# Patient Record
Sex: Male | Born: 1945 | Race: White | Hispanic: No | Marital: Married | Smoking: Current every day smoker
Health system: Southern US, Community
[De-identification: ages and names within clinical notes are randomized; demographics above are authoritative.]

## PROBLEM LIST (undated history)

## (undated) DIAGNOSIS — K219 Gastro-esophageal reflux disease without esophagitis: Secondary | ICD-10-CM

## (undated) DIAGNOSIS — J449 Chronic obstructive pulmonary disease, unspecified: Secondary | ICD-10-CM

## (undated) DIAGNOSIS — C61 Malignant neoplasm of prostate: Secondary | ICD-10-CM

## (undated) DIAGNOSIS — M1990 Unspecified osteoarthritis, unspecified site: Secondary | ICD-10-CM

## (undated) DIAGNOSIS — R892 Abnormal level of other drugs, medicaments and biological substances in specimens from other organs, systems and tissues: Secondary | ICD-10-CM

## (undated) DIAGNOSIS — F17200 Nicotine dependence, unspecified, uncomplicated: Secondary | ICD-10-CM

## (undated) DIAGNOSIS — E785 Hyperlipidemia, unspecified: Secondary | ICD-10-CM

## (undated) DIAGNOSIS — M792 Neuralgia and neuritis, unspecified: Secondary | ICD-10-CM

## (undated) DIAGNOSIS — M5090 Cervical disc disorder, unspecified, unspecified cervical region: Secondary | ICD-10-CM

## (undated) DIAGNOSIS — F329 Major depressive disorder, single episode, unspecified: Secondary | ICD-10-CM

## (undated) DIAGNOSIS — E119 Type 2 diabetes mellitus without complications: Secondary | ICD-10-CM

## (undated) DIAGNOSIS — N529 Male erectile dysfunction, unspecified: Secondary | ICD-10-CM

## (undated) DIAGNOSIS — G4733 Obstructive sleep apnea (adult) (pediatric): Secondary | ICD-10-CM

## (undated) DIAGNOSIS — E1143 Type 2 diabetes mellitus with diabetic autonomic (poly)neuropathy: Secondary | ICD-10-CM

## (undated) DIAGNOSIS — I480 Paroxysmal atrial fibrillation: Secondary | ICD-10-CM

## (undated) DIAGNOSIS — I714 Abdominal aortic aneurysm, without rupture: Secondary | ICD-10-CM

## (undated) DIAGNOSIS — S4991XA Unspecified injury of right shoulder and upper arm, initial encounter: Secondary | ICD-10-CM

## (undated) DIAGNOSIS — E669 Obesity, unspecified: Secondary | ICD-10-CM

## (undated) DIAGNOSIS — M62838 Other muscle spasm: Secondary | ICD-10-CM

## (undated) DIAGNOSIS — F32A Depression, unspecified: Secondary | ICD-10-CM

## (undated) DIAGNOSIS — M7022 Olecranon bursitis, left elbow: Secondary | ICD-10-CM

## (undated) DIAGNOSIS — K760 Fatty (change of) liver, not elsewhere classified: Secondary | ICD-10-CM

## (undated) DIAGNOSIS — R252 Cramp and spasm: Secondary | ICD-10-CM

## (undated) HISTORY — DX: Cramp and spasm: R25.2

## (undated) HISTORY — DX: Unspecified osteoarthritis, unspecified site: M19.90

## (undated) HISTORY — DX: Unspecified injury of right shoulder and upper arm, initial encounter: S49.91XA

## (undated) HISTORY — DX: Neuralgia and neuritis, unspecified: M79.2

## (undated) HISTORY — DX: Cervical disc disorder, unspecified, unspecified cervical region: M50.90

## (undated) HISTORY — DX: Hyperlipidemia, unspecified: E78.5

## (undated) HISTORY — PX: KNEE SURGERY: SHX244

## (undated) HISTORY — DX: Type 2 diabetes mellitus without complications: E11.9

## (undated) HISTORY — DX: Type 2 diabetes mellitus with diabetic autonomic (poly)neuropathy: E11.43

## (undated) HISTORY — DX: Male erectile dysfunction, unspecified: N52.9

## (undated) HISTORY — DX: Gastro-esophageal reflux disease without esophagitis: K21.9

## (undated) HISTORY — DX: Other muscle spasm: M62.838

## (undated) HISTORY — DX: Malignant neoplasm of prostate: C61

## (undated) HISTORY — DX: Obstructive sleep apnea (adult) (pediatric): G47.33

## (undated) HISTORY — DX: Olecranon bursitis, left elbow: M70.22

## (undated) HISTORY — DX: Obesity, unspecified: E66.9

## (undated) HISTORY — DX: Chronic obstructive pulmonary disease, unspecified: J44.9

## (undated) HISTORY — DX: Paroxysmal atrial fibrillation: I48.0

## (undated) HISTORY — DX: Fatty (change of) liver, not elsewhere classified: K76.0

## (undated) HISTORY — DX: Abnormal level of other drugs, medicaments and biological substances in specimens from other organs, systems and tissues: R89.2

## (undated) HISTORY — DX: Abdominal aortic aneurysm, without rupture: I71.4

## (undated) HISTORY — DX: Nicotine dependence, unspecified, uncomplicated: F17.200

## (undated) HISTORY — PX: TONSILLECTOMY: SUR1361

---

## 1999-12-09 HISTORY — PX: CHOLECYSTECTOMY: SHX55

## 2008-04-02 ENCOUNTER — Other Ambulatory Visit: Payer: Self-pay

## 2008-04-02 ENCOUNTER — Emergency Department: Payer: Self-pay | Admitting: Emergency Medicine

## 2010-03-21 ENCOUNTER — Encounter (INDEPENDENT_AMBULATORY_CARE_PROVIDER_SITE_OTHER): Payer: Self-pay | Admitting: Family Medicine

## 2010-03-21 ENCOUNTER — Encounter: Payer: Self-pay | Admitting: Family Medicine

## 2010-03-21 ENCOUNTER — Ambulatory Visit: Payer: Self-pay | Admitting: Family Medicine

## 2010-03-21 DIAGNOSIS — J449 Chronic obstructive pulmonary disease, unspecified: Secondary | ICD-10-CM | POA: Insufficient documentation

## 2010-03-22 ENCOUNTER — Encounter (INDEPENDENT_AMBULATORY_CARE_PROVIDER_SITE_OTHER): Payer: Self-pay | Admitting: Family Medicine

## 2010-03-28 ENCOUNTER — Encounter: Payer: Self-pay | Admitting: Family Medicine

## 2010-03-28 ENCOUNTER — Ambulatory Visit: Payer: Self-pay | Admitting: Family Medicine

## 2010-03-28 DIAGNOSIS — Z720 Tobacco use: Secondary | ICD-10-CM

## 2010-03-28 DIAGNOSIS — Z87448 Personal history of other diseases of urinary system: Secondary | ICD-10-CM | POA: Insufficient documentation

## 2010-03-28 LAB — CONVERTED CEMR LAB
Hemoglobin, Urine: NEGATIVE
Leukocytes, UA: NEGATIVE
Nitrite: NEGATIVE
Protein, ur: NEGATIVE mg/dL
pH: 5.5 (ref 5.0–8.0)

## 2010-03-30 ENCOUNTER — Telehealth: Payer: Self-pay | Admitting: Family Medicine

## 2010-04-21 ENCOUNTER — Ambulatory Visit: Payer: Self-pay | Admitting: Family Medicine

## 2010-04-21 DIAGNOSIS — R252 Cramp and spasm: Secondary | ICD-10-CM

## 2010-04-21 DIAGNOSIS — K760 Fatty (change of) liver, not elsewhere classified: Secondary | ICD-10-CM

## 2010-04-21 DIAGNOSIS — K219 Gastro-esophageal reflux disease without esophagitis: Secondary | ICD-10-CM | POA: Insufficient documentation

## 2010-04-22 ENCOUNTER — Telehealth (INDEPENDENT_AMBULATORY_CARE_PROVIDER_SITE_OTHER): Payer: Self-pay | Admitting: *Deleted

## 2010-05-03 ENCOUNTER — Ambulatory Visit: Payer: Self-pay | Admitting: Family Medicine

## 2010-05-16 LAB — CONVERTED CEMR LAB
ALT: 16 units/L (ref 0–53)
AST: 17 units/L (ref 0–37)
CO2: 24 meq/L (ref 19–32)
Calcium: 9.6 mg/dL (ref 8.4–10.5)
Chloride: 107 meq/L (ref 96–112)
Creatinine, Ser: 0.88 mg/dL (ref 0.40–1.50)
Eosinophils Absolute: 0.3 10*3/uL (ref 0.0–0.7)
Lymphocytes Relative: 19 % (ref 12–46)
Lymphs Abs: 2.7 10*3/uL (ref 0.7–4.0)
Monocytes Relative: 6 % (ref 3–12)
Neutro Abs: 10.2 10*3/uL — ABNORMAL HIGH (ref 1.7–7.7)
Neutrophils Relative %: 73 % (ref 43–77)
PSA: 2.23 ng/mL (ref 0.10–4.00)
Platelets: 248 10*3/uL (ref 150–400)
Potassium: 4.5 meq/L (ref 3.5–5.3)
RBC: 5.55 M/uL (ref 4.22–5.81)
Sodium: 140 meq/L (ref 135–145)
Total CHOL/HDL Ratio: 5.1
Total Protein: 6.2 g/dL (ref 6.0–8.3)
WBC: 13.9 10*3/uL — ABNORMAL HIGH (ref 4.0–10.5)

## 2010-07-14 ENCOUNTER — Ambulatory Visit: Payer: Self-pay | Admitting: Family Medicine

## 2010-07-14 DIAGNOSIS — T310 Burns involving less than 10% of body surface: Secondary | ICD-10-CM | POA: Insufficient documentation

## 2010-09-18 ENCOUNTER — Ambulatory Visit: Payer: Self-pay | Admitting: Family Medicine

## 2010-09-18 ENCOUNTER — Encounter: Payer: Self-pay | Admitting: Cardiovascular Disease

## 2010-09-18 DIAGNOSIS — I4891 Unspecified atrial fibrillation: Secondary | ICD-10-CM | POA: Insufficient documentation

## 2010-09-18 DIAGNOSIS — R209 Unspecified disturbances of skin sensation: Secondary | ICD-10-CM | POA: Insufficient documentation

## 2010-09-19 ENCOUNTER — Ambulatory Visit: Payer: Self-pay | Admitting: Cardiovascular Disease

## 2010-09-19 DIAGNOSIS — E1169 Type 2 diabetes mellitus with other specified complication: Secondary | ICD-10-CM | POA: Insufficient documentation

## 2010-09-19 DIAGNOSIS — E785 Hyperlipidemia, unspecified: Secondary | ICD-10-CM | POA: Insufficient documentation

## 2010-09-19 DIAGNOSIS — R9431 Abnormal electrocardiogram [ECG] [EKG]: Secondary | ICD-10-CM | POA: Insufficient documentation

## 2010-09-20 ENCOUNTER — Encounter: Payer: Self-pay | Admitting: Family Medicine

## 2010-09-24 ENCOUNTER — Telehealth (INDEPENDENT_AMBULATORY_CARE_PROVIDER_SITE_OTHER): Payer: Self-pay | Admitting: Internal Medicine

## 2010-09-25 ENCOUNTER — Telehealth: Payer: Self-pay | Admitting: Family Medicine

## 2010-10-12 ENCOUNTER — Telehealth: Payer: Self-pay | Admitting: Family Medicine

## 2010-10-14 ENCOUNTER — Telehealth: Payer: Self-pay | Admitting: Cardiovascular Disease

## 2010-10-15 ENCOUNTER — Encounter: Payer: Self-pay | Admitting: Cardiovascular Disease

## 2010-10-16 ENCOUNTER — Telehealth: Payer: Self-pay | Admitting: Cardiology

## 2010-10-17 ENCOUNTER — Ambulatory Visit: Payer: Self-pay | Admitting: Family Medicine

## 2010-10-17 DIAGNOSIS — J984 Other disorders of lung: Secondary | ICD-10-CM | POA: Insufficient documentation

## 2010-10-17 DIAGNOSIS — I10 Essential (primary) hypertension: Secondary | ICD-10-CM

## 2010-10-17 DIAGNOSIS — G4700 Insomnia, unspecified: Secondary | ICD-10-CM | POA: Insufficient documentation

## 2010-10-18 ENCOUNTER — Telehealth (INDEPENDENT_AMBULATORY_CARE_PROVIDER_SITE_OTHER): Payer: Self-pay | Admitting: *Deleted

## 2010-10-21 ENCOUNTER — Encounter: Payer: Self-pay | Admitting: Cardiovascular Disease

## 2010-10-22 ENCOUNTER — Telehealth: Payer: Self-pay | Admitting: Cardiovascular Disease

## 2010-10-29 ENCOUNTER — Ambulatory Visit: Payer: Self-pay | Admitting: Family Medicine

## 2010-11-05 ENCOUNTER — Encounter: Payer: Self-pay | Admitting: Family Medicine

## 2010-11-06 ENCOUNTER — Ambulatory Visit: Payer: Self-pay | Admitting: Cardiovascular Disease

## 2010-11-06 ENCOUNTER — Telehealth: Payer: Self-pay | Admitting: Cardiovascular Disease

## 2010-11-06 ENCOUNTER — Ambulatory Visit: Payer: Self-pay | Admitting: Family Medicine

## 2010-11-11 ENCOUNTER — Telehealth: Payer: Self-pay | Admitting: Cardiovascular Disease

## 2010-11-15 ENCOUNTER — Ambulatory Visit: Payer: Self-pay | Admitting: Family Medicine

## 2010-11-18 ENCOUNTER — Telehealth: Payer: Self-pay | Admitting: Cardiovascular Disease

## 2010-11-21 ENCOUNTER — Telehealth (INDEPENDENT_AMBULATORY_CARE_PROVIDER_SITE_OTHER): Payer: Self-pay | Admitting: *Deleted

## 2010-11-25 ENCOUNTER — Encounter: Payer: Self-pay | Admitting: Cardiovascular Disease

## 2010-11-28 ENCOUNTER — Ambulatory Visit: Payer: Self-pay | Admitting: Family Medicine

## 2010-11-28 ENCOUNTER — Encounter: Payer: Self-pay | Admitting: Cardiovascular Disease

## 2010-11-28 ENCOUNTER — Ambulatory Visit: Payer: Self-pay | Admitting: Cardiovascular Disease

## 2010-11-28 DIAGNOSIS — Z9119 Patient's noncompliance with other medical treatment and regimen: Secondary | ICD-10-CM

## 2010-11-29 ENCOUNTER — Encounter: Payer: Self-pay | Admitting: Family Medicine

## 2010-11-29 ENCOUNTER — Encounter: Payer: Self-pay | Admitting: Cardiovascular Disease

## 2010-11-29 ENCOUNTER — Telehealth (INDEPENDENT_AMBULATORY_CARE_PROVIDER_SITE_OTHER): Payer: Self-pay | Admitting: *Deleted

## 2010-11-29 ENCOUNTER — Ambulatory Visit: Payer: Self-pay | Admitting: Family Medicine

## 2010-11-30 ENCOUNTER — Encounter: Payer: Self-pay | Admitting: Family Medicine

## 2010-11-30 LAB — CONVERTED CEMR LAB
ALT: 19 units/L (ref 0–53)
AST: 18 units/L (ref 0–37)
Basophils Absolute: 0 10*3/uL (ref 0.0–0.1)
Basophils Relative: 0 % (ref 0–1)
Chloride: 103 meq/L (ref 96–112)
Creatinine, Ser: 1.01 mg/dL (ref 0.40–1.50)
Eosinophils Absolute: 0.4 10*3/uL (ref 0.0–0.7)
Eosinophils Relative: 3 % (ref 0–5)
HCT: 47.9 % (ref 39.0–52.0)
MCV: 91.9 fL (ref 78.0–100.0)
Platelets: 237 10*3/uL (ref 150–400)
RDW: 13.2 % (ref 11.5–15.5)
Sed Rate: 1 mm/hr (ref 0–16)
Sodium: 140 meq/L (ref 135–145)
Total Bilirubin: 0.5 mg/dL (ref 0.3–1.2)

## 2010-12-04 ENCOUNTER — Encounter: Payer: Self-pay | Admitting: Family Medicine

## 2010-12-04 ENCOUNTER — Ambulatory Visit
Admission: RE | Admit: 2010-12-04 | Discharge: 2010-12-04 | Payer: Self-pay | Source: Home / Self Care | Attending: Family Medicine | Admitting: Family Medicine

## 2010-12-04 DIAGNOSIS — D72829 Elevated white blood cell count, unspecified: Secondary | ICD-10-CM | POA: Insufficient documentation

## 2010-12-05 ENCOUNTER — Telehealth: Payer: Self-pay | Admitting: Family Medicine

## 2010-12-08 HISTORY — PX: OTHER SURGICAL HISTORY: SHX169

## 2010-12-09 ENCOUNTER — Telehealth: Payer: Self-pay | Admitting: Adult Health

## 2010-12-23 ENCOUNTER — Ambulatory Visit
Admission: RE | Admit: 2010-12-23 | Discharge: 2010-12-23 | Payer: Self-pay | Source: Home / Self Care | Attending: Family Medicine | Admitting: Family Medicine

## 2010-12-23 ENCOUNTER — Encounter: Payer: Self-pay | Admitting: Family Medicine

## 2010-12-23 ENCOUNTER — Encounter (INDEPENDENT_AMBULATORY_CARE_PROVIDER_SITE_OTHER): Payer: Self-pay | Admitting: *Deleted

## 2010-12-30 ENCOUNTER — Encounter: Payer: Self-pay | Admitting: Family Medicine

## 2010-12-30 ENCOUNTER — Ambulatory Visit
Admission: RE | Admit: 2010-12-30 | Discharge: 2010-12-30 | Payer: Self-pay | Source: Home / Self Care | Attending: Family Medicine | Admitting: Family Medicine

## 2010-12-30 ENCOUNTER — Ambulatory Visit
Admission: RE | Admit: 2010-12-30 | Discharge: 2010-12-30 | Payer: Self-pay | Source: Home / Self Care | Attending: Cardiology | Admitting: Cardiology

## 2010-12-30 ENCOUNTER — Encounter: Payer: Self-pay | Admitting: Cardiology

## 2010-12-30 DIAGNOSIS — R0609 Other forms of dyspnea: Secondary | ICD-10-CM | POA: Insufficient documentation

## 2010-12-31 ENCOUNTER — Ambulatory Visit: Admission: RE | Admit: 2010-12-31 | Discharge: 2010-12-31 | Payer: Self-pay | Source: Home / Self Care

## 2010-12-31 ENCOUNTER — Other Ambulatory Visit: Payer: Self-pay | Admitting: Cardiovascular Disease

## 2011-01-01 ENCOUNTER — Telehealth (INDEPENDENT_AMBULATORY_CARE_PROVIDER_SITE_OTHER): Payer: Self-pay

## 2011-01-06 ENCOUNTER — Telehealth (INDEPENDENT_AMBULATORY_CARE_PROVIDER_SITE_OTHER): Payer: Self-pay | Admitting: Radiology

## 2011-01-06 LAB — PULMONARY FUNCTION TEST

## 2011-01-07 ENCOUNTER — Encounter (HOSPITAL_COMMUNITY)
Admission: RE | Admit: 2011-01-07 | Discharge: 2011-01-07 | Payer: Self-pay | Source: Home / Self Care | Attending: Cardiology | Admitting: Cardiology

## 2011-01-07 ENCOUNTER — Telehealth: Payer: Self-pay | Admitting: Cardiovascular Disease

## 2011-01-07 ENCOUNTER — Ambulatory Visit
Admission: RE | Admit: 2011-01-07 | Discharge: 2011-01-07 | Payer: Self-pay | Source: Home / Self Care | Attending: Cardiology | Admitting: Cardiology

## 2011-01-07 ENCOUNTER — Encounter: Payer: Self-pay | Admitting: Cardiology

## 2011-01-07 NOTE — Progress Notes (Signed)
Summary: refill soma and doxycyline  Phone Note Call from Patient   Caller: Patient Call For: Dr. Laural Benes Reason for Call: Refill Medication Summary of Call: Called from [employer] Number; [phone] Wants refill of Carisoprodol 350 mg 60 at a time// and doxycyline 100 mg gets #20 and combivent inhaler Initial call taken by: Providence Crosby LPN,  October 12, 2010 11:36 AM  Follow-up for Phone Call        Please call patient to find out why he is calling for these medications and not being seen for an office visit.  I don't normally call in any prescriptions for antibiotics or controlled substances without the patient having been seen. We can call in the combivent for him but he would need to be seen for the others.  I had told the patient that I didn't feel comfortable prescribing him Tresa Garter as an ongoing prescription.  He will need to find another clinic if he is expecting that we will keep prescribing these controlled substances. [name], M.D., F.A.A.F.P.  October 12, 2010   Additional Follow-up for Phone Call Additional follow up Details #1::        I Called patient and let him know he had to come in to be seen for abx. and controlled medication refills  States I do not want to see that doctor I will call Monday and get it done with my doctor!!! Additional Follow-up by: Providence Crosby LPN,  October 12, 2010 12:22 PM    Pt should have at least 3 refills remaining on his combivent prescription.  On 09/18/10 He was prescribed 4 refills.  If he needs further antibiotics, I strongly recommend that he come in for an office visit evaluation.  In addition, for a prescription for Soma, He needs to be evaluated for the reason that he needs to be taking this medication regularly.  We have no records or diagnostic tests to support him using this medication chronically.  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

## 2011-01-07 NOTE — Progress Notes (Signed)
Summary: EVENT MONITOR  Phone Note Call from Patient Call back at Home Phone [phone] Call back at [phone]   Caller: WIFE ([name]) Call For: GOLLAN Summary of Call: CALLING ABOUT GETTING AN EVENT MONITOR FOR THE PT-STATES THAT SHE SPOKE WITH SOMEONE LAST WEEK REGARDING THIS AND HAS NOT HEARD ANYTHING BACK-I DO NOT SEE A PHONE NOTE ON THIS-PT HAS MEDICARE ONLY Initial call taken by: Harlon Flor,  October 14, 2010 10:26 AM  Follow-up for Phone Call        Called spoke with pt's wife, advised event monitor has been ordered on 10/07/10, still pending approval in [employer].  Follow-up by: Cloyde Reams RN,  October 14, 2010 11:47 AM

## 2011-01-07 NOTE — Letter (Signed)
Summary: Handout Printed  Printed Handout:  - Tobacco Quit Plan

## 2011-01-07 NOTE — Progress Notes (Signed)
Summary: Afib with RVR  Phone Note Outgoing Call Call back at [phone]   Call placed by: Cloyde Reams RN,  October 22, 2010 5:01 PM Call placed to: Patient Summary of Call: Attempted to call pt had afib with RVR on 10/19/10, [employer] faxed transmissions.  Pt was started on Diltiazem 120mg  once daily on 10/16/10 for afib with RVR.  Per EMR note on 10/17/10 pt stated he was not going to take the Diltiazem, but his primary MD encouraged him to follow Dr Alford Highland recommendations.  Unsure if pt is taking rx Diltiazem or not.  Attempted to call pt to inquire.  LMOM TCB.  Initial call taken by: Cloyde Reams RN,  October 22, 2010 5:06 PM  Follow-up for Phone Call        Called spoke with pt.  Pt states he is taking the Diltiazem 120mg  daily and he has not missed any dosages.  Pt states on Saturday he knew his HR was elevated, because he was diaphoretic for no reason.  Follow-up by: Cloyde Reams RN,  October 23, 2010 4:06 PM  Additional Follow-up for Phone Call Additional follow up Details #1::        Pt called back, [employer] monitor broke and pt is in [place] at this time.  He will return on Tuesday 10/29/10, [employer] is mailing out another monitor and pt will resume wearing once he gets back to town and can put the new monitor back on.  Additional Follow-up by: Cloyde Reams RN,  October 24, 2010 10:33 AM

## 2011-01-07 NOTE — Progress Notes (Signed)
  Phone Note Call from Patient   Reason for Call: Refill Medication Action Taken: Phone Call Completed Details of Complaint: persistent "cold" Summary of Call: Patient wants a refill of doxycycline but the present doxy day 7 of 10, doesn't seem to be working. I offered to see the patient today. patient declined. Says he's going out on a road trip later today and will consider calling Dr. Laural Benes tomorrow.

## 2011-01-07 NOTE — Assessment & Plan Note (Signed)
Vital Signs:  Patient Profile:   65 Years Old Male CC:      Wheezing and right side pain./ RWT Height:     72.5 inches Weight:      267 pounds BMI:     35.84 Temp:     98.0 degrees F oral Pulse rate:   74 / minute Pulse rhythm:   regular Resp:     22 per minute BP sitting:   110 / 78  (right arm)  Vitals Entered By: Levonne Spiller EMT-P (March 21, 2010 11:48 AM)             Is Patient Diabetic? No Comments Pt. Is a smoker.1 pack per day.      Current Allergies: No known allergies History of Present Illness Chief Complaint: Wheezing and right side pain./ RWT History of Present Illness: Patient presents today with complaints of pain on his right side since GB surgery 4 years ago. The pain is described as soreness or tightning. Notices it with deep breaths, drinking through a straw and while cigarette smoking. Sometimes felt when he turns or moves quickly. Sometimes felt with exhertion. He has a chronic cough sometimes of green sputum. No f/c or nightsweats. + snoring, no witnessed apnea.  He has no regular doctor and gets no regular care. He was working in the garden yesterday and became short of breath and had soreness. Used his inhaler that he had from years ago and felt better. He reports previous dx with emphysema. No history of known heart dz.   REVIEW OF SYSTEMS Constitutional Symptoms       Complains of fatigue.     Denies fever, chills, night sweats, weight loss, and weight gain.  Eyes       Complains of glasses.      Denies change in vision, eye pain, eye discharge, and contact lenses.      Comments: reading glasses Ear/Nose/Throat/Mouth       Complains of sinus problems.      Denies hearing loss/aids, change in hearing, ear pain, ear discharge, dizziness, frequent runny nose, and frequent nose bleeds.  Respiratory       Complains of productive cough, wheezing, shortness of breath, and emphysema/COPD.      Denies dry cough, asthma, and bronchitis.  Cardiovascular    Complains of chest pain and tires easily with exhertion.      Denies murmurs.    Gastrointestinal       Complains of stomach pain.      Denies nausea/vomiting, diarrhea, constipation, and blood in bowel movements. Genitourniary       Denies painful urination, blood or discharge from penis, kidney stones, and loss of urinary control. Neurological       Denies paralysis, seizures, and fainting/blackouts. Musculoskeletal       Complains of joint stiffness.      Denies joint pain, decreased range of motion, redness, swelling, muscle weakness, and gout.      Comments: + cramping  Psych       Complains of sleep problems. Other Comments: urinary urgency, nocturia, no hesitancy, dribbling or decreased stream.   Past History:  Past Surgical History: Knee surgery Cholecystectomy Tonsillectomy  Family History: Father died of Leukemia had heart and lung probs but never smoked or drank sister died of melanoma  2 brothers of bad health  Social History: Married Current Smoker Occupation: Long distance Naval architect Smoking Status:  current Physical Exam General appearance: obese, well developed, well nourished, no acute  distress Head: normocephalic, atraumatic Oral/Pharynx: tongue normal, posterior pharynx without erythema or exudate Neck: neck supple,  trachea midline, no masses Chest/Lungs: coarse bs throughout with wheezes on right - improved aeration after nebulizer Heart: regular rate and  rhythm, no murmur Abdomen: protuberant, NABS, slightly enlarged liver. +tender to palp along right lower rib cage along axillary line with some swelling in the area of pain, no ecchymosis Extremities: 1+ to trace pitting edema bilateral legs to knees.  Neurological: grossly intact and non-focal Back: no tenderness over musculature, straight leg raises negative bilaterally, deep tendon reflexes 2+ at achilles and patella Skin: no obvious rashes or lesions Assessment New Problems: FECAL URGENCY  (ICD-787.63) URINARY URGENCY (ICD-788.63) FLANK PAIN, RIGHT (ICD-789.09) COPD (ICD-496)   Plan New Medications/Changes: PREDNISONE (PAK) 10 MG TABS (PREDNISONE) as directed  #1 x 0, 03/21/2010, Tacey Ruiz MD COMBIVENT 18-103 MCG/ACT AERO (IPRATROPIUM-ALBUTEROL) 2 puffs by mouth three times a day as needed shortness of breath/wheezing  #1 x 3, 03/21/2010, Tacey Ruiz MD  New Orders: New Patient Level III [99203] T-CBC w/Diff [16109-60454] T-Comprehensive Metabolic Panel [80053-22900] T-PSA [09811-91478] T-Lipid Profile [29562-13086] T-Venipuncture [57846] T- Specimen Handling [99000] Abdomen x-ray complete, 2 view [74020] T-DG Ribs Unilateral w/ PA Chest [71101] T-DG ABD 2 Views [74020] Nebulizer- [facility] [96295]  The patient and/or caregiver has been counseled thoroughly with regard to medications prescribed including dosage, schedule, interactions, rationale for use, and possible side effects and they verbalize understanding.  Diagnoses and expected course of recovery discussed and will return if not improved as expected or if the condition worsens. Patient and/or caregiver verbalized understanding.  Prescriptions: PREDNISONE (PAK) 10 MG TABS (PREDNISONE) as directed  #1 x 0   Entered and Authorized by:   Tacey Ruiz MD   Signed by:   Tacey Ruiz MD on 03/21/2010   Method used:   Electronically to        [facility] [address] (retail)       [address], [address]       [place], [place]  [address]       Ph: [phone]       Fax: [phone]   RxID:   [id number] COMBIVENT 18-103 MCG/ACT AERO (IPRATROPIUM-ALBUTEROL) 2 puffs by mouth three times a day as needed shortness of breath/wheezing  #1 x 3   Entered and Authorized by:   Tacey Ruiz MD   Signed by:   Tacey Ruiz MD on 03/21/2010   Method used:   Electronically to        [facility] [address] (retail)       [address], [address]       [place], [place]   [address]       Ph: [phone]       Fax: [phone]   RxID:   [id number]   ____________________________________________________________________ I have reviewed the above medical office visit documention, including diagnoses, history, medications, clinical lists, orders and plan of care.   [name], MD, FAAFP  March 21, 2010      [name], M.D., F.A.A.F.P.  March 21, 2010   Appended Document: Medications   Patient returned with his medication list. He was previously on Advair. His labs were done and revealed an elevated WBC count. He reports feeling much better. His xrays are still pending.     Current Allergies: No known allergies  Prescriptions: ADVAIR DISKUS  500-50 MCG/DOSE AEPB (FLUTICASONE-SALMETEROL) 1 puff by mouth two times a day  #1 x 3   Entered and Authorized by:   Tacey Ruiz MD   Signed by:   Tacey Ruiz MD on 03/22/2010   Method used:   Print then Give to Patient   RxID:   [id number] DUONEB 0.5-2.5 (3) MG/3ML SOLN (IPRATROPIUM-ALBUTEROL) To use in nebulizer two times a day as needed  #30 x 2   Entered and Authorized by:   Tacey Ruiz MD   Signed by:   Tacey Ruiz MD on 03/22/2010   Method used:   Print then Give to Patient   RxID:   [id number] NEBULIZER  MISC (NEBULIZERS) to use as directed  #1 x 0   Entered and Authorized by:   Tacey Ruiz MD   Signed by:   Tacey Ruiz MD on 03/22/2010   Method used:   Print then Give to Patient   RxID:   [id number] NEBULIZER  MISC (NEBULIZERS) to use as directed  #1 x 0   Entered and Authorized by:   Tacey Ruiz MD   Signed by:   Tacey Ruiz MD on 03/22/2010   Method used:   Print then Give to Patient   RxID:   [id number]   Patient Instructions: 1)  Do not use combivent inhaler and nebulizer together. Spread them at least 6 hours apart. Take the Advair everyday, twice daily. We will await xray results and call with results. [phone]. 2)  Tobacco is very bad for your health and your loved  ones! You Should stop smoking!Marland Kitchen

## 2011-01-07 NOTE — Progress Notes (Signed)
  Phone Note Call from Patient   Summary of Call: Patient called and stated that Dr. Laural Benes had forgotten to call one of his prescriptions in to the drugstore.  He did not know the name of the medication but stated it was for his nerves and nerve endings in his fingers??  He would like Dr. Laural Benes to call this prescription into the drugstore. Initial call taken by: Rosine Beat,  October 18, 2010 11:20 AM  Follow-up for Phone Call        I called [name] and explained that there were 4 refills on the medications that he was asking about.  I explained all he needed to do was tell the pharmacy and they would refill it for him.  He voiced understanding. Follow-up by: Rosine Beat,  October 18, 2010 11:35 AM

## 2011-01-07 NOTE — Progress Notes (Signed)
Summary: BP  Phone Note Call from Patient Call back at [phone]   Caller: Self Call For: [name] Summary of Call: BP is all over the place since taking the medication.  BP right now is 164/117 Pulse 105. Initial call taken by: Harlon Flor,  November 11, 2010 1:36 PM  Follow-up for Phone Call        [employer] contacted office for A fib with RVR of 150, we contacted pt, he is asymptomatic. Pt is taking Amiodarone 200mg  two times a day, and Pradaxa as prescribed. Pt is not currently taking Diltiazem as needed becuase he is asymptomatic. Discussed with Dr. Mariah Milling, he recommends starting Metoprolol Succinate 25mg  once daily, pt notified and advised Metoprolol will help control BP as well as HR. Rx sent to [facility] pharmacy, pt will have rx transferred to current location. Pt will continue to monitor BP and HR and notifiy office if abnormal. Follow-up by: Lanny Hurst RN,  November 11, 2010 2:33 PM    New/Updated Medications: METOPROLOL SUCCINATE 25 MG XR24H-TAB (METOPROLOL SUCCINATE) Take one tablet by mouth daily Prescriptions: METOPROLOL SUCCINATE 25 MG XR24H-TAB (METOPROLOL SUCCINATE) Take one tablet by mouth daily  #30 x 6   Entered by:   Lanny Hurst RN   Authorized by:   Dossie Arbour MD   Signed by:   Lanny Hurst RN on 11/11/2010   Method used:   Telephoned to ...       [facility] [address] ([facility]ail)       [address], [address]       [place], [place]  [address]       Ph: [phone]       Fax: [phone]   RxID:   [id number]
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
## 2011-01-07 NOTE — Progress Notes (Signed)
Summary: Diarrhea  Phone Note Call from Patient   Caller: Patient Reason for Call: Talk to Doctor Details for Reason: The patient was seen Sunday at the clinic and had a follow up visit today but called and said he could not make it because of diarrhea.  Want to know if the doctor could call him in some more medication (the patient unclear what type, possibly an antibiotic).  Initial call taken by: Chelsea Aus,  Apr 22, 2010 11:24 AM  Follow-up for Phone Call        Patient reports that since starting the prescribed medications he developed diarrhea. He reports however, since the diarrhea started he has had no pain and the hardness has disappeared from his right upper quadrant.  I advised him to hold on the magnesium to see if the diarrhea resolves. If it does not then he may need to take otc Immodium for relief. He was cautioned regarding possible resulting constipation. He may then resume Magnesium after resolution of the diarrhea, taking only 1 daily and increasing to 1 twice daily after 2-3 days if tolerated.   He also asked for an Abx, but despite not knowing why he wanted this, I told him that it would probably make his diarrhea worse.   Patient voiced understanding.  Follow-up by: Tacey Ruiz MD,  Apr 22, 2010 11:56 AM    The risks, benefits and possible side effects were clearly explained and discussed with the patient.  The patient verbalized clear understanding.  The patient was given instructions to return if symptoms don't improve, worsen or new changes develop.  If it is not during clinic hours and the patient cannot get back to this clinic then the patient was told to seek medical care at an available urgent care or emergency department.  The patient verbalized understanding.     The patient was informed that there is no on-call provider or services available at this clinic during off-hours (when the clinic is closed).  If the patient developed a problem or concern that  required immediate attention, the patient was advised to go the the nearest available urgent care or emergency department for medical care.  The patient verbalized understanding.     I have reviewed the above  documention, including diagnoses, history, medications, clinical lists, orders and plan of care.   [name], MD, FAAFP  Apr 22, 2010

## 2011-01-07 NOTE — Progress Notes (Signed)
Summary: severe abdominal pain  Phone Note Call from Patient   Caller: Patient Reason for Call: Talk to Doctor Complaint: Abdominal Pain, Urinary/GYN Problems Action Taken: Phone Call Completed, Patient advised to go to ER Details for Reason: Pt wanted to speak to Dr.  Details of Complaint: Pt reported that last night he experienced severe abdominal pain. No further blood in urine seen.  Pt reports that he is in [place] (10 hours away).  Details of Action Taken: Pt advised to seek medical care in [place] and to not try to drive back here in pain.   Summary of Call: the patient called from [place] stating that he woke up yesterday morning with severe abdominal pain in the right upper quadrant. He reports that he's been having this pain for the last 4-5 years. He reports that the pain was severe this time. The patient reports that he had a cholecystectomy done several years ago. The patient reports that he has not experienced any further blood in the urine. The patient reports that the pain subsided after several hours. The patient reports that they abdominal pain persists but is not as severe as it was initially in the early morning hours. The patient reports that he is concerned that he may have a kidney stone. I told the patient that it was my medical advice that he go and see medical care in [place] and to not [place] try to drive [employer]. I told the patient and I was not sure what his condition was. He did have a kidney stone and he did also have many other conditions that cause abdominal pain. I told the patient that he needed to be evaluated for this because it was such a severe attack. The patient verbalized understanding. I told the patient that he can go to the nearest emergency department to be evaluated for this abdominal pain. The patient reports that he is taking the antibiotics and I also notified and the results of the preliminary urine culture that was taken our office at his last  visit. I also nature the patient understood that this clinic is not a primary care clinic and that he would be best served by obtaining and following a regular primary care provider for his medical care. I told him that having different physicians here at different times compromise as the continuity of the care that he would receive.  He would be best served by hitting his own primary care provider and following with him on a regular basis. The patient reports that he has been suffering with this chronic abdominal pain for approximately 4-5 years. He reports that it is now back at its baseline. [name], M.D., F.A.A.F.P. Initial call taken by: Chelsea Aus,  March 30, 2010 4:28 PM  Follow-up for Phone Call        I advised the patient to go to a local emergency department to be evaluated and treated for his abdominal pain. I told the patient that it was not wise for him to try and driveback to [place] with this abdominal pain given that he became very severe this morning. I told the patient that this clinic was not equipped to be primary care clinic, urgent care or emergency department. The patient verbalized understanding. Phone Call Completed, Patient advised to go to ER [name], M.D., F.A.A.F.P.  March 30, 2010  Follow-up by: the patient was given instructions to seek primary care and to immediately go to the local emergency department  for treatment.    _______________________________________________________________________ As mentioned above, the patient was given instructions to obtain and folic primary care provider. Immediately he was requested to go to emergency department as he medical care for the severe abdominal pain episode that he called and described to [place].   I explained to the patient that we did not have a diagnosis that was clear at this time and that he needed to be evaluated before he down the road and tried to drive back to [place] which is more than 10  hours away.   I told the patient to be seen by provider in [place] where he is now and to have the appropriate testing and treatment. I also told the patient that I would feel more comfortable if the patient was evaluated locally so that we could be sure that it was safe for him to drive a long distance. The patient verbalized understanding.  [name], M.D., F.A.A.F.P.  March 30, 2010

## 2011-01-07 NOTE — Assessment & Plan Note (Signed)
Summary: LAB/AMD  Nurse Visit   Vital Signs:  Patient profile:   65 year old male Height:      70.5 inches Pulse rate:   93 / minute BP sitting:   100 / 72  (left arm) Cuff size:   large  Vitals Entered By: Bishop Dublin, CMA (November 06, 2010 5:01 PM) CC: Having a lot of shortness of breath and fatigue. Comments Patient came to office and had an EKG.  The patient started on Pradaxa and amiodarone.  Patient will continue to wear event monitor and we will access heart rate.Bishop Dublin, CMA  November 08, 2010 3:50 PM    Visit Type:  Nurse visit  CC:  Having a lot of shortness of breath and fatigue..   Allergies: No Known Drug Allergies

## 2011-01-07 NOTE — Medication Information (Signed)
Summary: refill for medications  refill for medications   Imported By: Rosine Beat 11/05/2010 16:46:00  _____________________________________________________________________  External Attachment:    Type:   Image     Comment:   External Document

## 2011-01-07 NOTE — Miscellaneous (Signed)
  Clinical Lists Changes  Medications: Added new medication of NEURONTIN 300 MG CAPS (GABAPENTIN) take 1 by mouth two times a day as needed nerve pain - Signed Rx of NEURONTIN 300 MG CAPS (GABAPENTIN) take 1 by mouth two times a day as needed nerve pain;  #60 x 3;  Signed;  Entered by: Standley Dakins MD;  Authorized by: Standley Dakins MD;  Method used: Telephoned to [facility]  [address], [place], [place], [place]  [address], Ph: [phone], Fax: [phone]    Prescriptions: NEURONTIN 300 MG CAPS (GABAPENTIN) take 1 by mouth two times a day as needed nerve pain  #60 x 3   Entered and Authorized by:   Standley Dakins MD   Signed by:   Standley Dakins MD on 09/20/2010   Method used:   Telephoned to ...       [facility]  [address] (retail)       [address], [address]       [place], [place]  [address]       Ph: [phone]       Fax: [phone]   RxID:   [id number]

## 2011-01-07 NOTE — Progress Notes (Signed)
Summary: [facility]  Phone Note Other Incoming Call back at [phone]   Caller: [facility] Selena Batten) Summary of Call: WOULD LIKE A CALL BACK Initial call taken by: Harlon Flor,  October 16, 2010 4:06 PM  Follow-up for Phone Call        Spoke with [facility] per Dr. Shirlee Latch we will contact patient in regards to heart rate.  Told if he is symptomatic with rapid heart beats to send him to nearest ER. We suggested him come to the office tomorrow to see Dr. 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  Has not had any symptoms now or at all today. Follow-up by: Bishop Dublin, CMA,  October 16, 2010 5:09 PM  Additional Follow-up for Phone Call Additional follow up Details #1::        Per Dr. Shirlee Latch have the patient pick up a Rx. for Diltiazem CD 120 mg take one tablet daily. The patient was instructed to stop the Diltiazem if he feels lightheaded. Additional Follow-up by: Bishop Dublin, CMA,  October 16, 2010 5:11 PM    New/Updated Medications: DILT-CD 120 MG XR24H-CAP (DILTIAZEM HCL COATED BEADS) one tablet once daily

## 2011-01-07 NOTE — Letter (Signed)
Summary: Abnormal Spirometry Report  Abnormal Spirometry Report   Imported By: Dorna Leitz 10/17/2010 16:58:30  _____________________________________________________________________  External Attachment:    Type:   Image     Comment:   External Document

## 2011-01-07 NOTE — Assessment & Plan Note (Signed)
Summary: BREATHING ISSUES/EVM   Vital Signs:  Patient profile:   65 year old male Height:      71 inches Weight:      271 pounds BMI:     37.93 O2 Sat:      93 % on Room air Temp:     97.2 degrees F oral Pulse rate:   58 / minute Pulse rhythm:   regular Resp:     22 per minute BP sitting:   131 / 81  (right arm)  Vitals Entered By: Levonne Spiller EMT-P (May 03, 2010 12:07 PM)  O2 Flow:  Room air Assessment  Assessed COPD as deteriorated - Standley Dakins MD Assessed ABDOMINAL CRAMPS as unchanged - Standley Dakins MD  Patient Education: Patient and/or caregiver instructed in the following: quit smoking. Demonstrates unwillingness to comply.  Plan New Medications/Changes: DUONEB 0.5-2.5 (3) MG/3ML SOLN (IPRATROPIUM-ALBUTEROL) To use in nebulizer two times a day as needed  #2 boxes x 2, 05/03/2010, Tacey Ruiz MD DUONEB 0.5-2.5 (3) MG/3ML SOLN (IPRATROPIUM-ALBUTEROL) To use in nebulizer two times a day as needed  #2 boxes x 3, 05/03/2010, Tacey Ruiz MD PREDNISONE 20 MG TABS (PREDNISONE) 1 by mouth two times a day  #10 x 0, 05/03/2010, Tacey Ruiz MD AVELOX 400 MG TABS (MOXIFLOXACIN HCL) 1 by mouth daily for infection  #10 x 0, 05/03/2010, Tacey Ruiz MD  New Orders: Est. Patient Level III 564 005 [id number] CT Scan with & without contrast [CT w/& w/o contrast] T-CMP with estimated GFR [[id number]] CBC w/Diff [[id number]] Planning Comments:   patient again urged to obtain a primary care physician but he refused and wanted to continue to get care here.  I believe that he needs to have a CT scan of his chest performed, but as urgent/retail care we are unable to aurthorize this test. Pt to be notified of this. He is to return to clinic in 1 month for repeat cbc, and cmp.  Follow Up: Follow up with Primary Physician  The patient and/or caregiver has been counseled thoroughly with regard to medications prescribed including dosage, schedule, interactions, rationale for use, and possible side  effects and they verbalize understanding.  Diagnoses and expected course of recovery discussed and will return if not improved as expected or if the condition worsens. Patient and/or caregiver verbalized understanding.    History of Present Illness History from: patient History of Present Illness: Reports that he is feeling a lot better - able to take deeper breaths and no pain in the right flank or productive cough as long as he is taking his nebulizer regularly. He has not used it over the last 2 days as he has run out.   He continues to have cramps in his right and left flanks, legs and buttocks. He is taking the magnesium and has had slight improvement.  He   He refuses to see a primary care physician.  He hasn't seen one for 5 years. I informed him that there may be things that are missed in this setting, but he reports that he has a demanding job that involves a lot of travel. He says that he is unable to make appointments that are too far out as he doesn't know where he will be.        REVIEW OF SYSTEMS Constitutional Symptoms      Denies fever, chills, and night sweats.  Ear/Nose/Throat/Mouth       Denies dizziness, sinus problems, sore throat, and hoarseness.  Respiratory  Complains of dry cough, productive cough, wheezing, shortness of breath, and emphysema/COPD.      Denies asthma and bronchitis.  Cardiovascular       Complains of chest pain and tires easily with exhertion.      Denies murmurs.      Comments: cramping right sided   Gastrointestinal       Complains of stomach pain.      Denies nausea/vomiting, diarrhea, constipation, blood in bowel movements, and indigestion. Genitourniary       Denies painful urination, blood or discharge from penis, and kidney stones.   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 of Uti  (ICD-599.0) 6)   Leukocytosis Unspecified  (ICD-288.60) 7)  ? 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 of Uti  (ICD-599.0) 6)  Leukocytosis Unspecified  (ICD-288.60) 7)  ? of Nephrolithiasis  (ICD-592.0) 8)  Cigarette Smoker  (ICD-305.1) 9)  Hematuria, Hx of  (ICD-V13.09) 10)  Gross Hematuria  (ICD-599.71) 11)  Fecal Urgency  (ICD-787.63) 12)  Urinary Urgency  (ICD-788.63) 13)  Flank Pain, Right  (ICD-789.09) 14)  COPD  (ICD-496)  Medications Prior to Update: 1)  Aspir-Low 81 Mg Tbec (Aspirin) 2)  Nebulizer  Misc (Nebulizers) .... To Use As Directed 3)  Duoneb 0.5-2.5 (3) Mg/2ml Soln (Ipratropium-Albuterol) .... To Use in Nebulizer Two Times A Day As Needed 4)  Mag-Ox 400 400 Mg Tabs (Magnesium Oxide) .... Sig 2 Tablets By Mouth 1-2 Times Aday As Needed For Cramps  Reduce Dosage As Cramps Go Away 5)  Zegerid 40-1100 Mg Caps (Omeprazole-Sodium Bicarbonate) .Marland Kitchen.. 1 By Mouth Daily 6)  Ranitidine Hcl 300 Mg Tabs (Ranitidine Hcl) .... Sig 1 Tablet Po Once Daily and May Occasional Take A 2nd Dose  Allergies (verified): No Known Drug Allergies   Complete Medication List: 1)  Aspir-low 81 Mg Tbec (Aspirin) 2)  Nebulizer Misc (Nebulizers) .... To use as directed 3)  Duoneb 0.5-2.5 (3) Mg/71ml Soln (Ipratropium-albuterol) .... To use in nebulizer two times a day as needed 4)  Mag-ox 400 400 Mg Tabs (Magnesium oxide) .... Sig 2 tablets by mouth 1-2 times aday as needed for cramps  reduce dosage as cramps go away 5)  Zegerid 40-1100 Mg Caps (Omeprazole-sodium bicarbonate) .Marland Kitchen.. 1 by mouth daily 6)  Ranitidine Hcl  300 Mg Tabs (Ranitidine hcl) .... Sig 1 tablet po once daily and may occasional take a 2nd dose 7)  Avelox 400 Mg Tabs (Moxifloxacin hcl) .Marland Kitchen.. 1 by mouth daily for infection 8)  Prednisone 20 Mg Tabs (Prednisone) .Marland Kitchen.. 1 by mouth two times a day  Other Orders: CT Scan with & without contrast (CT w/& w/o contrast) T-CMP with estimated GFR ([id number]) CBC w/Diff ([id number]) Prescriptions: DUONEB 0.5-2.5 (3) MG/3ML SOLN (IPRATROPIUM-ALBUTEROL) To use in nebulizer two times a day as needed  #2 boxes x 2   Entered and Authorized by:   Tacey Ruiz MD   Signed by:   Tacey Ruiz MD on  05/03/2010   Method used:   Print then Give to Patient   RxID:   [id number] DUONEB 0.5-2.5 (3) MG/3ML SOLN (IPRATROPIUM-ALBUTEROL) To use in nebulizer two times a day as needed  #2 boxes x 3   Entered and Authorized by:   Tacey Ruiz MD   Signed by:   Tacey Ruiz MD on 05/03/2010   Method used:   Electronically to        [facility]  [address] (retail)       [address]       [place], [place]  [address]       Ph: [phone]       Fax: [phone]   RxID:   [id number] PREDNISONE 20 MG TABS (PREDNISONE) 1 by mouth two times a day  #10 x 0   Entered and Authorized by:   Tacey Ruiz MD   Signed by:   Tacey Ruiz MD on 05/03/2010   Method used:   Electronically to        [facility]  [address] (retail)       [address], [address]       [place], [place]  [address]       Ph: [phone]       Fax: [phone]   RxID:   [id number] AVELOX 400 MG TABS (MOXIFLOXACIN HCL) 1 by mouth daily for infection  #10 x 0   Entered and Authorized by:   Tacey Ruiz MD   Signed by:   Tacey Ruiz MD on 05/03/2010   Method used:   Electronically to        [facility]  [address] (retail)       [address], [address]       [place], [place]  [address]       Ph: [phone]       Fax: [phone]   RxID:    [id number]  Physical Exam General appearance: well developed, well nourished, appeared less short of breath Chest/Lungs: improved aeration - right posterior lobe with wheeze/rhonchi Heart: regular rate and  rhythm, no murmur Abdomen: protuberant, nontender Extremities: trace edema MSE: oriented to time, place, and person   The risks, benefits and possible side effects were clearly explained and discussed with the patient.  The patient verbalized clear understanding.  The patient was given instructions to return if symptoms don't improve, worsen or new changes develop.  If it is not during clinic hours and the patient cannot get back to this clinic then the patient was told to seek medical care at an available urgent care or emergency department.  The patient verbalized understanding.    The patient was informed that there is no on-call provider or services available at this clinic during off-hours (when the clinic is closed).  If the patient developed a problem or concern that required immediate attention, the patient was advised to go the the nearest available urgent care or emergency department for medical care.  The patient verbalized understanding.     The patient was strongly advised to obtain a primary care provider.  He was told that this clinic is not equipped to be a primary care clinic.  The patient was told that he would receive better care with the continuity of having a primary care provider.  The patient verbalized clear understanding.  I have reviewed the above medical office visit documention, including diagnoses, history, medications, clinical lists, orders and plan of care.   [name], MD, FAAFP  May 04, 2010

## 2011-01-07 NOTE — Assessment & Plan Note (Signed)
Summary: BREATHING PROBLEMS/EVM   Vital Signs:  Patient profile:   65 year old male Height:      72.5 inches Weight:      268 pounds BMI:     35.98 O2 Sat:      95 % on Room air Temp:     98.6 degrees F oral Pulse rate:   76 / minute Pulse rhythm:   regular Resp:     24 per minute BP sitting:   100 / 64  (left arm) BP standing:   120 / 80  (right arm) Cuff size:   large  Vitals Entered By: Providence Crosby LPN (Apr 21, 2010 12:44 PM)  O2 Flow:  Room air  Visit Type:  complains of breathing problems and multiple problems   History of Present Illness: States its the same old problem trouble breathing they have checked by blood here before so they should know everything.No fever.Complains of pain all over especially at night. severe cramps in legs and in right side now. complains of gerd .and  flatus. Wants a Dot PE done tomorrow(I recommend that that be done at a differrent time)He states he wants antibiotic (penicillin and another rounfd of prwednisone) Initial he dismisses my concern about his smoking and then request Chantix which I discoureged due to the number of medical problems that he is complaining of.   Preventive Screening-Counseling & Management  Alcohol-Tobacco     Smoking Cessation Counseling: yes  Problems Prior to Update: 1)  ? of Uti  (ICD-599.0) 2)  Leukocytosis Unspecified  (ICD-288.60) 3)  ? of Nephrolithiasis  (ICD-592.0) 4)  Cigarette Smoker  (ICD-305.1) 5)  Hematuria, Hx of  (ICD-V13.09) 6)  Gross Hematuria  (ICD-599.71) 7)  Fecal Urgency  (AOZ-308.65) 8)  Urinary Urgency  (ICD-788.63) 9)  Flank Pain, Right  (ICD-789.09) 10)  COPD  (ICD-496)  Medications Prior to Update: 1)  Aspir-Low 81 Mg Tbec (Aspirin) 2)  Nebulizer  Misc (Nebulizers) .... To Use As Directed 3)  Duoneb 0.5-2.5 (3) Mg/9ml Soln (Ipratropium-Albuterol) .... To Use in Nebulizer Two Times A Day As Needed 4)  Cialis 10 Mg Tabs (Tadalafil) .Marland Kitchen.. 1 By Mouth Qd As Needed At Least 1 Hour  Prior To Sexual Activity. Effect May Last Up To 36 Hours.  Current Medications (verified): 1)  Aspir-Low 81 Mg Tbec (Aspirin) 2)  Nebulizer  Misc (Nebulizers) .... To Use As Directed 3)  Duoneb 0.5-2.5 (3) Mg/65ml Soln (Ipratropium-Albuterol) .... To Use in Nebulizer Two Times A Day As Needed 4)  Cialis 10 Mg Tabs (Tadalafil) .Marland Kitchen.. 1 By Mouth Qd As Needed At Least 1 Hour Prior To Sexual Activity. Effect May Last Up To 36 Hours.  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 Father died of Leukemia had heart and lung probs but never smoked or drank sister died of melanoma  2 brothers of bad health  Social History: Reviewed history from 03/29/10 and no changes required. 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   Head:  normocephalic.   Eyes:  No corneal or conjunctival inflammation noted. EOMI. Perrla. Funduscopic exam benign, without hemorrhages, exudates or papilledema. Vision grossly normal. Neck:  supple.   Lungs:  normal respiratory effort, no intercostal retractions, no accessory muscle use, and normal breath sounds.   Heart:  normal rate, regular rhythm, and no murmur.   Abdomen:  liver edge is tenderer. BY AUSCULTATION LIVER IS 3-4 FIGER BRWEATHS BELOW COSTAL MARGIN AND APPEARS TO BE TENDER  TO PALPATION. i HAVE RECOMMEND FUTHER DX TEST WHICH ARE NOT AVAILABLE THRU [place] ON SUNDAY no rebound tenderness and no splenomegaly.   Msk:  cramping Neurologic:  alert & oriented X3, cranial nerves II-XII intact, sensation intact to light touch, and sensation intact to pinprick.      Impression & Recommendations:  Problem # 1:  idiopathic  crampimg  Assessment Unchanged Will try magoxide 2 rtablets onsce or twice a day for cramping   Problem # 2:  COPD (ICD-496) Assessment: Unchanged  His updated medication list for this problem includes:    Duoneb 0.5-2.5 (3) Mg/19ml Soln (Ipratropium-albuterol) .Marland Kitchen... To use in nebulizer two times a day as needed he is complaing of trouble w/ his breathing went thru options of inhalers which he mixes up the type of inhalers that he uses and states which inhaler he can not use  At this time I recommend he tries too see one provider for his lung pronblems  Problem # 3:  reflux  Assessment: Deteriorated Hx of severe reflux  at this time will  recommend he follow up w/ his PCp for that problem until then will place him on zantac 300mg  by mouth q day to twice a day and add zegrid40mg  by mouth q day  Problem # 4:  R side abdominal pain  Assessment: New his liver is enlarged explained several times it is difficult to evaluate since radiographic studies are not available on sunday  Medications Added to Medication List This Visit: 1)  Mag-ox 400 400 Mg Tabs (Magnesium oxide) .... Sig 2 tablets by mouth 1-2 times aday as needed for cramps  reduce dosage as cramps go away 2)  Zegerid 40-1100 Mg Caps (Omeprazole-sodium bicarbonate) .Marland Kitchen.. 1 by mouth daily 3)  Ranitidine Hcl 300 Mg Tabs (Ranitidine hcl) .... Sig 1 tablet po once daily and may occasional take a 2nd dose  Complete Medication List: 1)  Aspir-low 81 Mg Tbec (Aspirin) 2)  Nebulizer Misc (Nebulizers) .... To use as directed 3)  Duoneb 0.5-2.5 (3) Mg/18ml Soln (Ipratropium-albuterol) .... To use in  nebulizer two times a day as needed 4)  Cialis 10 Mg Tabs (Tadalafil) .Marland Kitchen.. 1 by mouth qd as needed at least 1 hour prior to sexual activity. effect may last up to 36 hours. 5)  Mag-ox 400 400 Mg Tabs (Magnesium oxide) .... Sig 2 tablets by mouth 1-2 times aday as needed for cramps  reduce dosage as cramps go away 6)  Zegerid 40-1100 Mg Caps (Omeprazole-sodium bicarbonate) .Marland Kitchen.. 1 by mouth daily 7)  Ranitidine Hcl 300 Mg Tabs (Ranitidine hcl) .... Sig 1 tablet po once daily and may occasional take a 2nd dose  Patient Instructions: 1)  Please schedule a follow-up appointment as needed. 2)  Please schedule an appointment with your primary doctor in : 3)  Tobacco is very bad for your health and your loved ones! You Should stop smoking!. 4)  Stop Smoking Tips: Choose a Quit date. Cut  down before the Quit date. decide what you will do as a substitute when you feel the urge to smoke(gum,toothpick,exercise). 5)  Mag oxide for cramps 2 tablets  once or  twice a day. nce cramping is better he may reduce that dosage.  6)  For reflux that he says is not better we will try: 7)  zantac 300 mg by mouth q day 8)  zegrid 40 mg by mouth q day 9)  Return tomorrow for futher evaluatuion  or CT scan of abdomen to evaluate liver size and concern over hepatomegaly after discussion w/radiology department. Do not eat after midnight so that [place] might be done if it is felt needed. 10)  Need futher evalaution for SOB and COPD 11)  stress the ned to tolerate steroid inhaler and not oral steroids 12)  will leave it up to Dr Severiano Gilbert about Penicillin usage 13)  on leaving request DOT PE feel that currently not realistic for Dr Severiano Gilbert in AM considering other multitude of problems 14)  This patient instruction has been modified from form initialy given to patient as futher reflections on the visit Prescriptions: RANITIDINE HCL 300 MG TABS (RANITIDINE HCL) sig 1 tablet po once daily and may occasional take a 2nd dose  #60 x 1    Entered and Authorized by:   Hassan Rowan MD   Signed by:   Hassan Rowan MD on 04/21/2010   Method used:   Print then Give to Patient   RxID:   [id number] ZEGERID 40-1100 MG CAPS (OMEPRAZOLE-SODIUM BICARBONATE) 1 by mouth daily  #30 x 1   Entered and Authorized by:   Hassan Rowan MD   Signed by:   Hassan Rowan MD on 04/21/2010   Method used:   Print then Give to Patient   RxID:   [id number] MAG-OX 400 400 MG TABS (MAGNESIUM OXIDE) sig 2 tablets by mouth 1-2 times aday as needed for cramps  reduce dosage as cramps go away  #120 x 1   Entered and Authorized by:   Hassan Rowan MD   Signed by:   Hassan Rowan MD on 04/21/2010   Method used:   Print then Give to Patient   RxID:   [id number]
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
## 2011-01-07 NOTE — Assessment & Plan Note (Signed)
Summary: foreign body around nose?/jbb   Vital Signs:  Patient profile:   65 year old male Height:      70.5 inches Weight:      271 pounds BMI:     38.47 O2 Sat:      94 % on Room air Temp:     97.6 degrees F oral Pulse rate:   60 / minute Pulse rhythm:   regular Resp:     20 per minute BP sitting:   131 / 90  Vitals Entered By: Levonne Spiller EMT-P (October 29, 2010 4:13 PM)  O2 Flow:  Room air CC: Possible foreign body in face. Is Patient Diabetic? No Pain Assessment Patient in pain? no       Does patient need assistance? Functional Status Self care Ambulation Normal Comments Pt is a smoker.   Chief Complaint:  Possible foreign body in face.Marland Kitchen  History of Present Illness: Pt wanted to check to see if there was some additional foreign body in the small sore on the face.  Pt says that he hasn't been putting antibiotic ointment on the lesion and it has not healed.  The patient says that he think that there may be a small amount of iron filing in the area.  When he was here several weeks ago he said that he was working in his shop and that there were small iron filings that were in the face.  We removed them and told the patient to care for the wound but the patient hasn't been following instructions.  He says that he is using the diltiazem medication prescribed by the cardiologists.  He says that he needs to wear the monitor 3 more weeks because he lost it while traveling.  Also, he says that his pulse is around 60 and he has been feeling more fatigued.  He did have an episode of AFib with RVR on Nov 15 and that was the time that they started him on the new meds.   Preventive Screening-Counseling & Management  Alcohol-Tobacco     Smoking Cessation Counseling: yes  Allergies (verified): No Known Drug Allergies  Past History:  Past Medical History: Last updated: 09/18/2010 COPD - Emphysema Chronic Active Nicotine Addiction GERD - severe Chronic Muscle Spasms Chronic  Neuralgia Pain in Hands L>R from accident Osteoarthritis  Past Surgical History: Last updated: 04/02/2010 Knee surgery Cholecystectomy Tonsillectomy  Family History: Last updated: 04/02/10 Father died of Leukemia had heart and lung probs but never smoked or drank sister died of melanoma  2 brothers of bad health  Social History: Last updated: 10/17/2010 Married Current Smoker - not willing to quit at this time 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  Risk Factors: Smoking Status: current (10/17/2010) Packs/Day: 1.0 (10/17/2010)  Family History: Reviewed history from April 02, 2010 and no changes required. Father died of Leukemia had heart and lung probs but never smoked or drank sister died of melanoma  2 brothers of bad health  Social History: Reviewed history from 10/17/2010 and no changes required. 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  Review of Systems  The patient denies anorexia, fever, weight loss, weight gain, vision loss, decreased hearing, hoarseness, chest pain, syncope, dyspnea on exertion, peripheral edema, prolonged cough, headaches, hemoptysis, abdominal pain, melena, hematochezia, severe indigestion/heartburn, hematuria, incontinence, genital sores, muscle  weakness, suspicious skin lesions, transient blindness, difficulty walking, depression, unusual weight change, abnormal bleeding, enlarged lymph nodes, angioedema, breast masses, and testicular masses.         He reports of the possible foreign body in the face as above General:  Complains of fatigue; denies chills, fever, loss of appetite, malaise, sleep disorder, sweats, weakness, and weight loss. Eyes:  Denies blurring, discharge, double vision, eye irritation, eye pain, halos, itching, light sensitivity, red eye, vision loss-1 eye, and  vision loss-both eyes. ENT:  Denies decreased hearing, difficulty swallowing, ear discharge, earache, hoarseness, nasal congestion, nosebleeds, postnasal drainage, ringing in ears, sinus pressure, and sore throat. CV:  Complains of fatigue and shortness of breath with exertion; denies bluish discoloration of lips or nails, chest pain or discomfort, difficulty breathing at night, difficulty breathing while lying down, fainting, leg cramps with exertion, lightheadness, near fainting, palpitations, swelling of feet, swelling of hands, and weight gain. Resp:  Complains of cough and shortness of breath; denies chest discomfort, chest pain with inspiration, coughing up blood, excessive snoring, hypersomnolence, morning headaches, pleuritic, sputum productive, and wheezing. GI:  Denies abdominal pain, bloody stools, change in bowel habits, constipation, dark tarry stools, diarrhea, excessive appetite, gas, hemorrhoids, indigestion, loss of appetite, nausea, vomiting, vomiting blood, and yellowish skin color. GU:  Denies decreased libido, discharge, dysuria, erectile dysfunction, genital sores, hematuria, incontinence, nocturia, urinary frequency, and urinary hesitancy. MS:  Denies joint pain, joint redness, joint swelling, loss of strength, low back pain, mid back pain, muscle aches, muscle , cramps, muscle weakness, stiffness, and thoracic pain. Derm:  Complains of lesion(s); denies changes in color of skin, changes in nail beds, dryness, excessive perspiration, flushing, hair loss, insect bite(s), itching, poor wound healing, and rash. Neuro:  Denies brief paralysis, difficulty with concentration, disturbances in coordination, falling down, headaches, inability to speak, memory loss, numbness, poor balance, seizures, sensation of room spinning, tingling, tremors, visual disturbances, and weakness. Psych:  Denies alternate hallucination ( auditory/visual), anxiety, depression, easily angered, easily tearful,  irritability, mental problems, panic attacks, sense of great danger, suicidal thoughts/plans, thoughts of violence, unusual visions or sounds, and thoughts /plans of harming others. Endo:  Denies cold intolerance, excessive hunger, excessive thirst, excessive urination, heat intolerance, polyuria, and weight change. Heme:  Denies abnormal bruising, bleeding, enlarge lymph nodes, fevers, pallor, and skin discoloration. Allergy:  Denies hives or rash, itching eyes, persistent infections, seasonal allergies, and sneezing.  Physical Exam  General:  Well-developed,well-nourished,in no acute distress; alert,appropriate and cooperative throughout examination Head:  Normocephalic and atraumatic without obvious abnormalities. No apparent alopecia or balding. Eyes:  No corneal or conjunctival inflammation noted. EOMI. Perrla. Funduscopic exam benign, without hemorrhages, exudates or papilledema. Vision grossly normal. Ears:  External ear exam shows no significant lesions or deformities.  Otoscopic examination reveals clear canals, tympanic membranes are intact bilaterally without bulging, retraction, inflammation or discharge. Hearing is grossly normal bilaterally. Nose:  small open sore near the right nasolabial fold but no foreign body seen or palpated in the skin and it was washed and cleansed with peroxide 3 times and re-examined and no foreign body was found.  Mouth:  Oral mucosa and oropharynx without lesions or exudates.  Teeth in good repair. Neck:  supple.  full ROM and no masses.   Chest Wall:  no deformities, no tenderness, no masses, and no gynecomastia.   Lungs:  normal respiratory effort, no intercostal retractions, no accessory muscle use, normal breath sounds, no dullness, bilateral posterior wheezing heard Heart:  slow rate  approx 60, no JVD, and regular rhythm today.   Abdomen:  soft, non-tender, normal bowel sounds, no guarding, no rebound tenderness, no abdominal hernia, no hepatomegaly,  and no splenomegaly.   Msk:  no muscle atrophy, joint tenderness, and joint swelling.   Extremities:  No clubbing, cyanosis, edema, or deformity noted with normal full range of motion of all joints.   Neurologic:  No cranial nerve deficits noted. Station and gait are normal. Plantar reflexes are down-going bilaterally. DTRs are symmetrical throughout. Sensory, motor and coordinative functions appear intact. Skin:  please see nose exam otherwise no other lesions seen   Cervical Nodes:  No lymphadenopathy noted Axillary Nodes:  No palpable lymphadenopathy Psych:  Cognition and judgment appear intact. Alert and cooperative with normal attention span and concentration. No apparent delusions, illusions, hallucinations   Impression & Recommendations:  Problem # 1:  ATRIAL FIBRILLATION (ICD-427.31)  His updated medication list for this problem includes:    Aspir-low 81 Mg Tbec (Aspirin) .Marland Kitchen... Take 2  tablet by mouth once a day    Dilt-cd 120 Mg Xr24h-cap (Diltiazem hcl coated beads) ..... One tablet once daily  Continue wearing the monitor and follow up with the cardiologists as recommended.   Problem # 2:  SKIN LESION (ICD-709.9) I recommended that the patient apply local wound care to the wound with topical antibiotic ointment to the wound 2 times per day and avoid touching the wound with his fingers as he has been doing.  Also, I told the patient that I recommended that he see a dermatologist to have the lesion biopsied but the patient declined.  Also, I asked him to please return if the lesion has not completely healed in 1 week.  The patient verbalized clear understanding.    Problem # 3:  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) The patient was counseled and advised to stop using all tobacco products.  Medical assistance was offered and the patient was encouraged to call 1-800-QUIT-NOW to get a smoking cessation coach.    Pt refused flu vaccine.   Problem # 4:  HYPERLIPIDEMIA-MIXED  (ICD-272.4) Assessment: Unchanged  His updated medication list for this problem includes:    Simvastatin 20 Mg Tabs (Simvastatin) .Marland Kitchen... Take one tablet by mouth daily at bedtime  Complete Medication List: 1)  Combivent 18-103 Mcg/act Aero (Ipratropium-albuterol) .... 2 puffs inhaled two times a day 2)  Aspir-low 81 Mg Tbec (Aspirin) .... Take 2  tablet by mouth once a day 3)  Nebulizer Misc (Nebulizers) .... To use as directed 4)  Duoneb 0.5-2.5 (3) Mg/34ml Soln (Ipratropium-albuterol) .... To use in nebulizer two times a day as needed 5)  Mag-ox 400 400 Mg Tabs (Magnesium oxide) .... Sig 2 tablets by mouth 1-2 times aday as needed for cramps  reduce dosage as cramps go away 6)  Zegerid 40-1100 Mg Caps (Omeprazole-sodium bicarbonate) .Marland Kitchen.. 1 by mouth daily 7)  Ranitidine Hcl 300 Mg Tabs (Ranitidine hcl) .... Sig 1 tablet po once daily and may occasional take a 2nd dose 8)  Lortab 10-500 Mg Tabs (Hydrocodone-acetaminophen) .... Sig 1 by mouth q6-8hrs as needed for pain. do not take w/in 6-8hrs of driving may cause sedation. 9)  Silver Sulfadiazine 1 % Crea (Silver sulfadiazine) .... Sig apply 2x a day for burn 10)  Advair Diskus 500-50 Mcg/dose Aepb (Fluticasone-salmeterol) .Marland Kitchen.. 1 puff by mouth two times a day 11)  Nitrolingual 0.4 Mg/spray Soln (Nitroglycerin) .... Use as directed for chest pain 12)  Simvastatin 20 Mg Tabs (Simvastatin) .... Take  one tablet by mouth daily at bedtime 13)  Neurontin 300 Mg Caps (Gabapentin) .... Take 1 by mouth two times a day as needed nerve pain 14)  Dilt-cd 120 Mg Xr24h-cap (Diltiazem hcl coated beads) .... One tablet once daily 15)  Amitriptyline Hcl 25 Mg Tabs (Amitriptyline hcl) .... Take 1 tablet by mouth at hs to help sleep and for chronic pain.  after 2 weeks may increase dose to 2 tabs at hs.  will cause drowsiness  Patient Instructions: 1)  Tobacco is very bad for your health and your loved ones! You Should stop smoking!. 2)  Stop Smoking Tips: Choose  a Quit date. Cut down before the Quit date. decide what you will do as a substitute when you feel the urge to smoke(gum,toothpick,exercise). 3)  Take care for the lesion on your face and avoid touching it with your hands and place the topical antiobiotic ointment on the lesion.  4)  The patient was informed that there is no on-call Emmalyne Giacomo or services available at this clinic during off-hours (when the clinic is closed).  If the patient developed a problem or concern that required immediate attention, the patient was advised to go the the nearest available urgent care or emergency department for medical care.  The patient verbalized understanding.    5)  The risks, benefits and possible side effects were clearly explained and discussed with the patient.  The patient verbalized clear understanding.  The patient was given instructions to return if symptoms don't improve, worsen or new changes develop.  If it is not during clinic hours and the patient cannot get back to this clinic then the patient was told to seek medical care at an available urgent care or emergency department.  The patient verbalized understanding.

## 2011-01-07 NOTE — Letter (Signed)
Summary: REFERRAL  REFERRAL   Imported By: Rosine Beat 09/18/2010 17:00:30  _____________________________________________________________________  External Attachment:    Type:   Image     Comment:   External Document

## 2011-01-07 NOTE — Progress Notes (Signed)
Summary: Heart Monitor  Phone Note Call from Patient Call back at Home Phone [phone]   Caller: Self Call For: [name] Summary of Call: Pt wants to know how much longer he needs to wear the heart monitor-States that the tape is making him sore Initial call taken by: Harlon Flor,  November 06, 2010 9:03 AM  Follow-up for Phone Call        Would continue to wear monitor as this will help [place] monitor rhythm on new medications.  Would start amio 400 mg by mouth two times a day for 5 days, then 200 mg by mouth two times a day, use diltaizem as needed for atrial fib, also start pradaxa BID Follow-up by: Dr Julien Nordmann  Additional Follow-up for Phone Call Additional follow up Details #1::        Called spoke with pt advised of Dr Windell Hummingbird recommendations above.  Pt agrees to cont to wear monitor and start Amiodarone and Pradaxa and is aware to only use Diltiazem as needed for heart racing. Pt also advised to monitor for s+s of bleeding once starts Pradaxa and is aware he will be at an incr risk of bleeding while taking. Rx sent to [facility] [address], pt aware. Additional Follow-up by: Cloyde Reams RN,  November 06, 2010 9:58 AM    New/Updated Medications: DILT-CD 120 MG XR24H-CAP (DILTIAZEM HCL COATED BEADS) one tablet once daily as needed for rapid heartrate AMIODARONE HCL 200 MG TABS (AMIODARONE HCL) Take 2 tablets two times a day x 5 days, then decrease dosage to 1 tablet two times a day PRADAXA 150 MG CAPS (DABIGATRAN ETEXILATE MESYLATE) Take 1 tablet by mouth two times a day Prescriptions: PRADAXA 150 MG CAPS (DABIGATRAN ETEXILATE MESYLATE) Take 1 tablet by mouth two times a day  #60 x 3   Entered by:   Cloyde Reams RN   Authorized by:   Dossie Arbour MD   Signed by:   Cloyde Reams RN on 11/06/2010   Method used:   Electronically to        [facility] [address] (retail)       [address], [address]       [place], [place]  [address]       Ph:  [phone]       Fax: [phone]   RxID:   [id number] AMIODARONE HCL 200 MG TABS (AMIODARONE HCL) Take 2 tablets two times a day x 5 days, then decrease dosage to 1 tablet two times a day  #80 x 3   Entered by:   Cloyde Reams RN   Authorized by:   Dossie Arbour MD   Signed by:   Cloyde Reams RN on 11/06/2010   Method used:   Electronically to        [facility] [address] (retail)       [address], [address]       [place], [place]  [address]       Ph: [phone]       Fax: [phone]   RxID:   [id number]

## 2011-01-07 NOTE — Medication Information (Signed)
Summary: Tax adviser   Imported By: Harlon Flor 11/07/2010 11:09:01  _____________________________________________________________________  External Attachment:    Type:   Image     Comment:   External Document

## 2011-01-07 NOTE — Assessment & Plan Note (Signed)
Summary: talk to dr Laural Benes about his medications/jbb   History of Present Illness: The patient came in today to speak with the physician about some of the medication changes that the cardiologists have made with his medications.  He said that he had a couple of episodes where his heart rate went up into the 130s.  He said that he was called by the cardiology team because he is currently wearing a cardiac monitor which he should be wearing for at least 30 days.  He says that he was also started on a blood thinner medication.  He says that he is currently feeling fine but he says that when he exerts himself physically is the time when he has an episode of the elevated  heart rate.  He says that the cardiologists are fully aware of the situation and they are monitoring him closely and he is scheduled to see them again in the next few days.  He brought me his 2 new prescriptions and I opened the bags to learn that he was started on pacerone and the other prescription in the bag was pantoprazole.  I told the patient that there was no blood thinner in the bag and that he needed to go back to the pharmacy to check with them for the additional prescription.  I told the patient to check back with the cardiologists if there was no blood thinner medication called in.  I reviewed the records and it looks like Pradaxa was the medication that they called in for the patient.  The patient verbalized clear understanding.   I evaluated the patient and he was stable and in no distress.  His pulse was 72 and he was having no SOB or CP.  No palpatations noted by the patient.   He is scheduled to follow up here again in a couple of weeks for recheck or sooner if needed.  [name], M.D., F.A.A.F.P.  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  Risk Factors: Smoking Status: current (10/17/2010) Packs/Day: 1.0 (10/17/2010)  Past Medical History: Reviewed history from 09/18/2010 and no changes required. COPD - Emphysema Chronic Active Nicotine Addiction GERD - severe Chronic Muscle Spasms Chronic Neuralgia Pain in Hands L>R from accident Osteoarthritis  Past Surgical History: Reviewed history from 03-25-10 and no changes required. Knee surgery Cholecystectomy Tonsillectomy  Family History: Reviewed history from Mar 25, 2010 and no changes required. Father died of Leukemia had heart and lung probs but never smoked or drank sister died of melanoma  2 brothers of bad health  Social History: Reviewed history from 10/17/2010 and no changes required. 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  Review of Systems General:  Denies chills, fatigue, fever, loss of appetite, malaise, sleep disorder, sweats, weakness, and weight loss. Eyes:  Denies blurring, discharge, double vision, eye irritation, eye pain, halos, itching, light sensitivity, red eye, vision loss-1 eye, and vision loss-both eyes. ENT:  Denies decreased hearing, difficulty swallowing, ear  discharge, earache, hoarseness, nasal congestion, nosebleeds, postnasal drainage, ringing in ears, sinus pressure, and sore throat. CV:  Complains of palpitations; palpatations occur with the jumps in heart rate to 130s. Resp:  Denies chest discomfort, chest pain with inspiration, cough, coughing up blood, excessive snoring, hypersomnolence, morning headaches, pleuritic, shortness of breath, sputum productive, and wheezing. GI:  Denies abdominal pain,  bloody stools, change in bowel habits, constipation, dark tarry stools, diarrhea, excessive appetite, gas, hemorrhoids, indigestion, loss of appetite, nausea, vomiting, vomiting blood, and yellowish skin color. MS:  Denies joint pain, joint redness, joint swelling, loss of strength, low back pain, mid back pain, muscle aches, muscle , cramps, muscle weakness, stiffness, and thoracic pain.  Physical Exam  General:  Well-developed,well-nourished,in no acute distress; alert,appropriate and cooperative throughout examination Head:  Normocephalic and atraumatic without obvious abnormalities. No apparent alopecia or balding. Eyes:  No corneal or conjunctival inflammation noted. EOMI. Perrla. Funduscopic exam benign, without hemorrhages, exudates or papilledema. Vision grossly normal. Extremities:  No clubbing, cyanosis, edema, or deformity noted with normal full range of motion of all joints.   Psych:  Cognition and judgment appear intact. Alert and cooperative with normal attention span and concentration. No apparent delusions, illusions, hallucinations    Complete Medication List: 1)  Combivent 18-103 Mcg/act Aero (Ipratropium-albuterol) .... 2 puffs inhaled two times a day 2)  Aspir-low 81 Mg Tbec (Aspirin) .... Take 2  tablet by mouth once a day 3)  Nebulizer Misc (Nebulizers) .... To use as directed 4)  Duoneb 0.5-2.5 (3) Mg/27ml Soln (Ipratropium-albuterol) .... To use in nebulizer two times a day as needed 5)  Mag-ox 400 400 Mg Tabs (Magnesium oxide) .... Sig 2 tablets by mouth 1-2 times aday as needed for cramps  reduce dosage as cramps go away 6)  Zegerid 40-1100 Mg Caps (Omeprazole-sodium bicarbonate) .Marland Kitchen.. 1 by mouth daily 7)  Ranitidine Hcl 300 Mg Tabs (Ranitidine hcl) .... Sig 1 tablet po once daily and may occasional take a 2nd dose 8)  Lortab 10-500 Mg Tabs (Hydrocodone-acetaminophen) .... Sig 1 by mouth q6-8hrs as needed for pain. do not take w/in 6-8hrs of driving may cause  sedation. 9)  Silver Sulfadiazine 1 % Crea (Silver sulfadiazine) .... Sig apply 2x a day for burn 10)  Advair Diskus 500-50 Mcg/dose Aepb (Fluticasone-salmeterol) .Marland Kitchen.. 1 puff by mouth two times a day 11)  Nitrolingual 0.4 Mg/spray Soln (Nitroglycerin) .... Use as directed for chest pain 12)  Simvastatin 20 Mg Tabs (Simvastatin) .... Take one tablet by mouth daily at bedtime 13)  Neurontin 300 Mg Caps (Gabapentin) .... Take 1 by mouth two times a day as needed nerve pain 14)  Dilt-cd 120 Mg Xr24h-cap (Diltiazem hcl coated beads) .... One tablet once daily as needed for rapid heartrate 15)  Amitriptyline Hcl 25 Mg Tabs (Amitriptyline hcl) .... Take 1 tablet by mouth at hs to help sleep and for chronic pain.  after 2 weeks may increase dose to 2 tabs at hs.  will cause drowsiness 16)  Amiodarone Hcl 200 Mg Tabs (Amiodarone hcl) .... Take 2 tablets two times a day x 5 days, then decrease dosage to 1 tablet two times a day 17)  Pradaxa 150 Mg Caps (Dabigatran etexilate mesylate) .... Take 1 tablet by mouth two times a day

## 2011-01-07 NOTE — Assessment & Plan Note (Signed)
Vital Signs:  Patient Profile:   65 Years Old Male CC:      blood in urine/RWT Height:     72.5 inches Weight:      267 pounds BMI:     35.84 O2 Sat:      96 % Temp:     97.6 degrees F oral Pulse rate:   82 / minute Pulse rhythm:   regular Resp:     22 per minute BP sitting:   145 / 94  (left arm)  Vitals Entered By: Levonne Spiller EMT-P (March 28, 2010 1:56 PM)             Is Patient Diabetic? No Comments pt. is a smoker. 1-1/2 pks per day.      Current Allergies: No known allergies History of Present Illness History from: patient Reason for visit: follow up of multiple problems, see chief complaint Chief Complaint: blood in urine/RWT History of Present Illness: Pt started with bright red blood in urine started yesterday and then 1 hr later he urinated and it was clear.  He said that this morning he had more blood in urine but it cleared up 1 hr later. Pt says that he has no sxs of burning or dysuria or pain.  Pt denies flank pain and abdominal pain.  The patient believes that he may have passed a kidney stone but he is unsure.   Pt says that he feels much better using the nebulizers for the COPD.  He says that he felt shaky with using the Advair.  He said that the Advair caused palpatations.  He says that he stopped using all the inhalers and only uses the nebs now.  He feels that he is much better using the nebs only.  Pt denies fever and chills.  Pt denies weight loss, headache and blurry vision.   Current Meds ASPIR-LOW 81 MG TBEC (ASPIRIN)  COMBIVENT 18-103 MCG/ACT AERO (IPRATROPIUM-ALBUTEROL) 2 puffs by mouth three times a day as needed shortness of breath/wheezing PREDNISONE (PAK) 10 MG TABS (PREDNISONE) as directed NEBULIZER  MISC (NEBULIZERS) to use as directed DUONEB 0.5-2.5 (3) MG/3ML SOLN (IPRATROPIUM-ALBUTEROL) To use in nebulizer two times a day as needed CIALIS 10 MG TABS (TADALAFIL) 1 by mouth qd as needed at least 1 hour prior to sexual activity. Effect may  last up to 36 hours. BACTRIM DS 800-160 MG TABS (SULFAMETHOXAZOLE-TRIMETHOPRIM) take 1 by mouth two times a day  REVIEW OF SYSTEMS Constitutional Symptoms      Denies fever, chills, night sweats, weight loss, weight gain, and fatigue.      Comments: blood in urine? Eyes       Denies change in vision, eye pain, eye discharge, glasses, contact lenses, and eye surgery. Ear/Nose/Throat/Mouth       Denies hearing loss/aids, change in hearing, ear pain, ear discharge, dizziness, frequent runny nose, frequent nose bleeds, sinus problems, sore throat, hoarseness, and tooth pain or bleeding.  Respiratory       Complains of wheezing and shortness of breath.      Denies dry cough, productive cough, asthma, bronchitis, and emphysema/COPD.  Cardiovascular       Denies murmurs, chest pain, and tires easily with exhertion.    Gastrointestinal       Denies stomach pain, nausea/vomiting, diarrhea, constipation, blood in bowel movements, and indigestion. Genitourniary       Complains of blood or discharge from penis and kidney stones.      Denies painful urination and loss  of urinary control. Neurological       Denies paralysis, seizures, and fainting/blackouts. Musculoskeletal       Denies muscle pain, joint pain, joint stiffness, decreased range of motion, redness, swelling, muscle weakness, and gout.  Skin       Denies bruising, unusual mles/lumps or sores, and hair/skin or nail changes.  Psych       Denies mood changes, temper/anger issues, anxiety/stress, speech problems, depression, and sleep problems. Other Comments: SOB much improved since using the nebs   Past History:  Past Medical History: COPD - Emphysema Chronic Active Nicotine Addiction  Family History: Reviewed history from 03/21/2010 and no changes required. Father died of Leukemia had heart and lung probs but never smoked or drank sister died of melanoma  2 brothers of bad health  Social History: Reviewed history from  03/21/2010 and no changes required. Married Current Smoker Occupation: Long distance truck Engineer, production Exam General appearance: well developed, well nourished, no acute distress, stong smell of tobacco on clothing Head: normocephalic, atraumatic Eyes: conjunctivae and lids normal Pupils: equal, round, reactive to light Ears: normal, no lesions or deformities Nasal: mucosa pink, nonedematous, no septal deviation, turbinates normal Oral/Pharynx: tongue normal, posterior pharynx without erythema or exudate Neck: neck supple,  trachea midline, no masses Thyroid: no nodules, masses, tenderness, or enlargement Chest/Lungs: no rales, rare expiratory wheezes, or rhonchi bilateral, breath sounds equal without effort Heart: regular rate and  rhythm, no murmur Abdomen: soft, non-tender without obvious organomegaly, no flank pain GU: deferred Extremities: normal extremities Neurological: grossly intact and non-focal Back: no tenderness over musculature, straight leg raises negative bilaterally, deep tendon reflexes 2+ at achilles and patella Skin: no obvious rashes or lesions MSE: oriented to time, place, and person Assessment  Assessed COPD as improved - Clanford Johnson MD Assessed FLANK PAIN, RIGHT as improved - Clanford Johnson MD Assessed URINARY URGENCY as improved - Clanford Johnson MD Assessed GROSS HEMATURIA as improved - Clanford Johnson MD Assessed CIGARETTE SMOKER as unchanged - Clanford Johnson MD New Problems: ? of UTI (ICD-599.0) LEUKOCYTOSIS UNSPECIFIED (ICD-288.60) ? of NEPHROLITHIASIS (ICD-592.0) CIGARETTE SMOKER (ICD-305.1) HEMATURIA, HX OF (ICD-V13.09) GROSS HEMATURIA (ICD-599.71)   Patient Education: Patient and/or caregiver instructed in the following: fluids, quit smoking.  Plan New Medications/Changes: BACTRIM DS 800-160 MG TABS (SULFAMETHOXAZOLE-TRIMETHOPRIM) take 1 by mouth two times a day  #14 x 0, 03/28/2010, Clanford Johnson MD  New  Orders: Urinalysis-dipstick only (Medicare patient) [81003QW] T-Culture, Urine [04540-98119] T-Urinalysis [81003-65000] Tobacco use cessation intermediate 3-10 minutes [99406] Prescription Created Electronically [phone] Planning Comments:   1.  Stop using the Advair 2.  Continue using the Combivent and Nebulizer Treatments 3.  Return if any further blood is seen  4.  Get your own primary care physician and establish as soon as possible.  5.  I recommend you get your CBC blood count rechecked and urine rechecked in 1 month  Follow Up: Establish and see a primary care physician in 1-2 weeks  The patient and/or caregiver has been counseled thoroughly with regard to medications prescribed including dosage, schedule, interactions, rationale for use, and possible side effects and they verbalize understanding.  Diagnoses and expected course of recovery discussed and will return if not improved as expected or if the condition worsens. Patient and/or caregiver verbalized understanding.  Prescriptions: BACTRIM DS 800-160 MG TABS (SULFAMETHOXAZOLE-TRIMETHOPRIM) take 1 by mouth two times a day  #14 x 0   Entered and Authorized by:   Standley Dakins MD   Signed by:  Standley Dakins MD on 03/28/2010   Method used:   Electronically to        [facility] [address] (retail)       [address], [address]       [place], [place]  [address]       Ph: [phone]       Fax: [phone]   RxID:   [id number]   Patient Instructions: 1)  Please schedule an appointment with your primary doctor in : 2)  Tobacco is very bad for your health and your loved ones! You Should stop smoking!. 3)  Stop Smoking Tips: Choose a Quit date. Cut down before the Quit date. decide what you will do as a substitute when you feel the urge to smoke(gum,toothpick,exercise).   _______________________________________________________________________  I have reviewed the above medical office  visit documention, including diagnoses, history, medications, clinical lists, orders and plan of care.   [name], MD, FAAFP  March 28, 2010      [name], M.D., F.A.A.F.P.  March 28, 2010

## 2011-01-07 NOTE — Assessment & Plan Note (Signed)
Summary: REFILL ON MEDICATION/EVM   Vital Signs:  Patient profile:   65 year old male Height:      71 inches Weight:      267 pounds BMI:     37.37 O2 Sat:      96 % on Room air Temp:     97.1 degrees F oral Pulse rate:   61 / minute Pulse rhythm:   regular Resp:     20 per minute BP sitting:   137 / 86  (right arm)  Vitals Entered By: Levonne Spiller EMT-P (October 17, 2010 11:29 AM)  O2 Flow:  Room air CC: Follow-Up Is Patient Diabetic? No Pain Assessment Patient in pain? no        Visit Type:  Return Primary Care Provider:  NBA  Chief Complaint:  Follow-Up.  History of Present Illness: The pt is presenting for follow up appt today.  The patient reports that he was started on a 30 day loop recorder by the cardiologist and has been wearing it for the past 3 days.  He reports that he was told to start taking diltiazem CD 120 mg  -  1 tablet daily.  He reports that he is doing well.  He says  that he needs Soma medication refill and also reports that he needs antibiotics for coughing.  He says that he has been able to cut down on his cigarette smoking to 1 to 1.5 packs per day down from 3 ppd.  The patient reports that he occasionally does have chest pains, sob and wheezing.  The patient is also reporting that he is taking soma so that he can sleep.    He reports that he is coughing up mucus.  He also reports that he is having a small lesion on his face with a foreign body from a brush bristle that he wanted to get evaluated.  The patient has been a very poor historian.  He has been talking constantly and not staying on subject and very stubborn at times in terms of certain health recommendations.  He absolutely refuses pneumovax and flu vaccines.  He said that he would not ever consider taking one of the vaccines.   Preventive Screening-Counseling & Management  Alcohol-Tobacco     Smoking Status: current     Smoking Cessation Counseling: yes     Packs/Day: 1.0     Tobacco  Counseling: to quit use of tobacco products  Current Medications (verified): 1)  Combivent 18-103 Mcg/act Aero (Ipratropium-Albuterol) .... 2 Puffs Inhaled Two Times A Day 2)  Aspir-Low 81 Mg Tbec (Aspirin) .... Take 2  Tablet By Mouth Once A Day 3)  Nebulizer  Misc (Nebulizers) .... To Use As Directed 4)  Duoneb 0.5-2.5 (3) Mg/60ml Soln (Ipratropium-Albuterol) .... To Use in Nebulizer Two Times A Day As Needed 5)  Mag-Ox 400 400 Mg Tabs (Magnesium Oxide) .... Sig 2 Tablets By Mouth 1-2 Times Aday As Needed For Cramps  Reduce Dosage As Cramps Go Away 6)  Zegerid 40-1100 Mg Caps (Omeprazole-Sodium Bicarbonate) .Marland Kitchen.. 1 By Mouth Daily 7)  Ranitidine Hcl 300 Mg Tabs (Ranitidine Hcl) .... Sig 1 Tablet Po Once Daily and May Occasional Take A 2nd Dose 8)  Soma 350 Mg Tabs (Carisoprodol) .... Sig 1 -2 By Mouth Q 8hrs As Needed For Pain Careful May Cause Sedation Do Not Take 6-8hrs Before Driving 9)  Lortab 16-109 Mg Tabs (Hydrocodone-Acetaminophen) .... Sig 1 By Mouth Q6-8hrs As Needed For Pain. Do Not Take W/in  6-8hrs of Driving May Cause Sedation. 10)  Silver Sulfadiazine 1 % Crea (Silver Sulfadiazine) .... Sig Apply 2x A Day For Burn 11)  Advair Diskus 500-50 Mcg/dose Aepb (Fluticasone-Salmeterol) .Marland Kitchen.. 1 Puff By Mouth Two Times A Day 12)  Nitrolingual 0.4 Mg/spray Soln (Nitroglycerin) .... Use As Directed For Chest Pain 13)  Simvastatin 20 Mg Tabs (Simvastatin) .... Take One Tablet By Mouth Daily At Bedtime 14)  Neurontin 300 Mg Caps (Gabapentin) .... 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  Risk Factors: Smoking Status: current (10/17/2010) Packs/Day: 1.0 (10/17/2010)  Past Medical History: Reviewed history from 09/18/2010 and no changes required. COPD - Emphysema Chronic Active Nicotine Addiction GERD - severe Chronic Muscle Spasms Chronic Neuralgia Pain in Hands L>R from accident Osteoarthritis  Past Surgical History: Reviewed history from 04/02/10 and no changes required. Knee surgery Cholecystectomy Tonsillectomy    Family History: Reviewed history from 04/02/10 and no changes required. 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  Review of Systems       The patient complains of chest pain, syncope, dyspnea on exertion, prolonged cough, and muscle weakness.  The patient denies anorexia, fever, weight loss, weight gain, vision loss, decreased hearing, hoarseness, peripheral edema, headaches, abdominal pain, melena, hematochezia, severe indigestion/heartburn, hematuria, incontinence, genital sores, transient blindness, difficulty walking, depression, unusual weight change, abnormal bleeding, enlarged lymph nodes, angioedema, and testicular masses.         all other systems reviewed and reported as negative.   Physical Exam  General:  well developed, well nourished, appeared less short of breath Head:  normocephalic.  atraumatic and no abnormalities observed.  small foreign object in face noted. small plastic black bristle piece which was subsequently removed after cleansing the area with peroxide and water and betadine.  Eyes:  No corneal or conjunctival inflammation noted. EOMI. Perrla. Funduscopic exam benign, without hemorrhages, exudates or papilledema. Vision grossly normal. Ears:   External ear exam shows no significant lesions or deformities.  Otoscopic examination reveals clear canals, tympanic membranes are intact bilaterally without bulging, retraction, inflammation or discharge. Hearing is grossly normal bilaterally. Nose:  External nasal examination shows no deformity or inflammation. mucosal erythema.   Mouth:  pharyngeal erythema, poor dentition, and teeth missing.   Neck:  supple.  full ROM and no masses.   Chest Wall:  no deformities, no tenderness, no masses, and no gynecomastia.   Lungs:  normal respiratory effort, no intercostal retractions, no accessory muscle use, normal breath sounds, no dullness, bilateral posterior wheezing heard Heart:  normal rate, no JVD, and regular rhythm today.   Abdomen:  soft, non-tender, normal bowel sounds, no guarding, no rebound tenderness, no abdominal hernia, no hepatomegaly, and no splenomegaly.   Msk:  no muscle atrophy, joint tenderness, and  joint swelling.   Pulses:  R and L carotid,radial,femoral,dorsalis pedis and posterior tibial pulses are full and equal bilaterally Extremities:  No clubbing, cyanosis, edema, or deformity noted with normal full range of motion of all joints.   Neurologic:  No cranial nerve deficits noted. Station and gait are normal. Plantar reflexes are down-going bilaterally. DTRs are symmetrical throughout. Sensory, motor and coordinative functions appear intact. Skin:  turgor normal and color normal.   Cervical Nodes:  No lymphadenopathy noted Axillary Nodes:  No palpable lymphadenopathy Psych:  Cognition and judgment appear intact. Alert and cooperative with normal attention span and concentration. No apparent delusions, illusions, hallucinations   Impression & Recommendations:  Problem # 1:  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)  Orders: Spirometry w/Graph (30865) Prescription Created Electronically [phone]) Pt had 3 attempts with the spirometry device and results consistent with mild to  moderate restrictive lung disease.  Smoking cessation counseling >10 minutes performed today with patient.  The patient was counseled and advised to stop using all tobacco products.  Medical assistance was offered and the patient was encouraged to call 1-800-QUIT-NOW to get a smoking cessation coach.    Doxycycline 100 mg by mouth two times a day prescribed   Problem # 2:  CHEST PAIN-UNSPECIFIED (ICD-786.50)  Pt is wearing a 30 day cardiac rhythm event recorder and following with cardiology.  He reports that he will have a stress test after he has completed the 30 day monitoring.  Also, Pt was started on a new medication : Diltiazem CD 120 and given instructions by his cardiologist to take 1 tablet daily and to discontinue if he develops dizziness.  The pt said that he wasn't going to start the medication.  I asked him to follow the direction of his heart doctors and to report to them if he is having any problems.  The patient verbalized clear understanding.  I told him to follow up and or return if he is having dizziness or worsening SOB or other symptoms.  Encouraged pt to stop smoking.  He was started on simvastatin by his cardiologist and seems to be tolerating it well.   Orders: Prescription Created Electronically [phone])  Problem # 3:  ATRIAL FIBRILLATION WITH SLOW VENTRICULAR RESPONSE (ICD-427.31)  His updated medication list for this problem includes:    Aspir-low 81 Mg Tbec (Aspirin) .Marland Kitchen... Take 2  tablet by mouth once a day Started on diltiazem CD 120 as mentioned above  Orders: Prescription Created Electronically [phone])  Problem # 4:  Hypertension  Blood pressure is improved today, Pt asked to continue medications and close monitoring.   Problem # 5:  HYPERLIPIDEMIA-MIXED (ICD-272.4)  His updated medication list for this problem includes:    Simvastatin 20 Mg Tabs (Simvastatin) .Marland Kitchen... Take one tablet by mouth daily at bedtime  Orders: Prescription Created Electronically  [phone])  Problem # 6:  PARESTHESIA, HANDS (ICD-782.0) The patient reported that the gabapentin is helping to improve is symptoms of paresthesias in the arms and hands.  He is requesting a refill of the medication so that he can continue taking it.   Problem # 7:  MUSCLE SPASM (ICD-728.85) Improved; Pt says that he wants more SOMA because he has been using it for sleep and for body aches and pains.  I told him that I was uncomfortable with him using it for this and that I wanted him to try something else for his insomnia and chronic body aches and pains.  I have recommended that he try using amytriptyline  25 mg 1 tablet before bed and with instructions to slowly titrate up to 50 mg after several weeks if needed. This can help him sleep and help with his chronic pains.    Complete Medication List: 1)  Combivent 18-103 Mcg/act Aero (Ipratropium-albuterol) .... 2 puffs inhaled two times a day 2)  Aspir-low 81 Mg Tbec (Aspirin) .... Take 2  tablet by mouth once a day 3)  Nebulizer Misc (Nebulizers) .... To use as directed 4)  Duoneb 0.5-2.5 (3) Mg/79ml Soln (Ipratropium-albuterol) .... To use in nebulizer two times a day as needed 5)  Mag-ox 400 400 Mg Tabs (Magnesium oxide) .... Sig 2 tablets by mouth 1-2 times aday as needed for cramps  reduce dosage as cramps go away 6)  Zegerid 40-1100 Mg Caps (Omeprazole-sodium bicarbonate) .Marland Kitchen.. 1 by mouth daily 7)  Ranitidine Hcl 300 Mg Tabs (Ranitidine hcl) .... Sig 1 tablet po once daily and may occasional take a 2nd dose 8)  Lortab 10-500 Mg Tabs (Hydrocodone-acetaminophen) .... Sig 1 by mouth q6-8hrs as needed for pain. do not take w/in 6-8hrs of driving may cause sedation. 9)  Silver Sulfadiazine 1 % Crea (Silver sulfadiazine) .... Sig apply 2x a day for burn 10)  Advair Diskus 500-50 Mcg/dose Aepb (Fluticasone-salmeterol) .Marland Kitchen.. 1 puff by mouth two times a day 11)  Nitrolingual 0.4 Mg/spray Soln (Nitroglycerin) .... Use as directed for chest pain 12)   Simvastatin 20 Mg Tabs (Simvastatin) .... Take one tablet by mouth daily at bedtime 13)  Neurontin 300 Mg Caps (Gabapentin) .... Take 1 by mouth two times a day as needed nerve pain 14)  Doxycycline Hyclate 100 Mg Tabs (Doxycycline hyclate) .... Take  by mouth two times a day with food until gone 15)  Amitriptyline Hcl 25 Mg Tabs (Amitriptyline hcl) .... Take 1 tablet by mouth at hs to help sleep and for chronic pain.  after 2 weeks may increase dose to 2 tabs at hs.  will cause drowsiness  Patient Instructions: 1)  Tobacco is very bad for your health and your loved ones! You Should stop smoking!. 2)  Stop Smoking Tips: Choose a Quit date. Cut down before the Quit date. decide what you will do as a substitute when you feel the urge to smoke(gum,toothpick,exercise). 3)  It is important that you exercise regularly at least 20 minutes 5 times a week. If you develop chest pain, have severe difficulty breathing, or feel very tired , stop exercising immediately and seek medical attention. 4)  You need to lose weight. Consider a lower calorie diet and regular exercise.  5)  Please reconsider a flu vaccine and pneumonia vaccine.  Please come in for a complete physical in a couple of months.   6)  The patient was informed that there is no on-call provider or services available at this clinic during off-hours (when the clinic is closed).  If the patient developed a problem or concern that required immediate attention, the patient was advised to go the the nearest available urgent care or emergency department for medical care.  The patient verbalized understanding.    Prescriptions: AMITRIPTYLINE HCL 25 MG TABS (AMITRIPTYLINE HCL) take 1 tablet by mouth at HS to help sleep and for chronic pain.  After 2 weeks may increase dose to 2 tabs at HS.  Will Cause Drowsiness  #60 x 1   Entered and Authorized by:   Standley Dakins MD   Signed by:   Standley Dakins MD on 10/17/2010   Method used:  Electronically to         [facility] (retail)       [address]       [place], [place]  [address]       Ph: [phone]       Fax: [phone]   RxID:   [id number] DOXYCYCLINE HYCLATE 100 MG TABS (DOXYCYCLINE HYCLATE) take  by mouth two times a day with food until gone  #14 x 0   Entered and Authorized by:   Standley Dakins MD   Signed by:   Standley Dakins MD on 10/17/2010   Method used:   Electronically to        [facility] [address] (retail)       [address], [address]       [place], [place]  [address]       Ph: [phone]       Fax: [phone]   RxID:   [id number]    Orders Added: 1)  Spirometry w/Graph [[id number]] 2)  Prescription Created Electronically [phone]   Pt absolutely refused the flu vaccine and pneumonia vaccine after asking numerous times and explaining the health benefits.  I spent more than 70 mins with the patient today because he is a very difficult historian and has been really stubborn about complying with our recommedations and I spent a lot of time with him reviewing the cardiology records and explaining to the patient the importance of following through with the cardiologist's recommendations and therapies, etc.

## 2011-01-07 NOTE — Procedures (Signed)
Summary: [facility] Order  [facility] Order   Imported By: Harlon Flor 10/23/2010 12:05:03  _____________________________________________________________________  External Attachment:    Type:   Image     Comment:   External Document

## 2011-01-07 NOTE — Letter (Signed)
Summary: EKG  EKG   Imported By: Rosine Beat 09/18/2010 16:59:32  _____________________________________________________________________  External Attachment:    Type:   Image     Comment:   External Document

## 2011-01-07 NOTE — Letter (Signed)
Summary: tobacco quit plan  tobacco quit plan   Imported By: Chelsea Aus 03/28/2010 15:54:45  _____________________________________________________________________  External Attachment:    Type:   Image     Comment:   External Document

## 2011-01-07 NOTE — Miscellaneous (Signed)
  Clinical Lists Changes  Orders: Added new Service order of Prescription Created Electronically ([id number]) - Signed 
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
## 2011-01-07 NOTE — Progress Notes (Signed)
  Phone Note Call from Patient   Caller: [name] Call For: Dr. Maryln Manuel Reason for Call: Refill Medication Summary of Call: Patient called for refill on antibiotics because he is not feeling well.  Pt told to check with the pharmacy later today.   Initial call taken by: Dorna Leitz,  September 25, 2010 1:42 PM  Follow-up for Phone Call        Let the patient know that his URI is most likely viral and may not have responded to antibiotics. Will call in one more RX for the doxycycline but if no improvement, will need for patient to come in for an OV to be re-assessed.  [name], M.D., F.A.A.F.P.  September 25, 2010    Follow-up by: Standley Dakins MD,  September 25, 2010 2:28 PM    Prescriptions: DOXYCYCLINE HYCLATE 100 MG CAPS (DOXYCYCLINE HYCLATE) take 1 by mouth two times a day with food  #20 x 0   Entered and Authorized by:   Standley Dakins MD   Signed by:   Standley Dakins MD on 09/25/2010   Method used:   Electronically to        [facility] [address] (retail)       [address], [address]       [place], [place]  [address]       Ph: [phone]       Fax: [phone]   RxID:   [id number]  The risks, benefits and possible side effects were clearly explained and discussed with the patient.  The patient verbalized clear understanding.  The patient was given instructions to return if symptoms don't improve, worsen or new changes develop.  If it is not during clinic hours and the patient cannot get back to this clinic then the patient was told to seek medical care at an available urgent care or emergency department.  The patient verbalized understanding.    The patient was informed that there is no on-call Tegan Burnside or services available at this clinic during off-hours (when the clinic is closed).  If the patient developed a problem or concern that required immediate attention, the patient was advised to go the the nearest available urgent  care or emergency department for medical care.  The patient verbalized understanding.     It was clearly explained to the patient that this [facility] is not intended to be a primary care clinic.  The patient is always better served by the continuity of care and the Theona Muhs/patient relationships developed with their dedicated primary care Shoichi Mielke.  The patient was told to be sure to follow up as soon as possible with their primary care Lazaria Schaben to discuss treatments received and to receive further examination and testing.  The patient verbalized understanding. The will f/u with PCP ASAP.

## 2011-01-09 NOTE — Assessment & Plan Note (Signed)
Summary: congestion, possible bloodwork/jbb   Vital Signs:  Patient profile:   65 year old male Height:      71.5 inches Weight:      280 pounds BMI:     38.65 O2 Sat:      94 % on Room air Temp:     97.5 degrees F oral Pulse rate:   58 / minute Pulse rhythm:   regular Resp:     22 per minute BP sitting:   128 / 85  (right arm)  Vitals Entered By: Levonne Spiller EMT-P (12-21-2010 3:34 PM)  O2 Flow:  Room air CC: S.O.B. Is Patient Diabetic? No Pain Assessment Patient in pain? no       Does patient need assistance? Functional Status Self care Ambulation Normal   Chief Complaint:  S.O.B..  History of Present Illness: The patient is presenting today complaining of worsening symptoms of chest congestion and cough.  He continues to smoke cigarettes.  He says that the albuterol inhaler is not strong enough.  He says that he used to have a stronger inhaler and upon review of his medical record it was combivent.  I explained to him that albuterol is a rescue inhaler.  The patient is requesting copies of his medical records to send to the [facility].  He is requesting a note stating that he cannot work because of COPD.  He says that he was told to get some blood work done by his cardiologist including a lipid panel.  He says that he is having no current CP.  He says that he takes the antibiotics and 1 week later his symptoms of "chest congestion" return.  He says that he is constantly sweating and feeling feverish but has no temperature when it is tested.  The patient is saying that he needs refills on his medications.   I explained to him that I did not feel comfortable having him on longterm narcotic pain medications.  I have worked hard to get him off of the SOMA and sleeping pills that he had been on.   I'm also going to wean down these pain medications and try to get him off of them.  If he was not in agreement with that he may need to find another medical provider.  Also,  because he is coming to the clinic every other week asking for antibiotic prescriptions I am going to refer him to a qualified pulmonologist for an evaluation of his COPD.   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 Long history of medical self-neglect and then sought medical treatment when obtained Medicare insurance  Risk Factors: Smoking Status: never (12/21/10) Packs/Day: 1.0 (21-Dec-2010)  Past Medical History: COPD - Emphysema Chronic Active Nicotine Addiction GERD - severe Chronic Muscle Spasms Chronic Neuralgia Pain in Hands L>R from  accident Osteoarthritis Paroxysmal Atrial Fibrillation  Past Surgical History: Reviewed history from 03/21/2010 and no changes required. 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 Long history of medical self-neglect and then sought medical treatment when obtained Medicare insurance  Smoking Status:  never Packs/Day:  1.0  Review of Systems General:  Complains of fatigue and  weakness; denies chills, fever, loss of appetite, malaise, sleep disorder, sweats, and weight loss. Eyes:  Denies blurring, discharge, double vision, eye irritation, eye pain, halos, itching, light sensitivity, red eye, vision loss-1 eye, and vision loss-both eyes. ENT:  Denies decreased hearing, difficulty swallowing, ear discharge, earache, hoarseness, nasal congestion, nosebleeds, postnasal drainage, ringing in ears, sinus pressure, and sore throat. CV:  Denies bluish discoloration of lips or nails, chest pain or discomfort, difficulty breathing at night, difficulty breathing while lying down, fainting, fatigue, leg cramps with exertion, lightheadness, near fainting, palpitations, shortness of breath with exertion, swelling of feet, swelling of hands, and weight gain. Resp:  Complains of shortness of breath and sputum productive; denies chest discomfort, chest pain with inspiration, cough, coughing up blood, excessive snoring, hypersomnolence, morning headaches, pleuritic, and wheezing; Colored Sputum. GI:  Denies abdominal pain, bloody stools, change in bowel habits, constipation, dark tarry stools, diarrhea, excessive appetite, gas, hemorrhoids, indigestion, loss of appetite, nausea, vomiting, vomiting blood, and yellowish skin color. GU:  Denies decreased libido, discharge, dysuria, erectile dysfunction, genital sores, hematuria, incontinence, nocturia, urinary frequency, and urinary hesitancy. MS:  Denies joint pain, joint redness, joint swelling, loss of strength, low back pain, mid back pain, muscle aches, muscle , cramps, muscle weakness, stiffness, and thoracic pain. Derm:  Denies changes in color of skin, changes in nail beds, dryness, excessive perspiration, flushing, hair loss, insect bite(s), itching, lesion(s), poor wound healing, and rash. Neuro:  Denies brief paralysis, difficulty with concentration, disturbances in coordination, falling down, headaches, inability to speak, memory loss,  numbness, poor balance, seizures, sensation of room spinning, tingling, tremors, visual disturbances, and weakness. Psych:  Denies alternate hallucination ( auditory/visual), anxiety, depression, easily angered, easily tearful, irritability, mental problems, panic attacks, sense of great danger, suicidal thoughts/plans, thoughts of violence, unusual visions or sounds, and thoughts /plans of harming others. Endo:  Denies cold intolerance, excessive hunger, excessive thirst, excessive urination, heat intolerance, polyuria, and weight change. Heme:  Denies abnormal bruising, bleeding, enlarge lymph nodes, fevers, pallor, and skin discoloration. Allergy:  Denies hives or rash, itching eyes, persistent infections, seasonal allergies, and sneezing.  Physical Exam  General:  Well-developed,well-nourished,in no acute distress; alert,appropriate and cooperative throughout examination Head:  Normocephalic and atraumatic without obvious abnormalities. No apparent alopecia or balding. Eyes:  No corneal or conjunctival inflammation noted. EOMI. Perrla. Funduscopic exam benign, without hemorrhages, exudates or papilledema. Vision grossly normal. Ears:  External ear exam shows no significant lesions or deformities.  Otoscopic examination reveals clear canals, tympanic membranes are intact bilaterally without bulging, retraction, inflammation or discharge. Hearing is grossly normal bilaterally. Nose:  swollen nasal turbinates bilateral Mouth:  dry mucus membranes, poor dentition Lungs:  tight lungs bilateral, exp wheezes heard, no use of accessory muscles at this time, rate 14-16 Heart:  Normal rate and regular rhythm. S1 and S2 normal  without gallop, murmur, click, rub or other extra sounds. Abdomen:  Bowel sounds positive, abdomen obese, soft and non-tender without masses, organomegaly or hernias noted. Neurologic:  No cranial nerve deficits noted. Station and gait are normal. Plantar reflexes are down-going  bilaterally. DTRs are symmetrical throughout. Sensory, motor and coordinative functions appear intact. Skin:  Intact without suspicious lesions or rashes Psych:  Cognition and judgment appear intact. Alert and cooperative with normal attention span and concentration. No apparent delusions, illusions, hallucinations   Problems:  Medical Problems Added: 1)  Dx of Pers Hx Noncompliance W/med Tx [facility]s [facility]  (ICD-V15.81) 2)  Dx of Obstructive Chronic Bronchitis With Exacerbation  (ICD-491.21)  Impression & Recommendations:  Problem # 1:  OBSTRUCTIVE CHRONIC BRONCHITIS WITH EXACERBATION (ICD-491.21) I strongly advised the patient to please stop smoking and using all tobacco products.  I told him that I was going to send him to a pulmonologist for an evaluation as to why he keeps coming in with requests for antibiotics every other week.  I told him that he needed to use his nebs at home as prescribed.  He had not been doing that.   I told him to use his inhalers as prescribed.  I am going to prescribe some more combivent.  Also, I explained to him that the albuterol inhaler was to be used as the rescue inhaler.  He refuses to use the medicine as prescribed.  I told him that combivent was not to be used p.r.n. but regularly as prescribed to prevent the exacerbations.  The patient verbalized clear understanding.    Also, about the note that the patient is requesting.  I told him that I could write a note saying that he has been treated in this clinic multiple times for COPD and an acute exacerbation of COPD but he needed to see a pulmonologist for further evaluation.  The patient declined to receive the recommended nebulizer treatment in the office today.  He said he has that at home.  I recommended that he allow [place] to treat him here but he declined.  Pt refused flu vaccine multiple times.   Problem # 2:  UNSPECIFIED ESSENTIAL HYPERTENSION (ICD-401.9)  His updated medication list for this problem  includes:    Dilt-cd 120 Mg Xr24h-cap (Diltiazem hcl coated beads) ..... One tablet once daily as needed for rapid heartrate    Metoprolol Succinate 25 Mg Xr24h-tab (Metoprolol succinate) .Marland Kitchen... Take one tablet by mouth daily   Continue to follow up with cardiology and take meds as prescribed...these were recommendations I gave him today.   Problem # 3:  Paroxysmal ATRIAL FIBRILLATION (ICD-427.31) Rate controlled..followed closely by cardiology.   His updated medication list for this problem includes:    Aspir-low 81 Mg Tbec (Aspirin) .Marland Kitchen... Take 2  tablet by mouth once a day    Dilt-cd 120 Mg Xr24h-cap (Diltiazem hcl coated beads) ..... One tablet once daily as needed for rapid heartrate    Amiodarone Hcl 200 Mg Tabs (Amiodarone hcl) .Marland Kitchen... Take one tab once daily.    Metoprolol Succinate 25 Mg Xr24h-tab (Metoprolol succinate) .Marland Kitchen... 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 2 puffs inhaled two times a day 2)  Aspir-low 81 Mg Tbec (Aspirin) .... Take 2  tablet by mouth once a day 3)  Nebulizer Misc (Nebulizers) .... To use as directed 4)  Duoneb 0.5-2.5 (  3) Mg/48ml Soln (Ipratropium-albuterol) .... To use in nebulizer two times a day as needed 5)  Mag-ox 400 400 Mg Tabs (Magnesium oxide) .... Sig 2 tablets by mouth 1-2 times aday as needed for cramps  reduce dosage as cramps go away 6)  Zegerid 40-1100 Mg Caps (Omeprazole-sodium bicarbonate) .Marland Kitchen.. 1 by mouth daily 7)  Ranitidine Hcl 300 Mg Tabs (Ranitidine hcl) .... Sig 1 tablet po once daily and may occasional take a 2nd dose 8)  Lortab 10-500 Mg Tabs (Hydrocodone-acetaminophen) .... Sig 1 by mouth q6-8hrs as needed for pain. do not take w/in 6-8hrs of driving may cause sedation. 9)  Silver Sulfadiazine 1 % Crea (Silver sulfadiazine) .... Sig apply 2x a day for burn 10)  Advair Diskus  500-50 Mcg/dose Aepb (Fluticasone-salmeterol) .Marland Kitchen.. 1 puff by mouth two times a day 11)  Nitrolingual 0.4 Mg/spray Soln (Nitroglycerin) .... Use as directed for chest pain 12)  Simvastatin 20 Mg Tabs (Simvastatin) .... Take one tablet by mouth daily at bedtime 13)  Neurontin 300 Mg Caps (Gabapentin) .... Take 1 by mouth two times a day as needed nerve pain 14)  Dilt-cd 120 Mg Xr24h-cap (Diltiazem hcl coated beads) .... One tablet once daily as needed for rapid heartrate 15)  Amiodarone Hcl 200 Mg Tabs (Amiodarone hcl) .... Take one tab once daily. 16)  Pradaxa 150 Mg Caps (Dabigatran etexilate mesylate) .... Take 1 tablet by mouth two times a day 17)  Metoprolol Succinate 25 Mg Xr24h-tab (Metoprolol succinate) .... Take one tablet by mouth daily 18)  Ventolin Hfa 108 (90 Base) Mcg/act Aers (Albuterol sulfate) .... 2 puffs every 3 hours as needed for cough and wheezing and sob 19)  Augmentin 875-125 Mg Tabs (Amoxicillin-pot clavulanate) .... Take 1 by mouth two times a day 20)  Pantoprazole Sodium 40 Mg Tbec (Pantoprazole sodium) .... Take 1 by mouth daily 21)  Lortab 5-500 Mg Tabs (Hydrocodone-acetaminophen) .... Take 1 tab by mouth every 6 hours as needed severe pain - will cause drowsiness.  do not drive on this medication  Patient Instructions: 1)  Tobacco is very bad for your health and your loved ones! You Should stop smoking!. 2)  Stop Smoking Tips: Choose a Quit date. Cut down before the Quit date. decide what you will do as a substitute when you feel the urge to smoke(gum,toothpick,exercise). 3)  It is important that you exercise regularly at least 20 minutes 5 times a week. If you develop chest pain, have severe difficulty breathing, or feel very tired , stop exercising immediately and seek medical attention. 4)  The patient was informed that there is no on-call provider or services available at this clinic during off-hours (when the clinic is closed).  If the patient developed a problem or  concern that required immediate attention, the patient was advised to go the the nearest available urgent care or emergency department for medical care.  The patient verbalized understanding.    5)  Return tomorrow at 11 am and have blood work drawn.  Prescriptions: LORTAB 5-500 MG TABS (HYDROCODONE-ACETAMINOPHEN) take 1 tab by mouth every 6 hours as needed severe pain - WILL CAUSE DROWSINESS.  DO NOT DRIVE ON THIS MEDICATION  #20 x 0   Entered and Authorized by:   Standley Dakins MD   Signed by:   Standley Dakins MD on 11/28/2010   Method used:   Print then Give to Patient   RxID:   [id number] PANTOPRAZOLE SODIUM 40 MG TBEC (PANTOPRAZOLE SODIUM) take 1 by mouth daily  #  30 x 3   Entered and Authorized by:   Standley Dakins MD   Signed by:   Standley Dakins MD on 11/28/2010   Method used:   Electronically to        [facility] [address] (retail)       [address], [address]       [place], [place]  [address]       Ph: [phone]       Fax: [phone]   RxID:   [id number] AUGMENTIN 875-125 MG TABS (AMOXICILLIN-POT CLAVULANATE) take 1 by mouth two times a day  #20 x 0   Entered and Authorized by:   Standley Dakins MD   Signed by:   Standley Dakins MD on 11/28/2010   Method used:   Electronically to        [facility] [address] (retail)       [address], [address]       [place], [place]  [address]       Ph: [phone]       Fax: [phone]   RxID:   [id number]
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
## 2011-01-09 NOTE — Progress Notes (Signed)
  Phone Note Outgoing Call   Call placed by: jane breitmeier Call placed to: Specialist Summary of Call: I  made Mr. Pantaleo an appointment for January 16th at 12:15 with [facility], Dr. Welton Flakes.   I called the patient and made him aware of the appointment.   Initial call taken by: Rosine Beat,  November 29, 2010 3:35 PM

## 2011-01-09 NOTE — Miscellaneous (Signed)
Summary: appointment for pulmonlogist   Clinical Lists Changes  The patient had an appointment this morning to see Dr. Welton Flakes for his COPD and Mr. Bottomley stated that the front desk personnel at Dr. Milta Deiters office was rude to him and he wanted another appointment made with a different doctor for his COPD.  I called [facility] and got him an appointment with Dr. Meredeth Ide on 01/06/11 at 9:15.   Verlon Au from Dr. Milta Deiters office called and stated the patient arrived late in the office for his appointment and did not have his insurance card and they informed him that they needed to reschedule and when they stated they needed to reschedule his appointment he became rude and stormed out without being seen.  He subsequently showed up over here at the Clinic at [facility] demanding to be seen and to have a new appointment made with a different pulmonologist.  The patient is refusing to go back to Dr. Milta Deiters office./jbb

## 2011-01-09 NOTE — Letter (Signed)
Summary: Work Writer at [employer]. [address]   [place], [place] [address]   Phone: [phone]  Fax: [phone]    Today's Date: November 28, 2010  Name of Patient: [name]  The above named patient had a medical visit today at: 11:00 am   Please take this into consideration when reviewing the time away from work. Please excuse patient from driving more than one day per week. Patient needs to refrain from strenous activity and heavy lifting. If there is any problems or concerns, please feel free to contact our office at [facility] at [phone].    Sincerely yours,  Dr. Julien Nordmann [facility]

## 2011-01-09 NOTE — Assessment & Plan Note (Signed)
Summary: Nurse Visit ( Blood Draw )   Preventive Screening-Counseling & Management  Pt. arrived at tthe clinic for a Blood Draw and Urine test. Pt. will be notified with the results.

## 2011-01-09 NOTE — Miscellaneous (Signed)
  Clinical Lists Changes  Medications: Removed medication of RANITIDINE HCL 300 MG TABS (RANITIDINE HCL) sig 1 tablet po once daily and may occasional take a 2nd dose Removed medication of MAG-OX 400 400 MG TABS (MAGNESIUM OXIDE) sig 2 tablets by mouth 1-2 times aday as needed for cramps  reduce dosage as cramps go away Removed medication of LORTAB 10-500 MG TABS (HYDROCODONE-ACETAMINOPHEN) sig 1 by mouth q6-8hrs as needed for pain. Do not take w/in 6-8hrs of driving may cause sedation.

## 2011-01-09 NOTE — Progress Notes (Signed)
  Phone Note Call from Patient   Caller: Spouse Details for Reason: Prescription authorization Summary of Call: Patient's wife called stating her husband needs prescription authorization by Dr. Laural Benes to receive the following medications.  The prescription can be filled at the [facility] here is [place].  The prescriptions are Omeprsodium Bicarb (Zegerid,generic), Lortab, Soma (Amitriptylin,(generic), Proair Hfa Aer (Albuterol Sulfate inhale aero, generic).  If you have any questions you can contact the patient at [phone]. Initial call taken by: Dorna Leitz,  January 01, 2011 1:39 PM    New/Updated Medications: AMITRIPTYLINE HCL 25 MG TABS (AMITRIPTYLINE HCL) 1 tab at HS to help with sleep and for chronic pain Prescriptions: AMITRIPTYLINE HCL 25 MG TABS (AMITRIPTYLINE HCL) 1 tab at HS to help with sleep and for chronic pain  #30 x 3   Entered by:   Levonne Spiller EMT-P   Authorized by:   Standley Dakins MD   Signed by:   Levonne Spiller EMT-P on 01/01/2011   Method used:   Electronically to        [facility] [address] (retail)       [address], [address]       [place], [place]  [address]       Ph: [phone]       Fax: [phone]   RxID:   [id number] VENTOLIN HFA 108 (90 BASE) MCG/ACT AERS (ALBUTEROL SULFATE) 2 puffs every 3 hours as needed for cough and wheezing and SOB  #1 x 4   Entered by:   Levonne Spiller EMT-P   Authorized by:   Standley Dakins MD   Signed by:   Levonne Spiller EMT-P on 01/01/2011   Method used:   Electronically to        [facility] [address] (retail)       [address], [address]       [place], [place]  [address]       Ph: [phone]       Fax: [phone]   RxID:   [id number] ZEGERID 40-1100 MG CAPS (OMEPRAZOLE-SODIUM BICARBONATE) 1 by mouth daily  #30 x 4   Entered by:   Levonne Spiller EMT-P   Authorized by:   Standley Dakins MD   Signed by:   Levonne Spiller  EMT-P on 01/01/2011   Method used:   Electronically to        [facility] [address] (retail)       [address], [address]       [place], [place]  [address]       Ph: [phone]       Fax: [phone]   RxID:   [id number]

## 2011-01-09 NOTE — Letter (Signed)
Summary: Generic Letter  [facility]  [address]   [place], [place] [address]   Phone: [phone]  Fax: [phone]    11/29/2010  [facility] [address] [place], [place]  [address]     TO WHOM IT MAY CONCERN:  This document has been prepared at the above patient's request and with his permission. This patient has been treated at this clinic for COPD and multiple exacerbations of chronic bronchitis and obstructive pulmonary disease.  The patient is being referred to a qualified pulmonary specialist for a comprehensive evaluation and management of this progressive condition.   This patient has also been referred to a qualified cardiologist for an evaluation and treatment for paroxysmal atrial fibrillation and atypical chest pain. He is currently under the care of [facility].    The patient is also being treated for mixed hyperlipidemia and hypertension.  He is being treated with medications and counseled on diet and lifestyle changes.             Sincerely,   [name] MD

## 2011-01-09 NOTE — Progress Notes (Signed)
Summary: Called Pt  Phone Note Outgoing Call Call back at [facility] Phone [phone]   Call placed by: Harlon Flor,  November 21, 2010 4:46 PM Call placed to: Patient Summary of Call: Called to schedule appt.  Wife stated that she would call back tomorrow to schedule. Initial call taken by: Harlon Flor,  November 21, 2010 4:47 PM

## 2011-01-09 NOTE — Miscellaneous (Signed)
  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 (ICD-250.02) Medications: Added new medication of METFORMIN HCL 500 MG XR24H-TAB (METFORMIN HCL) take 1 by mouth every morning with breakfast x 1 week, then take 1 by mouth BIDAC x 1 week, then if tolerated take 2 tabs by mouth BIDAC - Signed Rx of METFORMIN HCL 500 MG XR24H-TAB (METFORMIN HCL) take 1 by mouth every morning with breakfast x 1 week, then take 1 by mouth BIDAC x 1 week, then if tolerated take 2 tabs by mouth BIDAC;  #120 x 3;  Signed;  Entered by: Standley Dakins MD;  Authorized by: Standley Dakins MD;  Method used: Electronically to [facility] [address], [place], [place], [place]  [address], Ph: [phone], Fax: [phone]    Prescriptions: METFORMIN HCL 500 MG XR24H-TAB (METFORMIN HCL) take 1 by mouth every morning with breakfast x 1 week, then take 1 by mouth BIDAC x 1 week, then if tolerated take 2 tabs by mouth BIDAC  #120 x 3   Entered and Authorized by:   Standley Dakins MD   Signed by:   Standley Dakins MD on 11/30/2010   Method used:   Electronically to        [facility] [address] (retail)       [address], [address]       [place], [place]  [address]       Ph: [phone]       Fax: [phone]   RxID:   [id number]

## 2011-01-09 NOTE — Progress Notes (Signed)
  Phone Note Call from Patient   Reason for Call: Acute Illness Summary of Call: [name] called from [place] stating that yesterday he had sudden onset of rapid HR 120, hypertension 180's systolic with associated nausea and arm pain. He layed down and rested.  After about 30 minutes he checked his blood pressure again and both HR and BP were lower, 150's HR 100.  He had just eaten a usual breakfast before this occured. Today, he is feeling fine and has had no recurrence of this. Last evening he was fine also and slept well.    I have advised him to seek medical attention at the nearest ER in [place] if symptoms reoccur.  If he does not, he is to see Dr. Mariah Milling when he returns in one week.  I will pass this on to Dr. Windell Hummingbird nurse to post for follow-up appointment. Initial call taken by: Joni Reining NP     Appended Document:  We have talked to him aabout having an echo and stress test. He is a longtime smoker and needs workup. very high risk of CAD

## 2011-01-09 NOTE — Procedures (Signed)
Summary: Summary Report  Summary Report   Imported By: Erle Crocker 12/25/2010 16:24:32  _____________________________________________________________________  External Attachment:    Type:   Image     Comment:   External Document

## 2011-01-09 NOTE — Medication Information (Signed)
Summary: Tax adviser   Imported By: Dorna Leitz 11/17/2010 15:25:17  _____________________________________________________________________  External Attachment:    Type:   Image     Comment:   External Document

## 2011-01-09 NOTE — Progress Notes (Signed)
Summary: Medication Update   The patient walked up to me in the [facility] store and handed me a bag of prescription with atorvastatin 40 mg.  He said that he just picked this up and he said that he is not going to take the lipitor because of the long list of possible side effects.  He said that he had taken a simvastatin tablet for the first time last night and that he had sore muscles all night long.  I told him that maybe they were not aware that he was already taking simvastatin when he was prescribed the atorvastatin.  I told him NOT to take both statins.  He said that he was not going to take any of them.  I tried to explain the benefits of taking statins for a diabetic patient and I explained to him the risks of not taking them.  The patient verbalized clear understanding.  I asked him to call his cardiologist and let them know about his concerns.  [name], M.D., F.A.A.F.P.  December 05, 2010

## 2011-01-09 NOTE — Letter (Signed)
Summary: CURRENT MEDICATIONS  CURRENT MEDICATIONS   Imported By: Rosine Beat 11/28/2010 17:10:04  _____________________________________________________________________  External Attachment:    Type:   Image     Comment:   External Document
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
## 2011-01-09 NOTE — Progress Notes (Signed)
Summary: Heart monitor  Phone Note Call from Patient Call back at [phone]   Caller: Self Call For: [place] Summary of Call: Pt would like a call back regarding the heart monitor.  Was told to send it back and does not understand how is new medication is going to be monitored when we are not monitoring his heart. Initial call taken by: Harlon Flor,  November 18, 2010 10:41 AM  Follow-up for Phone Call        Spoke to pt, notified him we probably can't renew his monitor after 30 days, however, we change meds on pt's routinely and monitor them without the use of holter. Told pt will find out from Dr. Mariah Milling when he would want pt to f/u and call pt back. Follow-up by: Lanny Hurst RN,  November 18, 2010 4:28 PM  Additional Follow-up for Phone Call Additional follow up Details #1::        Would schedule for follow up in the next few weeks. Also, he should come in for samples of pradaxa. We have some that will expire in early 2012     Appended Document: Heart monitor pt notified of above, samples of pradaxa left at front for pick up. office will call pt to schedule f/u.

## 2011-01-09 NOTE — Assessment & Plan Note (Signed)
Summary: GIVING OUT OF BREATH-WANTS A STRONGER BREATHING MEDICATION/JBB   Vital Signs:  Patient profile:   65 year old male Height:      71.5 inches Weight:      286 pounds BMI:     39.48 O2 Sat:      93 % on Room air Temp:     97.4 degrees F oral Pulse rate:   93 / minute Pulse rhythm:   regular Resp:     22 per minute BP sitting:   129 / 83  (right arm)  Vitals Entered By: Levonne Spiller EMT-P (December 23, 2010 1:20 PM)  O2 Flow:  Room air CC: S.O.B Is Patient Diabetic? Yes Did you bring your meter with you today? No Pain Assessment Patient in pain? no      CBG Result 101  Does patient need assistance? Functional Status Self care Ambulation Normal Comments Pt. is a smoker. 1 pack per day.   Chief Complaint:  S.O.B.  History of Present Illness: The patient presented today after he walked out of the office of his scheduled pulmonary appointment.  He arrived late and didn't bring his insurance information and apparently they tried to reschedule patient and he became upset.  The patient presents here now complaining of SOB and coughing up yellow sputum.  Pt still smoking cigarettes daily.  PT says that he needs a "stronger inhaler".  Pt says that he is using the orange one 8 times per day at up to 4 puffs at a time.  He is unsure if he is using the ventolin rescue inhaler.  Pt says he is not using the combivent inhaler as prescribed.  He says that he refuses to go back to the office of that pulmonologist and tells [place] to make him another appointment with a different lung doctor. PT says that his heart rate has been stable lately.  Pt says that he is not using his home nebulizer machine.  PT says that he is going to see his heart doctor next week.  PT says that he is checking his blood sugar and his bs this morning was 101.  Pt says that he tolerates the Glucophage but has not been losing weight.  PT still riding around [place] in an electric shopping cart.  Pt denies CP.  Pt says  that he wheezes more at night.  Pt says that he is still not interested in a flu vaccine.  Pt denies fever, chills, headache, weight loss and blood in sputum.     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 Long history of medical self-neglect and then sought medical treatment when obtained Medicare insurance  Risk Factors: Smoking Status: current (12/04/2010) Packs/Day: 1.5 (12/23/2010)  Past Medical History: COPD - Emphysema Chronic Active Nicotine Addiction GERD - severe Chronic Muscle Spasms Chronic Neuralgia Pain in Hands L>R from accident Osteoarthritis  Paroxysmal Atrial Fibrillation NON COMPLIANCE  And History of Confrontations with Staff  Past Surgical History: Reviewed history from 03/21/2010 and no changes required. Knee surgery Cholecystectomy Tonsillectomy  Family History: Reviewed history from 11/28/2010 and no changes required. Father died of Leukemia had heart disease and lung probs but never smoked or drank sister died of melanoma  2 brothers of bad health  Social History: Reviewed history from 11/28/2010 and no changes required. 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 Long history of medical self-neglect and then sought medical  treatment when obtained Medicare insurance  Packs/Day:  1.5  Review of Systems General:  Complains of fatigue; denies chills, fever, loss of appetite, malaise, sleep disorder, sweats, weakness, and weight loss. Eyes:  Denies blurring, discharge, double vision, eye irritation, eye pain, halos, itching, light sensitivity, red eye, vision loss-1 eye, and vision loss-both eyes. ENT:  Denies decreased hearing, difficulty swallowing, ear discharge, earache, hoarseness, nasal congestion, nosebleeds, postnasal drainage, ringing in ears, sinus pressure, and sore throat. CV:  Complains of difficulty breathing at night, difficulty breathing while lying down, and shortness of breath with exertion; denies bluish discoloration of lips or nails, chest pain or discomfort, fainting, fatigue, leg cramps with exertion, lightheadness, near fainting, palpitations, swelling of feet, swelling of hands, and weight gain. Resp:  Complains of shortness of breath and wheezing; denies chest discomfort, chest pain with inspiration, cough, coughing up blood, excessive snoring, hypersomnolence, morning headaches, pleuritic, and sputum productive. GI:  Denies abdominal pain, bloody stools, change in bowel habits, constipation, dark tarry stools, diarrhea, excessive appetite, gas, hemorrhoids, indigestion, loss of appetite, nausea, vomiting, vomiting blood, and yellowish skin color. GU:  Denies decreased libido, discharge, dysuria, erectile dysfunction, genital sores, hematuria, incontinence, nocturia, urinary frequency, and urinary hesitancy. MS:  Denies joint pain, joint redness, joint swelling, loss of strength, low back pain, mid back pain, muscle aches, muscle , cramps, muscle weakness, stiffness, and thoracic pain. Derm:  Denies changes in color of skin, changes in nail beds, dryness, excessive perspiration, flushing, hair loss, insect bite(s), itching, lesion(s), poor wound healing, and rash. Neuro:  Denies brief paralysis,  difficulty with concentration, disturbances in coordination, falling down, headaches, inability to speak, memory loss, numbness, poor balance, seizures, sensation of room spinning, tingling, tremors, visual disturbances, and weakness. Psych:  Denies alternate hallucination ( auditory/visual), anxiety, depression, easily angered, easily tearful, irritability, mental problems, panic attacks, sense of great danger, suicidal thoughts/plans, thoughts of violence, unusual visions or sounds, and thoughts /plans of harming others. Endo:  Denies cold intolerance, excessive hunger, excessive thirst, excessive urination, heat intolerance, polyuria, and weight change. Heme:  Denies abnormal bruising, bleeding, enlarge lymph nodes, fevers, pallor, and skin discoloration. Allergy:  Denies hives or rash, itching eyes, persistent infections, seasonal allergies, and sneezing.  Physical Exam  General:  Well-developed,well-nourished,in no acute distress; alert,appropriate and cooperative throughout examination Head:  Normocephalic and atraumatic without obvious abnormalities. No apparent alopecia or balding. Eyes:  No corneal or conjunctival inflammation noted. EOMI. Perrla. Funduscopic exam benign, without hemorrhages, exudates or papilledema. Vision grossly normal. Ears:  External ear exam shows no significant lesions or deformities.  Otoscopic examination reveals clear canals, tympanic membranes are intact bilaterally without bulging, retraction, inflammation or discharge. Hearing is grossly normal bilaterally. Nose:  mucosal erythema and mucosal edema.   Mouth:  Oral mucosa and oropharynx without lesions or exudates.  poor dentition Neck:  No deformities, masses, or tenderness noted. Lungs:  anterior expiratory wheezes heard bilaterally otherwise full BS bilat and no crackles heard. pulse ox improved and wheezing improved after neb treatment in office Heart:  normal rate, regular rhythm, no murmur, no gallop, no  rub, and no JVD.   Abdomen:  Bowel sounds positive,abdomen soft and non-tender without masses, organomegaly or hernias noted. Neurologic:  No cranial nerve deficits noted. Station and gait are normal. Plantar reflexes are down-going bilaterally. DTRs are symmetrical throughout. Sensory, motor and coordinative functions appear intact. Cervical Nodes:  No lymphadenopathy noted Psych:  Cognition and judgment appear intact. Alert and cooperative with normal attention span and concentration. No apparent delusions, illusions, hallucinations   Impression & Recommendations:  Problem # 1:  COPD (ICD-496)  His updated medication list for this problem includes:    Combivent 18-103 Mcg/act Aero (Ipratropium-albuterol) .Marland Kitchen... 2 puffs inhaled two times a day    Duoneb 0.5-2.5 (3) Mg/30ml Soln (Ipratropium-albuterol) .Marland Kitchen... To use in nebulizer two times a day as needed    Advair Diskus 250-50 Mcg/dose Aepb (Fluticasone-salmeterol) .Marland Kitchen... 1 inh two times a day    Ventolin Hfa 108 (90 Base) Mcg/act Aers (Albuterol sulfate) .Marland Kitchen... 2 puffs every 3 hours as needed for cough and wheezing and sob  Orders: (Y7829) Tobacco (smoke) use Cessation Intervention-Counseling (F6213)  Problem # 2:  DIAB W/O MENTION COMP TYPE II/UNS TYPE UNCNTRL (ICD-250.02) Assessment: Improved  His updated medication list for this problem includes:    Aspir-low 81 Mg Tbec (Aspirin) .Marland Kitchen... Take 2  tablet by mouth once a day    Metformin Hcl 500 Mg Xr24h-tab (Metformin hcl) .Marland Kitchen... Take 1 by mouth every morning with breakfast x 1 week, then take 1 by mouth bidac x 1 week, then if tolerated take 2 tabs by mouth bidac  Problem # 3:  ATRIAL FIBRILLATION (ICD-427.31)  His updated medication list for this problem includes:    Aspir-low 81 Mg Tbec (Aspirin) .Marland Kitchen... Take 2  tablet by mouth once a day    Dilt-cd 120 Mg Xr24h-cap (Diltiazem hcl coated beads) ..... One tablet once daily as needed for rapid heartrate    Amiodarone Hcl 200 Mg Tabs  (Amiodarone hcl) .Marland Kitchen... Take one tab once daily.    Metoprolol Succinate 25 Mg Xr24h-tab (Metoprolol succinate) .Marland Kitchen... Take one tablet by mouth daily F/U with cardiology for stress test /echocardiogram  Problem # 4:  CIGARETTE SMOKER (ICD-305.1)  Orders: (Y8657) Tobacco (smoke) use Cessation Intervention-Counseling (Q4696)  The patient was counseled and advised to stop using all tobacco products.  Medical assistance was offered and the patient was encouraged to call 1-800-QUIT-NOW to get a smoking cessation coach.     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 The patient verbalized clear understanding.  I also spoke with his wife and explained to them that it was very important that he be seen by the pulmonologist and that because they missed that appointment today and because it is going to take [place] more time to get him another appointment that it was not a good thing for his overall medical care.  I told them that if his symptoms worsened that he needed to go to the ER right away.  The patient verbalized clear understanding.  Wife did also.   Complete Medication List: 1)  Combivent 18-103 Mcg/act Aero (Ipratropium-albuterol) .... 2 puffs inhaled two times a day 2)  Aspir-low 81 Mg Tbec (  Aspirin) .... Take 2  tablet by mouth once a day 3)  Nebulizer Misc (Nebulizers) .... To use as directed 4)  Duoneb 0.5-2.5 (3) Mg/57ml Soln (Ipratropium-albuterol) .... To use in nebulizer two times a day as needed 5)  Zegerid 40-1100 Mg Caps (Omeprazole-sodium bicarbonate) .Marland Kitchen.. 1 by mouth daily 6)  Silver Sulfadiazine 1 % Crea (Silver sulfadiazine) .... Sig apply 2x a day for burn 7)  Advair Diskus 250-50 Mcg/dose Aepb (Fluticasone-salmeterol) .Marland Kitchen.. 1 inh two times a day 8)  Lipitor 40 Mg Tabs (Atorvastatin calcium) .... Take one tablet by mouth daily. 9)  Neurontin 300 Mg Caps (Gabapentin) .... Take 1 by mouth two  times a day as needed nerve pain 10)  Dilt-cd 120 Mg Xr24h-cap (Diltiazem hcl coated beads) .... One tablet once daily as needed for rapid heartrate 11)  Amiodarone Hcl 200 Mg Tabs (Amiodarone hcl) .... Take one tab once daily. 12)  Pradaxa 150 Mg Caps (Dabigatran etexilate mesylate) .... Take 1 tablet by mouth two times a day 13)  Metoprolol Succinate 25 Mg Xr24h-tab (Metoprolol succinate) .... Take one tablet by mouth daily 14)  Ventolin Hfa 108 (90 Base) Mcg/act Aers (Albuterol sulfate) .... 2 puffs every 3 hours as needed for cough and wheezing and sob 15)  Pantoprazole Sodium 40 Mg Tbec (Pantoprazole sodium) .... Take 1 by mouth daily 16)  Lortab 5-500 Mg Tabs (Hydrocodone-acetaminophen) .... Take 1 tab by mouth every 6 hours as needed severe pain - will cause drowsiness.  do not drive on this medication 17)  Metformin Hcl 500 Mg Xr24h-tab (Metformin hcl) .... Take 1 by mouth every morning with breakfast x 1 week, then take 1 by mouth bidac x 1 week, then if tolerated take 2 tabs by mouth bidac 18)  Augmentin 875-125 Mg Tabs (Amoxicillin-pot clavulanate) .... Take 1 by mouth two times a day 19)  Guiatuss Ac 100-10 Mg/44ml Syrp (Guaifenesin-codeine) .... Take 1 teaspoon by mouth every 6 hours as needed severe cough and congestion: caution will cause drowsiness  Patient Instructions: 1)  Please make your new appointment with your pulmonologist as scheduled.  Go to the ER if your symptoms worsen or don't improve in the next 8 hours.  Go to the ER if you get palpatations. 2)  Tobacco is very bad for your health and your loved ones! You Should stop smoking!. 3)  Stop Smoking Tips: Choose a Quit date. Cut down before the Quit date. decide what you will do as a substitute when you feel the urge to smoke(gum,toothpick,exercise). 4)  Check your blood sugars regularly. If your readings are usually above :200 or below 70 you should contact our office. 5)  See your eye doctor yearly to check for diabetic  eye damage. 6)  Check your feet each night for sore areas, calluses or signs of infection. 7)  Please use your inhalers only as prescribed.  Use the albuterol as your rescue inhaler. Prescriptions: GUIATUSS AC 100-10 MG/5ML SYRP (GUAIFENESIN-CODEINE) take 1 teaspoon by mouth every 6 hours as needed severe cough and congestion: Caution Will Cause Drowsiness  #60 ML x 0   Entered and Authorized by:   Standley Dakins MD   Signed by:   Standley Dakins MD on 12/23/2010   Method used:   Printed then faxed to ...       [facility] [address] (retail)       [address], [address]       [place]  [place], [place]  [address]       Ph: [phone]       Fax: [phone]   RxID:   [id number] ADVAIR DISKUS 250-50 MCG/DOSE AEPB (FLUTICASONE-SALMETEROL) 1 inh two times a day  #1 x 0   Entered and Authorized by:   Standley Dakins MD   Signed by:   Standley Dakins MD on 12/23/2010   Method used:   Electronically to        [facility] [address] (retail)       [address], [address]       [place], [place]  [address]       Ph: [phone]       Fax: [phone]   RxID:   [id number] AUGMENTIN 875-125 MG TABS (AMOXICILLIN-POT CLAVULANATE) take 1 by mouth two times a day  #14 x 0   Entered and Authorized by:   Standley Dakins MD   Signed by:   Standley Dakins MD on 12/23/2010   Method used:   Electronically to        [facility] [address] (retail)       [address], [address]       [place], [place]  [address]       Ph: [phone]       Fax: [phone]   RxID:   [id number]    Medication Administration  Medication # 1:    Medication: Albuterol Sulfate Sol 3mg  unit dose    Diagnosis: 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

## 2011-01-09 NOTE — Letter (Signed)
Summary: Medical Record Release  Medical Record Release   Imported By: Harlon Flor 11/28/2010 11:53:06  _____________________________________________________________________  External Attachment:    Type:   Image     Comment:   External Document

## 2011-01-09 NOTE — Miscellaneous (Signed)
  Clinical Lists Changes  Problems: Removed problem of FECAL URGENCY (ZOX-096.04) Removed problem of FLANK PAIN, RIGHT (ICD-789.09) Removed problem of URINARY URGENCY (VWU-981.19) Removed problem of GROSS HEMATURIA (ICD-599.71) Removed problem of LEUKOCYTOSIS UNSPECIFIED (ICD-288.60) Removed problem of MUSCLE SPASM (ICD-728.85) Removed problem of ABDOMINAL CRAMPS (ICD-789.00)

## 2011-01-09 NOTE — Assessment & Plan Note (Signed)
Summary: SICK/EVM   Vital Signs:  Patient profile:   65 year old male Height:      71 inches Weight:      283 pounds BMI:     39.61 O2 Sat:      94 % on Room air Temp:     97.5 degrees F oral Pulse rate:   52 / minute Pulse rhythm:   regular Resp:     20 per minute BP sitting:   127 / 75  (right arm)  Vitals Entered By: Levonne Spiller EMT-P (December 30, 2010 12:20 PM)  O2 Flow:  Room air CC: S.O.B. with excertion Pain Assessment Patient in pain? no       Does patient need assistance? Functional Status Self care Ambulation Normal Comments Pt. is a smoker. 1 pack per day   Chief Complaint:  S.O.B. with excertion.  History of Present Illness: The patient presented today as a walk-in to the clinic complaining of progressive shortness of breath and also complaining of weight gain. The patient reports that his shortness of breath occurs with exertion. He reports that he has been using his asthma inhalers and using the nebulizer treatments at night before bed. He reports that this does seem to help with the coughing and shortness of breath at night. He reports that with any type of exertional activities he has shortness of breath. He reports that he has not felt presyncopal and reports no full syncopal episodes. The patient reports no chest pain. The patient reports that he has been fatigued. He reports that his blood sugars have been between 130 and 200.  the patient reports that he has experienced no blood sugars less than 100. The patient reports that his heart rate has been between 50 and 60.   I advised the patient to go to the emergency department to have an evaluation and be hospitalized for a complete workup of this progressive shortness of breath but the patient absolutely refuses and reports that he is not going to the hospital at all unless he is dying. I explained to the patient that he could be having some serious cardiac issues in addition to the respiratory issues and he  verbalized understanding.  The patient agreed to see his cardiologist if we could get him an appointment today. I also counseled the patient regarding the proper use of his nebulizer machine in that he can use it every 4 hours throughout the day. He verbalized understanding. I called his cardiologist's office and they agree to see him today at 2 PM. I also arranged for the patient to go to radiology to get a PA and lateral chest x-ray. We did an EKG in the office today and the results are scanned in the office. The results were consistent with bradycardia. His pulse was 50. I would like for the patient to be seen by cardiology to determine if he is having significant bradycardia and also for him to have an echo & CST.   Preventive Screening-Counseling & Management  Alcohol-Tobacco     Smoking Status: current     Smoking Cessation Counseling: yes     Smoke Cessation Stage: precontemplative     Packs/Day: 1.0     Tobacco Counseling: to quit use of tobacco products     Passive Smoke Counseling: to avoid passive smoke exposure  Allergies (verified): No Known Drug Allergies  Past History:  Family History: Last updated: 2010-12-09 Father died of Leukemia had heart disease and lung probs but never  smoked or drank sister died of melanoma  2 brothers of bad health  Social History: Last updated: 11/28/2010 Married Current Smoker - not willing to quit at this time 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 Long history of medical self-neglect and then sought medical treatment when obtained Medicare insurance  Risk Factors: Smoking Status: current (12/30/2010) Packs/Day: 1.0 (12/30/2010)  Past Medical History: Reviewed history from 12/23/2010 and no changes required. 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 Knee surgery Cholecystectomy Tonsillectomy  Family History: Reviewed history from 11/28/2010 and no changes required. Father died of Leukemia had heart disease and lung probs but never smoked or drank sister died of melanoma  2 brothers of bad health  Social History: Reviewed history from 11/28/2010 and no changes required. 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 Long history of medical self-neglect and then sought medical treatment when obtained Medicare insurance  Packs/Day:  1.0  Review of Systems General:  Complains of fatigue; denies chills, fever, loss of appetite, malaise, sleep disorder, sweats, weakness, and weight loss. Eyes:  Denies blurring, discharge, double vision, eye irritation, eye pain, halos, itching, light sensitivity, red eye, vision loss-1 eye, and vision loss-both eyes. ENT:  Complains of hoarseness; Voice comes and goes.. CV:  Complains of shortness of breath with exertion; denies bluish discoloration of lips or nails, chest pain or discomfort, difficulty breathing at night, difficulty breathing while lying down, fainting, fatigue, leg cramps with exertion, lightheadness, near fainting, palpitations, swelling of feet, swelling of hands, and weight gain. Resp:  Complains of cough, shortness of breath, and wheezing; denies chest discomfort, chest pain with inspiration, coughing up blood, excessive snoring, hypersomnolence, morning headaches, pleuritic, and sputum productive. GI:  Denies abdominal pain, bloody stools, change in bowel habits, constipation, dark tarry stools, diarrhea, excessive appetite, gas, hemorrhoids, indigestion, loss of appetite, nausea, vomiting, vomiting blood, and yellowish skin  color. GU:  Denies decreased libido, discharge, dysuria, erectile dysfunction, genital sores, hematuria, incontinence, nocturia, urinary frequency, and urinary hesitancy. MS:  Denies joint pain, joint redness, joint swelling, loss of strength, low back pain, mid back pain, muscle aches, muscle , cramps, muscle weakness, stiffness, and thoracic pain. Derm:  Denies changes in color of skin, changes in nail beds, dryness, excessive perspiration, flushing, hair loss, insect bite(s), itching, lesion(s), poor wound healing, and rash. Neuro:  Denies brief paralysis, difficulty with concentration, disturbances in coordination, falling down, headaches, inability to speak, memory loss, numbness, poor balance, seizures, sensation of room spinning, tingling, tremors, visual disturbances, and weakness. Psych:  Denies alternate hallucination ( auditory/visual), anxiety, depression, easily angered, easily tearful, irritability, mental problems, panic attacks, sense of great danger, suicidal thoughts/plans, thoughts of violence, unusual visions or sounds, and thoughts /plans of harming others. Endo:  Denies cold intolerance, excessive hunger, excessive thirst, excessive urination, heat intolerance, polyuria, and weight change. Heme:  Denies abnormal bruising, bleeding, enlarge lymph nodes, fevers, pallor, and skin discoloration. Allergy:  Denies hives or rash, itching eyes, persistent infections, seasonal allergies, and sneezing.  Physical Exam  General:  Well-developed,well-nourished,in no acute distress; alert,appropriate and cooperative throughout examination  Head:  Normocephalic and atraumatic without obvious abnormalities. No apparent alopecia or balding. Eyes:  No corneal or conjunctival inflammation noted. EOMI. Perrla. Funduscopic exam benign, without hemorrhages, exudates or papilledema. Vision grossly normal. Nose:  mucosal erythema and mucosal edema.   Mouth:  Oral mucosa and oropharynx without lesions or  exudates.  poor dentition Neck:  No deformities, masses, or tenderness noted. Lungs:  anterior expiratory wheezes heard bilaterally otherwise tight lungs bilat, no crackles heard Heart:  bradycardic rate, regular rhythm, no murmur, no gallop, no rub, and no JVD.   Abdomen:  Obese, Bowel sounds positive,abdomen soft and non-tender without masses, organomegaly or hernias noted. Extremities:  1+ pitt. pretibial edema bilateral, 1+ bilat. ankle edema  Neurologic:  No cranial nerve deficits noted. Station and gait are normal. Plantar reflexes are down-going bilaterally. DTRs are symmetrical throughout. Sensory, motor and coordinative functions appear intact. Skin:  Intact without suspicious lesions or rashes Cervical Nodes:  No lymphadenopathy noted Psych:  Cognition and judgment appear intact. Alert and cooperative with normal attention span and concentration. No apparent delusions, illusions, hallucinations   Problems:  Medical Problems Added: 1)  Dx of Dyspnea On Exertion  (ICD-786.09) 2)  Dx of Bradycardia  (ICD-427.89)  Impression & Recommendations:  Problem # 1:  DYSPNEA ON EXERTION (ICD-786.09) Assessment Deteriorated  His updated medication list for this problem includes:       Duoneb 0.5-2.5 (3) Mg/28ml Soln (Ipratropium-albuterol) .Marland Kitchen... To use in nebulizer two times a day as needed    Advair Diskus 250-50 Mcg/dose Aepb (Fluticasone-salmeterol) .Marland Kitchen... 1 inh two times a day    Metoprolol Succinate 25 Mg Xr24h-tab (Metoprolol succinate) .Marland Kitchen... Take one tablet by mouth daily    Ventolin Hfa 108 (90 Base) Mcg/act Aers (Albuterol sulfate) .Marland Kitchen... 2 puffs every 3 hours as needed for cough and wheezing and sob  Orders: Cardiology Referral (Cardiology) [phone]) Tobacco (smoke) use Cessation Intervention-Counseling (Q6578)  The patient's symptoms have been progressive over the past 2 weeks.  The patient declined to have a nebulizer treatment done in the office today.  I called the patient's  cardiologist and they did agree to see him today at 2 PM. I sent the patient to radiology to get a PA and lateral chest x-ray performed today as well. the patient is currently taking antibiotics for COPD exacerbation.  In addition, I counseled the patient extensively regarding the seriousness of refusing to go to the emergency department to have an evaluation done. The patient verbalized understanding. I told the patient that complications could include death and the patient verbalized understanding. His wife was also present who verbalized understanding. In addition, I told the patient that he could use his dual neb treatments every 4 hours for relief of shortness of breath. I also explained to him the importance of having a complete cardiology evaluation done with echocardiogram and stress testing. I told him that this could be related to cardiac disease and coronary artery disease. The patient verbalized understanding.  Problem # 2:  BRADYCARDIA (ICD-427.89)  His updated medication list for this problem includes:    Aspir-low 81 Mg Tbec (Aspirin) .Marland Kitchen... Take 2  tablet by mouth once a day    Amiodarone Hcl 200 Mg Tabs (Amiodarone hcl) .Marland Kitchen... Take one tab once daily.    Metoprolol Succinate 25 Mg Xr24h-tab (Metoprolol succinate) .Marland Kitchen... Take one tablet by mouth daily  Orders: Cardiology Referral (Cardiology)  Problem # 3:  UNSPECIFIED ESSENTIAL HYPERTENSION (ICD-401.9)  His updated medication list for this problem includes:  Dilt-cd 120 Mg Xr24h-cap (Diltiazem hcl coated beads) ..... One tablet once daily as needed for rapid heartrate    Metoprolol Succinate 25 Mg Xr24h-tab (Metoprolol succinate) .Marland Kitchen... Take one tablet by mouth daily  Problem # 4:  DIAB W/O MENTION COMP TYPE II/UNS TYPE UNCNTRL (ICD-250.02)  His updated medication list for this problem includes:    Aspir-low 81 Mg Tbec (Aspirin) .Marland Kitchen... Take 2  tablet by mouth once a day  Problem # 5:  COPD (ICD-496)  His updated medication list  for this problem includes:        Duoneb 0.5-2.5 (3) Mg/43ml Soln (Ipratropium-albuterol) .Marland Kitchen... To use in nebulizer two times a day as needed    Advair Diskus 250-50 Mcg/dose Aepb (Fluticasone-salmeterol) .Marland Kitchen... 1 inh two times a day    Ventolin Hfa 108 (90 Base) Mcg/act Aers (Albuterol sulfate) .Marland Kitchen... 2 puffs every 3 hours as needed for cough and wheezing and sob    The patient has an appointment with the pulmonologist on January 30. He walked out of the appointment that was previously made for him        because of arriving late and having an altercation with the staff  Orders: (U0454) Tobacco (smoke) use Cessation Intervention-Counseling (U9811)  Problem # 6:  ATRIAL FIBRILLATION (ICD-427.31) Assessment: Improved  His updated medication list for this problem includes:    Aspir-low 81 Mg Tbec (Aspirin) .Marland Kitchen... Take 2  tablet by mouth once a day    Dilt-cd 120 Mg Xr24h-cap (Diltiazem hcl coated beads) ..... One tablet once daily as needed for rapid heartrate    Amiodarone Hcl 200 Mg Tabs (Amiodarone hcl) .Marland Kitchen... Take one tab once daily.    Metoprolol Succinate 25 Mg Xr24h-tab (Metoprolol succinate) .Marland Kitchen... Take one tablet by mouth daily    Pradaxa on board for anticoagulation.   Complete Medication List: 1)  Aspir-low 81 Mg Tbec (Aspirin) .... Take 2  tablet by mouth once a day 2)  Nebulizer Misc (Nebulizers) .... To use as directed 3)  Duoneb 0.5-2.5 (3) Mg/3ml Soln (Ipratropium-albuterol) .... To use in nebulizer two times a day as needed 4)  Zegerid 40-1100 Mg Caps (Omeprazole-sodium bicarbonate) .Marland Kitchen.. 1 by mouth daily 5)  Advair Diskus 250-50 Mcg/dose Aepb (Fluticasone-salmeterol) .Marland Kitchen.. 1 inh two times a day 6)  Lipitor 40 Mg Tabs (Atorvastatin calcium) .... Take one tablet by mouth daily. 7)  Neurontin 300 Mg Caps (Gabapentin) .... Take 1 by mouth two times a day as needed nerve pain 8)  Dilt-cd 120 Mg Xr24h-cap (Diltiazem hcl coated beads) .... One tablet once daily as needed for rapid  heartrate 9)  Amiodarone Hcl 200 Mg Tabs (Amiodarone hcl) .... Take one tab once daily. 10)  Pradaxa 150 Mg Caps (Dabigatran etexilate mesylate) .... Take 1 tablet by mouth two times a day 11)  Metoprolol Succinate 25 Mg Xr24h-tab (Metoprolol succinate) .... Take one tablet by mouth daily 12)  Ventolin Hfa 108 (90 Base) Mcg/act Aers (Albuterol sulfate) .... 2 puffs every 3 hours as needed for cough and wheezing and sob 13)  Lortab 5-500 Mg Tabs (Hydrocodone-acetaminophen) .... Take 1 tab by mouth every 6 hours as needed severe pain - will cause drowsiness.  do not drive on this medication 14)  Guiatuss Ac 100-10 Mg/20ml Syrp (Guaifenesin-codeine) .... Take 1 teaspoon by mouth every 6 hours as needed severe cough and congestion: caution will cause drowsiness  Patient Instructions: 1)  Go to see your cardiologist today at 2 PM as scheduled. 2)  I strongly recommend  that you go to the emergency department if your symptoms worsen and if they do not improve in the next 24 hours. 3)  It is okay to use your dual neb home nebulizer treatments every 4 hours as needed for shortness of breath, coughing and wheezing. 4)  Tobacco is very bad for your health and your loved ones! You Should stop smoking!. 5)  Stop Smoking Tips: Choose a Quit date. Cut down before the Quit date. decide what you will do as a substitute when you feel the urge to smoke(gum,toothpick,exercise). 6)  The patient was counseled and advised to stop using all tobacco products.  Medical assistance was offered and the patient was encouraged to call 1-800-QUIT-NOW to get a smoking cessation coach.    7)  The patient was informed that there is no on-call provider or services available at this clinic during off-hours (when the clinic is closed).  If the patient developed a problem or concern that required immediate attention, the patient was advised to go the the nearest available urgent care or emergency department for medical care.  The patient  verbalized understanding.    8)  Please return in 2 days for recheck.    Orders Added: 1)  Cardiology Referral [Cardiology] 2)  (Z6109) Tobacco (smoke) use Cessation Intervention-Counseling [G8402]    I spent more than 65 minutes with the patient today and in consultation with the patient and his wife regarding the seriousness of his decline and the importance that he follow recommendations. He is absolutely refusing to go to the emergency department or be admitted into the hospital. Also, I called his cardiologist's office and arrange for him to have an appointment to be seen today. I told the patient that he would likely have further cardiac testing and procedures done and the patient said that he would cooperate.

## 2011-01-09 NOTE — Letter (Signed)
Summary: *Referral Letter  [facility]  [address]   [place], [place] [address]   Phone: [phone]  Fax: [phone]    11/29/2010  Thank you in advance for agreeing to see my patient:  [name] [address] [place], [place]  [address]  Phone: [phone]  Reason for Referral: COPD with frequent recurrent exacerbations  Current Medical Problems:   Current Medications: 1)  COMBIVENT 18-103 MCG/ACT AERO (IPRATROPIUM-ALBUTEROL) 2 puffs inhaled two times a day 2)  ASPIR-LOW 81 MG TBEC (ASPIRIN) Take 2  tablet by mouth once a day 3)  NEBULIZER  MISC (NEBULIZERS) to use as directed 4)  DUONEB 0.5-2.5 (3) MG/3ML SOLN (IPRATROPIUM-ALBUTEROL) To use in nebulizer two times a day as needed 5)  MAG-OX 400 400 MG TABS (MAGNESIUM OXIDE) sig 2 tablets by mouth 1-2 times aday as needed for cramps  reduce dosage as cramps go away 6)  ZEGERID 40-1100 MG CAPS (OMEPRAZOLE-SODIUM BICARBONATE) 1 by mouth daily 10)  ADVAIR DISKUS 500-50 MCG/DOSE AEPB (FLUTICASONE-SALMETEROL) 1 puff by mouth two times a day 11)  NITROLINGUAL 0.4 MG/SPRAY SOLN (NITROGLYCERIN) use as directed for chest pain 12)  SIMVASTATIN 20 MG TABS (SIMVASTATIN) Take one tablet by mouth daily at bedtime 13)  NEURONTIN 300 MG CAPS (GABAPENTIN) take 1 by mouth two times a day as needed nerve pain 14)  DILT-CD 120 MG XR24H-CAP (DILTIAZEM HCL COATED BEADS) one tablet once daily as needed for rapid heartrate 15)  AMIODARONE HCL 200 MG TABS (AMIODARONE HCL) Take one tab once daily. 16)  PRADAXA 150 MG CAPS (DABIGATRAN ETEXILATE MESYLATE) Take 1 tablet by mouth two times a day 17)  METOPROLOL SUCCINATE 25 MG XR24H-TAB (METOPROLOL SUCCINATE) Take one tablet by mouth daily 18)  VENTOLIN HFA 108 (90 BASE) MCG/ACT AERS (ALBUTEROL SULFATE) 2 puffs every 3 hours as needed for cough and wheezing and SOB 19)  AUGMENTIN 875-125 MG TABS (AMOXICILLIN-POT CLAVULANATE) take 1 by mouth two times a day 20)  PANTOPRAZOLE SODIUM 40 MG  TBEC (PANTOPRAZOLE SODIUM) take 1 by mouth daily   Past Medical History: 1)  COPD - Emphysema 2)  Chronic Active Nicotine Addiction 3)  GERD - severe 4)  Chronic Muscle Spasms 5)  Chronic Neuralgia Pain in Hands L>R from accident 6)  Osteoarthritis 7)  Paroxysmal Atrial Fibrillation   Prior History of Blood Transfusions:   Pertinent Labs:    Thank you again for agreeing to see our patient; please contact us if you have any further questions or need additional information.  Sincerely,  [name] MD

## 2011-01-09 NOTE — Medication Information (Signed)
Summary: FAXED SCRIPT  FAXED SCRIPT   Imported By: Rosine Beat 12/23/2010 16:48:33  _____________________________________________________________________  External Attachment:    Type:   Image     Comment:   External Document

## 2011-01-09 NOTE — Medication Information (Signed)
Summary: Refill authorization report  Refill authorization report   Imported By: Dorna Leitz 11/17/2010 15:24:13  _____________________________________________________________________  External Attachment:    Type:   Image     Comment:   External Document

## 2011-01-09 NOTE — Assessment & Plan Note (Signed)
Summary: rov   Visit Type:  Follow-up Primary Provider:  Dr. Laural Benes  CC:  c/o "SOB when walking and tying shoes." unable to speak sometimes. Denies chest pains..  History of Present Illness: 65 yo with history of COPD, obesity, exertional dyspnea, and paroxysmal atrial fibrillation presents for cardiology followup.  He is usually seen in this office by Dr. Mariah Milling.  He is in sinus rhythm today and denies tachypalpitations while on amiodarone.  He does note that his HR is in the 40s frequently (49 on ECG today).  He has some generalized fatigue.  He has had chronic exertional dyspnea that has worsened in the last few months.  He does continue to smoke 1 ppd.  He is short of breath walking around [employer] (has to stop after about 100 feet).  He is short of breath bending over.  Due to his symptoms, he has decided to retire and sold his truck.  No exertional chest pain.  Occasional very atypical chest pain.  He denies orthopnea or PND.  His wife says that he snores and occasionally gasps in his sleep.  He has daytime sleepiness.  He is off statins currently because of myalgias with Lipitor and Zocor.   ECG: sinus brady at 49 bpm, otherwise normal  Labs (12/11): LDL 148, HDL 38, LFTs normal, K 4.3, creatinine 1.01  Current Medications (verified): 1)  Aspir-Low 81 Mg Tbec (Aspirin) .... Take 2  Tablet By Mouth Once A Day 2)  Nebulizer  Misc (Nebulizers) .... To Use As Directed 3)  Duoneb 0.5-2.5 (3) Mg/64ml Soln (Ipratropium-Albuterol) .... To Use in Nebulizer Two Times A Day As Needed 4)  Zegerid 40-1100 Mg Caps (Omeprazole-Sodium Bicarbonate) .Marland Kitchen.. 1 By Mouth Daily 5)  Advair Diskus 250-50 Mcg/dose Aepb (Fluticasone-Salmeterol) .Marland Kitchen.. 1 Inh Two Times A Day 6)  Lipitor 40 Mg Tabs (Atorvastatin Calcium) .... Take One Tablet By Mouth Daily. 7)  Neurontin 300 Mg Caps (Gabapentin) .... Take 1 By Mouth Two Times A Day As Needed Nerve Pain 8)  Dilt-Cd 120 Mg Xr24h-Cap (Diltiazem Hcl Coated Beads) .... One  Tablet Once Daily As Needed For Rapid Heartrate 9)  Amiodarone Hcl 200 Mg Tabs (Amiodarone Hcl) .... Take One Tab Once Daily. 10)  Pradaxa 150 Mg Caps (Dabigatran Etexilate Mesylate) .... Take 1 Tablet By Mouth Two Times A Day 11)  Metoprolol Succinate 25 Mg Xr24h-Tab (Metoprolol Succinate) .... Take One Tablet By Mouth Daily 12)  Ventolin Hfa 108 (90 Base) Mcg/act Aers (Albuterol Sulfate) .... 2 Puffs Every 3 Hours As Needed For Cough and Wheezing and Sob 13)  Lortab 5-500 Mg Tabs (Hydrocodone-Acetaminophen) .... Take 1 Tab By Mouth Every 6 Hours As Needed Severe Pain - Will Cause Drowsiness.  Do Not Drive On This Medication 14)  Guiatuss Ac 100-10 Mg/54ml Syrp (Guaifenesin-Codeine) .... 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 Long history of medical self-neglect and then sought medical treatment when obtained Medicare insurance  Risk Factors: Smoking Status: current (Jan 24, 2011)  Packs/Day: 1.0 (12/30/2010)  Past Medical History: 1. COPD - Emphysema.  He still smokes 1 ppd.  2. Obesity 3. GERD - severe 4. 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 Osteoarthritis 7. Paroxysmal atrial fibrillation: on Pradaxa and amiodarone.  8. Hyperlipidemia: myalgias with simvastatin and atorvastatin 9. Suspect OSA  NON COMPLIANCE  And History of Confrontations with Staff  Family  History: Reviewed history from 11/28/2010 and no changes required. 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 Long history of medical self-neglect and then sought medical treatment when obtained Medicare insurance  Review of Systems       All systems reviewed and negative except as per HPI.   Vital Signs:  Patient profile:   65 year old male Height:      71 inches Weight:      280.50 pounds BMI:     39.26 Pulse rate:   56 / minute BP sitting:   102 / 68  (left arm) Cuff size:   regular  Vitals Entered By: Lysbeth Galas CMA (December 30, 2010 2:07 PM)  Physical Exam  General:  Well developed, well nourished, in no acute distress.  Obese.  Neck:  Neck supple, no JVD. No masses, thyromegaly or abnormal cervical nodes. Lungs:  Distant breath sounds with faint rhonchi bilaterally Heart:  Non-displaced PMI, chest non-tender; regular rate and rhythm, S1, S2 without murmurs, rubs or gallops. Carotid upstroke normal, no bruit.  1+ edema 1/3 up lower legs bilaterally.  Unable to feel PT pulses but feet warm.  Abdomen:  Bowel sounds positive; abdomen soft and non-tender without masses, organomegaly, or hernias noted. No hepatosplenomegaly. Extremities:  No clubbing or cyanosis. Neurologic:  Alert and oriented x 3. Psych:  Normal affect.   Impression & Recommendations:  Problem # 1:  DYSPNEA ON EXERTION (ICD-786.09) Patient has exertional dyspnea that has been worsening over the last few months.  He is now quite limited and sold his truck due to symptoms.  This may be due to COPD, and he is still smoking.  He is in sinus rhythm today.  He has numerous CAD risk factors including age/gender, hyperlipidemia, and smoking.  Symptoms could potentially be due to  ischemic diastolic dysfunction.  I will arrange for him to get an ETT-myoview (may switch to Lexiscan if he cannot exercise adequately).  I will also get an echo to assess LV systolic and diastolic function and to look for pulmonary HTN given lung disease.  He should continue ASA 81 mg daily. He is going to see a pulmonologist at [facility] on 1/30 for COPD evaluation, will get PFTs.   Problem # 2:  BRADYCARDIA (ICD-427.89) Sinus bradycardia, decrease Toprol XL to 12.5 mg daily.  This could be contributing to his fatigue.  Will also check TSH.   Problem # 3:  ATRIAL FIBRILLATION (ICD-427.31) Paroxysmal.  No tachypalpitations.  Seems to be holding NSR on amiodarone.  LFTs normal in 12/11.  Will check TSH.  Will be seeing a pulmonologist at [facility] on 1/30 and will get PFTs there.  Needs to see an eye doctor once a year with amiodarone.  Continue amiodarone and Pradaxa, decreasing Toprol XL as above.   Problem # 4:  HYPERLIPIDEMIA-MIXED (ICD-272.4) Patient has not been able to tolerate simvastatin or atorvastatin due to myalgias. I will have him try pravastatin at low dose (20 mg daily) with coenzyme q10 100 mg daily.  Lipids/LFTs in 2 months.   Problem # 5:  SMOKING I strongly encouraged the patient to quit.  He wants to try wellbutrin, which I will prescribe for him today.   Other Orders: Nuclear Stress Test (Nuc Stress Test) Echocardiogram (Echo)  Patient Instructions: 1)  Your physician recommends that you schedule a follow-up appointment in: 2 weeks with Dr. Mariah Milling. 2)  Your physician has recommended you make the following change in your medication: DECREASE Metoprolol Succinate 25mg  to 1/2 tablet once daily. START Pravastatin 20mg . CONTINUE CoQ10 100mg  once daily. START Wellbutrin 150mg  once daily x 3 days THEN take 1 tablet two times a day. 3)  Your physician has requested that you have an echocardiogram.  Echocardiography is a painless test that uses sound waves to create  images of your heart. It provides your doctor with information about the size and shape of your heart and how well your heart's chambers and valves are working.  This procedure takes approximately one hour. There are no restrictions for this procedure. 4)  Your physician has requested that you have an exercise stress myoview. Your appt is at the [facility] office 01/07/11 @ 11:45. You have been given a printout of your instructions for this procedure. For further information please visit [email or web address]  Please follow instruction sheet, as given. Prescriptions: WELLBUTRIN SR 150 MG XR12H-TAB (BUPROPION HCL) Take 1 tablet once daily x 3 days then 1 tablet two times a day.  #180 x 3   Entered by:   Lanny Hurst RN   Authorized by:   Marca Ancona, MD   Signed by:   Lanny Hurst RN on 12/30/2010   Method used:   Electronically to        [facility] [address] (retail)       [address], [address]       [place], [place]  [address]       Ph: [phone]       Fax: [phone]   RxID:   [id number] METOPROLOL SUCCINATE 25 MG XR24H-TAB (METOPROLOL SUCCINATE) Take 1/2 tablet once daily.  #90 x 3   Entered by:   Lanny Hurst RN   Authorized by:   Marca Ancona, MD   Signed by:   Lanny Hurst RN on 12/30/2010   Method used:   Electronically to        [facility] [address] (retail)       [address], [address]       [place], [place]  [address]       Ph: [phone]       Fax: [phone]   RxID:   [id number] PRAVASTATIN SODIUM 20 MG TABS (PRAVASTATIN SODIUM) Take one tablet by mouth daily at bedtime  #90 x 3   Entered by:   Lanny Hurst RN   Authorized by:   Marca Ancona, MD   Signed by:   Lanny Hurst RN on 12/30/2010   Method used:   Electronically to        [facility] [address] (retail)       [address]       [place], [place]  [address]       Ph: [phone]       Fax:  [phone]   RxID:   [id number]

## 2011-01-11 ENCOUNTER — Encounter: Payer: Self-pay | Admitting: Family Medicine

## 2011-01-13 ENCOUNTER — Ambulatory Visit (INDEPENDENT_AMBULATORY_CARE_PROVIDER_SITE_OTHER): Payer: Medicare Other | Admitting: Cardiovascular Disease

## 2011-01-13 ENCOUNTER — Encounter: Payer: Self-pay | Admitting: Cardiovascular Disease

## 2011-01-13 ENCOUNTER — Encounter: Payer: Self-pay | Admitting: Family Medicine

## 2011-01-13 DIAGNOSIS — R0602 Shortness of breath: Secondary | ICD-10-CM

## 2011-01-13 DIAGNOSIS — J449 Chronic obstructive pulmonary disease, unspecified: Secondary | ICD-10-CM

## 2011-01-13 DIAGNOSIS — E785 Hyperlipidemia, unspecified: Secondary | ICD-10-CM

## 2011-01-13 DIAGNOSIS — I4891 Unspecified atrial fibrillation: Secondary | ICD-10-CM

## 2011-01-15 NOTE — Progress Notes (Signed)
Summary: stress test questions  Phone Note Call from Patient Call back at Home Phone [phone]   Caller: Patient Call For: Nurse/Gollan Summary of Call: Pt has stress test at 11:30 and pt talked to a family that is in the medical field down at [facility]. Family member told patient that since his heart rate is in the 40s and 50s that he shouldnt do the stress test until his heart rate is up a little. Patient is a little nervous about this. Please advise. Initial call taken by: Lysbeth Galas CMA,  January 07, 2011 9:18 AM  Follow-up for Phone Call        Spoke to pt this am, he has no new symptoms, just concerned to have stress test with low HR. His HR is currently 47 (pts HR normally runs low). Notified pt that we sent the order for treadmill myoview and possible lexiscan, if unable to walk treadmill. Pt ok with this. He will go for his test today at [facility]. Follow-up by: Lanny Hurst RN,  January 07, 2011 10:22 AM

## 2011-01-15 NOTE — Assessment & Plan Note (Signed)
Summary: Cardiology Nuclear Testing  Nuclear Med Background Indications for Stress Test: Evaluation for Ischemia   History: COPD, Echo, Emphysema  History Comments: 12/31/10- Echo- EF= 55-65%. Mod. LVH H/O PAF  Symptoms: Chest Pain, DOE, Fatigue, SOB    Nuclear Pre-Procedure Cardiac Risk Factors: Family History - CAD, Lipids, Smoker Caffeine/Decaff Intake: none NPO After: 8:00 AM Lungs: minimal exp. wheezes after 2 puffs Albuterol Inhaler IV 0.9% NS with Angio Cath: 22g     IV Site: R Wrist IV Started by: Cathlyn Parsons, RN Chest Size (in) 54     Height (in): 71 Weight (lb): 285 BMI: 39.89 Tech Comments: Metoprolol held x 17hrs.  Nuclear Med Study 1 or 2 day study:  1 day     Stress Test Type:  Lexiscan Reading MD:  Marca Ancona, MD     Referring MD:  T.Gollan Resting Radionuclide:  Technetium 70m Tetrofosmin     Resting Radionuclide Dose:  11 mCi  Stress Radionuclide:  Technetium 56m Tetrofosmin     Stress Radionuclide Dose:  33 mCi   Stress Protocol  Max Systolic BP: 86 mm Hg Lexiscan: 0.4 mg   Stress Test Technologist:  Milana Na, EMT-P     Nuclear Technologist:  Doyne Keel, CNMT  Rest Procedure  Myocardial perfusion imaging was performed at rest 45 minutes following the intravenous administration of Technetium 97m Tetrofosmin.  Stress Procedure  The patient received IV Lexiscan 0.4 mg over 15-seconds.  Technetium 3m Tetrofosmin injected at 30-seconds.  There were no significant changes and sob with infusion.  Quantitative spect images were obtained after a 45 minute delay.  QPS Raw Data Images:  Normal; no motion artifact; normal heart/lung ratio. Stress Images:  Small apical perfusion defect.  Rest Images:  Small apical perfusion defect.  Subtraction (SDS):  Small fixed apical perfusion defect.  Transient Ischemic Dilatation:  1.0  (Normal <1.22)  Lung/Heart Ratio:  .40  (Normal <0.45)  Quantitative Gated Spect Images QGS EDV:  139 ml QGS ESV:   58 ml QGS EF:  58 %   Overall Impression  Exercise Capacity: Lexiscan with no exercise. BP Response: Hypotensive blood pressure response. Clinical Symptoms: Short of breath.  ECG Impression: No significant ST segment change suggestive of ischemia. Overall Impression: Small fixed apical perfusion defect with normal wall motion.  Suspect apical thinning.  No ischemia or infarction.   Appended Document: Cardiology Nuclear Testing essentially normal stress test  Appended Document: Cardiology Nuclear Testing notified patient of stress test results.

## 2011-01-15 NOTE — Progress Notes (Signed)
Summary: [facility]   Imported By: Dorna Leitz 01/11/2011 17:16:06  _____________________________________________________________________  External Attachment:    Type:   Image     Comment:   External Document

## 2011-01-15 NOTE — Progress Notes (Signed)
Summary: Nuc Pre-Procedure  Phone Note Outgoing Call Call back at [facility] Phone [phone]   Call placed by: Leonia Corona, RT-N,  January 06, 2011 3:50 PM Call placed to: Patient Reason for Call: Confirm/change Appt Summary of Call: Reviewed information on Myoview Information Sheet (see scanned document for further details).  Spoke with pt's wife.     Nuclear Med Background Indications for Stress Test: Evaluation for Ischemia   History: COPD, Echo, Emphysema  History Comments: 12/31/10- Echo- EF= 55-65%. Mod. LVH H/O PAF  Symptoms: Chest Pain, DOE, Fatigue    Nuclear Pre-Procedure Cardiac Risk Factors: Family History - CAD, Lipids, Smoker Height (in): 71

## 2011-01-16 ENCOUNTER — Telehealth (INDEPENDENT_AMBULATORY_CARE_PROVIDER_SITE_OTHER): Payer: Self-pay | Admitting: *Deleted

## 2011-01-18 ENCOUNTER — Ambulatory Visit: Payer: Medicare Other | Admitting: Family Medicine

## 2011-01-18 ENCOUNTER — Encounter: Payer: Self-pay | Admitting: Family Medicine

## 2011-01-18 DIAGNOSIS — I4891 Unspecified atrial fibrillation: Secondary | ICD-10-CM

## 2011-01-18 DIAGNOSIS — I498 Other specified cardiac arrhythmias: Secondary | ICD-10-CM

## 2011-01-18 DIAGNOSIS — J449 Chronic obstructive pulmonary disease, unspecified: Secondary | ICD-10-CM

## 2011-01-18 DIAGNOSIS — E1165 Type 2 diabetes mellitus with hyperglycemia: Secondary | ICD-10-CM

## 2011-01-18 DIAGNOSIS — I7389 Other specified peripheral vascular diseases: Secondary | ICD-10-CM

## 2011-01-18 DIAGNOSIS — R0602 Shortness of breath: Secondary | ICD-10-CM

## 2011-01-20 ENCOUNTER — Telehealth (INDEPENDENT_AMBULATORY_CARE_PROVIDER_SITE_OTHER): Payer: Self-pay | Admitting: *Deleted

## 2011-01-23 NOTE — Assessment & Plan Note (Signed)
Summary: F2W/AMD   Visit Type:  Follow-up Primary Provider:  Dr. Laural Benes  CC:  c/o SOB and legs ache all the time and feet are always cold.Marland Kitchen  History of Present Illness: 65 yo with history of COPD, obesity, exertional dyspnea, and paroxysmal atrial fibrillation presents for cardiology followup.   [name] has stopped his metoprolol and amiodarone secondary to bradycardia. He was feeling fatigued and weak. He has felt better since stopping both of these medications. He continues on the anticoagulation. He has been on Chantix and his weight has increased, though he has been smoking less, only one half pack per day.  Overall he has no complaints apart from his weakness in the legs. When he walks, he has profound weakness below the knees and has to stop and rest. He describes it as a muscle fatigue. He also has tremendous cold sensation in his feet.  Notes indicate previous myalgias on statin including Lipitor and Zocor. He does not remember the muscle ache.  Old ECG: sinus brady at 49 bpm, otherwise normal  Labs (12/11): LDL 148, HDL 38, LFTs normal, K 4.3, creatinine 1.01  Current Medications (verified): 1)  Aspir-Low 81 Mg Tbec (Aspirin) .... Take 2  Tablet By Mouth Once A Day 2)  Nebulizer  Misc (Nebulizers) .... To Use As Directed 3)  Duoneb 0.5-2.5 (3) Mg/21ml Soln (Ipratropium-Albuterol) .... To Use in Nebulizer Two Times A Day As Needed 4)  Zegerid 40-1100 Mg Caps (Omeprazole-Sodium Bicarbonate) .Marland Kitchen.. 1 By Mouth Daily 5)  Advair Diskus 250-50 Mcg/dose Aepb (Fluticasone-Salmeterol) .Marland Kitchen.. 1 Inh Two Times A Day 6)  Neurontin 300 Mg Caps (Gabapentin) .... Take 1 By Mouth Two Times A Day As Needed Nerve Pain 7)  Pradaxa 150 Mg Caps (Dabigatran Etexilate Mesylate) .... Take 1 Tablet By Mouth Two Times A Day 8)  Ventolin Hfa 108 (90 Base) Mcg/act Aers (Albuterol Sulfate) .... 2 Puffs Every 3 Hours As Needed For Cough and Wheezing and Sob 9)  Lortab 5-500 Mg Tabs (Hydrocodone-Acetaminophen)  .... Take 1 Tab By Mouth Every 6 Hours As Needed Severe Pain - Will Cause Drowsiness.  Do Not Drive On This Medication 10)  Guiatuss Ac 100-10 Mg/60ml Syrp (Guaifenesin-Codeine) .... Take 1 Teaspoon By Mouth Every 6 Hours As Needed Severe Cough and Congestion: Caution Will Cause Drowsiness 11)  Coenzyme Q10 100 Mg Caps (Coenzyme Q10) .... Take Once Daily 12)  Wellbutrin Sr 150 Mg Xr12h-Tab (Bupropion Hcl) .... Take 1 Tablet Once Daily X 3 Days Then 1 Tablet Two Times A Day. 13)  Amitriptyline Hcl 25 Mg Tabs (Amitriptyline Hcl) .... 2 Tab At [facility] To Help With Sleep and For Chronic Pain  Allergies (verified): No Known Drug Allergies  Past History:  Past Medical History: Last updated: 01/14/2011 1. COPD - Emphysema.  He still smokes 1 ppd.  2. Obesity 3. GERD - severe 4. 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 Osteoarthritis 7. Paroxysmal atrial fibrillation: on Pradaxa and amiodarone.  8. Hyperlipidemia: myalgias with simvastatin and atorvastatin 9. 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 Long history of medical self-neglect and then sought medical treatment when obtained Medicare insurance  Risk Factors: Smoking Status: current (12/30/2010) Packs/Day: 1.0 (12/30/2010)  Review of Systems       The patient complains of weight gain.  The patient denies fever, weight loss, vision loss, decreased hearing, hoarseness, chest pain, syncope, dyspnea on exertion, peripheral edema,  prolonged cough, abdominal pain, incontinence, muscle weakness, depression, and enlarged lymph nodes.         leg weakness with walking  Vital Signs:  Patient profile:   65 year old male Height:      71 inches Weight:      286.75 pounds BMI:     40.14 Pulse rate:   71 / minute BP sitting:   111 / 74  (left arm) Cuff size:   regular  Vitals Entered By: Lysbeth Galas CMA (January 13, 2011 4:08 PM)  Physical Exam  General:  Well developed, well nourished, in no acute distress.  Obese.  Head:  Normocephalic and atraumatic without obvious abnormalities. No apparent alopecia or balding. Neck:  Neck supple, no JVD. No masses, thyromegaly or abnormal cervical nodes. Lungs:  minimal exp. wheezes otherwise clear Heart:  Non-displaced PMI, chest non-tender; regular rate and rhythm, S1, S2 without murmurs, rubs or gallops. Carotid upstroke normal, no bruit.  1+ edema 1/3 up lower legs bilaterally.  Unable to feel PT pulses  Abdomen:  Bowel sounds positive; abdomen soft and non-tender without masses, obese Msk:  No deformity or scoliosis noted of thoracic or lumbar spine.   Extremities:  No clubbing or cyanosis. Neurologic:  Alert and oriented x 3. Skin:  Intact without lesions or rashes. Psych:  Normal affect.   Impression & Recommendations:  Problem # 1:  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) for his leg weakness and cold feet with poor extremity pulses, we have set him up for a lower extremity ultrasound and ABIs  Problem # 2:  SHORTNESS OF BREATH (ICD-786.05) continued mild shortness of breath is secondary to COPD, deconditioning and obesity. Negative stress test, normal echo.  The following medications were removed from the medication list:    Dilt-cd 120 Mg Xr24h-cap (Diltiazem hcl coated beads) ..... One tablet once daily as needed for rapid heartrate    Metoprolol Succinate 25 Mg Xr24h-tab (Metoprolol succinate) .Marland Kitchen... Take 1/2 tablet once daily. His updated medication list for this  problem includes:    Aspir-low 81 Mg Tbec (Aspirin) .Marland Kitchen... Take 2  tablet by mouth once a day  Problem # 3:  ATRIAL FIBRILLATION (ICD-427.31) He has had paroxysmal atrial fibrillation. He stopped all of his medications. I am concerned about continued arrhythmia given his underlying COPD. We will restart amiodarone 200 mg daily, take the metoprolol p.r.n. for tachycardia.  The following medications were removed from the medication list:    Amiodarone Hcl 200 Mg Tabs (Amiodarone hcl) .Marland Kitchen... Take one tab once daily.    Metoprolol Succinate 25 Mg Xr24h-tab (Metoprolol succinate) .Marland Kitchen... Take 1/2 tablet once daily. His updated medication list for this problem includes:    Aspir-low 81 Mg Tbec (Aspirin) .Marland Kitchen... Take 2  tablet by mouth once a day    Amiodarone Hcl 200 Mg Tabs (Amiodarone hcl) .Marland Kitchen... Take one tablet by mouth daily  Problem # 4:  HYPERLIPIDEMIA-MIXED (ICD-272.4) we will retry simvastatin 20 mg daily. If he has muscle ache, we will try Crestor 5 mg. Recheck cholesterol/LFTs in 3 months time.  The following medications were removed from the medication  list:    Pravastatin Sodium 20 Mg Tabs (Pravastatin sodium) .Marland Kitchen... Take one tablet by mouth daily at bedtime His updated medication list for this problem includes:    Simvastatin 20 Mg Tabs (Simvastatin) .Marland Kitchen... Take one tablet by mouth daily at bedtime  Problem # 5:  BRADYCARDIA (ICD-427.89) Heart rate is slow on his rhythm controlling medications. We will have to use antiarrhythmics. He stopped most of the medications himself.  The following medications were removed from the medication list:    Dilt-cd 120 Mg Xr24h-cap (Diltiazem hcl coated beads) ..... One tablet once daily as needed for rapid heartrate    Amiodarone Hcl 200 Mg Tabs (Amiodarone hcl) .Marland Kitchen... Take one tab once daily.    Metoprolol Succinate 25 Mg Xr24h-tab (Metoprolol succinate) .Marland Kitchen... Take 1/2 tablet once daily. His updated medication list for this problem includes:     Aspir-low 81 Mg Tbec (Aspirin) .Marland Kitchen... Take 2  tablet by mouth once a day    Amiodarone Hcl 200 Mg Tabs (Amiodarone hcl) .Marland Kitchen... Take one tablet by mouth daily  Problem # 6:  CIGARETTE SMOKER (ICD-305.1) Currently smoking one half pack per day, takes Chantix. We have encouraged him to continue to try to quit.  Other Orders: Arterial Duplex Lower Extremity (Arterial Duplex Low)  Patient Instructions: 1)  Your physician recommends that you schedule a follow-up appointment in: 6 months 2)  Your physician has recommended you make the following change in your medication: RESTART Amiodarone 200mg  once daily. 3)  Your physician has requested that you have a lower extremity arterial duplex.  This test is an ultrasound of the arteries in the legs or arms.  It looks at arterial blood flow in the legs and arms.  Allow one hour for Lower and Upper Arterial scans. There are no restrictions or special instructions. 4)  TYour physician recommends that you return for a FASTING lipid profile: (Lipid/LFT) Prescriptions: SIMVASTATIN 20 MG TABS (SIMVASTATIN) Take one tablet by mouth daily at bedtime  #90 x 4   Entered by:   Lanny Hurst RN   Authorized by:   Dossie Arbour MD   Signed by:   Lanny Hurst RN on 01/13/2011   Method used:   Electronically to        [facility] [address] (retail)       [address]       [place], [place]  [address]       Ph: [phone]       Fax: [phone]   RxID:   [id number]

## 2011-01-23 NOTE — Letter (Signed)
Summary: *CRML  [facility]  [address]   [place], [place] [address]   Phone: [phone]  Fax: [phone]       Patient Name: [name] DOB: 10/31/1946    January 18, 2011   To Whom It May Concern:    The above patient has been seen for primary care at this clinic for approximately 1 year (s).  This patient has been treated frequently for a multitude of medical problems contributing to his inability to perform well with any work position that required any type of strenuous activitities.  He has poor exercise tolerance and is under the care of several specialists including a cardiologist who is treating him and anticoagulating him for an arrythmia disorder.  He is also under the care of a pulmonologist who is following him for his chronic obstructive lung disease.  The patient cannot walk more than 200 feet without stopping to rest and experiencing symptoms of claudication.  He is currently being worked up for possible peripheral vascular insufficiency and claudication with an ABI evaluation performed by his cardiology care team.   He was diagnosed with COPD and is currently under treatments for that.  In addition, the patient has other co-morbid medical conditions being treated including type 2 diabetes mellitus, dyslipidemia, metabolic syndrome and persistent leukocytosis which he has been referred to a hematologist/oncologist for evaluation and management recommendations.  This letter is being written at the request of the patient.  Please contact our office with any further questions.     Sincerely,    Standley Dakins MD

## 2011-01-23 NOTE — Progress Notes (Signed)
Summary: Office Note from [employer] Note from [employer]   Imported By: Dorna Leitz 01/14/2011 13:46:29  _____________________________________________________________________  External Attachment:    Type:   Image     Comment:   External Document

## 2011-01-23 NOTE — Letter (Signed)
Summary: Office Notes from [employer] Notes from [employer]   Imported By: Dorna Leitz 01/14/2011 13:49:02  _____________________________________________________________________  External Attachment:    Type:   Image     Comment:   External Document

## 2011-01-23 NOTE — Progress Notes (Signed)
Summary: Called pt  Phone Note Outgoing Call Call back at [facility] Phone [phone] P [facility]     Call placed by: Harlon Flor,  January 16, 2011 4:22 PM Call placed to: Patient Summary of Call: LMOM TCB to schedule Arterial Lower/AMD Initial call taken by: Harlon Flor,  January 16, 2011 4:23 PM

## 2011-01-23 NOTE — Assessment & Plan Note (Signed)
Summary: SICK/EVM   Vital Signs:  Patient profile:   65 year old male Height:      71 inches Weight:      285 pounds BMI:     39.89 O2 Sat:      93 % on Room air Temp:     97.3 degrees F oral Pulse rate:   61 / minute BP sitting:   138 / 85  (left arm)  Vitals Entered By: Haze Boyden, CMA (January 18, 2011 2:44 PM)  O2 Flow:  Room air  History of Present Illness: The patient presented today requesting a letter be written saying that "he cannot work."  He says that he is having difficulty with exercise intolerance.  He is having symptoms of pain in the calves of the legs and weakness in the legs with walking 50 to 200 feet.  He says that he stopped taking all the beta blockers and is breathing better.  He is taking chantix but still not stopped smoking.  He says that he is having severe dry mouth and is not able to take the amytriptyline any longer because of that.  He says that he would like to resume taking soma.  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  He says that his pulmonologist and cardiologist have agreed that he should not be working any longer and that he is too incapacitated. He says that he is continuing to gain weight.  He had a normal stress test and echocardiogram.  He is going to be scheduled to have ABIs and be tested for claudication.  He is going to have that done through his cardiologist.    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 Long history of medical self-neglect and then sought medical  treatment when obtained Medicare insurance  Risk Factors: Smoking Status: current (2011-01-22) Packs/Day: 1.0 (Jan 22, 2011)  Past Medical History: Reviewed history from 01-22-2011 and no changes required. 1. COPD - Emphysema.  He still smokes 1 ppd.  2. Obesity 3. GERD - severe 4. 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 Osteoarthritis 7. Paroxysmal atrial fibrillation: on Pradaxa and amiodarone.  8. Hyperlipidemia: myalgias with simvastatin and atorvastatin 9. Suspect OSA  NON COMPLIANCE  And History of Confrontations with Staff  Past Surgical History: Reviewed history from 03/21/2010 and no changes required. Knee surgery Cholecystectomy Tonsillectomy  Family History: Reviewed history from 22-Jan-2011 and no changes required. Father died of leukemia had heart disease and lung probs but never smoked or drank sister died of melanoma  2 brothers of bad health  Social History: Reviewed history from 01/22/2011 and no changes required. 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 Long history of medical self-neglect and then sought medical treatment when obtained Medicare insurance  Review of Systems General:  Complains of fatigue, sleep disorder, and weakness; denies chills, fever, loss of appetite, malaise, sweats, and weight loss. Eyes:  Denies blurring, discharge,  double vision, eye irritation, eye pain, halos, itching, light sensitivity, red eye, vision loss-1 eye, and vision loss-both eyes. ENT:  Denies decreased hearing, difficulty swallowing, ear discharge, earache, hoarseness, nasal congestion, nosebleeds, postnasal drainage, ringing in ears, sinus pressure, and sore throat. CV:  Complains of difficulty breathing while lying down, fatigue, leg cramps with exertion, and shortness of breath with exertion; denies bluish discoloration of lips or nails, chest  pain or discomfort, difficulty breathing at night, fainting, lightheadness, near fainting, palpitations, swelling of feet, swelling of hands, and weight gain. Resp:  Complains of cough and wheezing; denies chest discomfort, chest pain with inspiration, coughing up blood, excessive snoring, hypersomnolence, morning headaches, pleuritic, shortness of breath, and sputum productive. GI:  Denies abdominal pain, bloody stools, change in bowel habits, constipation, dark tarry stools, diarrhea, excessive appetite, gas, hemorrhoids, indigestion, loss of appetite, nausea, vomiting, vomiting blood, and yellowish skin color. GU:  Denies decreased libido, discharge, dysuria, erectile dysfunction, genital sores, hematuria, incontinence, nocturia, urinary frequency, and urinary hesitancy. MS:  Complains of loss of strength, cramps, and muscle weakness; denies joint pain, joint redness, joint swelling, low back pain, mid back pain, muscle aches, muscle , stiffness, and thoracic pain. Derm:  Complains of changes in nail beds; denies changes in color of skin, dryness, excessive perspiration, flushing, hair loss, insect bite(s), itching, lesion(s), poor wound healing, and rash. Neuro:  Complains of weakness; denies brief paralysis, difficulty with concentration, disturbances in coordination, falling down, headaches, inability to speak, memory loss, numbness, poor balance, seizures, sensation of room spinning, tingling, tremors, and visual disturbances. Psych:  Denies alternate hallucination ( auditory/visual), anxiety, depression, easily angered, easily tearful, irritability, mental problems, panic attacks, sense of great danger, suicidal thoughts/plans, thoughts of violence, unusual visions or sounds, and thoughts /plans of harming others. Endo:  Denies cold intolerance, excessive hunger, excessive thirst, excessive urination, heat intolerance, polyuria, and weight change. Heme:  Complains of abnormal bruising and skin  discoloration; denies bleeding, enlarge lymph nodes, fevers, and pallor. Allergy:  Denies hives or rash, itching eyes, persistent infections, seasonal allergies, and sneezing.  Physical Exam  General:  Well-developed,well-nourished,in no acute distress; alert,appropriate and cooperative throughout examination Head:  Normocephalic and atraumatic without obvious abnormalities. No apparent alopecia or balding. Eyes:  No corneal or conjunctival inflammation noted. EOMI. Perrla. Funduscopic exam benign, without hemorrhages, exudates or papilledema. Vision grossly normal. Ears:  External ear exam shows no significant lesions or deformities.  Otoscopic examination reveals clear canals, tympanic membranes are intact bilaterally without bulging, retraction, inflammation or discharge. Hearing is grossly normal bilaterally. Nose:  mucosal erythema and mucosal edema.   Mouth:  Oral mucosa and oropharynx without lesions or exudates.  poor dentition Neck:  No deformities, masses, or tenderness noted. Lungs:  rare anterior expiratory wheezes heard bilaterally otherwise tight lungs bilat, no crackles heard Heart:  bradycardic rate, regular rhythm, no murmur, no gallop, no rub, and no JVD.   Abdomen:  Obese, Bowel sounds positive,abdomen soft and non-tender without masses, organomegaly or hernias noted. Pulses:  R and L carotid,radial,femoral,dorsalis pedis and posterior tibial pulses are full and equal bilaterally Extremities:  1+ pitt. pretibial edema bilateral, 1+ bilat. ankle edema, feet cyanotic at the toes; DP pulses 2+ bilat. Neurologic:  No cranial nerve deficits noted. Station and gait are normal. Plantar reflexes are down-going bilaterally. DTRs are symmetrical throughout. Sensory, motor and coordinative functions appear intact. Skin:  Intact without suspicious lesions or rashes Cervical Nodes:  No lymphadenopathy noted Psych:  Cognition and judgment appear intact. Alert  and cooperative with normal  attention span and concentration. 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    Problem # 2:  SHORTNESS OF BREATH (ICD-786.05) The patient is avoiding beta blockers.  Counseled again on stopping smoking.  The chantix seems to be helping.  The patient verbalized clear understanding.  Continue to follow up with pulmonologist.    Problem # 3:  COPD (ICD-496)  His updated medication list for this problem includes:    Duoneb 0.5-2.5 (3) Mg/49ml Soln (Ipratropium-albuterol) .Marland Kitchen... To use in nebulizer two times a day as needed    Advair Diskus 250-50 Mcg/dose Aepb (Fluticasone-salmeterol) .Marland Kitchen... 1 inh two times a day    Ventolin Hfa 108 (90 Base) Mcg/act Aers (Albuterol sulfate) .Marland Kitchen... 2 puffs every 3 hours as needed for cough and wheezing and sob  Problem # 4:  BRADYCARDIA (ICD-427.89)  His updated medication list for this problem includes:    Aspir-low 81 Mg Tbec (Aspirin) .Marland Kitchen... Take 2  tablet by mouth once a day    Amiodarone Hcl 200 Mg Tabs (Amiodarone hcl) .Marland Kitchen... Take one tablet by mouth daily  Problem # 5:  DIAB W/O MENTION COMP TYPE II/UNS TYPE UNCNTRL (ICD-250.02)  His updated medication list for this problem includes:    Aspir-low 81 Mg Tbec (Aspirin) .Marland Kitchen... Take 2  tablet by mouth once a day    Advised the patient to continue using the metformin.  He was not sure if he was still taking it at this time.  I asked the patient to continue to monitor his blood glucose as well and I will send glucose testing strips.     Problem # 6:  ATRIAL FIBRILLATION  (ICD-427.31)  His updated medication list for this problem includes:    Aspir-low 81 Mg Tbec (Aspirin) .Marland Kitchen... Take 2  tablet by mouth once a day    Amiodarone Hcl 200 Mg Tabs (Amiodarone hcl) .Marland Kitchen... Take one tablet by mouth daily  Problem # 7:  Dry mouth  Pt says that he wants to stop using the amytriptyline because of the dry mouth.  He will stop this and will use Soma as needed as needed for muscle spasms and pain.   Complete Medication List: 1)  Aspir-low 81 Mg Tbec (Aspirin) .... Take 2  tablet by mouth once a day 2)  Nebulizer Misc (Nebulizers) .... To use as directed 3)  Duoneb 0.5-2.5 (3) Mg/16ml Soln (Ipratropium-albuterol) .... To use in nebulizer two times a day as needed 4)  Zegerid 40-1100 Mg Caps (Omeprazole-sodium bicarbonate) .Marland Kitchen.. 1 by mouth daily 5)  Advair Diskus 250-50 Mcg/dose Aepb (Fluticasone-salmeterol) .Marland Kitchen.. 1 inh two times a day 6)  Neurontin 300 Mg Caps (Gabapentin) .... Take 1 by mouth two times a day as needed nerve pain 7)  Pradaxa 150 Mg Caps (Dabigatran etexilate mesylate) .... Take 1 tablet by mouth two times  a day 8)  Ventolin Hfa 108 (90 Base) Mcg/act Aers (Albuterol sulfate) .... 2 puffs every 3 hours as needed for cough and wheezing and sob 9)  Lortab 5-500 Mg Tabs (Hydrocodone-acetaminophen) .... Take 1 tab by mouth every 6 hours as needed severe pain - will cause drowsiness.  do not drive on this medication 10)  Guiatuss Ac 100-10 Mg/29ml Syrp (Guaifenesin-codeine) .... Take 1 teaspoon by mouth every 6 hours as needed severe cough and congestion: caution will cause drowsiness 11)  Coenzyme Q10 100 Mg Caps (Coenzyme q10) .... Take once daily 12)  Wellbutrin Sr 150 Mg Xr12h-tab (Bupropion hcl) .... Take 1 tablet once daily x 3 days then 1 tablet two times a day. 13)  Fish Oil 1000 Mg Caps (Omega-3 fatty acids) .... 2 tablets daily 14)  Amiodarone Hcl 200 Mg Tabs (Amiodarone hcl) .... Take one tablet by mouth daily 15)  Simvastatin 20 Mg Tabs (Simvastatin) ....  Take one tablet by mouth daily at bedtime 16)  Onetouch Ultra Blue Strp (Glucose blood) .... Test blood glucose 1 time per day and as needed as directed 17)  Carisoprodol 250 Mg Tabs (Carisoprodol) .... Take 1 by mouth at hs as needed musculoskeletal pain and discomfort.  caution will cause drowsiness  Patient Instructions: 1)  Stop taking amytriptyline! 2)  Tobacco is very bad for your health and your loved ones! You Should stop smoking!. 3)  Stop Smoking Tips: Choose a Quit date. Cut down before the Quit date. decide what you will do as a substitute when you feel the urge to smoke(gum,toothpick,exercise). 4)  It is important that you exercise regularly at least 20 minutes 5 times a week. If you develop chest pain, have severe difficulty breathing, or feel very tired , stop exercising immediately and seek medical attention. 5)  You need to lose weight. Consider a lower calorie diet and regular exercise.  6)  Check your blood sugars regularly. If your readings are usually above : 250 or below 70 you should contact our office. 7)  It is important that your Diabetic A1c level is checked every 3 months. 8)  See your eye doctor yearly to check for diabetic eye damage. 9)  Check your feet each night for sore areas, calluses or signs of infection. 10)  Check your Blood Pressure regularly. If it is above: 140/90 you should make an appointment. 11)  The patient was informed that there is no on-call provider or services available at this clinic during off-hours (when the clinic is closed).  If the patient developed a problem or concern that required immediate attention, the patient was advised to go the the nearest available urgent care or emergency department for medical care.  The patient verbalized understanding.    Prescriptions: CARISOPRODOL 250 MG TABS (CARISOPRODOL) take 1 by mouth at HS as needed musculoskeletal pain and discomfort.  Caution will cause drowsiness  #30 x 1   Entered and Authorized  by:   Standley Dakins MD   Signed by:   Standley Dakins MD on 01/18/2011   Method used:   Electronically to        [facility] [address] (retail)       [address]       [place], [place]  [address]       Ph: [phone]       Fax: [phone]   RxID:   [id number] ONETOUCH ULTRA BLUE  STRP (GLUCOSE BLOOD)  test blood glucose 1 time per day and as needed as directed  #50 x 12   Entered and Authorized by:   Standley Dakins MD   Signed by:   Standley Dakins MD on 01/18/2011   Method used:   Electronically to        [facility] [address] (retail)       [address]       [place], [place]  [address]       Ph: [phone]       Fax: [phone]   RxID:   [id number]

## 2011-01-28 ENCOUNTER — Telehealth: Payer: Self-pay | Admitting: Family Medicine

## 2011-01-29 NOTE — Progress Notes (Signed)
  Phone Note Call from Patient   Summary of Call: Mr Tyer's wife called, Syliva Overman called and asked Dr. Laural Benes to refill his Nitroglycerin spray.  Dr. Laural Benes recommended that Ms. Leath needs to call his cardiologist to see if his Nitroglycerin spray is needed. Initial call taken by: Rosine Beat,  January 20, 2011 1:30 PM

## 2011-02-04 ENCOUNTER — Telehealth: Payer: Self-pay | Admitting: Family Medicine

## 2011-02-06 ENCOUNTER — Telehealth: Payer: Self-pay | Admitting: Family Medicine

## 2011-02-08 ENCOUNTER — Ambulatory Visit: Payer: Medicare Other | Admitting: Family Medicine

## 2011-02-08 ENCOUNTER — Encounter: Payer: Self-pay | Admitting: Family Medicine

## 2011-02-08 DIAGNOSIS — F17200 Nicotine dependence, unspecified, uncomplicated: Secondary | ICD-10-CM

## 2011-02-08 DIAGNOSIS — J019 Acute sinusitis, unspecified: Secondary | ICD-10-CM

## 2011-02-08 DIAGNOSIS — J209 Acute bronchitis, unspecified: Secondary | ICD-10-CM

## 2011-02-13 ENCOUNTER — Other Ambulatory Visit: Payer: Self-pay

## 2011-02-13 NOTE — Assessment & Plan Note (Signed)
Summary: cough   Vital Signs:  Patient Profile:   65 Years Old Male CC:      followup visit// Patient states he has quit taking a lot of his medications// stopped heart rhythmn medications Height:     71 inches Weight:      292 pounds O2 Sat:      91 % O2 treatment:    Room Air Temp:     97.5 degrees F oral Pulse rate:   60 / minute Pulse rhythm:   regular Resp:     24 per minute BP sitting:   118 / 70  (right arm) Cuff size:   large  Pt. in pain?   no  Vitals Entered By: Providence Crosby LPN (February 07, 4781 12:09 PM)                   Current Allergies (reviewed today): ! * CHANTIXHistory of Present Illness Chief Complaint: followup visit// Patient states he has quit taking a lot of his medications// stopped heart rhythmn medications History of Present Illness: short of breath today multiple complaints; complains of fatique;legs swelling;tires easily ; states it happens all the time; sleep problems, His main concern for coming here today is that he is coughing up green yellow colored sputum.  He stopped taking Chantix and continues to smoke.  He says that he stopped a lot of his medications.  He stopped the metformin.  He says that his BS is 100-115.  He reports chronic SOB but says that he is using his nebs at home and tolerating them 3 to 4 times per day. He did not have the sleep study done or the vascular study done.  He admits that he has been noncompliant.  He says that he is going to see his pulmonologist for blood work next week.   He did not want to get blood work done here.  He did say that he got his albuterol inhaler.     REVIEW OF SYSTEMS Constitutional Symptoms      Denies fever, chills, night sweats, weight loss, weight gain, and fatigue.  Eyes       Denies change in vision, eye pain, eye discharge, glasses, contact lenses, and eye surgery. Ear/Nose/Throat/Mouth       Denies hearing loss/aids, change in hearing, ear pain, ear discharge, dizziness, frequent runny  nose, frequent nose bleeds, sinus problems, sore throat, hoarseness, and tooth pain or bleeding.  Respiratory       Complains of dry cough, wheezing, shortness of breath, asthma, bronchitis, and emphysema/COPD.      Denies productive cough.  Cardiovascular       Complains of tires easily with exhertion.      Denies murmurs and chest pain.    Gastrointestinal       Denies stomach pain, nausea/vomiting, diarrhea, constipation, blood in bowel movements, and indigestion. Genitourniary       Denies painful urination, kidney stones, and loss of urinary control. Neurological       Complains of loss of or changes in sensation and tingling.      Denies paralysis, seizures, and fainting/blackouts. Musculoskeletal       Complains of muscle pain, joint pain, joint stiffness, decreased range of motion, redness, swelling, and muscle weakness.      Denies gout.  Skin       Complains of bruising.      Denies unusual mles/lumps or sores and hair/skin or nail changes.  Psych  Complains of mood changes, temper/anger issues, anxiety/stress, speech problems, depression, and sleep problems.  Past History:  Past Medical History: Last updated: 01-06-11 1. COPD - Emphysema.  He still smokes 1 ppd.  2. Obesity 3. GERD - severe 4. 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 Osteoarthritis 7. Paroxysmal atrial fibrillation: on Pradaxa and amiodarone.  8. Hyperlipidemia: myalgias with simvastatin and atorvastatin 9. 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 Long history of medical self-neglect and then sought medical treatment when obtained Medicare insurance  Risk Factors: Smoking Status: current (02/08/2011) Packs/Day: 1.0 (02/08/2011)  Family History: Reviewed history from Jan 06, 2011 and no changes required. Father died of leukemia had heart disease and lung probs but never smoked or drank sister died of melanoma  2 brothers of bad health  Social History: Reviewed history from 06-Jan-2011 and no changes required. 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 Long history of medical self-neglect and then sought medical treatment when obtained Medicare insurance Physical Exam General appearance: well developed, well nourished, no acute distress Head: normocephalic, atraumatic Eyes: conjunctivae and lids normal Pupils: equal, round, reactive to light Ears: normal, no lesions or deformities Nasal: mucosa pink, nonedematous, no septal deviation, turbinates normal Oral/Pharynx: tongue normal, posterior pharynx without erythema or exudate Neck: neck supple,  trachea midline, no masses Chest/Lungs: tight lungs bilateral, expiratory wheezes  Heart: regular rate and  rhythm, no murmur Abdomen: soft, non-tender without obvious organomegaly Extremities: dorsalis pedis pulses 1+ bilateral; no calf tenderness; 1+ pretibial edema bilateral lower extremities;  Neurological: grossly intact and non-focal Skin: no obvious rashes or lesions MSE: oriented to time, place, and person Assessment  Assessed SHORTNESS OF BREATH as unchanged - Jim Hollenkamp MD Assessed DYSPNEA ON EXERTION as unchanged - Scotland Korver MD Assessed BRADYCARDIA as unchanged - Aivan Fillingim MD Assessed DIAB W/O MENTION COMP TYPE II/UNS TYPE UNCNTRL as unchanged - Quentina Fronek MD Assessed COPD as deteriorated - Monic Engelmann MD Assessed CIGARETTE SMOKER as unchanged - Hadrian Yarbrough MD Assessed PERS HX NONCOMPLIANCE W/MED TX PRS HAZARDS HLTH as unchanged - Renelda Kilian MD New Problems: ACUTE SINUSITIS, UNSPECIFIED (ICD-461.9) ACUTE BRONCHITIS (ICD-466.0)   Patient Education: The risks, benefits and possible side effects were clearly explained and discussed with the patient.  The patient verbalized clear understanding.  The patient was given instructions to return if symptoms don't improve, worsen or new changes develop.  If it is not during clinic hours and the patient cannot get back to this clinic then the patient was told to seek medical care at an available urgent care or emergency department.  The patient verbalized understanding.   Demonstrates willingness to comply.  Plan New Medications/Changes: DOXYCYCLINE HYCLATE 100 MG TABS (DOXYCYCLINE HYCLATE) take 1 by mouth two times a day with food until completed  #28 x 0, 02/08/2011, Standley Dakins MD  Follow Up: Follow up in 2-3 days if no improvement Follow Up: 2 weeks for blood work  The patient and/or caregiver has been counseled thoroughly with regard to medications prescribed including dosage, schedule, interactions, rationale for use, and possible side effects and they verbalize understanding.  Diagnoses and expected course of recovery discussed and will return if not improved as expected or if the condition worsens. Patient and/or caregiver verbalized understanding.  Prescriptions: DOXYCYCLINE HYCLATE 100 MG TABS (DOXYCYCLINE HYCLATE) take 1 by mouth two times a day with food until completed  #28 x 0   Entered and Authorized by:   Standley Dakins MD   Signed by:   Standley Dakins MD on 02/08/2011   Method used:   Handwritten   RxID:   [id number]   Patient Instructions: 1)  Discussed with the patient verbally.  The patient verbalized clear understanding.   2)  Go to the pharmacy and pick up your prescription (s).  It may take  up to 30 mins for electronic prescriptions to be delivered to the pharmacy.  Please call if your pharmacy has not received your prescriptions after 30 minutes.   3)  The patient was counseled and advised to stop using all tobacco products.  Medical assistance was offered and the patient was encouraged to call 1-800-QUIT-NOW to get a smoking cessation coach.    4)  Tobacco is very bad for your health and your loved ones! You Should stop smoking!. 5)  Stop Smoking Tips: Choose a Quit date. Cut down before the Quit date. decide what you will do as a substitute when you feel the urge to smoke(gum,toothpick,exercise). 6)  It is important that you exercise regularly at least 20 minutes 5 times a week. If you develop chest pain, have severe difficulty breathing, or feel very tired , stop exercising immediately and seek medical attention. 7)  You need to lose weight. Consider a lower calorie diet and regular exercise.  8)  Check your blood sugars regularly. If your readings are usually above :200 or below 70 you should contact our office. 9)  It is important that your Diabetic A1c level is checked every 3 months. 10)  See your eye doctor yearly to check for diabetic eye damage. 11)  Check your feet each night for sore areas, calluses or signs of infection. 12)  Check your Blood Pressure regularly. If it is above:140/90 you should make an appointment.    Preventive Screening-Counseling & Management  Alcohol-Tobacco     Smoking Status: current     Smoking Cessation Counseling: yes     Smoke Cessation Stage: precontemplative     Packs/Day: 1.0     Tobacco Counseling: to quit use of tobacco products

## 2011-02-13 NOTE — Progress Notes (Signed)
  Phone Note Call from Patient   Caller: Patient Reason for Call: Refill Medication Summary of Call: Patient came by the clinic while he was shopping in the store.  He would like Dr. Laural Benes to refill antibiotics for him.  He would also like a prescripton for a handheld inhaler.  You can send the prescription to the pharmacy on [address] in [place]. Initial call taken by: Dorna Leitz,  January 28, 2011 4:00 PM  Follow-up for Phone Call        Ask the patient what inhaler he is referring to?  Is it for Advair or is it for Ventolin (which he should already have plenty of refills for).  Follow-up by: Standley Dakins MD,  January 28, 2011 4:27 PM  Additional Follow-up for Phone Call Additional follow up Details #1::        Pt needs albuterol inhaler refilled.     Additional Follow-up for Phone Call Additional follow up Details #2::    Rx has been sent to pharmacy - [facility] [address] [place] Follow-up by: Standley Dakins MD

## 2011-02-13 NOTE — Progress Notes (Signed)
  Phone Note Other Incoming Call back at Pt wife    Caller: Pt's wife called Summary of Call: She says that insurance company would not pay for ventolin HFA but would probably pay for an alternative.   We told her that we never received a fax from [employer] like she had said we would receive but we would be happy to send RX for ProAir to the pharmacy if they would pay for that.      New/Updated Medications: PROAIR HFA 108 (90 BASE) MCG/ACT AERS (ALBUTEROL SULFATE) 2 puffs inhaled every 4 hours as needed wheezing, SOB Prescriptions: PROAIR HFA 108 (90 BASE) MCG/ACT AERS (ALBUTEROL SULFATE) 2 puffs inhaled every 4 hours as needed wheezing, SOB  #1 x 3   Entered and Authorized by:   Standley Dakins MD   Signed by:   Standley Dakins MD on 02/06/2011   Method used:   Electronically to        [facility] [address] (retail)       [address]       [place], [place]  [address]       Ph: [phone]       Fax: [phone]   RxID:   [id number]

## 2011-02-13 NOTE — Progress Notes (Signed)
Summary: medication form   Phone Note Call from Patient   Summary of Call: patient wife called stated that patient need approval for medication ventolin through [employer] patient wife states that [employer] supposed to faxed a form for Dr. Laural Benes to fill out.. patient states that need the form filled out because this is the only medication that patient can take without him having side effects.. patient wife states that the form should be filled out stating that this is the only medication that [name] is able to take. Advise patient wife that we were not able to call [employer] but she could call and give them our fax number to fax the form over  Initial call taken by: Eugenio Hoes,  February 04, 2011 1:55 PM  Follow-up for Phone Call        I will not be untruthful with insurance companies about medications.  He has never said anything to me about not being able to tolerate ventolin for any of the many office visits that he has had out here.  He will need to have them to fax [place] any authorization forms that need completion but I can only put on them what I know to be true and from his office visits and other interactions that we have had with him.  He may want to speak with his lung specialist if this is really a problem for him.   Follow-up by: Standley Dakins MD,  February 04, 2011 2:24 PM

## 2011-02-14 ENCOUNTER — Telehealth: Payer: Self-pay | Admitting: Family Medicine

## 2011-02-15 ENCOUNTER — Encounter: Payer: Self-pay | Admitting: Cardiovascular Disease

## 2011-02-17 ENCOUNTER — Telehealth: Payer: Self-pay | Admitting: Family Medicine

## 2011-02-17 ENCOUNTER — Encounter: Payer: Self-pay | Admitting: Cardiovascular Disease

## 2011-02-17 ENCOUNTER — Other Ambulatory Visit (INDEPENDENT_AMBULATORY_CARE_PROVIDER_SITE_OTHER): Payer: Medicare Other

## 2011-02-17 DIAGNOSIS — E785 Hyperlipidemia, unspecified: Secondary | ICD-10-CM

## 2011-02-18 NOTE — Progress Notes (Signed)
  Phone Note Call from Patient   Summary of Call: PATIENT CAME BYE STATED THAT HE NEED A REFILL ON LORTAB 10-500 MG/ SOMA 350 MG... ALSO PATIENT NEED ADVAIR 500-50 MCG PLEASE CLL INTO [facility] IN [place] PATIENT CAN BE CONTACTED @  [phone] Initial call taken by: Eugenio Hoes,  February 14, 2011 1:03 PM    New/Updated Medications: ADVAIR DISKUS 500-50 MCG/DOSE AEPB (FLUTICASONE-SALMETEROL) 1 inh two times a day Prescriptions: ADVAIR DISKUS 500-50 MCG/DOSE AEPB (FLUTICASONE-SALMETEROL) 1 inh two times a day  #1 x 1   Entered and Authorized by:   Standley Dakins MD   Signed by:   Standley Dakins MD on 02/14/2011   Method used:   Electronically to        [facility] [address] (retail)       [address]       [place], [place]  [address]       Ph: [phone]       Fax: [phone]   RxID:   [id number] LORTAB 5-500 MG TABS (HYDROCODONE-ACETAMINOPHEN) take 1 tab by mouth every 6 hours as needed severe pain - WILL CAUSE DROWSINESS.  DO NOT DRIVE ON THIS MEDICATION  #20 x 0   Entered and Authorized by:   Standley Dakins MD   Signed by:   Standley Dakins MD on 02/14/2011   Method used:   Printed then faxed to ...       [facility] [address] (retail)       [address]       [place], [place]  [address]       Ph: [phone]       Fax: [phone]   RxID:   [id number] CARISOPRODOL 250 MG TABS (CARISOPRODOL) take 1 by mouth at [facility] as needed musculoskeletal pain and discomfort.  Caution will cause drowsiness  #30 x 1   Entered and Authorized by:   Standley Dakins MD   Signed by:   Standley Dakins MD on 02/14/2011   Method used:   Printed then faxed to ...       [facility] [address] (retail)       [address]       [place], [place]  [address]       Ph: [phone]       Fax: [phone]   RxID:   [id number] ADVAIR DISKUS 500-50 MCG/DOSE  AEPB (FLUTICASONE-SALMETEROL) 1 inh two times a day  #1 x 1   Entered and Authorized by:   Standley Dakins MD   Signed by:   Standley Dakins MD on 02/14/2011   Method used:   Printed then faxed to ...       [facility] [address] (retail)       [address]       [place], [place]  [address]       Ph: [phone]       Fax: [phone]   RxID:   [id number]

## 2011-02-19 LAB — CONVERTED CEMR LAB
ALT: 22 units/L (ref 0–53)
Albumin: 3.9 g/dL (ref 3.5–5.2)
Cholesterol: 188 mg/dL (ref 0–200)
HDL: 42 mg/dL (ref >39)
LDL Cholesterol: 125 mg/dL — ABNORMAL HIGH (ref 0–99)
Total Protein: 6 g/dL (ref 6.0–8.3)
Triglycerides: 106 mg/dL (ref <150)
VLDL: 21 mg/dL (ref 0–40)

## 2011-02-25 ENCOUNTER — Encounter (INDEPENDENT_AMBULATORY_CARE_PROVIDER_SITE_OTHER): Payer: Medicare Other | Admitting: *Deleted

## 2011-02-25 ENCOUNTER — Other Ambulatory Visit: Payer: Self-pay | Admitting: Cardiovascular Disease

## 2011-02-25 DIAGNOSIS — I739 Peripheral vascular disease, unspecified: Secondary | ICD-10-CM

## 2011-02-25 DIAGNOSIS — M79609 Pain in unspecified limb: Secondary | ICD-10-CM

## 2011-02-25 NOTE — Progress Notes (Signed)
  Phone Note Call from Patient   Summary of Call: Patient wife called stated that patient need refill on Gabapentine 300 mg [facility] [address] Initial call taken by: Eugenio Hoes,  February 17, 2011 11:27 AM    Prescriptions: NEURONTIN 300 MG CAPS (GABAPENTIN) take 1 by mouth two times a day as needed nerve pain  #60 x 2   Entered and Authorized by:   Standley Dakins MD   Signed by:   Standley Dakins MD on 02/17/2011   Method used:   Re-Faxed to ...       [facility] [address] (retail)       [address]       [place], [place]  [address]       Ph: [phone]       Fax: [phone]   RxID:   [id number] NEURONTIN 300 MG CAPS (GABAPENTIN) take 1 by mouth two times a day as needed nerve pain  #60 x 2   Entered and Authorized by:   Standley Dakins MD   Signed by:   Standley Dakins MD on 02/17/2011   Method used:   Electronically to        [facility] [address] (retail)       [address]       [place], [place]  [address]       Ph: [phone]       Fax: [phone]   RxID:   [id number]

## 2011-02-27 ENCOUNTER — Ambulatory Visit: Payer: Medicare Other | Admitting: Family Medicine

## 2011-02-27 ENCOUNTER — Encounter: Payer: Self-pay | Admitting: Family Medicine

## 2011-02-27 DIAGNOSIS — Z9119 Patient's noncompliance with other medical treatment and regimen: Secondary | ICD-10-CM

## 2011-02-27 DIAGNOSIS — R0609 Other forms of dyspnea: Secondary | ICD-10-CM

## 2011-02-27 DIAGNOSIS — M79609 Pain in unspecified limb: Secondary | ICD-10-CM

## 2011-02-27 DIAGNOSIS — F17200 Nicotine dependence, unspecified, uncomplicated: Secondary | ICD-10-CM

## 2011-02-27 DIAGNOSIS — E1149 Type 2 diabetes mellitus with other diabetic neurological complication: Secondary | ICD-10-CM

## 2011-02-27 DIAGNOSIS — R0989 Other specified symptoms and signs involving the circulatory and respiratory systems: Secondary | ICD-10-CM

## 2011-02-27 DIAGNOSIS — J449 Chronic obstructive pulmonary disease, unspecified: Secondary | ICD-10-CM

## 2011-02-27 DIAGNOSIS — E1165 Type 2 diabetes mellitus with hyperglycemia: Secondary | ICD-10-CM

## 2011-02-27 DIAGNOSIS — E1142 Type 2 diabetes mellitus with diabetic polyneuropathy: Secondary | ICD-10-CM | POA: Insufficient documentation

## 2011-02-27 DIAGNOSIS — E1140 Type 2 diabetes mellitus with diabetic neuropathy, unspecified: Secondary | ICD-10-CM | POA: Insufficient documentation

## 2011-02-28 ENCOUNTER — Institutional Professional Consult (permissible substitution): Payer: Medicare Other | Admitting: Family Medicine

## 2011-02-28 ENCOUNTER — Encounter: Payer: Self-pay | Admitting: Family Medicine

## 2011-02-28 DIAGNOSIS — M79609 Pain in unspecified limb: Secondary | ICD-10-CM

## 2011-02-28 DIAGNOSIS — M60009 Infective myositis, unspecified site: Secondary | ICD-10-CM

## 2011-02-28 DIAGNOSIS — E1142 Type 2 diabetes mellitus with diabetic polyneuropathy: Secondary | ICD-10-CM

## 2011-02-28 DIAGNOSIS — R252 Cramp and spasm: Secondary | ICD-10-CM

## 2011-03-03 ENCOUNTER — Encounter: Payer: Self-pay | Admitting: Family Medicine

## 2011-03-04 ENCOUNTER — Encounter: Payer: Self-pay | Admitting: Internal Medicine

## 2011-03-04 ENCOUNTER — Ambulatory Visit: Payer: Medicare Other | Admitting: Internal Medicine

## 2011-03-04 ENCOUNTER — Encounter: Payer: Self-pay | Admitting: Family Medicine

## 2011-03-04 DIAGNOSIS — IMO0002 Reserved for concepts with insufficient information to code with codable children: Secondary | ICD-10-CM
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
## 2011-03-11 NOTE — Letter (Signed)
Summary: History Form  History Form   Imported By: Eugenio Hoes 03/05/2011 09:55:17  _____________________________________________________________________  External Attachment:    Type:   Image     Comment:   External Document

## 2011-03-11 NOTE — Letter (Signed)
Summary: [employer]  [employer]   Imported By: Eugenio Hoes 03/04/2011 10:25:13  _____________________________________________________________________  External Attachment:    Type:   Image     Comment:   External Document

## 2011-03-11 NOTE — Letter (Signed)
Summary: Power Mobility Device written order  Power Mobility Device written order   Imported By: Dorna Leitz 03/02/2011 14:00:59  _____________________________________________________________________  External Attachment:    Type:   Image     Comment:   External Document

## 2011-03-11 NOTE — Letter (Signed)
Summary: History Form  History Form   Imported By: Eugenio Hoes 03/03/2011 10:31:35  _____________________________________________________________________  External Attachment:    Type:   Image     Comment:   External Document

## 2011-03-13 ENCOUNTER — Ambulatory Visit: Payer: Medicare Other | Admitting: Cardiovascular Disease

## 2011-03-14 ENCOUNTER — Encounter: Payer: Self-pay | Admitting: Cardiovascular Disease

## 2011-03-24 ENCOUNTER — Encounter: Payer: Self-pay | Admitting: Cardiovascular Disease

## 2011-03-25 ENCOUNTER — Encounter: Payer: Self-pay | Admitting: Cardiovascular Disease

## 2011-03-25 ENCOUNTER — Ambulatory Visit (INDEPENDENT_AMBULATORY_CARE_PROVIDER_SITE_OTHER): Payer: Medicare Other | Admitting: Cardiovascular Disease

## 2011-03-25 DIAGNOSIS — J984 Other disorders of lung: Secondary | ICD-10-CM

## 2011-03-25 DIAGNOSIS — I10 Essential (primary) hypertension: Secondary | ICD-10-CM

## 2011-03-25 DIAGNOSIS — I498 Other specified cardiac arrhythmias: Secondary | ICD-10-CM

## 2011-03-25 DIAGNOSIS — J449 Chronic obstructive pulmonary disease, unspecified: Secondary | ICD-10-CM

## 2011-03-25 DIAGNOSIS — E785 Hyperlipidemia, unspecified: Secondary | ICD-10-CM

## 2011-03-25 DIAGNOSIS — I4891 Unspecified atrial fibrillation: Secondary | ICD-10-CM

## 2011-03-25 DIAGNOSIS — F17200 Nicotine dependence, unspecified, uncomplicated: Secondary | ICD-10-CM

## 2011-03-25 DIAGNOSIS — R0602 Shortness of breath: Secondary | ICD-10-CM

## 2011-03-25 MED ORDER — FLUTICASONE-SALMETEROL 500-50 MCG/DOSE IN AEPB: 1.0000 | INHALATION_SPRAY | Freq: Two times a day (BID) | RESPIRATORY_TRACT | Status: DC

## 2011-03-25 MED ORDER — DABIGATRAN ETEXILATE MESYLATE 150 MG PO CAPS: 150.0000 mg | ORAL_CAPSULE | Freq: Two times a day (BID) | ORAL | Status: DC

## 2011-03-25 MED ORDER — IPRATROPIUM-ALBUTEROL 0.5-2.5 (3) MG/3ML IN SOLN: 3.0000 mL | Freq: Two times a day (BID) | RESPIRATORY_TRACT | Status: DC | PRN

## 2011-03-25 MED ORDER — COENZYME Q10 100 MG PO CAPS: 100.0000 mg | ORAL_CAPSULE | Freq: Every day | ORAL | Status: DC

## 2011-03-25 MED ORDER — SIMVASTATIN 20 MG PO TABS: 20.0000 mg | ORAL_TABLET | Freq: Every day | ORAL | Status: DC

## 2011-03-25 MED ORDER — ALBUTEROL SULFATE HFA 108 (90 BASE) MCG/ACT IN AERS: 2.0000 | INHALATION_SPRAY | RESPIRATORY_TRACT | Status: DC | PRN

## 2011-03-25 MED ORDER — GABAPENTIN 300 MG PO CAPS: 300.0000 mg | ORAL_CAPSULE | Freq: Two times a day (BID) | ORAL | Status: DC | PRN

## 2011-03-25 MED ORDER — BUPROPION HCL ER (SR) 150 MG PO TB12: 150.0000 mg | ORAL_TABLET | Freq: Two times a day (BID) | ORAL | Status: DC

## 2011-03-25 MED ORDER — DOXYCYCLINE HYCLATE 100 MG PO CAPS: 100.0000 mg | ORAL_CAPSULE | Freq: Two times a day (BID) | ORAL | Status: DC

## 2011-03-25 MED ORDER — AMIODARONE HCL 200 MG PO TABS: 200.0000 mg | ORAL_TABLET | Freq: Every day | ORAL | Status: DC

## 2011-03-25 MED ORDER — OMEPRAZOLE-SODIUM BICARBONATE 40-1100 MG PO CAPS: 1.0000 | ORAL_CAPSULE | Freq: Every day | ORAL | Status: DC

## 2011-03-25 MED ORDER — CARISOPRODOL 250 MG PO TABS: 250.0000 mg | ORAL_TABLET | Freq: Every evening | ORAL | Status: DC | PRN

## 2011-03-25 NOTE — Assessment & Plan Note (Signed)
We have suggested he stay on his current medication regimen, simvastatin.

## 2011-03-25 NOTE — Assessment & Plan Note (Signed)
I believe that much of his shortness of breath is from COPD, obesity and being deconditioned. I encouraged him to increase his walking, decrease his weight

## 2011-03-25 NOTE — Assessment & Plan Note (Signed)
He is maintaining normal sinus rhythm. He has been unable to tolerate any rate or rhythm control medications. We will just monitor him for now. He is on anticoagulation though did have a recent fall and significantly bruised his front. I asked him to contact our office if he has additional falls. It would seem that his legs are getting progressively weaker.

## 2011-03-25 NOTE — Assessment & Plan Note (Signed)
He is very concerned about his bradycardia as being the cause of his shortness of breath and fatigue. Heart rate in the office today he is in the 60s. He is not on any medications that would slow his rate. I did mention that if we had a Holter confirming significant bradycardia, the options would include a pacemaker. He was not interested at this time.

## 2011-03-25 NOTE — Assessment & Plan Note (Signed)
Blood pressure is well controlled on today's visit. No changes made to the medications. 

## 2011-03-25 NOTE — Patient Instructions (Addendum)
Increase your walking and exercise. No medication changes were made. Please call us if you have new issues that need to be addressed before your next appt.  We will call you for a follow up Appt. 6 months

## 2011-03-25 NOTE — Assessment & Plan Note (Signed)
He is currently in transition from one primary care physician to [facility]. We will refill his medications for a short period to allow him time to get reestablished.

## 2011-03-25 NOTE — Assessment & Plan Note (Signed)
He was unable to tolerate Chantix or Wellbutrin. We will continue to work with him on his smoking cessation

## 2011-03-25 NOTE — Progress Notes (Signed)
   Patient ID: [name], male    DOB: [birth year], 65 y.o.   MRN: [id number]  HPI Comments: 65 yo with history of COPD, obesity, exertional dyspnea, and paroxysmal atrial fibrillation presents for cardiology followup.     He reports feeling fatigued, short of breath with exertion. He blames his slow heart rate. He reports having heart rates in the 50s on a regular basis and to take significant exertion to get his heart rate in the 60s. Is not taking any medications that would slow his heart rate and he has stopped his amiodarone, beta blockers in the past. 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 His sleep is disordered secondary to work. He is trying to work on his garden at home with the bigger and he is very short of breath with exertion. His weight has been challenging and slowly trending upward.   Notes indicate previous myalgias on statin including Lipitor and Zocor.    Sinus rhythm with rate 64 beats per minute with no significant ST or T wave changes.   Labs (12/11): LDL 148, HDL 38, LFTs normal, K 4.3, creatinine 1.01      Review of Systems  Constitutional: Positive for fatigue and unexpected weight change.  HENT: Negative.   Eyes: Negative.   Respiratory: Positive for shortness of breath.   Cardiovascular: Negative.   Gastrointestinal: Negative.   Musculoskeletal: Negative.   Skin: Negative.   Neurological: Negative.   Hematological: Negative.   Psychiatric/Behavioral: Negative.   All other systems reviewed and are negative.    BP 110/70  Pulse 64  Ht 6\' 1"  (1.854 m)  Wt 283 lb (128.368 kg)  BMI 37.34 kg/m2   Physical Exam  Nursing note and vitals reviewed. Constitutional: He is oriented to person, place, and time. He appears well-developed and well-nourished.       Obese.   HENT:  Head: Normocephalic.  Nose: Nose normal.  Mouth/Throat: Oropharynx is clear and moist.  Eyes: Conjunctivae are normal. Pupils are equal,  round, and reactive to light.  Neck: Normal range of motion. Neck supple. No JVD present.  Cardiovascular: Normal rate, regular rhythm, S1 normal, S2 normal, normal heart sounds and intact distal pulses.  Exam reveals no gallop and no friction rub.   No murmur heard. Pulmonary/Chest: Effort normal. No respiratory distress. He has decreased breath sounds. He has wheezes. He has no rales. He exhibits no tenderness.  Abdominal: Soft. Bowel sounds are normal. He exhibits no distension. There is no tenderness.  Musculoskeletal: Normal range of motion. He exhibits no edema and no tenderness.  Lymphadenopathy:    He has no cervical adenopathy.  Neurological: He is alert and oriented to person, place, and time. Coordination normal.  Skin: Skin is warm and dry. No rash noted. No erythema.  Psychiatric: He has a normal mood and affect. His behavior is normal. Judgment and thought content normal.           Assessment and Plan

## 2011-03-26 ENCOUNTER — Other Ambulatory Visit: Payer: Self-pay | Admitting: Emergency Medicine

## 2011-03-27 ENCOUNTER — Telehealth: Payer: Self-pay | Admitting: Emergency Medicine

## 2011-03-27 NOTE — Telephone Encounter (Signed)
Called patient regarding this rx and pt taking OTC omeprazole already and said it is working well for him. Pt stated to cancel rx for Zegerid. Pharmacy notified.  Pt also wanted samples for Pradaxa and notified pt I left them up front for him. Also patient hasn't been established with a PCP yet but "will get set up with a doctor in the next 2 weeks". Pt is looking into [place] at [facility].

## 2011-03-27 NOTE — Telephone Encounter (Signed)
Pt's Zegerid is not covered by insurance. What other medication can pt try?

## 2011-03-27 NOTE — Telephone Encounter (Signed)
Buy over the counter omeprazole. Stomach medication.  Does he have a PMD set up yet?

## 2011-04-15 ENCOUNTER — Telehealth: Payer: Self-pay | Admitting: *Deleted

## 2011-04-15 ENCOUNTER — Encounter: Payer: Medicare Other | Admitting: Family Medicine

## 2011-04-15 MED ORDER — WARFARIN SODIUM 5 MG PO TABS: 5.0000 mg | ORAL_TABLET | Freq: Every day | ORAL | Status: DC

## 2011-04-15 NOTE — Telephone Encounter (Signed)
Pt called stating that he can no longer take Pradaxa. He c/o of "serious GI cramps/pain, bloating, and nausea." Pt decreased his dose to once daily (on his own) and then took himself off the med on 04/12/11. At last ov pt was in NSR, but I explained to pt with his h/o PAF, the importance of being anticoagulated to prevent clot formation. Pt verbalizes understanding of this. 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 Pt will start Coumadin 5mg  once daily and I have scheduled him to come in in 1 week for his initial coumadin visit with Cicero Duck on 04/23/11. Advised pt to take pill at night.

## 2011-04-17 ENCOUNTER — Encounter: Payer: Self-pay | Admitting: Family Medicine

## 2011-04-17 ENCOUNTER — Telehealth: Payer: Self-pay | Admitting: *Deleted

## 2011-04-17 ENCOUNTER — Ambulatory Visit (INDEPENDENT_AMBULATORY_CARE_PROVIDER_SITE_OTHER): Payer: Medicare Other | Admitting: Family Medicine

## 2011-04-17 DIAGNOSIS — J449 Chronic obstructive pulmonary disease, unspecified: Secondary | ICD-10-CM

## 2011-04-17 DIAGNOSIS — E1149 Type 2 diabetes mellitus with other diabetic neurological complication: Secondary | ICD-10-CM

## 2011-04-17 DIAGNOSIS — E1142 Type 2 diabetes mellitus with diabetic polyneuropathy: Secondary | ICD-10-CM

## 2011-04-17 DIAGNOSIS — F17200 Nicotine dependence, unspecified, uncomplicated: Secondary | ICD-10-CM

## 2011-04-17 DIAGNOSIS — E785 Hyperlipidemia, unspecified: Secondary | ICD-10-CM

## 2011-04-17 DIAGNOSIS — M2550 Pain in unspecified joint: Secondary | ICD-10-CM

## 2011-04-17 DIAGNOSIS — J4489 Other specified chronic obstructive pulmonary disease: Secondary | ICD-10-CM

## 2011-04-17 DIAGNOSIS — I4891 Unspecified atrial fibrillation: Secondary | ICD-10-CM

## 2011-04-17 MED ORDER — GABAPENTIN 600 MG PO TABS: 600.0000 mg | ORAL_TABLET | Freq: Three times a day (TID) | ORAL | Status: DC

## 2011-04-17 MED ORDER — ALBUTEROL SULFATE HFA 108 (90 BASE) MCG/ACT IN AERS: 2.0000 | INHALATION_SPRAY | Freq: Four times a day (QID) | RESPIRATORY_TRACT | Status: DC | PRN

## 2011-04-17 MED ORDER — GABAPENTIN 300 MG PO CAPS: 600.0000 mg | ORAL_CAPSULE | Freq: Three times a day (TID) | ORAL | Status: DC

## 2011-04-17 MED ORDER — CYCLOBENZAPRINE HCL 10 MG PO TABS: 10.0000 mg | ORAL_TABLET | Freq: Three times a day (TID) | ORAL | Status: AC | PRN

## 2011-04-17 MED ORDER — HYDROCODONE-ACETAMINOPHEN 5-500 MG PO TABS: 1.0000 | ORAL_TABLET | Freq: Three times a day (TID) | ORAL | Status: DC | PRN

## 2011-04-17 NOTE — Telephone Encounter (Signed)
Ok to refill. Sent in in OV today.

## 2011-04-17 NOTE — Patient Instructions (Addendum)
Schedule follow up appointment with Dr. Meredeth Ide - pass by Marion's office to schedule this. Take advair 1 puff at night daily.  Take albuterol and/or duoneb as needed for breathing - consider 2 puffs prior to bedtime.  We may add on spiriva.  I will request records from Dr. Meredeth Ide. Return to see me in 1 month for follow up.  Blood work likely then. Consider quitting smoking. Good to meet you today, call us with questions. Refilled vicodin (lortab) as well as flexeril as muscle relaxant and gabapentin.  Take flexeril 1/2 tablet daily to start.

## 2011-04-17 NOTE — Telephone Encounter (Signed)
Patients requests refill on Ventolin. I didn't see it on his med list and wasn't sure if this had been discussed with him at his visit. I was hesitant to refill without approval.   Thanks

## 2011-04-17 NOTE — Progress Notes (Signed)
Subjective:    Patient ID: [name], male    DOB: [birth year], 65 y.o.   MRN: [id number]  HPI CC: new medicare patient to establish  Presents with wife, Olegario Messier.  H/o GERD, COPD (emphysema), continued smoking, HLD (off statin), T2DM (dx 2011) diet controlled.  H/o HTN, but recently no problems.  Recently started warfarin for parox afib (replaces pradaxa, side effect of abd pain).  toprol and amiodarone caused too much bradycardia.  Followed by Dr. Mariah Milling.  H/o HTN - states usually well controlled however.   States tizanidine doesn't help as much as soma.  Has chronic muscle spasm in neck as well as leg spasms when sleeping (h/o paroxysmal leg spasms in chart).  COPD - supposed to be taking advair as well as duoneb and albuterol inhaler.  States taking advair 4-5 puffs nightly as needed because otherwise feels congested and trouble sleeping.  States albuterol doesn't help, doesn't take duoneb as much as should.  States if takes advair bid as scheduled, runs out and insurance won't cover any more.  States has had eval including PFTs and spirometry by pulm, due for f/u with them.  Requests we set up with f/u with [facility].  Previously saw Dr. Maryln Manuel at [facility] clinic, now transferring here as that clinic closing.   Preventative: Last tetanus unsure.  Declines today.  Will need at f/u appt. Last CPE - unsure.  Last year, later summer.  Last blood work 3 wks ago.  Medications and allergies reviewed and updated as above. PMHx, SurgHx, SHx and FMHx reviewed for relevance and updated in chart.  Review of Systems  Constitutional: Positive for fatigue. Negative for fever, chills, activity change, appetite change and unexpected weight change.  HENT: Negative for hearing loss and neck pain.   Eyes: Negative for visual disturbance.  Respiratory: Positive for cough (with phlegm at baseline) and shortness of breath. Negative for choking, chest tightness and wheezing.   Cardiovascular:  Positive for leg swelling. Negative for chest pain and palpitations.  Gastrointestinal: Positive for abdominal pain. Negative for nausea, vomiting, diarrhea, constipation and blood in stool.  Genitourinary: Negative for hematuria and difficulty urinating.  Musculoskeletal: Positive for myalgias and arthralgias.  Skin: Negative for rash.  Neurological: Negative for dizziness, seizures, syncope and headaches.  Hematological: Bruises/bleeds easily.  Psychiatric/Behavioral: Negative for dysphoric mood. The patient is not nervous/anxious.        Objective:   Physical Exam  Nursing note and vitals reviewed. Constitutional: He is oriented to person, place, and time. He appears well-developed and well-nourished. No distress.  HENT:  Head: Normocephalic and atraumatic.  Right Ear: External ear normal.  Left Ear: External ear normal.  Nose: Nose normal.  Mouth/Throat: Oropharynx is clear and moist.  Eyes: Conjunctivae and EOM are normal. Pupils are equal, round, and reactive to light.  Neck: Normal range of motion. Neck supple. Carotid bruit is not present. No thyromegaly present.  Cardiovascular: Normal rate, regular rhythm, normal heart sounds and intact distal pulses.   No murmur heard. Pulses:      Radial pulses are 2+ on the right side, and 2+ on the left side.  Pulmonary/Chest: Effort normal and breath sounds normal. No respiratory distress. He has no wheezes. He has no rales.  Abdominal: Soft. Bowel sounds are normal. He exhibits no distension and no mass. There is no tenderness. There is no rebound and no guarding.  Musculoskeletal: Normal range of motion.  Lymphadenopathy:    He has no cervical adenopathy.  Neurological: He is alert and oriented to person, place, and time.       CN grossly intact, station and gait intact  Skin: Skin is warm and dry. No rash noted.  Psychiatric: He has a normal mood and affect. His behavior is normal. Judgment and thought content normal.           Assessment & Plan:

## 2011-04-18 ENCOUNTER — Encounter: Payer: Self-pay | Admitting: Family Medicine

## 2011-04-18 NOTE — Assessment & Plan Note (Signed)
Due for A1c.  Will check when returns for blood work.

## 2011-04-18 NOTE — Assessment & Plan Note (Signed)
Increase gabapentin to 600mg  tid. Update [place] on how he does with this dose.

## 2011-04-18 NOTE — Assessment & Plan Note (Signed)
Off amiodarone and toprol.  bp stable, HR today stable.  Pt states does increase but then comes down.

## 2011-04-18 NOTE — Assessment & Plan Note (Addendum)
noncomplaince with meds as prescribed.  Again encourage to take as directed (advair 500 bid and albuterol /duoneb as needed). Return to pulm for f/u. Could try albuterol/duoneb.  Recommend do advair nightly instead of PRN.  If doing ok with just 1 puff at night, recommend add 1 puff in am.

## 2011-04-18 NOTE — Assessment & Plan Note (Signed)
Refilled lortab as well as trial of flexeril.  Hold soma for now.  H/o idiopathic nocturnal leg cramps.

## 2011-04-18 NOTE — Assessment & Plan Note (Signed)
Precontemplative.  States has no interest in quitting.   Advised I will keep bringing this up as I feel thins is important to his health.  Pt agrees.

## 2011-04-18 NOTE — Assessment & Plan Note (Signed)
States unable to tolerate lipitor/rsimvastatin.

## 2011-04-22 ENCOUNTER — Ambulatory Visit (INDEPENDENT_AMBULATORY_CARE_PROVIDER_SITE_OTHER): Payer: Medicare Other | Admitting: *Deleted

## 2011-04-22 DIAGNOSIS — I4891 Unspecified atrial fibrillation: Secondary | ICD-10-CM

## 2011-04-22 LAB — POCT INR: INR: 2.4

## 2011-04-23 ENCOUNTER — Encounter: Payer: Medicare Other | Admitting: Emergency Medicine

## 2011-04-30 ENCOUNTER — Other Ambulatory Visit: Payer: Self-pay | Admitting: *Deleted

## 2011-04-30 ENCOUNTER — Encounter: Payer: Medicare Other | Admitting: Emergency Medicine

## 2011-04-30 ENCOUNTER — Telehealth: Payer: Self-pay | Admitting: *Deleted

## 2011-04-30 MED ORDER — OMEPRAZOLE-SODIUM BICARBONATE 40-1100 MG PO CAPS: 1.0000 | ORAL_CAPSULE | Freq: Every day | ORAL | Status: DC

## 2011-04-30 NOTE — Telephone Encounter (Signed)
The omeprazole is not covered by patient's insurance. [facility] is asking if they can change it to something else.

## 2011-04-30 NOTE — Telephone Encounter (Signed)
Spoke with pharmacist. The Zegerid wasn't covered but plain omeprazole was. He was given 40mg . Updated in chart.

## 2011-04-30 NOTE — Telephone Encounter (Signed)
What is covered?  Ok to switch.

## 2011-05-01 ENCOUNTER — Telehealth: Payer: Self-pay | Admitting: *Deleted

## 2011-05-01 ENCOUNTER — Encounter: Payer: Self-pay | Admitting: Cardiovascular Disease

## 2011-05-01 ENCOUNTER — Other Ambulatory Visit: Payer: Self-pay | Admitting: Cardiovascular Disease

## 2011-05-01 NOTE — Telephone Encounter (Signed)
Pt called c/o severe "gas" and stomach pressure, pt states he has been "burping" and feels miserable. Pt is worried could be related to heart. Pt saw Dr. Renee Ramus 2 weeks prior to establish care and was started on Zegerid to replace Prilosec, pt has been unable to afford the switch so he has not been taking either for about a week. Pt is going to get refill of omeprazole at pharmacy today. Advised pt to start this med, also take otc antacid (maalox) and keep bland diet until symptoms improve. Notified pt to f/u with pcp if problem persists. Told pt with what he is telling me, I do not think this is cardiac related, however, if symptoms worsen or change in nature to call me here at office, otherwise, f/u with pcp.

## 2011-05-07 ENCOUNTER — Ambulatory Visit (INDEPENDENT_AMBULATORY_CARE_PROVIDER_SITE_OTHER): Payer: Medicare Other | Admitting: Emergency Medicine

## 2011-05-07 DIAGNOSIS — Z7901 Long term (current) use of anticoagulants: Secondary | ICD-10-CM

## 2011-05-07 DIAGNOSIS — I4891 Unspecified atrial fibrillation: Secondary | ICD-10-CM

## 2011-05-07 LAB — POCT INR: INR: 3

## 2011-05-09 ENCOUNTER — Other Ambulatory Visit: Payer: Self-pay | Admitting: *Deleted

## 2011-05-09 MED ORDER — FLUTICASONE-SALMETEROL 500-50 MCG/DOSE IN AEPB: 1.0000 | INHALATION_SPRAY | Freq: Two times a day (BID) | RESPIRATORY_TRACT | Status: DC

## 2011-05-11 ENCOUNTER — Encounter: Payer: Self-pay | Admitting: Family Medicine

## 2011-05-12 ENCOUNTER — Other Ambulatory Visit: Payer: Self-pay | Admitting: Cardiovascular Disease

## 2011-05-12 ENCOUNTER — Other Ambulatory Visit: Payer: Self-pay | Admitting: *Deleted

## 2011-05-12 MED ORDER — CYCLOBENZAPRINE HCL 10 MG PO TABS: 10.0000 mg | ORAL_TABLET | Freq: Every evening | ORAL | Status: DC | PRN

## 2011-05-12 MED ORDER — HYDROCODONE-ACETAMINOPHEN 5-500 MG PO TABS: 1.0000 | ORAL_TABLET | Freq: Three times a day (TID) | ORAL | Status: DC | PRN

## 2011-05-12 NOTE — Telephone Encounter (Signed)
Rx's called in as directed. Patient aware.

## 2011-05-12 NOTE — Telephone Encounter (Signed)
Ok to refill.  Notify pt.

## 2011-05-12 NOTE — Telephone Encounter (Signed)
Please see notes below.  Pt is also requesting a greater quantity on both scripts.  Says 30 pills doesn't last very long.

## 2011-05-12 NOTE — Telephone Encounter (Signed)
Okay to refill both meds? I didn't see flexeril on his current list.

## 2011-05-12 NOTE — Telephone Encounter (Signed)
Pt states flexeril was originally prescribed by Dr. Laural Benes at [facility] clinic.  He says he needs these meds, he is out.

## 2011-05-14 ENCOUNTER — Telehealth: Payer: Self-pay | Admitting: *Deleted

## 2011-05-14 NOTE — Telephone Encounter (Signed)
Prior Berkley Harvey is needed for ventolin, form is in your box.

## 2011-05-15 NOTE — Telephone Encounter (Signed)
Spoke with Revonda Standard at the pharmacy. She said Proair would be covered. Advised same sig.

## 2011-05-15 NOTE — Telephone Encounter (Signed)
Ok to refill whichever albuterol inhaler covered by insurance.  Please check with pharmacy and send in.  Same sig: 2 puffs Q6 hours prn SOB/wheeze, #1, RF 11

## 2011-05-20 ENCOUNTER — Telehealth: Payer: Self-pay | Admitting: *Deleted

## 2011-05-20 NOTE — Telephone Encounter (Signed)
Prior auth given for prescription solutions, form is in your box.

## 2011-05-20 NOTE — Telephone Encounter (Signed)
Noted thanks °

## 2011-05-21 ENCOUNTER — Ambulatory Visit (INDEPENDENT_AMBULATORY_CARE_PROVIDER_SITE_OTHER): Payer: Medicare Other | Admitting: Emergency Medicine

## 2011-05-21 DIAGNOSIS — I4891 Unspecified atrial fibrillation: Secondary | ICD-10-CM

## 2011-05-21 DIAGNOSIS — Z7901 Long term (current) use of anticoagulants: Secondary | ICD-10-CM

## 2011-05-22 ENCOUNTER — Encounter: Payer: Self-pay | Admitting: Family Medicine

## 2011-05-22 ENCOUNTER — Ambulatory Visit (INDEPENDENT_AMBULATORY_CARE_PROVIDER_SITE_OTHER): Payer: Medicare Other | Admitting: Family Medicine

## 2011-05-22 DIAGNOSIS — D72829 Elevated white blood cell count, unspecified: Secondary | ICD-10-CM

## 2011-05-22 DIAGNOSIS — M542 Cervicalgia: Secondary | ICD-10-CM

## 2011-05-22 DIAGNOSIS — M2550 Pain in unspecified joint: Secondary | ICD-10-CM

## 2011-05-22 DIAGNOSIS — G8929 Other chronic pain: Secondary | ICD-10-CM

## 2011-05-22 DIAGNOSIS — K219 Gastro-esophageal reflux disease without esophagitis: Secondary | ICD-10-CM

## 2011-05-22 DIAGNOSIS — R209 Unspecified disturbances of skin sensation: Secondary | ICD-10-CM

## 2011-05-22 DIAGNOSIS — J449 Chronic obstructive pulmonary disease, unspecified: Secondary | ICD-10-CM

## 2011-05-22 MED ORDER — OMEPRAZOLE 40 MG PO CPDR: 40.0000 mg | DELAYED_RELEASE_CAPSULE | Freq: Every day | ORAL | Status: DC
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
## 2011-05-22 NOTE — Patient Instructions (Signed)
I've refilled omeprazole.  I'm glad it's helping. Try to do advair twice daily, use albuterol as rescue medicine as you're currently doing. Good to see you today, call us with questions. Handout for stretching exercises provided for neck. Return in 1 month for follow up after lung appointment.

## 2011-05-23 ENCOUNTER — Encounter: Payer: Self-pay | Admitting: Family Medicine

## 2011-05-23 NOTE — Assessment & Plan Note (Signed)
Improved compliance with how he's supposed to take med.  Currently taking advair 1-2 puffs consistently at night, whic is an imporvemnet from previous.  Again discussed controller vs rescue medicine and how controllers may not have immediate noticeable effect. rec try advair bid to see if improvement in SOB he describes. To establish wnew ith pulm in coming weeks, may benefit from rpt PFTs.

## 2011-05-23 NOTE — Assessment & Plan Note (Signed)
Anticipate arthritis component. No red flags.  Supportive care with stretching, rest, tylenol.  Update [place] if no better for possible xray/MRI vs PT eval.

## 2011-05-23 NOTE — Assessment & Plan Note (Signed)
Significantly better on omeprazole.  Continue.

## 2011-05-23 NOTE — Assessment & Plan Note (Addendum)
Continue pain regimen - vicodin, flexeril, gabapentin.

## 2011-05-26 ENCOUNTER — Other Ambulatory Visit: Payer: Self-pay | Admitting: *Deleted

## 2011-05-26 MED ORDER — FLUTICASONE-SALMETEROL 500-50 MCG/DOSE IN AEPB: 1.0000 | INHALATION_SPRAY | Freq: Two times a day (BID) | RESPIRATORY_TRACT | Status: DC

## 2011-05-27 ENCOUNTER — Telehealth: Payer: Self-pay | Admitting: *Deleted
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
## 2011-05-28 NOTE — Telephone Encounter (Signed)
Reviewed. EWJ

## 2011-05-29 ENCOUNTER — Ambulatory Visit (INDEPENDENT_AMBULATORY_CARE_PROVIDER_SITE_OTHER)
Admission: RE | Admit: 2011-05-29 | Discharge: 2011-05-29 | Disposition: A | Discharge status: Home / Self Care | Payer: Medicare Other | Source: Ambulatory Visit | Attending: Family Medicine | Admitting: Family Medicine

## 2011-05-29 ENCOUNTER — Encounter: Payer: Self-pay | Admitting: Cardiovascular Disease

## 2011-05-29 ENCOUNTER — Ambulatory Visit (INDEPENDENT_AMBULATORY_CARE_PROVIDER_SITE_OTHER): Payer: Medicare Other | Admitting: Cardiovascular Disease

## 2011-05-29 ENCOUNTER — Other Ambulatory Visit (INDEPENDENT_AMBULATORY_CARE_PROVIDER_SITE_OTHER): Payer: Medicare Other | Admitting: *Deleted

## 2011-05-29 ENCOUNTER — Telehealth: Payer: Self-pay | Admitting: *Deleted

## 2011-05-29 ENCOUNTER — Encounter: Payer: Self-pay | Admitting: Family Medicine

## 2011-05-29 ENCOUNTER — Ambulatory Visit (INDEPENDENT_AMBULATORY_CARE_PROVIDER_SITE_OTHER): Payer: Medicare Other | Admitting: Family Medicine

## 2011-05-29 DIAGNOSIS — R079 Chest pain, unspecified: Secondary | ICD-10-CM

## 2011-05-29 DIAGNOSIS — M542 Cervicalgia: Secondary | ICD-10-CM

## 2011-05-29 DIAGNOSIS — M25569 Pain in unspecified knee: Secondary | ICD-10-CM

## 2011-05-29 DIAGNOSIS — I4891 Unspecified atrial fibrillation: Secondary | ICD-10-CM

## 2011-05-29 DIAGNOSIS — Z7901 Long term (current) use of anticoagulants: Secondary | ICD-10-CM

## 2011-05-29 DIAGNOSIS — S0990XA Unspecified injury of head, initial encounter: Secondary | ICD-10-CM

## 2011-05-29 DIAGNOSIS — T1490XA Injury, unspecified, initial encounter: Secondary | ICD-10-CM

## 2011-05-29 DIAGNOSIS — R42 Dizziness and giddiness: Secondary | ICD-10-CM

## 2011-05-29 DIAGNOSIS — IMO0002 Reserved for concepts with insufficient information to code with codable children: Secondary | ICD-10-CM

## 2011-05-29 DIAGNOSIS — M545 Low back pain: Secondary | ICD-10-CM

## 2011-05-29 DIAGNOSIS — S6990XA Unspecified injury of unspecified wrist, hand and finger(s), initial encounter: Secondary | ICD-10-CM

## 2011-05-29 LAB — POCT INR: INR: 2.7

## 2011-05-29 MED ORDER — DICLOFENAC SODIUM 1 % TD GEL: 1.0000 "application " | Freq: Three times a day (TID) | TRANSDERMAL | Status: DC | PRN
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
## 2011-05-29 NOTE — Assessment & Plan Note (Signed)
He remains in NSR by ECG today.  His INR is theraputic.

## 2011-05-29 NOTE — Telephone Encounter (Signed)
Patient is going for CT of head and neck at 1:00 and has appt to see Dr. Sharen Hones here today at 2:00.
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
## 2011-05-29 NOTE — Assessment & Plan Note (Signed)
xrays of R knee, L thumb, and acute abd series obtained.  All without acute process. Anticipate multiple abrasions/contusions, diffuse muscle/ligament sprains.   Thumb - sprain vs tenosynovitis. R knee - effusion present with swelling.  Patellar contusion vs other.  Ligaments seem intact.  xrays without fracture.  Supportive care in form of ice, elevation of knee, and voltaren.  If not better, referral to ortho for further eval. Unable to take NSAIDs 2/2 coumadin.  Sent home with voltaren for thumb as well as for knee. Strongly recommended eval at ER, pt refuses.  Reviewed red flags to go to ER.  Pt agrees if any HA or AMS or worsneing pain uncontrolled by pain relieves, will seek ER care. O/w call tomorrwo with update on knee pain

## 2011-05-29 NOTE — Telephone Encounter (Signed)
Noted  

## 2011-05-29 NOTE — Patient Instructions (Signed)
We have scheduled a CT of head and neck downstairs at [facility] @ 1pm Your physician recommends that you schedule a follow-up appointment w/ Dr. Sharen Hones @ 2pm.

## 2011-05-30 ENCOUNTER — Telehealth: Payer: Self-pay | Admitting: *Deleted
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
## 2011-05-30 NOTE — Telephone Encounter (Signed)
Routed order to [facility].

## 2011-05-30 NOTE — Telephone Encounter (Signed)
Rx placed up front for pick up. Patient aware.  

## 2011-05-30 NOTE — Telephone Encounter (Signed)
Notified patient and he will pick up order for toilet seat. I told him HH will call him to get something set up for PT and that we would reevaluate if he had any fever, SOB or increase in cough. We were not sending in abx at this time. He verbalized understanding.  I will need order for seat.
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
## 2011-06-03 ENCOUNTER — Telehealth: Payer: Self-pay | Admitting: *Deleted

## 2011-06-03 DIAGNOSIS — M79609 Pain in unspecified limb: Secondary | ICD-10-CM

## 2011-06-03 DIAGNOSIS — R252 Cramp and spasm: Secondary | ICD-10-CM

## 2011-06-03 DIAGNOSIS — E1149 Type 2 diabetes mellitus with other diabetic neurological complication: Secondary | ICD-10-CM

## 2011-06-03 DIAGNOSIS — M545 Low back pain: Secondary | ICD-10-CM

## 2011-06-03 DIAGNOSIS — E1142 Type 2 diabetes mellitus with diabetic polyneuropathy: Secondary | ICD-10-CM

## 2011-06-03 DIAGNOSIS — G8929 Other chronic pain: Secondary | ICD-10-CM

## 2011-06-03 DIAGNOSIS — M2550 Pain in unspecified joint: Secondary | ICD-10-CM

## 2011-06-03 NOTE — Telephone Encounter (Signed)
Has PT come out to home for evaluation? Have written script for hospital bed and placed in [name]'s box.

## 2011-06-03 NOTE — Telephone Encounter (Signed)
Molli Knock notified as instructed by telephone. [name] said no one has called or come out in re; to PT. [name] will pick up rx for hospital bed at front desk 06/04/11.

## 2011-06-03 NOTE — Telephone Encounter (Signed)
Placed HH referral in chart.  Will route to [place].
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
## 2011-06-04 ENCOUNTER — Telehealth: Payer: Self-pay | Admitting: *Deleted

## 2011-06-04 ENCOUNTER — Encounter: Payer: Medicare Other | Admitting: Emergency Medicine

## 2011-06-04 DIAGNOSIS — M25561 Pain in right knee: Secondary | ICD-10-CM

## 2011-06-04 NOTE — Telephone Encounter (Signed)
Wife came in to pick up rx for hospital bed. She is now asking for a rx for the recliner because it will also be covered with his insurance. She will come in to pick up rx. She is aware that you are out of the office today.

## 2011-06-05 ENCOUNTER — Other Ambulatory Visit: Payer: Self-pay | Admitting: *Deleted

## 2011-06-05 DIAGNOSIS — M25561 Pain in right knee: Secondary | ICD-10-CM | POA: Insufficient documentation

## 2011-06-05 MED ORDER — HYDROCODONE-ACETAMINOPHEN 5-500 MG PO TABS: 1.0000 | ORAL_TABLET | Freq: Three times a day (TID) | ORAL | Status: DC | PRN

## 2011-06-05 NOTE — Telephone Encounter (Signed)
Patient's wife notified as instructed by telephone. Medication phoned to [facility] pharmacy as instructed.

## 2011-06-05 NOTE — Telephone Encounter (Signed)
Patient notified as instructed by telephone. Pt said he has already paid for recliner with lift and car insurance co said they would pay if had rx. Pt does not want to wait for PT. Home health will not be out to see pt until Monday.Please advise. Also patients knee pain is not a lot better. Pt is walking with crutches or walker and has sharp pain behind knee when he tries to walk. Pain level now is 8. The leg is also still very swollen. Pt is agreeable for ortho consult and would like to see ortho in [place]. If Dr Sharen Hones will order ortho consult pt will wait to hear from pt care coordinator about appt.
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
## 2011-06-05 NOTE — Telephone Encounter (Signed)
Will wait on evaluation by PT, as likely will be needed prior to approval by insurance.  this is pending.  Hopefully HH will come out today. Can we call with update in knee pain?  If not improving I'd like to refer to ortho.

## 2011-06-05 NOTE — Telephone Encounter (Signed)
Pt called again. He is still asking for an order for a recliner- says he can sleep in this and elevate his legs.  States he doesn't have room in his house for a hospital bed.  Also, he needs a note for his [employer] stating that he cant work for a couple of weeks. Also he is asking for a refill on vicodin, wants 60, uses [facility] [address]

## 2011-06-05 NOTE — Telephone Encounter (Signed)
Please phone in vicodin refill.

## 2011-06-05 NOTE — Telephone Encounter (Signed)
Patient's wife notified as instructed by telephone. Rx for recliner chair and note for work is ready for pick up at front desk. Rx for recliner chair sent to be scanned. Pt will wait to hear from patient care coordinator about ortho appt and can be reached at [phone].

## 2011-06-06 NOTE — Telephone Encounter (Signed)
Orthepedic appointment scheduled at [facility] for July 10th.the patient is aware..cdavis 06-06-2011

## 2011-06-09 ENCOUNTER — Telehealth: Payer: Self-pay | Admitting: *Deleted

## 2011-06-09 NOTE — Telephone Encounter (Signed)
Home Health scheduled.Marland KitchenMarland KitchenMarland Kitchen

## 2011-06-09 NOTE — Telephone Encounter (Signed)
Patient notified and letter placed up front for pick up. 

## 2011-06-09 NOTE — Telephone Encounter (Signed)
Printed letter and routed to [facility] [employer].

## 2011-06-09 NOTE — Telephone Encounter (Signed)
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 He needs this for insurance purposes. His wife will pick letter up.

## 2011-06-10 ENCOUNTER — Other Ambulatory Visit (INDEPENDENT_AMBULATORY_CARE_PROVIDER_SITE_OTHER): Payer: Medicare Other | Admitting: *Deleted

## 2011-06-10 DIAGNOSIS — Z7901 Long term (current) use of anticoagulants: Secondary | ICD-10-CM

## 2011-06-10 DIAGNOSIS — I4891 Unspecified atrial fibrillation: Secondary | ICD-10-CM

## 2011-06-16 ENCOUNTER — Telehealth: Payer: Self-pay | Admitting: *Deleted
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
## 2011-06-16 NOTE — Telephone Encounter (Signed)
Please see note below, please call pt when ready and his wife will pick up.

## 2011-06-17 ENCOUNTER — Encounter: Payer: Self-pay | Admitting: *Deleted

## 2011-06-17 NOTE — Telephone Encounter (Signed)
Letter composed and placed up front for pick up. Patient aware.

## 2011-06-19 NOTE — Telephone Encounter (Signed)
Can we call and see how home health is doing, and tosee when ortho appointment is scheudled?

## 2011-06-20 NOTE — Telephone Encounter (Signed)
Spoke with patient's wife. She said home health is going good. They are coming out and checking his vitals and making sure everything is okay. They haven't had any problems or issues arise. He went to [facility] on 06-17-11. The doctor said he had a herniated disk and mentioned an MRI, but hasn't scheduled one as of now. He said his knee/leg would remain swollen for some time, but it should improve slowly. I told her to call if he had any problems or concerns and she advised they would. She said he is doing okay for now.

## 2011-06-22 NOTE — Telephone Encounter (Signed)
Noted. Thanks.

## 2011-07-02 ENCOUNTER — Other Ambulatory Visit: Payer: Self-pay | Admitting: *Deleted

## 2011-07-02 NOTE — Telephone Encounter (Signed)
Wife says that per pharmacy the instructions need to be written differently in order for medicare to cover. For the hydrocodone it needs to say take 3 tablets by mouth daily in stead of take one every 8 hrs.

## 2011-07-03 ENCOUNTER — Telehealth: Payer: Self-pay | Admitting: *Deleted

## 2011-07-03 MED ORDER — CYCLOBENZAPRINE HCL 10 MG PO TABS: 10.0000 mg | ORAL_TABLET | Freq: Every evening | ORAL | Status: DC | PRN

## 2011-07-03 MED ORDER — HYDROCODONE-ACETAMINOPHEN 5-500 MG PO TABS: ORAL_TABLET | ORAL | Status: DC

## 2011-07-03 MED ORDER — HYDROCODONE-ACETAMINOPHEN 5-500 MG PO TABS: 1.0000 | ORAL_TABLET | Freq: Three times a day (TID) | ORAL | Status: DC | PRN

## 2011-07-03 NOTE — Telephone Encounter (Signed)
Sent in flexeril refill. Please phone in hydrocodone and notify wife.

## 2011-07-03 NOTE — Telephone Encounter (Signed)
Noted thanks °

## 2011-07-03 NOTE — Telephone Encounter (Signed)
Phone call from [facility]ist at [facility], calling to clarify directions on vicodin that were left on their voice mail.  I told him that pt's wife had called and stated that they said directions need to be for 3 a day, instead of one every 8 hours, so that medicare would pay for it.  Pharamacist said that isnt true, no one from their pharmacy told her that, he said that we need to send in a new script with the old directions of one every 8 hours.  He said either way it's 3 a day, but directions need to be more specific.

## 2011-07-03 NOTE — Telephone Encounter (Signed)
Rx called in as directed and patient's wife notified.  

## 2011-07-03 NOTE — Telephone Encounter (Signed)
plz phone in and notify wife.

## 2011-07-03 NOTE — Telephone Encounter (Signed)
Called and clarified with pharmacy. Patient's wife notified and said she must've misunderstood what they told her or she thought maybe it was the quantity of pills. I told her this was a PRN medication and not one that he MUST take every 8 hours, so 90 pills were not needed. He was to only take it if he was in SEVERE pain. She verbalized understanding.

## 2011-07-09 ENCOUNTER — Ambulatory Visit (INDEPENDENT_AMBULATORY_CARE_PROVIDER_SITE_OTHER): Payer: Medicare Other | Admitting: Emergency Medicine

## 2011-07-09 DIAGNOSIS — M5090 Cervical disc disorder, unspecified, unspecified cervical region: Secondary | ICD-10-CM

## 2011-07-09 DIAGNOSIS — I4891 Unspecified atrial fibrillation: Secondary | ICD-10-CM

## 2011-07-09 HISTORY — DX: Cervical disc disorder, unspecified, unspecified cervical region: M50.90

## 2011-07-16 ENCOUNTER — Ambulatory Visit: Payer: Medicare Other | Admitting: Cardiovascular Disease

## 2011-07-22 ENCOUNTER — Encounter: Payer: Self-pay | Admitting: Cardiovascular Disease

## 2011-07-22 ENCOUNTER — Encounter: Payer: Self-pay | Admitting: *Deleted

## 2011-07-22 ENCOUNTER — Ambulatory Visit (INDEPENDENT_AMBULATORY_CARE_PROVIDER_SITE_OTHER): Payer: Medicare Other | Admitting: Cardiovascular Disease

## 2011-07-22 DIAGNOSIS — R609 Edema, unspecified: Secondary | ICD-10-CM

## 2011-07-22 DIAGNOSIS — E1149 Type 2 diabetes mellitus with other diabetic neurological complication: Secondary | ICD-10-CM

## 2011-07-22 DIAGNOSIS — F17200 Nicotine dependence, unspecified, uncomplicated: Secondary | ICD-10-CM

## 2011-07-22 DIAGNOSIS — J449 Chronic obstructive pulmonary disease, unspecified: Secondary | ICD-10-CM

## 2011-07-22 DIAGNOSIS — R0609 Other forms of dyspnea: Secondary | ICD-10-CM

## 2011-07-22 DIAGNOSIS — R0602 Shortness of breath: Secondary | ICD-10-CM

## 2011-07-22 DIAGNOSIS — I4891 Unspecified atrial fibrillation: Secondary | ICD-10-CM

## 2011-07-22 DIAGNOSIS — E785 Hyperlipidemia, unspecified: Secondary | ICD-10-CM

## 2011-07-22 MED ORDER — FUROSEMIDE 20 MG PO TABS: 20.0000 mg | ORAL_TABLET | Freq: Two times a day (BID) | ORAL | Status: DC | PRN

## 2011-07-22 MED ORDER — POTASSIUM CHLORIDE ER 10 MEQ PO TBCR: 20.0000 meq | EXTENDED_RELEASE_TABLET | Freq: Every day | ORAL | Status: DC

## 2011-07-22 NOTE — Assessment & Plan Note (Signed)
We have counseled him on smoking. He continues to try to wean himself. He is not interested in other medications such as chantix.

## 2011-07-22 NOTE — Assessment & Plan Note (Signed)
He has followup with Dr. Park Breed for his shortness of breath and COPD. He is currently on inhalers.
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
## 2011-07-22 NOTE — Assessment & Plan Note (Signed)
We have encouraged continued exercise, careful diet management in an effort to lose weight. 

## 2011-07-22 NOTE — Assessment & Plan Note (Signed)
He has been unable to tolerate statins. We have recommended weight loss at this time. We'll discuss further medication options on his next visit.

## 2011-07-22 NOTE — Assessment & Plan Note (Signed)
His shortness of breath his secondary to underlying COPD. He continues to smoke one pack per day. Unable to exclude underlying pulmonary hypertension. We'll start low-dose diuretic cautiously with close monitoring of his weight and blood work as needed. I've asked him to contact me if he has more than 5 pound weight loss.

## 2011-07-22 NOTE — Assessment & Plan Note (Signed)
Continues to maintain normal sinus rhythm. No changes to his cardiac medications.
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
## 2011-08-01 ENCOUNTER — Encounter: Payer: Self-pay | Admitting: Cardiovascular Disease

## 2011-08-01 ENCOUNTER — Ambulatory Visit (INDEPENDENT_AMBULATORY_CARE_PROVIDER_SITE_OTHER): Payer: Medicare Other | Admitting: Cardiovascular Disease

## 2011-08-01 VITALS — BP 142/87 | HR 60 | Ht 73.0 in | Wt 297.0 lb

## 2011-08-01 DIAGNOSIS — R42 Dizziness and giddiness: Secondary | ICD-10-CM

## 2011-08-01 DIAGNOSIS — E785 Hyperlipidemia, unspecified: Secondary | ICD-10-CM

## 2011-08-01 DIAGNOSIS — J449 Chronic obstructive pulmonary disease, unspecified: Secondary | ICD-10-CM

## 2011-08-01 DIAGNOSIS — R0609 Other forms of dyspnea: Secondary | ICD-10-CM

## 2011-08-01 DIAGNOSIS — E1149 Type 2 diabetes mellitus with other diabetic neurological complication: Secondary | ICD-10-CM

## 2011-08-01 DIAGNOSIS — R0602 Shortness of breath: Secondary | ICD-10-CM

## 2011-08-01 DIAGNOSIS — I10 Essential (primary) hypertension: Secondary | ICD-10-CM

## 2011-08-01 DIAGNOSIS — I4891 Unspecified atrial fibrillation: Secondary | ICD-10-CM

## 2011-08-01 DIAGNOSIS — F17200 Nicotine dependence, unspecified, uncomplicated: Secondary | ICD-10-CM

## 2011-08-01 DIAGNOSIS — J4489 Other specified chronic obstructive pulmonary disease: Secondary | ICD-10-CM

## 2011-08-01 NOTE — Patient Instructions (Addendum)
You are doing well. No medication changes were made. Continue lasix as needed for edema and shortness of breath.  Please call us if you have new issues that need to be addressed before your next appt.  We will call you for a follow up Appt. In 6 months

## 2011-08-01 NOTE — Assessment & Plan Note (Signed)
We have encouraged continued exercise, careful diet management in an effort to lose weight. 

## 2011-08-01 NOTE — Assessment & Plan Note (Signed)
Blood pressure is adequate. We'll continue him on his current medications. Continue Lasix p.r.n. For edema and shortness of breath.

## 2011-08-01 NOTE — Assessment & Plan Note (Signed)
Shortness of breath likely from deconditioning, obesity, COPD with continued smoking, diastolic dysfunction as seen on echo earlier this year with moderate LVH.

## 2011-08-01 NOTE — Assessment & Plan Note (Signed)
His COPD is probably his major medical issue, continued smoking over many decades, wheezing on exam appears short of breath with exertion likely from deconditioning, obesity and COPD.

## 2011-08-01 NOTE — Assessment & Plan Note (Signed)
We have recommended again that he quit smoking. He continues to have chronic cough and shortness of breath with exertion. He does not take inhalers on a regular basis

## 2011-08-01 NOTE — Assessment & Plan Note (Signed)
He does not want a cholesterol pill and believes his cholesterol is adequate at this time

## 2011-08-01 NOTE — Assessment & Plan Note (Signed)
Good rhythm the EKG on today's visit. No further episodes of paroxysmal atrial fibrillation.
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
## 2011-08-05 ENCOUNTER — Other Ambulatory Visit: Payer: Self-pay | Admitting: *Deleted

## 2011-08-05 MED ORDER — HYDROCODONE-ACETAMINOPHEN 5-500 MG PO TABS: 1.0000 | ORAL_TABLET | Freq: Three times a day (TID) | ORAL | Status: DC | PRN

## 2011-08-05 NOTE — Telephone Encounter (Signed)
Please phone in

## 2011-08-05 NOTE — Telephone Encounter (Signed)
Please call [name] when Rx has been called in.

## 2011-08-06 ENCOUNTER — Encounter: Payer: Medicare Other | Admitting: Emergency Medicine

## 2011-08-06 NOTE — Telephone Encounter (Signed)
Rx called in as directed and [facility] notified.

## 2011-08-13 ENCOUNTER — Telehealth: Payer: Self-pay | Admitting: Cardiovascular Disease

## 2011-08-13 ENCOUNTER — Ambulatory Visit (INDEPENDENT_AMBULATORY_CARE_PROVIDER_SITE_OTHER): Payer: Medicare Other | Admitting: Emergency Medicine

## 2011-08-13 DIAGNOSIS — E785 Hyperlipidemia, unspecified: Secondary | ICD-10-CM

## 2011-08-13 DIAGNOSIS — H538 Other visual disturbances: Secondary | ICD-10-CM

## 2011-08-13 DIAGNOSIS — I4891 Unspecified atrial fibrillation: Secondary | ICD-10-CM

## 2011-08-13 DIAGNOSIS — R42 Dizziness and giddiness: Secondary | ICD-10-CM

## 2011-08-13 NOTE — Telephone Encounter (Signed)
Dr. Mariah Milling had said previously at last ov, if he continues to have symptoms we can order carotid. [name], would you please call pt to schedule, I have now placed order, thanks!

## 2011-08-13 NOTE — Telephone Encounter (Signed)
Pt came in for Coumadin and wanted to schedule a carotid US. Pt stated that Dr Mariah Milling suggested he have this done. I didn't see anything in the last office note for this. Can you please check on this and let pt know.

## 2011-08-26 ENCOUNTER — Encounter: Payer: Medicare Other | Admitting: *Deleted

## 2011-09-02 ENCOUNTER — Encounter: Payer: Medicare Other | Admitting: *Deleted

## 2011-09-02 ENCOUNTER — Other Ambulatory Visit: Payer: Self-pay | Admitting: *Deleted

## 2011-09-02 MED ORDER — HYDROCODONE-ACETAMINOPHEN 5-500 MG PO TABS: 1.0000 | ORAL_TABLET | Freq: Three times a day (TID) | ORAL | Status: DC | PRN

## 2011-09-02 NOTE — Telephone Encounter (Signed)
Phoned request from pt's wife, please send to [facility] [address] 

## 2011-09-02 NOTE — Telephone Encounter (Signed)
Ok to phone in.  Thanks.

## 2011-09-02 NOTE — Telephone Encounter (Signed)
Rx called in as directed.   

## 2011-09-05 ENCOUNTER — Ambulatory Visit: Payer: Medicare Other | Admitting: Family Medicine

## 2011-09-08 ENCOUNTER — Encounter: Payer: Self-pay | Admitting: Family Medicine

## 2011-09-08 ENCOUNTER — Ambulatory Visit (INDEPENDENT_AMBULATORY_CARE_PROVIDER_SITE_OTHER): Payer: Medicare Other | Admitting: Family Medicine

## 2011-09-08 DIAGNOSIS — I4891 Unspecified atrial fibrillation: Secondary | ICD-10-CM

## 2011-09-08 DIAGNOSIS — I10 Essential (primary) hypertension: Secondary | ICD-10-CM

## 2011-09-08 DIAGNOSIS — R3589 Other polyuria: Secondary | ICD-10-CM | POA: Insufficient documentation

## 2011-09-08 DIAGNOSIS — R358 Other polyuria: Secondary | ICD-10-CM

## 2011-09-08 DIAGNOSIS — J4489 Other specified chronic obstructive pulmonary disease: Secondary | ICD-10-CM

## 2011-09-08 DIAGNOSIS — E1149 Type 2 diabetes mellitus with other diabetic neurological complication: Secondary | ICD-10-CM

## 2011-09-08 DIAGNOSIS — M25561 Pain in right knee: Secondary | ICD-10-CM

## 2011-09-08 DIAGNOSIS — M25569 Pain in unspecified knee: Secondary | ICD-10-CM

## 2011-09-08 DIAGNOSIS — F17200 Nicotine dependence, unspecified, uncomplicated: Secondary | ICD-10-CM

## 2011-09-08 DIAGNOSIS — E785 Hyperlipidemia, unspecified: Secondary | ICD-10-CM

## 2011-09-08 DIAGNOSIS — R160 Hepatomegaly, not elsewhere classified: Secondary | ICD-10-CM

## 2011-09-08 DIAGNOSIS — J449 Chronic obstructive pulmonary disease, unspecified: Secondary | ICD-10-CM

## 2011-09-08 DIAGNOSIS — R252 Cramp and spasm: Secondary | ICD-10-CM

## 2011-09-08 HISTORY — PX: OTHER SURGICAL HISTORY: SHX169

## 2011-09-08 NOTE — Assessment & Plan Note (Signed)
Will need to check UA next visit. Check glu today.

## 2011-09-08 NOTE — Assessment & Plan Note (Signed)
Chronic, followed by Dr. Park Breed.   Continues to smoke, contemplative. Exp wheezing on exam. Started on daliresp recently.

## 2011-09-08 NOTE — Assessment & Plan Note (Signed)
Chronic. Stable. Continue meds. Uses lasix prn, hasn't used in last 1 1/2 wks.

## 2011-09-08 NOTE — Assessment & Plan Note (Signed)
Discussed importance of cessation. Has tried wellbutrin, chantix without success. Has not been on nicotine therapy.  Thinks will need to quit cold [place]. Discussed sandwich bag method. Pt to cut back in next 2 wks, return to discuss at f/u, consider nicotrol. Spent 5 min discussing.
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
## 2011-09-08 NOTE — Assessment & Plan Note (Signed)
Resolved

## 2011-09-08 NOTE — Assessment & Plan Note (Signed)
Longstanding for last 6-7 mo. Check blood work to evaluate reversible causes.

## 2011-09-08 NOTE — Assessment & Plan Note (Signed)
Lab Results  Component Value Date   LDLCALC 125* 02/17/2011  intolerant to statins in past. Consider pravastatin vs red yeast rice. Goal <100.

## 2011-09-08 NOTE — Assessment & Plan Note (Signed)
On coumadin.  Stable.  Today regular rhythm.

## 2011-09-08 NOTE — Assessment & Plan Note (Signed)
Diet controlled. Lab Results  Component Value Date   HGBA1C 7.3* 11/30/2010
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
## 2011-09-08 NOTE — Patient Instructions (Addendum)
You need to quit smoking! I would like to set you up with ultrasound of abdomen.  Pass by Marion's office to make appointment. Blood work today to check on muscle function and potassium. Please return in 2 weeks for follow up.

## 2011-09-09 LAB — CBC WITH DIFFERENTIAL/PLATELET
Basophils Absolute: 0 10*3/uL (ref 0.0–0.1)
Eosinophils Absolute: 0.3 10*3/uL (ref 0.0–0.7)
HCT: 49.7 % (ref 39.0–52.0)
Hemoglobin: 16.5 g/dL (ref 13.0–17.0)
Lymphs Abs: 2.3 10*3/uL (ref 0.7–4.0)
MCHC: 33.2 g/dL (ref 30.0–36.0)
Monocytes Absolute: 0.4 10*3/uL (ref 0.1–1.0)
Neutro Abs: 7.4 10*3/uL (ref 1.4–7.7)
Platelets: 232 10*3/uL (ref 150.0–400.0)
RDW: 13.5 % (ref 11.5–14.6)

## 2011-09-09 LAB — COMPREHENSIVE METABOLIC PANEL
ALT: 26 U/L (ref 0–53)
AST: 25 U/L (ref 0–37)
CO2: 28 mEq/L (ref 19–32)
Chloride: 102 mEq/L (ref 96–112)
Creatinine, Ser: 1.1 mg/dL (ref 0.4–1.5)
GFR: 70.15 mL/min (ref >60.00)
Sodium: 139 mEq/L (ref 135–145)
Total Bilirubin: 0.6 mg/dL (ref 0.3–1.2)
Total Protein: 7.1 g/dL (ref 6.0–8.3)

## 2011-09-09 LAB — CK: Total CK: 126 U/L (ref 7–232)

## 2011-09-10 ENCOUNTER — Telehealth: Payer: Self-pay | Admitting: Cardiovascular Disease

## 2011-09-10 ENCOUNTER — Encounter: Payer: Self-pay | Admitting: *Deleted

## 2011-09-10 ENCOUNTER — Ambulatory Visit (INDEPENDENT_AMBULATORY_CARE_PROVIDER_SITE_OTHER): Payer: Medicare Other | Admitting: Emergency Medicine

## 2011-09-10 ENCOUNTER — Telehealth: Payer: Self-pay | Admitting: *Deleted

## 2011-09-10 ENCOUNTER — Ambulatory Visit: Payer: Self-pay | Admitting: Family Medicine

## 2011-09-10 ENCOUNTER — Encounter: Payer: Self-pay | Admitting: Family Medicine

## 2011-09-10 DIAGNOSIS — I714 Abdominal aortic aneurysm, without rupture: Secondary | ICD-10-CM

## 2011-09-10 DIAGNOSIS — Z7901 Long term (current) use of anticoagulants: Secondary | ICD-10-CM

## 2011-09-10 DIAGNOSIS — I4891 Unspecified atrial fibrillation: Secondary | ICD-10-CM

## 2011-09-10 DIAGNOSIS — J449 Chronic obstructive pulmonary disease, unspecified: Secondary | ICD-10-CM

## 2011-09-10 DIAGNOSIS — F17200 Nicotine dependence, unspecified, uncomplicated: Secondary | ICD-10-CM

## 2011-09-10 NOTE — Telephone Encounter (Signed)
Discussed with pt and wife, [name]. Abdominal aneurysm.  Would like to set up with vascular surgery for evaluation. Have placed order in chart.

## 2011-09-10 NOTE — Telephone Encounter (Signed)
Pt calling would like to speak to nurse or Dr Mariah Milling concerning some test result that he got this morning. Pt was at [facility] for an ABN ultrasound. Preliminary results show AAA.

## 2011-09-10 NOTE — Telephone Encounter (Signed)
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  Per Dr Reece Agar, advised for pt to go home, Dr. Reece Agar will call him.

## 2011-09-10 NOTE — Telephone Encounter (Signed)
Reviewed final report - distal AAA along with bilateral iliac dilatation. fatty liver. Otherwise normal.  No fluid/ascites. Please notify ultrasound showing fatty liver (extra fat tissue in liver which I will monitor), abdominal aortic aneurysm (as we talked about), otherwise normal.  We will work on setting him up for vascular surgery consult.  Smoking does put him at risk for the aneurysm so keep working on quitting.

## 2011-09-10 NOTE — Telephone Encounter (Signed)
Pt on coumadin. Can we try and get final report?  Thanks. Called pt - unavailable.  Will call later today.

## 2011-09-11 ENCOUNTER — Telehealth: Payer: Self-pay | Admitting: *Deleted

## 2011-09-11 ENCOUNTER — Other Ambulatory Visit: Payer: Self-pay | Admitting: *Deleted

## 2011-09-11 DIAGNOSIS — F17200 Nicotine dependence, unspecified, uncomplicated: Secondary | ICD-10-CM

## 2011-09-11 DIAGNOSIS — I714 Abdominal aortic aneurysm, without rupture: Secondary | ICD-10-CM

## 2011-09-11 MED ORDER — CYCLOBENZAPRINE HCL 10 MG PO TABS: 10.0000 mg | ORAL_TABLET | Freq: Three times a day (TID) | ORAL | Status: DC | PRN

## 2011-09-11 NOTE — Telephone Encounter (Signed)
CT Angio set up at [facility] on 09/15/2011 at 8:45am. Appt with Vascular Dr Cari Caraway on 09/17/2011 at 10:30.

## 2011-09-11 NOTE — Telephone Encounter (Signed)
I spoke to pt yesterday after phone call, Dr. Mariah Milling aware of u/s and recc abdominal/pelvic CT scan with contrast and referral to vascular. I notified pt and asked where he wanted surgery (if needed) and that's where we will order the CT. Pt stated ok with [place].   I called Shirlee Limerick at [facility] this AM to see if they wanted [place] to make referral; she stated Dr. Sharen Hones already sent referral and Shirlee Limerick spoke to pt yesterday, he is to call her this AM. We will not order CT at this time since referral already being made to vascular. Shirlee Limerick will notify pt.

## 2011-09-11 NOTE — Telephone Encounter (Signed)
Patient aware.

## 2011-09-11 NOTE — Telephone Encounter (Signed)
Patient's wife called with questions about his AAA. They want to know if it is considered to be small, medium or large and the chances of rupture. I advised I couldn't tell them that, but felt confident in the fact if it was felt to be emergent, that you would've sent him straight to the hospital. I advised that when he was here the other day, he wasn't having major clinical signs of AAA which were all good things. I advised that I would ask you and one of [place] would give them a call back.

## 2011-09-11 NOTE — Telephone Encounter (Signed)
Phoned request from patient's wife.

## 2011-09-11 NOTE — Telephone Encounter (Signed)
Message left notifying patient of the size and why he was being referred. Advised the best thing to do is to quit smoking to help prevent more. The CTA has been scheduled per [facility]. Instructed to call with anymore questions or concerns.

## 2011-09-11 NOTE — Telephone Encounter (Signed)
Sent in.plz notify pt.  

## 2011-09-11 NOTE — Telephone Encounter (Signed)
Please notify it is a large size and that is why I'm referring to vascular surgery because may need surgery to repair and prevent from enlarging more.  Best thing he can do is quit smoking as that makes him more prone to getting aneurysm.  He is already on other medicines we would normally use to treat these. I would like to schedule CTA for better visualization of aneurysm, marion will be calling to schedule.

## 2011-09-12 NOTE — Telephone Encounter (Signed)
Patients wife notified

## 2011-09-14 ENCOUNTER — Encounter: Payer: Self-pay | Admitting: Family Medicine

## 2011-09-15 ENCOUNTER — Ambulatory Visit (INDEPENDENT_AMBULATORY_CARE_PROVIDER_SITE_OTHER)
Admission: RE | Admit: 2011-09-15 | Discharge: 2011-09-15 | Disposition: A | Discharge status: Home / Self Care | Payer: Medicare Other | Source: Ambulatory Visit | Attending: Family Medicine | Admitting: Family Medicine

## 2011-09-15 DIAGNOSIS — F17200 Nicotine dependence, unspecified, uncomplicated: Secondary | ICD-10-CM

## 2011-09-15 DIAGNOSIS — I714 Abdominal aortic aneurysm, without rupture: Secondary | ICD-10-CM

## 2011-09-15 MED ORDER — IOHEXOL 300 MG/ML  SOLN
100.0000 mL | Freq: Once | INTRAMUSCULAR | Status: AC | PRN
  Administered 2011-09-15: 100 mL via INTRAVENOUS

## 2011-09-16 ENCOUNTER — Encounter: Payer: Self-pay | Admitting: Vascular Surgery

## 2011-09-17 ENCOUNTER — Telehealth: Payer: Self-pay

## 2011-09-17 ENCOUNTER — Ambulatory Visit (INDEPENDENT_AMBULATORY_CARE_PROVIDER_SITE_OTHER): Payer: Medicare Other | Admitting: Vascular Surgery

## 2011-09-17 ENCOUNTER — Encounter: Payer: Self-pay | Admitting: Vascular Surgery

## 2011-09-17 VITALS — BP 101/69 | HR 74 | Resp 20 | Ht 73.0 in | Wt 297.0 lb

## 2011-09-17 DIAGNOSIS — I714 Abdominal aortic aneurysm, without rupture: Secondary | ICD-10-CM

## 2011-09-17 NOTE — Progress Notes (Signed)
[facility] Specialist of [place]  Patient name: [name] MRN: [id number] DOB: 18-Dec-1945 Sex: male  CC: type IV thoracoabdominal aneurysm  HPI: [name] is a 65 y.o. male who was referred by [facility] cardiology with a thoracoabdominal aneurysm. This patient underwent an ultrasound at [facility] to workup hepatomegaly. This showed a 6.1 cm distal abdominal aortic aneurysm. He was also was noted to have dilatation of both common iliac arteries. He subsequently had a CT scan which was performed on 09/15/2011. I have independently interpreted  this  CT scan. The aorta measures 3.1 cm at the level of the celiac axis. It dilates up to 4 cm just below the superior mesenteric artery. At the right renal artery 3.8 cm. F. Renal artery is lower and below that the does narrowed down to a reasonable sized neck although this a reversed taper to the neck he did have ectasia of both common iliac arteries.  Patient has not had any recent abdominal pain or back pain. He has significant COPD and has had no recent exacerbations of this. He does continue to smoke. He also has atrial for relation is on Coumadin for this is hypertension has been well controlled.  Past Medical History  Diagnosis Date  . COPD (chronic obstructive pulmonary disease)     mod-severe COPD/emphysema.  PFTs 12/2010.  He still smokes 1 ppd.  . Obesity   . GERD (gastroesophageal reflux disease)   . Muscle spasm     chronic  . Neuralgia     pain in hands. L>R from accident  . Osteoarthritis   . Paroxysmal atrial fibrillation     on coumadin only.  . Hyperlipidemia     myalgias with simvastatin and atorvastatin  . OSA (obstructive sleep apnea)     suspect  . T2DM (type 2 diabetes mellitus)   . Peripheral autonomic neuropathy due to diabetes mellitus   . Leg cramps     idiopathic severe  . Smoker     1ppd  . AAA (abdominal aortic aneurysm)     with thrombosis - 6 x 4.9 cm  . Fatty liver   .  Cervical neck pain with evidence of disc disease 07/2011    MRI - disk bulging and foraminal stenosis, advanced at C4/5, 5/6; rec pain management for ESI by Dr. Martha Clan   . CHF (congestive heart failure)     Family History  Problem Relation Age of Onset  . Heart disease Father   . Leukemia Father   . Coronary artery disease Father   . Melanoma Sister   . Diabetes Neg Hx   . Stroke Neg Hx   . Arthritis Mother     SOCIAL HISTORY: History  Substance Use Topics  . Smoking status: Current Everyday Smoker -- 1.0 packs/day for 52 years    Types: Cigarettes  . Smokeless tobacco: Never Used  . Alcohol Use: Yes     occasional, 1 glass margarita/wk    Allergies  Allergen Reactions  . Varenicline Tartrate     REACTION: muscle pain  . Wellbutrin (Bupropion Hcl) Other (See Comments)    Hallucinations  . Zocor (Simvastatin) Other (See Comments)    Muscle pain    Current Outpatient Prescriptions  Medication Sig Dispense Refill  . albuterol (PROAIR HFA) 108 (90 BASE) MCG/ACT inhaler Inhale 2 puffs into the lungs every 6 (six) hours as needed. For cough and wheezing and SOB.  1 Inhaler  11  . Coenzyme Q10 100  MG capsule Take 1 capsule (100 mg total) by mouth daily.  30 capsule  0  . cyclobenzaprine (FLEXERIL) 10 MG tablet Take 1 tablet (10 mg total) by mouth 3 (three) times daily as needed. Sedation precautions  60 tablet  0  . diclofenac sodium (VOLTAREN) 1 % GEL Apply 1 application topically 3 (three) times daily as needed.  100 g  0  . fish oil-omega-3 fatty acids 1000 MG capsule Take 2 g by mouth daily.        . Fluticasone-Salmeterol (ADVAIR DISKUS) 500-50 MCG/DOSE AEPB Inhale 1 puff into the lungs 2 (two) times daily.  60 each  0  . furosemide (LASIX) 20 MG tablet Take 1 tablet (20 mg total) by mouth 2 (two) times daily as needed.  60 tablet  3  . gabapentin (NEURONTIN) 600 MG tablet Take 600 mg by mouth 2 (two) times daily.        . Glucose Blood (ONETOUCH ULTRA BLUE VI) by In  Vitro route. Test blood glucose 1 time per day and as directed.       Marland Kitchen HYDROcodone-acetaminophen (VICODIN) 5-500 MG per tablet Take 1 tablet by mouth every 8 (eight) hours as needed for pain. WILL CAUSE DROWSINESS. DO NOT DRIVE WITH THIS MEDICATION.  60 tablet  0  . ipratropium-albuterol (DUONEB) 0.5-2.5 (3) MG/3ML SOLN Take 3 mLs by nebulization 2 (two) times daily as needed.  360 mL  0  . Nebulizer MISC by Does not apply route as directed.       . nitroGLYCERIN (NITROLINGUAL) 0.4 MG/SPRAY spray Place 1 spray under the tongue every 5 (five) minutes as needed.        Marland Kitchen omeprazole (PRILOSEC) 40 MG capsule Take 1 capsule (40 mg total) by mouth daily.  30 capsule  11  . potassium chloride (K-DUR) 10 MEQ tablet Take 2 tablets (20 mEq total) by mouth daily.  60 tablet  3  . roflumilast (DALIRESP) 500 MCG TABS tablet Take 500 mcg by mouth daily.        Marland Kitchen warfarin (COUMADIN) 5 MG tablet Take 1 tablet (5 mg total) by mouth daily.  30 tablet  6    REVIEW OF SYSTEMS: Arly.Keller ] denotes positive finding; [  ] denotes negative finding CARDIOVASCULAR:  [ ]  chest pain   [ ]  chest pressure   [ ]  palpitations   Arly.Keller ] orthopnea   Arly.Keller ] dyspnea on exertion   [ ]  claudication   [ ]  rest pain   [ ]  DVT   [ ]  phlebitis PULMONARY:   [ ]  productive cough   [ ]  asthma   Arly.Keller ] wheezing NEUROLOGIC:   [ ]  weakness  [ ]  paresthesias  [ ]  aphasia  [ ]  amaurosis  [ ]  dizziness HEMATOLOGIC:   [ ]  bleeding problems   [ ]  clotting disorders MUSCULOSKELETAL:  Arly.Keller ] joint pain   [ ]  joint swelling GASTROINTESTINAL: [ ]   blood in stool  [ ]   hematemesis GENITOURINARY:  [ ]   dysuria  [ ]   Hematuria [X]  frequency PSYCHIATRIC:  [ ]  history of major depression INTEGUMENTARY:  [ ]  rashes  [ ]  ulcers CONSTITUTIONAL:  [ ]  fever   [ ]  chills  PHYSICAL EXAM: Filed Vitals:   09/17/11 1116  BP: 101/69  Pulse: 74  Resp: 20  Height: 6\' 1"  (1.854 m)  Weight: 297 lb (134.718 kg)   Body mass index is 39.18 kg/(m^2). GENERAL: The patient is a  morbidly obese  male, in no acute distress. The vital signs are documented above. CARDIOVASCULAR: There is a regular rate and rhythm without significant murmur appreciated. I do not detect any carotid bruits. PULMONARY: There is good air exchange bilaterally without wheezing or rales. ABDOMEN: Soft and non-tender with normal pitched bowel sounds. I cannot palpate his abdominal aortic aneurysm. His abdomen is too large to assess his aneurysm.  Cannot palpate femoral pulses because of his size. I can't palpate the popliteal pulses and left dorsalis pedis pulse. Both feet appear adequate perfused. Is no significant lower extreme swelling. MUSCULOSKELETAL: There are no major deformities or cyanosis. NEUROLOGIC: No focal weakness or paresthesias are detected. SKIN: There are no ulcers or rashes noted. PSYCHIATRIC: The patient has a normal affect.  DATA:  I have reviewed his ultrasound shows a 6.1 cm abdominal aortic aneurysm. Based on the CT scan as I noted above this appears to be tight for thoracoabdominal aneurysm as there is ectasia at the level of the mesenteric vessels.  MEDICAL ISSUES: Based on the size of his aneurysm I believe that the risk of rupture is approximately 5-10% per year. Have discussed the importance of tobacco cessation and he understands it continued tobacco use increases his risk of aneurysm expansion. Given the size of the aneurysm and risk of rupture I think he should be considered for elective repair. Ideally would be nice to do an endovascular repair given his morbid obesity and multiple comorbidities including significant pulmonary disease. However this appears to be some reversed taper at the neck. He may not be inadequate landing site below the renal arteries. I recommended we proceed with arteriography to further evaluate the aneurysm for possible endovascular repair. It's possible that he could be considered for open repair and so below the renal arteries and not address the  ectasia at the mesenteric level however again because of his size and pulmonary status I think he would clearly be at increased risk for surgery. I have discussed indications for arteriography and the potential complications and he is agreeable to proceed.  Based on these results we'll try to determine the best way to approach his aneurysm. It may be worth considering getting a referral to Dr. Bennie Pierini in [place] to see if he might potentially have an endovascular option.  DICKSON,CHRISTOPHER S [facility] of [place] Office: [phone]

## 2011-09-17 NOTE — Telephone Encounter (Signed)
The patient needs a cardiac clearance for AAA surgery that is scheduled for Monday, Oct. 15, 2012 with [facility].  Please call or fax clearance letter to [facility] [phone] or fax number [phone].

## 2011-09-18 ENCOUNTER — Telehealth: Payer: Self-pay | Admitting: Cardiovascular Disease

## 2011-09-18 NOTE — Telephone Encounter (Signed)
Pt would like to speak to the nurse or Dr concerning a procedure that he is going to have. Pt to have an angiogram on Monday. Wants to know what the risk are on his heart.

## 2011-09-19 NOTE — Telephone Encounter (Signed)
Notified Mr. Nalepa per Dr. Mariah Milling he is acceptable risk for AAA surgery on Monday, September 22, 2011.

## 2011-09-19 NOTE — Telephone Encounter (Signed)
Notified Bonnie with [facility] patient has been cleared for surgery per Dr. Mariah Milling.

## 2011-09-20 ENCOUNTER — Telehealth: Payer: Self-pay | Admitting: Cardiovascular Disease

## 2011-09-20 NOTE — Telephone Encounter (Signed)
The patient needs a cardiac clearance for AAA surgery that is scheduled for Monday, Oct. 15, 2012 with [facility]. Please call or fax clearance letter to [facility] [phone] or fax number [phone].     He would be acceptable risk. Can we forward a message asap thx Dossie [place]

## 2011-09-22 ENCOUNTER — Ambulatory Visit (HOSPITAL_COMMUNITY)
Admission: RE | Admit: 2011-09-22 | Discharge: 2011-09-22 | Disposition: A | Discharge status: Home / Self Care | Payer: Medicare Other | Source: Ambulatory Visit | Attending: Vascular Surgery | Admitting: Vascular Surgery

## 2011-09-22 DIAGNOSIS — K551 Chronic vascular disorders of intestine: Secondary | ICD-10-CM

## 2011-09-22 DIAGNOSIS — I716 Thoracoabdominal aortic aneurysm, without rupture, unspecified: Secondary | ICD-10-CM | POA: Insufficient documentation

## 2011-09-22 LAB — POCT I-STAT, CHEM 8
BUN: 13 mg/dL (ref 6–23)
Calcium, Ion: 1.13 mmol/L (ref 1.12–1.32)
Chloride: 101 mEq/L (ref 96–112)
Creatinine, Ser: 1 mg/dL (ref 0.50–1.35)
Glucose, Bld: 249 mg/dL — ABNORMAL HIGH (ref 70–99)
Potassium: 4 mEq/L (ref 3.5–5.1)

## 2011-09-22 LAB — PROTIME-INR: INR: 1.08 (ref 0.00–1.49)

## 2011-09-22 NOTE — Telephone Encounter (Signed)
Letter has already been sent via fax by Jasmine December. Pt has been notified as well.

## 2011-09-23 ENCOUNTER — Telehealth: Payer: Self-pay | Admitting: *Deleted

## 2011-09-23 NOTE — Op Note (Signed)
NAMELESLY, Jim NO.:  [id number]  MEDICAL RECORD NO.:  [id number]  LOCATION:  [facility]                         FACILITY:  [facility]  PHYSICIAN:  Di Kindle. Edilia Bo, M.D.DATE OF BIRTH:  11-24-1946  DATE OF PROCEDURE:  09/22/2011 DATE OF DISCHARGE:                              OPERATIVE REPORT   PREOPERATIVE DIAGNOSIS:  Type 4 thoracoabdominal aneurysm.  POSTOPERATIVE DIAGNOSIS:  Type 4 thoracoabdominal aneurysm.  PROCEDURES: 1. Ultrasound-guided access to the left common femoral artery. 2. Aortogram with bilateral iliac arteriogram and bilateral lower     extremity runoff. 3. Perclose of left common femoral artery.  HISTORY:  This is a 65 year old gentleman who was found by ultrasound to have a 6.1-cm distal abdominal aortic aneurysm.  CT scan was obtained which showed a type 4 thoracoabdominal aneurysm.  The aneurysm measured approximately 4 cm to the level of the superior mesenteric artery.  We then tapered down below the left renal which was the lowest renal artery.  There was also ectasia of the bilateral common iliac arteries. He is brought in for diagnostic arteriography in order to determine candidate for stent graft.  TECHNIQUE:  The patient was taken to the operating room and received a mg of Versed and 50 mcg of fentanyl.  The left groin was prepped and draped in the usual sterile fashion.  Under ultrasound guidance and after the skin was anesthetized, the left common femoral artery was cannulated.  A guidewire introduced into the infrarenal aorta under fluoroscopic control.  Of note, this patient has morbid obesity, and I had to hub the needle in order to reach the artery.  The artery was there quite deep.  A 5-French sheath was introduced over the wire and this was sutured in place.  Next, pigtail catheter was positioned at the L1 vertebral body and flush aortogram obtained.  I then obtained an additional projection with 30 degrees of  cranial and 10 degrees of RAO to best demonstrate the neck of the aneurysm based on the CT findings. Next, the catheter was advanced and a lateral projection obtained. Next, the catheter was pulled down above the bifurcation and oblique iliac projections were obtained and bilateral lower extremity runoff films were obtained.  FINDINGS:  The ecstatic segment above the renal arteries measures approximately 4 cm in maximum diameter.  The neck tapers down below the lowest renal artery which is the left renal artery and there is an approximately 17-mm neck below the renals.  The aneurysm ends at the bifurcation, but then the iliacs were ectatic, the left iliac measuring approximately 25 mm in maximum diameter and the right iliac measuring approximately 18 mm in maximum diameter.  Bilateral common femoral, superficial femoral, and deep femoral arteries are patent and the tibial vessels are patent bilaterally with anterior tibial peroneal and posterior tibial runoff bilaterally.  CONCLUSION:  No significant occlusive disease noted.  The patient does appear to have an adequate neck for possible endovascular aneurysm repair.     Di Kindle. Edilia Bo, M.D.     CSD/MEDQ  D:  09/22/2011  T:  09/23/2011  Job:  [id number]  cc:   Antonieta Iba, MD  Electronically Signed  by Waverly Ferrari M.D. on 09/23/2011 02:49:00 PM

## 2011-09-23 NOTE — Telephone Encounter (Signed)
Clearance letter re-faxed to Drinda Butts at [facility] per her request for pt's Aortogram clearance.

## 2011-09-25 ENCOUNTER — Encounter: Payer: Medicare Other | Admitting: Vascular Surgery

## 2011-10-01 ENCOUNTER — Encounter: Payer: Medicare Other | Admitting: Emergency Medicine

## 2011-10-02 ENCOUNTER — Other Ambulatory Visit: Payer: Self-pay | Admitting: *Deleted

## 2011-10-02 NOTE — Telephone Encounter (Signed)
Phoned request from pt's wife.  Uses [facility] in [place].

## 2011-10-03 MED ORDER — HYDROCODONE-ACETAMINOPHEN 5-500 MG PO TABS: 1.0000 | ORAL_TABLET | Freq: Three times a day (TID) | ORAL | Status: DC | PRN

## 2011-10-03 NOTE — Telephone Encounter (Signed)
Rx called in as directed.   

## 2011-10-03 NOTE — Telephone Encounter (Signed)
plz phone in. 

## 2011-10-08 ENCOUNTER — Encounter: Payer: Medicare Other | Admitting: Emergency Medicine

## 2011-10-08 ENCOUNTER — Ambulatory Visit (INDEPENDENT_AMBULATORY_CARE_PROVIDER_SITE_OTHER): Payer: Medicare Other | Admitting: Emergency Medicine

## 2011-10-08 DIAGNOSIS — I4891 Unspecified atrial fibrillation: Secondary | ICD-10-CM

## 2011-10-08 DIAGNOSIS — Z7901 Long term (current) use of anticoagulants: Secondary | ICD-10-CM

## 2011-10-09 ENCOUNTER — Ambulatory Visit: Payer: Medicare Other | Admitting: Family Medicine

## 2011-10-13 ENCOUNTER — Other Ambulatory Visit: Payer: Self-pay | Admitting: *Deleted

## 2011-10-13 MED ORDER — CYCLOBENZAPRINE HCL 10 MG PO TABS: 10.0000 mg | ORAL_TABLET | Freq: Three times a day (TID) | ORAL | Status: DC | PRN

## 2011-10-13 NOTE — Telephone Encounter (Signed)
Done

## 2011-10-13 NOTE — Telephone Encounter (Signed)
Phoned request from pt's wife, please send to [facility] [address]

## 2011-10-14 ENCOUNTER — Encounter (HOSPITAL_COMMUNITY): Admission: RE | Payer: Self-pay | Source: Ambulatory Visit

## 2011-10-14 ENCOUNTER — Inpatient Hospital Stay (HOSPITAL_COMMUNITY): Admission: RE | Admit: 2011-10-14 | Payer: Medicare Other | Source: Ambulatory Visit | Admitting: Vascular Surgery

## 2011-10-14 SURGERY — ANEURYSM ABDOMINAL AORTIC REPAIR
Anesthesia: General

## 2011-10-17 ENCOUNTER — Ambulatory Visit: Payer: Medicare Other | Admitting: Family Medicine

## 2011-10-20 ENCOUNTER — Telehealth: Payer: Self-pay | Admitting: Family Medicine

## 2011-10-20 DIAGNOSIS — N529 Male erectile dysfunction, unspecified: Secondary | ICD-10-CM

## 2011-10-20 NOTE — Telephone Encounter (Signed)
Patient notified. He said he would like a urology referral to someone in [place]. I advised to expect a call from [place].

## 2011-10-20 NOTE — Telephone Encounter (Signed)
Placed order and routed to [facility].

## 2011-10-20 NOTE — Telephone Encounter (Signed)
Please notify I received request for vacuum erection device, but don't prescribe these.  If he'd like I can refer him to urology for further eval of ED.

## 2011-10-21 ENCOUNTER — Encounter: Payer: Self-pay | Admitting: Vascular Surgery

## 2011-10-22 ENCOUNTER — Ambulatory Visit (INDEPENDENT_AMBULATORY_CARE_PROVIDER_SITE_OTHER): Payer: Medicare Other | Admitting: Vascular Surgery

## 2011-10-22 ENCOUNTER — Encounter: Payer: Self-pay | Admitting: Vascular Surgery

## 2011-10-22 ENCOUNTER — Other Ambulatory Visit: Payer: Self-pay | Admitting: *Deleted

## 2011-10-22 VITALS — BP 137/83 | HR 72 | Resp 18 | Ht 73.0 in | Wt 297.0 lb

## 2011-10-22 DIAGNOSIS — I714 Abdominal aortic aneurysm, without rupture: Secondary | ICD-10-CM

## 2011-10-22 NOTE — Progress Notes (Signed)
Vascular and Vein Specialist of  History and Physical Exam  Patient name: [name] MRN: [id number] DOB: 02/28/1946 Sex: male  CC: 6.1 cm abdominal aortic aneurysm  HPI: [name] is a 65 y.o. male who I originally saw in consultation on 09/17/2011 with a type IV thoracoabdominal aneurysm. He was referred by [place] cardiology. The patient had undergone an ultrasound at [facility] for a workup of hepatomegaly. He was found have a 6.1 cm distal abdominal aortic aneurysm.Subsequently he had a CT scan performed on 09/15/2011. This showed that the aorta measured 3.1 cm level of the celiac axis. It dilated up to 4 cm just below the superior mesenteric artery. At the right renal artery was 3.8 cm. Below the left renal artery which is lower the neck of the aneurysm narrowed down to a reasonable size. There was ectasia of both common iliac arteries.  The patient has undergone preoperative cardiac evaluation by Dr.Gollan and has been cleared for surgery. He has had no recent abdominal or back pain. He does continue to smoke one pack per day of cigarettes.  Past Medical History  Diagnosis Date  . COPD (chronic obstructive pulmonary disease)     mod-severe COPD/emphysema.  PFTs 12/2010.  He still smokes 1 ppd.  . Obesity   . GERD (gastroesophageal reflux disease)   . Muscle spasm     chronic  . Neuralgia     pain in hands. L>R from accident  . Osteoarthritis   . Paroxysmal atrial fibrillation     on coumadin only.  . Hyperlipidemia     myalgias with simvastatin and atorvastatin  . OSA (obstructive sleep apnea)     suspect  . T2DM (type 2 diabetes mellitus)   . Peripheral autonomic neuropathy due to diabetes mellitus   . Leg cramps     idiopathic severe  . Smoker     1ppd  . AAA (abdominal aortic aneurysm)     with thrombosis - 6 x 4.9 cm  . Fatty liver   . Cervical neck pain with evidence of disc disease 07/2011    MRI - disk bulging and foraminal stenosis,  advanced at C4/5, 5/6; rec pain management for ESI by Dr. Krasinski   . CHF (congestive heart failure)     Family History  Problem Relation Age of Onset  . Heart disease Father   . Leukemia Father   . Coronary artery disease Father   . Melanoma Sister   . Diabetes Neg Hx   . Stroke Neg Hx   . Arthritis Mother     SOCIAL HISTORY: History  Substance Use Topics  . Smoking status: Current Everyday Smoker -- 1.0 packs/day for 52 years    Types: Cigarettes  . Smokeless tobacco: Never Used  . Alcohol Use: Yes     occasional, 1 glass margarita/wk    Allergies  Allergen Reactions  . Varenicline Tartrate     REACTION: muscle pain  . Wellbutrin (Bupropion Hcl) Other (See Comments)    Hallucinations  . Zocor (Simvastatin) Other (See Comments)    Muscle pain    Current Outpatient Prescriptions  Medication Sig Dispense Refill  . albuterol (PROAIR HFA) 108 (90 BASE) MCG/ACT inhaler Inhale 2 puffs into the lungs every 6 (six) hours as needed. For cough and wheezing and SOB.  1 Inhaler  11  . Coenzyme Q10 100 MG capsule Take 1 capsule (100 mg total) by mouth daily.  30 capsule  0  . cyclobenzaprine (FLEXERIL) 10   MG tablet Take 1 tablet (10 mg total) by mouth 3 (three) times daily as needed. Sedation precautions  60 tablet  0  . diclofenac sodium (VOLTAREN) 1 % GEL Apply 1 application topically 3 (three) times daily as needed.  100 g  0  . fish oil-omega-3 fatty acids 1000 MG capsule Take 2 g by mouth daily.        . Fluticasone-Salmeterol (ADVAIR DISKUS) 500-50 MCG/DOSE AEPB Inhale 1 puff into the lungs 2 (two) times daily.  60 each  0  . furosemide (LASIX) 20 MG tablet Take 1 tablet (20 mg total) by mouth 2 (two) times daily as needed.  60 tablet  3  . gabapentin (NEURONTIN) 600 MG tablet Take 600 mg by mouth 2 (two) times daily.        . Glucose Blood (ONETOUCH ULTRA BLUE VI) by In Vitro route. Test blood glucose 1 time per day and as directed.       . HYDROcodone-acetaminophen  (VICODIN) 5-500 MG per tablet Take 1 tablet by mouth every 8 (eight) hours as needed for pain. WILL CAUSE DROWSINESS. DO NOT DRIVE WITH THIS MEDICATION.  60 tablet  0  . ipratropium-albuterol (DUONEB) 0.5-2.5 (3) MG/3ML SOLN Take 3 mLs by nebulization 2 (two) times daily as needed.  360 mL  0  . Nebulizer MISC by Does not apply route as directed.       . nitroGLYCERIN (NITROLINGUAL) 0.4 MG/SPRAY spray Place 1 spray under the tongue every 5 (five) minutes as needed.        . omeprazole (PRILOSEC) 40 MG capsule Take 1 capsule (40 mg total) by mouth daily.  30 capsule  11  . potassium chloride (K-DUR) 10 MEQ tablet Take 2 tablets (20 mEq total) by mouth daily.  60 tablet  3  . roflumilast (DALIRESP) 500 MCG TABS tablet Take 500 mcg by mouth daily.        . warfarin (COUMADIN) 5 MG tablet Take 1 tablet (5 mg total) by mouth daily.  30 tablet  6    REVIEW OF SYSTEMS: [X ] denotes positive finding; [  ] denotes negative finding CARDIOVASCULAR:  [ ] chest pain   [ ] chest pressure   [ ] palpitations   [X ] orthopnea   [X ] dyspnea on exertion   [X ] claudication   [ ] rest pain   [ ] DVT   [ ] phlebitis PULMONARY:   [ ] productive cough   [ ] asthma   [X ] wheezing NEUROLOGIC:   [ ] weakness  [ ] paresthesias  [ ] aphasia  [ ] amaurosis  [ ] dizziness HEMATOLOGIC:   [ ] bleeding problems   [ ] clotting disorders MUSCULOSKELETAL:  [ ] joint pain   [ ] joint swelling [ ] leg swelling GASTROINTESTINAL: [ ]  blood in stool  [ ]  hematemesis GENITOURINARY:  [ ]  dysuria  [ ]  hematuria PSYCHIATRIC:  [ ] history of major depression INTEGUMENTARY:  [ ] rashes  [ ] ulcers CONSTITUTIONAL:  [ ] fever   [ ] chills  PHYSICAL EXAM: Filed Vitals:   10/22/11 1346  BP: 137/83  Pulse: 72  Resp: 18  Height: 6' 1" (1.854 m)  Weight: 297 lb (134.718 kg)  SpO2: 98%   Body mass index is 39.18 kg/(m^2). GENERAL: The patient is morbidly obese. male, in no acute distress. The vital signs are documented  above. CARDIOVASCULAR: There is a regular rate   and rhythm without significant murmur appreciated. I do not detect any carotid bruits. Cannot palpate femoral pulses because of his size. PULMONARY: There is good air exchange bilaterally without wheezing or rales. ABDOMEN: Soft and non-tender with normal pitched bowel sounds. His aneurysm cannot be palpated because of his size there MUSCULOSKELETAL: There are no major deformities or cyanosis. NEUROLOGIC: No focal weakness or paresthesias are detected. SKIN: There are no ulcers or rashes noted. PSYCHIATRIC: The patient has a normal affect.  DATA:  1. Arteriogram performed on 09/22/2011 showed no significant infrainguinal arterial occlusive disease. 2. CT scan shows a type IV thoracoabdominal aneurysm as described above.  MEDICAL ISSUES: This patient has a 6.1 cm infrarenal abdominal aortic aneurysm with a 10-15% per year risk of rupture. I have therefore recommended elective repair. He is certainly increased risk because of his obesity, cardiac disease, and pulmonary disease. Are given the risk of rupture I do think elective repair is warranted. I've reviewed his films and he does appear to be a reasonable candidate for endovascular repair of the aneurysm fortunately. Neck is slightly short. I have discussed the indications for aneurysm repair. I have explained that without repair, the risk of the aneurysm rupturing is 10-15% per year. We have discussed the advantages and disadvantages of open versus endovascular repair. The patient wishes to proceed with endovascular aneurysm repair (EVAR). I have discussed the potential complications of EVAR, including, but not limited to: bleeding, infection, arterial injury, graft migration, endoleak, renal failure, MI or other unpredictable medical problems. We have discussed the possibility of having to convert to open repair. We also discussed the need for continued lifelong follow-up after EVAR. All of the  patients questions were answered and they are agreeable to proceed with surgery.  Also discussed with him the importance of tobacco cessation but he does not feel that this is possible prior to his surgery.  DICKSON,CHRISTOPHER S [facility] [place] Office: [phone]   

## 2011-10-28 ENCOUNTER — Other Ambulatory Visit: Payer: Self-pay | Admitting: Family Medicine

## 2011-10-28 NOTE — Telephone Encounter (Signed)
Ok to refill.  (5d early but coming into holiday)

## 2011-10-28 NOTE — Telephone Encounter (Signed)
OK to refill

## 2011-10-29 ENCOUNTER — Ambulatory Visit (INDEPENDENT_AMBULATORY_CARE_PROVIDER_SITE_OTHER): Payer: Medicare Other | Admitting: Emergency Medicine

## 2011-10-29 DIAGNOSIS — Z7901 Long term (current) use of anticoagulants: Secondary | ICD-10-CM

## 2011-10-29 DIAGNOSIS — I4891 Unspecified atrial fibrillation: Secondary | ICD-10-CM

## 2011-10-29 NOTE — Telephone Encounter (Signed)
rx called into pharmacy

## 2011-10-31 ENCOUNTER — Encounter (HOSPITAL_COMMUNITY): Payer: Self-pay

## 2011-11-06 ENCOUNTER — Encounter (HOSPITAL_COMMUNITY): Payer: Self-pay | Admitting: *Deleted

## 2011-11-06 ENCOUNTER — Encounter (HOSPITAL_COMMUNITY)
Admission: RE | Admit: 2011-11-06 | Discharge: 2011-11-06 | Disposition: A | Discharge status: Home / Self Care | Payer: Medicare Other | Source: Ambulatory Visit | Attending: Vascular Surgery | Admitting: Vascular Surgery

## 2011-11-06 ENCOUNTER — Encounter (HOSPITAL_COMMUNITY)
Admission: RE | Admit: 2011-11-06 | Discharge: 2011-11-06 | Disposition: A | Discharge status: Home / Self Care | Payer: Medicare Other | Source: Ambulatory Visit | Attending: Anesthesiology | Admitting: Anesthesiology

## 2011-11-06 LAB — URINALYSIS, MICROSCOPIC ONLY
Glucose, UA: >1000 mg/dL — AB
Leukocytes, UA: NEGATIVE
Nitrite: NEGATIVE
Protein, ur: NEGATIVE mg/dL

## 2011-11-06 LAB — CBC
HCT: 48.3 % (ref 39.0–52.0)
MCH: 31.7 pg (ref 26.0–34.0)
MCHC: 35.2 g/dL (ref 30.0–36.0)
MCV: 90.1 fL (ref 78.0–100.0)
Platelets: 229 10*3/uL (ref 150–400)
RDW: 12.9 % (ref 11.5–15.5)
WBC: 9.6 10*3/uL (ref 4.0–10.5)

## 2011-11-06 LAB — BASIC METABOLIC PANEL
Calcium: 9.1 mg/dL (ref 8.4–10.5)
Creatinine, Ser: 0.97 mg/dL (ref 0.50–1.35)
GFR calc Af Amer: >90 mL/min (ref >90)
GFR calc non Af Amer: 84 mL/min — ABNORMAL LOW (ref >90)
Sodium: 137 mEq/L (ref 135–145)

## 2011-11-06 LAB — DIFFERENTIAL
Basophils Absolute: 0 10*3/uL (ref 0.0–0.1)
Basophils Relative: 0 % (ref 0–1)
Eosinophils Absolute: 0.3 10*3/uL (ref 0.0–0.7)
Eosinophils Relative: 3 % (ref 0–5)
Lymphocytes Relative: 24 % (ref 12–46)
Monocytes Absolute: 0.7 10*3/uL (ref 0.1–1.0)

## 2011-11-06 LAB — PROTIME-INR
INR: 1.81 — ABNORMAL HIGH (ref 0.00–1.49)
Prothrombin Time: 21.3 seconds — ABNORMAL HIGH (ref 11.6–15.2)

## 2011-11-06 NOTE — Progress Notes (Signed)
ECHO,EKG IN EPIC

## 2011-11-06 NOTE — Pre-Procedure Instructions (Signed)
20 [name]  11/06/2011   Your procedure is scheduled on:  11/11/11  Report to [facility] at 530 AM.  Call this number if you have problems the morning of surgery: [phone]   Remember:   Do not eat food:After Midnight.  May have clear liquids: up to 4 Hours before arrival.  Clear liquids include soda, tea, black coffee, apple or grape juice, broth.  Take these medicines the morning of surgery with A SIP OF WATER: ALL INHALERS,NEUROTIN,VICODAN,PRIOLSEC,   Do not wear jewelry, make-up or nail polish.  Do not wear lotions, powders, or perfumes. You may wear deodorant.  Do not shave 48 hours prior to surgery.  Do not bring valuables to the hospital.  Contacts, dentures or bridgework may not be worn into surgery.  Leave suitcase in the car. After surgery it may be brought to your room.  For patients admitted to the hospital, checkout time is 11:00 AM the day of discharge.   Patients discharged the day of surgery will not be allowed to drive home.  Name and phone number of your driver: FAMILY  Special Instructions: CHG Shower Use Special Wash: 1/2 bottle night before surgery and 1/2 bottle morning of surgery.   Please read over the following fact sheets that you were given: Pain Booklet, Coughing and Deep Breathing, MRSA Information and Surgical Site Infection Prevention

## 2011-11-06 NOTE — Progress Notes (Signed)
HX a.f.

## 2011-11-07 ENCOUNTER — Other Ambulatory Visit: Payer: Self-pay | Admitting: *Deleted

## 2011-11-07 LAB — HEPATIC FUNCTION PANEL
ALT: 27 U/L (ref 0–53)
Alkaline Phosphatase: 154 U/L — ABNORMAL HIGH (ref 39–117)
Indirect Bilirubin: 0.2 mg/dL — ABNORMAL LOW (ref 0.3–0.9)
Total Bilirubin: 0.3 mg/dL (ref 0.3–1.2)
Total Protein: 6.7 g/dL (ref 6.0–8.3)

## 2011-11-07 NOTE — Consult Note (Addendum)
Anesthesia:  Patient is a 65 year old male for EVAR on 11/11/11.  History is significant for COPD with ongoing tobacco abuse, paroxysmal afib on Coumadin, DM2, GERD, hyperlipidemia, fatty liver, obesity, and suspected OSA (per Dr. Windell Hummingbird note).  He had a c-spine CT on 05/29/11 showing no evidence of fracture or listhesis, but multi-level DJD.  His Pulmonologist is Dr. Darletta Moll at [facility] [phone]).   His last office note and PFTs are in his chart.  Spirometry findings were consistent with "moderate to severe obstruction with questionable bronchodilator response." His CXR from 11/06/11 showed COPD,  RLL ATX/scarring.  His sats were 94%.  His Cardiologist is Dr. Mariah Milling who did clear him for this procedure.  He had a stress test done on 12/20/10 (under Encounter or Notes tab) which showed small fixed apical perfusion defect with normal wall motion, apical thinning suspected.  There was no ischemia or infarction.  An echocardiogram done on 01/01/11 showed EF 55-65%, grade 1 diastolic dysfunction, LV systolic function was normal, LA mildly dilated.  An 08/01/11 EKG showed SB.  I was asked to review his preoperative labs.  His INR is still elevated, but he just stopped his Coumadin on 11/05/11.  I will get a repeat PT/PTT DOS.  He will also needs a T&S as this is a Vascular procedure.  No liver function studies were done at PAT, so I've ordered this as an add-on since he has a hx of fatty liver disease.  I have noted his LFT results.

## 2011-11-07 NOTE — Progress Notes (Signed)
Revonda Standard Zelenick PA please review labs and does pt. Need type/screen dos?

## 2011-11-10 MED ORDER — DEXTROSE 5 % IV SOLN
1.5000 g | Freq: Once | INTRAVENOUS | Status: AC
  Administered 2011-11-11: 1.5 g via INTRAVENOUS
  Filled 2011-11-10: qty 1.5

## 2011-11-11 ENCOUNTER — Inpatient Hospital Stay (HOSPITAL_COMMUNITY): Payer: Medicare Other

## 2011-11-11 ENCOUNTER — Encounter (HOSPITAL_COMMUNITY)
Admission: RE | Disposition: A | Discharge status: Home / Self Care | Payer: Self-pay | Source: Ambulatory Visit | Attending: Vascular Surgery

## 2011-11-11 ENCOUNTER — Inpatient Hospital Stay (HOSPITAL_COMMUNITY)
Admission: RE | Admit: 2011-11-11 | Discharge: 2011-11-12 | DRG: 238 | Disposition: A | Discharge status: Home / Self Care | Payer: Medicare Other | Source: Ambulatory Visit | Attending: Vascular Surgery | Admitting: Vascular Surgery

## 2011-11-11 ENCOUNTER — Encounter (HOSPITAL_COMMUNITY): Payer: Self-pay | Admitting: Vascular Surgery

## 2011-11-11 ENCOUNTER — Inpatient Hospital Stay (HOSPITAL_COMMUNITY): Payer: Medicare Other | Admitting: Vascular Surgery

## 2011-11-11 ENCOUNTER — Encounter (HOSPITAL_COMMUNITY): Payer: Self-pay | Admitting: *Deleted

## 2011-11-11 DIAGNOSIS — I714 Abdominal aortic aneurysm, without rupture, unspecified: Principal | ICD-10-CM | POA: Diagnosis present

## 2011-11-11 DIAGNOSIS — F17200 Nicotine dependence, unspecified, uncomplicated: Secondary | ICD-10-CM | POA: Diagnosis present

## 2011-11-11 DIAGNOSIS — I509 Heart failure, unspecified: Secondary | ICD-10-CM | POA: Diagnosis present

## 2011-11-11 DIAGNOSIS — M1990 Unspecified osteoarthritis, unspecified site: Secondary | ICD-10-CM | POA: Diagnosis present

## 2011-11-11 DIAGNOSIS — Z01812 Encounter for preprocedural laboratory examination: Secondary | ICD-10-CM

## 2011-11-11 DIAGNOSIS — E119 Type 2 diabetes mellitus without complications: Secondary | ICD-10-CM | POA: Diagnosis present

## 2011-11-11 DIAGNOSIS — J4489 Other specified chronic obstructive pulmonary disease: Secondary | ICD-10-CM | POA: Diagnosis present

## 2011-11-11 DIAGNOSIS — I4891 Unspecified atrial fibrillation: Secondary | ICD-10-CM | POA: Diagnosis present

## 2011-11-11 DIAGNOSIS — K219 Gastro-esophageal reflux disease without esophagitis: Secondary | ICD-10-CM | POA: Diagnosis present

## 2011-11-11 DIAGNOSIS — E785 Hyperlipidemia, unspecified: Secondary | ICD-10-CM | POA: Diagnosis present

## 2011-11-11 DIAGNOSIS — J449 Chronic obstructive pulmonary disease, unspecified: Secondary | ICD-10-CM | POA: Diagnosis present

## 2011-11-11 HISTORY — PX: ENDOVASCULAR STENT INSERTION: SHX5161

## 2011-11-11 HISTORY — DX: Depression, unspecified: F32.A

## 2011-11-11 HISTORY — DX: Major depressive disorder, single episode, unspecified: F32.9

## 2011-11-11 LAB — CBC
HCT: 44.9 % (ref 39.0–52.0)
MCH: 30.4 pg (ref 26.0–34.0)
MCHC: 33.9 g/dL (ref 30.0–36.0)
MCV: 89.8 fL (ref 78.0–100.0)
RDW: 12.9 % (ref 11.5–15.5)
WBC: 11.3 10*3/uL — ABNORMAL HIGH (ref 4.0–10.5)

## 2011-11-11 LAB — BASIC METABOLIC PANEL
BUN: 15 mg/dL (ref 6–23)
Calcium: 8.3 mg/dL — ABNORMAL LOW (ref 8.4–10.5)
Chloride: 101 mEq/L (ref 96–112)
Creatinine, Ser: 0.79 mg/dL (ref 0.50–1.35)
GFR calc Af Amer: >90 mL/min (ref >90)

## 2011-11-11 LAB — GLUCOSE, CAPILLARY: Glucose-Capillary: 299 mg/dL — ABNORMAL HIGH (ref 70–99)

## 2011-11-11 LAB — ABO/RH: ABO/RH(D): O POS

## 2011-11-11 LAB — MAGNESIUM: Magnesium: 1.7 mg/dL (ref 1.5–2.5)

## 2011-11-11 LAB — TYPE AND SCREEN: ABO/RH(D): O POS

## 2011-11-11 LAB — APTT: aPTT: 30 seconds (ref 24–37)

## 2011-11-11 LAB — PROTIME-INR: Prothrombin Time: 14.8 seconds (ref 11.6–15.2)

## 2011-11-11 SURGERY — ENDOVASCULAR STENT GRAFT INSERTION
Anesthesia: General | Site: Groin | Wound class: Clean

## 2011-11-11 MED ORDER — SODIUM CHLORIDE 0.9 % IR SOLN
Status: DC | PRN
  Administered 2011-11-11: 08:00:00

## 2011-11-11 MED ORDER — INSULIN ASPART 100 UNIT/ML ~~LOC~~ SOLN
SUBCUTANEOUS | Status: DC | PRN
  Administered 2011-11-11: 8 [IU] via SUBCUTANEOUS

## 2011-11-11 MED ORDER — FENTANYL CITRATE 0.05 MG/ML IJ SOLN
INTRAMUSCULAR | Status: DC | PRN
  Administered 2011-11-11 (×3): 50 ug via INTRAVENOUS
  Administered 2011-11-11: 150 ug via INTRAVENOUS
  Administered 2011-11-11: 50 ug via INTRAVENOUS

## 2011-11-11 MED ORDER — VECURONIUM BROMIDE 10 MG IV SOLR
INTRAVENOUS | Status: DC | PRN
  Administered 2011-11-11 (×3): 2 mg via INTRAVENOUS

## 2011-11-11 MED ORDER — POTASSIUM CHLORIDE IN NACL 20-0.9 MEQ/L-% IV SOLN
INTRAVENOUS | Status: DC
  Administered 2011-11-11: 125 mL via INTRAVENOUS
  Administered 2011-11-12: 04:00:00 via INTRAVENOUS
  Filled 2011-11-11 (×4): qty 1000

## 2011-11-11 MED ORDER — ONDANSETRON HCL 4 MG/2ML IJ SOLN
INTRAMUSCULAR | Status: DC | PRN
  Administered 2011-11-11: 4 mg via INTRAVENOUS

## 2011-11-11 MED ORDER — SUCCINYLCHOLINE CHLORIDE 20 MG/ML IJ SOLN
INTRAMUSCULAR | Status: DC | PRN
  Administered 2011-11-11: 160 mg via INTRAVENOUS

## 2011-11-11 MED ORDER — ONDANSETRON HCL 4 MG/2ML IJ SOLN: 4.0000 mg | Freq: Four times a day (QID) | INTRAMUSCULAR | Status: DC | PRN

## 2011-11-11 MED ORDER — ALBUTEROL SULFATE HFA 108 (90 BASE) MCG/ACT IN AERS
INHALATION_SPRAY | RESPIRATORY_TRACT | Status: DC | PRN
  Administered 2011-11-11: 4 via RESPIRATORY_TRACT

## 2011-11-11 MED ORDER — SODIUM CHLORIDE 0.9 % IV SOLN: 500.0000 mL | Freq: Once | INTRAVENOUS | Status: AC | PRN

## 2011-11-11 MED ORDER — INSULIN ASPART 100 UNIT/ML ~~LOC~~ SOLN
0.0000 [IU] | Freq: Three times a day (TID) | SUBCUTANEOUS | Status: DC
  Administered 2011-11-11: 8 [IU] via SUBCUTANEOUS
  Administered 2011-11-11 – 2011-11-12 (×2): 3 [IU] via SUBCUTANEOUS
  Filled 2011-11-11: qty 3

## 2011-11-11 MED ORDER — POTASSIUM CHLORIDE 10 MEQ PO TBCR
20.0000 meq | EXTENDED_RELEASE_TABLET | Freq: Two times a day (BID) | ORAL | Status: DC
  Administered 2011-11-11 – 2011-11-12 (×2): 20 meq via ORAL
  Filled 2011-11-11 (×3): qty 2

## 2011-11-11 MED ORDER — MORPHINE SULFATE 2 MG/ML IJ SOLN: 2.0000 mg | INTRAMUSCULAR | Status: DC | PRN

## 2011-11-11 MED ORDER — DEXTROSE 5 % IV SOLN
1.5000 g | Freq: Two times a day (BID) | INTRAVENOUS | Status: AC
  Administered 2011-11-11 – 2011-11-12 (×2): 1.5 g via INTRAVENOUS
  Filled 2011-11-11 (×2): qty 1.5

## 2011-11-11 MED ORDER — IPRATROPIUM-ALBUTEROL 0.5-2.5 (3) MG/3ML IN SOLN: 3.0000 mL | Freq: Two times a day (BID) | RESPIRATORY_TRACT | Status: DC | PRN

## 2011-11-11 MED ORDER — FAMOTIDINE IN NACL 20-0.9 MG/50ML-% IV SOLN
20.0000 mg | Freq: Two times a day (BID) | INTRAVENOUS | Status: DC
  Administered 2011-11-11: 20 mg via INTRAVENOUS
  Filled 2011-11-11 (×3): qty 50

## 2011-11-11 MED ORDER — ALBUTEROL SULFATE HFA 108 (90 BASE) MCG/ACT IN AERS
2.0000 | INHALATION_SPRAY | Freq: Four times a day (QID) | RESPIRATORY_TRACT | Status: DC | PRN
  Filled 2011-11-11: qty 6.7

## 2011-11-11 MED ORDER — HYDROCODONE-ACETAMINOPHEN 5-325 MG PO TABS
1.0000 | ORAL_TABLET | ORAL | Status: DC | PRN
  Administered 2011-11-11 – 2011-11-12 (×4): 2 via ORAL
  Filled 2011-11-11 (×4): qty 2

## 2011-11-11 MED ORDER — IODIXANOL 320 MG/ML IV SOLN
INTRAVENOUS | Status: DC | PRN
  Administered 2011-11-11: 93 mL via INTRAVENOUS

## 2011-11-11 MED ORDER — PROMETHAZINE HCL 25 MG/ML IJ SOLN: 6.2500 mg | INTRAMUSCULAR | Status: DC | PRN

## 2011-11-11 MED ORDER — LACTATED RINGERS IV SOLN
INTRAVENOUS | Status: DC | PRN
  Administered 2011-11-11 (×2): via INTRAVENOUS

## 2011-11-11 MED ORDER — ROCURONIUM BROMIDE 100 MG/10ML IV SOLN
INTRAVENOUS | Status: DC | PRN
  Administered 2011-11-11: 50 mg via INTRAVENOUS

## 2011-11-11 MED ORDER — CYCLOBENZAPRINE HCL 10 MG PO TABS: 10.0000 mg | ORAL_TABLET | Freq: Three times a day (TID) | ORAL | Status: DC | PRN

## 2011-11-11 MED ORDER — PROTAMINE SULFATE 10 MG/ML IV SOLN
INTRAVENOUS | Status: DC | PRN
  Administered 2011-11-11 (×2): 10 mg via INTRAVENOUS
  Administered 2011-11-11: 20 mg via INTRAVENOUS

## 2011-11-11 MED ORDER — ACETAMINOPHEN 650 MG RE SUPP: 325.0000 mg | RECTAL | Status: DC | PRN

## 2011-11-11 MED ORDER — GUAIFENESIN-DM 100-10 MG/5ML PO SYRP: 15.0000 mL | ORAL_SOLUTION | ORAL | Status: DC | PRN

## 2011-11-11 MED ORDER — WARFARIN SODIUM 5 MG PO TABS
5.0000 mg | ORAL_TABLET | Freq: Every day | ORAL | Status: DC
  Filled 2011-11-11: qty 1

## 2011-11-11 MED ORDER — NEOSTIGMINE METHYLSULFATE 1 MG/ML IJ SOLN
INTRAMUSCULAR | Status: DC | PRN
  Administered 2011-11-11: 5 mg via INTRAVENOUS

## 2011-11-11 MED ORDER — PHENOL 1.4 % MT LIQD: 1.0000 | OROMUCOSAL | Status: DC | PRN

## 2011-11-11 MED ORDER — SODIUM CHLORIDE 0.9 % IV SOLN
INTRAVENOUS | Status: DC
  Administered 2011-11-11: 10:00:00 via INTRAVENOUS

## 2011-11-11 MED ORDER — ACETAMINOPHEN 325 MG PO TABS: 325.0000 mg | ORAL_TABLET | ORAL | Status: DC | PRN

## 2011-11-11 MED ORDER — MAGNESIUM SULFATE 40 MG/ML IJ SOLN
2.0000 g | Freq: Once | INTRAMUSCULAR | Status: AC | PRN
  Filled 2011-11-11: qty 50

## 2011-11-11 MED ORDER — IPRATROPIUM BROMIDE 0.02 % IN SOLN: 0.5000 mg | Freq: Two times a day (BID) | RESPIRATORY_TRACT | Status: DC | PRN

## 2011-11-11 MED ORDER — PROPOFOL 10 MG/ML IV EMUL
INTRAVENOUS | Status: DC | PRN
  Administered 2011-11-11: 60 mg via INTRAVENOUS
  Administered 2011-11-11: 190 mg via INTRAVENOUS

## 2011-11-11 MED ORDER — PANTOPRAZOLE SODIUM 40 MG PO TBEC
40.0000 mg | DELAYED_RELEASE_TABLET | Freq: Every day | ORAL | Status: DC
  Administered 2011-11-11: 40 mg via ORAL
  Filled 2011-11-11: qty 1

## 2011-11-11 MED ORDER — NITROGLYCERIN 0.4 MG/SPRAY TL SOLN: 1.0000 | Status: DC | PRN

## 2011-11-11 MED ORDER — POTASSIUM CHLORIDE CRYS ER 20 MEQ PO TBCR: 20.0000 meq | EXTENDED_RELEASE_TABLET | Freq: Once | ORAL | Status: AC | PRN

## 2011-11-11 MED ORDER — PHENYLEPHRINE HCL 10 MG/ML IJ SOLN
10.0000 mg | INTRAVENOUS | Status: DC | PRN
  Administered 2011-11-11: 20 ug/min via INTRAVENOUS

## 2011-11-11 MED ORDER — METOPROLOL TARTRATE 1 MG/ML IV SOLN: 2.0000 mg | INTRAVENOUS | Status: DC | PRN

## 2011-11-11 MED ORDER — SODIUM CHLORIDE 0.9 % IR SOLN
Status: DC | PRN
  Administered 2011-11-11: 1000 mL

## 2011-11-11 MED ORDER — HEPARIN SODIUM (PORCINE) 1000 UNIT/ML IJ SOLN
INTRAMUSCULAR | Status: DC | PRN
  Administered 2011-11-11: 10000 [IU] via INTRAVENOUS

## 2011-11-11 MED ORDER — ALBUTEROL SULFATE (5 MG/ML) 0.5% IN NEBU: 2.5000 mg | INHALATION_SOLUTION | Freq: Two times a day (BID) | RESPIRATORY_TRACT | Status: DC | PRN

## 2011-11-11 MED ORDER — MIDAZOLAM HCL 5 MG/5ML IJ SOLN
INTRAMUSCULAR | Status: DC | PRN
  Administered 2011-11-11: 2 mg via INTRAVENOUS

## 2011-11-11 MED ORDER — HYDRALAZINE HCL 20 MG/ML IJ SOLN
10.0000 mg | INTRAMUSCULAR | Status: DC | PRN
  Filled 2011-11-11: qty 0.5

## 2011-11-11 MED ORDER — GABAPENTIN 600 MG PO TABS
600.0000 mg | ORAL_TABLET | Freq: Two times a day (BID) | ORAL | Status: DC
  Administered 2011-11-11 – 2011-11-12 (×2): 600 mg via ORAL
  Filled 2011-11-11 (×3): qty 1

## 2011-11-11 MED ORDER — FLUTICASONE-SALMETEROL 500-50 MCG/DOSE IN AEPB
1.0000 | INHALATION_SPRAY | Freq: Two times a day (BID) | RESPIRATORY_TRACT | Status: DC
  Administered 2011-11-12: 1 via RESPIRATORY_TRACT
  Filled 2011-11-11: qty 14

## 2011-11-11 MED ORDER — ALUM & MAG HYDROXIDE-SIMETH 400-400-40 MG/5ML PO SUSP
15.0000 mL | ORAL | Status: DC | PRN
  Filled 2011-11-11: qty 30

## 2011-11-11 MED ORDER — HYDROMORPHONE HCL PF 1 MG/ML IJ SOLN: 0.2500 mg | INTRAMUSCULAR | Status: DC | PRN

## 2011-11-11 MED ORDER — GLYCOPYRROLATE 0.2 MG/ML IJ SOLN
INTRAMUSCULAR | Status: DC | PRN
  Administered 2011-11-11: .9 mg via INTRAVENOUS

## 2011-11-11 MED ORDER — DOCUSATE SODIUM 100 MG PO CAPS
100.0000 mg | ORAL_CAPSULE | Freq: Every day | ORAL | Status: DC
  Administered 2011-11-12: 100 mg via ORAL
  Filled 2011-11-11: qty 1

## 2011-11-11 MED ORDER — LABETALOL HCL 5 MG/ML IV SOLN: 10.0000 mg | INTRAVENOUS | Status: DC | PRN

## 2011-11-11 MED ORDER — MEPERIDINE HCL 25 MG/ML IJ SOLN: 6.2500 mg | INTRAMUSCULAR | Status: DC | PRN

## 2011-11-11 MED ORDER — DOPAMINE-DEXTROSE 3.2-5 MG/ML-% IV SOLN: 3.0000 ug/kg/min | INTRAVENOUS | Status: DC

## 2011-11-11 SURGICAL SUPPLY — 65 items
BAG DECANTER FOR FLEXI CONT (MISCELLANEOUS) ×2 IMPLANT
BALLN CODA OCL 2-9.0-35-120-3 (BALLOONS)
BALLOON COD OCL 2-9.0-35-120-3 (BALLOONS) IMPLANT
CANISTER SUCTION 2500CC (MISCELLANEOUS) ×2 IMPLANT
CATH BEACON 5.038 65CM KMP-01 (CATHETERS) ×2 IMPLANT
CATH OMNI FLUSH .035X70CM (CATHETERS) ×2 IMPLANT
CATH VANSCH 5FR 6CM (CATHETERS) ×2 IMPLANT
CLIP TI MEDIUM 24 (CLIP) ×2 IMPLANT
CLIP TI WIDE RED SMALL 24 (CLIP) ×2 IMPLANT
CLOTH BEACON ORANGE TIMEOUT ST (SAFETY) ×2 IMPLANT
COVER MAYO STAND STRL (DRAPES) ×2 IMPLANT
COVER SURGICAL LIGHT HANDLE (MISCELLANEOUS) ×4 IMPLANT
DERMABOND ADVANCED (GAUZE/BANDAGES/DRESSINGS) ×1
DERMABOND ADVANCED .7 DNX12 (GAUZE/BANDAGES/DRESSINGS) ×1 IMPLANT
DEVICE TORQUE 50000 (MISCELLANEOUS) ×2 IMPLANT
DRAIN CHANNEL 10F 3/8 F FF (DRAIN) IMPLANT
DRAIN CHANNEL 10M FLAT 3/4 FLT (DRAIN) IMPLANT
DRAPE C-ARM 42X72 X-RAY (DRAPES) ×2 IMPLANT
DRAPE TABLE COVER HEAVY DUTY (DRAPES) ×2 IMPLANT
ELECT CAUTERY BLADE 6.4 (BLADE) ×2 IMPLANT
ELECT REM PT RETURN 9FT ADLT (ELECTROSURGICAL) ×4
ELECTRODE REM PT RTRN 9FT ADLT (ELECTROSURGICAL) ×2 IMPLANT
EVACUATOR 3/16  PVC DRAIN (DRAIN)
EVACUATOR 3/16 PVC DRAIN (DRAIN) IMPLANT
EVACUATOR SILICONE 100CC (DRAIN) IMPLANT
EXCLUDER 23MMX12CM (Endovascular Graft) ×2 IMPLANT
EXCLUDER 27MMX10CM (Endovascular Graft) ×2 IMPLANT
EXCLUDER TRUNK (Endovascular Graft) ×2 IMPLANT
GLOVE BIO SURGEON STRL SZ7.5 (GLOVE) ×4 IMPLANT
GLOVE BIOGEL PI IND STRL 7.5 (GLOVE) ×2 IMPLANT
GLOVE BIOGEL PI INDICATOR 7.5 (GLOVE) ×2
GOWN STRL NON-REIN LRG LVL3 (GOWN DISPOSABLE) ×8 IMPLANT
GRAFT BALLN CATH 65CM (STENTS) ×1 IMPLANT
GUIDEWIRE ANGLED .035X150CM (WIRE) ×2 IMPLANT
KIT BASIN OR (CUSTOM PROCEDURE TRAY) ×2 IMPLANT
KIT ROOM TURNOVER OR (KITS) ×2 IMPLANT
MERIT ADVANCE ANGIO NEEDLE ×2 IMPLANT
NEEDLE PERC 18GX7CM (NEEDLE) ×2 IMPLANT
NS IRRIG 1000ML POUR BTL (IV SOLUTION) ×4 IMPLANT
PACK AORTA (CUSTOM PROCEDURE TRAY) ×2 IMPLANT
PAD ARMBOARD 7.5X6 YLW CONV (MISCELLANEOUS) ×4 IMPLANT
PENCIL BUTTON HOLSTER BLD 10FT (ELECTRODE) ×2 IMPLANT
SHEATH AVANTI 11CM 8FR (MISCELLANEOUS) ×2 IMPLANT
SHEATH BRITE TIP 8FR 23CM (MISCELLANEOUS) ×2 IMPLANT
SHEATH DRYSEAL GORE 20X28 (SHEATH) ×2 IMPLANT
SHEATH INTRODUCER 18X30 (VASCULAR PRODUCTS) ×1
SHEATH PERFORMER 18FRX30 (VASCULAR PRODUCTS) ×1 IMPLANT
STAPLER VISISTAT 35W (STAPLE) IMPLANT
STENT GRAFT BALLN CATH 65CM (STENTS) ×1
STOPCOCK MORSE 400PSI 3WAY (MISCELLANEOUS) ×2 IMPLANT
SUT PROLENE 5 0 C 1 24 (SUTURE) ×6 IMPLANT
SUT VIC AB 2-0 CTB1 (SUTURE) ×4 IMPLANT
SUT VIC AB 3-0 SH 27 (SUTURE) ×2
SUT VIC AB 3-0 SH 27X BRD (SUTURE) ×2 IMPLANT
SUT VICRYL 4-0 PS2 18IN ABS (SUTURE) ×8 IMPLANT
SYR 20CC LL (SYRINGE) ×4 IMPLANT
SYR 30ML LL (SYRINGE) IMPLANT
SYR MEDRAD MARK V 150ML (SYRINGE) ×2 IMPLANT
SYRINGE 10CC LL (SYRINGE) ×6 IMPLANT
TOWEL OR 17X24 6PK STRL BLUE (TOWEL DISPOSABLE) ×4 IMPLANT
TOWEL OR 17X26 10 PK STRL BLUE (TOWEL DISPOSABLE) ×2 IMPLANT
TRAY FOLEY CATH 14FRSI W/METER (CATHETERS) ×2 IMPLANT
TUBING HIGH PRESSURE 120CM (CONNECTOR) ×4 IMPLANT
WATER STERILE IRR 1000ML POUR (IV SOLUTION) ×2 IMPLANT
WIRE BENTSON .035X145CM (WIRE) ×4 IMPLANT

## 2011-11-11 NOTE — Progress Notes (Signed)
Dr. Katrinka Blazing covering for Dr. Sampson Goon informed of CBG = 258 mg/dl. Informed of  Interventions prior to. Patient is asymptomatic. No new order. Continue to monitor.
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
## 2011-11-11 NOTE — Anesthesia Postprocedure Evaluation (Signed)
  Anesthesia Post-op Note  Patient: [name]  Procedure(s) Performed:  ENDOVASCULAR STENT GRAFT INSERTION - aorta bi iliac  Patient Location: PACU  Anesthesia Type: General  Level of Consciousness: sedated  Airway and Oxygen Therapy: Patient Spontanous Breathing and Patient connected to face mask oxygen  Post-op Pain: none  Post-op Assessment: Post-op Vital signs reviewed, Patient's Cardiovascular Status Stable, Respiratory Function Stable and Patent Airway  Post-op Vital Signs: Reviewed and stable  Complications: No apparent anesthesia complications

## 2011-11-11 NOTE — Transfer of Care (Signed)
Immediate Anesthesia Transfer of Care Note  Patient: [name]  Procedure(s) Performed:  ENDOVASCULAR STENT GRAFT INSERTION - aorta bi iliac  Patient Location: PACU  Anesthesia Type: General  Level of Consciousness: sedated, patient cooperative and responds to stimulation  Airway & Oxygen Therapy: Patient Spontanous Breathing, Patient connected to face mask oxygen and nasal airway  Post-op Assessment: Report given to PACU RN, Post -op Vital signs reviewed and stable  Blood sugar elevated Dr. Sampson Goon aware, RN will recheck in 30 minutes  Post vital signs: Reviewed and stable  Complications: No apparent anesthesia complications

## 2011-11-11 NOTE — OR Nursing (Signed)
Total C-arm time: 17:15
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
## 2011-11-11 NOTE — Preoperative (Signed)
Beta Blockers   Reason not to administer Beta Blockers:Not Applicable 

## 2011-11-11 NOTE — Progress Notes (Signed)
Notified Dr. Noreene Larsson of pt's po intake this am. Stated to get pt. Ready and send him to holding. Also notified Dr. Edilia Bo of pt. Stating he may change his mind about surgery. Dr. Edilia Bo stated he will talk to pt. In holding area.

## 2011-11-11 NOTE — Op Note (Signed)
11/11/2011  PREOP DIAGNOSIS: 6.1 cm abdominal aortic aneurysm  POSTOP DIAGNOSIS: same  PROCEDURE:  1. Femoral artery exposure the left groin. 2. Selective catheter placement in infrarenal aorta. 3. Placement of modular bifurcated prosthesis with 3 components total (trunk-ipsi = 31 mm x14 mm x13 cm graft, contralateral leg = 23 mm x12 cm, iliac extension on the left = 27 mm x10 cm)  SURGEON: Di Kindle. Edilia Bo, MD, FACS  COSURGEON: Leonides Sake M.D.  ANESTHESIA: Gen.   EBL: minimal  FINDINGS: no evidence of endoleak on completion arteriogram after endovascular aneurysm repair  INDICATIONS: this is a 65 year old gentleman who I seen consultation on 09/17/2011 with a type IV thoracoabdominal aneurysm. Infrarenal component measured 6.1 cm. The patient has morbid obesity and is a poor surgical candidate for repair of the thoracoabdominal component of his aneurysm. The neck of the aneurysm tapered down below the left renal artery I felt that he was a reasonable candidate for endovascular repair of the infrarenal component of his aneurysm. His Coumadin was held preoperatively. He underwent preoperative cardiac clearance. He presents for elective repair.  TECHNIQUE: The patient was brought to the operating room after monitoring lines were placed by anesthesia. The abdomen and groins were prepped and draped in the usual sterile fashion. Given the patient's morbid obesity I elected not to do this percutaneously. Using a 2 team approach through oblique incisions above the inguinal crease the common femoral arteries were dissected free bilaterally over a length of approximately 3 cm. Vessel loops were placed proximally and distally attention was first turned to the left groin. Because of the depth of the artery and the problems we would potentially have with angulation of the 20 French dilator I placed the needle below the incision to allow a less steep angles of approach to the common femoral artery. A  guidewire was introduced without difficulty and a short 8 [place] sheath was introduced into the left common femoral artery. A similar approach was used on the right side and a long 8 [place] sheath was introduced on the right over a wire. She was then heparinized with 10,000 units of IV heparin. I then exchanged the [facility] wire for a Amplatz wire over a pigtail catheter. Pigtail catheter was then placed up the right groin for arteriogram. I exchanged the short 8 French sheath for the 20 French sheath on the left side and this was advanced up into the infrarenal aorta. The trunk ipsilateral component which was a 31 x 14 x 13 cm component was advanced to the 20 French sheath and positioned at approximately the L2 vertebral body. Aortogram was obtained to demonstrate the position of the left renal artery which was the lowest of the 2 renal arteries. We elected to cross the limbs of the contralateral gate was on the patient's left. Next the proximal aspect of the device was deployed. This was a C1 device. With deployment of the device the grafts looked down considerably of approximately 2 cm. Therefore we reconstructed and the proximal graft and this was then advanced and repositioned and then gradually deployed again. It was slightly high and he began the proximal graft was constrained and then moved slightly distally and then re\re reopened. This point we had good apposition and good flow into the left renal artery which is the lowest of the 2 renal arteries.  Next the contralateral gate was cannulated as dictated by Dr. Imogene Burn. The guidewire was then exchanged for an Amplatz wire and then the 18 [place] sheath advanced  over the wire and positioned in the contralateral gate with an overlap of 3 cm. We did obtain and retrograde iliac injection on the right to demonstrate the position of the hypogastric artery to preserve flow. Once the contralateral limb was deployed this was then removed and the sheath remained in  place. On the left side we then placed the iliac extender which was a 27 mm x 10 cm length device. Of note the contralateral leg was a 23 mm x 12 cm in length device. Iliac extension on the left was positioned after retrograde iliac arteriogram was obtained to demonstrate the position of the hypogastric artery. This was then deployed and landed just above the hypogastric. We then ballooned the proximal anastomosis in the areas of overlap using the aortic balloon. Completion arteriogram was obtained showed no evidence of endoleak.  Again using a 2 team approach the groins were closed. The sheath and wires were removed and the proximal artery clamped distal vessels controlled with loops. The arteriotomy was closed with running 5-0 Prolene suture on both sides. Condition was excellent Doppler signals bilaterally. Counterincisions were closed with 4-0 Vicryl. The groin incisions were each closed with 2 deep layers of 2-0 Vicryl dissecting this layer 3-0 Vicryl the skin closed with 4-0 subcuticular stitch. Sterile dressing was applied. The patient tolerated the procedure well transferred to recovery in stable condition all needle and sponge counts were correct.     Waverly Ferrari, MD, FACS [facility] [place]  DATE OF OPERATION: 11/11/2011 DATE OF DICTATION: 11/11/2011

## 2011-11-11 NOTE — Anesthesia Procedure Notes (Signed)
Procedure Name: Intubation Date/Time: 11/11/2011 8:05 AM Performed by: Delbert Harness Pre-anesthesia Checklist: Patient identified, Timeout performed, Emergency Drugs available, Suction available and Patient being monitored Patient Re-evaluated:Patient Re-evaluated prior to inductionOxygen Delivery Method: Circle System Utilized Preoxygenation: Pre-oxygenation with 100% oxygen Intubation Type: IV induction, Rapid sequence and Circoid Pressure applied Laryngoscope Size: Miller and 3 Grade View: Grade IV Tube type: Oral Number of attempts: 2 Airway Equipment and Method: stylet Placement Confirmation: ETT inserted through vocal cords under direct vision,  positive ETCO2 and breath sounds checked- equal and bilateral Secured at: 23 cm Tube secured with: Tape Dental Injury: Teeth and Oropharynx as per pre-operative assessment  Difficulty Due To: Difficulty was anticipated Future Recommendations: Recommend- induction with short-acting agent, and alternative techniques readily available

## 2011-11-11 NOTE — Op Note (Signed)
OPERATIVE NOTE   PROCEDURE: 1.  Right femoral artery exposure 2.  Right common femoral artery cannulation and repair 3.  Endovascular aortic repair (see Dr. Adele Dan note) 4.  Right iliac limb extension placement (see Dr. Adele Dan note) 5.  Left iliac limb extension placement (see Dr. Adele Dan note)  PRE-OPERATIVE DIAGNOSIS: abdominal aortic aneurysm  POST-OPERATIVE DIAGNOSIS: same as above   COSURGEONS: Jim Caraway, MD; Leonides Sake, MD  ANESTHESIA: general  ESTIMATED BLOOD LOSS: see Dr. Adele Dan note  FINDING(S): 1.  No endoleak at end of case 2.  Successful exclusion of abdominal aortic aneurysm  3.  Patent renal arteries  SPECIMEN(S):  none  INDICATIONS:   [name] is a 65 y.o. male who  presents with abdominal aortic aneurysm.  The patient is scheduled for elective endovascular aortic aneurysm repair.  The patient is aware the risks of endovascular aortic surgery include but are not limited to: bleeding, need for transfusion, infection, death, stroke, paralysis, wound complications, impotence, bowel ischemia, and extended ventilation.  The patient elected to proceed with the procedure.  DESCRIPTION: The bulk of this procedure will be dictated by Dr. Edilia Bo.  After full informed written consent was obtained, the patient was taken back to the operating and placed supine upon the operating table.  Prior to coming to the operating room, the patient had invasive monitoring lines placed.   Prior to induction, the patient was given IV antibiotics.   After obtaining adequate anesthesia, the patient also had a foley catheter placed for urinary monitoring.  The patient was then prepped and draped in the standard fashion for an aortic procedure.  I turned my attention to the patient's right groin.  I made a transverse incision over the common femoral artery and dissected through the subcutaneous tissue with electrocautery and blunt dissection.  This patient was extremely obese so  the incision had to be widened.  Eventually I was able to dissected down to the femoral sheath, which I opened sharply.  I then dissected out the common femoral artery.  This patient had a high bifurcation so the profunda and superficial femoral arteries were also dissected out in this process.  Due to this patient's obesity, I had to make a counterincision near the groin crease to facilitate sheath placement.  I then cannulated the common femoral artery through this counterincision.  I placed a Benson wire through the needle up into the aorta.  The needle was exchanged for a long 8-Fr sheath.    Meanwhile, Dr. Edilia Bo also dissected out his common femoral artery, cannulated this artery, and placed a short 8-Fr sheath.  The wire was exchanged out for an Amplatz wire and the sheath was exchanged out for a 20-Fr Dryseal sheath on the left side.  On my side, the pigtail catheter was placed in the suprarenal position and connected to the power injector.  The main body of the endograft was placed over the left wire and advanced into a suprarenal position.  The power injector aortogram demonstrated the position of the lowest renal artery, the left one.  The Gore C3 was deployed but completion injection demonstrates suboptimal positioning, so it was reconstrained and repositioned.  After we felt we had an adequate positioning with the left renal widely open, I pulled down the pigtail catheter and exchanged the catheter for a Kumpfe catheter.  The wire was exchanged for a Glidewire.  Using this combination, I was not able to cannulated the contralateral gate, so I exchanged the catheter for a  Vancheis catheter.  Using this combination, I was able to cannulated the contralateral gate and advance the catheter into the main body endograft.  The wire was exchanged for an Amplatz wire.   The catheter was then removed and the sheath was then exchanged for a 18-Fr sheath.  A Q50 balloon was advanced over the right wire into the  aorta.  I inflated it to profile in the contralateral gate to verify successful cannulation of the main body graft.  The balloon was used to fully appose superior aspect of the main body endograft against the aorta.  The balloon was exchanged for the pigtail catheter.  The power injection demonstrated good position of the endograft with continued left renal artery blood flow.  The catheter was then pulled down so a marker sat at the flow divided.  I pulled the sheath back inferior to the hypogastric takeoff.  A hand injection demonstrated the position of the right internal iliac artery.  Based on the measurements, a right iliac extension limb was selected.  I removed the catheter and then loaded the right iliac limb extension.  It was deployed at the flow divider with adequate overlap without any difficulties.  The stent graft delivery device was removed.  Dr. Edilia Bo then fully deployed the main body endograft.  The stent graft delivery device was removed.  A retrograde injection on the left side was completed to identify the left internal iliac artery.  A left iliac limb extension was deployed by Dr. Edilia Bo without any difficulties.  The stent graft delivery device was removed.  At this point, the endograft was reballooned gentle with the Q50 at the top, at junction points, and at the distal iliac extents.  The pigtail catheter was replaced in the suprarenal position and completion aortogram was completed, which demonstrated no endoleaks and good position of the endograft in the infrarenal position in the tapered portion of the neck.  At this point, I pulled out the right wire and clamped the proximal common femoral artery while removing the sheath.  I repaired this common femoral artery with a running stitch of 5-0 Prolene.  Prior to completing this repair, I allowed the superficial femoral artery, profunda femoral artery, and proximal common femoral artery to bleeding.  There was no clot visualized and there  was good bleeding from all vessels.  The artery was repaired in the usual fashion.  I then repaired the right groin with a double layer of 2-0 Vicryl, a double layer of 3-0 Vicryl and a running subcuticular layer of 4-0 Vicryl.  The counterincision was also repaired with a U-stitch of 4-0 Vicryl.  The skin was cleaned and dried and skin incisions were reinforced with Dermabond.  The left groin was repaired by Dr. Edilia Bo in a similar fashion.  At the end of this case, the patient had strongly palpable femoral pulses and both feet had normal perfusion.    COMPLICATIONS: none  CONDITION: stable  Leonides Sake, MD [facility] Office: [phone] Pager: [phone]  11/11/2011, 10:43 AM

## 2011-11-11 NOTE — Progress Notes (Signed)
11/11/11 2030 Called Dr. Edilia Bo night coverage. Pt coughing up blood tinged mucus. Dr. Myra Gianotti was not concerned about blood tinged mucus. Will call with changes. No new orders.  Elisha Headland RN

## 2011-11-11 NOTE — Interval H&P Note (Signed)
History and Physical Interval Note:  11/11/2011 7:20 AM  [name]  has presented today for surgery, with the diagnosis of AAA;PVD  The various methods of treatment have been discussed with the patient and family. After consideration of risks, benefits and other options for treatment, the patient has consented to: ENDOVASCULAR STENT GRAFT INSERTION as a surgical intervention .  The patients' history has been reviewed, patient examined, no change in status, stable for surgery.  I have reviewed the patients' chart and labs.  Questions were answered to the patient's satisfaction.     Evin Loiseau S

## 2011-11-12 ENCOUNTER — Other Ambulatory Visit: Payer: Self-pay | Admitting: Vascular Surgery

## 2011-11-12 DIAGNOSIS — I714 Abdominal aortic aneurysm, without rupture: Secondary | ICD-10-CM

## 2011-11-12 LAB — PROTIME-INR: Prothrombin Time: 14.4 seconds (ref 11.6–15.2)

## 2011-11-12 LAB — BASIC METABOLIC PANEL
CO2: 30 mEq/L (ref 19–32)
Calcium: 8.3 mg/dL — ABNORMAL LOW (ref 8.4–10.5)
Creatinine, Ser: 0.83 mg/dL (ref 0.50–1.35)
GFR calc non Af Amer: 89 mL/min — ABNORMAL LOW (ref >90)

## 2011-11-12 LAB — CBC
MCH: 30.7 pg (ref 26.0–34.0)
MCHC: 33.9 g/dL (ref 30.0–36.0)
MCV: 90.6 fL (ref 78.0–100.0)
Platelets: 184 10*3/uL (ref 150–400)
RBC: 4.88 MIL/uL (ref 4.22–5.81)

## 2011-11-12 LAB — GLUCOSE, CAPILLARY

## 2011-11-12 MED ORDER — SODIUM CHLORIDE 0.9 % IJ SOLN
INTRAMUSCULAR | Status: AC
Start: 2011-11-12 — End: 2011-11-12
  Administered 2011-11-12: 10 mL
  Filled 2011-11-12: qty 20

## 2011-11-12 MED ORDER — HYDROCODONE-ACETAMINOPHEN 5-325 MG PO TABS: 1.0000 | ORAL_TABLET | ORAL | Status: DC | PRN

## 2011-11-12 NOTE — Plan of Care (Signed)
Problem: Phase II Discharge Progression Outcomes Goal: O2 Saturation > or equal to 90% on room air Outcome: Adequate for Discharge Pt. On  Home o2

## 2011-11-12 NOTE — Discharge Summary (Signed)
Patient ID: [name] MRN: [id number] DOB/AGE: 01-27-1946 65 y.o.  Admit date: 11/11/2011 Discharge date: 11/12/2011  Admission Diagnosis: AAA;PVD  Discharge Diagnoses:  AAA;PVD  Secondary Diagnoses: Past Medical History  Diagnosis Date  . COPD (chronic obstructive pulmonary disease)     mod-severe COPD/emphysema.  PFTs 12/2010.  He still smokes 1 ppd.  . Obesity   . GERD (gastroesophageal reflux disease)   . Muscle spasm     chronic  . Neuralgia     pain in hands. L>R from accident  . Osteoarthritis   . Paroxysmal atrial fibrillation     on coumadin only.  . Hyperlipidemia     myalgias with simvastatin and atorvastatin  . T2DM (type 2 diabetes mellitus)   . Peripheral autonomic neuropathy due to diabetes mellitus   . Leg cramps     idiopathic severe  . Smoker     1ppd  . AAA (abdominal aortic aneurysm)     with thrombosis - 6 x 4.9 cm  . Fatty liver   . Cervical neck pain with evidence of disc disease 07/2011    MRI - disk bulging and foraminal stenosis, advanced at C4/5, 5/6; rec pain management for ESI by Dr. Martha Clan   . CHF (congestive heart failure)   . Depression     Procedures: Procedure(s): ENDOVASCULAR STENT GRAFT INSERTION  Discharged Condition: good  HPI: [name] is a 66 y.o. male who I originally saw in consultation on 09/17/2011 with a type IV thoracoabdominal aneurysm. He was referred by [facility] cardiology. The patient had undergone an ultrasound at [facility] for a workup of hepatomegaly. He was found have a 6.1 cm distal abdominal aortic aneurysm.Subsequently he had a CT scan performed on 09/15/2011. This showed that the aorta measured 3.1 cm level of the celiac axis. It dilated up to 4 cm just below the superior mesenteric artery. At the right renal artery was 3.8 cm. Below the left renal artery which is lower the neck of the aneurysm narrowed down to a reasonable size. There was ectasia of both common iliac arteries.   The  patient was felt to be a reasonable candidate for EVAR. He is brought in for elective repair.    Hospital Course:  The patient was admitted on 11/10/2010. He underwent EVAR which was uneventful. Completion x-ray showed no evidence of endoleak. He was monitored in the post anesthesia care unit where he remained hemodynamically stable. He was then transferred to 3300. Here he remained hemodynamically stable. He had palpable dorsalis pedis pulses. He was able to void without difficulty. He was ambulating and tolerating his diet. He was ready for discharge to home on postoperative day #1.  Consults:     Significant Diagnostic Studies:  @IMAGES @  CBC    Component Value Date/Time   WBC 12.0* 11/12/2011 0400   RBC 4.88 11/12/2011 0400   HGB 15.0 11/12/2011 0400   HCT 44.2 11/12/2011 0400   PLT 184 11/12/2011 0400   MCV 90.6 11/12/2011 0400   MCH 30.7 11/12/2011 0400   MCHC 33.9 11/12/2011 0400   RDW 13.0 11/12/2011 0400   LYMPHSABS 2.3 11/06/2011 1534   MONOABS 0.7 11/06/2011 1534   EOSABS 0.3 11/06/2011 1534   BASOSABS 0.0 11/06/2011 1534    BMET    Component Value Date/Time   NA 135 11/12/2011 0400   K 4.7 11/12/2011 0400   CL 100 11/12/2011 0400   CO2 30 11/12/2011 0400   GLUCOSE 244* 11/12/2011 0400  BUN 11 11/12/2011 0400   CREATININE 0.83 11/12/2011 0400   CALCIUM 8.3* 11/12/2011 0400   GFRNONAA 89* 11/12/2011 0400   GFRAA >90 11/12/2011 0400    COAG Lab Results  Component Value Date   INR 1.10 11/12/2011   INR 1.15 11/11/2011   INR 1.14 11/11/2011   No results found for this basename: PTT    Disposition: Home or Self Care  Discharge Orders    Future Appointments: Provider: Department: Dept Phone: Center:   11/26/2011 9:30 AM Lysbeth Galas, CMA [facility] [phone] [facility]     Future Orders Please Complete By Expires   Resume previous diet      Driving Restrictions      Comments:   No driving for 2 weeks   Lifting restrictions      Comments:   No lifting  for 4 weeks   Call MD for:  temperature >100.5      Call MD for:  redness, tenderness, or signs of infection (pain, swelling, bleeding, redness, odor or green/yellow discharge around incision site)      Call MD for:  severe or increased pain, loss or decreased feeling  in affected limb(s)      Increase activity slowly      Comments:   Walk with assistance use walker or cane as needed   May shower       May walk up steps        Current Discharge Medication List    CONTINUE these medications which have CHANGED   Details  HYDROcodone-acetaminophen (NORCO) 5-325 MG per tablet Take 1-2 tablets by mouth every 4 (four) hours as needed. Qty: 30 tablet, Refills: 0      CONTINUE these medications which have NOT CHANGED   Details  albuterol (PROAIR HFA) 108 (90 BASE) MCG/ACT inhaler Inhale 2 puffs into the lungs every 6 (six) hours as needed. For cough and wheezing and SOB. Qty: 1 Inhaler, Refills: 11    Coenzyme Q10 100 MG capsule Take 1 capsule (100 mg total) by mouth daily. Qty: 30 capsule, Refills: 0    cyclobenzaprine (FLEXERIL) 10 MG tablet Take 10 mg by mouth 3 (three) times daily as needed. For muscle spasm. Sedation precautions     fish oil-omega-3 fatty acids 1000 MG capsule Take 2 g by mouth daily.     Fluticasone-Salmeterol (ADVAIR DISKUS) 500-50 MCG/DOSE AEPB Inhale 1 puff into the lungs 2 (two) times daily. Qty: 60 each, Refills: 0    furosemide (LASIX) 20 MG tablet Take 20 mg by mouth as needed. For fluid.     gabapentin (NEURONTIN) 600 MG tablet Take 600 mg by mouth 2 (two) times daily.      Glucose Blood (ONETOUCH ULTRA BLUE VI) by In Vitro route. Test blood glucose 1 time per day and as directed.     HYDROcodone-acetaminophen (VICODIN) 5-500 MG per tablet TAKE 1 TABLET BY MOUTH EVERY 8 HOURS AS NEEDED FOR PAIN Qty: 60 tablet, Refills: 0    ipratropium-albuterol (DUONEB) 0.5-2.5 (3) MG/3ML SOLN Take 3 mLs by nebulization 2 (two) times daily as needed. For shortness of  breath.     Nebulizer MISC by Does not apply route as directed.     omeprazole (PRILOSEC) 40 MG capsule Take 1 capsule (40 mg total) by mouth daily. Qty: 30 capsule, Refills: 11    potassium chloride (K-DUR) 10 MEQ tablet Take 20 mEq by mouth 2 (two) times daily.      warfarin (COUMADIN) 5 MG tablet Take 5  mg by mouth daily.      diclofenac sodium (VOLTAREN) 1 % GEL Apply 1 application topically 3 (three) times daily as needed. For pain.     nitroGLYCERIN (NITROLINGUAL) 0.4 MG/SPRAY spray Place 1 spray under the tongue every 5 (five) minutes as needed. For chest pain.       Follow-up Information    Follow up with Deondrea Markos S, MD in 4 weeks. (office will call)    Contact information:   [address] [place] [address] [phone]          Signed: Chuck Hint 11/12/2011, 7:43 AM

## 2011-11-12 NOTE — Progress Notes (Signed)
Patient d/c'd home per MD order. Patient and family given d/c instructions and all questions answered, and they verbalized understanding. Patient d/c'd via wheelchair with volunteer.

## 2011-11-12 NOTE — Progress Notes (Signed)
VASCULAR PROGRESS NOTE  SUBJECTIVE: No complaints. He has been ambulating in the halls. Voiding without difficulty.  PHYSICAL EXAM: Filed Vitals:   11/11/11 2130 11/11/11 2351 11/12/11 0330 11/12/11 0347  BP:  124/70  126/85  Pulse: 73 75 61 64  Temp:  98.3 F (36.8 C)  98.2 F (36.8 C)  TempSrc:  Oral  Oral  Resp: 17 16 15 14   Height:      Weight:      SpO2: 94% 95% 95% 95%   Lungs: clear to auscultation Incisions look fine. Palpable dorsalis pedis pulses.  LABS: Lab Results  Component Value Date   WBC 12.0* 11/12/2011   HGB 15.0 11/12/2011   HCT 44.2 11/12/2011   MCV 90.6 11/12/2011   PLT 184 11/12/2011   Lab Results  Component Value Date   CREATININE 0.83 11/12/2011   Lab Results  Component Value Date   INR 1.10 11/12/2011   CBG (last 3)   Basename 11/11/11 2213 11/11/11 1727 11/11/11 1245  GLUCAP 250* 168* 258*     ASSESSMENT/PLAN: 1. 1 Day Post-Op s/p: ENDOVASCULAR STENT GRAFT INSERTION 2. Doing well status post EVAR. Plan discharge today. 3. Follow up in one month with CT scan of abdomen and pelvis.  Waverly Ferrari, MD, FACS Beeper: [phone] 11/12/2011

## 2011-11-12 NOTE — Plan of Care (Signed)
Problem: Phase II Discharge Progression Outcomes Goal: Ambulates without assistance Outcome: Adequate for Discharge Patient uses a cane/wheelchair at home

## 2011-11-13 ENCOUNTER — Encounter (HOSPITAL_COMMUNITY): Payer: Self-pay | Admitting: Vascular Surgery

## 2011-11-14 ENCOUNTER — Other Ambulatory Visit: Payer: Self-pay | Admitting: Family Medicine

## 2011-11-14 ENCOUNTER — Telehealth: Payer: Self-pay

## 2011-11-14 MED ORDER — POTASSIUM CHLORIDE ER 10 MEQ PO TBCR: 20.0000 meq | EXTENDED_RELEASE_TABLET | Freq: Two times a day (BID) | ORAL | Status: DC

## 2011-11-14 NOTE — Telephone Encounter (Signed)
Refill sent potassium

## 2011-11-14 NOTE — Telephone Encounter (Signed)
OK to refill

## 2011-11-14 NOTE — Telephone Encounter (Signed)
Done

## 2011-11-17 ENCOUNTER — Other Ambulatory Visit: Payer: Self-pay | Admitting: Family Medicine

## 2011-11-21 ENCOUNTER — Other Ambulatory Visit: Payer: Self-pay | Admitting: Cardiovascular Disease

## 2011-11-21 NOTE — Telephone Encounter (Signed)
Refill sent for warfarin  

## 2011-11-26 ENCOUNTER — Ambulatory Visit (INDEPENDENT_AMBULATORY_CARE_PROVIDER_SITE_OTHER): Payer: Medicare Other | Admitting: Emergency Medicine

## 2011-11-26 DIAGNOSIS — Z7901 Long term (current) use of anticoagulants: Secondary | ICD-10-CM

## 2011-11-26 DIAGNOSIS — I4891 Unspecified atrial fibrillation: Secondary | ICD-10-CM

## 2011-11-26 LAB — POCT INR: INR: 2.4

## 2011-11-27 ENCOUNTER — Encounter: Payer: Self-pay | Admitting: Family Medicine

## 2011-11-27 ENCOUNTER — Ambulatory Visit (INDEPENDENT_AMBULATORY_CARE_PROVIDER_SITE_OTHER): Payer: Medicare Other | Admitting: Family Medicine

## 2011-11-27 DIAGNOSIS — I10 Essential (primary) hypertension: Secondary | ICD-10-CM

## 2011-11-27 DIAGNOSIS — I714 Abdominal aortic aneurysm, without rupture, unspecified: Secondary | ICD-10-CM

## 2011-11-27 DIAGNOSIS — F17200 Nicotine dependence, unspecified, uncomplicated: Secondary | ICD-10-CM

## 2011-11-27 DIAGNOSIS — K760 Fatty (change of) liver, not elsewhere classified: Secondary | ICD-10-CM

## 2011-11-27 DIAGNOSIS — J4489 Other specified chronic obstructive pulmonary disease: Secondary | ICD-10-CM

## 2011-11-27 DIAGNOSIS — I4891 Unspecified atrial fibrillation: Secondary | ICD-10-CM

## 2011-11-27 DIAGNOSIS — K7689 Other specified diseases of liver: Secondary | ICD-10-CM

## 2011-11-27 DIAGNOSIS — E1149 Type 2 diabetes mellitus with other diabetic neurological complication: Secondary | ICD-10-CM

## 2011-11-27 DIAGNOSIS — N529 Male erectile dysfunction, unspecified: Secondary | ICD-10-CM

## 2011-11-27 DIAGNOSIS — J449 Chronic obstructive pulmonary disease, unspecified: Secondary | ICD-10-CM

## 2011-11-27 DIAGNOSIS — R252 Cramp and spasm: Secondary | ICD-10-CM

## 2011-11-27 MED ORDER — HYDROCODONE-ACETAMINOPHEN 5-500 MG PO TABS: 1.0000 | ORAL_TABLET | Freq: Two times a day (BID) | ORAL | Status: DC | PRN

## 2011-11-27 MED ORDER — IPRATROPIUM-ALBUTEROL 0.5-2.5 (3) MG/3ML IN SOLN: 3.0000 mL | Freq: Two times a day (BID) | RESPIRATORY_TRACT | Status: DC | PRN

## 2011-11-27 MED ORDER — METFORMIN HCL 500 MG PO TABS: 500.0000 mg | ORAL_TABLET | Freq: Every day | ORAL | Status: DC

## 2011-11-27 NOTE — Assessment & Plan Note (Signed)
Continue f/u with Dr. Park Breed.

## 2011-11-27 NOTE — Assessment & Plan Note (Signed)
Has f/u with urology scheduled. Interested in pump. Discussed relation of obesity and recent weight gain on enlarged fat pad and impression of shrinking genitals.  Encouraged weight loss however difficult situation currently as unable to exercise 2/2 dyspnea.

## 2011-11-27 NOTE — Patient Instructions (Signed)
I've refilled vicodin and duonebs. Start metformin at 500mg  nightly. Return to see me in 2-3 months for follow up. Good to see you today.

## 2011-11-27 NOTE — Assessment & Plan Note (Signed)
abd [place] with fatty liver 09/2011 Lab Results  Component Value Date   ALT 27 11/06/2011   AST 23 11/06/2011   ALKPHOS 154* 11/06/2011   BILITOT 0.3 11/06/2011

## 2011-11-27 NOTE — Assessment & Plan Note (Signed)
Significant improvement after starting potassium.

## 2011-11-27 NOTE — Assessment & Plan Note (Signed)
Has never been significant issue. Stable blood pressure. Lasix intermittently.

## 2011-11-27 NOTE — Assessment & Plan Note (Signed)
continue to encourage cessation.  precontemplative 

## 2011-11-27 NOTE — Assessment & Plan Note (Signed)
Lab Results  Component Value Date   HGBA1C 9.0* 09/08/2011  rec set up vision screen. Foot exam today. Start metformin at 500mg  qhs.  Discussed early GI upset effect. Continue to monitor sugars.

## 2011-11-27 NOTE — Assessment & Plan Note (Signed)
Stable. Advised f/u with [facility].

## 2011-11-27 NOTE — Progress Notes (Signed)
  Subjective:    Patient ID: [name], male    DOB: [birth year], 65 y.o.   MRN: [id number]  HPI CC: 2 mo f/u  65yo with h/o COPD, HTN, smoking, pafib on coumadin, osteoarthritis, T2DM, periph neuropathy and chronic muscle spasms/leg cramps presents for 9mo f/u.  Since last visit, had endovascular stenting of AAA.  Due for appt with Dr. Edilia Bo.  Still sore.  Worried because didn't receive abx after surgery.  No fevers/chills.  No redness or pus from incision sites.  Started on O2 at night time for mod-severe COPD by Dr. Welton Flakes.  Compliant with advair.  sleeping better at night with supplemental oxygen.  Refuses sleep study.  DM  - sugars have been running elevated - 200s.  Previously on metfromin by prior PCP but not for last several months.  Lost metformin bottle.  Had in past, tolerated well.  Would like to restart metformin.  Due for vision check.  Due for foot exam. Lab Results  Component Value Date   HGBA1C 9.0* 09/08/2011    Leg cramps improved after starting potassium.  Requests refills of meds (vicodin, duonebs and metformin).  Smoking - 1+ ppd.  Working on quitting.  Wt Readings from Last 3 Encounters:  11/27/11 300 lb 12 oz (136.419 kg)  11/11/11 309 lb 4.9 oz (140.3 kg)  11/11/11 309 lb 4.9 oz (140.3 kg)  unable to walk or significant exhertion 2/2 pain and SOB.  ED - has been on viagra in past, too expensive.  endorses trouble with erection.  Also endorsing trouble voiding 2/2 involution of penis.  Interested in pump.  Review of Systems Per HPI    Objective:   Physical Exam  Nursing note and vitals reviewed. Constitutional: He appears well-developed and well-nourished. No distress.  HENT:  Head: Normocephalic and atraumatic.  Mouth/Throat: Oropharynx is clear and moist. No oropharyngeal exudate.  Eyes: Conjunctivae and EOM are normal. Pupils are equal, round, and reactive to light.  Neck: Normal range of motion. Neck supple.  Cardiovascular: Normal rate, regular  rhythm, normal heart sounds and intact distal pulses.   No murmur heard. Pulmonary/Chest: Effort normal. No respiratory distress. He has wheezes. He has no rales.       coarse  Genitourinary: Penis normal.       Incisions bilateral groin without erythema, induration.  C/d/i. Large anterior fat pad  Musculoskeletal: He exhibits no edema.       Diabetic foot exam: Normal inspection No skin breakdown Some calluses at 1st MTP and heels Normal DP/PT pulses Normal sensation to light touch but decreased monofilament L>R Nails thickened and long  Skin: Skin is warm and dry. No rash noted.  Psychiatric: He has a normal mood and affect.      Assessment & Plan:

## 2011-11-27 NOTE — Assessment & Plan Note (Signed)
Paroxysmal. Coumadin.  followed by cards and coumadin clinic.

## 2011-12-03 ENCOUNTER — Telehealth: Payer: Self-pay | Admitting: *Deleted

## 2011-12-03 NOTE — Telephone Encounter (Signed)
Received fax from [facility] requesting signature for refill on Ipratropialb 0.53mg .  Fax in your IN box.

## 2011-12-07 NOTE — Telephone Encounter (Signed)
Signed and placed in my outbox.

## 2011-12-09 DIAGNOSIS — I714 Abdominal aortic aneurysm, without rupture, unspecified: Secondary | ICD-10-CM

## 2011-12-09 HISTORY — DX: Abdominal aortic aneurysm, without rupture, unspecified: I71.40

## 2011-12-09 HISTORY — DX: Abdominal aortic aneurysm, without rupture: I71.4

## 2011-12-11 ENCOUNTER — Other Ambulatory Visit: Payer: Self-pay | Admitting: Family Medicine

## 2011-12-12 ENCOUNTER — Telehealth: Payer: Self-pay | Admitting: *Deleted

## 2011-12-12 NOTE — Telephone Encounter (Signed)
Received paperwork authorizing inhalation drugs. It is in your IN box.

## 2011-12-12 NOTE — Telephone Encounter (Signed)
Noted. Signed and placed in my out box.

## 2011-12-22 ENCOUNTER — Other Ambulatory Visit: Payer: Self-pay | Admitting: Cardiovascular Disease

## 2011-12-23 ENCOUNTER — Telehealth: Payer: Self-pay

## 2011-12-23 MED ORDER — WARFARIN SODIUM 5 MG PO TABS: 5.0000 mg | ORAL_TABLET | Freq: Every day | ORAL | Status: DC

## 2011-12-23 NOTE — Telephone Encounter (Signed)
Refill for warfarin sent in. 

## 2011-12-24 ENCOUNTER — Ambulatory Visit (INDEPENDENT_AMBULATORY_CARE_PROVIDER_SITE_OTHER): Payer: Medicare Other | Admitting: Emergency Medicine

## 2011-12-24 DIAGNOSIS — Z7901 Long term (current) use of anticoagulants: Secondary | ICD-10-CM

## 2011-12-24 DIAGNOSIS — I4891 Unspecified atrial fibrillation: Secondary | ICD-10-CM | POA: Diagnosis not present

## 2011-12-26 DIAGNOSIS — N529 Male erectile dysfunction, unspecified: Secondary | ICD-10-CM | POA: Diagnosis not present

## 2012-01-06 ENCOUNTER — Other Ambulatory Visit: Payer: Self-pay | Admitting: Family Medicine

## 2012-01-06 NOTE — Telephone Encounter (Signed)
plz phone in. 

## 2012-01-06 NOTE — Telephone Encounter (Signed)
OK to refill

## 2012-01-07 NOTE — Telephone Encounter (Signed)
Rx called in as directed.   

## 2012-01-12 ENCOUNTER — Other Ambulatory Visit: Payer: Self-pay | Admitting: Family Medicine

## 2012-01-12 NOTE — Telephone Encounter (Signed)
Rx called in as directed.   

## 2012-01-12 NOTE — Telephone Encounter (Signed)
Ok to refill 

## 2012-01-20 ENCOUNTER — Encounter: Payer: Self-pay | Admitting: Cardiovascular Disease

## 2012-01-20 ENCOUNTER — Ambulatory Visit (INDEPENDENT_AMBULATORY_CARE_PROVIDER_SITE_OTHER): Payer: Medicare Other | Admitting: Cardiovascular Disease

## 2012-01-20 VITALS — BP 119/82 | HR 67 | Resp 16 | Ht 73.0 in | Wt 296.0 lb

## 2012-01-20 DIAGNOSIS — E785 Hyperlipidemia, unspecified: Secondary | ICD-10-CM

## 2012-01-20 DIAGNOSIS — J4489 Other specified chronic obstructive pulmonary disease: Secondary | ICD-10-CM

## 2012-01-20 DIAGNOSIS — R0609 Other forms of dyspnea: Secondary | ICD-10-CM

## 2012-01-20 DIAGNOSIS — J449 Chronic obstructive pulmonary disease, unspecified: Secondary | ICD-10-CM

## 2012-01-20 DIAGNOSIS — I4891 Unspecified atrial fibrillation: Secondary | ICD-10-CM

## 2012-01-20 DIAGNOSIS — I10 Essential (primary) hypertension: Secondary | ICD-10-CM

## 2012-01-20 DIAGNOSIS — F17200 Nicotine dependence, unspecified, uncomplicated: Secondary | ICD-10-CM | POA: Diagnosis not present

## 2012-01-20 DIAGNOSIS — E1142 Type 2 diabetes mellitus with diabetic polyneuropathy: Secondary | ICD-10-CM

## 2012-01-20 DIAGNOSIS — G4710 Hypersomnia, unspecified: Secondary | ICD-10-CM | POA: Diagnosis not present

## 2012-01-20 DIAGNOSIS — R0602 Shortness of breath: Secondary | ICD-10-CM | POA: Diagnosis not present

## 2012-01-20 DIAGNOSIS — R0989 Other specified symptoms and signs involving the circulatory and respiratory systems: Secondary | ICD-10-CM

## 2012-01-20 DIAGNOSIS — E1149 Type 2 diabetes mellitus with other diabetic neurological complication: Secondary | ICD-10-CM

## 2012-01-20 NOTE — Patient Instructions (Signed)
You are doing well. No medication changes were made.  Please call us if you have new issues that need to be addressed before your next appt.  Your physician wants you to follow-up in: 6 months.  You will receive a reminder letter in the mail two months in advance. If you don't receive a letter, please call our office to schedule the follow-up appointment.   

## 2012-01-20 NOTE — Assessment & Plan Note (Signed)
Maintaining normal sinus rhythm. No medication changes made 

## 2012-01-20 NOTE — Assessment & Plan Note (Signed)
He continues to smoke. He complains about shortness of breath. Symptoms likely secondary to deconditioning, COPD and continued smoking. Unable to exclude underlying coronary artery disease and we have offered cardiac catheterization. He did not seem interested at this time.

## 2012-01-20 NOTE — Assessment & Plan Note (Signed)
Likely multifactorial as mentioned. He has declined cardiac catheterization at this time.
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
## 2012-01-20 NOTE — Assessment & Plan Note (Signed)
He continues to have a significant problem with diet and weight. His weight is likely contributing to much of his symptoms of shortness of breath and fatigue

## 2012-01-20 NOTE — Assessment & Plan Note (Signed)
Blood pressure is well controlled on today's visit. No changes made to the medications. 

## 2012-01-20 NOTE — Assessment & Plan Note (Signed)
Ideally given that he continues to smoke, we should put his goal cholesterol less than 150, LDL less than 70. He has had problems with myalgias on previous statins.

## 2012-01-23 ENCOUNTER — Ambulatory Visit: Payer: Medicare Other | Admitting: Cardiovascular Disease

## 2012-01-28 ENCOUNTER — Ambulatory Visit (INDEPENDENT_AMBULATORY_CARE_PROVIDER_SITE_OTHER): Payer: Medicare Other

## 2012-01-28 DIAGNOSIS — Z7901 Long term (current) use of anticoagulants: Secondary | ICD-10-CM | POA: Diagnosis not present

## 2012-01-28 DIAGNOSIS — I4891 Unspecified atrial fibrillation: Secondary | ICD-10-CM | POA: Diagnosis not present

## 2012-01-28 LAB — POCT INR: INR: 2.4

## 2012-01-30 ENCOUNTER — Other Ambulatory Visit: Payer: Self-pay

## 2012-01-30 MED ORDER — HYDROCODONE-ACETAMINOPHEN 5-500 MG PO TABS: 1.0000 | ORAL_TABLET | Freq: Two times a day (BID) | ORAL | Status: DC | PRN

## 2012-01-30 NOTE — Telephone Encounter (Signed)
Received fax refill request from patients pharmacy.  Okay to refill? 

## 2012-02-02 ENCOUNTER — Other Ambulatory Visit: Payer: Self-pay | Admitting: *Deleted

## 2012-02-06 ENCOUNTER — Other Ambulatory Visit: Payer: Self-pay | Admitting: *Deleted

## 2012-02-06 DIAGNOSIS — N529 Male erectile dysfunction, unspecified: Secondary | ICD-10-CM

## 2012-02-06 HISTORY — DX: Male erectile dysfunction, unspecified: N52.9

## 2012-02-06 MED ORDER — CYCLOBENZAPRINE HCL 10 MG PO TABS: 10.0000 mg | ORAL_TABLET | Freq: Two times a day (BID) | ORAL | Status: DC | PRN

## 2012-02-06 NOTE — Telephone Encounter (Signed)
Patients wife notified

## 2012-02-06 NOTE — Telephone Encounter (Signed)
plz notify sent in. 

## 2012-02-06 NOTE — Telephone Encounter (Signed)
Received a call from Syliva Overman regarding Rx for Hydrocodone.  Rx was approved on 01/30/2012 but was never called in, Rx called to [facility] [address].  Olegario Messier notified via telephone.

## 2012-02-20 ENCOUNTER — Ambulatory Visit: Payer: Medicare Other | Admitting: Family Medicine

## 2012-02-27 ENCOUNTER — Ambulatory Visit (INDEPENDENT_AMBULATORY_CARE_PROVIDER_SITE_OTHER): Payer: Medicare Other | Admitting: Family Medicine

## 2012-02-27 ENCOUNTER — Encounter: Payer: Self-pay | Admitting: Family Medicine

## 2012-02-27 VITALS — BP 134/82 | HR 68 | Temp 97.6°F | Wt 290.5 lb

## 2012-02-27 DIAGNOSIS — I10 Essential (primary) hypertension: Secondary | ICD-10-CM

## 2012-02-27 DIAGNOSIS — M542 Cervicalgia: Secondary | ICD-10-CM

## 2012-02-27 DIAGNOSIS — F17200 Nicotine dependence, unspecified, uncomplicated: Secondary | ICD-10-CM | POA: Diagnosis not present

## 2012-02-27 DIAGNOSIS — E1142 Type 2 diabetes mellitus with diabetic polyneuropathy: Secondary | ICD-10-CM | POA: Diagnosis not present

## 2012-02-27 DIAGNOSIS — E1149 Type 2 diabetes mellitus with other diabetic neurological complication: Secondary | ICD-10-CM | POA: Diagnosis not present

## 2012-02-27 DIAGNOSIS — G8929 Other chronic pain: Secondary | ICD-10-CM

## 2012-02-27 NOTE — Assessment & Plan Note (Signed)
Stable, adequate today. Continue regimen. BP Readings from Last 3 Encounters:  02/27/12 134/82  01/20/12 119/82  11/27/11 132/80

## 2012-02-27 NOTE — Progress Notes (Signed)
  Subjective:    Patient ID: [name], male    DOB: October 11, 1946, 66 y.o.   MRN: [id number]  HPI CC: check sugar  Bad fall 2 wks ago, hit head right back of skull on leg of stool.  Happened at home.  Sat in chair which broke.  Intermittent sharp pain right posterior neck into right side of head worse with certain movements.  Lasts then residual dull pain.    Sharp pain right side of abd - worse when reaching back with arm.  Lasts , improved with pressing on side.  Continued leg cramps at night but better than prior with increased dose of gabapentin.  However worried causing dry mouth so trial off this.  Takes vicodin bid as well as flexeril bid prn.  Also uses muscle rub which helps.  Drinks 7-8 bottles of water daily 2/2 dry mouth.  DM - on metformin 500mg  qhs.  Recently sugars have been running high.  Random prior to appt 300s (after bacon, pancakes and syrup for breakfast this morning).  Fasting sugars 170-220s.  Denies burning in fingers/toes, +numbness. Lab Results  Component Value Date   HGBA1C 9.0* 09/08/2011   Lab Results  Component Value Date   CREATININE 0.83 11/12/2011   Smoking - 1ppd. "I have to quit".  Has tried wellbutrin in past as well as chantix which caused hallucinations.   Wt Readings from Last 3 Encounters:  02/27/12 290 lb 8 oz (131.77 kg)  01/20/12 296 lb (134.265 kg)  11/27/11 300 lb 12 oz (136.419 kg)    Review of Systems Per HPI    Objective:   Physical Exam  Nursing note and vitals reviewed. Constitutional: He appears well-developed and well-nourished. No distress.  HENT:  Head: Normocephalic and atraumatic.  Mouth/Throat: Oropharynx is clear and moist. No oropharyngeal exudate.  Eyes: Conjunctivae and EOM are normal. Pupils are equal, round, and reactive to light. No scleral icterus.  Neck: Normal range of motion. Neck supple.       Tender to palpation midline around C6-7 (baseline). No deformity noted today.  Cardiovascular:  Normal rate, regular rhythm, normal heart sounds and intact distal pulses.   No murmur heard. Pulses:      Dorsalis pedis pulses are 1+ on the right side, and 1+ on the left side.       Posterior tibial pulses are 1+ on the right side, and 1+ on the left side.  Pulmonary/Chest: Effort normal. No respiratory distress. He has wheezes (mild exp wheezing). He has no rales.       Coarse  Musculoskeletal: He exhibits no edema.       Cold extremities bilaterally  Lymphadenopathy:    He has no cervical adenopathy.  Skin: Skin is warm and dry. No rash noted.  Psychiatric: He has a normal mood and affect.       Assessment & Plan:

## 2012-02-27 NOTE — Assessment & Plan Note (Addendum)
Deteriorated, chronic. Slowly titrate metformin up. Discussed GI side effects. Lab Results  Component Value Date   HGBA1C 9.0* 09/08/2011  rtc 1 mo for f/u. Check nonfasting blood work today.

## 2012-02-27 NOTE — Patient Instructions (Addendum)
Blood work today.   Increase metformin to 500mg  twice daily for 2 weeks then may do 500mg  in am and 1000mg  in pm. Trial voltaren gel for neck ache Return to see me in 1 month.

## 2012-02-27 NOTE — Assessment & Plan Note (Signed)
Contemplative. Encouraged cessation.

## 2012-02-27 NOTE — Assessment & Plan Note (Signed)
Recently worse after fall. Midline neck pain at same location as pt's bulging disks. No evidence of subdural today. rec f/u with pain management for ESI, unsure if done. trial voltaren gel for pain Avoid oral NSAIDs 2/2 coumadin.  Already on muscle relaxant and vicodin.

## 2012-02-28 LAB — BASIC METABOLIC PANEL
BUN: 15 mg/dL (ref 6–23)
Creat: 1.16 mg/dL (ref 0.50–1.35)
Glucose, Bld: 292 mg/dL — ABNORMAL HIGH (ref 70–99)
Potassium: 4.7 mEq/L (ref 3.5–5.3)

## 2012-02-28 LAB — MICROALBUMIN / CREATININE URINE RATIO: Microalb, Ur: 0.56 mg/dL (ref 0.00–1.89)

## 2012-03-03 DIAGNOSIS — N529 Male erectile dysfunction, unspecified: Secondary | ICD-10-CM | POA: Diagnosis not present

## 2012-03-04 ENCOUNTER — Other Ambulatory Visit: Payer: Self-pay

## 2012-03-04 MED ORDER — HYDROCODONE-ACETAMINOPHEN 5-500 MG PO TABS: 1.0000 | ORAL_TABLET | Freq: Two times a day (BID) | ORAL | Status: DC | PRN

## 2012-03-04 NOTE — Telephone Encounter (Signed)
Rx called in as directed.   

## 2012-03-04 NOTE — Telephone Encounter (Signed)
[facility] faxed request Hydrocodone APAP 5-500 mg. Pt last seen 02/27/12.Please advise.

## 2012-03-04 NOTE — Telephone Encounter (Signed)
plz phone in. 

## 2012-03-05 ENCOUNTER — Ambulatory Visit: Payer: Self-pay | Admitting: Internal Medicine

## 2012-03-05 DIAGNOSIS — G4710 Hypersomnia, unspecified: Secondary | ICD-10-CM | POA: Diagnosis not present

## 2012-03-05 DIAGNOSIS — G4730 Sleep apnea, unspecified: Secondary | ICD-10-CM | POA: Diagnosis not present

## 2012-03-05 DIAGNOSIS — G4720 Circadian rhythm sleep disorder, unspecified type: Secondary | ICD-10-CM | POA: Diagnosis not present

## 2012-03-08 ENCOUNTER — Encounter: Payer: Self-pay | Admitting: Family Medicine

## 2012-03-08 DIAGNOSIS — G4733 Obstructive sleep apnea (adult) (pediatric): Secondary | ICD-10-CM

## 2012-03-08 HISTORY — DX: Obstructive sleep apnea (adult) (pediatric): G47.33

## 2012-03-10 ENCOUNTER — Ambulatory Visit (INDEPENDENT_AMBULATORY_CARE_PROVIDER_SITE_OTHER): Payer: Medicare Other

## 2012-03-10 DIAGNOSIS — I4891 Unspecified atrial fibrillation: Secondary | ICD-10-CM | POA: Diagnosis not present

## 2012-03-10 DIAGNOSIS — Z7901 Long term (current) use of anticoagulants: Secondary | ICD-10-CM | POA: Diagnosis not present

## 2012-03-21 ENCOUNTER — Encounter: Payer: Self-pay | Admitting: Family Medicine

## 2012-03-24 ENCOUNTER — Encounter: Payer: Self-pay | Admitting: Family Medicine

## 2012-03-25 ENCOUNTER — Telehealth: Payer: Self-pay

## 2012-03-25 NOTE — Telephone Encounter (Signed)
Charlotte Crumb left v/m for pt to verify Metformin 500 mg instructions and then need to update [employer]. Pt last seen 02/27/12 and pt instructions said Metformin 500 mg in AM and 1000 mg in PM. Pt has future appt withDr Sharen Hones 04/05/12. Left v/m for [name] to call back.

## 2012-03-26 ENCOUNTER — Ambulatory Visit: Payer: Medicare Other | Admitting: Family Medicine

## 2012-03-29 MED ORDER — METFORMIN HCL 500 MG PO TABS: ORAL_TABLET | ORAL | Status: DC

## 2012-03-29 NOTE — Telephone Encounter (Signed)
Spoke with [name] metformin 500 mg #90 x 11 to [employer]; Perry notified med sent while on phone.

## 2012-04-01 ENCOUNTER — Ambulatory Visit (INDEPENDENT_AMBULATORY_CARE_PROVIDER_SITE_OTHER): Payer: Medicare Other | Admitting: Family Medicine

## 2012-04-01 ENCOUNTER — Ambulatory Visit: Payer: Medicare Other | Admitting: Family Medicine

## 2012-04-01 ENCOUNTER — Encounter: Payer: Self-pay | Admitting: Family Medicine

## 2012-04-01 VITALS — BP 110/64 | HR 80 | Temp 97.0°F | Wt 287.8 lb

## 2012-04-01 DIAGNOSIS — L989 Disorder of the skin and subcutaneous tissue, unspecified: Secondary | ICD-10-CM

## 2012-04-01 DIAGNOSIS — R109 Unspecified abdominal pain: Secondary | ICD-10-CM

## 2012-04-01 DIAGNOSIS — F17200 Nicotine dependence, unspecified, uncomplicated: Secondary | ICD-10-CM | POA: Diagnosis not present

## 2012-04-01 DIAGNOSIS — M79602 Pain in left arm: Secondary | ICD-10-CM

## 2012-04-01 DIAGNOSIS — M79609 Pain in unspecified limb: Secondary | ICD-10-CM

## 2012-04-01 MED ORDER — CYCLOBENZAPRINE HCL 10 MG PO TABS: 10.0000 mg | ORAL_TABLET | Freq: Two times a day (BID) | ORAL | Status: DC | PRN

## 2012-04-01 MED ORDER — HYDROCODONE-ACETAMINOPHEN 5-500 MG PO TABS: 1.0000 | ORAL_TABLET | Freq: Two times a day (BID) | ORAL | Status: DC | PRN

## 2012-04-01 NOTE — Patient Instructions (Signed)
I think the arm pain is coming from neck.  Stretching exercises provided today.  Option is steroid injection into neck. Monitor abdominal symptoms for now. Let us know if not improving. I've refilled flexeril and hydrocodone.
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
## 2012-04-03 DIAGNOSIS — M5030 Other cervical disc degeneration, unspecified cervical region: Secondary | ICD-10-CM | POA: Insufficient documentation

## 2012-04-03 DIAGNOSIS — R109 Unspecified abdominal pain: Secondary | ICD-10-CM | POA: Insufficient documentation

## 2012-04-03 DIAGNOSIS — L989 Disorder of the skin and subcutaneous tissue, unspecified: Secondary | ICD-10-CM | POA: Insufficient documentation

## 2012-04-03 NOTE — Assessment & Plan Note (Signed)
With nausea and constipation/diarrhea (predominantly diarrhea). No fevers. Blood noted initially, pt attributed to excessive straining during loose bowel episode, on coumadin.  Now resolved. Anticipated food poisoning or viral gastroenteritis.  As improved, continue to monitor. If not improving, would further w/u. No recent colonoscopy or CPE.  Pt advised due for this and asked to schedule.

## 2012-04-03 NOTE — Assessment & Plan Note (Signed)
Continue to encourage cessation. 

## 2012-04-03 NOTE — Assessment & Plan Note (Signed)
Currently unable to eval given recent changes from irritation after recently picking at it. Advised to return when healed to eval, if not improved, will refer to derm.

## 2012-04-03 NOTE — Assessment & Plan Note (Addendum)
I do think this is cervicogenic, no red flags today. Known cervical disk disease: MRI 2012 - disk bulging and foraminal stenosis, advanced at C4/5, 5/6; rec pain management for ESI by Dr. Martha Clan  In past has not wanted ESI.  Discussed referral for this and further evaluation if interested. For now wants to try some stretching exercises, could consider PT.  Will continue vicodin and flexeril prn.

## 2012-04-05 ENCOUNTER — Ambulatory Visit: Payer: Medicare Other | Admitting: Family Medicine

## 2012-04-12 ENCOUNTER — Other Ambulatory Visit: Payer: Self-pay | Admitting: *Deleted

## 2012-04-12 MED ORDER — DICLOFENAC SODIUM 1 % TD GEL: 1.0000 "application " | Freq: Four times a day (QID) | TRANSDERMAL | Status: DC

## 2012-04-12 NOTE — Telephone Encounter (Signed)
Ok to refill 

## 2012-04-12 NOTE — Telephone Encounter (Signed)
Refilled and added to med list.

## 2012-04-12 NOTE — Telephone Encounter (Signed)
Received request for refill of voltaren gel. Not on med list. OK to refill?

## 2012-04-13 DIAGNOSIS — N529 Male erectile dysfunction, unspecified: Secondary | ICD-10-CM | POA: Diagnosis not present

## 2012-04-15 ENCOUNTER — Telehealth: Payer: Self-pay

## 2012-04-15 DIAGNOSIS — R109 Unspecified abdominal pain: Secondary | ICD-10-CM

## 2012-04-15 DIAGNOSIS — K921 Melena: Secondary | ICD-10-CM

## 2012-04-15 DIAGNOSIS — R194 Change in bowel habit: Secondary | ICD-10-CM

## 2012-04-15 NOTE — Telephone Encounter (Signed)
Caller: Catherine/Spouse; PCP: Eustaquio Boyden; CB#: [phone]; ; ; Call regarding Diarrhea X. 2. Weeks;  onset of new abdominal pain with continued diarrhea.  BS < 300.  Taking fluids well.  Per protocol, See in 4 Hours; no appts available in Epic.  TC to office; advised per staff to send to UC.

## 2012-04-15 NOTE — Telephone Encounter (Signed)
[name] with [facility] said pt seen 04/01/12 with diarrhea. Pt still having diarrhea with onset of abdominal pain today (? Upper or lower abdominal pain), pt is diabetic and on coumadin therapy; Pt had diarrhea x3-4 in last 24 hr period.No blood seen, pt nausea and spitting up but not vomiting; no fever. No recent colonoscopy. [name] said [facility] disposition is seen within 4 hours. Pt having abdominal pain with ongoing diarrhea to go to UC or ER for evaluation if needs diagnostic testing; labs, or xrays,US etc. [name] said would advise pts wife.

## 2012-04-18 NOTE — Telephone Encounter (Signed)
Noted. Can we call for an update on how he's doing?

## 2012-04-19 NOTE — Telephone Encounter (Signed)
Spoke with patient. He did not go to UC or ER. He was out of town when I spoke to him. He said things are episodic, not constant. He will have a cramping sensation in his stomach and then go through a cycle of constipation, loose stool and then diarrhea. It will be gone the next day and he will feel fine until the next episode. No fever, no blood in stool. No correlation of specific foods. I told him I would call him back if you had any suggestions/ideas. He mentioned possibly going to GI.

## 2012-04-20 NOTE — Telephone Encounter (Signed)
i'd like [place] to start by having him come in for blood work and abdominal xray.  And I want him to fill out iFOB.  If unrevealing may refer to GI.

## 2012-04-20 NOTE — Telephone Encounter (Signed)
Patient's wife notified and lab/xray scheduled.

## 2012-04-21 ENCOUNTER — Ambulatory Visit (INDEPENDENT_AMBULATORY_CARE_PROVIDER_SITE_OTHER)
Admission: RE | Admit: 2012-04-21 | Discharge: 2012-04-21 | Disposition: A | Discharge status: Home / Self Care | Payer: Medicare Other | Source: Ambulatory Visit | Attending: Family Medicine | Admitting: Family Medicine

## 2012-04-21 ENCOUNTER — Ambulatory Visit (INDEPENDENT_AMBULATORY_CARE_PROVIDER_SITE_OTHER): Payer: Medicare Other

## 2012-04-21 ENCOUNTER — Other Ambulatory Visit (INDEPENDENT_AMBULATORY_CARE_PROVIDER_SITE_OTHER): Payer: Medicare Other

## 2012-04-21 ENCOUNTER — Other Ambulatory Visit: Payer: Self-pay | Admitting: Cardiovascular Disease

## 2012-04-21 DIAGNOSIS — R109 Unspecified abdominal pain: Secondary | ICD-10-CM

## 2012-04-21 DIAGNOSIS — Z7901 Long term (current) use of anticoagulants: Secondary | ICD-10-CM | POA: Diagnosis not present

## 2012-04-21 DIAGNOSIS — R198 Other specified symptoms and signs involving the digestive system and abdomen: Secondary | ICD-10-CM | POA: Diagnosis not present

## 2012-04-21 DIAGNOSIS — I4891 Unspecified atrial fibrillation: Secondary | ICD-10-CM

## 2012-04-21 DIAGNOSIS — R194 Change in bowel habit: Secondary | ICD-10-CM

## 2012-04-21 LAB — COMPREHENSIVE METABOLIC PANEL
ALT: 28 U/L (ref 0–53)
AST: 23 U/L (ref 0–37)
CO2: 29 mEq/L (ref 19–32)
Calcium: 8.8 mg/dL (ref 8.4–10.5)
Chloride: 100 mEq/L (ref 96–112)
GFR: 67.22 mL/min (ref >60.00)
Sodium: 137 mEq/L (ref 135–145)
Total Bilirubin: 0.5 mg/dL (ref 0.3–1.2)
Total Protein: 6.4 g/dL (ref 6.0–8.3)

## 2012-04-21 LAB — CBC WITH DIFFERENTIAL/PLATELET
Basophils Absolute: 0 10*3/uL (ref 0.0–0.1)
Eosinophils Absolute: 0.3 10*3/uL (ref 0.0–0.7)
HCT: 48.4 % (ref 39.0–52.0)
Hemoglobin: 16.1 g/dL (ref 13.0–17.0)
Lymphs Abs: 2.1 10*3/uL (ref 0.7–4.0)
MCHC: 33.2 g/dL (ref 30.0–36.0)
Monocytes Relative: 6.9 % (ref 3.0–12.0)
Neutro Abs: 5.6 10*3/uL (ref 1.4–7.7)
Platelets: 247 10*3/uL (ref 150.0–400.0)
RDW: 13.5 % (ref 11.5–14.6)

## 2012-04-21 LAB — LIPASE: Lipase: 25 U/L (ref 11.0–59.0)

## 2012-04-22 ENCOUNTER — Other Ambulatory Visit: Payer: Self-pay | Admitting: Family Medicine

## 2012-04-22 MED ORDER — POLYETHYLENE GLYCOL 3350 17 GM/SCOOP PO POWD: 17.0000 g | Freq: Every day | ORAL | Status: AC | PRN

## 2012-04-22 NOTE — Telephone Encounter (Signed)
Please Refill

## 2012-04-23 ENCOUNTER — Other Ambulatory Visit: Payer: Self-pay

## 2012-04-23 MED ORDER — GLUCOSE BLOOD VI STRP: ORAL_STRIP | Status: DC

## 2012-04-23 NOTE — Telephone Encounter (Signed)
[name] request refill Onetouch ultra blueVI test strips. # 100 x 3 to [facility]. Powell notified refill sent in.

## 2012-04-27 DIAGNOSIS — N529 Male erectile dysfunction, unspecified: Secondary | ICD-10-CM | POA: Diagnosis not present

## 2012-04-29 ENCOUNTER — Encounter: Payer: Self-pay | Admitting: Family Medicine

## 2012-04-30 ENCOUNTER — Telehealth: Payer: Self-pay | Admitting: *Deleted

## 2012-04-30 NOTE — Telephone Encounter (Signed)
Form for DM in your IN box-needs signature.

## 2012-05-03 DIAGNOSIS — S40029A Contusion of unspecified upper arm, initial encounter: Secondary | ICD-10-CM | POA: Diagnosis not present

## 2012-05-03 NOTE — Telephone Encounter (Signed)
Filled and placed in Kim's box. 

## 2012-05-04 ENCOUNTER — Other Ambulatory Visit: Payer: Self-pay | Admitting: Family Medicine

## 2012-05-04 DIAGNOSIS — G4720 Circadian rhythm sleep disorder, unspecified type: Secondary | ICD-10-CM | POA: Diagnosis not present

## 2012-05-04 DIAGNOSIS — G4710 Hypersomnia, unspecified: Secondary | ICD-10-CM | POA: Diagnosis not present

## 2012-05-04 DIAGNOSIS — R0602 Shortness of breath: Secondary | ICD-10-CM | POA: Diagnosis not present

## 2012-05-04 DIAGNOSIS — G4730 Sleep apnea, unspecified: Secondary | ICD-10-CM | POA: Diagnosis not present

## 2012-05-04 NOTE — Telephone Encounter (Signed)
Form faxed

## 2012-05-04 NOTE — Telephone Encounter (Signed)
plz phone in hydrocodone 

## 2012-05-04 NOTE — Telephone Encounter (Signed)
Rx called in as directed.   

## 2012-05-08 DIAGNOSIS — S4991XA Unspecified injury of right shoulder and upper arm, initial encounter: Secondary | ICD-10-CM

## 2012-05-08 HISTORY — DX: Unspecified injury of right shoulder and upper arm, initial encounter: S49.91XA

## 2012-05-22 ENCOUNTER — Other Ambulatory Visit: Payer: Self-pay | Admitting: Family Medicine

## 2012-05-31 ENCOUNTER — Other Ambulatory Visit: Payer: Self-pay | Admitting: Family Medicine

## 2012-05-31 DIAGNOSIS — M75120 Complete rotator cuff tear or rupture of unspecified shoulder, not specified as traumatic: Secondary | ICD-10-CM | POA: Diagnosis not present

## 2012-05-31 DIAGNOSIS — M25519 Pain in unspecified shoulder: Secondary | ICD-10-CM | POA: Diagnosis not present

## 2012-05-31 NOTE — Telephone Encounter (Signed)
OK to refill

## 2012-05-31 NOTE — Telephone Encounter (Signed)
Rx called in as directed. Written script shredded.

## 2012-05-31 NOTE — Telephone Encounter (Signed)
plz phone in vicodin although it was printed.  Placed in Kim's box.

## 2012-06-03 ENCOUNTER — Encounter: Payer: Self-pay | Admitting: Family Medicine

## 2012-06-18 ENCOUNTER — Other Ambulatory Visit: Payer: Self-pay | Admitting: *Deleted

## 2012-06-18 MED ORDER — ALBUTEROL SULFATE HFA 108 (90 BASE) MCG/ACT IN AERS: 2.0000 | INHALATION_SPRAY | Freq: Four times a day (QID) | RESPIRATORY_TRACT | Status: DC | PRN

## 2012-06-19 ENCOUNTER — Other Ambulatory Visit: Payer: Self-pay | Admitting: Family Medicine

## 2012-06-22 ENCOUNTER — Other Ambulatory Visit: Payer: Self-pay | Admitting: Cardiovascular Disease

## 2012-06-22 MED ORDER — POTASSIUM CHLORIDE ER 10 MEQ PO TBCR: 20.0000 meq | EXTENDED_RELEASE_TABLET | Freq: Two times a day (BID) | ORAL | Status: DC

## 2012-06-22 NOTE — Telephone Encounter (Signed)
Refilled Potassium

## 2012-06-23 ENCOUNTER — Ambulatory Visit (INDEPENDENT_AMBULATORY_CARE_PROVIDER_SITE_OTHER): Payer: Medicare Other

## 2012-06-23 DIAGNOSIS — I4891 Unspecified atrial fibrillation: Secondary | ICD-10-CM | POA: Diagnosis not present

## 2012-06-23 DIAGNOSIS — Z7901 Long term (current) use of anticoagulants: Secondary | ICD-10-CM | POA: Diagnosis not present

## 2012-06-24 DIAGNOSIS — M75120 Complete rotator cuff tear or rupture of unspecified shoulder, not specified as traumatic: Secondary | ICD-10-CM | POA: Diagnosis not present

## 2012-06-26 DIAGNOSIS — R7309 Other abnormal glucose: Secondary | ICD-10-CM | POA: Diagnosis not present

## 2012-06-26 DIAGNOSIS — J209 Acute bronchitis, unspecified: Secondary | ICD-10-CM | POA: Diagnosis not present

## 2012-06-28 ENCOUNTER — Other Ambulatory Visit: Payer: Self-pay | Admitting: Family Medicine

## 2012-06-28 NOTE — Telephone Encounter (Signed)
Ok to refill 

## 2012-06-28 NOTE — Telephone Encounter (Signed)
[name], pts wife called to verify hydrocodone would be refilled today. I called med to [facility] as instructed. [name] notified while on phone med called in.

## 2012-06-28 NOTE — Telephone Encounter (Signed)
plz phone in. 

## 2012-07-05 ENCOUNTER — Other Ambulatory Visit: Payer: Self-pay

## 2012-07-05 MED ORDER — GABAPENTIN 600 MG PO TABS: 600.0000 mg | ORAL_TABLET | Freq: Two times a day (BID) | ORAL | Status: DC

## 2012-07-05 NOTE — Telephone Encounter (Signed)
Ok to refill 

## 2012-07-06 ENCOUNTER — Other Ambulatory Visit: Payer: Self-pay

## 2012-07-06 MED ORDER — GABAPENTIN 600 MG PO TABS: 600.0000 mg | ORAL_TABLET | Freq: Three times a day (TID) | ORAL | Status: DC

## 2012-07-06 NOTE — Telephone Encounter (Signed)
[name] with [facility] left v/m received electronic rx from Dr Reece Agar Gabapentin 600 mg # 60 taking one tab bid. [name] at [place] said pt had been taking Gabapentin 600 mg # 90 one tab tid. I spoke with pt just to verify what he was presently taking; pt taking Gabapentin 600 mg # 90 one tab 3 times a day.Please advise.[facility].

## 2012-07-06 NOTE — Telephone Encounter (Signed)
Sent in corrected script

## 2012-07-20 ENCOUNTER — Other Ambulatory Visit: Payer: Self-pay | Admitting: Family Medicine

## 2012-07-20 NOTE — Telephone Encounter (Signed)
Ok to refill 

## 2012-07-21 ENCOUNTER — Ambulatory Visit (INDEPENDENT_AMBULATORY_CARE_PROVIDER_SITE_OTHER): Payer: Medicare Other

## 2012-07-21 DIAGNOSIS — I4891 Unspecified atrial fibrillation: Secondary | ICD-10-CM

## 2012-07-21 DIAGNOSIS — Z7901 Long term (current) use of anticoagulants: Secondary | ICD-10-CM | POA: Diagnosis not present

## 2012-07-21 LAB — POCT INR: INR: 2.3

## 2012-07-26 ENCOUNTER — Other Ambulatory Visit: Payer: Self-pay | Admitting: Family Medicine

## 2012-07-26 DIAGNOSIS — M25519 Pain in unspecified shoulder: Secondary | ICD-10-CM | POA: Diagnosis not present

## 2012-07-26 DIAGNOSIS — M75120 Complete rotator cuff tear or rupture of unspecified shoulder, not specified as traumatic: Secondary | ICD-10-CM | POA: Diagnosis not present

## 2012-07-26 NOTE — Telephone Encounter (Signed)
plz phone in. 

## 2012-07-26 NOTE — Telephone Encounter (Signed)
Medication phoned to [facility] as instructed.

## 2012-07-26 NOTE — Telephone Encounter (Signed)
[facility] request refill Hydrocodone APAP.Please advise.

## 2012-07-30 ENCOUNTER — Encounter: Payer: Self-pay | Admitting: Cardiovascular Disease

## 2012-07-30 ENCOUNTER — Ambulatory Visit (INDEPENDENT_AMBULATORY_CARE_PROVIDER_SITE_OTHER): Payer: Medicare Other | Admitting: Cardiovascular Disease

## 2012-07-30 VITALS — BP 148/82 | HR 72 | Ht 73.0 in | Wt 286.2 lb

## 2012-07-30 DIAGNOSIS — I4891 Unspecified atrial fibrillation: Secondary | ICD-10-CM

## 2012-07-30 DIAGNOSIS — E1149 Type 2 diabetes mellitus with other diabetic neurological complication: Secondary | ICD-10-CM | POA: Diagnosis not present

## 2012-07-30 DIAGNOSIS — I10 Essential (primary) hypertension: Secondary | ICD-10-CM | POA: Diagnosis not present

## 2012-07-30 DIAGNOSIS — J449 Chronic obstructive pulmonary disease, unspecified: Secondary | ICD-10-CM

## 2012-07-30 DIAGNOSIS — E1142 Type 2 diabetes mellitus with diabetic polyneuropathy: Secondary | ICD-10-CM

## 2012-07-30 DIAGNOSIS — I714 Abdominal aortic aneurysm, without rupture: Secondary | ICD-10-CM | POA: Diagnosis not present

## 2012-07-30 MED ORDER — ATORVASTATIN CALCIUM 10 MG PO TABS: 10.0000 mg | ORAL_TABLET | Freq: Every day | ORAL | Status: DC

## 2012-07-30 MED ORDER — NITROGLYCERIN 0.4 MG/SPRAY TL SOLN: 1.0000 | Status: DC | PRN

## 2012-07-30 MED ORDER — WARFARIN SODIUM 5 MG PO TABS: ORAL_TABLET | ORAL | Status: DC

## 2012-07-30 NOTE — Patient Instructions (Addendum)
You are doing well.  Please start atorvastatin one a day for cholesterol  Please call us if you have new issues that need to be addressed before your next appt.  Your physician wants you to follow-up in: 6 months.  You will receive a reminder letter in the mail two months in advance. If you don't receive a letter, please call our office to schedule the follow-up appointment.   

## 2012-07-30 NOTE — Assessment & Plan Note (Signed)
We'll start him on low-dose statin again he is willing to retry. Goal LDL less than 70.

## 2012-07-30 NOTE — Assessment & Plan Note (Signed)
Blood pressure is well controlled on today's visit. No changes made to the medications. 
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
## 2012-07-30 NOTE — Assessment & Plan Note (Signed)
Maintaining normal sinus rhythm. Continue warfarin.

## 2012-07-30 NOTE — Assessment & Plan Note (Signed)
Stable on inhalers. Continues to smoke. Uses oxygen at nighttime with some relief. Unable to tolerate CPAP.

## 2012-07-30 NOTE — Assessment & Plan Note (Addendum)
Diabetes is poorly controlled. We have encouraged  careful diet management in an effort to lose weight.  His wife seems to do his medications. He may benefit from additional aggressive medical management.

## 2012-08-18 ENCOUNTER — Other Ambulatory Visit: Payer: Self-pay | Admitting: Cardiovascular Disease

## 2012-08-18 ENCOUNTER — Other Ambulatory Visit: Payer: Self-pay | Admitting: Family Medicine

## 2012-08-18 NOTE — Telephone Encounter (Signed)
Rx called in as directed.   

## 2012-08-18 NOTE — Telephone Encounter (Signed)
Please review and refill thank you.

## 2012-08-18 NOTE — Telephone Encounter (Signed)
plz phone in. 

## 2012-08-18 NOTE — Telephone Encounter (Signed)
OK to refill

## 2012-08-23 DIAGNOSIS — G4720 Circadian rhythm sleep disorder, unspecified type: Secondary | ICD-10-CM | POA: Diagnosis not present

## 2012-08-23 DIAGNOSIS — F17200 Nicotine dependence, unspecified, uncomplicated: Secondary | ICD-10-CM | POA: Diagnosis not present

## 2012-08-23 DIAGNOSIS — R0602 Shortness of breath: Secondary | ICD-10-CM | POA: Diagnosis not present

## 2012-08-23 DIAGNOSIS — G4710 Hypersomnia, unspecified: Secondary | ICD-10-CM | POA: Diagnosis not present

## 2012-08-23 DIAGNOSIS — G4730 Sleep apnea, unspecified: Secondary | ICD-10-CM | POA: Diagnosis not present

## 2012-09-10 ENCOUNTER — Other Ambulatory Visit: Payer: Self-pay | Admitting: Family Medicine

## 2012-09-10 NOTE — Telephone Encounter (Signed)
Ok to refill 

## 2012-09-14 ENCOUNTER — Other Ambulatory Visit: Payer: Self-pay | Admitting: Family Medicine

## 2012-09-14 NOTE — Telephone Encounter (Signed)
plz phone in. 

## 2012-09-15 NOTE — Telephone Encounter (Signed)
Rx called in as directed.   

## 2012-09-23 NOTE — Telephone Encounter (Signed)
Pt called said pain was was denied. I spoke with Delice Bison at [facility]; med was to be held until 09/18/12, will get ready for pt to pick up today.Patient notified as instructed by telephone.

## 2012-09-27 DIAGNOSIS — J441 Chronic obstructive pulmonary disease with (acute) exacerbation: Secondary | ICD-10-CM | POA: Diagnosis not present

## 2012-09-29 ENCOUNTER — Other Ambulatory Visit: Payer: Self-pay | Admitting: *Deleted

## 2012-09-29 NOTE — Telephone Encounter (Signed)
Error

## 2012-09-30 ENCOUNTER — Other Ambulatory Visit: Payer: Self-pay | Admitting: *Deleted

## 2012-09-30 MED ORDER — POTASSIUM CHLORIDE ER 10 MEQ PO TBCR: 20.0000 meq | EXTENDED_RELEASE_TABLET | Freq: Two times a day (BID) | ORAL | Status: DC

## 2012-09-30 NOTE — Telephone Encounter (Signed)
Refilled Potassium

## 2012-10-06 ENCOUNTER — Ambulatory Visit (INDEPENDENT_AMBULATORY_CARE_PROVIDER_SITE_OTHER): Payer: Medicare Other

## 2012-10-06 DIAGNOSIS — I4891 Unspecified atrial fibrillation: Secondary | ICD-10-CM

## 2012-10-06 DIAGNOSIS — Z7901 Long term (current) use of anticoagulants: Secondary | ICD-10-CM

## 2012-10-06 LAB — POCT INR: INR: 1.4

## 2012-10-08 ENCOUNTER — Other Ambulatory Visit: Payer: Self-pay | Admitting: Family Medicine

## 2012-10-08 NOTE — Telephone Encounter (Signed)
OK to refill

## 2012-10-22 ENCOUNTER — Other Ambulatory Visit: Payer: Self-pay | Admitting: Family Medicine

## 2012-10-22 NOTE — Telephone Encounter (Signed)
plz phone in. 

## 2012-10-22 NOTE — Telephone Encounter (Signed)
OK to refill

## 2012-10-22 NOTE — Telephone Encounter (Signed)
Rx called in as directed.   

## 2012-11-03 ENCOUNTER — Ambulatory Visit (INDEPENDENT_AMBULATORY_CARE_PROVIDER_SITE_OTHER): Payer: Medicare Other

## 2012-11-03 DIAGNOSIS — I4891 Unspecified atrial fibrillation: Secondary | ICD-10-CM

## 2012-11-03 DIAGNOSIS — Z7901 Long term (current) use of anticoagulants: Secondary | ICD-10-CM

## 2012-11-13 ENCOUNTER — Other Ambulatory Visit: Payer: Self-pay | Admitting: Family Medicine

## 2012-11-16 DIAGNOSIS — M25519 Pain in unspecified shoulder: Secondary | ICD-10-CM | POA: Diagnosis not present

## 2012-12-02 ENCOUNTER — Other Ambulatory Visit: Payer: Self-pay | Admitting: Family Medicine

## 2012-12-03 NOTE — Telephone Encounter (Signed)
Px written for call in   

## 2012-12-03 NOTE — Telephone Encounter (Signed)
Ok to refill in Dr. Timoteo Expose absence? Please send back to me. Thanks

## 2012-12-03 NOTE — Telephone Encounter (Signed)
Rx called in as directed.   

## 2012-12-10 ENCOUNTER — Other Ambulatory Visit: Payer: Self-pay | Admitting: Family Medicine

## 2012-12-10 NOTE — Telephone Encounter (Signed)
Ok to refill 

## 2012-12-13 ENCOUNTER — Telehealth: Payer: Self-pay

## 2012-12-13 MED ORDER — NITROGLYCERIN 0.4 MG SL SUBL: 0.4000 mg | SUBLINGUAL_TABLET | SUBLINGUAL | Status: DC | PRN

## 2012-12-13 NOTE — Telephone Encounter (Signed)
Refill sent for SL NTG 0.4 mg take one table as needed

## 2012-12-22 ENCOUNTER — Telehealth: Payer: Self-pay

## 2012-12-22 DIAGNOSIS — J441 Chronic obstructive pulmonary disease with (acute) exacerbation: Secondary | ICD-10-CM | POA: Diagnosis not present

## 2012-12-22 NOTE — Telephone Encounter (Signed)
[name] request status of refill glucose test strips at [facility] on [address]. Kathlene November at [facility] said pt has refills available. [name] will pick up refill.

## 2012-12-22 NOTE — Telephone Encounter (Signed)
Kathlene November with [facility] called back; a certificate of medical necessity stating frequency of testing and dx. needs to be filled out and faxed back to [facility] medicare dept. Kathlene November said medicare dept will fax form to office. Kathlene November will let [name] know about delay of refill until form completed.

## 2012-12-22 NOTE — Telephone Encounter (Signed)
[facility] faxed form for Dr Sharen Hones signature; in Dr Sharen Hones in box.

## 2012-12-23 NOTE — Telephone Encounter (Signed)
Filled and placed in my out box. 

## 2012-12-24 ENCOUNTER — Telehealth: Payer: Self-pay | Admitting: Family Medicine

## 2012-12-24 NOTE — Telephone Encounter (Signed)
Molli Knock, pt's wife, called about the status of form that was sent over by [facility] for pt's test strips.

## 2012-12-24 NOTE — Telephone Encounter (Signed)
Opened in error

## 2012-12-27 NOTE — Telephone Encounter (Signed)
Form faxed

## 2012-12-31 ENCOUNTER — Ambulatory Visit (INDEPENDENT_AMBULATORY_CARE_PROVIDER_SITE_OTHER): Payer: Medicare Other | Admitting: Family Medicine

## 2012-12-31 ENCOUNTER — Encounter: Payer: Self-pay | Admitting: Family Medicine

## 2012-12-31 VITALS — BP 132/78 | HR 80 | Temp 98.2°F | Wt 281.2 lb

## 2012-12-31 DIAGNOSIS — F17200 Nicotine dependence, unspecified, uncomplicated: Secondary | ICD-10-CM

## 2012-12-31 DIAGNOSIS — I4891 Unspecified atrial fibrillation: Secondary | ICD-10-CM

## 2012-12-31 DIAGNOSIS — J441 Chronic obstructive pulmonary disease with (acute) exacerbation: Secondary | ICD-10-CM

## 2012-12-31 LAB — POCT INR: INR: 2.1

## 2012-12-31 MED ORDER — DOXYCYCLINE HYCLATE 100 MG PO CAPS: 100.0000 mg | ORAL_CAPSULE | Freq: Two times a day (BID) | ORAL | Status: DC

## 2012-12-31 MED ORDER — HYDROCODONE-ACETAMINOPHEN 5-500 MG PO TABS: 1.0000 | ORAL_TABLET | Freq: Two times a day (BID) | ORAL | Status: DC | PRN

## 2012-12-31 MED ORDER — VARENICLINE TARTRATE 1 MG PO TABS: 1.0000 mg | ORAL_TABLET | Freq: Two times a day (BID) | ORAL | Status: DC

## 2012-12-31 MED ORDER — PREDNISONE 20 MG PO TABS: ORAL_TABLET | ORAL | Status: DC

## 2012-12-31 NOTE — Patient Instructions (Signed)
Coumadin checked today.  This is very important you check monthly. I think you have COPD flare - treat with steroid course as well as doxycycline twice daily. Doxycycline may make your blood thinner so return next Wednesday for a recheck with coumadin clinic at Dr. Windell Hummingbird office. Please let us know if not improving as expected.
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
## 2013-01-01 ENCOUNTER — Encounter: Payer: Self-pay | Admitting: Family Medicine

## 2013-01-01 DIAGNOSIS — J441 Chronic obstructive pulmonary disease with (acute) exacerbation: Secondary | ICD-10-CM | POA: Insufficient documentation

## 2013-01-01 NOTE — Assessment & Plan Note (Signed)
Has not had coumadin checked since November. check today, recommend return to coumadin clinic for further management next week as starting doxycycline lvl 2.1 today - no changes to coumadin dosing today.

## 2013-01-01 NOTE — Assessment & Plan Note (Addendum)
Anticipate COPD exacerbation - will treat with steroid course and doxycycline. rec continued use of albuterol at home.

## 2013-01-01 NOTE — Assessment & Plan Note (Signed)
chantix refilled per pt request. Prior reaction to chanitx, but states currently tolerating well without side effects and desires to continue States helping cut back his smoking.

## 2013-01-03 ENCOUNTER — Telehealth: Payer: Self-pay

## 2013-01-03 NOTE — Telephone Encounter (Signed)
Pt refused to make an appointment for Wednesday 01/05/13, he states if he is in town he will come to the office and have his INR checked.  Pt states he never knows when he will be in town so he can't make an appt.  Advised pt Doxycycline can adversely effect Coumadin and increase INR which puts pt at an increased risk to bleed.  Emphasized importance of monitoring Coumadin while on abx treatment.  Pt verbalized understanding and will try to come in 01/05/13 for INR check.  Pt aware of increased risk of bleeding.

## 2013-01-03 NOTE — Telephone Encounter (Signed)
Message copied by Migdalia Dk on Mon Jan 03, 2013  1:39 PM ------      Message from: Eustaquio Boyden      Created: Fri Dec 31, 2012  5:24 PM       Discussed results with pt.  No changes today.  Continue coumadin 5mg  daily.  [facility].  As starting doxy, asked pt to return on Wednesday for recheck at [facility].

## 2013-01-06 ENCOUNTER — Other Ambulatory Visit: Payer: Self-pay

## 2013-01-06 MED ORDER — POTASSIUM CHLORIDE ER 10 MEQ PO TBCR: 20.0000 meq | EXTENDED_RELEASE_TABLET | Freq: Two times a day (BID) | ORAL | Status: DC

## 2013-01-06 NOTE — Telephone Encounter (Signed)
Refill sent for potassium cl 10 meq

## 2013-01-07 ENCOUNTER — Ambulatory Visit (INDEPENDENT_AMBULATORY_CARE_PROVIDER_SITE_OTHER): Payer: Medicare Other | Admitting: Family Medicine

## 2013-01-07 ENCOUNTER — Other Ambulatory Visit (INDEPENDENT_AMBULATORY_CARE_PROVIDER_SITE_OTHER): Payer: Medicare Other

## 2013-01-07 ENCOUNTER — Other Ambulatory Visit: Payer: Medicare Other

## 2013-01-07 ENCOUNTER — Telehealth: Payer: Self-pay | Admitting: Family Medicine

## 2013-01-07 DIAGNOSIS — I4891 Unspecified atrial fibrillation: Secondary | ICD-10-CM

## 2013-01-07 NOTE — Telephone Encounter (Signed)
[name] has already contacted pt about PT/INR today.

## 2013-01-07 NOTE — Progress Notes (Signed)
Agree.   Lab Results  Component Value Date   INR 1.9 01/07/2013   INR 2.1 12/31/2012   INR 2.2 11/03/2012  to finish doxy.

## 2013-01-07 NOTE — Telephone Encounter (Signed)
Patient Information:  Caller Name: [name]  Phone: [phone]  Patient: Jim, Moore  Gender: Male  DOB: 21-Jun-1946  Age: 67 Years  PCP: Eustaquio Boyden [facility])  Office Follow Up:  Does the office need to follow up with this patient?: Yes  Instructions For The Office: OFFICE PLEASE REVIEW REGARDING REPEAT PT/INR  RN Note:  Instructed wife that message will be sent for MD to review and she will receive a call back; if no return call by later this afternoon; instructed to call office back; will comply  Symptoms  Reason For Call & Symptoms: Spouse is calling and states that pt was seen in the office on 12/31/12; pt had not had Coumadin lab work done in [place];  they did draw a PT/INR at that time; was instructedthat he needed to be seen in the Coumadin Clinic;   he did attempt to get PT/INR again on 01/05/13 and no one was at the cardiologist office/Coumadin Clinic; pt is wanting to come in today for a repeat PT/INR; pt is taking Coumadin 5mg  daily;  he is concerned about not having his PT/INR rechecked as MD advised; pt is on antibiotic now that may increase his INR levels and increase his risk of bleeding per MD note in EPIC; denies any bleeding  Reviewed Health History In EMR: Yes  Reviewed Medications In EMR: Yes  Reviewed Allergies In EMR: Yes  Reviewed Surgeries / Procedures: Yes  Date of Onset of Symptoms: Unknown  Guideline(s) Used:  No Protocol Available - Information Only  Disposition Per Guideline:   Discuss with PCP and Callback by Nurse within 1 Hour  Reason For Disposition Reached:   Nursing judgment  Advice Given:  N/A

## 2013-01-07 NOTE — Patient Instructions (Signed)
Continue and finish antibiotics, schedule an appt with the coumadin clinic for 2 weeks

## 2013-01-07 NOTE — Telephone Encounter (Signed)
Please have him come in for POCT PT/INR today and I will review.

## 2013-01-19 ENCOUNTER — Ambulatory Visit: Payer: Medicare Other | Admitting: Cardiovascular Disease

## 2013-01-24 ENCOUNTER — Ambulatory Visit (INDEPENDENT_AMBULATORY_CARE_PROVIDER_SITE_OTHER): Payer: Medicare Other | Admitting: Cardiovascular Disease

## 2013-01-24 ENCOUNTER — Encounter: Payer: Self-pay | Admitting: Cardiovascular Disease

## 2013-01-24 VITALS — BP 124/86 | HR 76 | Ht 73.0 in | Wt 276.2 lb

## 2013-01-24 DIAGNOSIS — R0602 Shortness of breath: Secondary | ICD-10-CM

## 2013-01-24 DIAGNOSIS — E785 Hyperlipidemia, unspecified: Secondary | ICD-10-CM

## 2013-01-24 DIAGNOSIS — I714 Abdominal aortic aneurysm, without rupture, unspecified: Secondary | ICD-10-CM

## 2013-01-24 DIAGNOSIS — E1149 Type 2 diabetes mellitus with other diabetic neurological complication: Secondary | ICD-10-CM

## 2013-01-24 DIAGNOSIS — I4891 Unspecified atrial fibrillation: Secondary | ICD-10-CM | POA: Diagnosis not present

## 2013-01-24 DIAGNOSIS — F17200 Nicotine dependence, unspecified, uncomplicated: Secondary | ICD-10-CM

## 2013-01-24 DIAGNOSIS — J449 Chronic obstructive pulmonary disease, unspecified: Secondary | ICD-10-CM

## 2013-01-24 DIAGNOSIS — I10 Essential (primary) hypertension: Secondary | ICD-10-CM

## 2013-01-24 DIAGNOSIS — J4489 Other specified chronic obstructive pulmonary disease: Secondary | ICD-10-CM

## 2013-01-24 MED ORDER — COLESTIPOL HCL 1 G PO TABS: 2.0000 g | ORAL_TABLET | Freq: Two times a day (BID) | ORAL | Status: DC

## 2013-01-24 NOTE — Assessment & Plan Note (Signed)
We'll order a repeat ultrasound will as it has been over one year since his AAA /stent was evaluated

## 2013-01-24 NOTE — Assessment & Plan Note (Signed)
We have encouraged him to continue to work on weaning his cigarettes and smoking cessation. He will continue to work on this and does not want any assistance with chantix.  

## 2013-01-24 NOTE — Assessment & Plan Note (Signed)
Blood pressure is well controlled on today's visit. No changes made to the medications. 

## 2013-01-24 NOTE — Assessment & Plan Note (Signed)
We have encouraged continued exercise, careful diet management in an effort to lose weight. 

## 2013-01-24 NOTE — Patient Instructions (Addendum)
You are doing well.  Please start colestipol two pills twice a day  Please call us if you have new issues that need to be addressed before your next appt.  Your physician wants you to follow-up in: 6 months.  You will receive a reminder letter in the mail two months in advance. If you don't receive a letter, please call our office to schedule the follow-up appointment.
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
## 2013-01-24 NOTE — Assessment & Plan Note (Signed)
We will prescribe colestipol 2 g twice a day. He is intolerant of statins.

## 2013-01-24 NOTE — Assessment & Plan Note (Signed)
Chronic moderate shortness of breath, likely secondary to COPD, deconditioning and obesity.

## 2013-02-08 ENCOUNTER — Other Ambulatory Visit: Payer: Self-pay | Admitting: Family Medicine

## 2013-02-08 NOTE — Telephone Encounter (Signed)
Received request from pharmacy that vicodin 5-500 is no longer available and they want to change it to 5-325 if that is okay. Please advise.

## 2013-02-08 NOTE — Telephone Encounter (Signed)
Ok to refill 

## 2013-02-09 MED ORDER — HYDROCODONE-ACETAMINOPHEN 5-325 MG PO TABS: 1.0000 | ORAL_TABLET | Freq: Two times a day (BID) | ORAL | Status: DC | PRN

## 2013-02-09 NOTE — Telephone Encounter (Signed)
plz phone in new dose vicodin.

## 2013-02-09 NOTE — Telephone Encounter (Signed)
Rx called in as directed.   

## 2013-02-17 DIAGNOSIS — E11329 Type 2 diabetes mellitus with mild nonproliferative diabetic retinopathy without macular edema: Secondary | ICD-10-CM | POA: Diagnosis not present

## 2013-02-24 ENCOUNTER — Other Ambulatory Visit: Payer: Self-pay | Admitting: *Deleted

## 2013-02-24 MED ORDER — METFORMIN HCL 500 MG PO TABS: ORAL_TABLET | ORAL | Status: DC

## 2013-03-09 ENCOUNTER — Other Ambulatory Visit: Payer: Self-pay | Admitting: Family Medicine

## 2013-03-09 NOTE — Telephone Encounter (Signed)
OK to refill

## 2013-03-09 NOTE — Telephone Encounter (Signed)
plz phone in hydrocodone 

## 2013-03-10 NOTE — Telephone Encounter (Signed)
Rx called as directed. 

## 2013-03-16 ENCOUNTER — Encounter: Payer: Self-pay | Admitting: Cardiovascular Disease

## 2013-03-16 ENCOUNTER — Ambulatory Visit (INDEPENDENT_AMBULATORY_CARE_PROVIDER_SITE_OTHER): Payer: Medicare Other

## 2013-03-16 DIAGNOSIS — I4891 Unspecified atrial fibrillation: Secondary | ICD-10-CM | POA: Diagnosis not present

## 2013-03-16 MED ORDER — WARFARIN SODIUM 5 MG PO TABS: ORAL_TABLET | ORAL | Status: DC

## 2013-03-17 ENCOUNTER — Other Ambulatory Visit: Payer: Self-pay | Admitting: Family Medicine

## 2013-03-23 ENCOUNTER — Other Ambulatory Visit: Payer: Self-pay | Admitting: *Deleted

## 2013-03-23 MED ORDER — OMEPRAZOLE 40 MG PO CPDR: 40.0000 mg | DELAYED_RELEASE_CAPSULE | Freq: Every day | ORAL | Status: DC

## 2013-04-04 ENCOUNTER — Other Ambulatory Visit: Payer: Self-pay | Admitting: Family Medicine

## 2013-04-05 NOTE — Telephone Encounter (Signed)
plz phone in norco.

## 2013-04-05 NOTE — Telephone Encounter (Signed)
Rx called in as directed.   

## 2013-04-13 ENCOUNTER — Other Ambulatory Visit: Payer: Self-pay | Admitting: Family Medicine

## 2013-04-21 ENCOUNTER — Telehealth: Payer: Self-pay

## 2013-04-21 NOTE — Telephone Encounter (Signed)
Wife says pt went to urgent care over w/e for vomiting and diarrhea. No new meds were added but they suggested he have CBC and f/u with PCP So pt has appt with Dr. Danise Edge tomm am Pt taking warfarin daily C/o black tarry stools over the last few days Denies further vomiting/diarrhea He stopped pletal Sunday on his own I advised to have him keep appt with PCP tomm as scheduled, hold warfarin tonight and keep appt with [place] as scheduled for 5/19 Understanding verb

## 2013-04-21 NOTE — Telephone Encounter (Signed)
Pt wife called and states that pt was in urgent care this past weekend, with vomiting, diarrhea, states now his stools are dark black , states it may be from the Warfarin. She scheduled pt appt with Alinda Money on 5/19(Monday). Please call.

## 2013-04-22 ENCOUNTER — Encounter: Payer: Self-pay | Admitting: Gastroenterology

## 2013-04-22 ENCOUNTER — Ambulatory Visit (INDEPENDENT_AMBULATORY_CARE_PROVIDER_SITE_OTHER): Payer: Medicare Other | Admitting: Family Medicine

## 2013-04-22 ENCOUNTER — Encounter: Payer: Self-pay | Admitting: Family Medicine

## 2013-04-22 VITALS — BP 120/70 | HR 60 | Temp 97.4°F | Wt 264.0 lb

## 2013-04-22 DIAGNOSIS — K760 Fatty (change of) liver, not elsewhere classified: Secondary | ICD-10-CM

## 2013-04-22 DIAGNOSIS — K7689 Other specified diseases of liver: Secondary | ICD-10-CM

## 2013-04-22 DIAGNOSIS — E1149 Type 2 diabetes mellitus with other diabetic neurological complication: Secondary | ICD-10-CM | POA: Diagnosis not present

## 2013-04-22 DIAGNOSIS — K921 Melena: Secondary | ICD-10-CM | POA: Diagnosis not present

## 2013-04-22 DIAGNOSIS — D72829 Elevated white blood cell count, unspecified: Secondary | ICD-10-CM

## 2013-04-22 DIAGNOSIS — R197 Diarrhea, unspecified: Secondary | ICD-10-CM | POA: Diagnosis not present

## 2013-04-22 DIAGNOSIS — I4891 Unspecified atrial fibrillation: Secondary | ICD-10-CM | POA: Diagnosis not present

## 2013-04-22 DIAGNOSIS — I10 Essential (primary) hypertension: Secondary | ICD-10-CM

## 2013-04-22 DIAGNOSIS — F17200 Nicotine dependence, unspecified, uncomplicated: Secondary | ICD-10-CM

## 2013-04-22 LAB — COMPREHENSIVE METABOLIC PANEL
ALT: 15 U/L (ref 0–53)
AST: 16 U/L (ref 0–37)
Albumin: 3.1 g/dL — ABNORMAL LOW (ref 3.5–5.2)
BUN: 15 mg/dL (ref 6–23)
Calcium: 9.1 mg/dL (ref 8.4–10.5)
Chloride: 101 mEq/L (ref 96–112)
Potassium: 4.8 mEq/L (ref 3.5–5.1)

## 2013-04-22 LAB — CBC WITH DIFFERENTIAL/PLATELET
Eosinophils Absolute: 0.3 10*3/uL (ref 0.0–0.7)
MCHC: 33.1 g/dL (ref 30.0–36.0)
MCV: 89.7 fl (ref 78.0–100.0)
Monocytes Absolute: 0.6 10*3/uL (ref 0.1–1.0)
Neutrophils Relative %: 63.4 % (ref 43.0–77.0)
Platelets: 279 10*3/uL (ref 150.0–400.0)

## 2013-04-22 LAB — LIPASE: Lipase: 21 U/L (ref 11.0–59.0)

## 2013-04-22 LAB — POCT INR: INR: 2.6

## 2013-04-22 LAB — HEMOGLOBIN A1C: Hgb A1c MFr Bld: 9.8 % — ABNORMAL HIGH (ref 4.6–6.5)

## 2013-04-22 LAB — MICROALBUMIN / CREATININE URINE RATIO: Microalb, Ur: 0.9 mg/dL (ref 0.0–1.9)

## 2013-04-22 NOTE — Progress Notes (Signed)
Subjective:    Patient ID: [name], male    DOB: [birth year], 67 y.o.   MRN: [id number]  HPI CC: diarrhea  Wants to stop all meds - because not feeling well.  Over last year having trouble with diarrhea.  Having dark green gritty stools.  Notices trouble with intermittent episodes of diarrhea and vomiting that lasts several hours. Denies black tarry stools.   No abd pain with this.  Some cramping when needs to stool, but resolves with evacuation. H/o hemorrhoids, notes occasional blood clots and blood when wiping. Seen at [facility] yesterday - told positive stool card, recommended f/u with PCP for blood work. Truck driver , travels frequently.  Colonoscopy - has never had performed.  H/o uncontrolled diabetes.  States average sugars 150-160.    Lab Results  Component Value Date   HGBA1C 10.8* 02/27/2012   Wt Readings from Last 3 Encounters:  04/22/13 264 lb (119.75 kg)  01/24/13 276 lb 4 oz (125.306 kg)  12/31/12 281 lb 4 oz (127.574 kg)  Continued weight loss noted. Trying to lose weight.  No soda.  Lots of water.  Stopped drinking at night.  Cut back on tea (unsweet).    Past Medical History  Diagnosis Date  . COPD (chronic obstructive pulmonary disease)     mod-severe COPD/emphysema.  PFTs 12/2010.  He still smokes 1 ppd.  . Obesity   . GERD (gastroesophageal reflux disease)   . Muscle spasm     chronic  . Neuralgia     pain in hands. L>R from accident  . Osteoarthritis   . Paroxysmal atrial fibrillation     on coumadin only.  . Hyperlipidemia     myalgias with simvastatin and atorvastatin  . T2DM (type 2 diabetes mellitus)   . Peripheral autonomic neuropathy due to diabetes mellitus   . Leg cramps     idiopathic severe  . Smoker     1ppd  . AAA (abdominal aortic aneurysm)     with thrombosis - 6 x 4.9 cm  . Fatty liver   . Cervical neck pain with evidence of disc disease 07/2011    MRI - disk bulging and foraminal stenosis, advanced at C4/5, 5/6; rec pain  management for ESI by Dr. Martha Clan   . CHF (congestive heart failure)   . Depression   . ED (erectile dysfunction) 02/2012    penile injections - failed viagra, poor arterial flow (Tannenbaum)  . OSA (obstructive sleep apnea) 03/2012    AHI 18.6, desat to 74%, severe snoring, consider ENT eval  . Right shoulder injury 05/2012    after fall out of chair, s/p injection, rec conservative management with PT Thurston Hole)    Review of Systems Per HPI    Objective:   Physical Exam  Nursing note and vitals reviewed. Constitutional: He appears well-developed and well-nourished. No distress.  HENT:  Mouth/Throat: Oropharynx is clear and moist. No oropharyngeal exudate.  Eyes: Conjunctivae and EOM are normal. Pupils are equal, round, and reactive to light.  Cardiovascular: Normal rate, normal heart sounds and intact distal pulses.  An irregular rhythm present.  No murmur heard. Pulmonary/Chest: Effort normal and breath sounds normal. No respiratory distress. He has no wheezes. He has no rales.  Abdominal: Soft. Bowel sounds are normal. He exhibits no distension, no fluid wave, no abdominal bruit and no mass. There is no hepatosplenomegaly. There is no tenderness. There is no rigidity, no rebound, no guarding, no CVA tenderness and negative Murphy's sign. No hernia.  obese  Musculoskeletal: He exhibits no edema.  Skin: Skin is warm and dry. No rash noted.       Assessment & Plan:

## 2013-04-22 NOTE — Patient Instructions (Addendum)
Blood work today. Let's decrease metformin to one pill twice daily. Pass by Marion's office to schedule GI evaluation of blood in stool. Good to see you today.

## 2013-04-24 ENCOUNTER — Encounter: Payer: Self-pay | Admitting: Family Medicine

## 2013-04-24 DIAGNOSIS — R197 Diarrhea, unspecified: Secondary | ICD-10-CM | POA: Insufficient documentation

## 2013-04-24 LAB — HEMOCCULT GUIAC POC 1CARD (OFFICE): Fecal Occult Blood, POC: POSITIVE

## 2013-04-24 NOTE — Assessment & Plan Note (Addendum)
Chronic, uncontrolled. Encouraged tighter control of glucose through healthier diet choices. Check A1c and microalb today.

## 2013-04-24 NOTE — Assessment & Plan Note (Signed)
Continue to encourage cessation.  Bought Ecig, which is helping.

## 2013-04-24 NOTE — Assessment & Plan Note (Addendum)
With positive hemoccult in office today. On coumadin - INR today 2.6.  Coumadin on hold per cards, has f/u next week. Check Hgb today, as well as CMP, TSH, lipase. ?metformin related - will decrease for now to 500mg  bid. No recent abx.  However, consider checking stool cultures and c diff for prolonged diarrhea. Given longstanding BM change, as well as positive hemoccult, pt will need further eval by GI (likely colonoscopy for current diarrhea and for colon cancer screening)

## 2013-04-24 NOTE — Assessment & Plan Note (Signed)
Chronic, stable. Continue meds. 

## 2013-04-25 ENCOUNTER — Ambulatory Visit (INDEPENDENT_AMBULATORY_CARE_PROVIDER_SITE_OTHER): Payer: Medicare Other | Admitting: Physician Assistant

## 2013-04-25 ENCOUNTER — Encounter: Payer: Self-pay | Admitting: *Deleted

## 2013-04-25 ENCOUNTER — Encounter: Payer: Self-pay | Admitting: Physician Assistant

## 2013-04-25 VITALS — BP 128/85 | HR 74 | Ht 73.0 in | Wt 264.0 lb

## 2013-04-25 DIAGNOSIS — I4891 Unspecified atrial fibrillation: Secondary | ICD-10-CM | POA: Diagnosis not present

## 2013-04-25 DIAGNOSIS — I714 Abdominal aortic aneurysm, without rupture, unspecified: Secondary | ICD-10-CM

## 2013-04-25 DIAGNOSIS — Z720 Tobacco use: Secondary | ICD-10-CM

## 2013-04-25 DIAGNOSIS — E785 Hyperlipidemia, unspecified: Secondary | ICD-10-CM | POA: Diagnosis not present

## 2013-04-25 DIAGNOSIS — K922 Gastrointestinal hemorrhage, unspecified: Secondary | ICD-10-CM | POA: Diagnosis not present

## 2013-04-25 DIAGNOSIS — I480 Paroxysmal atrial fibrillation: Secondary | ICD-10-CM

## 2013-04-25 DIAGNOSIS — F17200 Nicotine dependence, unspecified, uncomplicated: Secondary | ICD-10-CM

## 2013-04-25 NOTE — Patient Instructions (Addendum)
We will schedule you for a repeat abdominal ultrasound.   Continue Coumadin for now. We will be in contact with your GI doctor regarding when to hold this for your endoscopies.   Hold Pletal for now. We will re-address this on follow-up.   We will see you back in 2 weeks to 1 month. Please call when you finalize when the endoscopies will be performed.

## 2013-04-25 NOTE — Assessment & Plan Note (Addendum)
S/p endovascular stenting 11/2011. Will plan repeat abdominal ultrasound in 1 month.

## 2013-04-25 NOTE — Assessment & Plan Note (Addendum)
NSR on EKG today. CHADSVASc at least 3. INR 2.6 last week. Hgb/Hct WNL. Continue Coumadin for thromboembolism protection for now. Will hold leading up to any planned GI interventions.

## 2013-04-25 NOTE — Assessment & Plan Note (Signed)
Continue statin. 

## 2013-04-25 NOTE — Assessment & Plan Note (Addendum)
Endorsement of dark tarry stools and positive hemoccult concerning for upper GI bleed. No evidence of anemia on CBC last week. After discussing with Dr. Kirke Corin, given risk of thromboembolism with underlying a-fib and high CHADSVASc score, will plan to continue Coumadin for now, and stop 5 days leading up to upper/lower endoscopies planned by GI. Will communicate with Dr. Russella Dar regarding timing of this.

## 2013-04-25 NOTE — Progress Notes (Addendum)
Patient ID: [name], male   DOB: 09-24-1946, 67 y.o.   MRN: [id number]            Date:  04/25/2013   ID:  [name], [name] [birth year], MRN [id number]  PCP:  Eustaquio Boyden, MD  Primary Cardiologist:  Concha Se, MD   History of Present Illness: [name] is a 67 y.o. male with PMHx s/f PAF (Coumadin recently held for GIB), COPD, ongoing tobacco abuse, OSA (on CPAP), AAA (s/p endovascular stent 11/2011), hyperlipidemia, type 2 DM, obesity, history of falls and recent diagnosis of GIB who presents for cardiology followup.   He was last seen by Dr. Mariah Milling in 01/2013 for follow-up. He was started on pletal given intolerance to statins. A repeat ultrasound was planned for routine monitoring of his AAA, scheduled date/time pending.    Over the past weekend, he called the office c/o nausea/vomiting and diarrhea for several days. He self-terminated colestipol. There were concerns regarding concomitant Coumadin use. He was seen by his PCP Dr. Sharen Hones 04/22/13. Stool cards returned positive. Follow-up GI for upper/lower endoscopies planned/recommended. He is to see GI on 04/27/13 for an establishing visit. Metformin was decreased. E-cigs for tobacco cessation. Ongoing DM2 management. Labwork drawn revealed - Hgb/Hct/MCV WNL. CMET unremarkable. INR 2.6. TSH 1.20. Hgb A1C 9.8%.   He continues to have dark gritty stools and diarrhea. This was preceded by nausea and vomiting. He attributed it originally to food poisoning. Denies significant NSAID use. He denies any new symptoms today- no chest pain, worsening SOB/DOE, palpitations, LE edema, PND, orthopnea, lightheadedness or syncope. He confirms colestipol has been stopped. Coumadin continued.   EKG: NSR, 74 bpm, low voltage QRS III, aVF, flattened TWs diffusely  Wt Readings from Last 3 Encounters:  04/25/13 264 lb (119.75 kg)  04/22/13 264 lb (119.75 kg)  01/24/13 276 lb 4 oz (125.306 kg)     Past Medical History  Diagnosis Date  . COPD  (chronic obstructive pulmonary disease)     mod-severe COPD/emphysema.  PFTs 12/2010.  He still smokes 1 ppd.  . Obesity   . GERD (gastroesophageal reflux disease)   . Muscle spasm     chronic  . Neuralgia     pain in hands. L>R from accident  . Osteoarthritis   . Paroxysmal atrial fibrillation     on coumadin only.  . Hyperlipidemia     myalgias with simvastatin and atorvastatin  . T2DM (type 2 diabetes mellitus)   . Peripheral autonomic neuropathy due to diabetes mellitus   . Leg cramps     idiopathic severe  . Smoker     1ppd  . AAA (abdominal aortic aneurysm)     with thrombosis - 6 x 4.9 cm  . Fatty liver   . Cervical neck pain with evidence of disc disease 07/2011    MRI - disk bulging and foraminal stenosis, advanced at C4/5, 5/6; rec pain management for ESI by Dr. Martha Clan   . CHF (congestive heart failure)   . Depression   . ED (erectile dysfunction) 02/2012    penile injections - failed viagra, poor arterial flow (Tannenbaum)  . OSA (obstructive sleep apnea) 03/2012    AHI 18.6, desat to 74%, severe snoring, consider ENT eval  . Right shoulder injury 05/2012    after fall out of chair, s/p injection, rec conservative management with PT Thurston Hole)    Current Outpatient Prescriptions  Medication Sig Dispense Refill  . ADVAIR DISKUS 500-50 MCG/DOSE AEPB INHALE 1  PUFF BY MOUTH TWICE DAILY  1 each  11  . albuterol (PROAIR HFA) 108 (90 BASE) MCG/ACT inhaler Inhale 2 puffs into the lungs every 6 (six) hours as needed. For cough and wheezing and SOB.  1 Inhaler  11  . Coenzyme Q10 100 MG capsule Take 1 capsule (100 mg total) by mouth daily.  30 capsule  0  . cyclobenzaprine (FLEXERIL) 10 MG tablet TAKE 1 TABLET BY MOUTH TWICE DAILY AS NEEDED FOR MUSCLE SPASMS  60 tablet  0  . diclofenac sodium (VOLTAREN) 1 % GEL Apply 1 application topically 4 (four) times daily as needed.      . doxycycline (VIBRAMYCIN) 100 MG capsule Take 100 mg by mouth 2 (two) times daily as needed.      .  fish oil-omega-3 fatty acids 1000 MG capsule Take 2 g by mouth daily.       Marland Kitchen gabapentin (NEURONTIN) 600 MG tablet Take 1 tablet (600 mg total) by mouth 3 (three) times daily.  90 tablet  11  . glucose blood (ONE TOUCH ULTRA TEST) test strip Check blood sugar once daily and as needed. 250.60  100 each  3  . Glucose Blood (ONETOUCH ULTRA BLUE VI) by In Vitro route. Test blood glucose 1 time per day and as directed.       Marland Kitchen HYDROcodone-acetaminophen (NORCO/VICODIN) 5-325 MG per tablet TAKE 1 TABLET BY MOUTH TWICE DAILY AS NEEDED FOR PAIN  62 tablet  0  . ipratropium-albuterol (DUONEB) 0.5-2.5 (3) MG/3ML SOLN Take 3 mLs by nebulization 2 (two) times daily as needed. For shortness of breath.  360 mL  6  . metFORMIN (GLUCOPHAGE) 500 MG tablet       . nitroGLYCERIN (NITROLINGUAL) 0.4 MG/SPRAY spray Place 1 spray under the tongue every 5 (five) minutes as needed.      Marland Kitchen omeprazole (PRILOSEC) 40 MG capsule TAKE ONE CAPSULE BY MOUTH DAILY  30 capsule  6  . potassium chloride (K-DUR) 10 MEQ tablet Take 2 tablets (20 mEq total) by mouth 2 (two) times daily.  120 tablet  3  . warfarin (COUMADIN) 5 MG tablet Take as directed by anticoagulation clinic  30 tablet  2  . furosemide (LASIX) 20 MG tablet Take 20 mg by mouth as needed. For fluid.        No current facility-administered medications for this visit.    Allergies:    Allergies  Allergen Reactions  . Varenicline Tartrate Other (See Comments)    REACTION: hallucinations, but on retrial did well  . Wellbutrin (Bupropion Hcl) Other (See Comments)    Hallucinations  . Zocor (Simvastatin) Other (See Comments)    Muscle pain    Social History:  The patient  reports that he has been smoking Cigarettes.  He has a 52 pack-year smoking history. He has never used smokeless tobacco. He reports that  drinks alcohol. He reports that he does not use illicit drugs.   ROS:  Please see the history of present illness.  All other systems reviewed and negative.    PHYSICAL EXAM: VS:  BP 128/85  Pulse 74  Ht 6\' 1"  (1.854 m)  Wt 264 lb (119.75 kg)  BMI 34.84 kg/m2 Well nourished, well developed, in no acute distress HEENT: normal Neck: no JVD Cardiac:  normal S1, S2; RRR; no murmur Lungs:  Distant breath sounds, no wheezing, rhonchi or rales Abd: soft, nontender, no hepatomegaly Ext: no edema Skin: warm and dry Neuro:  CNs 2-12 intact, no focal abnormalities  noted

## 2013-04-25 NOTE — Assessment & Plan Note (Signed)
Continues to smoke <1/2 PPD. Using e-cigarettes with some improvement. Declines nicotine replacement therapy. Has been on Chantix in the past and quit for a short amount of time. Will revisit this option on follow-up after GI work-up.

## 2013-04-26 DIAGNOSIS — Z79899 Other long term (current) drug therapy: Secondary | ICD-10-CM | POA: Diagnosis not present

## 2013-04-27 ENCOUNTER — Encounter: Payer: Self-pay | Admitting: Gastroenterology

## 2013-04-27 ENCOUNTER — Ambulatory Visit (INDEPENDENT_AMBULATORY_CARE_PROVIDER_SITE_OTHER): Payer: Medicare Other | Admitting: Gastroenterology

## 2013-04-27 VITALS — BP 104/78 | HR 80 | Ht 73.0 in | Wt 261.2 lb

## 2013-04-27 DIAGNOSIS — Z7901 Long term (current) use of anticoagulants: Secondary | ICD-10-CM | POA: Diagnosis not present

## 2013-04-27 DIAGNOSIS — R195 Other fecal abnormalities: Secondary | ICD-10-CM

## 2013-04-27 DIAGNOSIS — K921 Melena: Secondary | ICD-10-CM

## 2013-04-27 NOTE — Patient Instructions (Addendum)
You have been scheduled for a air contrasted Barium Enema at [facility] on 05/03/13 at 9:30am. Go to Radiology department at [facility] arriving at 9:15am.Please go by your pharmacy and pick up your preparation for your Barium Enema. We have gave you instructions on how to prepare for your Barium Enema.   Thank you for choosing me and [facility].  Venita Lick. Pleas Koch., MD., Clementeen Graham

## 2013-04-27 NOTE — Progress Notes (Signed)
History of Present Illness: This is a 67 year old male with multiple comorbidities who relates small amounts of bright red blood per rectum after he has had several loose stools. In between these episodes his bowel habits are normal. A recent rectal examination showed Hemoccult-positive stool. He is maintained on Coumadin. Denies weight loss, abdominal pain, constipation, change in stool caliber, melena, nausea, vomiting, dysphagia, reflux symptoms, chest pain.  Review of Systems: Pertinent positive and negative review of systems were noted in the above HPI section. All other review of systems were otherwise negative.  Current Medications, Allergies, Past Medical History, Past Surgical History, Family History and Social History were reviewed in [employer] record.  Physical Exam: General: Well developed , well nourished, no acute distress Head: Normocephalic and atraumatic Eyes:  sclerae anicteric, EOMI Ears: Normal auditory acuity Mouth: No deformity or lesions Neck: Supple, no masses or thyromegaly Lungs: Clear throughout to auscultation Heart: Regular rate and rhythm; no murmurs, rubs or bruits Abdomen: Soft, non tender and non distended. No masses, hepatosplenomegaly or hernias noted. Normal Bowel sounds Rectal: Recent exam showed no lesions and Hemoccult positive stool Musculoskeletal: Symmetrical with no gross deformities  Skin: No lesions on visible extremities Pulses:  Normal pulses noted Extremities: No clubbing, cyanosis, edema or deformities noted Neurological: Alert oriented x 4, grossly nonfocal Cervical Nodes:  No significant cervical adenopathy Inguinal Nodes: No significant inguinal adenopathy Psychological:  Alert and cooperative. Normal mood and affect  Assessment and Recommendations:  1. Hemoccult positive stool, intermittent small-volume hematochezia, intermittent diarrhea. Chronic Coumadin anticoagulation. Rule out a benign anorectal source of  bleeding such as hemorrhoids. Rule out colorectal neoplasms, IBD and other disorders. I recommended proceeding with colonoscopy but the patient declines as he does not want to be sedated. Well therefore schedule air-contrast barium enema.

## 2013-05-03 ENCOUNTER — Ambulatory Visit (HOSPITAL_COMMUNITY)
Admission: RE | Admit: 2013-05-03 | Discharge: 2013-05-03 | Disposition: A | Discharge status: Home / Self Care | Payer: Medicare Other | Source: Ambulatory Visit | Attending: Gastroenterology | Admitting: Gastroenterology

## 2013-05-03 DIAGNOSIS — K921 Melena: Secondary | ICD-10-CM | POA: Insufficient documentation

## 2013-05-03 DIAGNOSIS — Z7901 Long term (current) use of anticoagulants: Secondary | ICD-10-CM | POA: Diagnosis not present

## 2013-05-03 DIAGNOSIS — K5730 Diverticulosis of large intestine without perforation or abscess without bleeding: Secondary | ICD-10-CM | POA: Insufficient documentation

## 2013-05-03 DIAGNOSIS — R933 Abnormal findings on diagnostic imaging of other parts of digestive tract: Secondary | ICD-10-CM | POA: Insufficient documentation

## 2013-05-03 DIAGNOSIS — R195 Other fecal abnormalities: Secondary | ICD-10-CM

## 2013-05-04 ENCOUNTER — Other Ambulatory Visit: Payer: Self-pay | Admitting: *Deleted

## 2013-05-04 ENCOUNTER — Other Ambulatory Visit: Payer: Self-pay | Admitting: Family Medicine

## 2013-05-04 MED ORDER — POTASSIUM CHLORIDE ER 10 MEQ PO TBCR: 20.0000 meq | EXTENDED_RELEASE_TABLET | Freq: Two times a day (BID) | ORAL | Status: DC

## 2013-05-04 NOTE — Telephone Encounter (Signed)
Refilled Potassium 10 meq sent to [facility].

## 2013-05-04 NOTE — Telephone Encounter (Signed)
Last office visit 04/22/13. Is it okay to refill medication?

## 2013-05-05 NOTE — Telephone Encounter (Signed)
Rx called in as directed.   

## 2013-05-05 NOTE — Telephone Encounter (Signed)
plz phone in hydrocodone 

## 2013-05-06 ENCOUNTER — Encounter: Payer: Self-pay | Admitting: Family Medicine

## 2013-05-10 ENCOUNTER — Telehealth: Payer: Self-pay

## 2013-05-10 ENCOUNTER — Other Ambulatory Visit: Payer: Self-pay | Admitting: Physician Assistant

## 2013-05-10 NOTE — Telephone Encounter (Signed)
Ultrasound aorta-- if there's a place to add comments I'd just say AAA monitoring. Thanks -----   Message ----- From: Marcelle Overlie, RN Sent: 05/10/2013 9:41 AM To: Gery Pray, PA-C Subject: abd. u/s You mentioned an abdominal u/s to eval AAA in last office note. Is this a plain abdominal u/s or should it be a AAA duplex? Thanks!!

## 2013-05-10 NOTE — Telephone Encounter (Signed)
I spoke with pt's wife who says pt has had GI consultation and underwent testing which came back negative.  Ready to schedule abdominal u/s to eval AAA. Wife wants to discuss with pt ZO:XWRU time/day and will call me back.

## 2013-05-18 ENCOUNTER — Other Ambulatory Visit: Payer: Self-pay

## 2013-05-18 DIAGNOSIS — I714 Abdominal aortic aneurysm, without rupture: Secondary | ICD-10-CM

## 2013-05-18 NOTE — Telephone Encounter (Signed)
Scheduled for 6/20 at 1000 (cannot have PM appt d/t NPO) Wife informed Will have to r/s appt with Dr. Mariah Milling scheduled for tomm since U/S not done yet Offered appt week after 6/20 but wife states will be out of town Scheduled for 06/16/13 at 3pm Will notify pt of u/s results prior to appt

## 2013-05-18 NOTE — Telephone Encounter (Signed)
FYI See below 

## 2013-05-18 NOTE — Telephone Encounter (Signed)
Per wife pt would like this done at [facility] Would like appt as late in week and afternoon as possible I will call to schedule and call her back

## 2013-05-19 ENCOUNTER — Ambulatory Visit: Payer: Medicare Other | Admitting: Cardiovascular Disease

## 2013-05-27 ENCOUNTER — Ambulatory Visit (INDEPENDENT_AMBULATORY_CARE_PROVIDER_SITE_OTHER): Payer: Medicare Other

## 2013-05-27 ENCOUNTER — Encounter (INDEPENDENT_AMBULATORY_CARE_PROVIDER_SITE_OTHER): Payer: Medicare Other

## 2013-05-27 DIAGNOSIS — I714 Abdominal aortic aneurysm, without rupture, unspecified: Secondary | ICD-10-CM

## 2013-05-27 DIAGNOSIS — I700 Atherosclerosis of aorta: Secondary | ICD-10-CM

## 2013-05-27 DIAGNOSIS — I4891 Unspecified atrial fibrillation: Secondary | ICD-10-CM

## 2013-05-27 LAB — POCT INR: INR: 2.4

## 2013-05-27 MED ORDER — WARFARIN SODIUM 5 MG PO TABS: ORAL_TABLET | ORAL | Status: DC

## 2013-06-01 ENCOUNTER — Other Ambulatory Visit: Payer: Self-pay | Admitting: Family Medicine

## 2013-06-01 NOTE — Telephone Encounter (Signed)
plz phone in. 

## 2013-06-01 NOTE — Telephone Encounter (Signed)
Ok to refill 

## 2013-06-01 NOTE — Telephone Encounter (Signed)
Called into pharmacy

## 2013-06-13 ENCOUNTER — Other Ambulatory Visit: Payer: Self-pay | Admitting: Cardiovascular Disease

## 2013-06-13 MED ORDER — WARFARIN SODIUM 5 MG PO TABS: ORAL_TABLET | ORAL | Status: DC

## 2013-06-13 NOTE — Telephone Encounter (Signed)
Called pharmacy and did refill as directed

## 2013-06-13 NOTE — Telephone Encounter (Signed)
Please review for refill.  

## 2013-06-16 ENCOUNTER — Encounter: Payer: Self-pay | Admitting: Cardiovascular Disease

## 2013-06-16 ENCOUNTER — Other Ambulatory Visit: Payer: Self-pay

## 2013-06-16 ENCOUNTER — Ambulatory Visit (INDEPENDENT_AMBULATORY_CARE_PROVIDER_SITE_OTHER): Payer: Medicare Other | Admitting: Cardiovascular Disease

## 2013-06-16 VITALS — BP 112/82 | HR 66 | Ht 73.0 in | Wt 266.5 lb

## 2013-06-16 DIAGNOSIS — E785 Hyperlipidemia, unspecified: Secondary | ICD-10-CM | POA: Diagnosis not present

## 2013-06-16 DIAGNOSIS — I714 Abdominal aortic aneurysm, without rupture: Secondary | ICD-10-CM | POA: Diagnosis not present

## 2013-06-16 DIAGNOSIS — I739 Peripheral vascular disease, unspecified: Secondary | ICD-10-CM | POA: Diagnosis not present

## 2013-06-16 DIAGNOSIS — I480 Paroxysmal atrial fibrillation: Secondary | ICD-10-CM

## 2013-06-16 DIAGNOSIS — Z720 Tobacco use: Secondary | ICD-10-CM

## 2013-06-16 DIAGNOSIS — I10 Essential (primary) hypertension: Secondary | ICD-10-CM

## 2013-06-16 DIAGNOSIS — I4891 Unspecified atrial fibrillation: Secondary | ICD-10-CM

## 2013-06-16 DIAGNOSIS — F17200 Nicotine dependence, unspecified, uncomplicated: Secondary | ICD-10-CM

## 2013-06-16 MED ORDER — AMIODARONE HCL 200 MG PO TABS: 200.0000 mg | ORAL_TABLET | Freq: Two times a day (BID) | ORAL | Status: DC

## 2013-06-16 MED ORDER — DIGOXIN 125 MCG PO TABS: 0.1250 mg | ORAL_TABLET | Freq: Every day | ORAL | Status: DC

## 2013-06-16 NOTE — Assessment & Plan Note (Signed)
We have encouraged him to continue to work on weaning his cigarettes and smoking cessation. He will continue to work on this and does not want any assistance with chantix.  
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
## 2013-06-16 NOTE — Assessment & Plan Note (Addendum)
Was initially in normal sinus rhythm on arrival, converted to atrial fibrillation after going to the bathroom during his visit. We started him on low-dose digoxin 0.125 mg daily with amiodarone 200 mg twice a day. Amiodarone can be decreased down to 200 mg daily if he converts back to normal sinus rhythm. He is on warfarin. He is asymptomatic and does not appreciate his atrial fibrillation. Blood pressure is low. Unable to add metoprolol or diltiazem.

## 2013-06-16 NOTE — Assessment & Plan Note (Signed)
We'll start low-dose pravastatin after discussion with him, 10 mg titrating up to 20 mg daily.

## 2013-06-16 NOTE — Patient Instructions (Addendum)
We will check your lipids in the next week or so.  Please start digoxin one a day Start amiodarone twice a day for atrial fibrillation  Please call us if you have new issues that need to be addressed before your next appt.  Your physician wants you to follow-up in: 3 months.  You will receive a reminder letter in the mail two months in advance. If you don't receive a letter, please call our office to schedule the follow-up appointment.

## 2013-06-16 NOTE — Assessment & Plan Note (Signed)
Blood pressure is well controlled on today's visit. No changes made to the medications. 

## 2013-06-16 NOTE — Assessment & Plan Note (Signed)
We will send a message to Dr. Edilia Bo concerning his AAA repair. He may need an abdominal CT and followup.

## 2013-06-18 ENCOUNTER — Other Ambulatory Visit: Payer: Self-pay | Admitting: Family Medicine

## 2013-06-29 ENCOUNTER — Telehealth: Payer: Self-pay | Admitting: *Deleted

## 2013-06-29 ENCOUNTER — Other Ambulatory Visit: Payer: Self-pay | Admitting: Family Medicine

## 2013-06-29 DIAGNOSIS — I714 Abdominal aortic aneurysm, without rupture: Secondary | ICD-10-CM

## 2013-06-29 NOTE — Telephone Encounter (Signed)
Ok to refill 

## 2013-06-29 NOTE — Telephone Encounter (Signed)
I spoke with pts wife she states he is feeling fine & is asleep at this time.  She states pt told her he thought his heart rate was up around 3 or 4 am this morning as noted below ( 125/61 HR 117). This was before medication of amiodarone & digoxin. Pt usually works at night.  States that he did not have any shortness of breath or chest discomfort at that time. But is feeling better now.  I will forward to Dr. Mariah Milling.  Mylo Red RN

## 2013-06-29 NOTE — Telephone Encounter (Signed)
Patient called re: blood pressure 125/61 hr 117. Please call patient

## 2013-06-30 NOTE — Telephone Encounter (Signed)
Would continue on current meds for now, Will likely continue to have rare epsiodes of atrial fib. continue warfarin  We need to schedule an abd. 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 Rubye Oaks, vascular surg for follow up of CT scan

## 2013-07-01 ENCOUNTER — Other Ambulatory Visit: Payer: Self-pay | Admitting: Family Medicine

## 2013-07-01 NOTE — Telephone Encounter (Signed)
Ok to refill 

## 2013-07-01 NOTE — Telephone Encounter (Signed)
plz phone in. 

## 2013-07-01 NOTE — Addendum Note (Signed)
Addended by: Lisabeth Devoid F on: 07/01/2013 11:47 AM   Modules accepted: Orders

## 2013-07-01 NOTE — Telephone Encounter (Signed)
Rx called in as directed.   

## 2013-07-02 ENCOUNTER — Other Ambulatory Visit: Payer: Self-pay | Admitting: Family Medicine

## 2013-07-05 ENCOUNTER — Other Ambulatory Visit: Payer: Self-pay | Admitting: *Deleted

## 2013-07-05 DIAGNOSIS — I10 Essential (primary) hypertension: Secondary | ICD-10-CM

## 2013-07-06 ENCOUNTER — Ambulatory Visit (INDEPENDENT_AMBULATORY_CARE_PROVIDER_SITE_OTHER): Payer: Medicare Other | Admitting: *Deleted

## 2013-07-06 DIAGNOSIS — I4891 Unspecified atrial fibrillation: Secondary | ICD-10-CM

## 2013-07-06 LAB — POCT INR: INR: 2

## 2013-07-08 ENCOUNTER — Other Ambulatory Visit: Payer: Medicare Other

## 2013-07-15 ENCOUNTER — Inpatient Hospital Stay: Admission: RE | Admit: 2013-07-15 | Payer: Medicare Other | Source: Ambulatory Visit

## 2013-07-17 ENCOUNTER — Other Ambulatory Visit: Payer: Self-pay | Admitting: Family Medicine

## 2013-07-20 ENCOUNTER — Ambulatory Visit (INDEPENDENT_AMBULATORY_CARE_PROVIDER_SITE_OTHER): Payer: Medicare Other

## 2013-07-20 DIAGNOSIS — I4891 Unspecified atrial fibrillation: Secondary | ICD-10-CM

## 2013-07-20 LAB — POCT INR: INR: 2.4

## 2013-08-01 ENCOUNTER — Ambulatory Visit: Payer: Medicare Other | Admitting: Family Medicine

## 2013-08-04 ENCOUNTER — Other Ambulatory Visit: Payer: Self-pay | Admitting: Family Medicine

## 2013-08-04 NOTE — Telephone Encounter (Signed)
Ok to refill 

## 2013-08-05 NOTE — Telephone Encounter (Signed)
Refill on norco called to [facility].

## 2013-08-05 NOTE — Telephone Encounter (Signed)
plz phone in hydrocodone 

## 2013-08-10 ENCOUNTER — Ambulatory Visit (INDEPENDENT_AMBULATORY_CARE_PROVIDER_SITE_OTHER): Payer: Medicare Other

## 2013-08-10 DIAGNOSIS — I4891 Unspecified atrial fibrillation: Secondary | ICD-10-CM

## 2013-08-10 LAB — POCT INR: INR: 2.8

## 2013-08-27 ENCOUNTER — Other Ambulatory Visit: Payer: Self-pay | Admitting: Family Medicine

## 2013-08-29 ENCOUNTER — Other Ambulatory Visit: Payer: Self-pay | Admitting: Family Medicine

## 2013-08-29 NOTE — Telephone Encounter (Signed)
Please call in hydrocodone.  Thanks.

## 2013-08-29 NOTE — Telephone Encounter (Signed)
Ok to refill in Dr. Timoteo Expose absence? Last filled 08/04/13.

## 2013-08-29 NOTE — Telephone Encounter (Signed)
Rx called in as directed.   

## 2013-09-07 ENCOUNTER — Ambulatory Visit (INDEPENDENT_AMBULATORY_CARE_PROVIDER_SITE_OTHER): Payer: Medicare Other | Admitting: General Practice

## 2013-09-07 DIAGNOSIS — I4891 Unspecified atrial fibrillation: Secondary | ICD-10-CM | POA: Diagnosis not present

## 2013-09-10 ENCOUNTER — Other Ambulatory Visit: Payer: Self-pay | Admitting: Cardiovascular Disease

## 2013-09-10 ENCOUNTER — Other Ambulatory Visit: Payer: Self-pay | Admitting: Physician Assistant

## 2013-09-12 NOTE — Telephone Encounter (Signed)
Please review and refill, Thank You. 

## 2013-09-14 ENCOUNTER — Ambulatory Visit (INDEPENDENT_AMBULATORY_CARE_PROVIDER_SITE_OTHER): Payer: Medicare Other | Admitting: General Practice

## 2013-09-14 DIAGNOSIS — I4891 Unspecified atrial fibrillation: Secondary | ICD-10-CM | POA: Diagnosis not present

## 2013-09-15 DIAGNOSIS — J029 Acute pharyngitis, unspecified: Secondary | ICD-10-CM | POA: Diagnosis not present

## 2013-10-05 ENCOUNTER — Ambulatory Visit (INDEPENDENT_AMBULATORY_CARE_PROVIDER_SITE_OTHER): Payer: Medicare Other | Admitting: General Practice

## 2013-10-05 DIAGNOSIS — I4891 Unspecified atrial fibrillation: Secondary | ICD-10-CM

## 2013-10-07 ENCOUNTER — Other Ambulatory Visit: Payer: Self-pay | Admitting: Family Medicine

## 2013-10-07 MED ORDER — HYDROCODONE-ACETAMINOPHEN 5-325 MG PO TABS: ORAL_TABLET | ORAL | Status: DC

## 2013-10-07 NOTE — Telephone Encounter (Signed)
Printed and placed in Kim's box. 

## 2013-10-07 NOTE — Telephone Encounter (Signed)
Patient's wife notified and Rx placed up front for pick up. 

## 2013-10-07 NOTE — Telephone Encounter (Signed)
[name] left v/m requesting rx hydrocodone apap. Pt is out of med. Call when ready for pickup.

## 2013-10-07 NOTE — Telephone Encounter (Signed)
Ok to refill 

## 2013-10-11 ENCOUNTER — Other Ambulatory Visit: Payer: Self-pay | Admitting: Family Medicine

## 2013-10-13 ENCOUNTER — Other Ambulatory Visit: Payer: Self-pay

## 2013-10-16 ENCOUNTER — Other Ambulatory Visit: Payer: Self-pay | Admitting: Family Medicine

## 2013-10-25 ENCOUNTER — Ambulatory Visit (INDEPENDENT_AMBULATORY_CARE_PROVIDER_SITE_OTHER): Payer: Medicare Other | Admitting: General Practice

## 2013-10-25 ENCOUNTER — Ambulatory Visit (INDEPENDENT_AMBULATORY_CARE_PROVIDER_SITE_OTHER): Payer: Medicare Other | Admitting: Cardiovascular Disease

## 2013-10-25 ENCOUNTER — Encounter: Payer: Self-pay | Admitting: Cardiovascular Disease

## 2013-10-25 VITALS — BP 122/68 | HR 50 | Ht 73.0 in | Wt 246.8 lb

## 2013-10-25 DIAGNOSIS — I739 Peripheral vascular disease, unspecified: Secondary | ICD-10-CM | POA: Diagnosis not present

## 2013-10-25 DIAGNOSIS — E785 Hyperlipidemia, unspecified: Secondary | ICD-10-CM | POA: Diagnosis not present

## 2013-10-25 DIAGNOSIS — I714 Abdominal aortic aneurysm, without rupture, unspecified: Secondary | ICD-10-CM

## 2013-10-25 DIAGNOSIS — R0602 Shortness of breath: Secondary | ICD-10-CM | POA: Diagnosis not present

## 2013-10-25 DIAGNOSIS — I498 Other specified cardiac arrhythmias: Secondary | ICD-10-CM | POA: Diagnosis not present

## 2013-10-25 DIAGNOSIS — Z720 Tobacco use: Secondary | ICD-10-CM

## 2013-10-25 DIAGNOSIS — F17200 Nicotine dependence, unspecified, uncomplicated: Secondary | ICD-10-CM

## 2013-10-25 DIAGNOSIS — R001 Bradycardia, unspecified: Secondary | ICD-10-CM

## 2013-10-25 DIAGNOSIS — I4891 Unspecified atrial fibrillation: Secondary | ICD-10-CM

## 2013-10-25 DIAGNOSIS — E1149 Type 2 diabetes mellitus with other diabetic neurological complication: Secondary | ICD-10-CM

## 2013-10-25 LAB — POCT INR: INR: 3.1

## 2013-10-25 NOTE — Assessment & Plan Note (Signed)
He reports having nightmares on Chantix. Discussed smoking cessation options with him. He will try vapor cigarettes and patches

## 2013-10-25 NOTE — Assessment & Plan Note (Signed)
Maintaining normal sinus rhythm. Sinus bradycardia noted today and he is symptomatic. We'll decrease amiodarone down to 200 mg daily, continue low-dose digoxin. If he continues to have symptomatic bradycardia, we could hold the digoxin

## 2013-10-25 NOTE — Assessment & Plan Note (Signed)
We have encouraged continued exercise, careful diet management in an effort to lose weight. Encouraged him to continue his weight loss . He has had 20 pound weight loss since July 2014

## 2013-10-25 NOTE — Patient Instructions (Addendum)
You are doing well. Please hold the evening amiodarone for slow heart rate Continue to monitor your heart rate If you continue to run slow, call the office  We will schedule you for an ultrasound of your aneurysm  We can do labs the same morning   Please call us if you have new issues that need to be addressed before your next appt.  Your physician wants you to follow-up in: 6 months.  You will receive a reminder letter in the mail two months in advance. If you don't receive a letter, please call our office to schedule the follow-up appointment.
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
## 2013-10-25 NOTE — Assessment & Plan Note (Signed)
We have suggested he have repeat lipid panel at his convenience before discussing any medications

## 2013-10-25 NOTE — Assessment & Plan Note (Signed)
Repeat ultrasound ordered for routine surveillance

## 2013-10-31 ENCOUNTER — Telehealth: Payer: Self-pay | Admitting: Cardiovascular Disease

## 2013-10-31 NOTE — Telephone Encounter (Signed)
Would hold off on repeat ABD ultrasound scheduled for tomorrow On re-review of u/s from  05/2013,  Would plan on CT ABD as there appears to be a region of aneurysm that is dilated (not stented), 5 cm. Will need a CT ABD as this can be hard to see.   Or does he want to follow up with one of the vascular doctors in [place] (or [place])

## 2013-11-01 ENCOUNTER — Other Ambulatory Visit: Payer: Medicare Other

## 2013-11-01 NOTE — Telephone Encounter (Signed)
Spoke w/ pt's wife.  She will notify pt that per Dr. Mariah Milling, u/s for today has been cancelled and to have him call the office to schedule CT.

## 2013-11-07 ENCOUNTER — Telehealth: Payer: Self-pay

## 2013-11-07 ENCOUNTER — Other Ambulatory Visit: Payer: Self-pay | Admitting: Family Medicine

## 2013-11-07 DIAGNOSIS — I714 Abdominal aortic aneurysm, without rupture: Secondary | ICD-10-CM

## 2013-11-07 MED ORDER — HYDROCODONE-ACETAMINOPHEN 5-325 MG PO TABS: ORAL_TABLET | ORAL | Status: DC

## 2013-11-07 NOTE — Telephone Encounter (Signed)
error 

## 2013-11-07 NOTE — Telephone Encounter (Signed)
Ok to refill 

## 2013-11-07 NOTE — Telephone Encounter (Signed)
[name] Leath left v/m requesting rx hydrocodone apap. Call when ready for pick up.  

## 2013-11-07 NOTE — Telephone Encounter (Signed)
Left detailed message on pt's machine for pt to call back to schedule CT.

## 2013-11-07 NOTE — Telephone Encounter (Signed)
3rd attempt to contact pt regarding scheduling CT or setting up appt w/ vascular.

## 2013-11-07 NOTE — Telephone Encounter (Signed)
Printed and placed in Kim's box. 

## 2013-11-07 NOTE — Telephone Encounter (Signed)
Spoke w/ pt's wife. Informed her that CTA abdomen is sched at [facility] [address] [facility] on 11/18/13. She verbalizes understanding that pt is not to take metformin morning of test that that he is to call to come in to have blood drawn for bmp before test, as they need updated creatinine level.

## 2013-11-08 NOTE — Telephone Encounter (Signed)
Message left notifying patient. Rx placed up front for pick up. Advised to bring ID. 

## 2013-11-16 ENCOUNTER — Telehealth: Payer: Self-pay

## 2013-11-16 NOTE — Telephone Encounter (Signed)
Pt wife called and needs to r/s pt CTA, pt is out of town.

## 2013-11-16 NOTE — Telephone Encounter (Signed)
Left message for [name] to call back.   
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
## 2013-11-29 ENCOUNTER — Other Ambulatory Visit: Payer: Self-pay

## 2013-11-29 ENCOUNTER — Ambulatory Visit (INDEPENDENT_AMBULATORY_CARE_PROVIDER_SITE_OTHER): Payer: Medicare Other | Admitting: *Deleted

## 2013-11-29 DIAGNOSIS — I4891 Unspecified atrial fibrillation: Secondary | ICD-10-CM | POA: Diagnosis not present

## 2013-11-29 DIAGNOSIS — I714 Abdominal aortic aneurysm, without rupture: Secondary | ICD-10-CM
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
## 2013-12-07 ENCOUNTER — Other Ambulatory Visit: Payer: Self-pay

## 2013-12-07 NOTE — Telephone Encounter (Signed)
Cathleen pts wife left v/m requesting rx hydrocodone apap. Call when ready for pick up.

## 2013-12-09 MED ORDER — HYDROCODONE-ACETAMINOPHEN 5-325 MG PO TABS: ORAL_TABLET | ORAL | Status: DC

## 2013-12-09 NOTE — Telephone Encounter (Signed)
Wife notified Rx ready for pick up 

## 2013-12-09 NOTE — Telephone Encounter (Signed)
Px printed for pick up in IN box  

## 2013-12-20 ENCOUNTER — Ambulatory Visit: Payer: Self-pay | Admitting: Vascular Surgery

## 2013-12-20 DIAGNOSIS — I714 Abdominal aortic aneurysm, without rupture, unspecified: Secondary | ICD-10-CM | POA: Diagnosis not present

## 2013-12-20 DIAGNOSIS — I712 Thoracic aortic aneurysm, without rupture, unspecified: Secondary | ICD-10-CM | POA: Diagnosis not present

## 2013-12-20 DIAGNOSIS — K5730 Diverticulosis of large intestine without perforation or abscess without bleeding: Secondary | ICD-10-CM | POA: Diagnosis not present

## 2013-12-20 DIAGNOSIS — N400 Enlarged prostate without lower urinary tract symptoms: Secondary | ICD-10-CM | POA: Diagnosis not present

## 2013-12-20 DIAGNOSIS — J438 Other emphysema: Secondary | ICD-10-CM | POA: Diagnosis not present

## 2013-12-20 DIAGNOSIS — I723 Aneurysm of iliac artery: Secondary | ICD-10-CM | POA: Diagnosis not present

## 2013-12-27 DIAGNOSIS — F17200 Nicotine dependence, unspecified, uncomplicated: Secondary | ICD-10-CM | POA: Diagnosis not present

## 2013-12-27 DIAGNOSIS — J449 Chronic obstructive pulmonary disease, unspecified: Secondary | ICD-10-CM | POA: Diagnosis not present

## 2013-12-27 DIAGNOSIS — E119 Type 2 diabetes mellitus without complications: Secondary | ICD-10-CM | POA: Diagnosis not present

## 2013-12-27 DIAGNOSIS — I714 Abdominal aortic aneurysm, without rupture, unspecified: Secondary | ICD-10-CM | POA: Diagnosis not present

## 2013-12-30 ENCOUNTER — Encounter: Payer: Self-pay | Admitting: Family Medicine

## 2014-01-05 ENCOUNTER — Other Ambulatory Visit: Payer: Self-pay | Admitting: Family Medicine

## 2014-01-05 MED ORDER — HYDROCODONE-ACETAMINOPHEN 5-325 MG PO TABS: ORAL_TABLET | ORAL | Status: DC

## 2014-01-05 NOTE — Telephone Encounter (Signed)
Ok to refill 

## 2014-01-05 NOTE — Telephone Encounter (Signed)
Patient notified and Rx placed up front for pick up. 

## 2014-01-05 NOTE — Telephone Encounter (Signed)
printed and placed in Kims' box. 

## 2014-01-05 NOTE — Telephone Encounter (Signed)
Pt is needing refill on Hydrocodone. Please advise

## 2014-01-06 DIAGNOSIS — I714 Abdominal aortic aneurysm, without rupture, unspecified: Secondary | ICD-10-CM | POA: Diagnosis not present

## 2014-01-06 DIAGNOSIS — R918 Other nonspecific abnormal finding of lung field: Secondary | ICD-10-CM | POA: Diagnosis not present

## 2014-01-06 DIAGNOSIS — K5730 Diverticulosis of large intestine without perforation or abscess without bleeding: Secondary | ICD-10-CM | POA: Diagnosis not present

## 2014-01-09 DIAGNOSIS — I716 Thoracoabdominal aortic aneurysm, without rupture, unspecified: Secondary | ICD-10-CM | POA: Diagnosis not present

## 2014-01-14 ENCOUNTER — Other Ambulatory Visit: Payer: Self-pay | Admitting: Cardiovascular Disease

## 2014-01-16 ENCOUNTER — Other Ambulatory Visit: Payer: Self-pay | Admitting: *Deleted

## 2014-01-16 NOTE — Telephone Encounter (Signed)
Error

## 2014-01-16 NOTE — Telephone Encounter (Signed)
Please review for refill.  

## 2014-01-16 NOTE — Telephone Encounter (Signed)
Please review and refil, Thank You.

## 2014-01-18 ENCOUNTER — Ambulatory Visit (INDEPENDENT_AMBULATORY_CARE_PROVIDER_SITE_OTHER): Payer: Medicare Other | Admitting: Pharmacist

## 2014-01-18 DIAGNOSIS — I4891 Unspecified atrial fibrillation: Secondary | ICD-10-CM

## 2014-01-18 LAB — POCT INR: INR: 3.1

## 2014-02-10 ENCOUNTER — Other Ambulatory Visit: Payer: Self-pay

## 2014-02-10 MED ORDER — HYDROCODONE-ACETAMINOPHEN 5-325 MG PO TABS: ORAL_TABLET | ORAL | Status: DC

## 2014-02-10 NOTE — Telephone Encounter (Signed)
[name] left v/m requesting rx hydrocodone apap. Call when ready for pick up. 

## 2014-02-10 NOTE — Telephone Encounter (Signed)
Pt notified RX ready to be picked up, placed at front desk.

## 2014-02-10 NOTE — Telephone Encounter (Signed)
Printed and placed in Kim's box. 

## 2014-02-15 ENCOUNTER — Ambulatory Visit (INDEPENDENT_AMBULATORY_CARE_PROVIDER_SITE_OTHER): Payer: Medicare Other

## 2014-02-15 DIAGNOSIS — I4891 Unspecified atrial fibrillation: Secondary | ICD-10-CM | POA: Diagnosis not present

## 2014-02-15 LAB — POCT INR: INR: 4.1

## 2014-02-16 ENCOUNTER — Other Ambulatory Visit: Payer: Self-pay | Admitting: Cardiovascular Disease

## 2014-02-16 NOTE — Telephone Encounter (Signed)
lmtcb regarding refill on amiodarone.

## 2014-02-24 ENCOUNTER — Other Ambulatory Visit: Payer: Self-pay | Admitting: Cardiovascular Disease

## 2014-03-02 ENCOUNTER — Other Ambulatory Visit: Payer: Self-pay | Admitting: Cardiovascular Disease

## 2014-03-03 NOTE — Telephone Encounter (Signed)
Can you review for refill. Thanks!  

## 2014-03-15 ENCOUNTER — Ambulatory Visit (INDEPENDENT_AMBULATORY_CARE_PROVIDER_SITE_OTHER): Payer: Medicare Other

## 2014-03-15 DIAGNOSIS — I4891 Unspecified atrial fibrillation: Secondary | ICD-10-CM

## 2014-03-15 LAB — POCT INR: INR: 1.6

## 2014-03-16 ENCOUNTER — Other Ambulatory Visit: Payer: Self-pay

## 2014-03-17 ENCOUNTER — Other Ambulatory Visit: Payer: Self-pay

## 2014-03-17 NOTE — Telephone Encounter (Signed)
[name] left v/m requesting rx hydrocodone apap. Call when ready for pick up. 

## 2014-03-20 MED ORDER — HYDROCODONE-ACETAMINOPHEN 5-325 MG PO TABS: ORAL_TABLET | ORAL | Status: DC

## 2014-03-20 NOTE — Telephone Encounter (Signed)
Message left notifying patient. Rx placed up front for pick up. 

## 2014-03-20 NOTE — Telephone Encounter (Signed)
Printed and placed in Kim's box. 

## 2014-03-24 ENCOUNTER — Other Ambulatory Visit: Payer: Self-pay | Admitting: Family Medicine

## 2014-03-24 NOTE — Telephone Encounter (Signed)
Please refill for 1 mo in PCP absence ,thanks

## 2014-03-27 ENCOUNTER — Other Ambulatory Visit: Payer: Self-pay | Admitting: Cardiovascular Disease

## 2014-03-29 ENCOUNTER — Ambulatory Visit (INDEPENDENT_AMBULATORY_CARE_PROVIDER_SITE_OTHER): Payer: Medicare Other

## 2014-03-29 DIAGNOSIS — I4891 Unspecified atrial fibrillation: Secondary | ICD-10-CM | POA: Diagnosis not present

## 2014-03-29 LAB — POCT INR: INR: 2.8

## 2014-04-26 ENCOUNTER — Ambulatory Visit (INDEPENDENT_AMBULATORY_CARE_PROVIDER_SITE_OTHER): Payer: Medicare Other

## 2014-04-26 DIAGNOSIS — I4891 Unspecified atrial fibrillation: Secondary | ICD-10-CM

## 2014-04-26 LAB — POCT INR: INR: 1.8

## 2014-05-05 ENCOUNTER — Other Ambulatory Visit: Payer: Self-pay | Admitting: Family Medicine

## 2014-05-05 ENCOUNTER — Other Ambulatory Visit: Payer: Self-pay | Admitting: Cardiovascular Disease

## 2014-05-05 MED ORDER — HYDROCODONE-ACETAMINOPHEN 5-325 MG PO TABS: ORAL_TABLET | ORAL | Status: DC

## 2014-05-05 NOTE — Telephone Encounter (Signed)
Please review for refill, Thank you. 

## 2014-05-05 NOTE — Telephone Encounter (Signed)
Printed and placed in Kim's box. Will need f/u OV.

## 2014-05-05 NOTE — Telephone Encounter (Signed)
[name] left v/m requesting rx hydrocodone apap. Call when ready for pick up. Last OV 04/22/13; no future appt scheduled except coumadin clinic.

## 2014-05-08 NOTE — Telephone Encounter (Signed)
Patient's wife notified and Rx placed up front for pick up. 

## 2014-05-11 ENCOUNTER — Other Ambulatory Visit: Payer: Self-pay | Admitting: Cardiovascular Disease

## 2014-05-11 ENCOUNTER — Ambulatory Visit (INDEPENDENT_AMBULATORY_CARE_PROVIDER_SITE_OTHER): Payer: Medicare Other | Admitting: Family Medicine

## 2014-05-11 ENCOUNTER — Encounter: Payer: Self-pay | Admitting: Family Medicine

## 2014-05-11 VITALS — BP 134/70 | HR 56 | Temp 98.2°F | Wt 256.8 lb

## 2014-05-11 DIAGNOSIS — R0981 Nasal congestion: Secondary | ICD-10-CM

## 2014-05-11 DIAGNOSIS — Z720 Tobacco use: Secondary | ICD-10-CM

## 2014-05-11 DIAGNOSIS — J3489 Other specified disorders of nose and nasal sinuses: Secondary | ICD-10-CM | POA: Diagnosis not present

## 2014-05-11 DIAGNOSIS — F17200 Nicotine dependence, unspecified, uncomplicated: Secondary | ICD-10-CM | POA: Diagnosis not present

## 2014-05-11 MED ORDER — VARENICLINE TARTRATE 1 MG PO TABS: 1.0000 mg | ORAL_TABLET | Freq: Two times a day (BID) | ORAL | Status: DC

## 2014-05-11 MED ORDER — FLUTICASONE PROPIONATE 50 MCG/ACT NA SUSP: 2.0000 | Freq: Every day | NASAL | Status: DC

## 2014-05-11 MED ORDER — MUPIROCIN CALCIUM 2 % EX CREA: 1.0000 "application " | TOPICAL_CREAM | Freq: Three times a day (TID) | CUTANEOUS | Status: DC

## 2014-05-11 MED ORDER — VARENICLINE TARTRATE 0.5 MG X 11 & 1 MG X 42 PO MISC: ORAL | Status: DC

## 2014-05-11 MED ORDER — OMEPRAZOLE 40 MG PO CPDR: DELAYED_RELEASE_CAPSULE | ORAL | Status: DC

## 2014-05-11 NOTE — Assessment & Plan Note (Signed)
rec against abx course (which pt requested). Will do trial of INS - flonase sent to pharmacy.

## 2014-05-11 NOTE — Patient Instructions (Addendum)
Treat nose with mupirocin cream Omeprazole refilled. Try chantix again. Let us know if not tolerated. Return at your convenience for fasting blood work and afterwards for physical.

## 2014-05-11 NOTE — Assessment & Plan Note (Signed)
Retrial of chantix.

## 2014-05-11 NOTE — Assessment & Plan Note (Signed)
Small scab L inner nare - treat with mupirocin to cover for MRSA infx.

## 2014-05-11 NOTE — Progress Notes (Signed)
Pre visit review using our clinic review tool, if applicable. No additional management support is needed unless otherwise documented below in the visit note. 

## 2014-05-11 NOTE — Telephone Encounter (Signed)
Pt needs to make a follow up appt.

## 2014-05-11 NOTE — Progress Notes (Signed)
BP 134/70  Pulse 56  Temp(Src) 98.2 F (36.8 C) (Oral)  Wt 256 lb 12 oz (116.461 kg)   CC: nose sore  Subjective:    Patient ID: [name], male    DOB: [birth year], 68 y.o.   MRN: [id number]  HPI: SHERLEY LESER is a 68 y.o. male presenting on 05/11/2014 for Bumps in nose   Something on inside of R nare for last 1 week.  Very sore.  Partner thinks it might have been a polyp. No bleeding, no discharge, no erythema or fluctuance or induration. More congestion these last few nights.  Requests refill of omeprazole and refill of chantix.  Relevant past medical, surgical, family and social history reviewed and updated as indicated.  Allergies and medications reviewed and updated. Current Outpatient Prescriptions on File Prior to Visit  Medication Sig  . ADVAIR DISKUS 500-50 MCG/DOSE AEPB INHALE 1 PUFF BY MOUTH TWICE DAILY  . amiodarone (PACERONE) 200 MG tablet TAKE 1 TABLET BY MOUTH TWICE DAILY  . Coenzyme Q10 100 MG capsule Take 1 capsule (100 mg total) by mouth daily.  . cyclobenzaprine (FLEXERIL) 10 MG tablet TAKE 1 TABLET BY MOUTH TWICE DAILY AS NEEDED FOR MUSCLE SPASMS  . diclofenac sodium (VOLTAREN) 1 % GEL Apply 1 application topically 4 (four) times daily as needed.  . digoxin (LANOXIN) 0.125 MG tablet Take 1 tablet (0.125 mg total) by mouth daily.  Marland Kitchen doxycycline (VIBRAMYCIN) 100 MG capsule Take 100 mg by mouth 2 (two) times daily as needed.  . fish oil-omega-3 fatty acids 1000 MG capsule Take 2 g by mouth daily.   . furosemide (LASIX) 20 MG tablet Take 20 mg by mouth as needed. For fluid.   Marland Kitchen gabapentin (NEURONTIN) 600 MG tablet TAKE 1 TABLET BY MOUTH THREE TIMES DAILY  . glucose blood (ONE TOUCH ULTRA TEST) test strip Check blood sugar once daily and as needed. 250.60  . Glucose Blood (ONETOUCH ULTRA BLUE VI) by In Vitro route. Test blood glucose 1 time per day and as directed.   Marland Kitchen HYDROcodone-acetaminophen (NORCO/VICODIN) 5-325 MG per tablet TAKE 1 TABLET BY MOUTH TWICE  DAILY AS NEEDED FOR PAIN  . ipratropium-albuterol (DUONEB) 0.5-2.5 (3) MG/3ML SOLN Take 3 mLs by nebulization 2 (two) times daily as needed. For shortness of breath.  . metFORMIN (GLUCOPHAGE) 500 MG tablet TAKE 1 TABLET BY MOUTH EVERY MORNING AND 2 TABLETS BY MOUTH EVERY EVENING  . nitroGLYCERIN (NITROLINGUAL) 0.4 MG/SPRAY spray Place 1 spray under the tongue every 5 (five) minutes as needed.  . VENTOLIN HFA 108 (90 BASE) MCG/ACT inhaler INHALE 2 PUFFS BY MOUTH EVERY 6 HOURS AS NEEDED FOR COUGH, WHEEZING AND SHORTNESS OF BREATH  . warfarin (COUMADIN) 5 MG tablet TAKE AS DIRECTED BY ANTICOAGULATION CLINIC   No current facility-administered medications on file prior to visit.    Review of Systems Per HPI unless specifically indicated above    Objective:    BP 134/70  Pulse 56  Temp(Src) 98.2 F (36.8 C) (Oral)  Wt 256 lb 12 oz (116.461 kg)  Physical Exam  Nursing note and vitals reviewed. Constitutional: He appears well-developed and well-nourished. No distress.  HENT:  Nose: Mucosal edema present. No rhinorrhea.  Mouth/Throat: Oropharynx is clear and moist. No oropharyngeal exudate.  Dried pustule L inner nare No lesions seen on right nare Congested nasal mucosa  Lymphadenopathy:    He has no cervical adenopathy.   Results for orders placed in visit on 04/26/14  POCT INR  Result Value Ref Range   INR 1.8        Assessment & Plan:   Problem List Items Addressed This Visit   Tobacco abuse     Retrial of chantix.    Relevant Medications      varenicline (CHANTIX) tablet      varenicline (CHANTIX STARTING MONTH PAK) 0.5 MG X 11 & 1 MG X 42 tablet   Nasal sore - Primary     Small scab L inner nare - treat with mupirocin to cover for MRSA infx.    Nasal congestion     rec against abx course (which pt requested). Will do trial of INS - flonase sent to pharmacy.        Follow up plan: No Follow-up on file.

## 2014-05-31 ENCOUNTER — Ambulatory Visit: Payer: Medicare Other | Admitting: Cardiovascular Disease

## 2014-06-09 ENCOUNTER — Other Ambulatory Visit: Payer: Self-pay | Admitting: Family Medicine

## 2014-06-09 ENCOUNTER — Other Ambulatory Visit: Payer: Self-pay | Admitting: Cardiovascular Disease

## 2014-06-13 ENCOUNTER — Other Ambulatory Visit: Payer: Self-pay

## 2014-06-13 MED ORDER — HYDROCODONE-ACETAMINOPHEN 5-325 MG PO TABS: ORAL_TABLET | ORAL | Status: DC

## 2014-06-13 NOTE — Telephone Encounter (Signed)
Printed and placed in Kim's box. 

## 2014-06-13 NOTE — Telephone Encounter (Signed)
Pt's wife left v/m requesting rx hydrocodone apap. Call when ready for pick up.  

## 2014-06-14 NOTE — Telephone Encounter (Signed)
Message left notifying patient's wife and Rx placed up front for pick up. 

## 2014-06-15 ENCOUNTER — Ambulatory Visit: Payer: Medicare Other | Admitting: Cardiovascular Disease

## 2014-06-16 ENCOUNTER — Telehealth: Payer: Self-pay | Admitting: *Deleted

## 2014-06-16 NOTE — Telephone Encounter (Signed)
**  DIABETIC BUNDLE**  Left voicemail letting pt know he is due for a f/u with a lab appt prior to check lipid profile and a1c

## 2014-06-21 ENCOUNTER — Ambulatory Visit (INDEPENDENT_AMBULATORY_CARE_PROVIDER_SITE_OTHER): Payer: Medicare Other

## 2014-06-21 DIAGNOSIS — I4891 Unspecified atrial fibrillation: Secondary | ICD-10-CM

## 2014-06-21 LAB — POCT INR: INR: 3.1

## 2014-06-23 ENCOUNTER — Encounter: Payer: Self-pay | Admitting: Cardiovascular Disease

## 2014-06-23 ENCOUNTER — Ambulatory Visit (INDEPENDENT_AMBULATORY_CARE_PROVIDER_SITE_OTHER): Payer: Medicare Other | Admitting: Cardiovascular Disease

## 2014-06-23 VITALS — BP 120/80 | HR 46 | Ht 73.0 in | Wt 258.5 lb

## 2014-06-23 DIAGNOSIS — R001 Bradycardia, unspecified: Secondary | ICD-10-CM | POA: Insufficient documentation

## 2014-06-23 DIAGNOSIS — E785 Hyperlipidemia, unspecified: Secondary | ICD-10-CM | POA: Diagnosis not present

## 2014-06-23 DIAGNOSIS — I498 Other specified cardiac arrhythmias: Secondary | ICD-10-CM

## 2014-06-23 DIAGNOSIS — Z720 Tobacco use: Secondary | ICD-10-CM

## 2014-06-23 DIAGNOSIS — E1149 Type 2 diabetes mellitus with other diabetic neurological complication: Secondary | ICD-10-CM

## 2014-06-23 DIAGNOSIS — J449 Chronic obstructive pulmonary disease, unspecified: Secondary | ICD-10-CM

## 2014-06-23 DIAGNOSIS — F17200 Nicotine dependence, unspecified, uncomplicated: Secondary | ICD-10-CM

## 2014-06-23 DIAGNOSIS — R2689 Other abnormalities of gait and mobility: Secondary | ICD-10-CM

## 2014-06-23 DIAGNOSIS — I4891 Unspecified atrial fibrillation: Secondary | ICD-10-CM

## 2014-06-23 DIAGNOSIS — E119 Type 2 diabetes mellitus without complications: Secondary | ICD-10-CM | POA: Diagnosis not present

## 2014-06-23 DIAGNOSIS — I480 Paroxysmal atrial fibrillation: Secondary | ICD-10-CM

## 2014-06-23 DIAGNOSIS — R29818 Other symptoms and signs involving the nervous system: Secondary | ICD-10-CM

## 2014-06-23 MED ORDER — AMIODARONE HCL 200 MG PO TABS: 200.0000 mg | ORAL_TABLET | Freq: Every day | ORAL | Status: DC

## 2014-06-23 NOTE — Assessment & Plan Note (Signed)
Stable moderate chronic shortness of breath.

## 2014-06-23 NOTE — Assessment & Plan Note (Signed)
Heart rates in the 40s today. We have recommended that he decrease the amiodarone down to one a day continue to monitor his heart rate

## 2014-06-23 NOTE — Assessment & Plan Note (Signed)
We have encouraged continued exercise, careful diet management in an effort to lose weight. 

## 2014-06-23 NOTE — Patient Instructions (Addendum)
You are doing well. Please decrease the amiodarone down to one a day Monitor your heart rate  Try to quit smoking  Labs today  Please call us if you have new issues that need to be addressed before your next appt.  Your physician wants you to follow-up in: 6 months.  You will receive a reminder letter in the mail two months in advance. If you don't receive a letter, please call our office to schedule the follow-up appointment.
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
## 2014-06-23 NOTE — Assessment & Plan Note (Signed)
We have encouraged him to continue to work on weaning his cigarettes and smoking cessation. He will continue to work on this and does not want any assistance with chantix.  

## 2014-06-23 NOTE — Assessment & Plan Note (Signed)
Maintaining normal sinus rhythm. We have recommended that he decrease the amiodarone back to 200 mg daily. His wife increase this to twice a day for unclear reasons. If he continues to have bradycardia with symptoms, recommended he call the office. We would decrease the dose of the metoprolol

## 2014-06-23 NOTE — Assessment & Plan Note (Signed)
We'll check his lab work today. Goal LDL less than 70

## 2014-06-24 LAB — BASIC METABOLIC PANEL
BUN/Creatinine Ratio: 13 (ref 10–22)
BUN: 12 mg/dL (ref 8–27)
CO2: 26 mmol/L (ref 18–29)
Calcium: 8.6 mg/dL (ref 8.6–10.2)
Chloride: 100 mmol/L (ref 97–108)
Creatinine, Ser: 0.95 mg/dL (ref 0.76–1.27)
GFR, EST AFRICAN AMERICAN: 93 mL/min/{1.73_m2} (ref >59)
GFR, EST NON AFRICAN AMERICAN: 81 mL/min/{1.73_m2} (ref >59)
Glucose: 232 mg/dL — ABNORMAL HIGH (ref 65–99)
Potassium: 4.9 mmol/L (ref 3.5–5.2)
Sodium: 139 mmol/L (ref 134–144)

## 2014-06-24 LAB — LIPID PANEL
Chol/HDL Ratio: 5.5 ratio units — ABNORMAL HIGH (ref 0.0–5.0)
Cholesterol, Total: 198 mg/dL (ref 100–199)
HDL: 36 mg/dL — ABNORMAL LOW (ref >39)
LDL Calculated: 114 mg/dL — ABNORMAL HIGH (ref 0–99)
Triglycerides: 242 mg/dL — ABNORMAL HIGH (ref 0–149)
VLDL Cholesterol Cal: 48 mg/dL — ABNORMAL HIGH (ref 5–40)

## 2014-06-24 LAB — HEMOGLOBIN A1C
ESTIMATED AVERAGE GLUCOSE: 229 mg/dL
Hgb A1c MFr Bld: 9.6 % — ABNORMAL HIGH (ref 4.8–5.6)

## 2014-07-06 ENCOUNTER — Other Ambulatory Visit: Payer: Self-pay | Admitting: Family Medicine

## 2014-07-13 ENCOUNTER — Other Ambulatory Visit: Payer: Self-pay | Admitting: Family Medicine

## 2014-07-20 ENCOUNTER — Other Ambulatory Visit: Payer: Self-pay

## 2014-07-20 MED ORDER — HYDROCODONE-ACETAMINOPHEN 5-325 MG PO TABS: ORAL_TABLET | ORAL | Status: DC

## 2014-07-20 NOTE — Telephone Encounter (Signed)
Printed and placed in Kim's box. 

## 2014-07-20 NOTE — Telephone Encounter (Signed)
Pt left v/m requesting rx hydrocodone apap. Call when ready for pick up.  

## 2014-07-21 NOTE — Telephone Encounter (Signed)
Message left notifying patient's wife and Rx placed up front for pick up. 

## 2014-08-01 ENCOUNTER — Other Ambulatory Visit: Payer: Self-pay | Admitting: Family Medicine

## 2014-08-01 NOTE — Telephone Encounter (Signed)
Ok to refill 

## 2014-08-09 ENCOUNTER — Other Ambulatory Visit: Payer: Self-pay | Admitting: Cardiovascular Disease

## 2014-08-22 ENCOUNTER — Other Ambulatory Visit: Payer: Self-pay

## 2014-08-22 MED ORDER — HYDROCODONE-ACETAMINOPHEN 5-325 MG PO TABS: ORAL_TABLET | ORAL | Status: DC

## 2014-08-22 NOTE — Telephone Encounter (Signed)
Printed and placed in Kim's box. 

## 2014-08-22 NOTE — Telephone Encounter (Signed)
[name] left v/m requesting rx hydrocodone apap. Call when ready for pick up.

## 2014-08-23 NOTE — Telephone Encounter (Signed)
Left detailed msg on VM per HIPAA letting pt know Rx is ready for pick up

## 2014-09-01 ENCOUNTER — Encounter: Payer: Self-pay | Admitting: Family Medicine

## 2014-09-01 ENCOUNTER — Other Ambulatory Visit: Payer: Self-pay | Admitting: Family Medicine

## 2014-09-01 ENCOUNTER — Ambulatory Visit (INDEPENDENT_AMBULATORY_CARE_PROVIDER_SITE_OTHER): Payer: Medicare Other | Admitting: Family Medicine

## 2014-09-01 VITALS — BP 120/70 | HR 56 | Temp 98.0°F | Wt 253.8 lb

## 2014-09-01 DIAGNOSIS — M7020 Olecranon bursitis, unspecified elbow: Secondary | ICD-10-CM

## 2014-09-01 DIAGNOSIS — M7022 Olecranon bursitis, left elbow: Secondary | ICD-10-CM | POA: Insufficient documentation

## 2014-09-01 HISTORY — DX: Olecranon bursitis, left elbow: M70.22

## 2014-09-01 NOTE — Progress Notes (Signed)
BP 120/70  Pulse 56  Temp(Src) 98 F (36.7 C) (Oral)  Wt 253 lb 12.8 oz (115.123 kg)   CC: L arm swelling  Subjective:    Patient ID: [name], male    DOB: Apr 02, 1946, 68 y.o.   MRN: [id number]  HPI: [name] is a 68 y.o. male presenting on 09/01/2014 for Edema   Swelling of L olecranon bursa for last 2 weeks. Very tender. Entire arm hurts. Feels hot as well.   No h/o gout. 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  No fevers/chills Has tried muscle relaxant and brace.  Past Medical History  Diagnosis Date  . COPD (chronic obstructive pulmonary disease)     mod-severe COPD/emphysema.  PFTs 12/2010.  He still smokes 1 ppd.  . Obesity   . GERD (gastroesophageal reflux disease)   . Muscle spasm     chronic  . Neuralgia     pain in hands. L>R from accident  . Osteoarthritis   . Paroxysmal atrial fibrillation     on coumadin only.  . Hyperlipidemia     myalgias with simvastatin and atorvastatin  . T2DM (type 2 diabetes mellitus)   . Peripheral autonomic neuropathy due to diabetes mellitus   . Leg cramps     idiopathic severe  . Smoker     1ppd  . AAA (abdominal aortic aneurysm) 2013    s/p stent graft repair now with supra/pararenal aneurysm 3.5cm, referred to Dr. Sammuel Hines at [facility] for endovascular repair (12/2013)  . Fatty liver   . Cervical neck pain with evidence of disc disease 07/2011    MRI - disk bulging and foraminal stenosis, advanced at C4/5, 5/6; rec pain management for ESI by Dr. Mack Guise   . CHF (congestive heart failure)   . Depression   . ED (erectile dysfunction) 02/2012    penile injections - failed viagra, poor arterial flow (Tannenbaum)  . OSA (obstructive sleep apnea) 03/2012    AHI 18.6, desat to 74%, severe snoring, consider ENT eval  . Right shoulder injury 05/2012    after fall out of chair, s/p injection, rec conservative management with  PT Noemi Chapel)     Relevant past medical, surgical, family and social history reviewed and updated as indicated.  Allergies and medications reviewed and updated. Current Outpatient Prescriptions on File Prior to Visit  Medication Sig  . ADVAIR DISKUS 500-50 MCG/DOSE AEPB INHALE 1 PUFF BY MOUTH TWICE DAILY  . amiodarone (PACERONE) 200 MG tablet Take 1 tablet (200 mg total) by mouth daily.  . Coenzyme Q10 100 MG capsule Take 1 capsule (100 mg total) by mouth daily.  . cyclobenzaprine (FLEXERIL) 10 MG tablet TAKE 1 TABLET BY MOUTH TWICE DAILY AS NEEDED FOR MUSCLE SPASMS  . diclofenac sodium (VOLTAREN) 1 % GEL Apply 1 application topically 4 (four) times daily as needed.  Marland Kitchen DIGOX 125 MCG tablet TAKE 1 TABLET BY MOUTH EVERY DAY  . doxycycline (VIBRAMYCIN) 100 MG capsule Take 100 mg by mouth 2 (two) times daily as needed.  . fish oil-omega-3 fatty acids 1000 MG capsule Take 2 g by mouth daily.   . fluticasone (FLONASE) 50 MCG/ACT nasal spray Place 2 sprays into both nostrils daily.  . furosemide (LASIX) 20 MG tablet Take 20 mg by mouth as needed. For fluid.   Marland Kitchen gabapentin (NEURONTIN) 600 MG tablet TAKE 1 TABLET BY MOUTH THREE TIMES DAILY  .  glucose blood (ONE TOUCH ULTRA TEST) test strip Check blood sugar once daily and as needed. 250.60  . ipratropium-albuterol (DUONEB) 0.5-2.5 (3) MG/3ML SOLN Take 3 mLs by nebulization 2 (two) times daily as needed. For shortness of breath.  . metFORMIN (GLUCOPHAGE) 500 MG tablet TAKE 1 TABLET BY MOUTH EVERY MORNING AND 2 TABLETS BY MOUTH EVERY EVENING  . mupirocin cream (BACTROBAN) 2 % Apply 1 application topically 3 (three) times daily. In nose for 5 days  . nitroGLYCERIN (NITROLINGUAL) 0.4 MG/SPRAY spray Place 1 spray under the tongue every 5 (five) minutes as needed.  Marland Kitchen omeprazole (PRILOSEC) 40 MG capsule TAKE ONE CAPSULE BY MOUTH DAILY  . varenicline (CHANTIX CONTINUING MONTH PAK) 1 MG tablet Take 1 tablet (1 mg total) by mouth 2 (two) times daily.  .  varenicline (CHANTIX STARTING MONTH PAK) 0.5 MG X 11 & 1 MG X 42 tablet Take one 0.5 mg tablet by mouth once daily for 3 days, then increase to one 0.5 mg tablet twice daily for 4 days, then increase to one 1 mg tablet twice daily.  . VENTOLIN HFA 108 (90 BASE) MCG/ACT inhaler INHALE 2 PUFFS BY MOUTH EVERY 6 HOURS AS NEEDED FOR COUGH, WHEEZING AND SHORTNESS OF BREATH  . warfarin (COUMADIN) 5 MG tablet TAKE AS DIRECTED BY ANTICOAGULATION CLINIC  . Glucose Blood (ONETOUCH ULTRA BLUE VI) by In Vitro route. Test blood glucose 1 time per day and as directed.   Marland Kitchen HYDROcodone-acetaminophen (NORCO/VICODIN) 5-325 MG per tablet TAKE 1 TABLET BY MOUTH TWICE DAILY AS NEEDED FOR PAIN  . potassium chloride (K-DUR,KLOR-CON) 10 MEQ tablet TAKE 2 TABLETS BY MOUTH TWICE DAILY   No current facility-administered medications on file prior to visit.    Review of Systems Per HPI unless specifically indicated above    Objective:    BP 120/70  Pulse 56  Temp(Src) 98 F (36.7 C) (Oral)  Wt 253 lb 12.8 oz (115.123 kg)  Physical Exam  Nursing note and vitals reviewed. Constitutional: He appears well-developed and well-nourished. No distress.  Musculoskeletal: He exhibits no edema.  R elbow WNL L elbow - FROM flexion/extension. Swelling of olecranon bursa and mild erythema, no significant warmth. Mildly tender to palpation at bursa.   Aspiration of L olecranon bursa: IC obtained and in chart. Area cleaned with alcohol then betadine. Anesthesia achieved with <1cc lidocaine then using 21g needle bursa aspirated. 5.5cc clear serous blood tinged fluid removed, not sent for analysis. Compression dressing applied. Aftercare discussed    Assessment & Plan:   Problem List Items Addressed This Visit   Olecranon bursitis of left elbow - Primary     Anticipate traumatic, prolonged duration points against septic bursitis. Aspiration performed - clear blood tinged fluid consistent with traumatic bursitis. Aftercare  instructions provided. Pt agrees with plan.        Follow up plan: Return if symptoms worsen or fail to improve.

## 2014-09-01 NOTE — Patient Instructions (Addendum)
We aspirated bursa today. Keep arm elevated, keep compression bandage on for 1-2 days in hopes of preventing reaccumulation. May use ice and pain meds as normally. Please seek care if streaking redness worsening pain or fever.  Bursitis Bursitis is a swelling and soreness (inflammation) of a fluid-filled sac (bursa) that overlies and protects a joint. It can be caused by injury, overuse of the joint, arthritis or infection. The joints most likely to be affected are the elbows, shoulders, hips and knees. HOME CARE INSTRUCTIONS   Apply ice to the affected area for 15-20 minutes each hour while awake for 2 days. Put the ice in a plastic bag and place a towel between the bag of ice and your skin.  Rest the injured joint as much as possible, but continue to put the joint through a full range of motion, 4 times per day. (The shoulder joint especially becomes rapidly "frozen" if not used.) When the pain lessens, begin normal slow movements and usual activities.  Only take over-the-counter or prescription medicines for pain, discomfort or fever as directed by your caregiver.  Your caregiver may recommend draining the bursa and injecting medicine into the bursa. This may help the healing process.  Follow all instructions for follow-up with your caregiver. This includes any orthopedic referrals, physical therapy and rehabilitation. Any delay in obtaining necessary care could result in a delay or failure of the bursitis to heal and chronic pain. SEEK IMMEDIATE MEDICAL CARE IF:   Your pain increases even during treatment.  You develop an oral temperature above 102 F (38.9 C) and have heat and inflammation over the involved bursa. MAKE SURE YOU:   Understand these instructions.  Will watch your condition.  Will get help right away if you are not doing well or get worse. Document Released: 11/21/2000 Document Revised: 02/16/2012 Document Reviewed: 02/13/2014 [facility] Patient Information 2015  [place], [place]. This information is not intended to replace advice given to you by your health care provider. Make sure you discuss any questions you have with your health care provider.

## 2014-09-01 NOTE — Progress Notes (Signed)
Pre-visit discussion using our clinic review tool. No additional management support is needed unless otherwise documented below in the visit note.  

## 2014-09-01 NOTE — Assessment & Plan Note (Addendum)
Anticipate traumatic, prolonged duration points against septic bursitis. Aspiration performed - clear blood tinged fluid consistent with traumatic bursitis. Aftercare instructions provided. Pt agrees with plan.

## 2014-09-04 ENCOUNTER — Ambulatory Visit (INDEPENDENT_AMBULATORY_CARE_PROVIDER_SITE_OTHER): Payer: Medicare Other | Admitting: Family Medicine

## 2014-09-04 ENCOUNTER — Telehealth: Payer: Self-pay | Admitting: Family Medicine

## 2014-09-04 ENCOUNTER — Ambulatory Visit: Payer: Self-pay | Admitting: Family Medicine

## 2014-09-04 ENCOUNTER — Encounter: Payer: Self-pay | Admitting: Family Medicine

## 2014-09-04 VITALS — BP 116/72 | HR 79 | Temp 97.6°F | Resp 14 | Wt 257.0 lb

## 2014-09-04 DIAGNOSIS — M7020 Olecranon bursitis, unspecified elbow: Secondary | ICD-10-CM

## 2014-09-04 DIAGNOSIS — M7022 Olecranon bursitis, left elbow: Secondary | ICD-10-CM

## 2014-09-04 MED ORDER — ALBUTEROL SULFATE HFA 108 (90 BASE) MCG/ACT IN AERS: 2.0000 | INHALATION_SPRAY | Freq: Four times a day (QID) | RESPIRATORY_TRACT | Status: DC | PRN

## 2014-09-04 MED ORDER — FLUTICASONE-SALMETEROL 500-50 MCG/DOSE IN AEPB: INHALATION_SPRAY | RESPIRATORY_TRACT | Status: DC

## 2014-09-04 MED ORDER — CEPHALEXIN 500 MG PO CAPS: 500.0000 mg | ORAL_CAPSULE | Freq: Three times a day (TID) | ORAL | Status: DC

## 2014-09-04 NOTE — Patient Instructions (Signed)
Re aspiration performed today. Keep bandage on for 72 hours. May start short antibiotic course to prevent infection (sent to pharmacy). If re-accumulates, call us for referral to orthopedist.

## 2014-09-04 NOTE — Progress Notes (Signed)
Pre visit review using our clinic review tool, if applicable. No additional management support is needed unless otherwise documented below in the visit note. 

## 2014-09-04 NOTE — Telephone Encounter (Signed)
emmi mailed  °
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
## 2014-09-05 NOTE — Assessment & Plan Note (Signed)
Rapid re-accumulation of L olecranon bursa.Marland Kitchen Re-drained today with clear serosanguinous fluid. 2nd aspiration in short period of time - will treat with short ppx keflex course. Dressed in compression bandage with orthotic foam. Discussed if reaccumulates - would recommend ortho referral rather than re-aspiration in office again. Pt agrees with plan.

## 2014-09-06 ENCOUNTER — Ambulatory Visit (INDEPENDENT_AMBULATORY_CARE_PROVIDER_SITE_OTHER): Payer: Medicare Other

## 2014-09-06 DIAGNOSIS — I4891 Unspecified atrial fibrillation: Secondary | ICD-10-CM

## 2014-09-06 LAB — POCT INR: INR: 2.6

## 2014-09-18 ENCOUNTER — Telehealth: Payer: Self-pay

## 2014-09-18 ENCOUNTER — Telehealth: Payer: Self-pay | Admitting: Family Medicine

## 2014-09-18 DIAGNOSIS — M7022 Olecranon bursitis, left elbow: Secondary | ICD-10-CM

## 2014-09-18 NOTE — Telephone Encounter (Signed)
Noted. Pt refused ortho eval today so we cancelled scheduled appointment. Pt to go to [facility] now.

## 2014-09-18 NOTE — Telephone Encounter (Signed)
Pt finished antibiotics on 09/17/14; but 2 -3 days ago lt elbow began to drain pus again, warm and tender to touch; no fever. 09/04/14 visit pt was advised if reaccumulates again would do ortho referral.Please advise.

## 2014-09-18 NOTE — Telephone Encounter (Signed)
Ortho referral placed. Will try and expedite.

## 2014-09-18 NOTE — Telephone Encounter (Signed)
Called pt to give appointment date and time [facility] Dr. Rhona Raider today 09/18/14 3:00 arrival time, pt and wife state they are not sure they want appointment now.  Concerned about insurance paying and would like to just go to urgent care. Call given to triage Nurse R.Lia Hopping / lt

## 2014-09-18 NOTE — Telephone Encounter (Signed)
[name] had appt for pt today to be seen at [name] but pts wife is concerned about ins not paying for ortho and request to be seen by Dr Darnell Level today before pt goes out of town on 09/19/14. Dr Darnell Level advised pt needs to see ortho; pt has appt today at [place] [facility] [place] with Dr Rhona Raider arrival time 3 PM and appt time 3:30 pm. [name] said pt said to cancel appt with ortho and pt will go to [facility]. I asked if I could speak with pt and try to explain the importance of pt being seen by ortho and [name] said pt did not want to speak with anyone at this time and to cancel the ortho appt. Call ended.

## 2014-09-18 NOTE — Telephone Encounter (Signed)
Camille with [facility] notified Dr Darnell Level would do ortho referral and pt care coordinator will contact pt; if pt condition changes or worsens prior to cb pt to call office; Rosaria Ferries is not in office to day and advised Marlynn Perking pt care coordinator. Rosendo Gros called back and pt is going out of town 09/19/14 and pts wife wants pt seen today. Rosendo Gros will advise pts wife that pt care coordinator will call her back ASAP about appt today at [facility]; pt contact # [phone]. Camille voiced understanding.

## 2014-09-18 NOTE — Telephone Encounter (Signed)
Patient Information:  Caller Name: Barnetta Chapel  Phone: [phone]  Patient: [name], [name]  Gender: Male  DOB: 07-03-1946  Age: 68 Years  PCP: Ria Bush [name])  Office Follow Up:  Does the office need to follow up with this patient?: Yes  Instructions For The Office: Pt is requesting appt for today at any time; pt is leaving to go out of town tomorrow. Pt is concerned due to the drainage from the site. Explained that MD was aware and referral to ortho is recommended and ortho will call her today. Pt and wife are still insisting on being seen today. Office called and instructed that they believe they can get him in today at [facility] today.  Family made aware and will wait for return call.  RN Note:  Instructed that he needs to be seen today and family will comply  Symptoms  Reason For Call & Symptoms: Wife is calling and states that left elbow has been drained x2 by Dr Darnell Level; last seen in the office on 09/04/14; started on Cephalexin and completed it on 09/17/2014;  sx are getting worse; feels that it may be infected; left elbow has a pus like pocket and warm to the touch;  Reviewed Health History In EMR: Yes  Reviewed Medications In EMR: Yes  Reviewed Allergies In EMR: Yes  Reviewed Surgeries / Procedures: Yes  Date of Onset of Symptoms: 09/16/2014  Guideline(s) Used:  No Protocol Available - Sick Adult  Disposition Per Guideline:   See Today in Office  Reason For Disposition Reached:   Nursing judgment  Advice Given:  Call Back If:  New symptoms develop  You become worse.  Patient Will Follow Care Advice:  YES

## 2014-09-28 ENCOUNTER — Other Ambulatory Visit: Payer: Self-pay | Admitting: Cardiovascular Disease

## 2014-09-28 NOTE — Telephone Encounter (Signed)
Please review warfarin for refill.  

## 2014-10-03 ENCOUNTER — Other Ambulatory Visit: Payer: Self-pay | Admitting: Family Medicine

## 2014-10-03 ENCOUNTER — Other Ambulatory Visit: Payer: Self-pay

## 2014-10-03 NOTE — Telephone Encounter (Signed)
[name] left v/m request rx hydrocodone apap. Call when ready for pick up. [name] request larger quantity if possible.

## 2014-10-04 DIAGNOSIS — M7022 Olecranon bursitis, left elbow: Secondary | ICD-10-CM | POA: Diagnosis not present

## 2014-10-04 MED ORDER — HYDROCODONE-ACETAMINOPHEN 5-325 MG PO TABS: ORAL_TABLET | ORAL | Status: DC

## 2014-10-04 NOTE — Telephone Encounter (Signed)
Message left notifying patient's wife. Rx placed up front for pick up.

## 2014-10-04 NOTE — Telephone Encounter (Signed)
Printed and placed in Kim's box. Did he ever see ortho for recurrent elbow bursitis?

## 2014-10-06 ENCOUNTER — Encounter: Payer: Self-pay | Admitting: Family Medicine

## 2014-10-07 DIAGNOSIS — S61412A Laceration without foreign body of left hand, initial encounter: Secondary | ICD-10-CM | POA: Diagnosis not present

## 2014-10-18 ENCOUNTER — Ambulatory Visit (INDEPENDENT_AMBULATORY_CARE_PROVIDER_SITE_OTHER): Payer: Medicare Other | Admitting: Pharmacist

## 2014-10-18 DIAGNOSIS — I4891 Unspecified atrial fibrillation: Secondary | ICD-10-CM | POA: Diagnosis not present

## 2014-10-18 LAB — POCT INR: INR: 3.5

## 2014-10-30 ENCOUNTER — Other Ambulatory Visit: Payer: Self-pay | Admitting: Family Medicine

## 2014-10-30 ENCOUNTER — Other Ambulatory Visit: Payer: Self-pay

## 2014-10-30 MED ORDER — HYDROCODONE-ACETAMINOPHEN 5-325 MG PO TABS: ORAL_TABLET | ORAL | Status: DC

## 2014-10-30 NOTE — Telephone Encounter (Signed)
Printed and placed in Kim's box. 

## 2014-10-30 NOTE — Telephone Encounter (Signed)
[name] Leath left v/m requesting rx hydrocodone apap. Call when ready for pick up.

## 2014-10-31 NOTE — Telephone Encounter (Signed)
Patient's wife notified and Rx placed up front for pick up. 

## 2014-11-21 ENCOUNTER — Other Ambulatory Visit: Payer: Self-pay | Admitting: Family Medicine

## 2014-11-28 ENCOUNTER — Other Ambulatory Visit: Payer: Self-pay

## 2014-11-28 NOTE — Telephone Encounter (Signed)
Pt left v/m requesting rx hydrocodone apap. Call when ready for pick up. Pt last seen sick visit 09/04/14.

## 2014-11-29 MED ORDER — HYDROCODONE-ACETAMINOPHEN 5-325 MG PO TABS: ORAL_TABLET | ORAL | Status: DC

## 2014-11-29 NOTE — Telephone Encounter (Signed)
Printed.  Thanks.  

## 2014-11-29 NOTE — Telephone Encounter (Signed)
Patient's wife notified and Rx placed up front for pick up. 

## 2014-12-13 ENCOUNTER — Other Ambulatory Visit: Payer: Self-pay | Admitting: Family Medicine

## 2014-12-20 ENCOUNTER — Telehealth: Payer: Self-pay

## 2014-12-20 NOTE — Telephone Encounter (Signed)
Attempted to call pt, overdue for Coumadin f/u.  Pt's last INR check was 10/18/14.  LMOM for pt TCB for appt on 12/13/14 and again today 12/20/14.  Mailing delinquent Coumadin letter to pt's home address.

## 2014-12-26 ENCOUNTER — Other Ambulatory Visit: Payer: Self-pay | Admitting: Family Medicine

## 2014-12-27 ENCOUNTER — Other Ambulatory Visit: Payer: Self-pay

## 2014-12-27 ENCOUNTER — Ambulatory Visit (INDEPENDENT_AMBULATORY_CARE_PROVIDER_SITE_OTHER): Payer: Medicare Other

## 2014-12-27 DIAGNOSIS — I4891 Unspecified atrial fibrillation: Secondary | ICD-10-CM | POA: Diagnosis not present

## 2014-12-27 LAB — POCT INR: INR: 3

## 2014-12-27 NOTE — Telephone Encounter (Signed)
[name] left v/m requesting rx hydrocodone apap. Call when ready for pick up. Last sick visit 09/04/14 and no future appt scheduled.

## 2014-12-28 MED ORDER — HYDROCODONE-ACETAMINOPHEN 5-325 MG PO TABS: ORAL_TABLET | ORAL | Status: DC

## 2014-12-28 NOTE — Telephone Encounter (Signed)
Patient's wife notified and Rx placed up front for pick up. 

## 2014-12-28 NOTE — Telephone Encounter (Signed)
printed and in [facility]' box.

## 2015-01-10 ENCOUNTER — Other Ambulatory Visit: Payer: Self-pay | Admitting: Family Medicine

## 2015-01-14 ENCOUNTER — Other Ambulatory Visit: Payer: Self-pay | Admitting: Cardiovascular Disease

## 2015-01-15 ENCOUNTER — Other Ambulatory Visit: Payer: Self-pay | Admitting: Family Medicine

## 2015-01-15 NOTE — Telephone Encounter (Signed)
Please review for refill. Thanks!  

## 2015-01-16 NOTE — Telephone Encounter (Signed)
Can patient have a refill on this medication ? 

## 2015-01-17 ENCOUNTER — Other Ambulatory Visit: Payer: Self-pay | Admitting: Cardiovascular Disease

## 2015-01-17 ENCOUNTER — Other Ambulatory Visit: Payer: Self-pay | Admitting: Family Medicine

## 2015-01-17 NOTE — Telephone Encounter (Signed)
Ok to refill 

## 2015-01-18 ENCOUNTER — Telehealth: Payer: Self-pay | Admitting: *Deleted

## 2015-01-18 NOTE — Telephone Encounter (Signed)
PA for flexeril in your IN box for completion.

## 2015-01-19 NOTE — Telephone Encounter (Signed)
filled and in Kim's box. 

## 2015-01-19 NOTE — Telephone Encounter (Signed)
Flexeril denied. In your IN box for review.

## 2015-01-19 NOTE — Telephone Encounter (Signed)
Form faxed. Will await determination. Wife notified.

## 2015-01-19 NOTE — Telephone Encounter (Signed)
Ms [name] left v/m requesting status of cyclobenzaprine PA.

## 2015-01-20 MED ORDER — TIZANIDINE HCL 2 MG PO TABS: 2.0000 mg | ORAL_TABLET | Freq: Two times a day (BID) | ORAL | Status: DC | PRN

## 2015-01-20 NOTE — Telephone Encounter (Signed)
Noted. plz scan and notify patient may pay out of pocket or could try change to zanaflex -- and I've sent to pharmacy.

## 2015-01-20 NOTE — Addendum Note (Signed)
Addended by: Ria Bush on: 01/20/2015 09:08 AM   Modules accepted: Orders

## 2015-01-22 NOTE — Telephone Encounter (Signed)
Patient's wife notified and he will try the zanaflex.

## 2015-01-24 ENCOUNTER — Ambulatory Visit (INDEPENDENT_AMBULATORY_CARE_PROVIDER_SITE_OTHER): Payer: Medicare Other

## 2015-01-24 DIAGNOSIS — I4891 Unspecified atrial fibrillation: Secondary | ICD-10-CM

## 2015-01-24 LAB — POCT INR: INR: 2.5

## 2015-01-26 ENCOUNTER — Other Ambulatory Visit: Payer: Self-pay

## 2015-01-26 MED ORDER — HYDROCODONE-ACETAMINOPHEN 5-325 MG PO TABS: ORAL_TABLET | ORAL | Status: DC

## 2015-01-26 NOTE — Telephone Encounter (Signed)
Message left advising patient's wife and Rx placed up front for pick up. 

## 2015-01-26 NOTE — Telephone Encounter (Signed)
Printed and in Kim's box 

## 2015-01-26 NOTE — Telephone Encounter (Signed)
[name] Leath left v/m requesting rx hydrocodone apap. Call when ready for pick up. Pt last seen 09/04/14 and rx last printed 12/28/14.

## 2015-01-29 ENCOUNTER — Ambulatory Visit: Payer: Medicare Other | Admitting: Family Medicine

## 2015-01-31 ENCOUNTER — Ambulatory Visit: Payer: Medicare Other | Admitting: Cardiovascular Disease

## 2015-02-01 ENCOUNTER — Ambulatory Visit: Payer: Medicare Other | Admitting: Cardiovascular Disease

## 2015-02-02 ENCOUNTER — Ambulatory Visit (INDEPENDENT_AMBULATORY_CARE_PROVIDER_SITE_OTHER): Payer: Medicare Other | Admitting: Family Medicine

## 2015-02-02 ENCOUNTER — Encounter: Payer: Self-pay | Admitting: Family Medicine

## 2015-02-02 VITALS — BP 138/72 | HR 77 | Temp 97.5°F | Wt 242.0 lb

## 2015-02-02 DIAGNOSIS — M7022 Olecranon bursitis, left elbow: Secondary | ICD-10-CM | POA: Diagnosis not present

## 2015-02-02 MED ORDER — FUROSEMIDE 20 MG PO TABS: 20.0000 mg | ORAL_TABLET | ORAL | Status: DC | PRN

## 2015-02-02 NOTE — Patient Instructions (Signed)
[place] re-aspirated today. If re-accumulates, return to see Dr Rhona Raider to discuss other options. Compression dressing today.

## 2015-02-02 NOTE — Assessment & Plan Note (Signed)
Aspiration performed today. Advised if reaccumulates rec return with ortho. Pt agrees with plan. Red flags to return discussed including worsening pain, fever or spreading erythema.

## 2015-02-02 NOTE — Progress Notes (Signed)
Pre visit review using our clinic review tool, if applicable. No additional management support is needed unless otherwise documented below in the visit note. 

## 2015-02-02 NOTE — Progress Notes (Signed)
BP 138/72 mmHg  Pulse 77  Temp(Src) 97.5 F (36.4 C) (Oral)  Wt 242 lb (109.77 kg)  SpO2 91%   CC: check L arm  Subjective:    Patient ID: [name], male    DOB: January 12, 1946, 69 y.o.   MRN: [id number]  HPI: [name] is a 69 y.o. 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 Last aspiration we prescribed keflex course as well. We scheduled ortho referral after initial refusal, then pt called and cancelled appointment. Finally pt agreed to ortho appt - saw Dr Rhona Raider and had several aspirations, last with cortisone shot which provided best relief. Has chronically reaccummulated. Interested in removal.  Uses elastic sleeve for elbow compression regularly.   Lab Results  Component Value Date   CREATININE 0.95 06/23/2014   No redness, no fevers.  Relevant past medical, surgical, family and social history reviewed and updated as indicated. Interim medical history since our last visit reviewed. Allergies and medications reviewed and updated. Current Outpatient Prescriptions on File Prior to Visit  Medication Sig  . albuterol (PROVENTIL HFA;VENTOLIN HFA) 108 (90 BASE) MCG/ACT inhaler Inhale 2 puffs into the lungs every 6 (six) hours as needed for wheezing or shortness of breath.  Marland Kitchen amiodarone (PACERONE) 200 MG tablet Take 1 tablet (200 mg total) by mouth daily.  . Coenzyme Q10 100 MG capsule Take 1 capsule (100 mg total) by mouth daily.  . diclofenac sodium (VOLTAREN) 1 % GEL Apply 1 application topically 4 (four) times daily as needed.  Marland Kitchen DIGOX 125 MCG tablet TAKE 1 TABLET BY MOUTH EVERY DAY  . fish oil-omega-3 fatty acids 1000 MG capsule Take 2 g by mouth daily.   . fluticasone (FLONASE) 50 MCG/ACT nasal spray Place 2 sprays into both nostrils daily.  . Fluticasone-Salmeterol (ADVAIR DISKUS) 500-50 MCG/DOSE AEPB INHALE 1 PUFF BY MOUTH TWICE DAILY  . gabapentin (NEURONTIN) 600 MG tablet TAKE 1 TABLET BY MOUTH THREE  TIMES DAILY  . glucose blood (ONE TOUCH ULTRA TEST) test strip Check blood sugar once daily and as needed. 250.60  . Glucose Blood (ONETOUCH ULTRA BLUE VI) by In Vitro route. Test blood glucose 1 time per day and as directed.   Marland Kitchen HYDROcodone-acetaminophen (NORCO/VICODIN) 5-325 MG per tablet TAKE 1 TABLET BY MOUTH TWICE DAILY AS NEEDED FOR PAIN  . ipratropium-albuterol (DUONEB) 0.5-2.5 (3) MG/3ML SOLN Take 3 mLs by nebulization 2 (two) times daily as needed. For shortness of breath.  . metFORMIN (GLUCOPHAGE) 500 MG tablet TAKE 1 TABLET BY MOUTH EVERY MORNING AND TAKE 2 TABLETS EVERY EVENING  . mupirocin cream (BACTROBAN) 2 % Apply 1 application topically 3 (three) times daily. In nose for 5 days  . nitroGLYCERIN (NITROLINGUAL) 0.4 MG/SPRAY spray Place 1 spray under the tongue every 5 (five) minutes as needed.  Marland Kitchen omeprazole (PRILOSEC) 40 MG capsule TAKE 1 CAPSULE BY MOUTH EVERY DAY  . potassium chloride (K-DUR,KLOR-CON) 10 MEQ tablet TAKE 2 TABLETS BY MOUTH TWICE DAILY  . tiZANidine (ZANAFLEX) 2 MG tablet Take 1 tablet (2 mg total) by mouth 2 (two) times daily as needed for muscle spasms.  Marland Kitchen warfarin (COUMADIN) 5 MG tablet TAKE AS DIRECTED BY ANTICOAGULATION CLINIC   No current facility-administered medications on file prior to visit.    Review of Systems Per HPI unless specifically indicated above     Objective:    BP 138/72 mmHg  Pulse 77  Temp(Src) 97.5 F (36.4 C) (Oral)  Wt 242 lb (109.77 kg)  SpO2 91%  Wt Readings from Last 3 Encounters:  02/02/15 242 lb (109.77 kg)  09/04/14 257 lb (116.574 kg)  09/01/14 253 lb 12.8 oz (115.123 kg)    Physical Exam  Constitutional: He appears well-developed and well-nourished. No distress.  Musculoskeletal: He exhibits edema (1+ pedal edema).  reaccumulation of L olecranon bursa without surrounding erythema, warmth or tenderness  Nursing note and vitals reviewed.  Results for orders placed or performed in visit on 01/24/15  POCT INR    Result Value Ref Range   INR 2.5    Olecranon bursitis aspiration: IC obtained and in chart. Area cleaned with betadine and numbing achieved with ethyl chloride spray. Using 21g needle bursa aspirated with <1cc serosanguinous fluid but ~3ccc fluid drained into gauze with pressure. Compression dressing placed. After care instructions provided.    Assessment & Plan:   Problem List Items Addressed This Visit    Olecranon bursitis of left elbow - Primary    Aspiration performed today. Advised if reaccumulates rec return with ortho. Pt agrees with plan. Red flags to return discussed including worsening pain, fever or spreading erythema.           Follow up plan: No Follow-up on file.

## 2015-02-13 ENCOUNTER — Other Ambulatory Visit: Payer: Self-pay | Admitting: Family Medicine

## 2015-02-14 NOTE — Telephone Encounter (Signed)
Ok to refill 

## 2015-02-28 ENCOUNTER — Ambulatory Visit (INDEPENDENT_AMBULATORY_CARE_PROVIDER_SITE_OTHER): Payer: Medicare Other

## 2015-02-28 ENCOUNTER — Telehealth: Payer: Self-pay | Admitting: Cardiovascular Disease

## 2015-02-28 DIAGNOSIS — I4891 Unspecified atrial fibrillation: Secondary | ICD-10-CM

## 2015-02-28 LAB — POCT INR: INR: 2.8

## 2015-02-28 NOTE — Telephone Encounter (Signed)
Spoke w/ Doroteo Bradford, RN, as she saw pt in coumadin clinic. She states that pt had gotten in late from being out of town last night and has not had much sleep. Will not call pt, as he was going home to catch up on his sleep.

## 2015-02-28 NOTE — Telephone Encounter (Signed)
Patient walked into clinic with wife to have coum. Check.  Patient complains that he is weak.  Did work in the yard yesterday.  Patient Overdue for 6 month follow up scheduled next available with [place] 03-19-15 at 1:45 pm.  Please call patient to see if there is a need to come in sooner.

## 2015-03-01 ENCOUNTER — Telehealth: Payer: Self-pay

## 2015-03-01 NOTE — Telephone Encounter (Signed)
No answer, no way to leave message about flu vaccine, mail box not set up

## 2015-03-07 ENCOUNTER — Other Ambulatory Visit: Payer: Self-pay

## 2015-03-07 NOTE — Telephone Encounter (Signed)
Pt left v/m requesting rx hydrocodone apap. Call when ready for pick up.  

## 2015-03-08 MED ORDER — HYDROCODONE-ACETAMINOPHEN 5-325 MG PO TABS: ORAL_TABLET | ORAL | Status: DC

## 2015-03-08 NOTE — Telephone Encounter (Signed)
Printed and in Kim's box 

## 2015-03-08 NOTE — Telephone Encounter (Signed)
Message left advising patient's wife and Rx placed up front for pick up. 

## 2015-03-09 ENCOUNTER — Other Ambulatory Visit: Payer: Self-pay | Admitting: Family Medicine

## 2015-03-09 ENCOUNTER — Other Ambulatory Visit: Payer: Self-pay | Admitting: Cardiovascular Disease

## 2015-03-09 NOTE — Telephone Encounter (Signed)
Ok to refill 

## 2015-03-19 ENCOUNTER — Ambulatory Visit: Payer: Medicare Other | Admitting: Cardiovascular Disease

## 2015-03-20 ENCOUNTER — Other Ambulatory Visit: Payer: Self-pay | Admitting: Cardiovascular Disease

## 2015-03-21 ENCOUNTER — Ambulatory Visit: Payer: Medicare Other | Admitting: Cardiovascular Disease

## 2015-03-21 ENCOUNTER — Telehealth: Payer: Self-pay | Admitting: Cardiovascular Disease

## 2015-03-21 NOTE — Telephone Encounter (Signed)
°  Pt c/o of Chest Pain: STAT if CP now or developed within 24 hours  1. Are you having CP right now?  Yes   2. Are you experiencing any other symptoms (ex. SOB, nausea, vomiting, sweating)? Left sided arm pain   3. How long have you been experiencing CP? Not sure   4. Is your CP continuous or coming and going? Not sure   5. Have you taken Nitroglycerin? No  ?

## 2015-03-21 NOTE — Telephone Encounter (Signed)
Unfortunately, I cannot determine over the phone if the patient is having an acute coronary event or not. My advice given the documentation available (currently having chest pain that has been worsening) is for him to go to the ER. Should he decide not to do this that is his choice.

## 2015-03-21 NOTE — Telephone Encounter (Signed)
Spoke w/ pt and his wife on speaker phone. He reports left sided pain that seems to start in his stomach and radiate to his chest, arms, back and over to his rt side.  Reports his fingers are tingling. Describes pain as achiness that comes and goes. Reports sx have been worsening over the past 3 weeks.  Pt denies SOB, n/v or sweating.  Reports that nothing relieves the pain, he will apply "some muscle cream all over" until he falls asleep, but wakes to more pain.  Pt has not been seen since 06/23/14 and missed his last appt.  Pt and wife are requesting an appt as soon as possible, "with whatever doctor will see me". Pt refuses to go to ED, states he wants to be seen in our office.  Advised them that I will make Christell Faith, PA aware of his situation and call him back w/ any suggestions he may have.  Advised him to call 911 or proceed to ED if sx become emergent, but he and wife state that they will not do this. Please advise.  Thank you.

## 2015-03-21 NOTE — Telephone Encounter (Signed)
Spoke w/ pt's wife.  Advised her of Ryan's recommendation.  She is hesitant and again asks if pt can be worked in to be seen "sometime this month". After some discussion, she is agreeable.  Asked her to call back if we can be of further assistance.

## 2015-04-03 ENCOUNTER — Other Ambulatory Visit: Payer: Self-pay | Admitting: *Deleted

## 2015-04-03 NOTE — Telephone Encounter (Signed)
Last acute OV 02/02/15.  Rx was last filled 03/08/15 #[id number].  Patient states he is in a lot a pain lately and is needing 1 tablet twice a day.  He is running out after 3 weeks.  He is requesting enough for 30 days.  Okay to refill?

## 2015-04-04 MED ORDER — HYDROCODONE-ACETAMINOPHEN 5-325 MG PO TABS
ORAL_TABLET | ORAL | Status: DC
Start: 2015-04-04 — End: 2015-05-03

## 2015-04-04 NOTE — Telephone Encounter (Signed)
Printed and in Kim's box 

## 2015-04-04 NOTE — Telephone Encounter (Signed)
Patient's wife notified and Rx placed up front for pick up. 

## 2015-04-11 ENCOUNTER — Ambulatory Visit (INDEPENDENT_AMBULATORY_CARE_PROVIDER_SITE_OTHER): Payer: Medicare Other

## 2015-04-11 ENCOUNTER — Telehealth: Payer: Self-pay

## 2015-04-11 DIAGNOSIS — I4891 Unspecified atrial fibrillation: Secondary | ICD-10-CM

## 2015-04-11 LAB — POCT INR: INR: 3.3

## 2015-04-11 NOTE — Telephone Encounter (Signed)
In your IN box for completion.  

## 2015-04-11 NOTE — Telephone Encounter (Signed)
Deakon Frix Spouse [phone] call when ready [phone]  [name] drop by to leave Renewal application to be filled out for [place]. Please call [name] when ready for pick up, plus [name] has an appointment on Monday she can pick up then if possible. I have placed on your desk.

## 2015-04-11 NOTE — Telephone Encounter (Signed)
Filled and in Kim's box. 

## 2015-04-12 NOTE — Telephone Encounter (Signed)
Will give to [facility] at her appt.

## 2015-04-13 ENCOUNTER — Other Ambulatory Visit: Payer: Self-pay | Admitting: Cardiovascular Disease

## 2015-04-13 ENCOUNTER — Ambulatory Visit: Payer: Medicare Other | Admitting: Cardiovascular Disease

## 2015-04-13 ENCOUNTER — Other Ambulatory Visit: Payer: Self-pay | Admitting: Family Medicine

## 2015-04-13 NOTE — Telephone Encounter (Signed)
Ok to refill 

## 2015-05-03 ENCOUNTER — Other Ambulatory Visit: Payer: Self-pay | Admitting: Family Medicine

## 2015-05-03 NOTE — Telephone Encounter (Signed)
[name] left voicemail requesting Rx refilled. Please call [name] when Rx ready for pick-up

## 2015-05-04 MED ORDER — HYDROCODONE-ACETAMINOPHEN 5-325 MG PO TABS: ORAL_TABLET | ORAL | Status: DC

## 2015-05-04 NOTE — Telephone Encounter (Signed)
Printed and in Kim's box 

## 2015-05-04 NOTE — Telephone Encounter (Signed)
Lm on pts vm and informed him Rx is available for pickup from the front desk. Pt advised third party unable to pickup  

## 2015-05-08 ENCOUNTER — Other Ambulatory Visit: Payer: Self-pay | Admitting: Family Medicine

## 2015-05-09 ENCOUNTER — Other Ambulatory Visit: Payer: Self-pay | Admitting: Family Medicine

## 2015-05-09 NOTE — Telephone Encounter (Signed)
Ok to refill 

## 2015-05-22 ENCOUNTER — Ambulatory Visit: Payer: Medicare Other | Admitting: Cardiovascular Disease

## 2015-05-31 ENCOUNTER — Telehealth: Payer: Self-pay

## 2015-05-31 NOTE — Telephone Encounter (Signed)
Diabetic Bundle. Left voicemail advising pt her A1C blood test is due. Pt advised to contact PCP's office to schedule.  

## 2015-06-08 ENCOUNTER — Ambulatory Visit (INDEPENDENT_AMBULATORY_CARE_PROVIDER_SITE_OTHER)
Admission: RE | Admit: 2015-06-08 | Discharge: 2015-06-08 | Disposition: A | Discharge status: Home / Self Care | Payer: Medicare Other | Source: Ambulatory Visit | Attending: Family Medicine | Admitting: Family Medicine

## 2015-06-08 ENCOUNTER — Encounter: Payer: Self-pay | Admitting: *Deleted

## 2015-06-08 ENCOUNTER — Other Ambulatory Visit (INDEPENDENT_AMBULATORY_CARE_PROVIDER_SITE_OTHER): Payer: Medicare Other

## 2015-06-08 ENCOUNTER — Other Ambulatory Visit: Payer: Self-pay | Admitting: Cardiovascular Disease

## 2015-06-08 ENCOUNTER — Encounter: Payer: Self-pay | Admitting: Radiology

## 2015-06-08 ENCOUNTER — Ambulatory Visit (INDEPENDENT_AMBULATORY_CARE_PROVIDER_SITE_OTHER): Payer: Medicare Other | Admitting: Family Medicine

## 2015-06-08 VITALS — BP 134/62 | HR 49 | Temp 97.8°F | Wt 238.4 lb

## 2015-06-08 DIAGNOSIS — J439 Emphysema, unspecified: Secondary | ICD-10-CM | POA: Diagnosis not present

## 2015-06-08 DIAGNOSIS — K760 Fatty (change of) liver, not elsewhere classified: Secondary | ICD-10-CM

## 2015-06-08 DIAGNOSIS — E1140 Type 2 diabetes mellitus with diabetic neuropathy, unspecified: Secondary | ICD-10-CM

## 2015-06-08 DIAGNOSIS — R634 Abnormal weight loss: Secondary | ICD-10-CM

## 2015-06-08 DIAGNOSIS — I480 Paroxysmal atrial fibrillation: Secondary | ICD-10-CM

## 2015-06-08 DIAGNOSIS — I4891 Unspecified atrial fibrillation: Secondary | ICD-10-CM | POA: Diagnosis not present

## 2015-06-08 DIAGNOSIS — R892 Abnormal level of other drugs, medicaments and biological substances in specimens from other organs, systems and tissues: Secondary | ICD-10-CM | POA: Insufficient documentation

## 2015-06-08 DIAGNOSIS — E1165 Type 2 diabetes mellitus with hyperglycemia: Secondary | ICD-10-CM | POA: Diagnosis not present

## 2015-06-08 DIAGNOSIS — R001 Bradycardia, unspecified: Secondary | ICD-10-CM

## 2015-06-08 DIAGNOSIS — I10 Essential (primary) hypertension: Secondary | ICD-10-CM

## 2015-06-08 DIAGNOSIS — Z79891 Long term (current) use of opiate analgesic: Secondary | ICD-10-CM | POA: Diagnosis not present

## 2015-06-08 DIAGNOSIS — M542 Cervicalgia: Secondary | ICD-10-CM

## 2015-06-08 DIAGNOSIS — J449 Chronic obstructive pulmonary disease, unspecified: Secondary | ICD-10-CM

## 2015-06-08 DIAGNOSIS — Z1211 Encounter for screening for malignant neoplasm of colon: Secondary | ICD-10-CM | POA: Diagnosis not present

## 2015-06-08 DIAGNOSIS — E782 Mixed hyperlipidemia: Secondary | ICD-10-CM

## 2015-06-08 DIAGNOSIS — Z720 Tobacco use: Secondary | ICD-10-CM

## 2015-06-08 DIAGNOSIS — G8929 Other chronic pain: Secondary | ICD-10-CM

## 2015-06-08 DIAGNOSIS — IMO0002 Reserved for concepts with insufficient information to code with codable children: Secondary | ICD-10-CM

## 2015-06-08 DIAGNOSIS — G4733 Obstructive sleep apnea (adult) (pediatric): Secondary | ICD-10-CM

## 2015-06-08 HISTORY — DX: Abnormal level of other drugs, medicaments and biological substances in specimens from other organs, systems and tissues: R89.2

## 2015-06-08 LAB — CBC WITH DIFFERENTIAL/PLATELET
BASOS PCT: 0.5 % (ref 0.0–3.0)
Basophils Absolute: 0 10*3/uL (ref 0.0–0.1)
EOS ABS: 0.2 10*3/uL (ref 0.0–0.7)
Eosinophils Relative: 2.5 % (ref 0.0–5.0)
HEMATOCRIT: 46.7 % (ref 39.0–52.0)
HEMOGLOBIN: 15 g/dL (ref 13.0–17.0)
LYMPHS PCT: 26.5 % (ref 12.0–46.0)
Lymphs Abs: 2.6 10*3/uL (ref 0.7–4.0)
MCHC: 32.2 g/dL (ref 30.0–36.0)
MCV: 85.7 fl (ref 78.0–100.0)
Monocytes Absolute: 0.5 10*3/uL (ref 0.1–1.0)
Monocytes Relative: 5.2 % (ref 3.0–12.0)
NEUTROS PCT: 65.3 % (ref 43.0–77.0)
Neutro Abs: 6.5 10*3/uL (ref 1.4–7.7)
Platelets: 267 10*3/uL (ref 150.0–400.0)
RBC: 5.44 Mil/uL (ref 4.22–5.81)
RDW: 15.2 % (ref 11.5–15.5)
WBC: 10 10*3/uL (ref 4.0–10.5)

## 2015-06-08 LAB — URINALYSIS, ROUTINE W REFLEX MICROSCOPIC
Bilirubin Urine: NEGATIVE
HGB URINE DIPSTICK: NEGATIVE
KETONES UR: NEGATIVE
Leukocytes, UA: NEGATIVE
Nitrite: NEGATIVE
RBC / HPF: NONE SEEN (ref 0–?)
Specific Gravity, Urine: 1.015 (ref 1.000–1.030)
Total Protein, Urine: NEGATIVE
Urine Glucose: 500 — AB
Urobilinogen, UA: 0.2 (ref 0.0–1.0)
pH: 5.5 (ref 5.0–8.0)

## 2015-06-08 LAB — COMPREHENSIVE METABOLIC PANEL
ALT: 15 U/L (ref 0–53)
AST: 15 U/L (ref 0–37)
Albumin: 3.7 g/dL (ref 3.5–5.2)
Alkaline Phosphatase: 85 U/L (ref 39–117)
BUN: 13 mg/dL (ref 6–23)
CALCIUM: 9.1 mg/dL (ref 8.4–10.5)
CHLORIDE: 101 meq/L (ref 96–112)
CO2: 27 mEq/L (ref 19–32)
Creatinine, Ser: 1.04 mg/dL (ref 0.40–1.50)
GFR: 74.8 mL/min (ref >60.00)
GLUCOSE: 300 mg/dL — AB (ref 70–99)
POTASSIUM: 4.9 meq/L (ref 3.5–5.1)
SODIUM: 136 meq/L (ref 135–145)
Total Bilirubin: 0.4 mg/dL (ref 0.2–1.2)
Total Protein: 6.7 g/dL (ref 6.0–8.3)

## 2015-06-08 LAB — LIPID PANEL
CHOL/HDL RATIO: 6
Cholesterol: 205 mg/dL — ABNORMAL HIGH (ref 0–200)
HDL: 33.3 mg/dL — ABNORMAL LOW (ref >39.00)
NonHDL: 171.7
TRIGLYCERIDES: 205 mg/dL — AB (ref 0.0–149.0)
VLDL: 41 mg/dL — AB (ref 0.0–40.0)

## 2015-06-08 LAB — TSH: TSH: 1.72 u[IU]/mL (ref 0.35–4.50)

## 2015-06-08 LAB — HEMOGLOBIN A1C: Hgb A1c MFr Bld: 11.2 % — ABNORMAL HIGH (ref 4.6–6.5)

## 2015-06-08 LAB — LDL CHOLESTEROL, DIRECT: Direct LDL: 152 mg/dL

## 2015-06-08 MED ORDER — HYDROCODONE-ACETAMINOPHEN 5-325 MG PO TABS: ORAL_TABLET | ORAL | Status: DC

## 2015-06-08 MED ORDER — TIZANIDINE HCL 2 MG PO TABS: 2.0000 mg | ORAL_TABLET | Freq: Two times a day (BID) | ORAL | Status: DC | PRN

## 2015-06-08 NOTE — Progress Notes (Signed)
BP 134/62 mmHg  Pulse 49  Temp(Src) 97.8 F (36.6 C) (Oral)  Wt 238 lb 6.4 oz (108.138 kg)  SpO2 95%   CC: f/u visit  Subjective:    Patient ID: [name], male    DOB: [birth year], 69 y.o.   MRN: [id number]  HPI: [name] is a 69 y.o. male presenting on 06/08/2015 for Follow-up   R ribcageabd pain ongoing for last 2 days. Started after prolonged truck ride, pressure on armrest onto right ribcage. No fevers/chills, diarrhea, constipation, nausea/vomiting.   Weight loss noted. Not trying. Decreased appetite. Denies cough, wheezing, dyspnea. No dysphagia. Some early satiety. No hematuria. No night sweats. He had normal colonoscopy per his and wife's report.  Continued smoker.  Ongoing L hand pain hyperalgesia. Only able to sleep after rubbing vicks ointment on L hand.  DM - regularly does not check sugars.  Compliant with antihyperglycemic regimen which includes: metformin 1500mg  daily.  Denies low hypoglycemic symptoms.  Denies paresthesias. Last diabetic eye exam DUE.  Pneumovax: DUE - declines.  Prevnar: DUE - declines. Lab Results  Component Value Date   HGBA1C 11.2* 06/08/2015    OSA - not using CPAP. Had sleep study done by Dr Humphrey Rolls which I will request today. 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   Relevant past medical, surgical, family and social history reviewed and updated as indicated. Interim medical history since our last visit reviewed. Allergies and medications reviewed and updated. Current Outpatient Prescriptions on File Prior to Visit  Medication Sig  . albuterol (PROVENTIL HFA;VENTOLIN HFA) 108 (90 BASE) MCG/ACT inhaler Inhale 2 puffs into the lungs every 6 (six) hours as needed for wheezing or shortness of breath.  Marland Kitchen amiodarone (PACERONE) 200 MG tablet Take 1 tablet (200 mg total) by mouth daily.  . Coenzyme Q10 100 MG capsule Take 1 capsule (100 mg total) by mouth daily.  . diclofenac sodium (VOLTAREN) 1 % GEL Apply  1 application topically 4 (four) times daily as needed.  Marland Kitchen DIGOX 125 MCG tablet TAKE 1 TABLET BY MOUTH EVERY DAY  . fish oil-omega-3 fatty acids 1000 MG capsule Take 2 g by mouth daily.   . fluticasone (FLONASE) 50 MCG/ACT nasal spray Place 2 sprays into both nostrils daily.  . Fluticasone-Salmeterol (ADVAIR DISKUS) 500-50 MCG/DOSE AEPB INHALE 1 PUFF BY MOUTH TWICE DAILY  . furosemide (LASIX) 20 MG tablet Take 1 tablet (20 mg total) by mouth as needed. For fluid.  Marland Kitchen gabapentin (NEURONTIN) 600 MG tablet TAKE 1 TABLET BY MOUTH THREE TIMES DAILY  . glucose blood (ONE TOUCH ULTRA TEST) test strip Check blood sugar once daily and as needed. 250.60  . Glucose Blood (ONETOUCH ULTRA BLUE VI) by In Vitro route. Test blood glucose 1 time per day and as directed.   Marland Kitchen ipratropium-albuterol (DUONEB) 0.5-2.5 (3) MG/3ML SOLN Take 3 mLs by nebulization 2 (two) times daily as needed. For shortness of breath.  . metFORMIN (GLUCOPHAGE) 500 MG tablet TAKE 1 TABLET BY MOUTH EVERY MORNING AND 2 TABLETS BY MOUTH EVERY EVENING  . mupirocin cream (BACTROBAN) 2 % Apply 1 application topically 3 (three) times daily. In nose for 5 days  . nitroGLYCERIN (NITROLINGUAL) 0.4 MG/SPRAY spray Place 1 spray under the tongue every 5 (five) minutes as needed.  Marland Kitchen omeprazole (PRILOSEC) 40 MG capsule TAKE 1 CAPSULE BY MOUTH EVERY DAY  . potassium chloride (K-DUR,KLOR-CON) 10 MEQ tablet TAKE 2 TABLETS BY MOUTH TWICE DAILY   No current  facility-administered medications on file prior to visit.    Review of Systems Per HPI unless specifically indicated above     Objective:    BP 134/62 mmHg  Pulse 49  Temp(Src) 97.8 F (36.6 C) (Oral)  Wt 238 lb 6.4 oz (108.138 kg)  SpO2 95%  Wt Readings from Last 3 Encounters:  06/08/15 238 lb 6.4 oz (108.138 kg)  02/02/15 242 lb (109.77 kg)  09/04/14 257 lb (116.574 kg)    Physical Exam  Constitutional: He appears well-developed and well-nourished. No distress.  HENT:  Mouth/Throat:  Oropharynx is clear and moist. No oropharyngeal exudate.  Neck: No thyromegaly present.  Cardiovascular: Normal rate, regular rhythm, normal heart sounds and intact distal pulses.   No murmur heard. Pulmonary/Chest: Effort normal and breath sounds normal. No respiratory distress. He has no wheezes. He has no rales. He exhibits tenderness (mild discomfort to palpation lateral right ribcage).  Abdominal: Soft. Bowel sounds are normal. He exhibits no distension and no mass. There is no hepatosplenomegaly. There is no tenderness. There is no rigidity, no rebound, no guarding and negative Murphy's sign.  Musculoskeletal: He exhibits no edema.  Lymphadenopathy:    He has no cervical adenopathy.  Skin: Skin is warm and dry. No rash noted.  Psychiatric: He has a normal mood and affect.  Nursing note and vitals reviewed.  Results for orders placed or performed in visit on 06/08/15  Lipid panel  Result Value Ref Range   Cholesterol 205 (H) 0 - 200 mg/dL   Triglycerides 205.0 (H) 0.0 - 149.0 mg/dL   HDL 33.30 (L) >39.00 mg/dL   VLDL 41.0 (H) 0.0 - 40.0 mg/dL   Total CHOL/HDL Ratio 6    NonHDL 171.70   Comprehensive metabolic panel  Result Value Ref Range   Sodium 136 135 - 145 mEq/L   Potassium 4.9 3.5 - 5.1 mEq/L   Chloride 101 96 - 112 mEq/L   CO2 27 19 - 32 mEq/L   Glucose, Bld 300 (H) 70 - 99 mg/dL   BUN 13 6 - 23 mg/dL   Creatinine, Ser 1.04 0.40 - 1.50 mg/dL   Total Bilirubin 0.4 0.2 - 1.2 mg/dL   Alkaline Phosphatase 85 39 - 117 U/L   AST 15 0 - 37 U/L   ALT 15 0 - 53 U/L   Total Protein 6.7 6.0 - 8.3 g/dL   Albumin 3.7 3.5 - 5.2 g/dL   Calcium 9.1 8.4 - 10.5 mg/dL   GFR 74.80 >60.00 mL/min  TSH  Result Value Ref Range   TSH 1.72 0.35 - 4.50 uIU/mL  Hemoglobin A1c  Result Value Ref Range   Hgb A1c MFr Bld 11.2 (H) 4.6 - 6.5 %  CBC with Differential/Platelet  Result Value Ref Range   WBC 10.0 4.0 - 10.5 K/uL   RBC 5.44 4.22 - 5.81 Mil/uL   Hemoglobin 15.0 13.0 - 17.0 g/dL     HCT 46.7 39.0 - 52.0 %   MCV 85.7 78.0 - 100.0 fl   MCHC 32.2 30.0 - 36.0 g/dL   RDW 15.2 11.5 - 15.5 %   Platelets 267.0 150.0 - 400.0 K/uL   Neutrophils Relative % 65.3 43.0 - 77.0 %   Lymphocytes Relative 26.5 12.0 - 46.0 %   Monocytes Relative 5.2 3.0 - 12.0 %   Eosinophils Relative 2.5 0.0 - 5.0 %   Basophils Relative 0.5 0.0 - 3.0 %   Neutro Abs 6.5 1.4 - 7.7 K/uL   Lymphs Abs 2.6 0.7 -  4.0 K/uL   Monocytes Absolute 0.5 0.1 - 1.0 K/uL   Eosinophils Absolute 0.2 0.0 - 0.7 K/uL   Basophils Absolute 0.0 0.0 - 0.1 K/uL  Digoxin level  Result Value Ref Range   Digoxin Level 1.1 0.8 - 2.0 ug/L  LDL cholesterol, direct  Result Value Ref Range   Direct LDL 152.0 mg/dL      Assessment & Plan:   Problem List Items Addressed This Visit    Atrial fibrillation    Check digoxin level. Continue this along with amiodarone. Sounds regular today.      Relevant Orders   Digoxin level (Completed)   Bradycardia    ?amiodarone induced although only on one daily. Continue to monitor. Check digoxin level today.      COPD (chronic obstructive pulmonary disease) (Chronic)    Continue advair and prn albuterol.      RESOLVED: Essential hypertension    Hypertension seems to have resolved with weight loss. Will remove from active problem list.      Fatty liver    Check LFTs today.      Neck pain, chronic    Known bulging discs. Refilled tizanidine and hydrocodone. Due for UDS - updated today.      Relevant Medications   tiZANidine (ZANAFLEX) 2 MG tablet   HYDROcodone-acetaminophen (NORCO/VICODIN) 5-325 MG per tablet   OSA (obstructive sleep apnea)    Reviewed sleep study in chart from 02/2012 by Dr Humphrey Rolls - AHI 18.6, mod OSA with desat to 74%. Marked snoring consider ENT eval.  This was when he weighed significantly more.  Pt had previously declined CPAP machine, but now interested in mini-CPAP to have available while he travels.  Will refer to [facility] pulm to establish.        Tobacco abuse    Continue to encourage cessation. precontemplative.      Type 2 diabetes, uncontrolled, with neuropathy    Declines pneumococcal vaccinations. Does not check sugars. Due for eye exam and labwork - check A1c today. Reports compliance with metformin 1500mg  daily.      Relevant Orders   Lipid panel (Completed)   Hemoglobin A1c (Completed)   Weight loss - Primary    Progressive weight loss noted over last several years from peak 11/2011 (309lbs) to current nadir of 238lbs today - without trying.  Will commence workup with CXR, UA, CBC, TSH, CMP.  Pt reports colonoscopy normal and UTD. No records of this. Will send iFOB.      Relevant Orders   Comprehensive metabolic panel (Completed)   TSH (Completed)   CBC with Differential/Platelet (Completed)   DG Chest 2 View (Completed)   Urinalysis, Routine w reflex microscopic (not at [facility]) (Completed)    Other Visit Diagnoses    Special screening for malignant neoplasms, colon        Relevant Orders    Fecal occult blood, imunochemical        Follow up plan: Return in about 3 months (around 09/08/2015), or as needed, for medicare wellness.

## 2015-06-08 NOTE — Telephone Encounter (Signed)
Please review for a refill. Thanks! 

## 2015-06-08 NOTE — Patient Instructions (Addendum)
labwork today Urinalysis for weight loss. Update controlled substance agreement today. I've refilled tizanadine and hydrocodone today. Return at your convenience for medicare wellness visit Xray today of chest.

## 2015-06-08 NOTE — Progress Notes (Signed)
Pre visit review using our clinic review tool, if applicable. No additional management support is needed unless otherwise documented below in the visit note. 

## 2015-06-09 ENCOUNTER — Telehealth: Payer: Self-pay | Admitting: Family Medicine

## 2015-06-09 ENCOUNTER — Encounter: Payer: Self-pay | Admitting: Family Medicine

## 2015-06-09 LAB — DIGOXIN LEVEL: DIGOXIN LVL: 1.1 ug/L (ref 0.8–2.0)

## 2015-06-09 NOTE — Assessment & Plan Note (Addendum)
Declines pneumococcal vaccinations. Does not check sugars. Due for eye exam and labwork - check A1c today. Reports compliance with metformin 1500mg  daily.

## 2015-06-09 NOTE — Assessment & Plan Note (Signed)
Check LFTs today. 

## 2015-06-09 NOTE — Telephone Encounter (Signed)
plz notify I reviewed sleep study from 2013 by Dr Humphrey Rolls. He did have mod sleep apnea.  I recommend referral to a [place] sleep doctor to prescribe CPAP - discuss different mask options including miniCPAP for travel. Referral placed.

## 2015-06-09 NOTE — Assessment & Plan Note (Signed)
Hypertension seems to have resolved with weight loss. Will remove from active problem list.

## 2015-06-09 NOTE — Assessment & Plan Note (Signed)
Reviewed sleep study in chart from 02/2012 by Dr Humphrey Rolls - AHI 18.6, mod OSA with desat to 74%. Marked snoring consider ENT eval.  This was when he weighed significantly more.  Pt had previously declined CPAP machine, but now interested in mini-CPAP to have available while he travels.  Will refer to [facility] pulm to establish.

## 2015-06-09 NOTE — Assessment & Plan Note (Addendum)
Progressive weight loss noted over last several years from peak 11/2011 (309lbs) to current nadir of 238lbs today - without trying.  Will commence workup with CXR, UA, CBC, TSH, CMP.  Pt reports colonoscopy normal and UTD. No records of this. Will send iFOB.

## 2015-06-09 NOTE — Assessment & Plan Note (Deleted)
Continue to encourage cessation. precontemplative. 

## 2015-06-09 NOTE — Assessment & Plan Note (Signed)
Continue to encourage cessation. precontemplative. 

## 2015-06-09 NOTE — Assessment & Plan Note (Signed)
?  amiodarone induced although only on one daily. Continue to monitor. Check digoxin level today.

## 2015-06-09 NOTE — Assessment & Plan Note (Signed)
Check digoxin level. Continue this along with amiodarone. Sounds regular today.

## 2015-06-09 NOTE — Assessment & Plan Note (Signed)
Continue advair and prn albuterol.

## 2015-06-09 NOTE — Assessment & Plan Note (Signed)
Known bulging discs. Refilled tizanidine and hydrocodone. Due for UDS - updated today.

## 2015-06-12 ENCOUNTER — Ambulatory Visit (INDEPENDENT_AMBULATORY_CARE_PROVIDER_SITE_OTHER): Payer: Medicare Other | Admitting: *Deleted

## 2015-06-12 ENCOUNTER — Other Ambulatory Visit: Payer: Self-pay | Admitting: Family Medicine

## 2015-06-12 DIAGNOSIS — I4891 Unspecified atrial fibrillation: Secondary | ICD-10-CM | POA: Diagnosis not present

## 2015-06-12 LAB — POCT INR: INR: 3.4

## 2015-06-12 MED ORDER — WARFARIN SODIUM 5 MG PO TABS: ORAL_TABLET | ORAL | Status: DC

## 2015-06-12 NOTE — Telephone Encounter (Signed)
Please review for refill, Thank you. 

## 2015-06-12 NOTE — Telephone Encounter (Signed)
Pt wife called back. [place] does want to proceed with the referral.  Scheduled 10/01/15 with Dr. Annamaria Boots at [facility]

## 2015-06-12 NOTE — Telephone Encounter (Signed)
Spoke with patients wife on the phone. [name] wants to think about the referral and will let us know if he wants to proceed with pulmonary consult.

## 2015-06-12 NOTE — Telephone Encounter (Signed)
06/12/15-Left generic message on pt cell phone to return my call. No vm set up on home phone.  This is regarding pulmonary referral.

## 2015-06-15 ENCOUNTER — Telehealth: Payer: Self-pay | Admitting: Family Medicine

## 2015-06-15 NOTE — Telephone Encounter (Signed)
Spoke with patient's wife.

## 2015-06-15 NOTE — Telephone Encounter (Signed)
Patient's wife returned Kim's call.

## 2015-06-20 ENCOUNTER — Telehealth: Payer: Self-pay | Admitting: Cardiovascular Disease

## 2015-06-20 ENCOUNTER — Ambulatory Visit: Payer: Medicare Other | Admitting: Cardiovascular Disease

## 2015-06-20 ENCOUNTER — Ambulatory Visit (INDEPENDENT_AMBULATORY_CARE_PROVIDER_SITE_OTHER): Payer: Medicare Other | Admitting: Cardiovascular Disease

## 2015-06-20 ENCOUNTER — Ambulatory Visit (INDEPENDENT_AMBULATORY_CARE_PROVIDER_SITE_OTHER): Payer: Medicare Other

## 2015-06-20 ENCOUNTER — Encounter: Payer: Self-pay | Admitting: Cardiovascular Disease

## 2015-06-20 VITALS — BP 112/80 | HR 51 | Ht 73.0 in | Wt 238.0 lb

## 2015-06-20 DIAGNOSIS — E1140 Type 2 diabetes mellitus with diabetic neuropathy, unspecified: Secondary | ICD-10-CM

## 2015-06-20 DIAGNOSIS — E1165 Type 2 diabetes mellitus with hyperglycemia: Secondary | ICD-10-CM

## 2015-06-20 DIAGNOSIS — E785 Hyperlipidemia, unspecified: Secondary | ICD-10-CM

## 2015-06-20 DIAGNOSIS — I480 Paroxysmal atrial fibrillation: Secondary | ICD-10-CM

## 2015-06-20 DIAGNOSIS — IMO0002 Reserved for concepts with insufficient information to code with codable children: Secondary | ICD-10-CM

## 2015-06-20 DIAGNOSIS — I4891 Unspecified atrial fibrillation: Secondary | ICD-10-CM

## 2015-06-20 DIAGNOSIS — I714 Abdominal aortic aneurysm, without rupture, unspecified: Secondary | ICD-10-CM

## 2015-06-20 DIAGNOSIS — G4733 Obstructive sleep apnea (adult) (pediatric): Secondary | ICD-10-CM | POA: Diagnosis not present

## 2015-06-20 DIAGNOSIS — J449 Chronic obstructive pulmonary disease, unspecified: Secondary | ICD-10-CM | POA: Diagnosis not present

## 2015-06-20 DIAGNOSIS — Z720 Tobacco use: Secondary | ICD-10-CM

## 2015-06-20 LAB — POCT INR: INR: 2.2

## 2015-06-20 NOTE — Patient Instructions (Addendum)
You are doing well. Heart rate is slow  Please stop the digoxin Monitor your heart rate If it continues to run slow, We may need to cut the amiodarone in 1/2 daily Call the office  Please call us if you have new issues that need to be addressed before your next appt.  Your physician wants you to follow-up in: 6 months.  You will receive a reminder letter in the mail two months in advance. If you don't receive a letter, please call our office to schedule the follow-up appointment.

## 2015-06-20 NOTE — Assessment & Plan Note (Signed)
He has not been seen in over one year for his AAA. We'll call the patient to schedule a follow-up in [place] with vascular surgery

## 2015-06-20 NOTE — Telephone Encounter (Signed)
Left message on pt's vm w/ Dr. Donivan Scull recommendation.  Asked him to call back w/ any questions or if we can be of any assistance.

## 2015-06-20 NOTE — Assessment & Plan Note (Signed)
Poorly controlled diabetes. Long discussion today concerning the dietary changes needed Currently not on Amaryl
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
## 2015-06-20 NOTE — Telephone Encounter (Signed)
Can we call the patient and remind him that he needs a follow-up appointment and ultrasound with vascular surgery in [place]. He has a large AAA that cannot go unmonitored.

## 2015-06-20 NOTE — Assessment & Plan Note (Signed)
Maintaining normal sinus rhythm. He is bothered by the bradycardia. Unclear if this fatigue is from poor sleep hygiene or bradycardia. We will hold the digoxin. If heart rate continues to run slow, not to his liking despite adequate blood pressure, we could cut the amiodarone down to 100 mg daily

## 2015-06-20 NOTE — Assessment & Plan Note (Signed)
Cholesterol is at goal on the current lipid regimen. No changes to the medications were made.  

## 2015-06-20 NOTE — Assessment & Plan Note (Signed)
He has follow-up with pulmonary. He would like a CPAP that he can use when he sleeps in his truck at work. Currently does not have a machine at home Likely responsible for some of his daytime somnolence

## 2015-06-20 NOTE — Assessment & Plan Note (Signed)
He continues to smoke one pack per day. We have encouraged him to continue to work on weaning his cigarettes and smoking cessation. He will continue to work on this and does not want any assistance with chantix.  Takes his friend's inhalers with improved wheezing

## 2015-06-20 NOTE — Assessment & Plan Note (Signed)
Does not want a statin. Recommended better diabetes control, dietary changes Currently poorly controlled

## 2015-07-03 ENCOUNTER — Other Ambulatory Visit: Payer: Self-pay

## 2015-07-03 MED ORDER — HYDROCODONE-ACETAMINOPHEN 5-325 MG PO TABS: ORAL_TABLET | ORAL | Status: DC

## 2015-07-03 NOTE — Telephone Encounter (Signed)
RX printed and signed and given to KD

## 2015-07-03 NOTE — Telephone Encounter (Signed)
[name] Leath(DPR signed) left v/m requesting rx hydrocodone apap. Call when ready for pick up. Pt last seen 06/08/2015. rx last printed # 60 on 06/08/15.Please advise.

## 2015-07-03 NOTE — Telephone Encounter (Signed)
Patient notified and Rx placed up front for pick up. 

## 2015-07-09 ENCOUNTER — Other Ambulatory Visit: Payer: Self-pay | Admitting: Family Medicine

## 2015-07-10 ENCOUNTER — Other Ambulatory Visit: Payer: Self-pay | Admitting: Cardiovascular Disease

## 2015-07-10 NOTE — Telephone Encounter (Signed)
Please review for refill, Thank you. 

## 2015-07-11 ENCOUNTER — Ambulatory Visit (INDEPENDENT_AMBULATORY_CARE_PROVIDER_SITE_OTHER): Payer: Medicare Other | Admitting: *Deleted

## 2015-07-11 DIAGNOSIS — I4891 Unspecified atrial fibrillation: Secondary | ICD-10-CM

## 2015-07-11 LAB — POCT INR: INR: 2.5

## 2015-07-12 ENCOUNTER — Other Ambulatory Visit: Payer: Self-pay | Admitting: Cardiovascular Disease

## 2015-07-14 IMAGING — CT CT ANGIO CHEST-ABD-PELV
2 of 7 series · 14 of 46 positions shown, 16 images · IV contrast (isovue)
Comparison: Ultrasound 09/10/2011

CLINICAL DATA: Aortic and common iliac artery aneurysms

EXAM:
CT ANGIOGRAPHY CHEST, ABDOMEN AND PELVIS
TECHNIQUE: Multidetector CT imaging through the chest, abdomen and pelvis was
performed using the standard protocol during bolus administration of
intravenous contrast. Multiplanar reconstructed images including
MIPs were obtained and reviewed to evaluate the vascular anatomy.
CONTRAST:  125 mL Isovue 370 IV

[Series 4: axial arterial · axial · arterial · 0.94mm/px · z∈[-998,-352]mm · 11 of 361 slices shown, 13 images]
[im 19/361  soft-tissue]
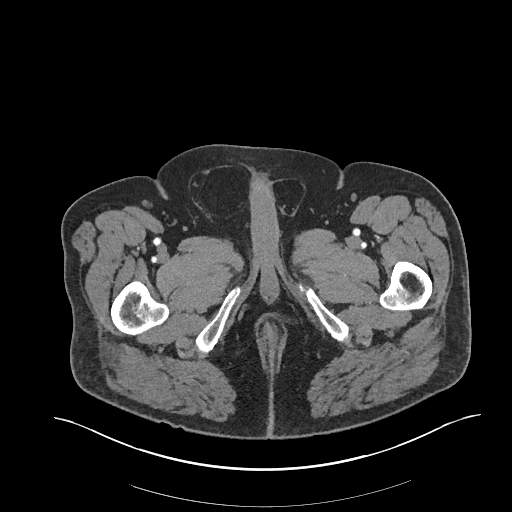
[im 19/361  bone]
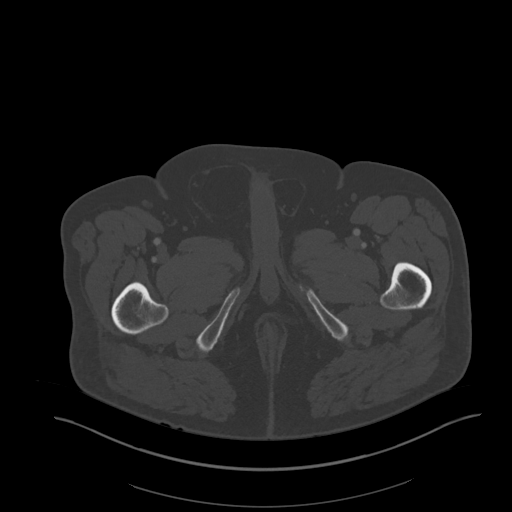
[im 57/361  soft-tissue]
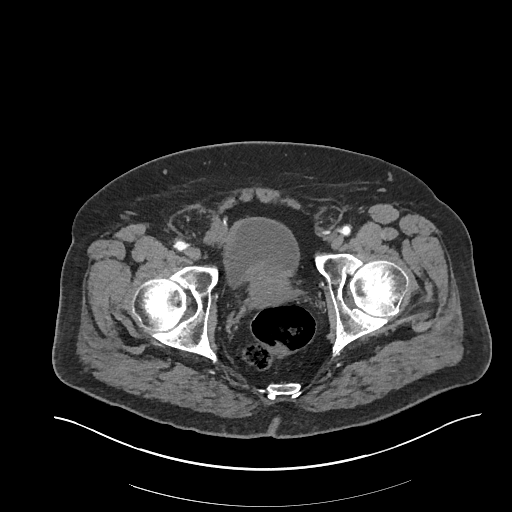
[im 95/361  soft-tissue]
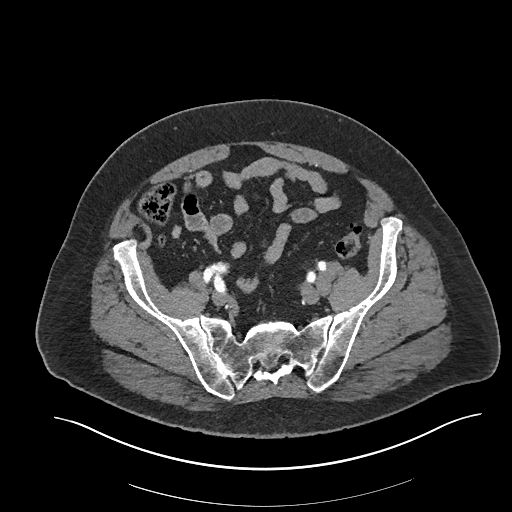
[im 114/361  soft-tissue]
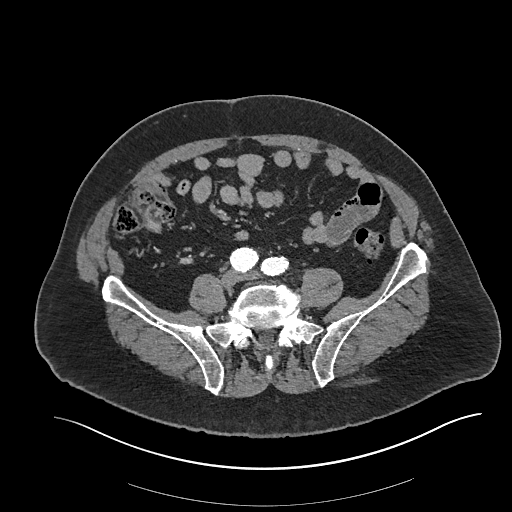
[im 152/361  soft-tissue]
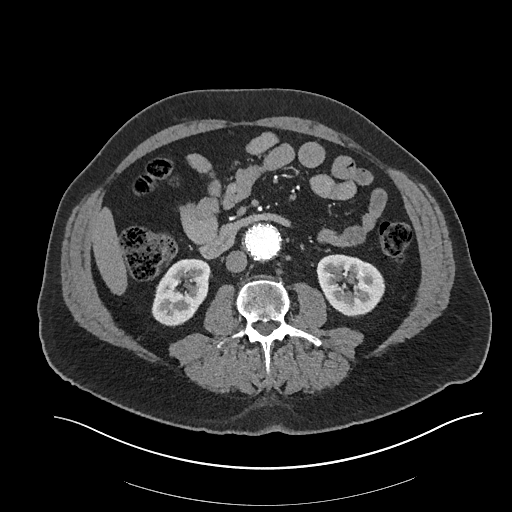
[im 190/361  soft-tissue]
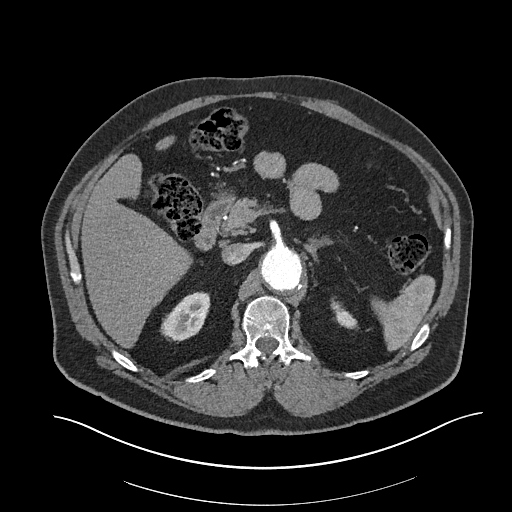
[im 209/361  soft-tissue]
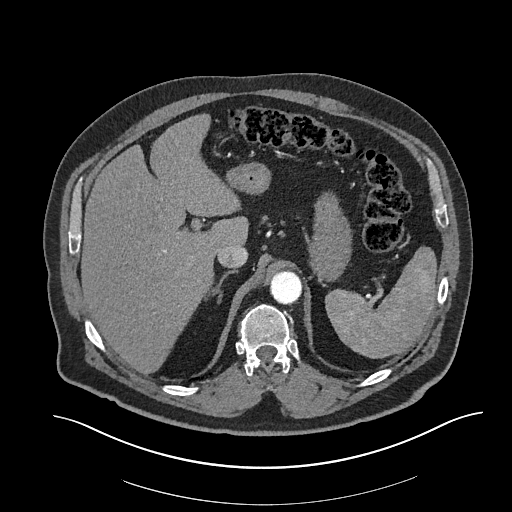
[im 247/361  soft-tissue]
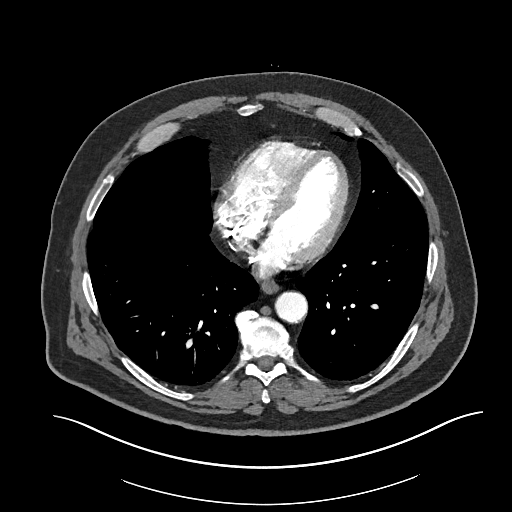
[im 266/361  soft-tissue]
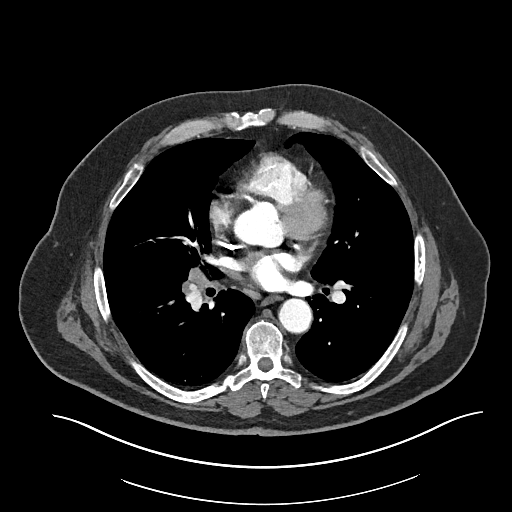
[im 266/361  bone]
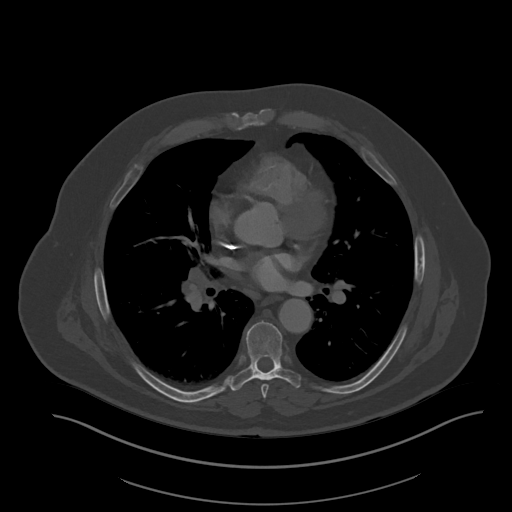
[im 304/361  soft-tissue]
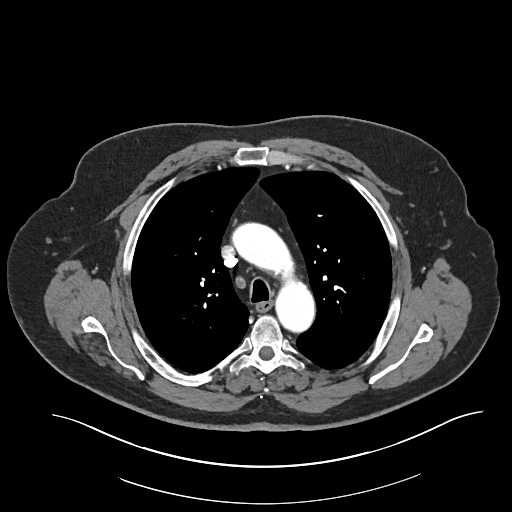
[im 342/361  soft-tissue]
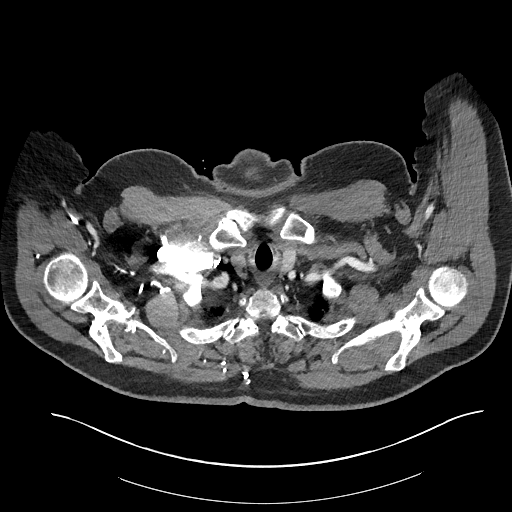

[Series 9: cor mpr · coronal · 0.90mm/px · 3 of 188 slices shown]
[im 47/188  soft-tissue]
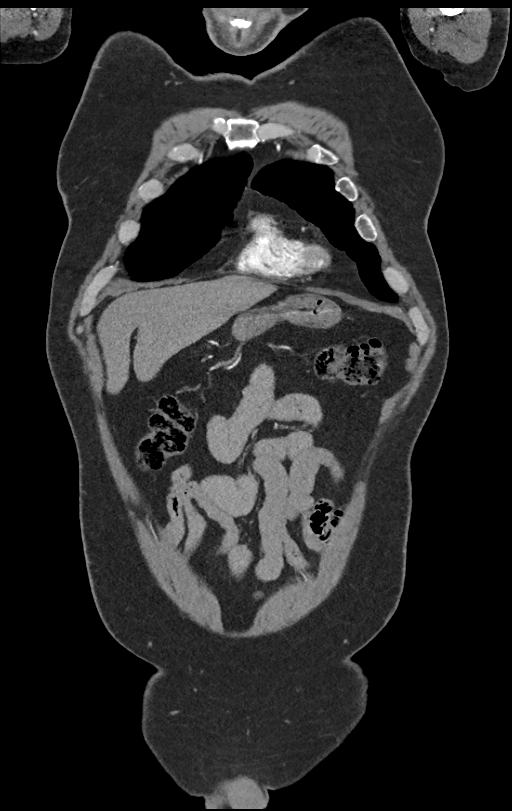
[im 94/188  soft-tissue]
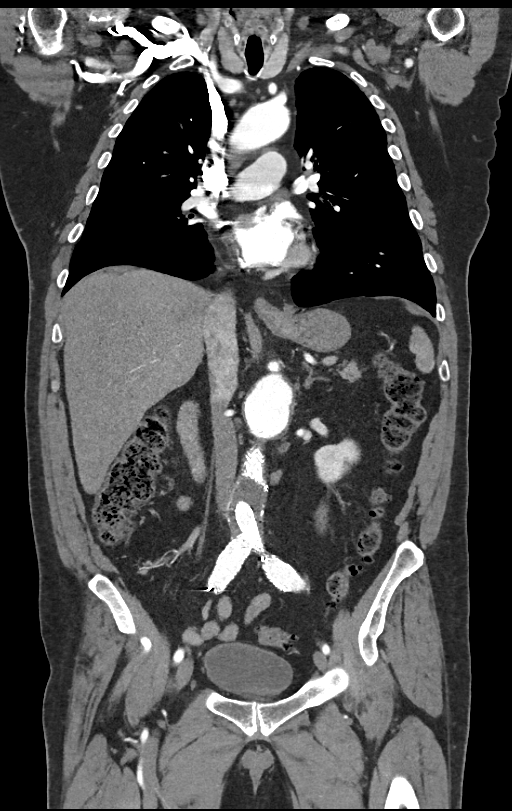
[im 141/188  soft-tissue]
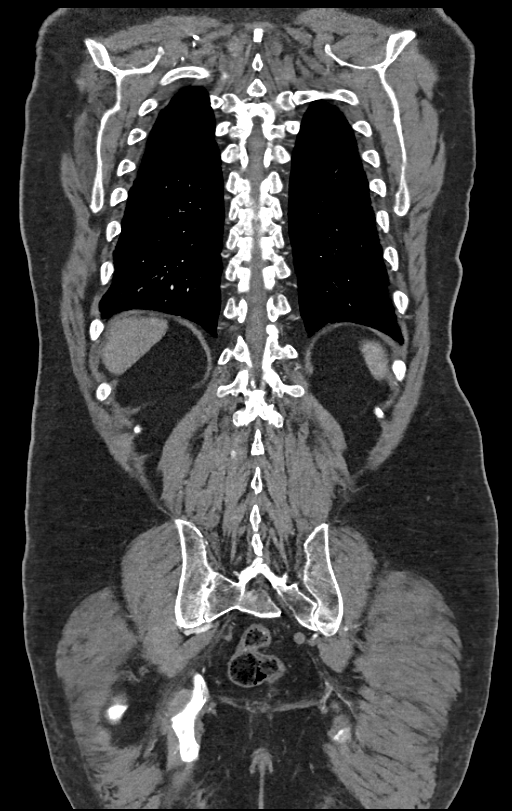

[14 of 46 positions shown; findings below may reference images not displayed]

FINDINGS: CTA CHEST FINDINGS

Coronary calcifications. No thoracic aortic aneurysm, dissection, or
stenosis. Classic 3 vessel brachiocephalic arterial origin anatomy
without proximal stenosis. There is fair contrast opacification of
pulmonary arterial branches ; the exam was not optimized for
detection of pulmonary emboli.

No pleural or pericardial effusion. Subcentimeter AP window and
right paratracheal lymph nodes. 12 mm right hilar lymph node image
96/4. Smaller left hilar, subcarinal, and pretracheal lymph nodes.
Moderately advanced emphysematous changes most marked in the apices.
No focal infiltrate or pulmonary nodule. Small spurs in the lower
thoracic spine.

Review of the MIP images confirms the above findings.

CTA ABDOMEN AND PELVIS FINDINGS
IMPRESSION: 1. Fusiform proximal abdominal aortic aneurysm, 5.3 cm diameter.
2. Infrarenal bifurcated stent graft, patent, no definite endoleak,
native sac diameter 4.8 cm.
3. 2.4 cm fusiform aneurysm of the distal native left common iliac
artery.
4. No thoracic aortic aneurysm or dissection.
5. Distal descending and sigmoid diverticulosis.

:
Arterial findings:

Aorta: Supraceliac segment unremarkable. There is fusiform
aneurysmal dilatation of the suprarenal/juxtarenal segment to a
diameter of 4.6 x 5.3 cm maximum transverse dimensions. There is an
infrarenal bifurcated stent graft which is patent. An excluded
infrarenal aneurysm sac measures approximately 4 x 4.8 cm maximum
transverse dimensions. No definite endoleak although precontrast and
delayed scans were not obtained limiting sensitivity.

Celiac axis:         Unremarkable

Superior mesenteric: Unremarkable

Left renal: Single, arising from the aneurysmal segment above the
stent graft, patent.

Right renal: Single, arising from the aneurysmal segment above the
stent graft.

Inferior mesenteric: None opacified proximally

Left iliac: Left limb of the stent graft extends to the distal
common iliac artery which is ectatic to a diameter 2.4 cm. Native
internal and external iliac arteries unremarkable.

Right iliac: Right limb of stent graft extends to the distal common
iliac. Native internal and external iliac arteries unremarkable.

Venous findings: Dedicated venouS phase imaging not obtained. Patent
bilateral renal veins noted.

Review of the MIP images confirms the above findings.

Nonvascular findings: Unremarkable arterial phase evaluation of
liver, spleen, adrenal glands, kidneys, pancreas. Surgical clips in
the gallbladder fossa. Stomach, small bowel, colon are nondilated.
Normal appendix. Scattered diverticula from distal descending and
sigmoid segments without adjacent inflammatory/edematous change.
Moderate prostatic enlargement with central coarse calcifications.
Urinary bladder incompletely distended. Bilateral inguinal hernias
containing fat, no bowel. No ascites. No free air. No adenopathy.

## 2015-07-29 ENCOUNTER — Encounter: Payer: Self-pay | Admitting: Family Medicine

## 2015-08-02 ENCOUNTER — Other Ambulatory Visit: Payer: Self-pay

## 2015-08-02 MED ORDER — HYDROCODONE-ACETAMINOPHEN 5-325 MG PO TABS: ORAL_TABLET | ORAL | Status: DC

## 2015-08-02 NOTE — Telephone Encounter (Signed)
[name] left v/m requesting rx hydrocodone apap. Call when ready for pick up. rx last printed # 60 on 07/03/15. Last seen 06/08/2015.

## 2015-08-02 NOTE — Telephone Encounter (Signed)
Printed and in Kim's box 

## 2015-08-03 NOTE — Telephone Encounter (Signed)
Patient's wife notified and Rx placed up front for pick up. 

## 2015-08-07 ENCOUNTER — Encounter: Payer: Self-pay | Admitting: Family Medicine

## 2015-08-08 ENCOUNTER — Ambulatory Visit (INDEPENDENT_AMBULATORY_CARE_PROVIDER_SITE_OTHER): Payer: Medicare Other

## 2015-08-08 DIAGNOSIS — I4891 Unspecified atrial fibrillation: Secondary | ICD-10-CM

## 2015-08-08 LAB — POCT INR: INR: 2.7

## 2015-08-14 ENCOUNTER — Other Ambulatory Visit: Payer: Self-pay | Admitting: Cardiovascular Disease

## 2015-09-04 ENCOUNTER — Other Ambulatory Visit: Payer: Self-pay | Admitting: *Deleted

## 2015-09-04 NOTE — Telephone Encounter (Signed)
Follow up 06/08/15, Rx last filled #60 on 08/02/15

## 2015-09-06 MED ORDER — HYDROCODONE-ACETAMINOPHEN 5-325 MG PO TABS: ORAL_TABLET | ORAL | Status: DC

## 2015-09-06 NOTE — Telephone Encounter (Signed)
Printed and in Kim's box 

## 2015-09-06 NOTE — Telephone Encounter (Signed)
Patient's wife notified and Rx placed up front for pick up. 

## 2015-09-07 ENCOUNTER — Other Ambulatory Visit: Payer: Self-pay | Admitting: Family Medicine

## 2015-09-13 ENCOUNTER — Other Ambulatory Visit: Payer: Self-pay | Admitting: Family Medicine

## 2015-09-26 ENCOUNTER — Ambulatory Visit (INDEPENDENT_AMBULATORY_CARE_PROVIDER_SITE_OTHER): Payer: Medicare Other

## 2015-09-26 DIAGNOSIS — I4891 Unspecified atrial fibrillation: Secondary | ICD-10-CM | POA: Diagnosis not present

## 2015-09-26 LAB — POCT INR: INR: 2

## 2015-10-01 ENCOUNTER — Ambulatory Visit (INDEPENDENT_AMBULATORY_CARE_PROVIDER_SITE_OTHER): Payer: Medicare Other | Admitting: Internal Medicine

## 2015-10-01 ENCOUNTER — Encounter: Payer: Self-pay | Admitting: Internal Medicine

## 2015-10-01 VITALS — BP 128/78 | HR 54 | Ht 73.0 in | Wt 236.0 lb

## 2015-10-01 DIAGNOSIS — J449 Chronic obstructive pulmonary disease, unspecified: Secondary | ICD-10-CM | POA: Diagnosis not present

## 2015-10-01 DIAGNOSIS — I482 Chronic atrial fibrillation, unspecified: Secondary | ICD-10-CM

## 2015-10-01 DIAGNOSIS — Z720 Tobacco use: Secondary | ICD-10-CM | POA: Diagnosis not present

## 2015-10-01 DIAGNOSIS — G4733 Obstructive sleep apnea (adult) (pediatric): Secondary | ICD-10-CM

## 2015-10-01 NOTE — Progress Notes (Signed)
10/01/15 40 yoM smoker referred courtesy of Dr Danise Mina for sleep medicine evaluation. Medical hx of COPD, AFib, DM2 Wife here          he declines flu shot NPSG 03/05/12- severe OSA, AHI 18.6/ hr with desat to 74%. Study done at [facility]  Had CPAP titration and was unable to handle mask(pt states he was in sleep lab at [facility] about 10-15 minutes and left) Patient states when he had a sleep study he weighed 350 pounds and has dieted off some weight since then. Wife indicates he does snore. He admits drowsiness driving but takes "magnesium pills" when he feels he needs to be more alert. Truck driver, former Estate manager/land agent Smoking a pack and a half a day and not prepared to stop.  Prior to Admission medications   Medication Sig Start Date End Date Taking? Authorizing Provider  amiodarone (PACERONE) 200 MG tablet TAKE 1 TABLET BY MOUTH EVERY DAY 07/12/15  Yes Minna Merritts, MD  Coenzyme Q10 100 MG capsule Take 1 capsule (100 mg total) by mouth daily. 03/25/11  Yes Minna Merritts, MD  diclofenac sodium (VOLTAREN) 1 % GEL Apply 1 application topically 4 (four) times daily as needed. 04/12/12  Yes Ria Bush, MD  fish oil-omega-3 fatty acids 1000 MG capsule Take 2 g by mouth daily.    Yes Historical Provider, MD  fluticasone (FLONASE) 50 MCG/ACT nasal spray Place 2 sprays into both nostrils daily. 05/11/14  Yes Ria Bush, MD  Fluticasone-Salmeterol (ADVAIR DISKUS) 500-50 MCG/DOSE AEPB INHALE 1 PUFF BY MOUTH TWICE DAILY 09/04/14  Yes Ria Bush, MD  furosemide (LASIX) 20 MG tablet TAKE 1 TABLET BY MOUTH AS NEEDED FOR FLUID 09/13/15  Yes Ria Bush, MD  gabapentin (NEURONTIN) 600 MG tablet TAKE 1 TABLET BY MOUTH THREE TIMES DAILY 06/12/15  Yes Ria Bush, MD  glucose blood (ONE TOUCH ULTRA TEST) test strip Check blood sugar once daily and as needed. 250.60 04/23/12  Yes Ria Bush, MD  Glucose Blood (ONETOUCH ULTRA BLUE VI) by In Vitro route. Test blood glucose 1  time per day and as directed.    Yes Historical Provider, MD  ipratropium-albuterol (DUONEB) 0.5-2.5 (3) MG/3ML SOLN Take 3 mLs by nebulization 2 (two) times daily as needed. For shortness of breath. 11/27/11  Yes Ria Bush, MD  metFORMIN (GLUCOPHAGE) 500 MG tablet TAKE 1 TABLET BY MOUTH EVERY MORNING AND 2 TABLETS EVERY EVENING 07/09/15  Yes Ria Bush, MD  mupirocin cream (BACTROBAN) 2 % Apply 1 application topically 3 (three) times daily. In nose for 5 days 05/11/14  Yes Ria Bush, MD  nitroGLYCERIN (NITROLINGUAL) 0.4 MG/SPRAY spray Place 1 spray under the tongue every 5 (five) minutes as needed.   Yes Historical Provider, MD  omeprazole (PRILOSEC) 40 MG capsule TAKE 1 CAPSULE BY MOUTH EVERY DAY 09/07/15  Yes Ria Bush, MD  potassium chloride (K-DUR,KLOR-CON) 10 MEQ tablet TAKE 2 TABLETS BY MOUTH TWICE DAILY 08/14/15  Yes Minna Merritts, MD  PROVENTIL HFA 108 (90 BASE) MCG/ACT inhaler INHALE 2 PUFFS BY MOUTH EVERY 6 HOURS AS NEEDED FOR WHEEZING OR SHORTNESS OF BREATH 09/07/15  Yes Ria Bush, MD  tiZANidine (ZANAFLEX) 2 MG tablet Take 1 tablet (2 mg total) by mouth 2 (two) times daily as needed for muscle spasms. 06/08/15  Yes Ria Bush, MD  warfarin (COUMADIN) 5 MG tablet TAKE AS DIRECTED BY COUMADIN CLINIC 07/11/15  Yes Minna Merritts, MD  HYDROcodone-acetaminophen (NORCO/VICODIN) 5-325 MG tablet TAKE 1 TABLET BY MOUTH  TWICE DAILY AS NEEDED FOR PAIN 10/02/15   Ria Bush, MD   Past Medical History  Diagnosis Date  . COPD (chronic obstructive pulmonary disease) (HCC)     mod-severe COPD/emphysema.  PFTs 12/2010.  He still smokes 1 ppd.  . Obesity   . GERD (gastroesophageal reflux disease)   . Muscle spasm     chronic  . Neuralgia     pain in hands. L>R from accident  . Osteoarthritis   . Paroxysmal atrial fibrillation (HCC)     on coumadin only.  . Hyperlipidemia     myalgias with simvastatin and atorvastatin  . T2DM (type 2 diabetes mellitus) ([place])    . Peripheral autonomic neuropathy due to diabetes mellitus ([place])   . Leg cramps     idiopathic severe  . Smoker     1ppd  . AAA (abdominal aortic aneurysm) ([place]) 2013    s/p stent graft repair now with supra/pararenal aneurysm 3.5cm, referred to Dr. Sammuel Hines at [facility] for endovascular repair (12/2013)  . Fatty liver   . Cervical neck pain with evidence of disc disease 07/2011    MRI - disk bulging and foraminal stenosis, advanced at C4/5, 5/6; rec pain management for ESI by Dr. Mack Guise   . CHF (congestive heart failure) ([place])   . Depression   . ED (erectile dysfunction) 02/2012    penile injections - failed viagra, poor arterial flow ([place])  . OSA (obstructive sleep apnea) 03/2012    AHI 18.6, desat to 74%, severe snoring, consider ENT eval  . Right shoulder injury 05/2012    after fall out of chair, s/p injection, rec conservative management with PT Noemi Chapel)  . Abnormal drug screen 06/2015    inapprop neg norco (06/2015)   Past Surgical History  Procedure Laterality Date  . Tonsillectomy    . Cholecystectomy  2001  . Knee surgery      L side cartilage taken out  . Pfts  12/2010    mod-severe obstruction, ?bronchodilator response  . Cta abd  09/2011    6.1cm AAA, bilateral ing hernias, R with bladder wall, promient prostate calcifications  . Endovascular stent insertion  11/11/2011    Procedure: ENDOVASCULAR STENT GRAFT INSERTION;  Surgeon: Angelia Mould, MD;  Location: [facility] OR;  Service: Vascular;  Laterality: N/A;  aorta bi iliac   Family History  Problem Relation Age of Onset  . Heart disease Father   . Leukemia Father   . Coronary artery disease Father   . Melanoma Sister   . Diabetes Neg Hx   . Stroke Neg Hx   . Arthritis Mother    Social History   Social History  . Marital Status: Married    Spouse Name: Juliann Pulse  . Number of Children: 3  . Years of Education: 12th gr   Occupational History  . truck driver     for years  . 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 Smoking status: Current Every Day Smoker -- 1.50 packs/day for 52 years    Types: Cigarettes  . Smokeless tobacco: Never Used  . Alcohol Use: 0.0 oz/week    0 Standard drinks or equivalent per week     Comment: occasional, 1 glass margarita or glass of wine once every 6 months; occasional beer with tomato juice  . Drug Use: No  . Sexual Activity: Not on file   Other Topics Concern  . Not on file  Social History Narrative   Caffeine: 1 cup coffee, 1/2 gallon unsweet tea, 2 soda/day   Lives with wife and 1 dog, 6 cats outside.   Previous long distance driver   H/o medical self neglect, started seeking care when received medicare.   H/o noncompliance, h/o confrontation with staff   ROS-see HPI   Negative unless "+" Constitutional:    + weight loss, night sweats, fevers, chills, fatigue, lassitude. HEENT:    headaches, difficulty swallowing, tooth/dental problems, sore throat,       sneezing, itching, ear ache, nasal congestion, post nasal drip, snoring CV:    chest pain, orthopnea, PND, swelling in lower extremities, anasarca,                                                dizziness, palpitations Resp:   shortness of breath with exertion or at rest.                + productive cough,   non-productive cough, coughing up of blood.              change in color of mucus.  wheezing.   Skin:    rash or lesions. GI:  No-   heartburn, indigestion, abdominal pain, nausea, vomiting, diarrhea,                 change in bowel habits, loss of appetite GU: dysuria, change in color of urine, no urgency or frequency.   flank pain. MS:   joint pain, stiffness, decreased range of motion, back pain. Neuro-     nothing unusual Psych:  change in mood or affect.  depression or anxiety.   memory loss.  OBJ- Physical Exam General- Alert, Oriented, Affect-appropriate, Distress- none acute Skin- rash-none, lesions- none, excoriation- none Lymphadenopathy-  none Head- atraumatic            Eyes- Gross vision intact, PERRLA, conjunctivae and secretions clear            Ears- Hearing, canals-normal            Nose- Clear, no-Septal dev, mucus, polyps, erosion, perforation             Throat- Mallampati II , mucosa clear , drainage- none, tonsils- atrophic, + edentulous Neck- flexible , trachea midline, no stridor , thyroid nl, carotid no bruit Chest - symmetrical excursion , unlabored           Heart/CV- IRR , no murmur , no gallop  , no rub, nl s1 s2                           - JVD- none , edema- none, stasis changes- none, varices- none           Lung-  wheeze + bilateral mild unlabored, cough- none , dullness-none, rub- none           Chest wall-  Abd-  Br/ Gen/ Rectal- Not done, not indicated Extrem- cyanosis- none, clubbing, none, atrophy- none, strength- nl Neuro- grossly intact to observation

## 2015-10-01 NOTE — Patient Instructions (Signed)
Order- Schedule unattended ome sleep test   Dx OSA  I suggest you stop smoking.

## 2015-10-02 ENCOUNTER — Encounter: Payer: Self-pay | Admitting: Internal Medicine

## 2015-10-02 ENCOUNTER — Other Ambulatory Visit: Payer: Self-pay | Admitting: *Deleted

## 2015-10-02 MED ORDER — HYDROCODONE-ACETAMINOPHEN 5-325 MG PO TABS: ORAL_TABLET | ORAL | Status: DC

## 2015-10-02 NOTE — Telephone Encounter (Signed)
Patient's wife notified and Rx placed up front for pick up. 

## 2015-10-02 NOTE — Assessment & Plan Note (Signed)
Active smoker despite counseling. I don't have current information about PFTs or other specific evaluation which might be pertinent

## 2015-10-02 NOTE — Telephone Encounter (Signed)
Printed and in Kim's box 

## 2015-10-02 NOTE — Assessment & Plan Note (Signed)
Resistant to change. He has bronchitic findings on exam now with wheeze and some cough. He was referred for sleep apnea. It remains to be seen if his primary physician would like our help with his lung disease.

## 2015-10-02 NOTE — Assessment & Plan Note (Signed)
He is strongly resistant to trying a full face CPAP mask again, described claustrophobia with this. After discussion, it seems best to update documentation of sleep apnea. I think we can use an unattended home study. We can then discuss whether another try with CPAP or discussion with a provider of oral appliances about what his edentulous status could permit

## 2015-10-02 NOTE — Telephone Encounter (Signed)
Last f/u 06/2015

## 2015-10-02 NOTE — Assessment & Plan Note (Signed)
He remains in atrial fibrillation at this visit with fair ventricular rate control

## 2015-10-16 ENCOUNTER — Telehealth: Payer: Self-pay | Admitting: Internal Medicine

## 2015-10-16 NOTE — Telephone Encounter (Signed)
Per [name], pt has refused HST, for any further info pt may be reached at [phone]

## 2015-10-16 NOTE — Telephone Encounter (Signed)
Patient's spouse came to pick up the HST machine from me on 10/15/15.  I gave her the instructions for setting the machine up.  Patient's spouse then called back a short time later stating that patient was declining doing the HST.  She advised that she would be sure the machine was returned to our office the next morning on 10/16/15.  I advised patient's spouse that I would have Clayborne Dana, [place] for Dr. Annamaria Boots to cancel the ROV on 10/29/15 for the patient.  This was to be a follow up visit to discuss the HST results.  Patient's spouse voiced understanding of this.  The machine was returned and I did check to be sure that pt did not do the HST.  There was no data for the patient on the HST machine.  I have sent a staff message to Clayborne Dana, [place] to cancel the 10/29/15 appt for this pt.

## 2015-10-17 NOTE — Telephone Encounter (Signed)
HST and ov have been canceled Nothing further needed Will sign off on message.

## 2015-10-17 NOTE — Telephone Encounter (Signed)
Appt was already cancelled. FYI to Dr. Annamaria Boots.

## 2015-10-17 NOTE — Telephone Encounter (Signed)
Ok to cancel HST order and cancel office f/u visit

## 2015-10-29 ENCOUNTER — Ambulatory Visit: Payer: Medicare Other | Admitting: Internal Medicine

## 2015-11-02 ENCOUNTER — Other Ambulatory Visit: Payer: Self-pay | Admitting: Cardiovascular Disease

## 2015-11-08 ENCOUNTER — Other Ambulatory Visit: Payer: Self-pay

## 2015-11-08 MED ORDER — HYDROCODONE-ACETAMINOPHEN 5-325 MG PO TABS: ORAL_TABLET | ORAL | Status: DC

## 2015-11-08 NOTE — Telephone Encounter (Signed)
[name] (DPR signed) left v/m requesting rx hydrocodone apap. Call when ready for pick up. rx last printed # 60 on 10/02/15. Last seen f/u 06/08/15.

## 2015-11-08 NOTE — Telephone Encounter (Signed)
Printed.  Thanks.  

## 2015-11-09 NOTE — Telephone Encounter (Signed)
Rx left in front office for pick up and wife is aware

## 2015-11-14 ENCOUNTER — Ambulatory Visit (INDEPENDENT_AMBULATORY_CARE_PROVIDER_SITE_OTHER): Payer: Medicare Other

## 2015-11-14 DIAGNOSIS — I4891 Unspecified atrial fibrillation: Secondary | ICD-10-CM

## 2015-11-14 LAB — POCT INR: INR: 2.1

## 2015-11-15 ENCOUNTER — Other Ambulatory Visit: Payer: Self-pay | Admitting: Family Medicine

## 2015-11-15 MED ORDER — DICLOFENAC SODIUM 1 % TD GEL: 1.0000 "application " | Freq: Three times a day (TID) | TRANSDERMAL | Status: DC | PRN

## 2015-11-21 ENCOUNTER — Other Ambulatory Visit: Payer: Self-pay | Admitting: Family Medicine

## 2015-11-21 NOTE — Telephone Encounter (Signed)
Ok to refill 

## 2015-11-23 ENCOUNTER — Telehealth: Payer: Self-pay

## 2015-11-23 NOTE — Telephone Encounter (Signed)
Ronalee Belts with [employer] left v/m requesting status of PA for diclofenac gel 1 % that has previously been faxed to [facility]; ref # [id number].

## 2015-11-28 ENCOUNTER — Other Ambulatory Visit: Payer: Self-pay | Admitting: Cardiovascular Disease

## 2015-11-29 NOTE — Telephone Encounter (Signed)
Please review for refill, Thank you. 

## 2015-12-06 ENCOUNTER — Other Ambulatory Visit: Payer: Self-pay

## 2015-12-06 NOTE — Telephone Encounter (Signed)
[name] (DPR signed) left v/m requesting rx hydrocodone apap. Call when ready for pick up.last printed # 60 on 11/08/15. Last seen 06/08/15. Dr Darnell Level out of office and note sent to Dr Damita Dunnings.

## 2015-12-06 NOTE — Telephone Encounter (Signed)
PA denied.

## 2015-12-07 ENCOUNTER — Other Ambulatory Visit: Payer: Self-pay | Admitting: Cardiovascular Disease

## 2015-12-07 MED ORDER — HYDROCODONE-ACETAMINOPHEN 5-325 MG PO TABS: ORAL_TABLET | ORAL | Status: DC

## 2015-12-07 NOTE — Telephone Encounter (Signed)
Printed.  Thanks.  

## 2015-12-07 NOTE — Telephone Encounter (Signed)
Wife advised.  Rx left at front desk for pick up.  

## 2015-12-18 ENCOUNTER — Other Ambulatory Visit: Payer: Self-pay | Admitting: Family Medicine

## 2015-12-18 ENCOUNTER — Ambulatory Visit (INDEPENDENT_AMBULATORY_CARE_PROVIDER_SITE_OTHER): Payer: Medicare Other | Admitting: Family Medicine

## 2015-12-18 ENCOUNTER — Encounter: Payer: Self-pay | Admitting: Family Medicine

## 2015-12-18 ENCOUNTER — Ambulatory Visit (INDEPENDENT_AMBULATORY_CARE_PROVIDER_SITE_OTHER)
Admission: RE | Admit: 2015-12-18 | Discharge: 2015-12-18 | Disposition: A | Discharge status: Home / Self Care | Payer: Medicare Other | Source: Ambulatory Visit | Attending: Family Medicine | Admitting: Family Medicine

## 2015-12-18 VITALS — BP 136/82 | HR 80 | Temp 98.3°F | Wt 246.8 lb

## 2015-12-18 DIAGNOSIS — M545 Low back pain: Secondary | ICD-10-CM

## 2015-12-18 DIAGNOSIS — S3992XA Unspecified injury of lower back, initial encounter: Secondary | ICD-10-CM

## 2015-12-18 DIAGNOSIS — M25562 Pain in left knee: Secondary | ICD-10-CM | POA: Diagnosis not present

## 2015-12-18 DIAGNOSIS — G8929 Other chronic pain: Secondary | ICD-10-CM | POA: Insufficient documentation

## 2015-12-18 MED ORDER — OXYCODONE-ACETAMINOPHEN 5-325 MG PO TABS: 1.0000 | ORAL_TABLET | Freq: Three times a day (TID) | ORAL | Status: DC | PRN

## 2015-12-18 MED ORDER — TIZANIDINE HCL 4 MG PO TABS: ORAL_TABLET | ORAL | Status: DC

## 2015-12-18 NOTE — Assessment & Plan Note (Signed)
Possible meniscal injury after fall given positive mcmurray's sign and medial pain to palpation. rec for now continued knee brace, rest, narcotic and muscle relaxant. Avoid NSAIDs due to coumadin use. Update if no better for further evaluation. Pt agrees.

## 2015-12-18 NOTE — Progress Notes (Signed)
Pre visit review using our clinic review tool, if applicable. No additional management support is needed unless otherwise documented below in the visit note. 

## 2015-12-18 NOTE — Assessment & Plan Note (Addendum)
Suffered fall out of truck onto cement with ice/snow landing on back.  Anticipate bony contusion of cervical and lumbar/sacral spine. Check xray of cervical, lumbar, sacral spine to r/o vertebral fractures.  Treat with oxycodone 5mg  course #50 provided today, increase zanaflex to 4mg  BID PRN - discussed sedation precautions with this and not to mix with narcotic.  Endorses some paresthesias but no obvious neurological signs of weakness/loss of sensation noted on exam today. Merits close following - rec return in 10d for recheck. Pt and wife agree with plan.

## 2015-12-18 NOTE — Progress Notes (Signed)
BP 136/82 mmHg  Pulse 80  Temp(Src) 98.3 F (36.8 C) (Oral)  Wt 246 lb 12 oz (111.925 kg)   CC: fall backwards on Sunday  Subjective:    Patient ID: [name], male    DOB: 29-Nov-1946, 70 y.o.   MRN: [id number]  HPI: [name] is a 70 y.o. 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 Landed on lower back and buttock, also hit back of head, took his breath away. Worked Sun and Mon but so sore today he could not go to work. Since then sore all over. Pain in bottom, in neck, in knees, lower back, shoulders and feet, left hand pain as well as some paresthesias and shock like pain down right arm. Twisted left knee when he fell. Some leg cramping and pain down bilateral legs. No numbness or weakness of legs. No chest pain, abd pain, dyspnea.   Trouble going through [employer]. Comes in today for evaluation. Has contacted his lawyer. Having more trouble walking - bought new knee braces.  Taking hydrocodone (usually BID PRN, currently taking Q4 hours), zanaflex bid. Taking tylenol as well.   On coumadin.  Lab Results  Component Value Date   INR 2.1 11/14/2015   INR 2.0 09/26/2015   INR 2.7 08/08/2015    Relevant past medical, surgical, family and social history reviewed and updated as indicated. Interim medical history since our last visit reviewed. Allergies and medications reviewed and updated. Current Outpatient Prescriptions on File Prior to Visit  Medication Sig  . amiodarone (PACERONE) 200 MG tablet TAKE 1 TABLET BY MOUTH EVERY DAY  . Coenzyme Q10 100 MG capsule Take 1 capsule (100 mg total) by mouth daily.  . fish oil-omega-3 fatty acids 1000 MG capsule Take 2 g by mouth daily.   . fluticasone (FLONASE) 50 MCG/ACT nasal spray Place 2 sprays into both nostrils daily.  . Fluticasone-Salmeterol (ADVAIR DISKUS) 500-50 MCG/DOSE AEPB INHALE 1 PUFF BY MOUTH TWICE DAILY    . furosemide (LASIX) 20 MG tablet TAKE 1 TABLET BY MOUTH AS NEEDED FOR FLUID  . gabapentin (NEURONTIN) 600 MG tablet TAKE 1 TABLET BY MOUTH THREE TIMES DAILY  . glucose blood (ONE TOUCH ULTRA TEST) test strip Check blood sugar once daily and as needed. 250.60  . Glucose Blood (ONETOUCH ULTRA BLUE VI) by In Vitro route. Test blood glucose 1 time per day and as directed.   Marland Kitchen HYDROcodone-acetaminophen (NORCO/VICODIN) 5-325 MG tablet TAKE 1 TABLET BY MOUTH TWICE DAILY AS NEEDED FOR PAIN  . metFORMIN (GLUCOPHAGE) 500 MG tablet TAKE 1 TABLET BY MOUTH EVERY MORNING AND 2 TABLETS EVERY EVENING  . nitroGLYCERIN (NITROLINGUAL) 0.4 MG/SPRAY spray Place 1 spray under the tongue every 5 (five) minutes as needed.  Marland Kitchen omeprazole (PRILOSEC) 40 MG capsule TAKE 1 CAPSULE BY MOUTH EVERY DAY  . potassium chloride (K-DUR,KLOR-CON) 10 MEQ tablet TAKE 2 TABLETS BY MOUTH TWICE DAILY  . PROVENTIL HFA 108 (90 BASE) MCG/ACT inhaler INHALE 2 PUFFS BY MOUTH EVERY 6 HOURS AS NEEDED FOR WHEEZING OR SHORTNESS OF BREATH  . warfarin (COUMADIN) 5 MG tablet TAKE AS DIRECTED BY COUMADIN CLINIC  . diclofenac sodium (VOLTAREN) 1 % GEL Apply 1 application topically 3 (three) times daily as needed. (Patient not taking: Reported on 12/18/2015)  . ipratropium-albuterol (DUONEB) 0.5-2.5 (3) MG/3ML SOLN Take 3 mLs by nebulization 2 (two) times daily as needed.  For shortness of breath. (Patient not taking: Reported on 12/18/2015)   No current facility-administered medications on file prior to visit.    Review of Systems Per HPI unless specifically indicated in ROS section     Objective:    BP 136/82 mmHg  Pulse 80  Temp(Src) 98.3 F (36.8 C) (Oral)  Wt 246 lb 12 oz (111.925 kg)  Wt Readings from Last 3 Encounters:  12/18/15 246 lb 12 oz (111.925 kg)  10/01/15 236 lb (107.049 kg)  06/20/15 238 lb (107.956 kg)    Physical Exam  Constitutional: He is oriented to person, place, and time. He appears well-developed and well-nourished.  No distress.  Musculoskeletal: He exhibits edema (1+ pitting).  Limited ROM at neck 2/2 pain (mainly pain at L trapezius with R lateral flexion).  + pain midline spine cervical region and max pain lower lumbar region/sacrum + lower lumbar paraspinous mm tenderness Neg SLR bilaterally. No pain with int/ext rotation at hip. Neg FABER. L knee WNL R Knee exam: No deformity on inspection. Pain with palpation of medial knee No effusion/swelling noted. FROM in flex/extension without crepitus. No popliteal fullness. Neg drawer test. + mcmurray test.  Neurological: He is alert and oriented to person, place, and time. He has normal strength. No sensory deficit.  5/5 strength BLE  Skin: Skin is warm and dry. No bruising and no rash noted.  Psychiatric: He has a normal mood and affect.  Nursing note and vitals reviewed.  Results for orders placed or performed in visit on 11/14/15  POCT INR  Result Value Ref Range   INR 2.1       Assessment & Plan:   Problem List Items Addressed This Visit    Left knee pain    Possible meniscal injury after fall given positive mcmurray's sign and medial pain to palpation. rec for now continued knee brace, rest, narcotic and muscle relaxant. Avoid NSAIDs due to coumadin use. Update if no better for further evaluation. Pt agrees.      Back injury - Primary    Suffered fall out of truck onto cement with ice/snow landing on back.  Anticipate bony contusion of cervical and lumbar/sacral spine. Check xray of cervical, lumbar, sacral spine to r/o vertebral fractures.  Treat with oxycodone 5mg  course #50 provided today, increase zanaflex to 4mg  BID PRN - discussed sedation precautions with this and not to mix with narcotic.  Endorses some paresthesias but no obvious neurological signs of weakness/loss of sensation noted on exam today. Merits close following - rec return in 10d for recheck. Pt and wife agree with plan.      Relevant Orders   DG Cervical Spine  Complete   DG Lumbar Spine Complete   DG Sacrum/Coccyx       Follow up plan: Return in about 10 days (around 12/28/2015), or as needed, for follow up visit.

## 2015-12-18 NOTE — Patient Instructions (Addendum)
I think you had bad bony bruising of back and possible knee ligament injury.  Treat with increased zanaflex to 4mg  twice daily and oxycodone up to three times daily for pain.  Return to see me in 10-14 days for recheck.

## 2015-12-26 ENCOUNTER — Other Ambulatory Visit: Payer: Self-pay | Admitting: Family Medicine

## 2015-12-26 ENCOUNTER — Ambulatory Visit (INDEPENDENT_AMBULATORY_CARE_PROVIDER_SITE_OTHER): Payer: Medicare Other | Admitting: *Deleted

## 2015-12-26 DIAGNOSIS — I4891 Unspecified atrial fibrillation: Secondary | ICD-10-CM | POA: Diagnosis not present

## 2015-12-26 LAB — POCT INR: INR: 2.3

## 2016-01-01 ENCOUNTER — Emergency Department
Admission: EM | Admit: 2016-01-01 | Discharge: 2016-01-02 | Disposition: A | Discharge status: Home / Self Care | Payer: Medicare Other | Attending: Emergency Medicine | Admitting: Emergency Medicine

## 2016-01-01 DIAGNOSIS — J441 Chronic obstructive pulmonary disease with (acute) exacerbation: Secondary | ICD-10-CM | POA: Diagnosis not present

## 2016-01-01 DIAGNOSIS — Z79899 Other long term (current) drug therapy: Secondary | ICD-10-CM | POA: Insufficient documentation

## 2016-01-01 DIAGNOSIS — I499 Cardiac arrhythmia, unspecified: Secondary | ICD-10-CM | POA: Diagnosis not present

## 2016-01-01 DIAGNOSIS — Z7984 Long term (current) use of oral hypoglycemic drugs: Secondary | ICD-10-CM | POA: Diagnosis not present

## 2016-01-01 DIAGNOSIS — F17210 Nicotine dependence, cigarettes, uncomplicated: Secondary | ICD-10-CM | POA: Insufficient documentation

## 2016-01-01 DIAGNOSIS — E1143 Type 2 diabetes mellitus with diabetic autonomic (poly)neuropathy: Secondary | ICD-10-CM | POA: Insufficient documentation

## 2016-01-01 DIAGNOSIS — J40 Bronchitis, not specified as acute or chronic: Secondary | ICD-10-CM

## 2016-01-01 DIAGNOSIS — Z7951 Long term (current) use of inhaled steroids: Secondary | ICD-10-CM | POA: Insufficient documentation

## 2016-01-01 DIAGNOSIS — R509 Fever, unspecified: Secondary | ICD-10-CM

## 2016-01-01 DIAGNOSIS — Z7901 Long term (current) use of anticoagulants: Secondary | ICD-10-CM | POA: Insufficient documentation

## 2016-01-01 DIAGNOSIS — R454 Irritability and anger: Secondary | ICD-10-CM | POA: Diagnosis not present

## 2016-01-01 NOTE — ED Notes (Addendum)
Pt from home via EMS for fever and chills all day.Temp has been up to 103 per EMS Pt temp 99 had 650 tylenol at home around 2300 . Pt is refusing to stay here, trying to get out of bed at this time. Pt A&O . Pt denies n/v , pt diaphoretic at this time

## 2016-01-02 ENCOUNTER — Emergency Department: Payer: Medicare Other

## 2016-01-02 ENCOUNTER — Encounter: Payer: Self-pay | Admitting: Emergency Medicine

## 2016-01-02 DIAGNOSIS — R509 Fever, unspecified: Secondary | ICD-10-CM | POA: Diagnosis not present

## 2016-01-02 LAB — BLOOD GAS, VENOUS
ACID-BASE EXCESS: 1 mmol/L (ref 0.0–3.0)
BICARBONATE: 26.6 meq/L (ref 21.0–28.0)
PATIENT TEMPERATURE: 37
PCO2 VEN: 45 mmHg (ref 44.0–60.0)
PH VEN: 7.38 (ref 7.320–7.430)

## 2016-01-02 LAB — COMPREHENSIVE METABOLIC PANEL
ALBUMIN: 3.6 g/dL (ref 3.5–5.0)
ALT: 13 U/L — ABNORMAL LOW (ref 17–63)
ANION GAP: 11 (ref 5–15)
AST: 14 U/L — AB (ref 15–41)
Alkaline Phosphatase: 100 U/L (ref 38–126)
BUN: 18 mg/dL (ref 6–20)
CHLORIDE: 98 mmol/L — AB (ref 101–111)
CO2: 26 mmol/L (ref 22–32)
Calcium: 8.5 mg/dL — ABNORMAL LOW (ref 8.9–10.3)
Creatinine, Ser: 1.28 mg/dL — ABNORMAL HIGH (ref 0.61–1.24)
GFR calc Af Amer: >60 mL/min (ref >60)
GFR, EST NON AFRICAN AMERICAN: 55 mL/min — AB (ref >60)
GLUCOSE: 205 mg/dL — AB (ref 65–99)
POTASSIUM: 4.3 mmol/L (ref 3.5–5.1)
Sodium: 135 mmol/L (ref 135–145)
TOTAL PROTEIN: 7.4 g/dL (ref 6.5–8.1)
Total Bilirubin: 1 mg/dL (ref 0.3–1.2)

## 2016-01-02 LAB — URINALYSIS COMPLETE WITH MICROSCOPIC (ARMC ONLY)
BILIRUBIN URINE: NEGATIVE
GLUCOSE, UA: 50 mg/dL — AB
HGB URINE DIPSTICK: NEGATIVE
Leukocytes, UA: NEGATIVE
Nitrite: NEGATIVE
Protein, ur: 100 mg/dL — AB
Specific Gravity, Urine: 1.03 (ref 1.005–1.030)
pH: 5 (ref 5.0–8.0)

## 2016-01-02 LAB — LACTIC ACID, PLASMA: Lactic Acid, Venous: 1.4 mmol/L (ref 0.5–2.0)

## 2016-01-02 LAB — CBC
HEMATOCRIT: 45.9 % (ref 40.0–52.0)
HEMOGLOBIN: 15 g/dL (ref 13.0–18.0)
MCH: 27.9 pg (ref 26.0–34.0)
MCHC: 32.6 g/dL (ref 32.0–36.0)
MCV: 85.4 fL (ref 80.0–100.0)
Platelets: 259 10*3/uL (ref 150–440)
RBC: 5.38 MIL/uL (ref 4.40–5.90)
RDW: 14.1 % (ref 11.5–14.5)
WBC: 14.5 10*3/uL — AB (ref 3.8–10.6)

## 2016-01-02 LAB — RAPID INFLUENZA A&B ANTIGENS (ARMC ONLY)
INFLUENZA A (ARMC): NOT DETECTED
INFLUENZA B (ARMC): NOT DETECTED

## 2016-01-02 LAB — TROPONIN I: Troponin I: 0.03 ng/mL (ref <0.031)

## 2016-01-02 MED ORDER — LEVOFLOXACIN 500 MG PO TABS
500.0000 mg | ORAL_TABLET | Freq: Once | ORAL | Status: AC
  Administered 2016-01-02: 500 mg via ORAL
  Filled 2016-01-02: qty 1

## 2016-01-02 MED ORDER — AZITHROMYCIN 250 MG PO TABS: ORAL_TABLET | ORAL | Status: DC

## 2016-01-02 MED ORDER — SODIUM CHLORIDE 0.9 % IV BOLUS (SEPSIS)
1000.0000 mL | Freq: Once | INTRAVENOUS | Status: AC
  Administered 2016-01-02: 1000 mL via INTRAVENOUS

## 2016-01-02 NOTE — ED Notes (Signed)
MD at bedside. 

## 2016-01-02 NOTE — ED Notes (Addendum)
Family states pt has been weak and running fever today up to 103. Pt has had decreased appetite and cold xfew days per wife. Pt is refusing care, MD and family at bedside at this time
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
## 2016-01-02 NOTE — Discharge Instructions (Signed)
Upper Respiratory Infection, Adult Most upper respiratory infections (URIs) are a viral infection of the air passages leading to the lungs. A URI affects the nose, throat, and upper air passages. The most common type of URI is nasopharyngitis and is typically referred to as "the common cold." URIs run their course and usually go away on their own. Most of the time, a URI does not require medical attention, but sometimes a bacterial infection in the upper airways can follow a viral infection. This is called a secondary infection. Sinus and middle ear infections are common types of secondary upper respiratory infections. Bacterial pneumonia can also complicate a URI. A URI can worsen asthma and chronic obstructive pulmonary disease (COPD). Sometimes, these complications can require emergency medical care and may be life threatening.  CAUSES Almost all URIs are caused by viruses. A virus is a type of germ and can spread from one person to another.  RISKS FACTORS You may be at risk for a URI if:   You smoke.   You have chronic heart or lung disease.  You have a weakened defense (immune) system.   You are very young or very old.   You have nasal allergies or asthma.  You work in crowded or poorly ventilated areas.  You work in health care facilities or schools. SIGNS AND SYMPTOMS  Symptoms typically develop 2-3 days after you come in contact with a cold virus. Most viral URIs last 7-10 days. However, viral URIs from the influenza virus (flu virus) can last 14-18 days and are typically more severe. Symptoms may include:   Runny or stuffy (congested) nose.   Sneezing.   Cough.   Sore throat.   Headache.   Fatigue.   Fever.   Loss of appetite.   Pain in your forehead, behind your eyes, and over your cheekbones (sinus pain).  Muscle aches.  DIAGNOSIS  Your health care provider may diagnose a URI by:  Physical exam.  Tests to check that your symptoms are not due to  another condition such as:  Strep throat.  Sinusitis.  Pneumonia.  Asthma. TREATMENT  A URI goes away on its own with time. It cannot be cured with medicines, but medicines may be prescribed or recommended to relieve symptoms. Medicines may help:  Reduce your fever.  Reduce your cough.  Relieve nasal congestion. HOME CARE INSTRUCTIONS   Take medicines only as directed by your health care provider.   Gargle warm saltwater or take cough drops to comfort your throat as directed by your health care provider.  Use a warm mist humidifier or inhale steam from a shower to increase air moisture. This may make it easier to breathe.  Drink enough fluid to keep your urine clear or pale yellow.   Eat soups and other clear broths and maintain good nutrition.   Rest as needed.   Return to work when your temperature has returned to normal or as your health care provider advises. You may need to stay home longer to avoid infecting others. You can also use a face mask and careful hand washing to prevent spread of the virus.  Increase the usage of your inhaler if you have asthma.   Do not use any tobacco products, including cigarettes, chewing tobacco, or electronic cigarettes. If you need help quitting, ask your health care provider. PREVENTION  The best way to protect yourself from getting a cold is to practice good hygiene.   Avoid oral or hand contact with people with cold  symptoms.   Wash your hands often if contact occurs.  There is no clear evidence that vitamin C, vitamin E, echinacea, or exercise reduces the chance of developing a cold. However, it is always recommended to get plenty of rest, exercise, and practice good nutrition.  SEEK MEDICAL CARE IF:   You are getting worse rather than better.   Your symptoms are not controlled by medicine.   You have chills.  You have worsening shortness of breath.  You have brown or red mucus.  You have yellow or brown nasal  discharge.  You have pain in your face, especially when you bend forward.  You have a fever.  You have swollen neck glands.  You have pain while swallowing.  You have white areas in the back of your throat. SEEK IMMEDIATE MEDICAL CARE IF:   You have severe or persistent:  Headache.  Ear pain.  Sinus pain.  Chest pain.  You have chronic lung disease and any of the following:  Wheezing.  Prolonged cough.  Coughing up blood.  A change in your usual mucus.  You have a stiff neck.  You have changes in your:  Vision.  Hearing.  Thinking.  Mood. MAKE SURE YOU:   Understand these instructions.  Will watch your condition.  Will get help right away if you are not doing well or get worse.   This information is not intended to replace advice given to you by your health care provider. Make sure you discuss any questions you have with your health care provider.   Document Released: 05/20/2001 Document Revised: 04/10/2015 Document Reviewed: 03/01/2014 Elsevier Interactive Patient Education 2016 [place].  Fever, Adult A fever is an increase in the body's temperature. It is usually defined as a temperature of 100F (38C) or higher. Brief mild or moderate fevers generally have no long-term effects, and they often do not require treatment. Moderate or high fevers may make you feel uncomfortable and can sometimes be a sign of a serious illness or disease. The sweating that may occur with repeated or prolonged fever may also cause dehydration. Fever is confirmed by taking a temperature with a thermometer. A measured temperature can vary with:  Age.  Time of day.  Location of the thermometer:  Mouth (oral).  Rectum (rectal).  Ear (tympanic).  Underarm (axillary).  Forehead (temporal). HOME CARE INSTRUCTIONS Pay attention to any changes in your symptoms. Take these actions to help with your condition:  Take over-the counter and prescription medicines  only as told by your health care provider. Follow the dosing instructions carefully.  If you were prescribed an antibiotic medicine, take it as told by your health care provider. Do not stop taking the antibiotic even if you start to feel better.  Rest as needed.  Drink enough fluid to keep your urine clear or pale yellow. This helps to prevent dehydration.  Sponge yourself or bathe with room-temperature water to help reduce your body temperature as needed. Do not use ice water.  Do not overbundle yourself in blankets or heavy clothes. SEEK MEDICAL CARE IF:  You vomit.  You cannot eat or drink without vomiting.  You have diarrhea.  You have pain when you urinate.  Your symptoms do not improve with treatment.  You develop new symptoms.  You develop excessive weakness. SEEK IMMEDIATE MEDICAL CARE IF:  You have shortness of breath or have trouble breathing.  You are dizzy or you faint.  You are disoriented or confused.  You develop signs  of dehydration, such as a dry mouth, decreased urination, or paleness.  You develop severe pain in your abdomen.  You have persistent vomiting or diarrhea.  You develop a skin rash.  Your symptoms suddenly get worse.   This information is not intended to replace advice given to you by your health care provider. Make sure you discuss any questions you have with your health care provider.   Document Released: 05/20/2001 Document Revised: 08/15/2015 Document Reviewed: 01/18/2015 Elsevier Interactive Patient Education [employer].

## 2016-01-02 NOTE — ED Notes (Signed)
Pt refused C-XR. Family at bedside, pt is refusing care and wants to leave

## 2016-01-03 ENCOUNTER — Ambulatory Visit (INDEPENDENT_AMBULATORY_CARE_PROVIDER_SITE_OTHER)
Admission: RE | Admit: 2016-01-03 | Discharge: 2016-01-03 | Disposition: A | Discharge status: Home / Self Care | Payer: Medicare Other | Source: Ambulatory Visit | Attending: Family Medicine | Admitting: Family Medicine

## 2016-01-03 ENCOUNTER — Ambulatory Visit (INDEPENDENT_AMBULATORY_CARE_PROVIDER_SITE_OTHER): Payer: Medicare Other | Admitting: Family Medicine

## 2016-01-03 ENCOUNTER — Encounter: Payer: Self-pay | Admitting: Family Medicine

## 2016-01-03 VITALS — BP 100/60 | HR 52 | Temp 97.5°F | Wt 218.2 lb

## 2016-01-03 DIAGNOSIS — M25511 Pain in right shoulder: Secondary | ICD-10-CM | POA: Diagnosis not present

## 2016-01-03 DIAGNOSIS — R197 Diarrhea, unspecified: Secondary | ICD-10-CM | POA: Diagnosis not present

## 2016-01-03 DIAGNOSIS — R634 Abnormal weight loss: Secondary | ICD-10-CM

## 2016-01-03 DIAGNOSIS — J209 Acute bronchitis, unspecified: Secondary | ICD-10-CM

## 2016-01-03 DIAGNOSIS — E1165 Type 2 diabetes mellitus with hyperglycemia: Secondary | ICD-10-CM | POA: Diagnosis not present

## 2016-01-03 DIAGNOSIS — R5383 Other fatigue: Secondary | ICD-10-CM

## 2016-01-03 DIAGNOSIS — M545 Low back pain: Secondary | ICD-10-CM | POA: Diagnosis not present

## 2016-01-03 DIAGNOSIS — R531 Weakness: Secondary | ICD-10-CM

## 2016-01-03 DIAGNOSIS — M5442 Lumbago with sciatica, left side: Secondary | ICD-10-CM | POA: Diagnosis not present

## 2016-01-03 DIAGNOSIS — IMO0002 Reserved for concepts with insufficient information to code with codable children: Secondary | ICD-10-CM

## 2016-01-03 DIAGNOSIS — E1140 Type 2 diabetes mellitus with diabetic neuropathy, unspecified: Secondary | ICD-10-CM | POA: Diagnosis not present

## 2016-01-03 DIAGNOSIS — I482 Chronic atrial fibrillation, unspecified: Secondary | ICD-10-CM

## 2016-01-03 DIAGNOSIS — R5381 Other malaise: Secondary | ICD-10-CM

## 2016-01-03 LAB — RENAL FUNCTION PANEL
ALBUMIN: 3.5 g/dL (ref 3.5–5.2)
BUN: 15 mg/dL (ref 6–23)
CHLORIDE: 102 meq/L (ref 96–112)
CO2: 28 mEq/L (ref 19–32)
Calcium: 8.8 mg/dL (ref 8.4–10.5)
Creatinine, Ser: 1.17 mg/dL (ref 0.40–1.50)
GFR: 65.19 mL/min (ref >60.00)
Glucose, Bld: 162 mg/dL — ABNORMAL HIGH (ref 70–99)
POTASSIUM: 4.2 meq/L (ref 3.5–5.1)
Phosphorus: 2.8 mg/dL (ref 2.3–4.6)
SODIUM: 139 meq/L (ref 135–145)

## 2016-01-03 LAB — CBC WITH DIFFERENTIAL/PLATELET
BASOS PCT: 0.4 % (ref 0.0–3.0)
Basophils Absolute: 0 10*3/uL (ref 0.0–0.1)
EOS PCT: 1.9 % (ref 0.0–5.0)
Eosinophils Absolute: 0.2 10*3/uL (ref 0.0–0.7)
HCT: 44.7 % (ref 39.0–52.0)
HEMOGLOBIN: 14.4 g/dL (ref 13.0–17.0)
LYMPHS ABS: 2.5 10*3/uL (ref 0.7–4.0)
Lymphocytes Relative: 21.2 % (ref 12.0–46.0)
MCHC: 32.3 g/dL (ref 30.0–36.0)
MCV: 87 fl (ref 78.0–100.0)
MONO ABS: 0.9 10*3/uL (ref 0.1–1.0)
Monocytes Relative: 7.7 % (ref 3.0–12.0)
NEUTROS ABS: 8.1 10*3/uL — AB (ref 1.4–7.7)
NEUTROS PCT: 68.8 % (ref 43.0–77.0)
Platelets: 273 10*3/uL (ref 150.0–400.0)
RBC: 5.14 Mil/uL (ref 4.22–5.81)
RDW: 14.1 % (ref 11.5–15.5)
WBC: 11.8 10*3/uL — ABNORMAL HIGH (ref 4.0–10.5)

## 2016-01-03 LAB — TSH: TSH: 3.27 u[IU]/mL (ref 0.35–4.50)

## 2016-01-03 LAB — PROTIME-INR
INR: 2.1 ratio — ABNORMAL HIGH (ref 0.8–1.0)
Prothrombin Time: 22.1 s — ABNORMAL HIGH (ref 9.6–13.1)

## 2016-01-03 LAB — HEMOGLOBIN A1C: HEMOGLOBIN A1C: 9.3 % — AB (ref 4.6–6.5)

## 2016-01-03 LAB — URINE CULTURE: CULTURE: NO GROWTH

## 2016-01-03 NOTE — Assessment & Plan Note (Signed)
Worsening over last 10 days - r/o c diff today with stool test. rec decrease metformin to 500mg  BID, hold lasix, decrease potassium to 80mEq daily.

## 2016-01-03 NOTE — Assessment & Plan Note (Signed)
Cervical neck pain seems to have improved. Persistent left sided back pain with tenderness at sacroiliac joint and left sciatic notch - tightness/swelling seems present as well. Given no improvement, pt on coumadin and this occurred after fall, will order lumbar CT without contrast to further evaluate lumbar spine and soft tissues of left lower back , r/o deep tissue hematoma. Pt/wife agree.

## 2016-01-03 NOTE — Assessment & Plan Note (Addendum)
Sounds regular today. Continue amiodarone. Check TSH.

## 2016-01-03 NOTE — Patient Instructions (Addendum)
Pass by lab for labwork and stool test.  Hold lasix until feeling better. Decrease potassium to 1 pill a day. Decrease metformin to one pill twice daily.  Right shoulder xray today. Pass by Marion's office for CT scan of lower back.  We will call you with results.

## 2016-01-03 NOTE — Assessment & Plan Note (Signed)
Decrease metformin until feeling better. Update A1c today.

## 2016-01-03 NOTE — Progress Notes (Signed)
Pre visit review using our clinic review tool, if applicable. No additional management support is needed unless otherwise documented below in the visit note. 
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
## 2016-01-03 NOTE — Assessment & Plan Note (Signed)
28 lb weight loss on our scales over last 2 weeks - since diarrhea started. ?C diff - further eval with labs today (renal panel, CBC, TSH). Marland Kitchen

## 2016-01-03 NOTE — Assessment & Plan Note (Signed)
More prominent since fall. Point tender anterior shoulder - check xray to r/o dislocation, fracture.

## 2016-01-03 NOTE — Assessment & Plan Note (Signed)
Recent dx at ER - records reviewed. Completing azithromycin treatment. Lungs clear today.

## 2016-01-04 ENCOUNTER — Other Ambulatory Visit: Payer: Self-pay | Admitting: Family Medicine

## 2016-01-04 DIAGNOSIS — I714 Abdominal aortic aneurysm, without rupture, unspecified: Secondary | ICD-10-CM

## 2016-01-04 LAB — C. DIFFICILE GDH AND TOXIN A/B
C. DIFFICILE GDH: NOT DETECTED
C. difficile Toxin A/B: NOT DETECTED

## 2016-01-07 ENCOUNTER — Other Ambulatory Visit: Payer: Self-pay | Admitting: Family Medicine

## 2016-01-07 LAB — CULTURE, BLOOD (ROUTINE X 2)
CULTURE: NO GROWTH
Culture: NO GROWTH

## 2016-01-08 ENCOUNTER — Other Ambulatory Visit: Payer: Self-pay

## 2016-01-08 MED ORDER — HYDROCODONE-ACETAMINOPHEN 5-325 MG PO TABS: ORAL_TABLET | ORAL | Status: DC

## 2016-01-08 NOTE — Telephone Encounter (Signed)
Note left requesting rx hydrocodone apap. Call when ready for pick up. Pt is out of med. Last printed # 60 on 12/07/15. Last seen 01/03/16. (pt also got oxycodone apap # 50 on 12/18/15).Please advise.

## 2016-01-08 NOTE — Telephone Encounter (Signed)
Wife notified and Rx placed up front for pick up.

## 2016-01-08 NOTE — Telephone Encounter (Signed)
Printed and Kims' box.

## 2016-01-09 DIAGNOSIS — N3289 Other specified disorders of bladder: Secondary | ICD-10-CM | POA: Diagnosis not present

## 2016-01-09 DIAGNOSIS — I723 Aneurysm of iliac artery: Secondary | ICD-10-CM | POA: Diagnosis not present

## 2016-01-09 DIAGNOSIS — J432 Centrilobular emphysema: Secondary | ICD-10-CM | POA: Diagnosis not present

## 2016-01-09 DIAGNOSIS — Z95828 Presence of other vascular implants and grafts: Secondary | ICD-10-CM | POA: Diagnosis not present

## 2016-01-09 DIAGNOSIS — Z720 Tobacco use: Secondary | ICD-10-CM | POA: Diagnosis not present

## 2016-01-09 DIAGNOSIS — K4020 Bilateral inguinal hernia, without obstruction or gangrene, not specified as recurrent: Secondary | ICD-10-CM | POA: Diagnosis not present

## 2016-01-09 DIAGNOSIS — R590 Localized enlarged lymph nodes: Secondary | ICD-10-CM | POA: Diagnosis not present

## 2016-01-09 DIAGNOSIS — I716 Thoracoabdominal aortic aneurysm, without rupture: Secondary | ICD-10-CM | POA: Diagnosis not present

## 2016-01-09 DIAGNOSIS — I714 Abdominal aortic aneurysm, without rupture: Secondary | ICD-10-CM | POA: Diagnosis not present

## 2016-01-18 ENCOUNTER — Ambulatory Visit: Payer: Medicare Other | Admitting: Cardiovascular Disease

## 2016-01-18 DIAGNOSIS — E119 Type 2 diabetes mellitus without complications: Secondary | ICD-10-CM | POA: Diagnosis not present

## 2016-01-18 DIAGNOSIS — I714 Abdominal aortic aneurysm, without rupture: Secondary | ICD-10-CM | POA: Diagnosis not present

## 2016-01-18 DIAGNOSIS — Z01810 Encounter for preprocedural cardiovascular examination: Secondary | ICD-10-CM | POA: Diagnosis not present

## 2016-01-18 DIAGNOSIS — F17210 Nicotine dependence, cigarettes, uncomplicated: Secondary | ICD-10-CM | POA: Diagnosis not present

## 2016-01-18 DIAGNOSIS — I6523 Occlusion and stenosis of bilateral carotid arteries: Secondary | ICD-10-CM | POA: Diagnosis not present

## 2016-01-23 ENCOUNTER — Other Ambulatory Visit: Payer: Self-pay | Admitting: Family Medicine

## 2016-02-05 ENCOUNTER — Other Ambulatory Visit: Payer: Self-pay

## 2016-02-05 MED ORDER — HYDROCODONE-ACETAMINOPHEN 5-325 MG PO TABS: ORAL_TABLET | ORAL | Status: DC

## 2016-02-05 NOTE — Telephone Encounter (Signed)
Printed and in Kim's box 

## 2016-02-05 NOTE — Telephone Encounter (Signed)
[name] (DPR signed) left v/m requesting rx hydrocodone apap. Call when ready for pick up. rx last printed # 60 on 01/08/16. Last seen 01/03/16.

## 2016-02-06 NOTE — Telephone Encounter (Signed)
Patient's wife notified and Rx placed up front for pick up. 

## 2016-02-08 ENCOUNTER — Ambulatory Visit: Payer: Medicare Other | Admitting: Family Medicine

## 2016-02-17 ENCOUNTER — Other Ambulatory Visit: Payer: Self-pay | Admitting: Family Medicine

## 2016-02-20 ENCOUNTER — Other Ambulatory Visit: Payer: Self-pay

## 2016-02-20 ENCOUNTER — Ambulatory Visit (INDEPENDENT_AMBULATORY_CARE_PROVIDER_SITE_OTHER): Payer: Medicare Other

## 2016-02-20 ENCOUNTER — Other Ambulatory Visit: Payer: Self-pay | Admitting: Cardiovascular Disease

## 2016-02-20 DIAGNOSIS — I4891 Unspecified atrial fibrillation: Secondary | ICD-10-CM | POA: Diagnosis not present

## 2016-02-20 LAB — POCT INR: INR: 2.3

## 2016-02-20 MED ORDER — WARFARIN SODIUM 5 MG PO TABS: ORAL_TABLET | ORAL | Status: DC

## 2016-02-20 NOTE — Telephone Encounter (Signed)
[name] (DPR signed) left v/m; pt having more pain in wrist, upper arm and neck; [name] said Dr Darnell Level has no available appts and wants to know if Dr Darnell Level can prescribe pain med until pt can be seen or what does Dr Darnell Level think needs to be done. [name] request [email or web address]

## 2016-02-20 NOTE — Telephone Encounter (Signed)
Please review for refill. Thanks!  

## 2016-02-21 ENCOUNTER — Ambulatory Visit: Payer: Medicare Other | Admitting: Cardiovascular Disease

## 2016-02-22 ENCOUNTER — Other Ambulatory Visit: Payer: Self-pay | Admitting: Family Medicine

## 2016-02-22 MED ORDER — OXYCODONE-ACETAMINOPHEN 5-325 MG PO TABS: 1.0000 | ORAL_TABLET | Freq: Three times a day (TID) | ORAL | Status: DC | PRN

## 2016-02-22 NOTE — Telephone Encounter (Signed)
See below from Dr. Darnell Level. Thanks!

## 2016-02-22 NOTE — Telephone Encounter (Signed)
Px printed for pick up in IN box  

## 2016-02-22 NOTE — Telephone Encounter (Signed)
plz schedule f/u visit with me when I return, 30 min if possible. He is overdue for f/u for weight loss as well. In interim, would offer stronger pain med until I can see him. Would see if provider in office would be willing to write percocet script. don't mix with hydrocodone. If worsening pain despite percocets, rec sooner eval.

## 2016-02-22 NOTE — Telephone Encounter (Signed)
Wife notified Rx ready for pick up and advise of Dr. Synthia Innocent comments. Wife will schedule appt when she picks up Rx

## 2016-02-29 ENCOUNTER — Ambulatory Visit (INDEPENDENT_AMBULATORY_CARE_PROVIDER_SITE_OTHER): Payer: Medicare Other | Admitting: Cardiovascular Disease

## 2016-02-29 ENCOUNTER — Encounter: Payer: Self-pay | Admitting: Cardiovascular Disease

## 2016-02-29 VITALS — BP 110/64 | HR 52 | Ht 73.0 in | Wt 236.0 lb

## 2016-02-29 DIAGNOSIS — J209 Acute bronchitis, unspecified: Secondary | ICD-10-CM

## 2016-02-29 DIAGNOSIS — I482 Chronic atrial fibrillation, unspecified: Secondary | ICD-10-CM

## 2016-02-29 DIAGNOSIS — G4733 Obstructive sleep apnea (adult) (pediatric): Secondary | ICD-10-CM

## 2016-02-29 DIAGNOSIS — I714 Abdominal aortic aneurysm, without rupture, unspecified: Secondary | ICD-10-CM

## 2016-02-29 DIAGNOSIS — R197 Diarrhea, unspecified: Secondary | ICD-10-CM

## 2016-02-29 DIAGNOSIS — E785 Hyperlipidemia, unspecified: Secondary | ICD-10-CM

## 2016-02-29 DIAGNOSIS — Z720 Tobacco use: Secondary | ICD-10-CM

## 2016-02-29 MED ORDER — NITROGLYCERIN 0.4 MG/SPRAY TL SOLN: 1.0000 | Status: DC | PRN

## 2016-02-29 NOTE — Assessment & Plan Note (Signed)
More than 4 minutes spent discussing smoking cessation techniques We have encouraged him to continue to work on weaning his cigarettes and smoking cessation. He will continue to work on this and does not want any assistance with chantix.

## 2016-02-29 NOTE — Patient Instructions (Signed)
You are doing well.  Please decrease the amiodarone down to 1/2 pill daily  Please call us if you have new issues that need to be addressed before your next appt.  Your physician wants you to follow-up in: 6 months.  You will receive a reminder letter in the mail two months in advance. If you don't receive a letter, please call our office to schedule the follow-up appointment.

## 2016-02-29 NOTE — Assessment & Plan Note (Signed)
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

## 2016-02-29 NOTE — Assessment & Plan Note (Signed)
Recent episode of severe bronchitis January 2017 Reports he has made full recovery, still somewhat weak

## 2016-02-29 NOTE — Assessment & Plan Note (Signed)
He has declined cholesterol medication in the past and again today

## 2016-02-29 NOTE — Assessment & Plan Note (Signed)
Recommended he decrease his amiodarone down to 100 mg daily given his low heart rate. He feels that he is symptomatic from his bradycardia Long discussion concerning his paroxysmal atrial fibrillation. He denies any recent symptoms concerning for arrhythmia

## 2016-02-29 NOTE — Assessment & Plan Note (Signed)
He reports having diarrhea if he takes to metformin in the evening, 1 in the morning He is taking Imodium Suggested he try one metformin 3 times a day If he continues to have symptoms, recommended he talk with Dr. Danise Mina   Total encounter time more than 25 minutes  Greater than 50% was spent in counseling and coordination of care with the patient
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
## 2016-02-29 NOTE — Assessment & Plan Note (Signed)
Reports he is being evaluated at [facility], endograft has been ordered for repair, currently more than 5 cm Recommended smoking cessation

## 2016-03-03 ENCOUNTER — Other Ambulatory Visit: Payer: Self-pay | Admitting: Cardiovascular Disease

## 2016-03-03 ENCOUNTER — Other Ambulatory Visit: Payer: Self-pay

## 2016-03-03 NOTE — Telephone Encounter (Signed)
[name] (DPR signed) left v/m requesting rx hydrocodone apap. Call when ready for pick up.rx last printed # 60 on 02/05/16. Last seen 01/03/16.

## 2016-03-04 MED ORDER — HYDROCODONE-ACETAMINOPHEN 5-325 MG PO TABS: ORAL_TABLET | ORAL | Status: DC

## 2016-03-04 NOTE — Telephone Encounter (Signed)
Patient's wife notified and Rx placed up front for pick up. 

## 2016-03-04 NOTE — Telephone Encounter (Signed)
printed and in Kim's box. 

## 2016-03-07 ENCOUNTER — Ambulatory Visit (INDEPENDENT_AMBULATORY_CARE_PROVIDER_SITE_OTHER): Payer: Medicare Other | Admitting: Family Medicine

## 2016-03-07 ENCOUNTER — Encounter: Payer: Self-pay | Admitting: Family Medicine

## 2016-03-07 VITALS — BP 112/60 | HR 60 | Temp 98.1°F | Wt 236.2 lb

## 2016-03-07 DIAGNOSIS — M25511 Pain in right shoulder: Secondary | ICD-10-CM | POA: Diagnosis not present

## 2016-03-07 DIAGNOSIS — R197 Diarrhea, unspecified: Secondary | ICD-10-CM

## 2016-03-07 DIAGNOSIS — I714 Abdominal aortic aneurysm, without rupture, unspecified: Secondary | ICD-10-CM

## 2016-03-07 DIAGNOSIS — Z79891 Long term (current) use of opiate analgesic: Secondary | ICD-10-CM | POA: Diagnosis not present

## 2016-03-07 DIAGNOSIS — E1165 Type 2 diabetes mellitus with hyperglycemia: Secondary | ICD-10-CM

## 2016-03-07 DIAGNOSIS — R001 Bradycardia, unspecified: Secondary | ICD-10-CM

## 2016-03-07 DIAGNOSIS — R634 Abnormal weight loss: Secondary | ICD-10-CM

## 2016-03-07 DIAGNOSIS — G894 Chronic pain syndrome: Secondary | ICD-10-CM

## 2016-03-07 DIAGNOSIS — IMO0002 Reserved for concepts with insufficient information to code with codable children: Secondary | ICD-10-CM

## 2016-03-07 DIAGNOSIS — I482 Chronic atrial fibrillation, unspecified: Secondary | ICD-10-CM

## 2016-03-07 DIAGNOSIS — E1140 Type 2 diabetes mellitus with diabetic neuropathy, unspecified: Secondary | ICD-10-CM | POA: Diagnosis not present

## 2016-03-07 MED ORDER — SITAGLIPTIN PHOSPHATE 50 MG PO TABS: 50.0000 mg | ORAL_TABLET | Freq: Every day | ORAL | Status: DC

## 2016-03-07 MED ORDER — HYDROCODONE-ACETAMINOPHEN 10-325 MG PO TABS: 1.0000 | ORAL_TABLET | Freq: Three times a day (TID) | ORAL | Status: DC | PRN

## 2016-03-07 NOTE — Assessment & Plan Note (Signed)
Exam consistent with RTC injury and possible shoulder bursitis Provided with SM pt advisor exercises on RTC injury.  Update with effect. If no better, pt to either come in for steroid injection or call us for formal outpatient PT referral. Discussed advanced imaging vs ortho referral. Reviewed stable xray from 12/2015.

## 2016-03-07 NOTE — Assessment & Plan Note (Addendum)
Anticipate previous reading an error as he is back to 236lbs

## 2016-03-07 NOTE — Assessment & Plan Note (Addendum)
Better on lower metformin dose. Add [place] 50mg  daily. Discussed immodium use (when driving only) Recent C diff negative (12/2015)

## 2016-03-07 NOTE — Patient Instructions (Addendum)
Stop oxycodone. Increase hydrocodone to two tablets at a time as needed. New prescription will be hydrocodone 10/325mg  and printed out to fill after 03/19/2016. Continue metformin 500mg  twice daily. Let me know if persistent diarrhea. Start [place] 50mg  daily.  Update UDS today.  You have right rotator cuff injury - treat with exercises provided today with resistance band. If no better, call us for physical therapy or return for steroid injection. If no better after this, we will get MRI or send you back to orthopedist.

## 2016-03-07 NOTE — Assessment & Plan Note (Signed)
Appreciate cards care of patient. Recent INR therapeutic.

## 2016-03-07 NOTE — Progress Notes (Signed)
Pre visit review using our clinic review tool, if applicable. No additional management support is needed unless otherwise documented below in the visit note. 
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
## 2016-03-07 NOTE — Assessment & Plan Note (Signed)
Less profound on lower amiodarone dose - continue.

## 2016-03-07 NOTE — Assessment & Plan Note (Signed)
Not well controlled. Compliant with metformin 500mg  bid, concern for for diarrheal effect of medication. Overall stable on current regimen - continue. Add [place] 50mg  daily. Reassess at f/u visit in 3 months.  Wife endorses better glycemic control as evidenced by current cbg's predominantly <200

## 2016-03-07 NOTE — Assessment & Plan Note (Signed)
Following with [facility] vascular surgery, pending surgical date.
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
## 2016-03-17 ENCOUNTER — Encounter: Payer: Self-pay | Admitting: Family Medicine

## 2016-03-26 ENCOUNTER — Encounter: Payer: Self-pay | Admitting: Family Medicine

## 2016-03-31 ENCOUNTER — Other Ambulatory Visit: Payer: Self-pay | Admitting: Family Medicine

## 2016-03-31 NOTE — Telephone Encounter (Signed)
Ok to refill 

## 2016-04-02 ENCOUNTER — Ambulatory Visit (INDEPENDENT_AMBULATORY_CARE_PROVIDER_SITE_OTHER): Payer: Medicare Other

## 2016-04-02 DIAGNOSIS — I4891 Unspecified atrial fibrillation: Secondary | ICD-10-CM

## 2016-04-02 LAB — POCT INR: INR: 2.2

## 2016-04-07 ENCOUNTER — Other Ambulatory Visit: Payer: Self-pay | Admitting: Family Medicine

## 2016-04-09 ENCOUNTER — Other Ambulatory Visit: Payer: Self-pay | Admitting: Cardiovascular Disease

## 2016-04-10 ENCOUNTER — Encounter: Payer: Self-pay | Admitting: Family Medicine

## 2016-04-10 ENCOUNTER — Ambulatory Visit (INDEPENDENT_AMBULATORY_CARE_PROVIDER_SITE_OTHER): Payer: Medicare Other | Admitting: Family Medicine

## 2016-04-10 VITALS — BP 120/70 | HR 47 | Temp 97.3°F | Wt 241.0 lb

## 2016-04-10 DIAGNOSIS — R197 Diarrhea, unspecified: Secondary | ICD-10-CM | POA: Diagnosis not present

## 2016-04-10 LAB — COMPREHENSIVE METABOLIC PANEL
ALK PHOS: 79 U/L (ref 39–117)
ALT: 11 U/L (ref 0–53)
AST: 13 U/L (ref 0–37)
Albumin: 3.8 g/dL (ref 3.5–5.2)
BILIRUBIN TOTAL: 0.5 mg/dL (ref 0.2–1.2)
BUN: 11 mg/dL (ref 6–23)
CALCIUM: 9 mg/dL (ref 8.4–10.5)
CO2: 28 meq/L (ref 19–32)
Chloride: 100 mEq/L (ref 96–112)
Creatinine, Ser: 1.04 mg/dL (ref 0.40–1.50)
GFR: 74.63 mL/min (ref >60.00)
Glucose, Bld: 232 mg/dL — ABNORMAL HIGH (ref 70–99)
Potassium: 3.6 mEq/L (ref 3.5–5.1)
Sodium: 136 mEq/L (ref 135–145)
Total Protein: 6.6 g/dL (ref 6.0–8.3)

## 2016-04-10 LAB — CBC WITH DIFFERENTIAL/PLATELET
BASOS ABS: 0 10*3/uL (ref 0.0–0.1)
Basophils Relative: 0.4 % (ref 0.0–3.0)
Eosinophils Absolute: 0.1 10*3/uL (ref 0.0–0.7)
Eosinophils Relative: 0.8 % (ref 0.0–5.0)
HEMATOCRIT: 41.5 % (ref 39.0–52.0)
HEMOGLOBIN: 13.4 g/dL (ref 13.0–17.0)
LYMPHS PCT: 29.1 % (ref 12.0–46.0)
Lymphs Abs: 2.7 10*3/uL (ref 0.7–4.0)
MCHC: 32.4 g/dL (ref 30.0–36.0)
MCV: 84.7 fl (ref 78.0–100.0)
MONOS PCT: 6.7 % (ref 3.0–12.0)
Monocytes Absolute: 0.6 10*3/uL (ref 0.1–1.0)
NEUTROS ABS: 5.8 10*3/uL (ref 1.4–7.7)
Neutrophils Relative %: 63 % (ref 43.0–77.0)
Platelets: 288 10*3/uL (ref 150.0–400.0)
RBC: 4.89 Mil/uL (ref 4.22–5.81)
RDW: 15.5 % (ref 11.5–15.5)
WBC: 9.2 10*3/uL (ref 4.0–10.5)

## 2016-04-10 MED ORDER — AZITHROMYCIN 500 MG PO TABS: 1000.0000 mg | ORAL_TABLET | Freq: Once | ORAL | Status: DC

## 2016-04-10 NOTE — Progress Notes (Signed)
Pre visit review using our clinic review tool, if applicable. No additional management support is needed unless otherwise documented below in the visit note. 

## 2016-04-10 NOTE — Progress Notes (Signed)
BP 120/70 mmHg  Pulse 47  Temp(Src) 97.3 F (36.3 C) (Oral)  Wt 241 lb (109.317 kg)  SpO2 97%   CC: diarrhea  Subjective:    Patient ID: [name], male    DOB: 12/13/1945, 70 y.o.   MRN: [id number]  HPI: [name] is a 70 y.o. male presenting on 04/10/2016 for Diarrhea   10d h/o diarrhea with any meal. Trying to stay well hydrated. This started after eating steak at [facility] in [place] - ate with son. Son also sick with diarrhea but now better. Initially multiple episodes of diarrhea throughout the day for first week, now diarrhea just after meals. Collected stool in office today.   No fevers/chills, abd pain, nausea/vomiting, blood in stool. Weight gain noted over last few weeks. No malaise - feeling well overall.   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   Using OTC imodium and pepto bismol which isn't helping. On metformin 500mg  bid. Januvia has been held.   Lab Results  Component Value Date   HGBA1C 9.3* 01/03/2016  fasting cbg yesterday 174.   Pt has previously denied colonoscopy (referred to GI 2014). He had barium enema.   Relevant past medical, surgical, family and social history reviewed and updated as indicated. Interim medical history since our last visit reviewed. Allergies and medications reviewed and updated. Current Outpatient Prescriptions on File Prior to Visit  Medication Sig  . ADVAIR DISKUS 500-50 MCG/DOSE AEPB INHALE 1 PUFF BY MOUTH TWICE DAILY  . amiodarone (PACERONE) 200 MG tablet TAKE 1 TABLET BY MOUTH EVERY DAY (Patient taking differently: TAKE ONE-HALF TABLET BY MOUTH EVERY DAY)  . Coenzyme Q10 100 MG capsule Take 1 capsule (100 mg total) by mouth daily.  . fish oil-omega-3 fatty acids 1000 MG capsule Take 2 g by mouth daily.   . furosemide (LASIX) 20 MG tablet TAKE 1 TABLET BY MOUTH AS NEEDED FOR FLUID  . gabapentin (NEURONTIN) 600 MG tablet TAKE 1 TABLET BY MOUTH THREE TIMES DAILY  . glucose blood (ONE TOUCH ULTRA  TEST) test strip Check blood sugar once daily and as needed. 250.60  . Glucose Blood (ONETOUCH ULTRA BLUE VI) by In Vitro route. Test blood glucose 1 time per day and as directed.   Marland Kitchen HYDROcodone-acetaminophen (NORCO) 10-325 MG tablet Take 1 tablet by mouth every 8 (eight) hours as needed.  Marland Kitchen ipratropium-albuterol (DUONEB) 0.5-2.5 (3) MG/3ML SOLN Take 3 mLs by nebulization 2 (two) times daily as needed. For shortness of breath.  . metFORMIN (GLUCOPHAGE) 500 MG tablet Take 1 tablet (500 mg total) by mouth 2 (two) times daily with a meal.  . nitroGLYCERIN (NITROLINGUAL) 0.4 MG/SPRAY spray Place 1 spray under the tongue every 5 (five) minutes as needed.  Marland Kitchen omeprazole (PRILOSEC) 40 MG capsule TAKE 1 CAPSULE BY MOUTH EVERY DAY  . potassium chloride (K-DUR,KLOR-CON) 10 MEQ tablet Take 10 mEq by mouth daily. If lasix taken  . PROAIR HFA 108 (90 Base) MCG/ACT inhaler INHALE 2 PUFFS BY MOUTH EVERY 6 HOURS AS NEEDED FOR WHEEZING OR SHORTNESS OF BREATH  . sitaGLIPtin (JANUVIA) 50 MG tablet Take 1 tablet (50 mg total) by mouth daily.  Marland Kitchen tiZANidine (ZANAFLEX) 4 MG tablet TAKE 1 TABLET BY MOUTH TWICE DAILY AS NEEDED FOR MUSCLE SPASMS  . warfarin (COUMADIN) 5 MG tablet TAKE AS DIRECTED BY COUMADIN CLINIC   No current facility-administered medications on file prior to visit.    Review of Systems Per HPI unless specifically indicated in  ROS section     Objective:    BP 120/70 mmHg  Pulse 47  Temp(Src) 97.3 F (36.3 C) (Oral)  Wt 241 lb (109.317 kg)  SpO2 97%  Wt Readings from Last 3 Encounters:  04/10/16 241 lb (109.317 kg)  03/07/16 236 lb 4 oz (107.162 kg)  02/29/16 236 lb (107.049 kg)    Physical Exam  Constitutional: He appears well-developed and well-nourished. No distress.  HENT:  Mouth/Throat: Oropharynx is clear and moist. No oropharyngeal exudate.  Cardiovascular: Normal rate, regular rhythm, normal heart sounds and intact distal pulses.   No murmur heard. Pulmonary/Chest: Effort  normal and breath sounds normal. No respiratory distress. He has no wheezes. He has no rales.  Abdominal: Soft. Normal appearance and bowel sounds are normal. He exhibits no distension and no mass. There is no tenderness. There is no rigidity, no rebound, no guarding, no CVA tenderness and negative Murphy's sign.  Musculoskeletal: He exhibits no edema.  Skin: Skin is warm and dry. No rash noted.  Psychiatric: He has a normal mood and affect.  Nursing note and vitals reviewed.  Results for orders placed or performed in visit on 04/02/16  POCT INR  Result Value Ref Range   INR 2.2       Assessment & Plan:   Problem List Items Addressed This Visit    Diarrhea - Primary    Concern for infectious diarrhea after steak at [facility] - son sick as well.  Check labs and stool culture/C diff today. Treat with azithromycin 1000mg  once.  Advised contact coumadin clinic for any needed change in dosing. Will route note to [place].  Update if persistent diarrhea.       Relevant Orders   Comprehensive metabolic panel   CBC with Differential/Platelet   Stool culture   C. difficile GDH and Toxin A/B       Follow up plan: Return if symptoms worsen or fail to improve.  Ria Bush, MD

## 2016-04-10 NOTE — Patient Instructions (Addendum)
Labs and stool study today. I think you have bacterial infection - treat with azithromycin 1000mg  once. Call coumadin clinic for dosing of coumadin. If no better, call me for further stool testing.  Push fluids and rest.   Colitis Colitis is inflammation of the colon. Colitis may last a short time (acute) or it may last a long time (chronic). CAUSES This condition may be caused by:  Viruses.  Bacteria.  Reactions to medicine.  Certain autoimmune diseases, such as Crohn disease or ulcerative colitis. SYMPTOMS Symptoms of this condition include:  Diarrhea.  Passing bloody or tarry stool.  Pain.  Fever.  Vomiting.  Tiredness (fatigue).  Weight loss.  Bloating.  Sudden increase in abdominal pain.  Having fewer bowel movements than usual. DIAGNOSIS This condition is diagnosed with a stool test or a blood test. You may also have other tests, including X-rays, a CT scan, or a colonoscopy. TREATMENT Treatment may include:  Resting the bowel. This involves not eating or drinking for a period of time.  Fluids that are given through an IV tube.  Medicine for pain and diarrhea.  Antibiotic medicines.  Cortisone medicines.  Surgery. HOME CARE INSTRUCTIONS Eating and Drinking  Follow instructions from your health care provider about eating or drinking restrictions.  Drink enough fluid to keep your urine clear or pale yellow.  Work with a dietitian to determine which foods cause your condition to flare up.  Avoid foods that cause flare-ups.  Eat a well-balanced diet. Medicines  Take over-the-counter and prescription medicines only as told by your health care provider.  If you were prescribed an antibiotic medicine, take it as told by your health care provider. Do not stop taking the antibiotic even if you start to feel better. General Instructions  Keep all follow-up visits as told by your health care provider. This is important. SEEK MEDICAL CARE  IF:  Your symptoms do not go away.  You develop new symptoms. SEEK IMMEDIATE MEDICAL CARE IF:  You have a fever that does not go away with treatment.  You develop chills.  You have extreme weakness, fainting, or dehydration.  You have repeated vomiting.  You develop severe pain in your abdomen.  You pass bloody or tarry stool.   This information is not intended to replace advice given to you by your health care provider. Make sure you discuss any questions you have with your health care provider.   Document Released: 01/01/2005 Document Revised: 08/15/2015 Document Reviewed: 03/19/2015 Elsevier Interactive Patient Education [employer].

## 2016-04-10 NOTE — Assessment & Plan Note (Addendum)
Concern for infectious diarrhea after steak at [facility] - son sick as well.  Check labs and stool culture/C diff today. Treat with azithromycin 1000mg  once.  Advised contact coumadin clinic for any needed change in dosing. Will route note to [place].  Update if persistent diarrhea.

## 2016-04-11 ENCOUNTER — Ambulatory Visit: Payer: Medicare Other | Admitting: Family Medicine

## 2016-04-11 LAB — C. DIFFICILE GDH AND TOXIN A/B
C. difficile GDH: NOT DETECTED
C. difficile Toxin A/B: NOT DETECTED

## 2016-04-14 LAB — STOOL CULTURE

## 2016-04-16 ENCOUNTER — Telehealth: Payer: Self-pay

## 2016-04-16 NOTE — Telephone Encounter (Signed)
Called to follow-up with pt, spoke with pt's wife, she states GI issues are resolved after taking 1000mg  of Azithromax rx by Dr Danise Mina on 04/10/16.  Advised Azithromycin can slightly increase INR but since only took 1000mg  x 1 dosage effects should only be minimal.  Advised more problematic than abx rx is persistent diarrhea which prevents absorption of food and nutrients in the GI tract and can result in elevated INR's.  Pt's wife states diarrhea and GI upset has resolved at present advised to notify clinic if pt ever has diarrhea or N&V that lasts 3 or more days, she verbalized understanding. C-Diff and stool cultures negative per lab results, no further abx's rx.  Advised pt's wife to continue on same dosage of Coumadin and keep scheduled f/u in clinic.  Call back if any new medications started or GI issues arise again.

## 2016-04-18 ENCOUNTER — Telehealth: Payer: Self-pay

## 2016-04-18 DIAGNOSIS — R197 Diarrhea, unspecified: Secondary | ICD-10-CM

## 2016-04-18 MED ORDER — DIPHENOXYLATE-ATROPINE 2.5-0.025 MG PO TABS: 1.0000 | ORAL_TABLET | Freq: Two times a day (BID) | ORAL | Status: DC | PRN

## 2016-04-18 NOTE — Telephone Encounter (Signed)
Recommend come in for further stool testing. Ordered. plz phone in antidiarrheal medication as well as long as no fever or abd pain or blood in stool. Don't take together with immodium.

## 2016-04-18 NOTE — Telephone Encounter (Signed)
[name] left v/m;pt is presently out of town but pt called [name] and diarrhea has started again. [name] request another abx or med to stop diarrhea. [facility] [address] Pt was seen 04/10/16 with diarrhea. [name] request cb. 04/10/16 note said may need further stool testing.Please advise.

## 2016-04-21 ENCOUNTER — Other Ambulatory Visit: Payer: Self-pay | Admitting: *Deleted

## 2016-04-21 NOTE — Telephone Encounter (Signed)
Would also suggest start probiotic like culturelle or phillips colon health.

## 2016-04-21 NOTE — Telephone Encounter (Signed)
Ok to refill? Would like to p/u tomorrow.

## 2016-04-21 NOTE — Telephone Encounter (Signed)
[facility] notified. They will come by tomorrow to p/u stool kit. Rx called in as directed.

## 2016-04-22 MED ORDER — HYDROCODONE-ACETAMINOPHEN 10-325 MG PO TABS: 1.0000 | ORAL_TABLET | Freq: Three times a day (TID) | ORAL | Status: DC | PRN

## 2016-04-22 NOTE — Telephone Encounter (Signed)
Printed and in Kim's box 

## 2016-04-23 NOTE — Telephone Encounter (Signed)
Message left advising patient's wife and Rx placed up front for pick up. 

## 2016-04-24 ENCOUNTER — Other Ambulatory Visit: Payer: Self-pay | Admitting: Family Medicine

## 2016-05-14 ENCOUNTER — Ambulatory Visit (INDEPENDENT_AMBULATORY_CARE_PROVIDER_SITE_OTHER): Payer: Medicare Other

## 2016-05-14 DIAGNOSIS — I4891 Unspecified atrial fibrillation: Secondary | ICD-10-CM

## 2016-05-14 LAB — POCT INR: INR: 2.5

## 2016-05-19 ENCOUNTER — Other Ambulatory Visit: Payer: Self-pay | Admitting: Cardiovascular Disease

## 2016-05-19 NOTE — Telephone Encounter (Signed)
Please review for refill, Thank you. 

## 2016-05-29 ENCOUNTER — Other Ambulatory Visit: Payer: Self-pay

## 2016-05-29 MED ORDER — HYDROCODONE-ACETAMINOPHEN 10-325 MG PO TABS: 1.0000 | ORAL_TABLET | Freq: Three times a day (TID) | ORAL | Status: DC | PRN

## 2016-05-29 NOTE — Telephone Encounter (Addendum)
Left message for [name] that [name]'s prescription is ready to be picked up at the front desk.

## 2016-05-29 NOTE — Telephone Encounter (Signed)
Ms [name] [facility] signed) left v/m requesting rx hydrocodone apap. Call when ready for pick up. Last printed # 60 on 04/22/16 and last seen 03/07/16.

## 2016-06-30 ENCOUNTER — Ambulatory Visit (INDEPENDENT_AMBULATORY_CARE_PROVIDER_SITE_OTHER): Payer: Medicare Other | Admitting: Family Medicine

## 2016-06-30 ENCOUNTER — Encounter: Payer: Self-pay | Admitting: Family Medicine

## 2016-06-30 VITALS — BP 128/72 | HR 54 | Temp 97.7°F | Wt 236.2 lb

## 2016-06-30 DIAGNOSIS — J209 Acute bronchitis, unspecified: Secondary | ICD-10-CM

## 2016-06-30 DIAGNOSIS — J449 Chronic obstructive pulmonary disease, unspecified: Secondary | ICD-10-CM | POA: Diagnosis not present

## 2016-06-30 DIAGNOSIS — Z720 Tobacco use: Secondary | ICD-10-CM

## 2016-06-30 DIAGNOSIS — IMO0002 Reserved for concepts with insufficient information to code with codable children: Secondary | ICD-10-CM

## 2016-06-30 DIAGNOSIS — I714 Abdominal aortic aneurysm, without rupture, unspecified: Secondary | ICD-10-CM

## 2016-06-30 DIAGNOSIS — E1165 Type 2 diabetes mellitus with hyperglycemia: Secondary | ICD-10-CM

## 2016-06-30 DIAGNOSIS — E1140 Type 2 diabetes mellitus with diabetic neuropathy, unspecified: Secondary | ICD-10-CM | POA: Diagnosis not present

## 2016-06-30 DIAGNOSIS — Z7901 Long term (current) use of anticoagulants: Secondary | ICD-10-CM

## 2016-06-30 MED ORDER — FLUTICASONE FUROATE-VILANTEROL 200-25 MCG/INH IN AEPB: 1.0000 | INHALATION_SPRAY | Freq: Every day | RESPIRATORY_TRACT | 3 refills | Status: DC

## 2016-06-30 MED ORDER — HYDROCODONE-ACETAMINOPHEN 10-325 MG PO TABS: 1.0000 | ORAL_TABLET | Freq: Three times a day (TID) | ORAL | 0 refills | Status: DC | PRN

## 2016-06-30 MED ORDER — DOXYCYCLINE HYCLATE 100 MG PO TABS: 100.0000 mg | ORAL_TABLET | Freq: Two times a day (BID) | ORAL | 0 refills | Status: DC

## 2016-06-30 NOTE — Assessment & Plan Note (Signed)
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

## 2016-06-30 NOTE — Progress Notes (Signed)
BP 128/72   Pulse (!) 54   Temp 97.7 F (36.5 C) (Oral)   Wt 236 lb 4 oz (107.2 kg)   SpO2 96%   BMI 31.17 kg/m    CC: 1 wk h/o cough  Subjective:    Patient ID: [name], male    DOB: [birth year], 70 y.o.   MRN: [id number]  HPI: [name] is a 69 y.o. male presenting on 06/30/2016 for Cough (x1 week; productive)   Pt with COPD continued smoker 1ppd, CHF, diabetes, OSA and parox afib on coumadin. 1 wk h/o productive cough, worse cough at night time. Coughing fits with dyspnea, doesn't know if he's wheezing.   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 Humid heat makes breathing worse.  Using proair 10 times daily. No advair or duoneb over last 1+ year.   Denies fevers/chills, headache, ST   Upcoming vascular surgery with Dr Vinnie Level in August.   Relevant past medical, surgical, family and social history reviewed and updated as indicated. Interim medical history since our last visit reviewed. Allergies and medications reviewed and updated. Current Outpatient Prescriptions on File Prior to Visit  Medication Sig  . Coenzyme Q10 100 MG capsule Take 1 capsule (100 mg total) by mouth daily.  . diphenoxylate-atropine (LOMOTIL) 2.5-0.025 MG tablet Take 1 tablet by mouth 2 (two) times daily as needed for diarrhea or loose stools.  . fish oil-omega-3 fatty acids 1000 MG capsule Take 2 g by mouth daily.   . furosemide (LASIX) 20 MG tablet TAKE 1 TABLET BY MOUTH AS NEEDED FOR FLUID  . gabapentin (NEURONTIN) 600 MG tablet TAKE 1 TABLET BY MOUTH THREE TIMES DAILY  . glucose blood (ONE TOUCH ULTRA TEST) test strip Check blood sugar once daily and as needed. 250.60  . Glucose Blood (ONETOUCH ULTRA BLUE VI) by In Vitro route. Test blood glucose 1 time per day and as directed.   . metFORMIN (GLUCOPHAGE) 500 MG tablet Take 1 tablet (500 mg total) by mouth 2 (two) times daily with a meal.  . nitroGLYCERIN (NITROLINGUAL) 0.4 MG/SPRAY spray Place 1 spray under the tongue every 5 (five) minutes as  needed.  Marland Kitchen omeprazole (PRILOSEC) 40 MG capsule TAKE 1 CAPSULE BY MOUTH EVERY DAY  . potassium chloride (K-DUR,KLOR-CON) 10 MEQ tablet Take 10 mEq by mouth daily. If lasix taken  . PROAIR HFA 108 (90 Base) MCG/ACT inhaler INHALE 2 PUFFS BY MOUTH EVERY 6 HOURS AS NEEDED FOR WHEEZING OR SHORTNESS OF BREATH  . sitaGLIPtin (JANUVIA) 50 MG tablet Take 1 tablet (50 mg total) by mouth daily.  Marland Kitchen tiZANidine (ZANAFLEX) 4 MG tablet TAKE 1 TABLET BY MOUTH TWICE DAILY AS NEEDED FOR MUSCLE SPASMS  . warfarin (COUMADIN) 5 MG tablet TAKE AS DIRECTED BY COUMADIN CLINIC   No current facility-administered medications on file prior to visit.     Review of Systems Per HPI unless specifically indicated in ROS section     Objective:    BP 128/72   Pulse (!) 54   Temp 97.7 F (36.5 C) (Oral)   Wt 236 lb 4 oz (107.2 kg)   SpO2 96%   BMI 31.17 kg/m   Wt Readings from Last 3 Encounters:  06/30/16 236 lb 4 oz (107.2 kg)  04/10/16 241 lb (109.3 kg)  03/07/16 236 lb 4 oz (107.2 kg)    Physical Exam  Constitutional: He appears well-developed and well-nourished. No distress.  HENT:  Head: Normocephalic and atraumatic.  Right Ear: Hearing, tympanic membrane, external ear  and ear canal normal.  Left Ear: Hearing, tympanic membrane, external ear and ear canal normal.  Nose: Nose normal. No mucosal edema or rhinorrhea. Right sinus exhibits no maxillary sinus tenderness and no frontal sinus tenderness. Left sinus exhibits no maxillary sinus tenderness and no frontal sinus tenderness.  Mouth/Throat: Uvula is midline, oropharynx is clear and moist and mucous membranes are normal. No oropharyngeal exudate, posterior oropharyngeal edema, posterior oropharyngeal erythema or tonsillar abscesses.  Eyes: Conjunctivae and EOM are normal. Pupils are equal, round, and reactive to light. No scleral icterus.  Neck: Normal range of motion. Neck supple.  Cardiovascular: Normal rate, regular rhythm, normal heart sounds and intact  distal pulses.   No murmur heard. Pulmonary/Chest: Effort normal. No respiratory distress. He has no wheezes. He has rhonchi (diffuse coarse). He has no rales.  Lymphadenopathy:    He has no cervical adenopathy.  Skin: Skin is warm and dry. No rash noted.  Nursing note and vitals reviewed.  Results for orders placed or performed in visit on 05/14/16  POCT INR  Result Value Ref Range   INR 2.5   Next coumadin clinic appt scheduled 7/26. Lab Results  Component Value Date   HGBA1C 9.3 (H) 01/03/2016       Assessment & Plan:   Problem List Items Addressed This Visit    AAA (abdominal aortic aneurysm) without rupture [facility])    S/p endovascular repair 11/2011, discussing re-stenting by [facility] Sammuel Hines).      Relevant Medications   amiodarone (PACERONE) 100 MG tablet   Acute bronchitis - Primary    1 wk of bronchitis in setting of COPD and continued smoker. Treat aggressively with doxycycline 10d course. No need for steroids today. Pt declines cough syrup.      COPD mixed type ([place])    Continue to encourage smoking cessation.  Has not been taking advair or duoneb but rather albuterol proair up to 10x/day. Discussed difference between rescue inhaler and controller inhaler, recommend start ICS/LABA regularly, sent in breo to price out. If unaffordable, rec start brother's left over symbicort at home.       Relevant Medications   fluticasone furoate-vilanterol (BREO ELLIPTA) 200-25 MCG/INH AEPB   budesonide-formoterol (SYMBICORT) 160-4.5 MCG/ACT inhaler   Current use of long term anticoagulation    Discussed doxy effect on coumadin levels.  Will route today's note to coumadin clinic as fyi as he's seeing them in 2 days. No changes to coumadin dosing made today.      Tobacco abuse    Continues smoking 1 ppd. Encouraged continued attempting to quit smoking especially with upcoming vascular surgery      Type 2 diabetes, uncontrolled, with neuropathy (HCC)    Chronic, uncontrolled.  Continue metformin januvia and gabapentin. Return in 2-3 wks for DM check. Discussed importance of better glycemic control in setting of upcoming vascular surgery.       Other Visit Diagnoses   None.      Follow up plan: No Follow-up on file.  Ria Bush, MD

## 2016-06-30 NOTE — Assessment & Plan Note (Signed)
Continues smoking 1 ppd. Encouraged continued attempting to quit smoking especially with upcoming vascular surgery

## 2016-06-30 NOTE — Assessment & Plan Note (Signed)
Continue to encourage smoking cessation.  Has not been taking advair or duoneb but rather albuterol proair up to 10x/day. Discussed difference between rescue inhaler and controller inhaler, recommend start ICS/LABA regularly, sent in breo to price out. If unaffordable, rec start brother's left over symbicort at home.

## 2016-06-30 NOTE — Assessment & Plan Note (Signed)
Chronic, uncontrolled. Continue metformin januvia and gabapentin. Return in 2-3 wks for DM check. Discussed importance of better glycemic control in setting of upcoming vascular surgery.

## 2016-06-30 NOTE — Assessment & Plan Note (Signed)
Discussed doxy effect on coumadin levels.  Will route today's note to coumadin clinic as fyi as he's seeing them in 2 days. No changes to coumadin dosing made today.

## 2016-06-30 NOTE — Assessment & Plan Note (Signed)
1 wk of bronchitis in setting of COPD and continued smoker. Treat aggressively with doxycycline 10d course. No need for steroids today. Pt declines cough syrup.

## 2016-06-30 NOTE — Patient Instructions (Addendum)
Over the next 2 weeks return on a Monday or Friday for diabetes check.  I think you have bronchitis with COPD flare. Treat with doxycycline antibiotic.  Take symbicort 1 puff twice daily.  May continue albuterol as needed.

## 2016-07-02 ENCOUNTER — Ambulatory Visit (INDEPENDENT_AMBULATORY_CARE_PROVIDER_SITE_OTHER): Payer: Medicare Other

## 2016-07-02 DIAGNOSIS — I4891 Unspecified atrial fibrillation: Secondary | ICD-10-CM

## 2016-07-02 LAB — POCT INR: INR: 2

## 2016-07-10 ENCOUNTER — Telehealth: Payer: Self-pay

## 2016-07-10 NOTE — Telephone Encounter (Signed)
Pt has been cleared to hold Coumadin prior to endograft surgery at [facility] on 08/06/16.  Pt has f/u appt in Coumadin clinic on 07/30/16 will coordinate stop date with pt at that time.

## 2016-07-10 NOTE — Telephone Encounter (Signed)
-----   Message from Minna Merritts, MD sent at 07/09/2016  9:59 PM EDT ----- Acceptable risk for surgery Ok to hold warfarin prior to endograft procedure thx TG    ----- Message ----- From: Brynda Peon, RN Sent: 07/02/2016   1:25 PM To: Minna Merritts, MD  Pt states he is scheduled for endograft surgery at [facility] on 08/06/16.  Please advise if cleared for surgery and ok to hold Coumadin prior to surgery.  Thanks

## 2016-07-14 ENCOUNTER — Emergency Department
Admission: EM | Admit: 2016-07-14 | Discharge: 2016-07-15 | Payer: Medicare Other | Attending: Student in an Organized Health Care Education/Training Program | Admitting: Student in an Organized Health Care Education/Training Program

## 2016-07-14 ENCOUNTER — Encounter: Payer: Self-pay | Admitting: Emergency Medicine

## 2016-07-14 ENCOUNTER — Emergency Department: Payer: Medicare Other

## 2016-07-14 ENCOUNTER — Ambulatory Visit: Payer: Medicare Other | Admitting: Family Medicine

## 2016-07-14 ENCOUNTER — Ambulatory Visit: Payer: Self-pay | Admitting: Family Medicine

## 2016-07-14 ENCOUNTER — Telehealth: Payer: Self-pay | Admitting: Family Medicine

## 2016-07-14 DIAGNOSIS — R1013 Epigastric pain: Secondary | ICD-10-CM | POA: Diagnosis not present

## 2016-07-14 DIAGNOSIS — J449 Chronic obstructive pulmonary disease, unspecified: Secondary | ICD-10-CM | POA: Insufficient documentation

## 2016-07-14 DIAGNOSIS — E119 Type 2 diabetes mellitus without complications: Secondary | ICD-10-CM | POA: Insufficient documentation

## 2016-07-14 DIAGNOSIS — F17210 Nicotine dependence, cigarettes, uncomplicated: Secondary | ICD-10-CM | POA: Diagnosis not present

## 2016-07-14 DIAGNOSIS — I509 Heart failure, unspecified: Secondary | ICD-10-CM | POA: Insufficient documentation

## 2016-07-14 DIAGNOSIS — I714 Abdominal aortic aneurysm, without rupture, unspecified: Secondary | ICD-10-CM

## 2016-07-14 DIAGNOSIS — Z79899 Other long term (current) drug therapy: Secondary | ICD-10-CM | POA: Insufficient documentation

## 2016-07-14 DIAGNOSIS — Z7901 Long term (current) use of anticoagulants: Secondary | ICD-10-CM | POA: Diagnosis not present

## 2016-07-14 DIAGNOSIS — Z7984 Long term (current) use of oral hypoglycemic drugs: Secondary | ICD-10-CM | POA: Diagnosis not present

## 2016-07-14 DIAGNOSIS — R935 Abnormal findings on diagnostic imaging of other abdominal regions, including retroperitoneum: Secondary | ICD-10-CM

## 2016-07-14 DIAGNOSIS — R938 Abnormal findings on diagnostic imaging of other specified body structures: Secondary | ICD-10-CM | POA: Diagnosis not present

## 2016-07-14 DIAGNOSIS — R112 Nausea with vomiting, unspecified: Secondary | ICD-10-CM

## 2016-07-14 LAB — URINALYSIS COMPLETE WITH MICROSCOPIC (ARMC ONLY)
BACTERIA UA: NONE SEEN
Bilirubin Urine: NEGATIVE
GLUCOSE, UA: NEGATIVE mg/dL
Nitrite: NEGATIVE
PH: 6 (ref 5.0–8.0)
PROTEIN: NEGATIVE mg/dL
SQUAMOUS EPITHELIAL / LPF: NONE SEEN
Specific Gravity, Urine: 1.032 — ABNORMAL HIGH (ref 1.005–1.030)

## 2016-07-14 LAB — CBC
HCT: 43.3 % (ref 40.0–52.0)
HEMATOCRIT: 43.3 % (ref 40.0–52.0)
HEMOGLOBIN: 14.6 g/dL (ref 13.0–18.0)
HEMOGLOBIN: 14.9 g/dL (ref 13.0–18.0)
MCH: 28 pg (ref 26.0–34.0)
MCH: 28.5 pg (ref 26.0–34.0)
MCHC: 33.7 g/dL (ref 32.0–36.0)
MCHC: 34.3 g/dL (ref 32.0–36.0)
MCV: 83 fL (ref 80.0–100.0)
MCV: 83 fL (ref 80.0–100.0)
PLATELETS: 270 10*3/uL (ref 150–440)
Platelets: 266 10*3/uL (ref 150–440)
RBC: 5.22 MIL/uL (ref 4.40–5.90)
RBC: 5.22 MIL/uL (ref 4.40–5.90)
RDW: 15.2 % — AB (ref 11.5–14.5)
RDW: 15.1 % — ABNORMAL HIGH (ref 11.5–14.5)
WBC: 16.1 10*3/uL — ABNORMAL HIGH (ref 3.8–10.6)
WBC: 15.7 10*3/uL — ABNORMAL HIGH (ref 3.8–10.6)

## 2016-07-14 LAB — COMPREHENSIVE METABOLIC PANEL
ALBUMIN: 3.4 g/dL — AB (ref 3.5–5.0)
ALK PHOS: 79 U/L (ref 38–126)
ALT: 15 U/L — AB (ref 17–63)
AST: 17 U/L (ref 15–41)
Anion gap: 8 (ref 5–15)
BUN: 9 mg/dL (ref 6–20)
CALCIUM: 8.9 mg/dL (ref 8.9–10.3)
CHLORIDE: 97 mmol/L — AB (ref 101–111)
CO2: 28 mmol/L (ref 22–32)
CREATININE: 0.77 mg/dL (ref 0.61–1.24)
GFR calc Af Amer: >60 mL/min (ref >60)
GFR calc non Af Amer: >60 mL/min (ref >60)
GLUCOSE: 151 mg/dL — AB (ref 65–99)
Potassium: 4.5 mmol/L (ref 3.5–5.1)
SODIUM: 133 mmol/L — AB (ref 135–145)
Total Bilirubin: 1.1 mg/dL (ref 0.3–1.2)
Total Protein: 7.2 g/dL (ref 6.5–8.1)

## 2016-07-14 LAB — APTT: APTT: 69 s — AB (ref 24–36)

## 2016-07-14 LAB — LIPASE, BLOOD: Lipase: 17 U/L (ref 11–51)

## 2016-07-14 LAB — TYPE AND SCREEN
ABO/RH(D): O POS
Antibody Screen: NEGATIVE

## 2016-07-14 LAB — PROTIME-INR
INR: 2.88
PROTHROMBIN TIME: 30.8 s — AB (ref 11.4–15.2)

## 2016-07-14 LAB — GLUCOSE, CAPILLARY: Glucose-Capillary: 150 mg/dL — ABNORMAL HIGH (ref 65–99)

## 2016-07-14 MED ORDER — HYDROMORPHONE HCL 1 MG/ML IJ SOLN
1.0000 mg | Freq: Once | INTRAMUSCULAR | Status: AC
  Administered 2016-07-14: 1 mg via INTRAVENOUS

## 2016-07-14 MED ORDER — ONDANSETRON HCL 4 MG/2ML IJ SOLN
4.0000 mg | Freq: Once | INTRAMUSCULAR | Status: AC
  Administered 2016-07-14: 4 mg via INTRAVENOUS

## 2016-07-14 MED ORDER — FENTANYL CITRATE (PF) 100 MCG/2ML IJ SOLN
100.0000 ug | INTRAMUSCULAR | Status: DC | PRN
  Administered 2016-07-14 (×3): 100 ug via INTRAVENOUS
  Filled 2016-07-14 (×3): qty 2

## 2016-07-14 MED ORDER — METOPROLOL TARTRATE 5 MG/5ML IV SOLN
5.0000 mg | INTRAVENOUS | Status: DC | PRN
  Administered 2016-07-14: 5 mg via INTRAVENOUS
  Filled 2016-07-14: qty 5

## 2016-07-14 MED ORDER — HYDROMORPHONE HCL 1 MG/ML IJ SOLN
INTRAMUSCULAR | Status: DC
Start: 2016-07-14 — End: 2016-07-15
  Filled 2016-07-14: qty 1

## 2016-07-14 MED ORDER — PROMETHAZINE HCL 25 MG/ML IJ SOLN
12.5000 mg | Freq: Once | INTRAMUSCULAR | Status: AC
  Administered 2016-07-14: 12.5 mg via INTRAVENOUS
  Filled 2016-07-14: qty 1

## 2016-07-14 MED ORDER — NICARDIPINE HCL IN NACL 20-0.86 MG/200ML-% IV SOLN
3.0000 mg/h | Freq: Once | INTRAVENOUS | Status: AC
  Administered 2016-07-14: 3 mg/h via INTRAVENOUS
  Filled 2016-07-14: qty 200

## 2016-07-14 MED ORDER — MORPHINE SULFATE (PF) 4 MG/ML IV SOLN
4.0000 mg | Freq: Once | INTRAVENOUS | Status: AC
  Administered 2016-07-14: 4 mg via INTRAVENOUS
  Filled 2016-07-14: qty 1

## 2016-07-14 MED ORDER — ONDANSETRON HCL 4 MG/2ML IJ SOLN
INTRAMUSCULAR | Status: AC
  Administered 2016-07-14: 4 mg via INTRAVENOUS
  Filled 2016-07-14: qty 2

## 2016-07-14 MED ORDER — IOPAMIDOL (ISOVUE-370) INJECTION 76%
100.0000 mL | Freq: Once | INTRAVENOUS | Status: AC | PRN
  Administered 2016-07-14: 100 mL via INTRAVENOUS

## 2016-07-14 MED ORDER — ACETAMINOPHEN 10 MG/ML IV SOLN
1000.0000 mg | Freq: Four times a day (QID) | INTRAVENOUS | Status: DC
  Administered 2016-07-14: 1000 mg via INTRAVENOUS
  Filled 2016-07-14 (×3): qty 100

## 2016-07-14 MED ORDER — AMIODARONE HCL 100 MG PO TABS
100.0000 mg | ORAL_TABLET | ORAL | Status: AC
  Administered 2016-07-14: 100 mg via ORAL
  Filled 2016-07-14: qty 1

## 2016-07-14 NOTE — ED Notes (Signed)
Patient transported to CT 

## 2016-07-14 NOTE — Telephone Encounter (Signed)
Pt has appt with Dr Darnell Level on 07/14/16 at 2:45; but per chart review tab appears pt is at [facility] ED now. This TH note did not come to my desktop but found on portal.

## 2016-07-14 NOTE — ED Notes (Signed)
EDP notified ofg Pt's Afib and HR

## 2016-07-14 NOTE — ED Triage Notes (Signed)
Pt reports current AAA that is due to be repaired later this month at [facility]. C/o abd pain radiating around into mid back.

## 2016-07-14 NOTE — Telephone Encounter (Signed)
[place]  Patient Name: [name]  DOB: 02-Oct-1946    Initial Comment Caller States her husband is having abd pain   Nurse Assessment  Nurse: Wynetta Emery, RN, Baker Janus Date/Time (Eastern Time): 07/14/2016 8:43:59 AM  Confirm and document reason for call. If symptomatic, describe symptoms. You must click the next button to save text entered. ---[name] started having lower abdominal pain x 5 days back is sore also.  Has the patient traveled out of the country within the last 30 days? ---No  Does the patient have any new or worsening symptoms? ---Yes  Will a triage be completed? ---Yes  Related visit to physician within the last 2 weeks? ---No  Does the PT have any chronic conditions? (i.e. diabetes, asthma, etc.) ---Unknown  Is this a behavioral health or substance abuse call? ---No     Guidelines    Guideline Title Affirmed Question Affirmed Notes  Abdominal Pain - Male [1] SEVERE pain (e.g., excruciating) AND [2] present > 1 hour    Final Disposition User   Go to ED Now Wynetta Emery, RN, Baker Janus    Comments  NOTE REFUSED TO GO TO ED DOES NOT WANT TO SIT AND WAIT -- WANTS APPT WITH MD 07-14-2016 245PM DR. GUITERREZ C/O SEVERE STOMACH PAIN X 5 DAYS VOMIT X 1 BACK PAIN   Referrals  GO TO FACILITY REFUSED   Disagree/Comply: Disagree  Disagree/Comply Reason: Disagree with instructions

## 2016-07-14 NOTE — ED Provider Notes (Addendum)
[facility] Emergency Department Provider Note    First MD Initiated Contact with Patient 07/14/16 1435     (approximate)  I have reviewed the triage vital signs and the nursing notes.   HISTORY  Chief Complaint Abdominal Pain    HPI [name] is a 70 y.o. male with history of AAA status post repair now with a suprarenal pararenal aneurysm that is in plans for operative repair later this month presents to the ER for 5 days of worsening nausea and hyperemesis and abdominal pain with radiation to his left flank. States that he has not moved his bowels in 5 days.Patient denies any fevers. Works as a Magazine features editor and had to be picked up by significant other due to recurrent nausea vomiting and inability to drive. Probably denies any chest pain does feel shortness of breath in between emesis. Denies any blood in his stools denies any blood in his vomit.   Past Medical History:  Diagnosis Date  . AAA (abdominal aortic aneurysm) ([place]) 2013   s/p stent graft repair now with supra/pararenal aneurysm 3.5cm, referred to Dr. Sammuel Hines at [facility] for endovascular repair (12/2013)  . Abnormal drug screen 06/2015   inapprop neg norco (06/2015), inappr neg narcotics (02/2016)  . Cervical neck pain with evidence of disc disease 07/2011   MRI - disk bulging and foraminal stenosis, advanced at C4/5, 5/6; rec pain management for ESI by Dr. Mack Guise   . CHF (congestive heart failure) ([place])   . COPD (chronic obstructive pulmonary disease) (HCC)    mod-severe COPD/emphysema.  PFTs 12/2010.  He still smokes 1 ppd.  . Depression   . ED (erectile dysfunction) 02/2012   penile injections - failed viagra, poor arterial flow ([place])  . Fatty liver   . GERD (gastroesophageal reflux disease)   . Hyperlipidemia    myalgias with simvastatin and atorvastatin  . Leg cramps    idiopathic severe  . Muscle spasm    chronic  . Neuralgia    pain in hands. L>R from accident  .  Obesity   . OSA (obstructive sleep apnea) 03/2012   AHI 18.6, desat to 74%, severe snoring, consider ENT eval  . Osteoarthritis   . Paroxysmal atrial fibrillation (HCC)    on coumadin only.  . Peripheral autonomic neuropathy due to diabetes mellitus ([place])   . Right shoulder injury 05/2012   after fall out of chair, s/p injection, rec conservative management with PT Noemi Chapel)  . Smoker    1ppd  . T2DM (type 2 diabetes mellitus) [facility])     Patient Active Problem List   Diagnosis Date Noted  . Current use of long term anticoagulation 06/30/2016  . Chronic pain syndrome 03/07/2016  . Right shoulder pain 01/03/2016  . Acute bronchitis 01/03/2016  . Left-sided low back pain with left-sided sciatica 12/18/2015  . Left knee pain 12/18/2015  . Weight loss 06/08/2015  . Abnormal drug screen 06/08/2015  . Olecranon bursitis of left elbow 09/01/2014  . Bradycardia 06/23/2014  . Nasal congestion 05/11/2014  . Diarrhea 04/24/2013  . DDD (degenerative disc disease), cervical 04/03/2012  . Face lesion 04/03/2012  . OSA (obstructive sleep apnea) 03/08/2012  . AAA (abdominal aortic aneurysm) without rupture ([place] 10/22/2011  . 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/20/2011  . Polyuria 09/08/2011  . Neck pain, chronic 05/23/2011  . Type 2 diabetes, uncontrolled, with neuropathy ([place]) 02/27/2011  . Polyneuropathy in diabetes(357.2) 02/27/2011  . DYSPNEA ON EXERTION 12/30/2010  .  PERS HX NONCOMPLIANCE W/MED TX PRS HAZARDS HLTH 11/28/2010  . RESTRICTIVE LUNG DISEASE 10/17/2010  . INSOMNIA 10/17/2010  . Hyperlipidemia 09/19/2010  . ABNORMAL ELECTROCARDIOGRAM 09/19/2010  . Atrial fibrillation ([place]) 09/18/2010  . PARESTHESIA, HANDS 09/18/2010  . ARTHRALGIA-JOINT PAIN 07/14/2010  . Esophageal reflux 04/21/2010  . LEG CRAMPS, IDIOPATHIC 04/21/2010  . Fatty liver 04/21/2010  . Tobacco abuse 03/28/2010  . HEMATURIA, HX OF 03/28/2010  . COPD mixed type ([place]) 03/21/2010    Past Surgical  History:  Procedure Laterality Date  . CHOLECYSTECTOMY  2001  . CTA abd  09/2011   6.1cm AAA, bilateral ing hernias, R with bladder wall, promient prostate calcifications  . ENDOVASCULAR STENT INSERTION  11/11/2011   Procedure: ENDOVASCULAR STENT GRAFT INSERTION;  Surgeon: Angelia Mould, MD;  Location: [facility];  Service: Vascular;  Laterality: N/A;  aorta bi iliac  . KNEE SURGERY     L side cartilage taken out  . PFTs  12/2010   mod-severe obstruction, ?bronchodilator response  . TONSILLECTOMY      Prior to Admission medications   Medication Sig Start Date End Date Taking? Authorizing Provider  amiodarone (PACERONE) 100 MG tablet Take 100 mg by mouth daily.    Historical Provider, MD  budesonide-formoterol (SYMBICORT) 160-4.5 MCG/ACT inhaler Inhale 1 puff into the lungs 2 (two) times daily.    Historical Provider, MD  Coenzyme Q10 100 MG capsule Take 1 capsule (100 mg total) by mouth daily. 03/25/11   Minna Merritts, MD  diphenoxylate-atropine (LOMOTIL) 2.5-0.025 MG tablet Take 1 tablet by mouth 2 (two) times daily as needed for diarrhea or loose stools. 04/18/16   Ria Bush, MD  doxycycline (VIBRA-TABS) 100 MG tablet Take 1 tablet (100 mg total) by mouth 2 (two) times daily. 06/30/16   Ria Bush, MD  fish oil-omega-3 fatty acids 1000 MG capsule Take 2 g by mouth daily.     Historical Provider, MD  fluticasone furoate-vilanterol (BREO ELLIPTA) 200-25 MCG/INH AEPB Inhale 1 puff into the lungs daily. 06/30/16   Ria Bush, MD  furosemide (LASIX) 20 MG tablet TAKE 1 TABLET BY MOUTH AS NEEDED FOR FLUID 01/07/16   Ria Bush, MD  gabapentin (NEURONTIN) 600 MG tablet TAKE 1 TABLET BY MOUTH THREE TIMES DAILY 04/24/16   Ria Bush, MD  glucose blood (ONE TOUCH ULTRA TEST) test strip Check blood sugar once daily and as needed. 250.60 04/23/12   Ria Bush, MD  Glucose Blood (ONETOUCH ULTRA BLUE VI) by In Vitro route. Test blood glucose 1 time per day and as  directed.     Historical Provider, MD  HYDROcodone-acetaminophen (NORCO) 10-325 MG tablet Take 1 tablet by mouth every 8 (eight) hours as needed. 06/30/16   Ria Bush, MD  metFORMIN (GLUCOPHAGE) 500 MG tablet Take 1 tablet (500 mg total) by mouth 2 (two) times daily with a meal. 01/03/16   Ria Bush, MD  nitroGLYCERIN (NITROLINGUAL) 0.4 MG/SPRAY spray Place 1 spray under the tongue every 5 (five) minutes as needed. 02/29/16   Minna Merritts, MD  omeprazole (PRILOSEC) 40 MG capsule TAKE 1 CAPSULE BY MOUTH EVERY DAY 09/07/15   Ria Bush, MD  potassium chloride (K-DUR,KLOR-CON) 10 MEQ tablet Take 10 mEq by mouth daily. If lasix taken 01/03/16   Ria Bush, MD  PROAIR HFA 108 [phone] Base) MCG/ACT inhaler INHALE 2 PUFFS BY MOUTH EVERY 6 HOURS AS NEEDED FOR WHEEZING OR SHORTNESS OF BREATH 04/07/16   Ria Bush, MD  sitaGLIPtin (JANUVIA) 50 MG tablet Take 1 tablet (  50 mg total) by mouth daily. 03/07/16   Ria Bush, MD  tiZANidine (ZANAFLEX) 4 MG tablet TAKE 1 TABLET BY MOUTH TWICE DAILY AS NEEDED FOR MUSCLE SPASMS 03/31/16   Ria Bush, MD  warfarin (COUMADIN) 5 MG tablet TAKE AS DIRECTED BY COUMADIN CLINIC 05/19/16   Minna Merritts, MD    Allergies Varenicline tartrate; Wellbutrin [bupropion hcl]; and Zocor [simvastatin]  Family History  Problem Relation Age of Onset  . Heart disease Father   . Leukemia Father   . Coronary artery disease Father   . Melanoma Sister   . Arthritis Mother   . Diabetes Neg Hx   . Stroke Neg Hx     Social History Social History  Substance Use Topics  . Smoking status: Current Every Day Smoker    Packs/day: 1.50    Years: 52.00    Types: Cigarettes  . Smokeless tobacco: Never Used  . Alcohol use 0.0 oz/week     Comment: occasional, 1 glass margarita or glass of wine once every 6 months; occasional beer with tomato juice    Review of Systems Patient denies headaches, rhinorrhea, blurry vision, numbness, shortness of  breath, chest pain, edema, cough, abdominal pain, nausea, vomiting, diarrhea, dysuria, fevers, rashes or hallucinations unless otherwise stated above in HPI. ____________________________________________   PHYSICAL EXAM:  VITAL SIGNS: Vitals:   07/14/16 1443 07/14/16 1600  BP: (!) 175/91   Pulse: (!) 47 (!) 52  Resp: 20 17  Temp:      Constitutional: Alert and oriented. Ill appearing, dry heaving,  Eyes: Conjunctivae are normal. PERRL. EOMI. Head: Atraumatic. Nose: No congestion/rhinnorhea. Mouth/Throat: Mucous membranes are moist.  Oropharynx non-erythematous. Neck: No stridor. Painless ROM. No cervical spine tenderness to palpation Hematological/Lymphatic/Immunilogical: No cervical lymphadenopathy. Cardiovascular: Normal rate, regular rhythm. Grossly normal heart sounds.  Good peripheral circulation. Respiratory: Normal respiratory effort.  No retractions. Lungs CTAB. Gastrointestinal: Soft and nontender.obese, no hernia, no peritonitis. No palpable pulsatile masses, No CVA tenderness. Musculoskeletal: No lower extremity tenderness nor edema.  No joint effusions. Neurologic:  Normal speech and language. No gross focal neurologic deficits are appreciated. No gait instability. Skin:  Skin is warm, dry and intact. No rash noted.  ____________________________________________   LABS (all labs ordered are listed, but only abnormal results are displayed)  Results for orders placed or performed during the hospital encounter of 07/14/16 (from the past 24 hour(s))  Lipase, blood     Status: None   Collection Time: 07/14/16 12:16 PM  Result Value Ref Range   Lipase 17 11 - 51 U/L  Comprehensive metabolic panel     Status: Abnormal   Collection Time: 07/14/16 12:16 PM  Result Value Ref Range   Sodium 133 (L) 135 - 145 mmol/L   Potassium 4.5 3.5 - 5.1 mmol/L   Chloride 97 (L) 101 - 111 mmol/L   CO2 28 22 - 32 mmol/L   Glucose, Bld 151 (H) 65 - 99 mg/dL   BUN 9 6 - 20 mg/dL    Creatinine, Ser 0.77 0.61 - 1.24 mg/dL   Calcium 8.9 8.9 - 10.3 mg/dL   Total Protein 7.2 6.5 - 8.1 g/dL   Albumin 3.4 (L) 3.5 - 5.0 g/dL   AST 17 15 - 41 U/L   ALT 15 (L) 17 - 63 U/L   Alkaline Phosphatase 79 38 - 126 U/L   Total Bilirubin 1.1 0.3 - 1.2 mg/dL   GFR calc non Af Amer >60 >60 mL/min   GFR calc Af Amer >  60 >60 mL/min   Anion gap 8 5 - 15  CBC     Status: Abnormal   Collection Time: 07/14/16 12:16 PM  Result Value Ref Range   WBC 16.1 (H) 3.8 - 10.6 K/uL   RBC 5.22 4.40 - 5.90 MIL/uL   Hemoglobin 14.6 13.0 - 18.0 g/dL   HCT 43.3 40.0 - 52.0 %   MCV 83.0 80.0 - 100.0 fL   MCH 28.0 26.0 - 34.0 pg   MCHC 33.7 32.0 - 36.0 g/dL   RDW 15.2 (H) 11.5 - 14.5 %   Platelets 270 150 - 440 K/uL  APTT     Status: Abnormal   Collection Time: 07/14/16 12:35 PM  Result Value Ref Range   aPTT 69 (H) 24 - 36 seconds  Protime-INR     Status: Abnormal   Collection Time: 07/14/16 12:35 PM  Result Value Ref Range   Prothrombin Time 30.8 (H) 11.4 - 15.2 seconds   INR 2.88   Urinalysis complete, with microscopic     Status: Abnormal   Collection Time: 07/14/16  2:33 PM  Result Value Ref Range   Color, Urine YELLOW (A) YELLOW   APPearance CLEAR (A) CLEAR   Glucose, UA NEGATIVE NEGATIVE mg/dL   Bilirubin Urine NEGATIVE NEGATIVE   Ketones, ur 1+ (A) NEGATIVE mg/dL   Specific Gravity, Urine 1.032 (H) 1.005 - 1.030   Hgb urine dipstick 1+ (A) NEGATIVE   pH 6.0 5.0 - 8.0   Protein, ur NEGATIVE NEGATIVE mg/dL   Nitrite NEGATIVE NEGATIVE   Leukocytes, UA TRACE (A) NEGATIVE   RBC / HPF 0-5 0 - 5 RBC/hpf   WBC, UA 0-5 0 - 5 WBC/hpf   Bacteria, UA NONE SEEN NONE SEEN   Squamous Epithelial / LPF NONE SEEN NONE SEEN   Mucous PRESENT   Type and screen [facility]     Status: None   Collection Time: 07/14/16  2:33 PM  Result Value Ref Range   ABO/RH(D) O POS    Antibody Screen NEG    Sample Expiration 07/17/2016     ____________________________________________  EKG  Time: 12:40  Indication: abdominal pain  Rate: 55  Rhythm: NSR Axis: normal Other: nonspecific ST changes, no acute ischemia ____________________________________________  RADIOLOGY  CTA abd: IMPRESSION: The patient is status post endovascular repair of abdominal aortic aneurysm with bifurcated endo graft extending from just below the left renal artery into the bilateral common iliac artery. No evidence of endoleak or migration. Since the comparison CT of 2015, there has been further degeneration of the abdominal aorta, now with fusiform juxta-renal aneurysm. Greatest diameter measures 6.3 cm just above the proximal graft, which is at least 1 cm greater than the prior CT.  Inflammatory changes centered around the proximal superior mesenteric artery. Uncertain if these changes are related to the aneurysm degeneration, and potentially indicative of a pending rupture, or if this represents a vasculitis of the mesenteric artery. Surgical evaluation is recommended, as well as comparison to any interval CT studies after 12/20/2013. Differential should also consider pancreatitis, although this is considered less likely than inflammation related to the vasculature.  These results were called by telephone at the time of interpretation on 07/14/2016 at 3:40 pm to Dr. Quentin Cornwall, who verbally acknowledged these results. ____________________________________________   PROCEDURES  Procedure(s) performed: none    Critical Care performed: yes CRITICAL CARE Performed by: Merlyn Lot   Total critical care time: 35 minutes  Critical care time was exclusive of separately  billable procedures and treating other patients.  Critical care was necessary to treat or prevent imminent or life-threatening deterioration.  Critical care was time spent personally by me on the following activities: development of treatment plan with patient  and/or surrogate as well as nursing, discussions with consultants, evaluation of patient's response to treatment, examination of patient, obtaining history from patient or surrogate, ordering and performing treatments and interventions, ordering and review of laboratory studies, ordering and review of radiographic studies, pulse oximetry and re-evaluation of patient's condition.  ____________________________________________   INITIAL IMPRESSION / ASSESSMENT AND PLAN / ED COURSE  Pertinent labs & imaging results that were available during my care of the patient were reviewed by me and considered in my medical decision making (see chart for details).  DDX: Ruptured AAA, abdominal dissection, pancreatitis, gastroenteritis, food poisoning, small bowel obstruction  [name] is a 70 y.o. who presents to the ED with 5 days of abdominal pain, back pain nausea and vomiting. Patient arrives afebrile uncomfortable appearing but hemodynamically stable. His abdominal exam does not reveal any peritonitis but he does report pain radiating to his left flank and given the known AAA and suprarenal aneurysm will order CT abdomen to evaluate for evidence of ruptured aneurysm or expanding aneurysm.  Clinical Course  Comment By Time  Was contacted by radiologist regarding critical finding on CT abdomen showing evidence of inflamed juxtarenal aneurysm involving the SMA with surrounding fat stranding. No other evidence of cause for her acute back pain.  Reassessed patient is hemodynamic stable at this time but still complaining of discomfort. Based on his complex surgical history patient has requested contact with [facility] Dr. Sammuel Hines.  I have paged the transfer center. Merlyn Lot, MD 08/07 1539   ----------------------------------------- 4:20 PM on 07/14/2016 -----------------------------------------  Discussed results and presentation with Dr. Sammuel Hines who agrees to evaluate patient at [facility]. Updated  patient on plan inpatient family reiterate desire to be evaluated at [facility]. Patient currently hemodynamic stable. Will be transferred to tertiary center.  Have discussed with the patient and available family all diagnostics and treatments performed thus far and all questions were answered to the best of my ability. The patient demonstrates understanding and agreement with plan.   ____________________________________________   FINAL CLINICAL IMPRESSION(S) / ED DIAGNOSES  Final diagnoses:  Epigastric pain  Abnormal CT of the abdomen  AAA (abdominal aortic aneurysm) without rupture (HCC)  Non-intractable vomiting with nausea, vomiting of unspecified type      NEW MEDICATIONS STARTED DURING THIS VISIT:  New Prescriptions   No medications on file     Note:  This document was prepared using Dragon voice recognition software and may include unintentional dictation errors.    Merlyn Lot, MD 07/14/16 [place], MD 07/14/16 [phone]

## 2016-07-14 NOTE — Telephone Encounter (Signed)
Spouse called back canceling todays appointment they took pt to [facility]

## 2016-07-14 NOTE — ED Notes (Signed)
Pt reports pain still 10/10, pt diaphoretic. EDP made aware

## 2016-07-15 ENCOUNTER — Ambulatory Visit (HOSPITAL_COMMUNITY)
Admission: AD | Admit: 2016-07-15 | Discharge: 2016-07-15 | Disposition: A | Discharge status: Home / Self Care | Payer: Medicare Other | Source: Other Acute Inpatient Hospital | Attending: Student in an Organized Health Care Education/Training Program | Admitting: Student in an Organized Health Care Education/Training Program

## 2016-07-15 DIAGNOSIS — Z7901 Long term (current) use of anticoagulants: Secondary | ICD-10-CM | POA: Diagnosis not present

## 2016-07-15 DIAGNOSIS — E7800 Pure hypercholesterolemia, unspecified: Secondary | ICD-10-CM | POA: Diagnosis present

## 2016-07-15 DIAGNOSIS — R001 Bradycardia, unspecified: Secondary | ICD-10-CM | POA: Diagnosis not present

## 2016-07-15 DIAGNOSIS — Z720 Tobacco use: Secondary | ICD-10-CM | POA: Diagnosis not present

## 2016-07-15 DIAGNOSIS — Z794 Long term (current) use of insulin: Secondary | ICD-10-CM | POA: Diagnosis not present

## 2016-07-15 DIAGNOSIS — K760 Fatty (change of) liver, not elsewhere classified: Secondary | ICD-10-CM | POA: Diagnosis not present

## 2016-07-15 DIAGNOSIS — I517 Cardiomegaly: Secondary | ICD-10-CM | POA: Diagnosis not present

## 2016-07-15 DIAGNOSIS — G4733 Obstructive sleep apnea (adult) (pediatric): Secondary | ICD-10-CM | POA: Diagnosis present

## 2016-07-15 DIAGNOSIS — R739 Hyperglycemia, unspecified: Secondary | ICD-10-CM | POA: Diagnosis not present

## 2016-07-15 DIAGNOSIS — I509 Heart failure, unspecified: Secondary | ICD-10-CM | POA: Diagnosis present

## 2016-07-15 DIAGNOSIS — F17210 Nicotine dependence, cigarettes, uncomplicated: Secondary | ICD-10-CM | POA: Diagnosis present

## 2016-07-15 DIAGNOSIS — I716 Thoracoabdominal aortic aneurysm, without rupture: Secondary | ICD-10-CM | POA: Diagnosis not present

## 2016-07-15 DIAGNOSIS — I714 Abdominal aortic aneurysm, without rupture: Secondary | ICD-10-CM | POA: Diagnosis present

## 2016-07-15 DIAGNOSIS — Z79899 Other long term (current) drug therapy: Secondary | ICD-10-CM | POA: Diagnosis not present

## 2016-07-15 DIAGNOSIS — J449 Chronic obstructive pulmonary disease, unspecified: Secondary | ICD-10-CM | POA: Diagnosis not present

## 2016-07-15 DIAGNOSIS — Z7984 Long term (current) use of oral hypoglycemic drugs: Secondary | ICD-10-CM | POA: Diagnosis not present

## 2016-07-15 DIAGNOSIS — I700 Atherosclerosis of aorta: Secondary | ICD-10-CM | POA: Diagnosis not present

## 2016-07-15 DIAGNOSIS — E119 Type 2 diabetes mellitus without complications: Secondary | ICD-10-CM | POA: Diagnosis present

## 2016-07-15 DIAGNOSIS — R9431 Abnormal electrocardiogram [ECG] [EKG]: Secondary | ICD-10-CM | POA: Diagnosis not present

## 2016-07-15 DIAGNOSIS — I110 Hypertensive heart disease with heart failure: Secondary | ICD-10-CM | POA: Diagnosis present

## 2016-07-15 DIAGNOSIS — J439 Emphysema, unspecified: Secondary | ICD-10-CM | POA: Diagnosis present

## 2016-07-15 DIAGNOSIS — K5730 Diverticulosis of large intestine without perforation or abscess without bleeding: Secondary | ICD-10-CM | POA: Diagnosis not present

## 2016-07-15 DIAGNOSIS — N2889 Other specified disorders of kidney and ureter: Secondary | ICD-10-CM | POA: Diagnosis not present

## 2016-07-15 DIAGNOSIS — I10 Essential (primary) hypertension: Secondary | ICD-10-CM | POA: Diagnosis not present

## 2016-07-15 DIAGNOSIS — Z01818 Encounter for other preprocedural examination: Secondary | ICD-10-CM | POA: Diagnosis not present

## 2016-07-15 DIAGNOSIS — I4891 Unspecified atrial fibrillation: Secondary | ICD-10-CM | POA: Diagnosis present

## 2016-07-15 DIAGNOSIS — I77819 Aortic ectasia, unspecified site: Secondary | ICD-10-CM | POA: Diagnosis not present

## 2016-07-15 DIAGNOSIS — K219 Gastro-esophageal reflux disease without esophagitis: Secondary | ICD-10-CM | POA: Diagnosis present

## 2016-07-15 DIAGNOSIS — K2970 Gastritis, unspecified, without bleeding: Secondary | ICD-10-CM | POA: Diagnosis present

## 2016-07-15 DIAGNOSIS — I160 Hypertensive urgency: Secondary | ICD-10-CM | POA: Diagnosis not present

## 2016-07-15 DIAGNOSIS — M549 Dorsalgia, unspecified: Secondary | ICD-10-CM | POA: Diagnosis present

## 2016-07-15 DIAGNOSIS — Z7951 Long term (current) use of inhaled steroids: Secondary | ICD-10-CM | POA: Diagnosis not present

## 2016-07-15 DIAGNOSIS — Z79891 Long term (current) use of opiate analgesic: Secondary | ICD-10-CM | POA: Diagnosis not present

## 2016-07-15 MED ORDER — MORPHINE SULFATE (PF) 2 MG/ML IV SOLN
INTRAVENOUS | Status: AC
  Administered 2016-07-15: 4 mg via INTRAVENOUS
  Filled 2016-07-15: qty 2

## 2016-07-15 MED ORDER — MORPHINE SULFATE (PF) 2 MG/ML IV SOLN
4.0000 mg | Freq: Once | INTRAVENOUS | Status: AC
  Administered 2016-07-15: 4 mg via INTRAVENOUS

## 2016-07-15 NOTE — ED Notes (Signed)
[facility] arrived to transport pt, informed Cindy of Cardene rate change.

## 2016-07-15 NOTE — Telephone Encounter (Signed)
Will see tomorrow

## 2016-07-16 ENCOUNTER — Other Ambulatory Visit: Payer: Self-pay | Admitting: Family Medicine

## 2016-07-16 ENCOUNTER — Ambulatory Visit (INDEPENDENT_AMBULATORY_CARE_PROVIDER_SITE_OTHER): Payer: Medicare Other | Admitting: Family Medicine

## 2016-07-16 ENCOUNTER — Encounter: Payer: Self-pay | Admitting: Family Medicine

## 2016-07-16 VITALS — BP 140/82 | HR 81 | Temp 98.9°F | Resp 18 | Wt 227.0 lb

## 2016-07-16 DIAGNOSIS — I714 Abdominal aortic aneurysm, without rupture, unspecified: Secondary | ICD-10-CM

## 2016-07-16 DIAGNOSIS — E1165 Type 2 diabetes mellitus with hyperglycemia: Secondary | ICD-10-CM

## 2016-07-16 DIAGNOSIS — N400 Enlarged prostate without lower urinary tract symptoms: Secondary | ICD-10-CM | POA: Diagnosis not present

## 2016-07-16 DIAGNOSIS — R1084 Generalized abdominal pain: Secondary | ICD-10-CM

## 2016-07-16 DIAGNOSIS — Z720 Tobacco use: Secondary | ICD-10-CM | POA: Diagnosis not present

## 2016-07-16 DIAGNOSIS — E1140 Type 2 diabetes mellitus with diabetic neuropathy, unspecified: Secondary | ICD-10-CM

## 2016-07-16 DIAGNOSIS — IMO0002 Reserved for concepts with insufficient information to code with codable children: Secondary | ICD-10-CM

## 2016-07-16 LAB — CBC WITH DIFFERENTIAL/PLATELET
BASOS ABS: 0.1 10*3/uL (ref 0.0–0.1)
BASOS PCT: 0.4 % (ref 0.0–3.0)
EOS ABS: 0.2 10*3/uL (ref 0.0–0.7)
Eosinophils Relative: 1.6 % (ref 0.0–5.0)
HEMATOCRIT: 43.9 % (ref 39.0–52.0)
HEMOGLOBIN: 14.5 g/dL (ref 13.0–17.0)
LYMPHS PCT: 15.7 % (ref 12.0–46.0)
Lymphs Abs: 2 10*3/uL (ref 0.7–4.0)
MCHC: 33.1 g/dL (ref 30.0–36.0)
MCV: 84 fl (ref 78.0–100.0)
MONO ABS: 0.8 10*3/uL (ref 0.1–1.0)
Monocytes Relative: 6.7 % (ref 3.0–12.0)
Neutro Abs: 9.5 10*3/uL — ABNORMAL HIGH (ref 1.4–7.7)
Neutrophils Relative %: 75.6 % (ref 43.0–77.0)
Platelets: 344 10*3/uL (ref 150.0–400.0)
RBC: 5.23 Mil/uL (ref 4.22–5.81)
RDW: 15.5 % (ref 11.5–15.5)
WBC: 12.5 10*3/uL — AB (ref 4.0–10.5)

## 2016-07-16 LAB — PSA: PSA: 11.51 ng/mL — AB (ref 0.10–4.00)

## 2016-07-16 LAB — HEMOGLOBIN A1C: HEMOGLOBIN A1C: 8 % — AB (ref 4.6–6.5)

## 2016-07-16 MED ORDER — OXYCODONE-ACETAMINOPHEN 5-325 MG PO TABS: 1.0000 | ORAL_TABLET | Freq: Three times a day (TID) | ORAL | 0 refills | Status: DC | PRN

## 2016-07-16 NOTE — Progress Notes (Signed)
BP 140/82   Pulse 81   Temp 98.9 F (37.2 C) (Oral)   Resp 18   Wt 227 lb (103 kg)   SpO2 93%   BMI 29.95 kg/m    CC: hosp f/u visit Subjective:    Patient ID: [name], male    DOB: [birth year], 70 y.o.   MRN: [id number]  HPI: [name] is a 70 y.o. male presenting on 07/16/2016 for Abdominal Pain   Here with wife for hospital f/u visit. Notes available reviewed. Initially seen at [facility] ER with abdominal pain with radiation to left flank, constipation, and nausea/vomiting. 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 No signs of diverticulitis on initial CT. Transferred to [facility] stable with Hgb 15.4, plt 318, Cr 0.8, LFTs stable. WBC elevated at 12.2 and CRP high at 150.   No notes from any physicians available today. However patient states he was told abdominal pain was not related to aneurysm. Pain treated with IV morphine, discharged with neurontin and oxycodone.   Has not had appetite for last 5 days, with nausea, constipation. No bowel movement over last 5 days despite 5 dulcolax at once. Passing gas ok. Remains without appetite. Abd pain starts bilateral sides, travels to front with some radiation to back.   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   Relevant past medical, surgical, family and social history reviewed and updated as indicated. Interim medical history since our last visit reviewed. Allergies and medications reviewed and updated. Current Outpatient Prescriptions on File Prior  to Visit  Medication Sig  . amiodarone (PACERONE) 100 MG tablet Take 100 mg by mouth daily.  . budesonide-formoterol (SYMBICORT) 160-4.5 MCG/ACT inhaler Inhale 1 puff into the lungs 2 (two) times daily.  . Coenzyme Q10 100 MG capsule Take 1 capsule (100 mg total) by mouth daily.  . diphenoxylate-atropine (LOMOTIL) 2.5-0.025 MG tablet Take 1 tablet by mouth 2 (two) times daily as needed for diarrhea or loose stools.  Marland Kitchen doxycycline (VIBRA-TABS) 100 MG tablet Take 1 tablet (100 mg total) by mouth 2 (two) times daily.  . fish oil-omega-3 fatty acids 1000 MG capsule Take 2 g by mouth daily.   . fluticasone furoate-vilanterol (BREO ELLIPTA) 200-25 MCG/INH AEPB Inhale 1 puff into the lungs daily.  . furosemide (LASIX) 20 MG tablet TAKE 1 TABLET BY MOUTH AS NEEDED FOR FLUID  . gabapentin (NEURONTIN) 600 MG tablet TAKE 1 TABLET BY MOUTH THREE TIMES DAILY  . glucose blood (ONE TOUCH ULTRA TEST) test strip Check blood sugar once daily and as needed. 250.60  . Glucose Blood (ONETOUCH ULTRA BLUE VI) by In Vitro route. Test blood glucose 1 time per day and as directed.   Marland Kitchen HYDROcodone-acetaminophen (NORCO) 10-325 MG tablet Take 1 tablet by mouth every 8 (eight) hours as needed.  . metFORMIN (GLUCOPHAGE) 500 MG tablet Take 1 tablet (500 mg total) by mouth 2 (two) times daily with a meal.  . nitroGLYCERIN (NITROLINGUAL) 0.4 MG/SPRAY spray Place 1 spray under the tongue every 5 (five) minutes as needed.  Marland Kitchen  omeprazole (PRILOSEC) 40 MG capsule TAKE 1 CAPSULE BY MOUTH EVERY DAY  . potassium chloride (K-DUR,KLOR-CON) 10 MEQ tablet Take 10 mEq by mouth daily. If lasix taken  . PROAIR HFA 108 (90 Base) MCG/ACT inhaler INHALE 2 PUFFS BY MOUTH EVERY 6 HOURS AS NEEDED FOR WHEEZING OR SHORTNESS OF BREATH  . sitaGLIPtin (JANUVIA) 50 MG tablet Take 1 tablet (50 mg total) by mouth daily.  Marland Kitchen tiZANidine (ZANAFLEX) 4 MG tablet TAKE 1 TABLET BY MOUTH TWICE DAILY AS NEEDED FOR MUSCLE SPASMS  . warfarin (COUMADIN) 5 MG tablet TAKE  AS DIRECTED BY COUMADIN CLINIC   No current facility-administered medications on file prior to visit.     Review of Systems Per HPI unless specifically indicated in ROS section     Objective:    BP 140/82   Pulse 81   Temp 98.9 F (37.2 C) (Oral)   Resp 18   Wt 227 lb (103 kg)   SpO2 93%   BMI 29.95 kg/m   Wt Readings from Last 3 Encounters:  07/16/16 227 lb (103 kg)  06/30/16 236 lb 4 oz (107.2 kg)  04/10/16 241 lb (109.3 kg)    Physical Exam  Constitutional: He appears well-developed and well-nourished. No distress.  HENT:  Mouth/Throat: Oropharynx is clear and moist. No oropharyngeal exudate.  Cardiovascular: Normal rate, regular rhythm, normal heart sounds and intact distal pulses.   No murmur heard. Pulmonary/Chest: Effort normal and breath sounds normal. No respiratory distress. He has no wheezes. He has no rales.  Coarse sounds  Abdominal: Soft. Bowel sounds are normal. He exhibits no distension and no mass. There is no hepatosplenomegaly. There is tenderness (mild diffuse). There is no rigidity, no rebound, no guarding, no CVA tenderness and negative Murphy's sign.  Genitourinary: Rectum normal. Rectal exam shows no external hemorrhoid, no internal hemorrhoid, no fissure, no mass, no tenderness and anal tone normal. Prostate is enlarged (indrated enlarged R lobe of prostate). Prostate is not tender.  Genitourinary Comments: No stool in rectal vault  Musculoskeletal: He exhibits no edema.  Skin: Skin is warm and dry. No rash noted.  Psychiatric: He has a normal mood and affect.  Nursing note and vitals reviewed.   Results for orders placed or performed in visit on 07/16/16  CBC with Differential/Platelet  Result Value Ref Range   WBC 12.5 (H) 4.0 - 10.5 K/uL   RBC 5.23 4.22 - 5.81 Mil/uL   Hemoglobin 14.5 13.0 - 17.0 g/dL   HCT 43.9 39.0 - 52.0 %   MCV 84.0 78.0 - 100.0 fl   MCHC 33.1 30.0 - 36.0 g/dL   RDW 15.5 11.5 - 15.5 %   Platelets 344.0 150.0 - 400.0  K/uL   Neutrophils Relative % 75.6 43.0 - 77.0 %   Lymphocytes Relative 15.7 12.0 - 46.0 %   Monocytes Relative 6.7 3.0 - 12.0 %   Eosinophils Relative 1.6 0.0 - 5.0 %   Basophils Relative 0.4 0.0 - 3.0 %   Neutro Abs 9.5 (H) 1.4 - 7.7 K/uL   Lymphs Abs 2.0 0.7 - 4.0 K/uL   Monocytes Absolute 0.8 0.1 - 1.0 K/uL   Eosinophils Absolute 0.2 0.0 - 0.7 K/uL   Basophils Absolute 0.1 0.0 - 0.1 K/uL  Hemoglobin A1c  Result Value Ref Range   Hgb A1c MFr Bld 8.0 (H) 4.6 - 6.5 %  PSA  Result Value Ref Range   PSA 11.51 (H) 0.10 - 4.00 ng/mL   EXAM: CT Chest, Abdomen & Pelvis  without contrast  DATE: 07/15/2016 5:22 AM ACCESSION: [id number] DICTATED: 07/15/2016 5:29 AM INTERPRETATION LOCATION: [place] CLINICAL INDICATION: 70 years old Male with OTHER-evaluate mesenteric stranding around SMA-  COMPARISON: CTA abdomen pelvis dated 07/14/2016 TECHNIQUE: A spiral CT scan was obtained without IV contrast from the thoracic inlet through the pubic symphysis. Images were reconstructed in the axial plane. Coronal and sagittal reformatted images of were also provided for further evaluation. FINDINGS:  Evaluation of the solid organs and vasculature is limited in the absence of intravenous contrast.  LOWER CHEST: Peripheral, wedge-shaped consolidative opacity in the right middle lobe. ABDOMEN: HEPATOBILIARY: Unremarkable liver. No biliary ductal dilatation. Gallbladder is surgically absent. PANCREAS: Inflammatory changes adjacent to the inferior aspect of the pancreas is likely secondary to stranding about the proximal SMA, as below. SPLEEN: Unremarkable. ADRENAL GLANDS: Unremarkable. KIDNEYS/URETERS: Parenchymal thinning bilaterally. No hydronephrosis. Vascular calcifications within the kidneys. BOWEL/PERITONEUM/RETROPERITONEUM: Small hiatal hernia. Normal appendix. Colonic diverticulosis. No bowel obstruction. No acute inflammatory process. No free fluid or free air. VASCULATURE: Extensive inflammatory  stranding about the proximal SMA, similar to yesterday's exam. Juxtarenal abdominal aortic aneurysm measures up to 6.3 cm just proximal to the endograft, similar when compared to yesterday. Infrarenal aortobiiliac endograft in place which extends inferiorly to level of the common iliac arteries. LYMPH NODES: No adenopathy. REPRODUCTIVE: Prosthetic calcifications. BONES/SOFT TISSUES: No acute osseous abnormalities. Large right and medium left fat-containing hernias. IMPRESSION: -Similar appearance of extensive inflammatory stranding about the SMA. Findings may relate to aneurysm degeneration and potential rupture or vasculitis. 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 -Peripheral consolidative opacity in the right middle lobe is favored to represent atelectasis, although infarction or infection cannot is similar appearance. The findings of this study were discussed via telephone with Dr. Ala Dach by Dr. Lutricia Feil at 07/15/2016 5:39 AM.  ATTENDING ADDENDUM: Agree with above findings. Additional findings include - Mild hepatic steatosis.   Lab Results  Component Value Date   HGBA1C 8.0 (H) 07/16/2016       Assessment & Plan:   Problem List Items Addressed This Visit    AAA (abdominal aortic aneurysm) without rupture ([place])    S/p stenting 2012, pending re-stent due to residual enlarging aneurysm - see below.       Enlarged prostate    New. Unilateral enlargement of right prostate lobe without urinary symptoms.  Check PSA today. If elevated, will need to discuss urology referral - likely when abd pain has improved. Lab Results  Component Value Date   PSA 11.51 (H) 07/16/2016   PSA 2.23 03/21/2010        Relevant Orders   PSA (Completed)   Generalized abdominal pain - Primary    Reviewed all records available.  Recent CT scans  concerning for SMA inflammatory stranding - vasculitis vs progressed aneurysm. I tried to touch base with Dr Eddie Dibbles team, unavailable. Was able to touch base his RN - who did review D/C summary with me which verified abd pain was not thought related to aneurysm. Unclear etiology - ?constipation vs gastroparesis. Rectal exam without signs of impaction. Recheck labs today including LA to r/o mesenteric ischemia. I did recommend bland diet x next 5 days as well as starting miralax regularly. Provided with #20 percocet for acute pain. Update over next few days.      Relevant Orders   CBC with Differential/Platelet (Completed)   Lactic Acid, Plasma  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HCC)    Enlarging - pending surgery 08/06/2016 by Dr Sammuel Hines at [facility]. Appreciate [facility] care.       Tobacco abuse    Encouraged cessation.      Type 2 diabetes, uncontrolled, with neuropathy (HCC)    Check A1c. ?gastroparesis      Relevant Orders   Hemoglobin A1c (Completed)    Other Visit Diagnoses   None.      Follow up plan: Return if symptoms worsen or fail to improve.  Ria Bush, MD

## 2016-07-16 NOTE — Progress Notes (Signed)
Pre visit review using our clinic review tool, if applicable. No additional management support is needed unless otherwise documented below in the visit note. 

## 2016-07-16 NOTE — Assessment & Plan Note (Signed)
Encouraged cessation.

## 2016-07-16 NOTE — Assessment & Plan Note (Addendum)
Enlarging - pending surgery 08/06/2016 by Dr Sammuel Hines at [facility]. Appreciate [facility] care.

## 2016-07-16 NOTE — Patient Instructions (Addendum)
Labs today.  Start miralax 1 capful daily to twice daily.  Possible gastroparesis or bad constipation.  Bland diet over next several days until feeling better.  Update me with how you're doing over the next 2 days

## 2016-07-16 NOTE — Assessment & Plan Note (Signed)
S/p stenting 2012, pending re-stent due to residual enlarging aneurysm - see below.

## 2016-07-16 NOTE — Assessment & Plan Note (Addendum)
New. Unilateral enlargement of right prostate lobe without urinary symptoms.  Check PSA today. If elevated, will need to discuss urology referral - likely when abd pain has improved. Lab Results  Component Value Date   PSA 11.51 (H) 07/16/2016   PSA 2.23 03/21/2010

## 2016-07-16 NOTE — Assessment & Plan Note (Signed)
Reviewed all records available.  Recent CT scans concerning for SMA inflammatory stranding - vasculitis vs progressed aneurysm. I tried to touch base with Dr Eddie Dibbles team, unavailable. Was able to touch base his RN - who did review D/C summary with me which verified abd pain was not thought related to aneurysm. Unclear etiology - ?constipation vs gastroparesis. Rectal exam without signs of impaction. Recheck labs today including LA to r/o mesenteric ischemia. I did recommend bland diet x next 5 days as well as starting miralax regularly. Provided with #20 percocet for acute pain. Update over next few days.

## 2016-07-16 NOTE — Assessment & Plan Note (Signed)
Check A1c. ?gastroparesis

## 2016-07-17 ENCOUNTER — Ambulatory Visit: Payer: Medicare Other | Admitting: Family Medicine

## 2016-07-18 ENCOUNTER — Encounter: Payer: Self-pay | Admitting: Family Medicine

## 2016-07-18 ENCOUNTER — Ambulatory Visit (INDEPENDENT_AMBULATORY_CARE_PROVIDER_SITE_OTHER): Payer: Medicare Other | Admitting: Family Medicine

## 2016-07-18 VITALS — BP 126/74 | HR 68 | Temp 97.9°F | Wt 228.8 lb

## 2016-07-18 DIAGNOSIS — M545 Low back pain: Secondary | ICD-10-CM | POA: Diagnosis not present

## 2016-07-18 DIAGNOSIS — R1084 Generalized abdominal pain: Secondary | ICD-10-CM | POA: Diagnosis not present

## 2016-07-18 DIAGNOSIS — Z7901 Long term (current) use of anticoagulants: Secondary | ICD-10-CM | POA: Diagnosis not present

## 2016-07-18 DIAGNOSIS — I714 Abdominal aortic aneurysm, without rupture, unspecified: Secondary | ICD-10-CM

## 2016-07-18 DIAGNOSIS — N400 Enlarged prostate without lower urinary tract symptoms: Secondary | ICD-10-CM

## 2016-07-18 DIAGNOSIS — I7141 Pararenal abdominal aortic aneurysm, without rupture: Secondary | ICD-10-CM

## 2016-07-18 LAB — POC URINALSYSI DIPSTICK (AUTOMATED)
Blood, UA: NEGATIVE
GLUCOSE UA: NEGATIVE
LEUKOCYTES UA: NEGATIVE
NITRITE UA: NEGATIVE
PH UA: 5.5
Spec Grav, UA: >=1.03
Urobilinogen, UA: 1

## 2016-07-18 LAB — LACTIC ACID, PLASMA: LACTIC ACID: 13 mg/dL (ref 4–16)

## 2016-07-18 MED ORDER — SULFAMETHOXAZOLE-TRIMETHOPRIM 800-160 MG PO TABS: 1.0000 | ORAL_TABLET | Freq: Two times a day (BID) | ORAL | 0 refills | Status: DC

## 2016-07-18 NOTE — Progress Notes (Signed)
BP 126/74   Pulse 68   Temp 97.9 F (36.6 C) (Oral)   Wt 228 lb 12 oz (103.8 kg)   BMI 30.18 kg/m    CC: f/u visit Subjective:    Patient ID: [name], male    DOB: 1946-07-21, 70 y.o.   MRN: [id number]  HPI: [name] is a 70 y.o. male presenting on 07/18/2016 for Follow-up (LBP; weakness)   See prior note for details. Recent hospitalization at [facility] for abdominal pain, initially concerned due to progression of TA aortic aneurysm vs vasculitis of SMA. However on further imaging aneurysm was found to be stable and he was discharged with dx non-specific abdominal pain. Patient and VVS decided to continue with previously scheduled surgery for 8/30.   Last visit we started miralax 1 capful twice daily for constipation vs nonspecific enteritis. Rectal exam showed no formed stool in vault but did show enlarged and indurated right prostatic lobe. PSA returned elevated at 11.9, white count was mildly elevated at 12.5. Lactic acid still pending (obtained to r/o mesenteric ischemia - although no significant pain post meals). We also prescribed percocet #20 for acute pain.  Recommended bland diet - he ate fried egg sandwich. Recommended rest - he endorses weakness worse after mowing lawn for 3 hrs yesterday morning.   Pt remains worried he has infection due to nonspecific lower back pain, denies urinary symptoms, fevers, vomiting. He did have several small dark firm bowel movements, but more recently stools have changed to normal brown color and he has started feeling better.  Recent labs reviewed with patient.   Relevant past medical, surgical, family and social history reviewed and updated as indicated. Interim medical history since our last visit reviewed. Allergies and medications reviewed and updated. Current Outpatient Prescriptions on File Prior to Visit  Medication Sig  . amiodarone (PACERONE) 100 MG tablet Take 100 mg by mouth daily.  . budesonide-formoterol (SYMBICORT)  160-4.5 MCG/ACT inhaler Inhale 1 puff into the lungs 2 (two) times daily.  . Coenzyme Q10 100 MG capsule Take 1 capsule (100 mg total) by mouth daily.  . diphenoxylate-atropine (LOMOTIL) 2.5-0.025 MG tablet Take 1 tablet by mouth 2 (two) times daily as needed for diarrhea or loose stools.  Marland Kitchen doxycycline (VIBRA-TABS) 100 MG tablet Take 1 tablet (100 mg total) by mouth 2 (two) times daily.  . fish oil-omega-3 fatty acids 1000 MG capsule Take 2 g by mouth daily.   . fluticasone furoate-vilanterol (BREO ELLIPTA) 200-25 MCG/INH AEPB Inhale 1 puff into the lungs daily.  . furosemide (LASIX) 20 MG tablet TAKE 1 TABLET BY MOUTH AS NEEDED FOR FLUID  . gabapentin (NEURONTIN) 600 MG tablet TAKE 1 TABLET BY MOUTH THREE TIMES DAILY  . glucose blood (ONE TOUCH ULTRA TEST) test strip Check blood sugar once daily and as needed. 250.60  . Glucose Blood (ONETOUCH ULTRA BLUE VI) by In Vitro route. Test blood glucose 1 time per day and as directed.   Marland Kitchen HYDROcodone-acetaminophen (NORCO) 10-325 MG tablet Take 1 tablet by mouth every 8 (eight) hours as needed.  . metFORMIN (GLUCOPHAGE) 500 MG tablet TAKE 1 TABLET BY MOUTH EVERY MORNING AND 2 TABLETS EVERY EVENING  . nitroGLYCERIN (NITROLINGUAL) 0.4 MG/SPRAY spray Place 1 spray under the tongue every 5 (five) minutes as needed.  Marland Kitchen omeprazole (PRILOSEC) 40 MG capsule TAKE 1 CAPSULE BY MOUTH EVERY DAY  . oxyCODONE-acetaminophen (ROXICET) 5-325 MG tablet Take 1 tablet by mouth every 8 (eight) hours as needed for severe pain.  Marland Kitchen  potassium chloride (K-DUR,KLOR-CON) 10 MEQ tablet Take 10 mEq by mouth daily. If lasix taken  . PROAIR HFA 108 (90 Base) MCG/ACT inhaler INHALE 2 PUFFS BY MOUTH EVERY 6 HOURS AS NEEDED FOR WHEEZING OR SHORTNESS OF BREATH  . sitaGLIPtin (JANUVIA) 50 MG tablet Take 1 tablet (50 mg total) by mouth daily.  Marland Kitchen tiZANidine (ZANAFLEX) 4 MG tablet TAKE 1 TABLET BY MOUTH TWICE DAILY AS NEEDED FOR MUSCLE SPASMS  . warfarin (COUMADIN) 5 MG tablet TAKE AS DIRECTED  BY COUMADIN CLINIC   No current facility-administered medications on file prior to visit.     Review of Systems Per HPI unless specifically indicated in ROS section     Objective:    BP 126/74   Pulse 68   Temp 97.9 F (36.6 C) (Oral)   Wt 228 lb 12 oz (103.8 kg)   BMI 30.18 kg/m   Wt Readings from Last 3 Encounters:  07/18/16 228 lb 12 oz (103.8 kg)  07/16/16 227 lb (103 kg)  06/30/16 236 lb 4 oz (107.2 kg)    Physical Exam  Constitutional: He appears well-developed and well-nourished. No distress.  Psychiatric: He has a normal mood and affect.  Nursing note and vitals reviewed.  Results for orders placed or performed in visit on 07/18/16  POCT Urinalysis Dipstick (Automated)  Result Value Ref Range   Color, UA Amber    Clarity, UA Clear    Glucose, UA Negative    Bilirubin, UA 1+    Ketones, UA Trace    Spec Grav, UA >=1.030    Blood, UA Negative    pH, UA 5.5    Protein, UA 1+    Urobilinogen, UA 1.0    Nitrite, UA Negative    Leukocytes, UA Negative Negative   Lab Results  Component Value Date   CREATININE 0.77 07/14/2016    Lab Results  Component Value Date   INR 2.88 07/14/2016   INR 2.0 07/02/2016   INR 2.5 05/14/2016    Lab Results  Component Value Date   WBC 12.5 (H) 07/16/2016   HGB 14.5 07/16/2016   HCT 43.9 07/16/2016   MCV 84.0 07/16/2016   PLT 344.0 07/16/2016       Assessment & Plan:   Problem List Items Addressed This Visit    AAA (abdominal aortic aneurysm) without rupture ([place])    Recent [facility] discharge summary reviewed. Abd/back pain thought not related to known aneurysm. Will continue with planned vascular intervention 08/06/2016      Current use of long term anticoagulation    Will cc coumadin clinic due to starting cipro. No changes to coumadin dosing made today.  Next appt with coumadin clinic is 07/30/2016.      Enlarged prostate    Enlarged indurated prostate with PSA 11 - discussed concerns including possible cancer.    Given endorsing lower back pain as well as mild leukocytosis, will cover for prostate infection with bactrim 2 wk course. cipro avoided due to more drug interactions.  Discussed prostate infection would not likely cause abnormalities felt on DRE earlier this week and that I recommend urology referral after he's undergone AAA procedure at [facility].       Generalized abdominal pain - Primary    Recent D/C summary reviewed. abd pain possibly related to constipation - responding to miralax. Will need to monitor for development of diabetic gastroparesis. Discussed importance of hydration status and bowel rest and general rest. Continue to monitor. Pt requests return prior to upcoming  AAA procedure for recheck with me.       Low back pain    ?prostatitis related - see above. Treat with bactrim DS BID course x 2 wks, will route note to coumadin clinic. I asked patient to contact coumadin clinic today for instructions on coumadin dosing while on cipro.       Relevant Orders   POCT Urinalysis Dipstick (Automated) (Completed)   Suprarenal aortic aneurysm [facility])    Pending surgery 08/06/2016. Appreciate Dr Eddie Dibbles care of patient.        Other Visit Diagnoses   None.      Follow up plan: Return in about 2 weeks (around 08/01/2016) for follow up visit.  Ria Bush, MD

## 2016-07-18 NOTE — Patient Instructions (Addendum)
Return for recheck in 2 weeks prior to upcoming surgery.  Prostate exam was abnormal on Wednesday and PSA was high - we will refer you to urologist after your aneurysm surgery.  For possible prostate infection - treat with bactrim twice daily for 2 weeks. Call coumadin clinic for coumadin dosing as bactrim will thin your blood. Continue bland diet over next few days to give bowels a rest and time to fully heal.  Continue miralax for goal 1 soft stool a day. Urinalysis today.

## 2016-07-18 NOTE — Progress Notes (Signed)
Pre visit review using our clinic review tool, if applicable. No additional management support is needed unless otherwise documented below in the visit note. 

## 2016-07-20 ENCOUNTER — Encounter: Payer: Self-pay | Admitting: Family Medicine

## 2016-07-20 NOTE — Assessment & Plan Note (Signed)
Pending surgery 08/06/2016. Appreciate Dr Eddie Dibbles care of patient.

## 2016-07-20 NOTE — Assessment & Plan Note (Addendum)
?  prostatitis related - see above. Treat with bactrim DS BID course x 2 wks, will route note to coumadin clinic. I asked patient to contact coumadin clinic today for instructions on coumadin dosing while on cipro.

## 2016-07-20 NOTE — Assessment & Plan Note (Addendum)
Recent D/C summary reviewed. abd pain possibly related to constipation - responding to miralax. Will need to monitor for development of diabetic gastroparesis. Discussed importance of hydration status and bowel rest and general rest. Continue to monitor. Pt requests return prior to upcoming AAA procedure for recheck with me.

## 2016-07-20 NOTE — Assessment & Plan Note (Addendum)
Enlarged indurated prostate with PSA 11 - discussed concerns including possible cancer.  Given endorsing lower back pain as well as mild leukocytosis, will cover for prostate infection with bactrim 2 wk course. cipro avoided due to more drug interactions.  Discussed prostate infection would not likely cause abnormalities felt on DRE earlier this week and that I recommend urology referral after he's undergone AAA procedure at [facility].

## 2016-07-20 NOTE — Assessment & Plan Note (Signed)
Recent [facility] discharge summary reviewed. Abd/back pain thought not related to known aneurysm. Will continue with planned vascular intervention 08/06/2016

## 2016-07-20 NOTE — Assessment & Plan Note (Addendum)
Will cc coumadin clinic due to starting cipro. No changes to coumadin dosing made today.  Next appt with coumadin clinic is 07/30/2016.

## 2016-07-22 ENCOUNTER — Telehealth: Payer: Self-pay | Admitting: Cardiovascular Disease

## 2016-07-22 ENCOUNTER — Telehealth: Payer: Self-pay

## 2016-07-22 NOTE — Telephone Encounter (Signed)
Pt wife called, states pt started an antibiotic on Friday 8/11. Please advise of coumadin doseage. Pt is scheduled for check on 8/18.

## 2016-07-22 NOTE — Telephone Encounter (Signed)
Called spoke with pt, advised we need to check INR secondary to starting on Bactrim DS on 07/18/16.  Pt states he is out of town on a run to [place], and will not return until Friday 07/25/16.  Pt made aware abx can interact with Coumadin and cause his INR to be elevated.  Pt aware to monitor for signs and symptoms of bleeding and go to ED with problems.  Pt states he will come into [place] office for INR check on Friday when he gets back in town.

## 2016-07-22 NOTE — Telephone Encounter (Signed)
Returned call to pt's wife advised I spoke with pt and made appt for 07/25/16 to check pt's INR secondary to start of Bactrim DS on 07/18/16.  Advised we will not know how much to adjust pt's dosage until we check an INR.  Pt's wife assured me pt will have INR checked as soon as he returns to town on 07/25/16.  Will await results.

## 2016-07-23 ENCOUNTER — Telehealth: Payer: Self-pay

## 2016-07-23 DIAGNOSIS — R972 Elevated prostate specific antigen [PSA]: Secondary | ICD-10-CM

## 2016-07-23 DIAGNOSIS — N400 Enlarged prostate without lower urinary tract symptoms: Secondary | ICD-10-CM

## 2016-07-23 NOTE — Telephone Encounter (Signed)
[name] left v/m; pt is not having his surgery until 09/15/2016; last visit was mentioned about pt seeing urologist due to prostate issue; request urology referral done and would like to see urologist the early part of September. [name] request cb.

## 2016-07-24 ENCOUNTER — Telehealth: Payer: Self-pay | Admitting: Cardiovascular Disease

## 2016-07-24 NOTE — Telephone Encounter (Signed)
Urology referral placed

## 2016-07-24 NOTE — Telephone Encounter (Signed)
Left message for pt to call back  °

## 2016-07-24 NOTE — Telephone Encounter (Signed)
Pt c/o BP issue: STAT if pt c/o blurred vision, one-sided weakness or slurred speech  1. What are your last 5 BP readings?  07/23/16  Evening 164/75 HR 55  (this was taken an hour after the first one) 131/72 HR 54  07/22/16  Mid day 155/85 HR 51  07/21/16 Evening 150/71 HR 55 07/20/16 Evening 142/78 HR 55 07/19/16 160/75 HR 55   2. Are you having any other symptoms (ex. Dizziness, headache, blurred vision, passed out)?  No   3. What is your BP issue?  Going up and down, pt wife states its going all over the place

## 2016-07-24 NOTE — Telephone Encounter (Signed)
Spoke w/ pt's wife.  She reports that pt was out of town on 8/4, got food from [employer] drive thru and got sick. She went to [facility] to pick him up on 8/6 b/c he was feeling so bad. She brought him to [facility] ED, he was transferred to [facility] ICU 2/2 concerns about AAA.  He is sched for vascular intervention on 08/06/16 @ [facility]. She reports that pt had u/s and AAA has not changed. He was ultimately diagnosed w/ "a really bad case of food poisoning" and placed on Bactrim by Dr. Danise Mina on 07/18/16.  He has an appt w/ urology next month as his PSA was elevated at 11.51.  He is feeling better, but his BP has been elevated.   It was elevated in the hospital, but she attributed this to pt being aggravated.   She would like to know if Dr. Rockey Situ has any recommendations re pt's BP.   Pt will be here tomorrow am for coumadin check.

## 2016-07-25 ENCOUNTER — Telehealth: Payer: Self-pay | Admitting: Cardiovascular Disease

## 2016-07-25 ENCOUNTER — Ambulatory Visit (INDEPENDENT_AMBULATORY_CARE_PROVIDER_SITE_OTHER): Payer: Medicare Other | Admitting: Pharmacist

## 2016-07-25 DIAGNOSIS — I4891 Unspecified atrial fibrillation: Secondary | ICD-10-CM | POA: Diagnosis not present

## 2016-07-25 LAB — POCT INR: INR: 4

## 2016-07-25 NOTE — Telephone Encounter (Signed)
Would continue to monitor pressures for now Blood pressure can be elevated in the setting of pain, abdominal symptoms, agitation I have looked at every blood pressure from office visits with every Dr. through the course of the past year, Highest number was 140 I hesitate to start a medication as it may drop the blood pressure too low Would continue to monitor blood pressure when he is not agitated and not in pain Keep a track of numbers and Call with more numbers

## 2016-07-25 NOTE — Telephone Encounter (Signed)
Spoke w/ pt during his coumadin visit.  Advised him of Dr. Donivan Scull recommendation.  He verbalizes understanding and is agreeable to continuing to monitor BP, though I did ask him to reduce the frequency of his BP checks to BID, as he has been checking 5-6 times a day. He is going out of town to [place] this week, will return in time for his surgery on 8/30. Asked him to call back if we can be of further assistance.

## 2016-07-25 NOTE — Telephone Encounter (Signed)
Please see pt's coumadin visit for today. They can access this through [place].

## 2016-07-25 NOTE — Telephone Encounter (Signed)
PA with [facility] states pt is having surgery on 8/30 and needs advice on pt stopping coumadin. Please call and advise.

## 2016-08-01 ENCOUNTER — Other Ambulatory Visit: Payer: Self-pay | Admitting: Family Medicine

## 2016-08-01 ENCOUNTER — Ambulatory Visit: Payer: Medicare Other | Admitting: Family Medicine

## 2016-08-01 ENCOUNTER — Other Ambulatory Visit: Payer: Self-pay | Admitting: Cardiovascular Disease

## 2016-08-01 MED ORDER — HYDROCODONE-ACETAMINOPHEN 10-325 MG PO TABS: 1.0000 | ORAL_TABLET | Freq: Three times a day (TID) | ORAL | 0 refills | Status: DC | PRN

## 2016-08-01 NOTE — Telephone Encounter (Signed)
[name] (DPR signed) left v/m requesting rx hydrocodone apap. Call when ready for pick up. Last printed # 60 on 06/30/16. Last seen 07/18/16.

## 2016-08-01 NOTE — Telephone Encounter (Signed)
Patient's wife notified and Rx placed up front for pick up. 

## 2016-08-01 NOTE — Telephone Encounter (Signed)
Printed and in Kim's box 

## 2016-08-03 ENCOUNTER — Other Ambulatory Visit: Payer: Self-pay | Admitting: Family Medicine

## 2016-08-04 NOTE — Telephone Encounter (Signed)
Last refill 03/09/16 #60/3 Last office visit 07/18/16

## 2016-08-08 ENCOUNTER — Other Ambulatory Visit: Payer: Self-pay

## 2016-08-12 ENCOUNTER — Other Ambulatory Visit: Payer: Self-pay | Admitting: Family Medicine

## 2016-08-13 ENCOUNTER — Ambulatory Visit (INDEPENDENT_AMBULATORY_CARE_PROVIDER_SITE_OTHER): Payer: Medicare Other

## 2016-08-13 DIAGNOSIS — I4891 Unspecified atrial fibrillation: Secondary | ICD-10-CM

## 2016-08-13 LAB — POCT INR: INR: 2.1

## 2016-08-14 DIAGNOSIS — R972 Elevated prostate specific antigen [PSA]: Secondary | ICD-10-CM | POA: Diagnosis not present

## 2016-08-26 ENCOUNTER — Telehealth: Payer: Self-pay | Admitting: Cardiovascular Disease

## 2016-08-26 ENCOUNTER — Other Ambulatory Visit: Payer: Self-pay | Admitting: Family Medicine

## 2016-08-26 NOTE — Telephone Encounter (Signed)
Last filled 08-03-16 #90 Last OV 07-16-16 No Future OV Cancelled 08-01-16 OV

## 2016-08-26 NOTE — Telephone Encounter (Signed)
Pt is having surgery on 10/2 for aneurysm repair and needs clearance . Pt is on Warfarin. Please call.

## 2016-08-26 NOTE — Telephone Encounter (Signed)
Per [facility]:  "[name] is to undergo FEVAR with Dr. Sammuel Hines in [place]. He does not need a lumbar drain. He will be admitted the night before for labs and pre-hydration."  They are requesting cardiac clearance and instructions for holding coumadin prior to procedure.

## 2016-08-27 NOTE — Telephone Encounter (Signed)
Acceptable risk to stop warfarin Moderate risk for procedure given poor control diabetes, known PAD, high risk of coronary disease No further testing needed

## 2016-08-28 NOTE — Telephone Encounter (Signed)
Reviewed Dr. Gwenyth Ober recommendations and she has no further questions at this time. Routed message to her through [place]. Let her know to please call if she needs any additional assistance.

## 2016-08-29 ENCOUNTER — Other Ambulatory Visit: Payer: Self-pay | Admitting: Cardiovascular Disease

## 2016-09-01 ENCOUNTER — Other Ambulatory Visit: Payer: Self-pay | Admitting: *Deleted

## 2016-09-01 ENCOUNTER — Telehealth: Payer: Self-pay | Admitting: Family Medicine

## 2016-09-01 ENCOUNTER — Telehealth: Payer: Self-pay | Admitting: Cardiovascular Disease

## 2016-09-01 MED ORDER — HYDROCODONE-ACETAMINOPHEN 10-325 MG PO TABS: 1.0000 | ORAL_TABLET | Freq: Three times a day (TID) | ORAL | 0 refills | Status: DC | PRN

## 2016-09-01 NOTE — Telephone Encounter (Signed)
Spoke with spouse informed her that Dr Rockey Situ has approved him to hold coumadin , but she needs to check with [facility] to see how many days they want him off coumadin.

## 2016-09-01 NOTE — Telephone Encounter (Signed)
Pt wife called, states pt is having abdominal aortic repair surgery on 10/2. She asks if he should hold his coumadin. Please advise. DR. Vinnie Level at [facility].

## 2016-09-01 NOTE — Telephone Encounter (Signed)
Printed and in [place].

## 2016-09-01 NOTE — Telephone Encounter (Signed)
Patient's wife left a voicemail requesting a refill on Hydrocodone Last refill 08/01/16 #60 Last office visit 07/18/16 Call when ready for pickup

## 2016-09-01 NOTE — Telephone Encounter (Signed)
LM w/wife re: pnuemonia/flu shot, wife says pt will refuse due to past bad reaction, mn

## 2016-09-02 NOTE — Telephone Encounter (Signed)
Message left advising patient and Rx placed up front for pick up. 

## 2016-09-04 DIAGNOSIS — I716 Thoracoabdominal aortic aneurysm, without rupture: Secondary | ICD-10-CM | POA: Diagnosis not present

## 2016-09-04 DIAGNOSIS — J449 Chronic obstructive pulmonary disease, unspecified: Secondary | ICD-10-CM | POA: Diagnosis not present

## 2016-09-05 DIAGNOSIS — I716 Thoracoabdominal aortic aneurysm, without rupture: Secondary | ICD-10-CM | POA: Diagnosis not present

## 2016-09-05 DIAGNOSIS — Z006 Encounter for examination for normal comparison and control in clinical research program: Secondary | ICD-10-CM | POA: Diagnosis not present

## 2016-09-07 DIAGNOSIS — I716 Thoracoabdominal aortic aneurysm, without rupture: Secondary | ICD-10-CM | POA: Diagnosis present

## 2016-09-07 DIAGNOSIS — K219 Gastro-esophageal reflux disease without esophagitis: Secondary | ICD-10-CM | POA: Diagnosis present

## 2016-09-07 DIAGNOSIS — Z9049 Acquired absence of other specified parts of digestive tract: Secondary | ICD-10-CM | POA: Diagnosis not present

## 2016-09-07 DIAGNOSIS — I4891 Unspecified atrial fibrillation: Secondary | ICD-10-CM | POA: Diagnosis present

## 2016-09-07 DIAGNOSIS — F17200 Nicotine dependence, unspecified, uncomplicated: Secondary | ICD-10-CM | POA: Diagnosis present

## 2016-09-07 DIAGNOSIS — M545 Low back pain: Secondary | ICD-10-CM | POA: Diagnosis present

## 2016-09-07 DIAGNOSIS — Z6834 Body mass index (BMI) 34.0-34.9, adult: Secondary | ICD-10-CM | POA: Diagnosis not present

## 2016-09-07 DIAGNOSIS — G8929 Other chronic pain: Secondary | ICD-10-CM | POA: Diagnosis present

## 2016-09-07 DIAGNOSIS — I110 Hypertensive heart disease with heart failure: Secondary | ICD-10-CM | POA: Diagnosis present

## 2016-09-07 DIAGNOSIS — Z7951 Long term (current) use of inhaled steroids: Secondary | ICD-10-CM | POA: Diagnosis not present

## 2016-09-07 DIAGNOSIS — Z7984 Long term (current) use of oral hypoglycemic drugs: Secondary | ICD-10-CM | POA: Diagnosis not present

## 2016-09-07 DIAGNOSIS — M5030 Other cervical disc degeneration, unspecified cervical region: Secondary | ICD-10-CM | POA: Diagnosis present

## 2016-09-07 DIAGNOSIS — R7303 Prediabetes: Secondary | ICD-10-CM | POA: Diagnosis present

## 2016-09-07 DIAGNOSIS — J9811 Atelectasis: Secondary | ICD-10-CM | POA: Diagnosis not present

## 2016-09-07 DIAGNOSIS — G4733 Obstructive sleep apnea (adult) (pediatric): Secondary | ICD-10-CM | POA: Diagnosis present

## 2016-09-07 DIAGNOSIS — Z79899 Other long term (current) drug therapy: Secondary | ICD-10-CM | POA: Diagnosis not present

## 2016-09-07 DIAGNOSIS — J811 Chronic pulmonary edema: Secondary | ICD-10-CM | POA: Diagnosis not present

## 2016-09-07 DIAGNOSIS — E7800 Pure hypercholesterolemia, unspecified: Secondary | ICD-10-CM | POA: Diagnosis present

## 2016-09-07 DIAGNOSIS — J449 Chronic obstructive pulmonary disease, unspecified: Secondary | ICD-10-CM | POA: Diagnosis present

## 2016-09-07 DIAGNOSIS — Z006 Encounter for examination for normal comparison and control in clinical research program: Secondary | ICD-10-CM | POA: Diagnosis not present

## 2016-09-07 DIAGNOSIS — R001 Bradycardia, unspecified: Secondary | ICD-10-CM | POA: Diagnosis not present

## 2016-09-07 DIAGNOSIS — E669 Obesity, unspecified: Secondary | ICD-10-CM | POA: Diagnosis present

## 2016-09-07 DIAGNOSIS — Z7901 Long term (current) use of anticoagulants: Secondary | ICD-10-CM | POA: Diagnosis not present

## 2016-09-07 DIAGNOSIS — Z01818 Encounter for other preprocedural examination: Secondary | ICD-10-CM | POA: Diagnosis not present

## 2016-09-07 DIAGNOSIS — Z48812 Encounter for surgical aftercare following surgery on the circulatory system: Secondary | ICD-10-CM | POA: Diagnosis not present

## 2016-09-07 DIAGNOSIS — N390 Urinary tract infection, site not specified: Secondary | ICD-10-CM | POA: Diagnosis present

## 2016-09-07 DIAGNOSIS — I5032 Chronic diastolic (congestive) heart failure: Secondary | ICD-10-CM | POA: Diagnosis present

## 2016-09-07 DIAGNOSIS — Z95828 Presence of other vascular implants and grafts: Secondary | ICD-10-CM | POA: Diagnosis not present

## 2016-09-09 ENCOUNTER — Other Ambulatory Visit: Payer: Self-pay | Admitting: Family Medicine

## 2016-09-12 ENCOUNTER — Telehealth: Payer: Self-pay | Admitting: *Deleted

## 2016-09-12 ENCOUNTER — Encounter: Payer: Self-pay | Admitting: Family Medicine

## 2016-09-12 ENCOUNTER — Ambulatory Visit (INDEPENDENT_AMBULATORY_CARE_PROVIDER_SITE_OTHER): Payer: Medicare Other | Admitting: Family Medicine

## 2016-09-12 VITALS — BP 128/78 | HR 72 | Temp 97.4°F | Wt 225.5 lb

## 2016-09-12 DIAGNOSIS — R252 Cramp and spasm: Secondary | ICD-10-CM

## 2016-09-12 DIAGNOSIS — D72828 Other elevated white blood cell count: Secondary | ICD-10-CM

## 2016-09-12 DIAGNOSIS — I714 Abdominal aortic aneurysm, without rupture, unspecified: Secondary | ICD-10-CM

## 2016-09-12 DIAGNOSIS — R3129 Other microscopic hematuria: Secondary | ICD-10-CM

## 2016-09-12 LAB — POC URINALSYSI DIPSTICK (AUTOMATED)
BILIRUBIN UA: NEGATIVE
GLUCOSE UA: NEGATIVE
LEUKOCYTES UA: NEGATIVE
NITRITE UA: NEGATIVE
Spec Grav, UA: >=1.03
Urobilinogen, UA: 0.2
pH, UA: 5.5

## 2016-09-12 LAB — BASIC METABOLIC PANEL
BUN: 18 mg/dL (ref 6–23)
CHLORIDE: 98 meq/L (ref 96–112)
CO2: 25 meq/L (ref 19–32)
Calcium: 9.3 mg/dL (ref 8.4–10.5)
Creatinine, Ser: 1.29 mg/dL (ref 0.40–1.50)
GFR: 58.13 mL/min — ABNORMAL LOW (ref >60.00)
Glucose, Bld: 178 mg/dL — ABNORMAL HIGH (ref 70–99)
POTASSIUM: 4.4 meq/L (ref 3.5–5.1)
Sodium: 134 mEq/L — ABNORMAL LOW (ref 135–145)

## 2016-09-12 LAB — CBC WITH DIFFERENTIAL/PLATELET
Basophils Absolute: 0.1 10*3/uL (ref 0.0–0.1)
Basophils Relative: 0.4 % (ref 0.0–3.0)
EOS ABS: 0.1 10*3/uL (ref 0.0–0.7)
Eosinophils Relative: 0.6 % (ref 0.0–5.0)
HCT: 39.4 % (ref 39.0–52.0)
Hemoglobin: 13 g/dL (ref 13.0–17.0)
LYMPHS ABS: 2.4 10*3/uL (ref 0.7–4.0)
Lymphocytes Relative: 10.8 % — ABNORMAL LOW (ref 12.0–46.0)
MCHC: 32.9 g/dL (ref 30.0–36.0)
MCV: 83.9 fl (ref 78.0–100.0)
MONO ABS: 1.6 10*3/uL — AB (ref 0.1–1.0)
Monocytes Relative: 7.5 % (ref 3.0–12.0)
NEUTROS ABS: 17.6 10*3/uL — AB (ref 1.4–7.7)
NEUTROS PCT: 80.7 % — AB (ref 43.0–77.0)
PLATELETS: 284 10*3/uL (ref 150.0–400.0)
RBC: 4.7 Mil/uL (ref 4.22–5.81)
RDW: 15.9 % — ABNORMAL HIGH (ref 11.5–15.5)

## 2016-09-12 LAB — CK: CK TOTAL: 565 U/L — AB (ref 7–232)

## 2016-09-12 LAB — MAGNESIUM: MAGNESIUM: 2.2 mg/dL (ref 1.5–2.5)

## 2016-09-12 NOTE — Telephone Encounter (Signed)
Bobbie from [facility] called WBC=21.9 Dr. Darnell Level notified

## 2016-09-12 NOTE — Progress Notes (Signed)
Pre visit review using our clinic review tool, if applicable. No additional management support is needed unless otherwise documented below in the visit note. 

## 2016-09-12 NOTE — Patient Instructions (Signed)
Labs today. Make sure you drink good water.  Return in 2 weeks for incision check.

## 2016-09-12 NOTE — Addendum Note (Signed)
Addended by: Royann Shivers A on: 09/12/2016 03:29 PM   Modules accepted: Orders

## 2016-09-12 NOTE — Assessment & Plan Note (Signed)
Increased thigh cramping after recent endovascular AAA repair. Check labs today. Pt endorses slowly improving. Anticipate will continue to improve.

## 2016-09-12 NOTE — Assessment & Plan Note (Signed)
S/p fenestrated endovascular AAA repair [id number].  Pt requests they return here 2 wks for incision check, states this was also recommended by [facility].

## 2016-09-12 NOTE — Progress Notes (Addendum)
BP 128/78   Pulse 72   Temp 97.4 F (36.3 C) (Oral)   Wt 225 lb 8 oz (102.3 kg)   BMI 29.75 kg/m    CC: leg cramping Subjective:    Patient ID: [name], male    DOB: 1946/04/23, 70 y.o.   MRN: [id number]  HPI: [name] is a 70 y.o. 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 Notes reviewed in epic.  Main concern today is L medial thigh muscle cramping and pain since surgery. Left knee burning and tender as well. Actually improving some.  No fevers/chills, abdominal pain. Some constipation - taking colace, and will try magnesium citrate. Passing gas ok. zanaflex helps some.   Currently taking oxycodone 10mg  in place of hydrocodone. Taking aspirin 81mg  daily along with coumadin.   Pt states he was told he had infected urine at hospital, requests UA checked today.   Relevant past medical, surgical, family and social history reviewed and updated as indicated. Interim medical history since our last visit reviewed. Allergies and medications reviewed and updated. Current Outpatient Prescriptions on File Prior to Visit  Medication Sig  . amiodarone (PACERONE) 100 MG tablet Take 100 mg by mouth daily.  . Coenzyme Q10 100 MG capsule Take 1 capsule (100 mg total) by mouth daily.  . diphenoxylate-atropine (LOMOTIL) 2.5-0.025 MG tablet Take 1 tablet by mouth 2 (two) times daily as needed for diarrhea or loose stools.  . fish oil-omega-3 fatty acids 1000 MG capsule Take 2 g by mouth daily.   . fluticasone furoate-vilanterol (BREO ELLIPTA) 200-25 MCG/INH AEPB Inhale 1 puff into the lungs daily.  . furosemide (LASIX) 20 MG tablet TAKE 1 TABLET BY MOUTH AS NEEDED FOR FLUID  . gabapentin (NEURONTIN) 600 MG tablet TAKE 1 TABLET BY MOUTH THREE TIMES DAILY  . glucose blood (ONE TOUCH ULTRA TEST) test strip Check blood sugar once daily and as needed. 250.60  . Glucose Blood (ONETOUCH ULTRA BLUE VI) by In  Vitro route. Test blood glucose 1 time per day and as directed.   . metFORMIN (GLUCOPHAGE) 500 MG tablet TAKE 1 TABLET BY MOUTH EVERY MORNING AND 2 TABLETS EVERY EVENING (Patient taking differently: TAKE 2 TABLET BY MOUTH EVERY MORNING AND 2 TABLETS EVERY EVENING)  . nitroGLYCERIN (NITROLINGUAL) 0.4 MG/SPRAY spray Place 1 spray under the tongue every 5 (five) minutes as needed.  Marland Kitchen omeprazole (PRILOSEC) 40 MG capsule TAKE 1 CAPSULE BY MOUTH EVERY DAY  . potassium chloride (K-DUR,KLOR-CON) 10 MEQ tablet TAKE 2 TABLETS BY MOUTH TWICE DAILY  . PROAIR HFA 108 (90 Base) MCG/ACT inhaler INHALE 2 PUFFS BY MOUTH EVERY 6 HOURS AS NEEDED FOR WHEEZING OR SHORTNESS OF BREATH  . sitaGLIPtin (JANUVIA) 50 MG tablet Take 1 tablet (50 mg total) by mouth daily.  Marland Kitchen tiZANidine (ZANAFLEX) 4 MG tablet TAKE 1 TABLET BY MOUTH TWICE DAILY AS NEEDED FOR MUSCLE SPASMS  . warfarin (COUMADIN) 5 MG tablet TAKE AS DIRECTED BY COUMADIN CLINIC  . budesonide-formoterol (SYMBICORT) 160-4.5 MCG/ACT inhaler Inhale 1 puff into the lungs 2 (two) times daily.  Marland Kitchen HYDROcodone-acetaminophen (NORCO) 10-325 MG tablet Take 1 tablet by mouth every 8 (eight) hours as needed. (Patient not taking: Reported on 09/12/2016)   No current facility-administered medications on file prior to visit.     Review of Systems Per HPI unless specifically indicated in ROS section     Objective:    BP 128/78  Pulse 72   Temp 97.4 F (36.3 C) (Oral)   Wt 225 lb 8 oz (102.3 kg)   BMI 29.75 kg/m   Wt Readings from Last 3 Encounters:  09/12/16 225 lb 8 oz (102.3 kg)  06/30/16 236 lb 4 oz (107.2 kg)  04/10/16 241 lb (109.3 kg)    Physical Exam  Constitutional: He appears well-developed and well-nourished. No distress.  Musculoskeletal: Normal range of motion. He exhibits no edema.  Bilateral inguinal incisions c/d/i No significant pain to palpation of L medial thigh Salon pas patch on anterior L thigh No palpable cords, no popliteal fullness FROM  at knee  Nursing note and vitals reviewed.     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  Pt requests they return here 2 wks for incision check, states this was also recommended by [facility].       Thigh cramp - Primary    Increased thigh cramping after recent endovascular AAA repair. Check labs today. Pt endorses slowly improving. Anticipate will continue to improve.       Relevant Orders   CBC with Differential/Platelet (Completed)   Basic metabolic panel (Completed)   Magnesium (Completed)   CK (Completed)    Other Visit Diagnoses    Other elevated white blood cell (WBC) count       Relevant Orders   POCT Urinalysis Dipstick (Automated) (Completed)   Other microscopic hematuria       Relevant Orders   Urine culture       Follow up plan: Return in about 2 weeks (around 09/26/2016) for follow up visit.  Jim Bush, MD

## 2016-09-14 ENCOUNTER — Other Ambulatory Visit: Payer: Self-pay | Admitting: Family Medicine

## 2016-09-14 DIAGNOSIS — D72829 Elevated white blood cell count, unspecified: Secondary | ICD-10-CM

## 2016-09-14 LAB — URINE CULTURE: ORGANISM ID, BACTERIA: NO GROWTH

## 2016-09-15 ENCOUNTER — Ambulatory Visit: Payer: Medicare Other | Admitting: Family Medicine

## 2016-09-15 NOTE — Telephone Encounter (Signed)
See results note. 

## 2016-09-17 ENCOUNTER — Other Ambulatory Visit (INDEPENDENT_AMBULATORY_CARE_PROVIDER_SITE_OTHER): Payer: Medicare Other

## 2016-09-17 ENCOUNTER — Ambulatory Visit (INDEPENDENT_AMBULATORY_CARE_PROVIDER_SITE_OTHER): Payer: Medicare Other | Admitting: Family Medicine

## 2016-09-17 ENCOUNTER — Encounter: Payer: Self-pay | Admitting: Family Medicine

## 2016-09-17 VITALS — BP 132/78 | HR 84 | Temp 98.4°F | Resp 20 | Wt 222.0 lb

## 2016-09-17 DIAGNOSIS — L02215 Cutaneous abscess of perineum: Secondary | ICD-10-CM | POA: Diagnosis not present

## 2016-09-17 DIAGNOSIS — D72829 Elevated white blood cell count, unspecified: Secondary | ICD-10-CM

## 2016-09-17 DIAGNOSIS — R252 Cramp and spasm: Secondary | ICD-10-CM

## 2016-09-17 LAB — HEPATIC FUNCTION PANEL
ALBUMIN: 3.3 g/dL — AB (ref 3.5–5.2)
ALT: 13 U/L (ref 0–53)
AST: 12 U/L (ref 0–37)
Alkaline Phosphatase: 67 U/L (ref 39–117)
BILIRUBIN TOTAL: 0.4 mg/dL (ref 0.2–1.2)
Bilirubin, Direct: 0 mg/dL (ref 0.0–0.3)
Total Protein: 6.8 g/dL (ref 6.0–8.3)

## 2016-09-17 LAB — CBC WITH DIFFERENTIAL/PLATELET
BASOS ABS: 0 10*3/uL (ref 0.0–0.1)
BASOS PCT: 0.4 % (ref 0.0–3.0)
EOS ABS: 0.3 10*3/uL (ref 0.0–0.7)
Eosinophils Relative: 2.5 % (ref 0.0–5.0)
HEMATOCRIT: 33.4 % — AB (ref 39.0–52.0)
HEMOGLOBIN: 10.8 g/dL — AB (ref 13.0–17.0)
LYMPHS ABS: 1.7 10*3/uL (ref 0.7–4.0)
LYMPHS PCT: 13.2 % (ref 12.0–46.0)
MCHC: 32.5 g/dL (ref 30.0–36.0)
MCV: 84.8 fl (ref 78.0–100.0)
Monocytes Absolute: 0.8 10*3/uL (ref 0.1–1.0)
Monocytes Relative: 6.3 % (ref 3.0–12.0)
NEUTROS ABS: 9.7 10*3/uL — AB (ref 1.4–7.7)
NEUTROS PCT: 77.6 % — AB (ref 43.0–77.0)
PLATELETS: 440 10*3/uL — AB (ref 150.0–400.0)
RBC: 3.93 Mil/uL — AB (ref 4.22–5.81)
RDW: 15.9 % — ABNORMAL HIGH (ref 11.5–15.5)
WBC: 12.6 10*3/uL — ABNORMAL HIGH (ref 4.0–10.5)

## 2016-09-17 LAB — SEDIMENTATION RATE: SED RATE: 56 mm/h — AB (ref 0–20)

## 2016-09-17 NOTE — Assessment & Plan Note (Signed)
Improved over time.

## 2016-09-17 NOTE — Assessment & Plan Note (Signed)
?  possible cause of recent leukocytosis. Today looking good - anticipate will continue to heal with home treatment of warm compresses, neosporin ointment. No signs of surrounding infection, no need for systemic abx, nothing to drain today.

## 2016-09-17 NOTE — Progress Notes (Signed)
BP 132/78   Pulse 84   Temp 98.4 F (36.9 C) (Oral)   Resp 20   Wt 222 lb (100.7 kg)   SpO2 98%   BMI 29.29 kg/m    CC: check boil, f/u labs Subjective:    Patient ID: [name], male    DOB: [birth year], 70 y.o.   MRN: [id number]  HPI: [name] is a 70 y.o. male presenting on 09/17/2016 for Recurrent Skin Infections (broken skin) and Breast Pain (right breast area)   See recent visit and labs for details. CBC returned with surprising leukocytosis to 21 with left shift. This in setting of recent fenestrated endovascular AAA repair.   Pt reports feeling better and better - improving leg cramping, leg numbness, bruising. No fevers/chills. No leg swelling.   Yesterday noticed boil R groin, tried to drain without result. Treated with vaseline and neosporin and warm compress. Today looking better.   Noticing mild L breast pain over last few days. Denies other chest pain, dyspnea, cough or wheezing.   Ongoing lower back pain, improving with lumbar support brace.   Relevant past medical, surgical, family and social history reviewed and updated as indicated. Interim medical history since our last visit reviewed. Allergies and medications reviewed and updated. Current Outpatient Prescriptions on File Prior to Visit  Medication Sig  . amiodarone (PACERONE) 100 MG tablet Take 100 mg by mouth daily.  . budesonide-formoterol (SYMBICORT) 160-4.5 MCG/ACT inhaler Inhale 1 puff into the lungs 2 (two) times daily.  . Coenzyme Q10 100 MG capsule Take 1 capsule (100 mg total) by mouth daily.  . diclofenac sodium (VOLTAREN) 1 % GEL Apply 2 g topically 4 (four) times daily.  . diphenoxylate-atropine (LOMOTIL) 2.5-0.025 MG tablet Take 1 tablet by mouth 2 (two) times daily as needed for diarrhea or loose stools.  . docusate sodium (COLACE) 100 MG capsule Take 100 mg by mouth 2 (two) times daily.  . fish oil-omega-3 fatty acids 1000 MG capsule Take 2 g by mouth daily.   . fluticasone  (FLONASE) 50 MCG/ACT nasal spray Place 2 sprays into both nostrils daily.  . fluticasone furoate-vilanterol (BREO ELLIPTA) 200-25 MCG/INH AEPB Inhale 1 puff into the lungs daily.  . furosemide (LASIX) 20 MG tablet TAKE 1 TABLET BY MOUTH AS NEEDED FOR FLUID  . gabapentin (NEURONTIN) 600 MG tablet TAKE 1 TABLET BY MOUTH THREE TIMES DAILY  . glucose blood (ONE TOUCH ULTRA TEST) test strip Check blood sugar once daily and as needed. 250.60  . Glucose Blood (ONETOUCH ULTRA BLUE VI) by In Vitro route. Test blood glucose 1 time per day and as directed.   Marland Kitchen HYDROcodone-acetaminophen (NORCO) 10-325 MG tablet Take 1 tablet by mouth every 8 (eight) hours as needed.  . metFORMIN (GLUCOPHAGE) 500 MG tablet TAKE 1 TABLET BY MOUTH EVERY MORNING AND 2 TABLETS EVERY EVENING (Patient taking differently: TAKE 2 TABLET BY MOUTH EVERY MORNING AND 2 TABLETS EVERY EVENING)  . nitroGLYCERIN (NITROLINGUAL) 0.4 MG/SPRAY spray Place 1 spray under the tongue every 5 (five) minutes as needed.  Marland Kitchen omeprazole (PRILOSEC) 40 MG capsule TAKE 1 CAPSULE BY MOUTH EVERY DAY  . Oxycodone HCl 10 MG TABS Take 10 mg by mouth every 4 (four) hours as needed.  . potassium chloride (K-DUR,KLOR-CON) 10 MEQ tablet TAKE 2 TABLETS BY MOUTH TWICE DAILY  . PROAIR HFA 108 (90 Base) MCG/ACT inhaler INHALE 2 PUFFS BY MOUTH EVERY 6 HOURS AS NEEDED FOR WHEEZING OR SHORTNESS OF BREATH  . sitaGLIPtin (JANUVIA)  50 MG tablet Take 1 tablet (50 mg total) by mouth daily.  Marland Kitchen tiZANidine (ZANAFLEX) 4 MG tablet TAKE 1 TABLET BY MOUTH TWICE DAILY AS NEEDED FOR MUSCLE SPASMS  . warfarin (COUMADIN) 5 MG tablet TAKE AS DIRECTED BY COUMADIN CLINIC   No current facility-administered medications on file prior to visit.     Review of Systems Per HPI unless specifically indicated in ROS section     Objective:    BP 132/78   Pulse 84   Temp 98.4 F (36.9 C) (Oral)   Resp 20   Wt 222 lb (100.7 kg)   SpO2 98%   BMI 29.29 kg/m   Wt Readings from Last 3  Encounters:  09/17/16 222 lb (100.7 kg)  09/12/16 225 lb 8 oz (102.3 kg)  06/30/16 236 lb 4 oz (107.2 kg)    Physical Exam  Constitutional: He appears well-developed and well-nourished. No distress.  HENT:  Mouth/Throat: Oropharynx is clear and moist. No oropharyngeal exudate.  Cardiovascular: Normal rate, regular rhythm, normal heart sounds and intact distal pulses.   No murmur heard. Pulmonary/Chest: Effort normal and breath sounds normal. No respiratory distress. He has no wheezes. He has no rales. He exhibits tenderness (point tender to palpation lateral left ribcage ).  Musculoskeletal: He exhibits no edema.  Skin: Skin is warm and dry. No rash noted.  Healing incision sites R perineal region with mildy indurated sore without fluctuance or erythema, slight drainage present  Nursing note and vitals reviewed.  Results for orders placed or performed in visit on 09/12/16  Urine culture  Result Value Ref Range   Organism ID, Bacteria NO GROWTH   CBC with Differential/Platelet  Result Value Ref Range   WBC 21.9 Repeated and verified X2. (HH) 4.0 - 10.5 K/uL   RBC 4.70 4.22 - 5.81 Mil/uL   Hemoglobin 13.0 13.0 - 17.0 g/dL   HCT 08.3 87.8 - 28.0 %   MCV 83.9 78.0 - 100.0 fl   MCHC 32.9 30.0 - 36.0 g/dL   RDW 76.6 (H) 66.2 - 31.2 %   Platelets 284.0 150.0 - 400.0 K/uL   Neutrophils Relative % 80.7 (H) 43.0 - 77.0 %   Lymphocytes Relative 10.8 (L) 12.0 - 46.0 %   Monocytes Relative 7.5 3.0 - 12.0 %   Eosinophils Relative 0.6 0.0 - 5.0 %   Basophils Relative 0.4 0.0 - 3.0 %   Neutro Abs 17.6 (H) 1.4 - 7.7 K/uL   Lymphs Abs 2.4 0.7 - 4.0 K/uL   Monocytes Absolute 1.6 (H) 0.1 - 1.0 K/uL   Eosinophils Absolute 0.1 0.0 - 0.7 K/uL   Basophils Absolute 0.1 0.0 - 0.1 K/uL  Basic metabolic panel  Result Value Ref Range   Sodium 134 (L) 135 - 145 mEq/L   Potassium 4.4 3.5 - 5.1 mEq/L   Chloride 98 96 - 112 mEq/L   CO2 25 19 - 32 mEq/L   Glucose, Bld 178 (H) 70 - 99 mg/dL   BUN 18 6  - 23 mg/dL   Creatinine, Ser 9.06 0.40 - 1.50 mg/dL   Calcium 9.3 8.4 - 46.1 mg/dL   GFR 40.12 (L) >03.58 mL/min  Magnesium  Result Value Ref Range   Magnesium 2.2 1.5 - 2.5 mg/dL  CK  Result Value Ref Range   Total CK 565 (H) 7 - 232 U/L  POCT Urinalysis Dipstick (Automated)  Result Value Ref Range   Color, UA Dark Yellow    Clarity, UA Hazy    Glucose,  UA Negative    Bilirubin, UA Negative    Ketones, UA 1+    Spec Grav, UA >=1.030    Blood, UA 1+    pH, UA 5.5    Protein, UA Trace    Urobilinogen, UA 0.2    Nitrite, UA Negative    Leukocytes, UA Negative Negative      Assessment & Plan:  Chest wall pain - anticipate rib strain, reassured Leukocytosis - rpt labs today (CBC, LFTs, ESR and periph smear). Possibly due to recent perineal boil/abscess that is resolving. If WBC not trending down, pt will return for CXR. Problem List Items Addressed This Visit    Perineal abscess, superficial    ?possible cause of recent leukocytosis. Today looking good - anticipate will continue to heal with home treatment of warm compresses, neosporin ointment. No signs of surrounding infection, no need for systemic abx, nothing to drain today.       RESOLVED: Thigh cramp    Improved over time.       Other Visit Diagnoses   None.      Follow up plan: Return as needed.  Ria Bush, MD

## 2016-09-17 NOTE — Progress Notes (Signed)
Pre visit review using our clinic review tool, if applicable. No additional management support is needed unless otherwise documented below in the visit note. 

## 2016-09-17 NOTE — Patient Instructions (Signed)
I'm glad you're doing well today. Labs today

## 2016-09-18 LAB — PATHOLOGIST SMEAR REVIEW

## 2016-09-23 ENCOUNTER — Telehealth: Payer: Self-pay | Admitting: Family Medicine

## 2016-09-23 ENCOUNTER — Ambulatory Visit: Payer: Medicare Other | Admitting: Family Medicine

## 2016-09-23 NOTE — Telephone Encounter (Signed)
plz cancel thx

## 2016-09-23 NOTE — Telephone Encounter (Signed)
Pt's EC called at 9:41 am to cancel 10:45 appt today with PCP.  Explained there may be a $50 late cancellation fee.  Appointment rescheduled for 09/26/16 for 2 week followup.  Would you like to cancel appointment for 09/23/16?

## 2016-09-24 ENCOUNTER — Other Ambulatory Visit: Payer: Self-pay

## 2016-09-24 MED ORDER — HYDROCODONE-ACETAMINOPHEN 10-325 MG PO TABS
1.0000 | ORAL_TABLET | Freq: Three times a day (TID) | ORAL | 0 refills | Status: DC | PRN
Start: 2016-09-24 — End: 2016-10-21

## 2016-09-24 NOTE — Telephone Encounter (Signed)
Printed and in Kim's box 

## 2016-09-24 NOTE — Telephone Encounter (Addendum)
[name] DPR signed left v/m requesting rx hydrocodone apap. Call when ready for pick up. Last printed # 60 on 09/01/16. Last seen 09/12/16.In [place] said pt no longer taking oxycodone and pt has appt with Dr Darnell Level on 09/26/16.

## 2016-09-25 NOTE — Telephone Encounter (Addendum)
Attempted to call. No answer and VM is full. Unable to leave message. Rx placed up front for pick up.

## 2016-09-26 ENCOUNTER — Ambulatory Visit (INDEPENDENT_AMBULATORY_CARE_PROVIDER_SITE_OTHER): Payer: Medicare Other | Admitting: Family Medicine

## 2016-09-26 ENCOUNTER — Encounter: Payer: Self-pay | Admitting: Family Medicine

## 2016-09-26 VITALS — BP 142/64 | HR 80 | Temp 97.4°F | Wt 225.5 lb

## 2016-09-26 DIAGNOSIS — I482 Chronic atrial fibrillation, unspecified: Secondary | ICD-10-CM

## 2016-09-26 DIAGNOSIS — Z1159 Encounter for screening for other viral diseases: Secondary | ICD-10-CM

## 2016-09-26 DIAGNOSIS — G4733 Obstructive sleep apnea (adult) (pediatric): Secondary | ICD-10-CM

## 2016-09-26 DIAGNOSIS — E1140 Type 2 diabetes mellitus with diabetic neuropathy, unspecified: Secondary | ICD-10-CM

## 2016-09-26 DIAGNOSIS — E1165 Type 2 diabetes mellitus with hyperglycemia: Secondary | ICD-10-CM

## 2016-09-26 DIAGNOSIS — I714 Abdominal aortic aneurysm, without rupture, unspecified: Secondary | ICD-10-CM

## 2016-09-26 DIAGNOSIS — G894 Chronic pain syndrome: Secondary | ICD-10-CM

## 2016-09-26 DIAGNOSIS — IMO0002 Reserved for concepts with insufficient information to code with codable children: Secondary | ICD-10-CM

## 2016-09-26 DIAGNOSIS — R252 Cramp and spasm: Secondary | ICD-10-CM

## 2016-09-26 LAB — MICROALBUMIN / CREATININE URINE RATIO
CREATININE, U: 24 mg/dL
MICROALB/CREAT RATIO: 2.9 mg/g (ref 0.0–30.0)

## 2016-09-26 LAB — PROTIME-INR
INR: 2.3 ratio — ABNORMAL HIGH (ref 0.8–1.0)
PROTHROMBIN TIME: 24.2 s — AB (ref 9.6–13.1)

## 2016-09-26 LAB — CBC WITH DIFFERENTIAL/PLATELET
BASOS ABS: 0.1 10*3/uL (ref 0.0–0.1)
Basophils Relative: 0.4 % (ref 0.0–3.0)
EOS ABS: 0.2 10*3/uL (ref 0.0–0.7)
Eosinophils Relative: 1.5 % (ref 0.0–5.0)
HCT: 37.7 % — ABNORMAL LOW (ref 39.0–52.0)
Hemoglobin: 11.9 g/dL — ABNORMAL LOW (ref 13.0–17.0)
LYMPHS ABS: 2.1 10*3/uL (ref 0.7–4.0)
LYMPHS PCT: 15.5 % (ref 12.0–46.0)
MCHC: 31.7 g/dL (ref 30.0–36.0)
MCV: 84.1 fl (ref 78.0–100.0)
MONOS PCT: 4.4 % (ref 3.0–12.0)
Monocytes Absolute: 0.6 10*3/uL (ref 0.1–1.0)
NEUTROS ABS: 10.4 10*3/uL — AB (ref 1.4–7.7)
NEUTROS PCT: 78.2 % — AB (ref 43.0–77.0)
PLATELETS: 500 10*3/uL — AB (ref 150.0–400.0)
RBC: 4.48 Mil/uL (ref 4.22–5.81)
RDW: 15.9 % — ABNORMAL HIGH (ref 11.5–15.5)
WBC: 13.4 10*3/uL — ABNORMAL HIGH (ref 4.0–10.5)

## 2016-09-26 MED ORDER — DIPHENOXYLATE-ATROPINE 2.5-0.025 MG PO TABS: 1.0000 | ORAL_TABLET | Freq: Two times a day (BID) | ORAL | 0 refills | Status: DC | PRN

## 2016-09-26 NOTE — Assessment & Plan Note (Signed)
Completed oxycodone course. Back on hydrocodone. Will need updated UDS next visit.

## 2016-09-26 NOTE — Assessment & Plan Note (Signed)
Improved

## 2016-09-26 NOTE — Assessment & Plan Note (Signed)
Not due for A1c yet. Check microalbumin.

## 2016-09-26 NOTE — Assessment & Plan Note (Signed)
Driver. Reviewed caffeine use, encouraged decreased use.

## 2016-09-26 NOTE — Assessment & Plan Note (Signed)
Appreciate [facility] care of patient. Has f/u next month.

## 2016-09-26 NOTE — Assessment & Plan Note (Signed)
Coumadin held prior to recent AAA endovascular repair, now back on coumadin for last 2 wks. Has not recnetly seen coumadin clinic. Update INR.

## 2016-09-26 NOTE — Progress Notes (Signed)
Pre visit review using our clinic review tool, if applicable. No additional management support is needed unless otherwise documented below in the visit note. 

## 2016-09-26 NOTE — Progress Notes (Signed)
BP (!) 142/64   Pulse 80   Temp 97.4 F (36.3 C) (Oral)   Wt 225 lb 8 oz (102.3 kg)   BMI 29.75 kg/m    CC: f/u visit Subjective:    Patient ID: [name], male    DOB: August 18, 1946, 70 y.o.   MRN: [id number]  HPI: [name] is a 70 y.o. male presenting on 09/26/2016 for Follow-up   Asks about caffeine supplement he takes when driving. Takes 2 200mg  caffeine tablets in afternoons.   Overall feeling well. Notes sensation of left leg has started to return since fenestrated AAA repair.   Lab Results  Component Value Date   INR 2.1 08/13/2016   INR 4.0 07/25/2016   INR 2.88 07/14/2016   Diabetic Foot Exam - Simple   Simple Foot Form Diabetic Foot exam was performed with the following findings:  Yes 09/26/2016 10:56 AM  Visual Inspection No deformities, no ulcerations, no other skin breakdown bilaterally:  Yes Sensation Testing Intact to touch and monofilament testing bilaterally:  Yes Pulse Check Posterior Tibialis and Dorsalis pulse intact bilaterally:  Yes Comments Slightly diminished monofilament at R sole      Relevant past medical, surgical, family and social history reviewed and updated as indicated. Interim medical history since our last visit reviewed. Allergies and medications reviewed and updated. Current Outpatient Prescriptions on File Prior to Visit  Medication Sig  . amiodarone (PACERONE) 100 MG tablet Take 100 mg by mouth daily.  . budesonide-formoterol (SYMBICORT) 160-4.5 MCG/ACT inhaler Inhale 1 puff into the lungs 2 (two) times daily.  . Coenzyme Q10 100 MG capsule Take 1 capsule (100 mg total) by mouth daily.  . diclofenac sodium (VOLTAREN) 1 % GEL Apply 2 g topically 4 (four) times daily.  Marland Kitchen docusate sodium (COLACE) 100 MG capsule Take 100 mg by mouth 2 (two) times daily.  . fish oil-omega-3 fatty acids 1000 MG capsule Take 2 g by mouth daily.   . fluticasone (FLONASE) 50 MCG/ACT nasal spray Place 2 sprays into both nostrils daily.  .  fluticasone furoate-vilanterol (BREO ELLIPTA) 200-25 MCG/INH AEPB Inhale 1 puff into the lungs daily.  . furosemide (LASIX) 20 MG tablet TAKE 1 TABLET BY MOUTH AS NEEDED FOR FLUID  . gabapentin (NEURONTIN) 600 MG tablet TAKE 1 TABLET BY MOUTH THREE TIMES DAILY  . glucose blood (ONE TOUCH ULTRA TEST) test strip Check blood sugar once daily and as needed. 250.60  . Glucose Blood (ONETOUCH ULTRA BLUE VI) by In Vitro route. Test blood glucose 1 time per day and as directed.   Marland Kitchen HYDROcodone-acetaminophen (NORCO) 10-325 MG tablet Take 1 tablet by mouth every 8 (eight) hours as needed.  . metFORMIN (GLUCOPHAGE) 500 MG tablet TAKE 1 TABLET BY MOUTH EVERY MORNING AND 2 TABLETS EVERY EVENING (Patient taking differently: TAKE 2 TABLET BY MOUTH EVERY MORNING AND 2 TABLETS EVERY EVENING)  . nitroGLYCERIN (NITROLINGUAL) 0.4 MG/SPRAY spray Place 1 spray under the tongue every 5 (five) minutes as needed.  Marland Kitchen omeprazole (PRILOSEC) 40 MG capsule TAKE 1 CAPSULE BY MOUTH EVERY DAY  . potassium chloride (K-DUR,KLOR-CON) 10 MEQ tablet TAKE 2 TABLETS BY MOUTH TWICE DAILY  . PROAIR HFA 108 (90 Base) MCG/ACT inhaler INHALE 2 PUFFS BY MOUTH EVERY 6 HOURS AS NEEDED FOR WHEEZING OR SHORTNESS OF BREATH  . sitaGLIPtin (JANUVIA) 50 MG tablet Take 1 tablet (50 mg total) by mouth daily.  Marland Kitchen tiZANidine (ZANAFLEX) 4 MG tablet TAKE 1 TABLET BY MOUTH TWICE DAILY AS NEEDED FOR  MUSCLE SPASMS  . warfarin (COUMADIN) 5 MG tablet TAKE AS DIRECTED BY COUMADIN CLINIC   No current facility-administered medications on file prior to visit.     Review of Systems Per HPI unless specifically indicated in ROS section     Objective:    BP (!) 142/64   Pulse 80   Temp 97.4 F (36.3 C) (Oral)   Wt 225 lb 8 oz (102.3 kg)   BMI 29.75 kg/m   Wt Readings from Last 3 Encounters:  09/26/16 225 lb 8 oz (102.3 kg)  09/17/16 222 lb (100.7 kg)  09/12/16 225 lb 8 oz (102.3 kg)    Physical Exam  Constitutional: He appears well-developed and  well-nourished. No distress.  HENT:  Head: Normocephalic and atraumatic.  Right Ear: External ear normal.  Left Ear: External ear normal.  Nose: Nose normal.  Mouth/Throat: Oropharynx is clear and moist. No oropharyngeal exudate.  Eyes: Conjunctivae and EOM are normal. Pupils are equal, round, and reactive to light. No scleral icterus.  Neck: Normal range of motion. Neck supple.  Cardiovascular: Normal rate, regular rhythm, normal heart sounds and intact distal pulses.   No murmur heard. Pulmonary/Chest: Effort normal and breath sounds normal. No respiratory distress. He has no wheezes. He has no rales.  Musculoskeletal: He exhibits no edema.  See HPI for foot exam if done Bilateral groin incisions well healed without any erythema or induration  Lymphadenopathy:    He has no cervical adenopathy.  Skin: Skin is warm and dry. No rash noted.  Psychiatric: He has a normal mood and affect.  Nursing note and vitals reviewed.  Results for orders placed or performed in visit on 09/17/16  CBC with Differential/Platelet  Result Value Ref Range   WBC 12.6 (H) 4.0 - 10.5 K/uL   RBC 3.93 (L) 4.22 - 5.81 Mil/uL   Hemoglobin 10.8 (L) 13.0 - 17.0 g/dL   HCT 33.4 (L) 39.0 - 52.0 %   MCV 84.8 78.0 - 100.0 fl   MCHC 32.5 30.0 - 36.0 g/dL   RDW 15.9 (H) 11.5 - 15.5 %   Platelets 440.0 (H) 150.0 - 400.0 K/uL   Neutrophils Relative % 77.6 (H) 43.0 - 77.0 %   Lymphocytes Relative 13.2 12.0 - 46.0 %   Monocytes Relative 6.3 3.0 - 12.0 %   Eosinophils Relative 2.5 0.0 - 5.0 %   Basophils Relative 0.4 0.0 - 3.0 %   Neutro Abs 9.7 (H) 1.4 - 7.7 K/uL   Lymphs Abs 1.7 0.7 - 4.0 K/uL   Monocytes Absolute 0.8 0.1 - 1.0 K/uL   Eosinophils Absolute 0.3 0.0 - 0.7 K/uL   Basophils Absolute 0.0 0.0 - 0.1 K/uL  Hepatic function panel  Result Value Ref Range   Total Bilirubin 0.4 0.2 - 1.2 mg/dL   Bilirubin, Direct 0.0 0.0 - 0.3 mg/dL   Alkaline Phosphatase 67 39 - 117 U/L   AST 12 0 - 37 U/L   ALT 13 0 -  53 U/L   Total Protein 6.8 6.0 - 8.3 g/dL   Albumin 3.3 (L) 3.5 - 5.2 g/dL  Pathologist smear review  Result Value Ref Range   Path Review SEE NOTE   Sedimentation rate  Result Value Ref Range   Sed Rate 56 (H) 0 - 20 mm/hr   Lab Results  Component Value Date   HGBA1C 8.0 (H) 07/16/2016       Assessment & Plan:   Problem List Items Addressed This Visit    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 Has f/u next month.       Relevant Orders   CBC with Differential/Platelet   Atrial fibrillation (HCC)    Coumadin held prior to recent AAA endovascular repair, now back on coumadin for last 2 wks. Has not recnetly seen coumadin clinic. Update INR.      Relevant Orders   Protime-INR   Chronic pain syndrome    Completed oxycodone course. Back on hydrocodone. Will need updated UDS next visit.       LEG CRAMPS, IDIOPATHIC    Improved.       OSA (obstructive sleep apnea)    Driver. Reviewed caffeine use, encouraged decreased use.       Type 2 diabetes, uncontrolled, with neuropathy ([place])    Not due for A1c yet. Check microalbumin.      Relevant Orders   Microalbumin / creatinine urine ratio    Other Visit Diagnoses    Need for hepatitis C screening test       Relevant Orders   Hepatitis C antibody       Follow up plan: Return in about 4 weeks (around 10/24/2016) for medicare wellness visit.  Ria Bush, MD

## 2016-09-26 NOTE — Patient Instructions (Addendum)
Decrease caffeine tablets to one a day Labs today - INR and blood count.  Return 1 month for follow medicare wellness visit

## 2016-09-27 LAB — HEPATITIS C ANTIBODY: HCV AB: NEGATIVE

## 2016-09-29 ENCOUNTER — Ambulatory Visit (INDEPENDENT_AMBULATORY_CARE_PROVIDER_SITE_OTHER): Payer: Medicare Other

## 2016-09-29 DIAGNOSIS — I482 Chronic atrial fibrillation, unspecified: Secondary | ICD-10-CM

## 2016-10-21 ENCOUNTER — Other Ambulatory Visit: Payer: Self-pay | Admitting: Family Medicine

## 2016-10-21 ENCOUNTER — Other Ambulatory Visit: Payer: Self-pay

## 2016-10-21 ENCOUNTER — Other Ambulatory Visit: Payer: Self-pay | Admitting: Cardiovascular Disease

## 2016-10-21 MED ORDER — HYDROCODONE-ACETAMINOPHEN 10-325 MG PO TABS
1.0000 | ORAL_TABLET | Freq: Three times a day (TID) | ORAL | 0 refills | Status: DC | PRN
Start: 2016-10-21 — End: 2016-11-17

## 2016-10-21 NOTE — Telephone Encounter (Signed)
Message left advising patient's wife and Rx placed up front for pick up. 

## 2016-10-21 NOTE — Telephone Encounter (Signed)
Pt left v/m requesting rx hydrocodone apap. Call when ready for pick up. Last printed # 60 on 09/24/16. Last seen 09/26/16. Pt is going out of town in the morning and would like to get rx prior to going out of town.

## 2016-10-21 NOTE — Telephone Encounter (Signed)
Ok to refill? Last filled 08/05/16 #60 3 RF

## 2016-10-21 NOTE — Telephone Encounter (Signed)
Printed and in Kim's box 

## 2016-10-22 ENCOUNTER — Other Ambulatory Visit: Payer: Self-pay | Admitting: Cardiovascular Disease

## 2016-10-22 ENCOUNTER — Other Ambulatory Visit: Payer: Self-pay | Admitting: Family Medicine

## 2016-10-22 NOTE — Telephone Encounter (Signed)
Please advise if ok to refill. Med not on patient's med list. Thanks!

## 2016-10-22 NOTE — Telephone Encounter (Signed)
Please review for refill. Thanks!  

## 2016-11-05 ENCOUNTER — Ambulatory Visit (INDEPENDENT_AMBULATORY_CARE_PROVIDER_SITE_OTHER): Payer: Medicare Other

## 2016-11-05 DIAGNOSIS — I4891 Unspecified atrial fibrillation: Secondary | ICD-10-CM | POA: Diagnosis not present

## 2016-11-05 LAB — POCT INR: INR: 2.3

## 2016-11-17 ENCOUNTER — Other Ambulatory Visit: Payer: Self-pay

## 2016-11-17 NOTE — Telephone Encounter (Signed)
[name] (DPR signed) left v/m requesting rx hydrocodone apap. Call when ready for pick up. Last printed # 60 on 10/21/16. Pt last seen 09/26/16.

## 2016-11-18 MED ORDER — HYDROCODONE-ACETAMINOPHEN 10-325 MG PO TABS: 1.0000 | ORAL_TABLET | Freq: Three times a day (TID) | ORAL | 0 refills | Status: DC | PRN

## 2016-11-18 NOTE — Telephone Encounter (Signed)
Message left advising patient's wife and Rx placed up front for pick up. 

## 2016-11-18 NOTE — Telephone Encounter (Signed)
Printed.  Thanks.  

## 2016-12-04 ENCOUNTER — Telehealth: Payer: Self-pay | Admitting: Family Medicine

## 2016-12-04 ENCOUNTER — Other Ambulatory Visit: Payer: Self-pay | Admitting: Family Medicine

## 2016-12-04 NOTE — Telephone Encounter (Signed)
Last filled 11/18/2016 - why early? Next due 12/19/2016.

## 2016-12-04 NOTE — Telephone Encounter (Signed)
Message left for [name] to return my call.

## 2016-12-04 NOTE — Telephone Encounter (Signed)
[name] left v/m requesting rx hydrocodone apap. Call when ready for pick up. Last printed # 60 on 11/18/16. Last seen 09/26/16.

## 2016-12-04 NOTE — Telephone Encounter (Signed)
OK to refill? Last filled 10/22/16 #180 0RF

## 2016-12-05 MED ORDER — HYDROCODONE-ACETAMINOPHEN 10-325 MG PO TABS: 1.0000 | ORAL_TABLET | Freq: Three times a day (TID) | ORAL | 0 refills | Status: DC | PRN

## 2016-12-05 NOTE — Telephone Encounter (Signed)
Notify tizanidine #180 sent in last month. Should not need refill even though he had a fall. Gabapentin has been refilled.  Hydrocodone #15 to last through weekend printed and in [facility]' box., will discuss at [place] visit.

## 2016-12-05 NOTE — Addendum Note (Signed)
Addended by: Ria Bush on: 12/05/2016 12:08 PM   Modules accepted: Orders

## 2016-12-05 NOTE — Telephone Encounter (Signed)
[name] left v/m; pt had a bad fall the other day and pt is in a lot of pain;that is why requesting refill for hydrocodone, tizanidine and gabapentin. Pt has appt with Dr Darnell Level on 12/10/15. [name] request cb.

## 2016-12-05 NOTE — Telephone Encounter (Signed)
Patient's wife notified. She said he has been taking 4mg  of tizanidine like he always has, so since he had 2mg  tabs, he has been taking 2 at a time. (It looks like you increased him to 4mg  after a fall in January, but it was only for a short time and he may have just continued it)  Notified Rx was ready for hydrocodone and Rx placed up front for pick up.   Advised meds would be discussed at upcoming appt.

## 2016-12-05 NOTE — Telephone Encounter (Signed)
Denied as 180 given last month.

## 2016-12-09 ENCOUNTER — Encounter: Payer: Self-pay | Admitting: Family Medicine

## 2016-12-09 ENCOUNTER — Ambulatory Visit (INDEPENDENT_AMBULATORY_CARE_PROVIDER_SITE_OTHER)
Admission: RE | Admit: 2016-12-09 | Discharge: 2016-12-09 | Disposition: A | Discharge status: Home / Self Care | Payer: Medicare Other | Source: Ambulatory Visit | Attending: Family Medicine | Admitting: Family Medicine

## 2016-12-09 ENCOUNTER — Ambulatory Visit (INDEPENDENT_AMBULATORY_CARE_PROVIDER_SITE_OTHER): Payer: Medicare Other | Admitting: Family Medicine

## 2016-12-09 ENCOUNTER — Ambulatory Visit (INDEPENDENT_AMBULATORY_CARE_PROVIDER_SITE_OTHER): Payer: Medicare Other

## 2016-12-09 VITALS — BP 122/64 | HR 72 | Temp 97.6°F | Ht 70.0 in | Wt 229.5 lb

## 2016-12-09 DIAGNOSIS — M79631 Pain in right forearm: Secondary | ICD-10-CM | POA: Diagnosis not present

## 2016-12-09 DIAGNOSIS — Z0000 Encounter for general adult medical examination without abnormal findings: Secondary | ICD-10-CM | POA: Diagnosis not present

## 2016-12-09 DIAGNOSIS — R892 Abnormal level of other drugs, medicaments and biological substances in specimens from other organs, systems and tissues: Secondary | ICD-10-CM | POA: Diagnosis not present

## 2016-12-09 DIAGNOSIS — G894 Chronic pain syndrome: Secondary | ICD-10-CM

## 2016-12-09 DIAGNOSIS — M25511 Pain in right shoulder: Secondary | ICD-10-CM

## 2016-12-09 DIAGNOSIS — M542 Cervicalgia: Secondary | ICD-10-CM

## 2016-12-09 DIAGNOSIS — M5030 Other cervical disc degeneration, unspecified cervical region: Secondary | ICD-10-CM

## 2016-12-09 DIAGNOSIS — W19XXXA Unspecified fall, initial encounter: Secondary | ICD-10-CM

## 2016-12-09 DIAGNOSIS — M79601 Pain in right arm: Secondary | ICD-10-CM

## 2016-12-09 DIAGNOSIS — S4991XA Unspecified injury of right shoulder and upper arm, initial encounter: Secondary | ICD-10-CM | POA: Diagnosis not present

## 2016-12-09 DIAGNOSIS — Y92009 Unspecified place in unspecified non-institutional (private) residence as the place of occurrence of the external cause: Secondary | ICD-10-CM

## 2016-12-09 DIAGNOSIS — G8929 Other chronic pain: Secondary | ICD-10-CM

## 2016-12-09 DIAGNOSIS — S59911A Unspecified injury of right forearm, initial encounter: Secondary | ICD-10-CM | POA: Diagnosis not present

## 2016-12-09 DIAGNOSIS — Y92099 Unspecified place in other non-institutional residence as the place of occurrence of the external cause: Secondary | ICD-10-CM

## 2016-12-09 MED ORDER — HYDROCODONE-ACETAMINOPHEN 10-325 MG PO TABS: 1.0000 | ORAL_TABLET | Freq: Two times a day (BID) | ORAL | 0 refills | Status: DC | PRN

## 2016-12-09 MED ORDER — TIZANIDINE HCL 4 MG PO TABS: 4.0000 mg | ORAL_TABLET | Freq: Two times a day (BID) | ORAL | 3 refills | Status: DC | PRN

## 2016-12-09 NOTE — Patient Instructions (Addendum)
Update UDS today. zanaflex 4mg  refilled today. Hydrocodone Rx written today - fill on Thursday.  Xrays today.
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
## 2016-12-09 NOTE — Progress Notes (Signed)
PCP notes:   Health maintenance:  Pt has declined all immunizations and colonoscopy.  Abnormal screenings:   Fall risk - pt reports multiple falls with injury  Patient concerns:   Pt is here today to discuss pain secondary to fall.   Nurse concerns:  None  Next PCP appt:   12/09/16 @ 1500

## 2016-12-09 NOTE — Progress Notes (Signed)
Subjective:   [name] is a 71 y.o. male who presents for an Initial Medicare Annual Wellness Visit.  Review of Systems  N/A Cardiac Risk Factors include: advanced age (>20men, >74 women);male gender;dyslipidemia;diabetes mellitus;smoking/ tobacco exposure;obesity (BMI >30kg/m2)    Objective:    Today's Vitals   12/09/16 1451 12/09/16 1510  BP: 122/64   Pulse: 72   Temp: 97.6 F (36.4 C)   TempSrc: Oral   SpO2: 98%   Weight: 229 lb 8 oz (104.1 kg)   Height: 5\' 10"  (1.778 m)   PainSc: 7  7   PainLoc: Generalized    Body mass index is 32.93 kg/m.  Current Medications (verified) Outpatient Encounter Prescriptions as of 12/09/2016  Medication Sig  . amiodarone (PACERONE) 100 MG tablet Take 100 mg by mouth daily.  . budesonide-formoterol (SYMBICORT) 160-4.5 MCG/ACT inhaler Inhale 1 puff into the lungs 2 (two) times daily.  . Coenzyme Q10 100 MG capsule Take 1 capsule (100 mg total) by mouth daily.  . diclofenac sodium (VOLTAREN) 1 % GEL Apply 2 g topically 4 (four) times daily.  . diphenoxylate-atropine (LOMOTIL) 2.5-0.025 MG tablet Take 1 tablet by mouth 2 (two) times daily as needed for diarrhea or loose stools.  . docusate sodium (COLACE) 100 MG capsule Take 100 mg by mouth 2 (two) times daily.  . fish oil-omega-3 fatty acids 1000 MG capsule Take 2 g by mouth daily.   . fluticasone (FLONASE) 50 MCG/ACT nasal spray Place 2 sprays into both nostrils daily.  . fluticasone furoate-vilanterol (BREO ELLIPTA) 200-25 MCG/INH AEPB Inhale 1 puff into the lungs daily.  . furosemide (LASIX) 20 MG tablet TAKE 1 TABLET BY MOUTH AS NEEDED FOR FLUID  . gabapentin (NEURONTIN) 600 MG tablet TAKE 1 TABLET BY MOUTH THREE TIMES DAILY  . glucose blood (ONE TOUCH ULTRA TEST) test strip Check blood sugar once daily and as needed. 250.60  . Glucose Blood (ONETOUCH ULTRA BLUE VI) by In Vitro route. Test blood glucose 1 time per day and as directed.   Marland Kitchen HYDROcodone-acetaminophen (NORCO) 10-325 MG  tablet Take 1 tablet by mouth every 8 (eight) hours as needed.  Marland Kitchen JANUVIA 50 MG tablet TAKE 1 TABLET(50 MG) BY MOUTH DAILY  . metFORMIN (GLUCOPHAGE) 500 MG tablet TAKE 1 TABLET BY MOUTH EVERY MORNING AND 2 TABLETS EVERY EVENING (Patient taking differently: TAKE 2 TABLET BY MOUTH EVERY MORNING AND 2 TABLETS EVERY EVENING)  . nitroGLYCERIN (NITROLINGUAL) 0.4 MG/SPRAY spray Place 1 spray under the tongue every 5 (five) minutes as needed.  Marland Kitchen omeprazole (PRILOSEC) 40 MG capsule TAKE 1 CAPSULE BY MOUTH EVERY DAY  . potassium chloride (K-DUR,KLOR-CON) 10 MEQ tablet TAKE 2 TABLETS BY MOUTH TWICE DAILY  . PROAIR HFA 108 (90 Base) MCG/ACT inhaler INHALE 2 PUFFS BY MOUTH EVERY 6 HOURS AS NEEDED FOR WHEEZING OR SHORTNESS OF BREATH  . tiZANidine (ZANAFLEX) 2 MG tablet TAKE 1 TABLET BY MOUTH TWICE DAILY AS NEEDED FOR MUSCLE SPASMS  . warfarin (COUMADIN) 5 MG tablet TAKE AS DIRECTED BY COUMADIN CLINIC   No facility-administered encounter medications on file as of 12/09/2016.     Allergies (verified) Varenicline tartrate; Wellbutrin [bupropion hcl]; and Zocor [simvastatin]   History: Past Medical History:  Diagnosis Date  . AAA (abdominal aortic aneurysm) ([place]) 2013   s/p stent graft repair now with supra/pararenal aneurysm 3.5cm, referred to [facility]. Sammuel Hines at [facility] for endovascular repair (12/2013)  . Abnormal drug screen 06/2015   inapprop neg norco (06/2015), inappr neg narcotics (02/2016)  .  Cervical neck pain with evidence of disc disease 07/2011   MRI - disk bulging and foraminal stenosis, advanced at C4/5, 5/6; rec pain management for ESI by Dr. Mack Guise   . CHF (congestive heart failure) ([place])   . COPD (chronic obstructive pulmonary disease) (H[facility])    mod-severe COPD/emphysema.  PFTs 12/2010.  He still smokes 1 ppd.  . Depression   . ED (erectile dysfunction) 02/2012   penile injections - failed viagra, poor arterial flow ([place])  . Fatty liver   . GERD (gastroesophageal reflux disease)   .  Hyperlipidemia    myalgias with simvastatin and atorvastatin  . Leg cramps    idiopathic severe  . Muscle spasm    chronic  . Neuralgia    pain in hands. L>R from accident  . Obesity   . OSA (obstructive sleep apnea) 03/2012   AHI 18.6, desat to 74%, severe snoring, consider ENT eval  . Osteoarthritis   . Paroxysmal atrial fibrillation (HCC)    on coumadin only.  . Peripheral autonomic neuropathy due to diabetes mellitus ([place])   . Right shoulder injury 05/2012   after fall out of chair, s/p injection, rec conservative management with PT Noemi Chapel)  . Smoker    1ppd  . T2DM (type 2 diabetes mellitus) ([place])    Past Surgical History:  Procedure Laterality Date  . CHOLECYSTECTOMY  2001  . CTA abd  09/2011   6.1cm AAA, bilateral ing hernias, R with bladder wall, promient prostate calcifications  . ENDOVASCULAR STENT INSERTION  11/11/2011   Procedure: ENDOVASCULAR STENT GRAFT INSERTION;  Surgeon: Angelia Mould, MD;  Location: [facility];  Service: Vascular;  Laterality: N/A;  aorta bi iliac  . KNEE SURGERY     L side cartilage taken out  . PFTs  12/2010   mod-severe obstruction, ?bronchodilator response  . TONSILLECTOMY     Family History  Problem Relation Age of Onset  . Heart disease Father   . Leukemia Father   . Coronary artery disease Father   . Melanoma Sister   . Arthritis Mother   . Diabetes Neg Hx   . Stroke Neg Hx    Social History   Occupational History  . truck Software engineer    for years  . 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 Smoking status: Current Every Day Smoker    Packs/day: 1.50    Years: 52.00    Types: Cigarettes  . Smokeless tobacco: Never Used  . Alcohol use 0.0 oz/week     Comment: occasional, 1 glass margarita or glass of wine once every 6 months; occasional beer with tomato juice  . Drug use: No  . Sexual activity: Not on file   Tobacco Counseling Ready to quit: No Counseling given:  No   Activities of Daily Living In your present state of health, do you have any difficulty performing the following activities: 12/09/2016  Hearing? Y  Vision? N  Difficulty concentrating or making decisions? N  Walking or climbing stairs? N  Dressing or bathing? N  Doing errands, shopping? N  Preparing Food and eating ? N  Using the Toilet? N  In the past six months, have you accidently leaked urine? N  Do you have problems with loss of bowel control? N  Managing your Medications? N  Managing your Finances? N  Housekeeping or managing your Housekeeping? N  Some recent data might be hidden    Immunizations and  Health Maintenance  There is no immunization history on file for this patient. There are no preventive care reminders to display for this patient.  Patient Care Team: Ria Bush, MD as PCP - General (Family Medicine) Minna Merritts, MD (Cardiology) Thomasene Ripple, MD as Attending Physician (Vascular Surgery)      Assessment:   This is a routine wellness examination for Harshith.   Hearing/Vision screen  Visual Acuity Screening   Right eye Left eye Both eyes  Without correction: 20/40 20/40-1 20/25-1  With correction:     Hearing Screening Comments: Wears bilateral hearing aids  Dietary issues and exercise activities discussed: Current Exercise Habits: The patient has a physically strenous job, but has no regular exercise apart from work., Exercise limited by: None identified  Goals    . Increase water intake          Starting 12/09/2016, I will continue to drink at least 6-8 glasses of water daily.       Depression Screen PHQ 2/9 Scores 12/09/2016  PHQ - 2 Score 0    Fall Risk Fall Risk  12/09/2016 08/08/2016  Falls in the past year? Yes Yes  Number falls in past yr: 2 or more 2 or more  Injury with Fall? Yes Yes  Risk Factor Category  High Fall Risk -  Risk for fall due to : History of fall(s);Impaired balance/gait -    Cognitive Function: MMSE -  Mini Mental State Exam 12/09/2016  Orientation to time 5  Orientation to Place 5  Registration 3  Attention/ Calculation 0  Recall 3  Language- name 2 objects 0  Language- repeat 1  Language- follow 3 step command 3  Language- read & follow direction 0  Write a sentence 0  Copy design 0  Total score 20       PLEASE NOTE: A Mini-Cog screen was completed. Maximum score is 20. A value of 0 denotes this part of [place]stein MMSE was not completed or the patient failed this part of the Mini-Cog screening.   Mini-Cog Screening Orientation to Time - Max 5 pts Orientation to Place - Max 5 pts Registration - Max 3 pts Recall - Max 3 pts Language Repeat - Max 1 pts Language Follow 3 Step Command - Max 3 pts   Screening Tests Health Maintenance  Topic Date Due  . OPHTHALMOLOGY EXAM  12/09/2017 (Originally 05/02/1956)  . DTaP/Tdap/Td (1 - Tdap) 12/09/2053 (Originally [birth year]03/1965)  . INFLUENZA VACCINE  12/09/2053 (Originally 07/08/2016)  . COLONOSCOPY  12/09/2053 (Originally 05/02/1994)  . ZOSTAVAX  12/09/2053 (Originally 05/02/2004)  . TETANUS/TDAP  12/09/2053 (Originally 05/03/1963)  . PNA vac Low Risk Adult (1 of 2 - PCV13) 12/09/2053 (Originally 05/02/2009)  . HEMOGLOBIN A1C  01/16/2017  . FOOT EXAM  09/26/2017  . URINE MICROALBUMIN  09/26/2017  . Hepatitis C Screening  Completed        Plan:     I have personally reviewed and addressed the Medicare Annual Wellness questionnaire and have noted the following in the patient's chart:  A. Medical and social history B. Use of alcohol, tobacco or illicit drugs  C. Current medications and supplements D. Functional ability and status E.  Nutritional status F.  Physical activity G. Advance directives H. List of other physicians I.  Hospitalizations, surgeries, and ER visits in previous 12 months J.  Chance to include hearing, vision, cognitive, depression L. Referrals and appointments - none  In addition, I have reviewed and  discussed with  patient certain preventive protocols, quality metrics, and best practice recommendations. A written personalized care plan for preventive services as well as general preventive health recommendations were provided to patient.  See attached scanned questionnaire for additional information.   Signed,   Lindell Noe, MHA, BS, LPN Health Coach

## 2016-12-09 NOTE — Assessment & Plan Note (Signed)
Update UDS today.  

## 2016-12-09 NOTE — Progress Notes (Signed)
Pre visit review using our clinic review tool, if applicable. No additional management support is needed unless otherwise documented below in the visit note. 

## 2016-12-09 NOTE — Assessment & Plan Note (Addendum)
Reviewed pain regimen which consists of hydrocodone 10/325mg  BID #62/month, zanaflex 4mg  BID, gabapentin 600mg  TID, tylenol PRN.  Increased hydrocodone use after recent fall.

## 2016-12-09 NOTE — Assessment & Plan Note (Addendum)
Story but not exam consistent with cervicogenic cause.  Update cervical films. Consider MRI if new weakness develops.  Known advanced bulging disc with foraminal stenosis C4/5 5/6 by prior CT 2012.

## 2016-12-09 NOTE — Patient Instructions (Addendum)
[name] , Thank you for taking time to come for your Medicare Wellness Visit. I appreciate your ongoing commitment to your health goals. Please review the following plan we discussed and let me know if I can assist you in the future.   These are the goals we discussed: Goals    . Increase water intake          Starting 12/09/2016, I will continue to drink at least 6-8 glasses of water daily.        This is a list of the screening recommended for you and due dates:  Health Maintenance  Topic Date Due  . Eye exam for diabetics  12/09/2017*  . DTaP/Tdap/Td vaccine (1 - Tdap) 12/09/2053*  . Flu Shot  12/09/2053*  . Colon Cancer Screening  12/09/2053*  . Shingles Vaccine  12/09/2053*  . Tetanus Vaccine  12/09/2053*  . Pneumonia vaccines (1 of 2 - PCV13) 12/09/2053*  . Hemoglobin A1C  01/16/2017  . Complete foot exam   09/26/2017  . Urine Protein Check  09/26/2017  .  Hepatitis C: One time screening is recommended by Center for Disease Control  (CDC) for  adults born from 18 through 1967.   Completed  *Topic was postponed. The date shown is not the original due date.   Preventive Care for Adults  A healthy lifestyle and preventive care can promote health and wellness. Preventive health guidelines for adults include the following key practices.  . A routine yearly physical is a good way to check with your health care provider about your health and preventive screening. It is a chance to share any concerns and updates on your health and to receive a thorough exam.  . Visit your dentist for a routine exam and preventive care every 6 months. Brush your teeth twice a day and floss once a day. Good oral hygiene prevents tooth decay and gum disease.  . The frequency of eye exams is based on your age, health, family medical history, use  of contact lenses, and other factors. Follow your health care provider's ecommendations for frequency of eye exams.  . Eat a healthy diet. Foods like  vegetables, fruits, whole grains, low-fat dairy products, and lean protein foods contain the nutrients you need without too many calories. Decrease your intake of foods high in solid fats, added sugars, and salt. Eat the right amount of calories for you. Get information about a proper diet from your health care provider, if necessary.  . Regular physical exercise is one of the most important things you can do for your health. Most adults should get at least 150 minutes of moderate-intensity exercise (any activity that increases your heart rate and causes you to sweat) each week. In addition, most adults need muscle-strengthening exercises on 2 or more days a week.  Silver Sneakers may be a benefit available to you. To determine eligibility, you may visit the website: www.silversneakers.com or contact program at [phone] Mon-Fri between 8AM-8PM.   . Maintain a healthy weight. The body mass index (BMI) is a screening tool to identify possible weight problems. It provides an estimate of body fat based on height and weight. Your health care provider can find your BMI and can help you achieve or maintain a healthy weight.   For adults 20 years and older: ? A BMI below 18.5 is considered underweight. ? A BMI of 18.5 to 24.9 is normal. ? A BMI of 25 to 29.9 is considered overweight. ? A BMI of 30  and above is considered obese.   . Maintain normal blood lipids and cholesterol levels by exercising and minimizing your intake of saturated fat. Eat a balanced diet with plenty of fruit and vegetables. Blood tests for lipids and cholesterol should begin at age 84 and be repeated every 5 years. If your lipid or cholesterol levels are high, you are over 50, or you are at high risk for heart disease, you may need your cholesterol levels checked more frequently. Ongoing high lipid and cholesterol levels should be treated with medicines if diet and exercise are not working.  . If you smoke, find out from your  health care provider how to quit. If you do not use tobacco, please do not start.  . If you choose to drink alcohol, please do not consume more than 2 drinks per day. One drink is considered to be 12 ounces (355 mL) of beer, 5 ounces (148 mL) of wine, or 1.5 ounces (44 mL) of liquor.  . If you are 56-7 years old, ask your health care provider if you should take aspirin to prevent strokes.  . Use sunscreen. Apply sunscreen liberally and repeatedly throughout the day. You should seek shade when your shadow is shorter than you. Protect yourself by wearing long sleeves, pants, a wide-brimmed hat, and sunglasses year round, whenever you are outdoors.  . Once a month, do a whole body skin exam, using a mirror to look at the skin on your back. Tell your health care provider of new moles, moles that have irregular borders, moles that are larger than a pencil eraser, or moles that have changed in shape or color.
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
## 2016-12-09 NOTE — Progress Notes (Signed)
I reviewed health advisor's note, was available for consultation, and agree with documentation and plan.  

## 2016-12-09 NOTE — Assessment & Plan Note (Addendum)
R shoulder pain presumed from osteoarthritis or possible RTC injury, however now with tenderness to palpation mid humerus and radius - update xrays after recent fall to r/o fracture (shoulder, humerus, forearm).

## 2016-12-10 ENCOUNTER — Encounter: Payer: Self-pay | Admitting: Family Medicine

## 2016-12-10 DIAGNOSIS — Z79891 Long term (current) use of opiate analgesic: Secondary | ICD-10-CM | POA: Diagnosis not present

## 2016-12-11 DIAGNOSIS — I723 Aneurysm of iliac artery: Secondary | ICD-10-CM | POA: Diagnosis not present

## 2016-12-11 DIAGNOSIS — R918 Other nonspecific abnormal finding of lung field: Secondary | ICD-10-CM | POA: Diagnosis not present

## 2016-12-11 DIAGNOSIS — R202 Paresthesia of skin: Secondary | ICD-10-CM | POA: Diagnosis not present

## 2016-12-11 DIAGNOSIS — Z006 Encounter for examination for normal comparison and control in clinical research program: Secondary | ICD-10-CM | POA: Diagnosis not present

## 2016-12-11 DIAGNOSIS — R200 Anesthesia of skin: Secondary | ICD-10-CM | POA: Diagnosis not present

## 2016-12-11 DIAGNOSIS — M79652 Pain in left thigh: Secondary | ICD-10-CM | POA: Diagnosis not present

## 2016-12-11 DIAGNOSIS — I714 Abdominal aortic aneurysm, without rupture: Secondary | ICD-10-CM | POA: Diagnosis not present

## 2016-12-11 DIAGNOSIS — I716 Thoracoabdominal aortic aneurysm, without rupture: Secondary | ICD-10-CM | POA: Diagnosis not present

## 2016-12-11 DIAGNOSIS — Z6833 Body mass index (BMI) 33.0-33.9, adult: Secondary | ICD-10-CM | POA: Diagnosis not present

## 2016-12-19 ENCOUNTER — Ambulatory Visit (INDEPENDENT_AMBULATORY_CARE_PROVIDER_SITE_OTHER): Payer: Medicare Other | Admitting: Cardiovascular Disease

## 2016-12-19 ENCOUNTER — Encounter: Payer: Self-pay | Admitting: Cardiovascular Disease

## 2016-12-19 VITALS — BP 82/50 | HR 117 | Ht 70.0 in | Wt 227.0 lb

## 2016-12-19 DIAGNOSIS — I951 Orthostatic hypotension: Secondary | ICD-10-CM

## 2016-12-19 DIAGNOSIS — I480 Paroxysmal atrial fibrillation: Secondary | ICD-10-CM

## 2016-12-19 DIAGNOSIS — IMO0002 Reserved for concepts with insufficient information to code with codable children: Secondary | ICD-10-CM

## 2016-12-19 DIAGNOSIS — E1140 Type 2 diabetes mellitus with diabetic neuropathy, unspecified: Secondary | ICD-10-CM

## 2016-12-19 DIAGNOSIS — I10 Essential (primary) hypertension: Secondary | ICD-10-CM

## 2016-12-19 DIAGNOSIS — E782 Mixed hyperlipidemia: Secondary | ICD-10-CM

## 2016-12-19 DIAGNOSIS — E1165 Type 2 diabetes mellitus with hyperglycemia: Secondary | ICD-10-CM

## 2016-12-19 DIAGNOSIS — I714 Abdominal aortic aneurysm, without rupture, unspecified: Secondary | ICD-10-CM

## 2016-12-19 DIAGNOSIS — J449 Chronic obstructive pulmonary disease, unspecified: Secondary | ICD-10-CM

## 2016-12-19 DIAGNOSIS — Z720 Tobacco use: Secondary | ICD-10-CM

## 2016-12-19 DIAGNOSIS — Z7189 Other specified counseling: Secondary | ICD-10-CM

## 2016-12-19 MED ORDER — AMIODARONE HCL 200 MG PO TABS: 200.0000 mg | ORAL_TABLET | Freq: Every day | ORAL | 3 refills | Status: DC
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
## 2016-12-19 NOTE — Patient Instructions (Addendum)
Medication Instructions:   Please take amiodarone 2 of the 200 mg pills (400 mg) twice a day for 5 days  When heart rate drops down to 60 bpm, Decrease amiodarone down to 200 mg daily  Call the office heart rate does not slow down soon  Labwork:  No new labs needed  Testing/Procedures:  No further testing at this time   I recommend watching educational videos on topics of interest to you at:       [email or web address]  Enter code: [email or web address]    Follow-Up: It was a pleasure seeing you in the office today. Please call us if you have new issues that need to be addressed before your next appt.  [phone]  Your physician wants you to follow-up in: 6 months.  You will receive a reminder letter in the mail two months in advance. If you don't receive a letter, please call our office to schedule the follow-up appointment.  If you need a refill on your cardiac medications before your next appointment, please call your pharmacy.

## 2016-12-20 ENCOUNTER — Encounter: Payer: Self-pay | Admitting: Family Medicine

## 2016-12-22 ENCOUNTER — Telehealth: Payer: Self-pay | Admitting: Cardiovascular Disease

## 2016-12-22 NOTE — Telephone Encounter (Signed)
Please advise patient questions on instruction for amiodarone.

## 2016-12-22 NOTE — Telephone Encounter (Signed)
Spoke w/ pt's wife [name].  Clarified amiodarone instructions:  "Please take amiodarone 2 of the 200 mg pills (400 mg) twice a day for 5 days"  She is appreciative and will call back w/ any further questions or concerns.

## 2016-12-22 NOTE — Telephone Encounter (Signed)
Pt c/o medication issue:  1. Name of Medication: Amiodarone  2. How are you currently taking this medication (dosage and times per day)? He has taken two of the medication this morning. 200 mg each   3. Are you having a reaction (difficulty breathing--STAT)? No   4. What is your medication issue?  Needs to know total mg patient is to take for the first five days Is it 400 mg a day or more than that Would like to verify  Please advise.

## 2016-12-29 ENCOUNTER — Other Ambulatory Visit: Payer: Self-pay | Admitting: Family Medicine

## 2017-01-06 ENCOUNTER — Other Ambulatory Visit: Payer: Self-pay

## 2017-01-06 MED ORDER — HYDROCODONE-ACETAMINOPHEN 10-325 MG PO TABS: 1.0000 | ORAL_TABLET | Freq: Two times a day (BID) | ORAL | 0 refills | Status: DC | PRN

## 2017-01-06 NOTE — Telephone Encounter (Signed)
[name] left v/m requesting rx hydrocodone apap. Call when ready for pick up. Last printed # [phone] on 12/09/16. Pt last seen 12/09/16.

## 2017-01-06 NOTE — Telephone Encounter (Signed)
Printed and in Kim's box 

## 2017-01-07 NOTE — Telephone Encounter (Signed)
Left detailed message, rx ready for pick up. Placed at front desk for pick up.

## 2017-01-13 ENCOUNTER — Telehealth: Payer: Self-pay | Admitting: Family Medicine

## 2017-01-13 NOTE — Telephone Encounter (Signed)
TH scheduled appt 01/15/17 at 1 pm with Dr Darnell Level.

## 2017-01-13 NOTE — Telephone Encounter (Signed)
Will see then. 

## 2017-01-13 NOTE — Telephone Encounter (Signed)
Patient Name: [name] DOB: 01-03-1946 Initial Comment Caller says, wants an appt today, breathing problems since having a surgery , difficulty walking short distance without getting out of breath Nurse Assessment Nurse: [name], RN, Kermit Balo Date/Time (Eastern Time): 01/13/2017 9:50:28 AM Confirm and document reason for call. If symptomatic, describe symptoms. ---Caller says wants an appt. today, breathing problems since having a surgery - aneurysm, stents put in Oct. He is having difficulty walking short distance without getting out of breath. Had to sleep on back with head elevated last night. Does the patient have any new or worsening symptoms? ---Yes Will a triage be completed? ---Yes Related visit to physician within the last 2 weeks? ---No Does the PT have any chronic conditions? (i.e. diabetes, asthma, etc.) ---Yes List chronic conditions. ---recent surgery, aneurysm, heart issues, others Is this a behavioral health or substance abuse call? ---No Guidelines Guideline Title Affirmed Question Affirmed Notes COPD Oxygen Monitoring and Hypoxia [1] MILD difficulty breathing (e.g., minimal/no SOB at rest, SOB with walking) AND [2] worse than normal Final Disposition User See Physician within [place], RN, [employer] states he feels great now. Has had lower than usual heart rate, in the 50's-60's, feels better when it is in the 70's. Has a long distance run tomorrow, can't be seen until Thursday. States O2 sats. are in high 90's, but he feels winded still. Referrals REFERRED TO PCP OFFICE Disagree/Comply: Comply

## 2017-01-14 NOTE — Telephone Encounter (Addendum)
No appts today - will need to reschedule or seek urgent care. I am not here this afternoon.

## 2017-01-14 NOTE — Telephone Encounter (Signed)
LMOM for patient to keep appt or if he is still having complication for him to go to an urgent care

## 2017-01-14 NOTE — Telephone Encounter (Signed)
Pt called and has to go out of town tomorrow, wanted to r/s appointment for SOB to today.  No appointments at our office.  Please advise.  Best number to call pt is [phone]

## 2017-01-15 ENCOUNTER — Ambulatory Visit: Payer: Self-pay | Admitting: Family Medicine

## 2017-01-15 ENCOUNTER — Other Ambulatory Visit: Payer: Self-pay | Admitting: Family Medicine

## 2017-01-20 ENCOUNTER — Telehealth: Payer: Self-pay | Admitting: Family Medicine

## 2017-01-20 NOTE — Telephone Encounter (Signed)
Pt has appt with Dr Darnell Level on 01/23/17 at 11:15.

## 2017-01-20 NOTE — Telephone Encounter (Signed)
[facility] Patient Name: [name] DOB: 19-Jun-1946 Initial Comment Caller states, He was walking on ramp and he fell on his back and head. He has a concussion - pain when sneezing. Verified Nurse Assessment Nurse: Dimas Chyle, RN, Dellis Filbert Date/Time Eilene Ghazi Time): 01/20/2017 8:48:43 AM Confirm and document reason for call. If symptomatic, describe symptoms. ---Caller states, He was walking on ramp and he fell on his back and head. He has a concussion - pain when sneezing. Fell on Sunday morning. Does the patient have any new or worsening symptoms? ---Yes Will a triage be completed? ---Yes Related visit to physician within the last 2 weeks? ---No Does the PT have any chronic conditions? (i.e. diabetes, asthma, etc.) ---Yes List chronic conditions. ---HTN, CAD, COPD, sleep apnea Is this a behavioral health or substance abuse call? ---No Guidelines Guideline Title Affirmed Question Affirmed Notes Head Injury [1] After 72 hours AND [2] headache persists Final Disposition User See PCP When Office is Open (within 3 days) Dimas Chyle, RN, Dellis Filbert Comments Caller reports that he already has appointment scheduled for 11:15 a.m. on 01/23/17 with PCP. He is currently out of town. Referrals REFERRED TO PCP OFFICE Disagree/Comply: Comply

## 2017-01-20 NOTE — Telephone Encounter (Signed)
Will see then.  Would encourage sooner eval if persistent headache as he's on coumadin to r/o head bleed.

## 2017-01-21 ENCOUNTER — Ambulatory Visit (INDEPENDENT_AMBULATORY_CARE_PROVIDER_SITE_OTHER): Payer: Medicare Other

## 2017-01-21 ENCOUNTER — Emergency Department
Admission: EM | Admit: 2017-01-21 | Discharge: 2017-01-21 | Disposition: A | Discharge status: Home / Self Care | Payer: Medicare Other | Attending: Emergency Medicine | Admitting: Emergency Medicine

## 2017-01-21 ENCOUNTER — Emergency Department: Payer: Medicare Other

## 2017-01-21 DIAGNOSIS — W19XXXA Unspecified fall, initial encounter: Secondary | ICD-10-CM

## 2017-01-21 DIAGNOSIS — I509 Heart failure, unspecified: Secondary | ICD-10-CM | POA: Diagnosis not present

## 2017-01-21 DIAGNOSIS — S199XXA Unspecified injury of neck, initial encounter: Secondary | ICD-10-CM | POA: Diagnosis not present

## 2017-01-21 DIAGNOSIS — Y9301 Activity, walking, marching and hiking: Secondary | ICD-10-CM | POA: Insufficient documentation

## 2017-01-21 DIAGNOSIS — Z7984 Long term (current) use of oral hypoglycemic drugs: Secondary | ICD-10-CM | POA: Insufficient documentation

## 2017-01-21 DIAGNOSIS — J449 Chronic obstructive pulmonary disease, unspecified: Secondary | ICD-10-CM | POA: Insufficient documentation

## 2017-01-21 DIAGNOSIS — I4891 Unspecified atrial fibrillation: Secondary | ICD-10-CM | POA: Diagnosis not present

## 2017-01-21 DIAGNOSIS — E119 Type 2 diabetes mellitus without complications: Secondary | ICD-10-CM | POA: Insufficient documentation

## 2017-01-21 DIAGNOSIS — Y92415 Exit ramp or entrance ramp of street or highway as the place of occurrence of the external cause: Secondary | ICD-10-CM | POA: Insufficient documentation

## 2017-01-21 DIAGNOSIS — F17210 Nicotine dependence, cigarettes, uncomplicated: Secondary | ICD-10-CM | POA: Diagnosis not present

## 2017-01-21 DIAGNOSIS — S0081XA Abrasion of other part of head, initial encounter: Secondary | ICD-10-CM | POA: Insufficient documentation

## 2017-01-21 DIAGNOSIS — Y999 Unspecified external cause status: Secondary | ICD-10-CM | POA: Insufficient documentation

## 2017-01-21 DIAGNOSIS — Z79899 Other long term (current) drug therapy: Secondary | ICD-10-CM | POA: Diagnosis not present

## 2017-01-21 DIAGNOSIS — S0990XA Unspecified injury of head, initial encounter: Secondary | ICD-10-CM | POA: Diagnosis not present

## 2017-01-21 DIAGNOSIS — W0110XA Fall on same level from slipping, tripping and stumbling with subsequent striking against unspecified object, initial encounter: Secondary | ICD-10-CM | POA: Diagnosis not present

## 2017-01-21 DIAGNOSIS — M542 Cervicalgia: Secondary | ICD-10-CM | POA: Diagnosis not present

## 2017-01-21 DIAGNOSIS — R51 Headache: Secondary | ICD-10-CM | POA: Diagnosis not present

## 2017-01-21 LAB — PROTIME-INR
INR: 1.49
PROTHROMBIN TIME: 18.2 s — AB (ref 11.4–15.2)

## 2017-01-21 LAB — POCT INR: INR: 1.6

## 2017-01-21 NOTE — ED Triage Notes (Signed)
Pt states he slipped and fell on a wheelchair ramp Sunday and hit his head.. Denies LOC. Pt states he takes coumadin and has had a HA since.

## 2017-01-21 NOTE — ED Provider Notes (Signed)
[facility] Emergency Department Provider Note   ____________________________________________   First MD Initiated Contact with Patient 01/21/17 [phone]     (approximate)  I have reviewed the triage vital signs and the nursing notes.   HISTORY  Chief Complaint Fall and Head Injury    HPI [name] is a 71 y.o. male  who reports he was walking down handicap ramp outside of his house in the rain and slipped and fell and hit his head. He did not pass out. He had to drive to [place] for work so he did so but he's been having a headache ever since especially if he coughs or sneezes. He has to hold his head. Patient called her doctor today and the nurse told him to come to the hospital for head CT. Patient also takes Coumadin. I will check his Coumadin level as that is also due. Patient reports he is otherwise as healthy as usual.       Past Medical History:  Diagnosis Date  . AAA (abdominal aortic aneurysm) ([place]) 2013   s/p stent graft repair now with supra/pararenal aneurysm 3.5cm, referred to Dr. Sammuel Hines at [facility] for endovascular repair (12/2013)  . Abnormal drug screen 06/2015   see problem list  . Cervical neck pain with evidence of disc disease 07/2011   MRI - disk bulging and foraminal stenosis, advanced at C4/5, 5/6; rec pain management for ESI by Dr. Mack Guise   . CHF (congestive heart failure) ([place])   . COPD (chronic obstructive pulmonary disease) (HCC)    mod-severe COPD/emphysema.  PFTs 12/2010.  He still smokes 1 ppd.  . Depression   . ED (erectile dysfunction) 02/2012   penile injections - failed viagra, poor arterial flow ([place])  . Fatty liver   . GERD (gastroesophageal reflux disease)   . Hyperlipidemia    myalgias with simvastatin and atorvastatin  . Leg cramps    idiopathic severe  . Muscle spasm    chronic  . Neuralgia    pain in hands. L>R from accident  . Obesity   . OSA (obstructive sleep apnea) 03/2012   AHI 18.6, desat to 74%,  severe snoring, consider ENT eval  . Osteoarthritis   . Paroxysmal atrial fibrillation (HCC)    on coumadin only.  . Peripheral autonomic neuropathy due to diabetes mellitus ([place])   . Right shoulder injury 05/2012   after fall out of chair, s/p injection, rec conservative management with PT Noemi Chapel)  . Smoker    1ppd  . T2DM (type 2 diabetes mellitus) [facility])     Patient Active Problem List   Diagnosis Date Noted  . Encounter for anticoagulation discussion and counseling 12/19/2016  . Encounter for chronic pain management 12/09/2016  . Generalized abdominal pain 07/16/2016  . 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 Enlarged prostate 07/16/2016  . Current use of long term anticoagulation 06/30/2016  . Chronic pain syndrome 03/07/2016  . Right shoulder pain 01/03/2016  . Low back pain 12/18/2015  . Left knee pain 12/18/2015  . Weight loss 06/08/2015  . Abnormal drug screen 06/08/2015  . Olecranon bursitis of left elbow 09/01/2014  . Bradycardia 06/23/2014  . Diarrhea 04/24/2013  . DDD (degenerative disc disease), cervical 04/03/2012  . OSA (obstructive sleep apnea) 03/08/2012  . AAA (abdominal aortic aneurysm) without rupture ([place]) 10/22/2011  . 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/20/2011  . Type 2 diabetes, uncontrolled, with neuropathy ([place]) 02/27/2011  . Polyneuropathy in diabetes(357.2) 02/27/2011  . DYSPNEA  ON EXERTION 12/30/2010  . PERS HX NONCOMPLIANCE W/MED TX PRS HAZARDS HLTH 11/28/2010  . RESTRICTIVE LUNG DISEASE 10/17/2010  . INSOMNIA 10/17/2010  . Hyperlipidemia 09/19/2010  . ABNORMAL ELECTROCARDIOGRAM 09/19/2010  . Atrial fibrillation ([place]) 09/18/2010  . PARESTHESIA, HANDS 09/18/2010  . ARTHRALGIA-JOINT PAIN 07/14/2010  . Esophageal reflux 04/21/2010  . LEG CRAMPS, IDIOPATHIC 04/21/2010  . Fatty liver 04/21/2010  . Tobacco abuse 03/28/2010  . HEMATURIA, HX OF 03/28/2010  . COPD mixed type ([place]) 03/21/2010    Past Surgical History:    Procedure Laterality Date  . CHOLECYSTECTOMY  2001  . CTA abd  09/2011   6.1cm AAA, bilateral ing hernias, R with bladder wall, promient prostate calcifications  . ENDOVASCULAR STENT INSERTION  11/11/2011   Procedure: ENDOVASCULAR STENT GRAFT INSERTION;  Surgeon: Angelia Mould, MD;  Location: [place];  Service: Vascular;  Laterality: N/A;  aorta bi iliac  . KNEE SURGERY     L side cartilage taken out  . PFTs  12/2010   mod-severe obstruction, ?bronchodilator response  . TONSILLECTOMY      Prior to Admission medications   Medication Sig Start Date End Date Taking? Authorizing Provider  amiodarone (PACERONE) 200 MG tablet Take 1 tablet (200 mg total) by mouth daily. 12/19/16   Minna Merritts, MD  budesonide-formoterol (SYMBICORT) 160-4.5 MCG/ACT inhaler Inhale 1 puff into the lungs 2 (two) times daily.    Historical Provider, MD  Coenzyme Q10 100 MG capsule Take 1 capsule (100 mg total) by mouth daily. 03/25/11   Minna Merritts, MD  diclofenac sodium (VOLTAREN) 1 % GEL Apply 2 g topically 4 (four) times daily.    Historical Provider, MD  diphenoxylate-atropine (LOMOTIL) 2.5-0.025 MG tablet Take 1 tablet by mouth 2 (two) times daily as needed for diarrhea or loose stools. 09/26/16   Ria Bush, MD  docusate sodium (COLACE) 100 MG capsule Take 100 mg by mouth 2 (two) times daily.    Historical Provider, MD  fish oil-omega-3 fatty acids 1000 MG capsule Take 2 g by mouth daily.     Historical Provider, MD  fluticasone (FLONASE) 50 MCG/ACT nasal spray Place 2 sprays into both nostrils daily.    Historical Provider, MD  fluticasone furoate-vilanterol (BREO ELLIPTA) 200-25 MCG/INH AEPB Inhale 1 puff into the lungs daily. 06/30/16   Ria Bush, MD  furosemide (LASIX) 20 MG tablet TAKE 1 TABLET BY MOUTH AS NEEDED FOR FLUID 01/07/16   Ria Bush, MD  furosemide (LASIX) 20 MG tablet TAKE 1 TABLET BY MOUTH AS NEEDED FOR FLUID 01/15/17   Ria Bush, MD  gabapentin (NEURONTIN)  600 MG tablet TAKE 1 TABLET BY MOUTH THREE TIMES DAILY 12/04/16   Ria Bush, MD  glucose blood (ONE TOUCH ULTRA TEST) test strip Check blood sugar once daily and as needed. 250.60 04/23/12   Ria Bush, MD  Glucose Blood (ONETOUCH ULTRA BLUE VI) by In Vitro route. Test blood glucose 1 time per day and as directed.     Historical Provider, MD  HYDROcodone-acetaminophen (NORCO) 10-325 MG tablet Take 1 tablet by mouth 2 (two) times daily as needed for moderate pain. 01/06/17   Ria Bush, MD  JANUVIA 50 MG tablet TAKE 1 TABLET(50 MG) BY MOUTH DAILY 10/22/16   Ria Bush, MD  metFORMIN (GLUCOPHAGE) 500 MG tablet TAKE 1 TABLET BY MOUTH EVERY MORNING AND 2 TABLETS EVERY EVENING Patient taking differently: TAKE 2 TABLET BY MOUTH EVERY MORNING AND 2 TABLETS EVERY EVENING 07/17/16   Ria Bush, MD  nitroGLYCERIN (  NITROLINGUAL) 0.4 MG/SPRAY spray Place 1 spray under the tongue every 5 (five) minutes as needed. 02/29/16   Minna Merritts, MD  omeprazole (PRILOSEC) 40 MG capsule TAKE 1 CAPSULE BY MOUTH EVERY DAY 09/09/16   Ria Bush, MD  omeprazole (PRILOSEC) 40 MG capsule TAKE 1 CAPSULE BY MOUTH EVERY DAY 12/29/16   Ria Bush, MD  potassium chloride (K-DUR,KLOR-CON) 10 MEQ tablet TAKE 2 TABLETS BY MOUTH TWICE DAILY 09/01/16   Minna Merritts, MD  PROAIR HFA 108 [phone] Base) MCG/ACT inhaler INHALE 2 PUFFS BY MOUTH EVERY 6 HOURS AS NEEDED FOR WHEEZING OR SHORTNESS OF BREATH 04/07/16   Ria Bush, MD  tiZANidine (ZANAFLEX) 4 MG tablet Take 1 tablet (4 mg total) by mouth 2 (two) times daily as needed for muscle spasms. 12/09/16   Ria Bush, MD  warfarin (COUMADIN) 5 MG tablet TAKE AS DIRECTED BY COUMADIN CLINIC 10/22/16   Minna Merritts, MD    Allergies Varenicline tartrate; Wellbutrin [bupropion hcl]; and Zocor [simvastatin]  Family History  Problem Relation Age of Onset  . Heart disease Father   . Leukemia Father   . Coronary artery disease Father   .  Melanoma Sister   . Arthritis Mother   . Diabetes Neg Hx   . Stroke Neg Hx     Social History Social History  Substance Use Topics  . Smoking status: Current Every Day Smoker    Packs/day: 1.50    Years: 52.00    Types: Cigarettes  . Smokeless tobacco: Never Used  . Alcohol use 0.0 oz/week     Comment: occasional, 1 glass margarita or glass of wine once every 6 months; occasional beer with tomato juice    Review of Systems Constitutional: No fever/chills Eyes: No visual changes. ENT: No sore throat. Cardiovascular: Denies chest pain. Respiratory: Denies shortness of breath. Gastrointestinal: No abdominal pain.  No nausea, no vomiting.  No diarrhea.  No constipation. Genitourinary: Negative for dysuria. Musculoskeletal: Negative for back pain. Skin: Negative for rash. Neurological: Negative for new focal weakness or numbness. (Patient has had numbness of the right arm for some time due to DJD of the neck, that has not changed)  10-point ROS otherwise negative.  ____________________________________________   PHYSICAL EXAM:  VITAL SIGNS: ED Triage Vitals  Enc Vitals Group     BP 01/21/17 0724 (!) 149/44     Pulse Rate 01/21/17 0724 (!) 54     Resp 01/21/17 0724 18     Temp 01/21/17 0724 97.7 F (36.5 C)     Temp Source 01/21/17 0724 Oral     SpO2 01/21/17 0724 96 %     Weight 01/21/17 0724 225 lb (102.1 kg)     Height 01/21/17 0724 5\' 9"  (1.753 m)     Head Circumference --      Peak Flow --      Pain Score 01/21/17 0741 3     Pain Loc --      Pain Edu? --      Excl. in [place]? --     Constitutional: Alert and oriented. Well appearing and in no acute distress. Eyes: Conjunctivae are normal. PERRL. EOMI. Head: AtraumaticStaff small abrasion on the vertex.. Nose: No congestion/rhinnorhea. Mouth/Throat: Mucous membranes are moist.  Oropharynx non-erythematous. Neck: No stridor Cardiovascular: Normal rate, regular rhythm. Grossly normal heart sounds.  Good  peripheral circulation. Respiratory: Normal respiratory effort.  No retractions. Lungs CTAB. Gastrointestinal: Soft and nontender. No distention. No abdominal bruits. No CVA tenderness. Musculoskeletal: No  lower extremity tenderness nor edema.  No joint effusions. Neurologic:  Normal speech and language. No gross focal neurologic deficits are appreciated. No gait instability. Skin:  Skin is warm, dry and intact. No rash noted. Psychiatric: Mood and affect are normal. Speech and behavior are normal.  ____________________________________________   LABS (all labs ordered are listed, but only abnormal results are displayed)  Labs Reviewed  PROTIME-INR - Abnormal; Notable for the following:       Result Value   Prothrombin Time 18.2 (*)    All other components within normal limits   ____________________________________________  EKG   ____________________________________________  RADIOLOGY  Study Result   CLINICAL DATA:  Pain following fall  EXAM: CT HEAD WITHOUT CONTRAST  CT CERVICAL SPINE WITHOUT CONTRAST  TECHNIQUE: Multidetector CT imaging of the head and cervical spine was performed following the standard protocol without intravenous contrast. Multiplanar CT image reconstructions of the cervical spine were also generated.  COMPARISON:  None.  FINDINGS: CT HEAD FINDINGS  Brain: The ventricles are normal in size and configuration. There is mild frontal atrophy bilaterally. There is no appreciable intracranial mass, hemorrhage, extra-axial fluid collection, or midline shift. There is slight small vessel disease in the centra semiovale bilaterally. Elsewhere gray-white compartments appear normal. No evident acute infarct.  Vascular: No hyperdense vessel. There is calcification in both distal vertebral arteries. There is also calcification in each carotid siphon region.  Skull: The bony calvarium appears intact.  Sinuses/Orbits: There is mild mucosal  thickening in several ethmoid air cells bilaterally. Other visualized paranasal sinuses are clear. Visualized orbits are symmetric bilaterally.  Other: Mastoid air cells are clear.  CT CERVICAL SPINE FINDINGS  Alignment: There is mild cervical levoscoliosis. There is minimal retrolisthesis of C4 on C5. There is approximately 3 mm of retrolisthesis of C5 on C6. No other spondylolisthesis.  Skull base and vertebrae: Craniocervical junction and skull base regions appear normal. There is no demonstrable fracture. There are no blastic or lytic bone lesions.  Soft tissues and spinal canal: Prevertebral soft tissues and predental space regions are normal. There is no paraspinous lesion. No cord or canal hematoma is evident. There is moderate spinal stenosis at C5-6 and C6-7 due to bony hypertrophy and disc protrusion.  Disc levels: There is moderately severe disc space narrowing at C4-5, C5-6, and C6-7. There is milder narrowing at C7-T1. There is facet hypertrophy at most levels bilaterally. There is moderate exit foraminal narrowing due to bony hypertrophy at C2-3 on the right, at C4-5 on the left, at C5-6 bilaterally, and at C6-7 bilaterally, somewhat more severe on the left than on the right. There is impression of exiting nerve roots at most of these levels. There is broad-based disc protrusion at C6-7. No disc extrusion is appreciable on this study.  Upper chest: Visualized lung apices are clear.  Other: There is calcification in each carotid artery.  IMPRESSION: CT head: Frontal atrophy bilaterally. Mild periventricular small vessel disease. No intracranial mass, hemorrhage, or extra-axial fluid collection. No evidence of acute infarct. Foci of arterial vascular calcification noted bilaterally. There is mild ethmoid sinus disease bilaterally.  CT cervical spine: No fracture. Areas of mild spondylolisthesis are felt to be due to underlying spondylosis. There is  multilevel arthropathy. There is moderate spinal stenosis at C5-6 and C6-7 due to bony hypertrophy and disc protrusion. Exit foraminal narrowing is noted at multiple levels with impression on several exiting nerve roots bilaterally. There is atherosclerotic calcification in each carotid artery.   Electronically Signed  By: Lowella Grip III M.D.   On: 01/21/2017 08:26     ____________________________________________   PROCEDURES  Procedure(s) performed:   Procedures  Critical Care performed:   ____________________________________________   INITIAL IMPRESSION / ASSESSMENT AND PLAN / ED COURSE  Pertinent labs & imaging results that were available during my care of the patient were reviewed by me and considered in my medical decision making (see chart for details).        ____________________________________________   FINAL CLINICAL IMPRESSION(S) / ED DIAGNOSES  Final diagnoses:  Fall, initial encounter      NEW MEDICATIONS STARTED DURING THIS VISIT:  New Prescriptions   No medications on file     Note:  This document was prepared using Dragon voice recognition software and may include unintentional dictation errors.    Nena Polio, MD 01/21/17 [phone]

## 2017-01-21 NOTE — Telephone Encounter (Signed)
Patient notified. He actually went to ER this AM and had a CT done which was negative.

## 2017-01-21 NOTE — ED Notes (Signed)
Patient transported to CT 

## 2017-01-21 NOTE — Discharge Instructions (Signed)
Please follow-up with your regular doctor. Have them review the neck CT findings are some narrowing of the spine there that possibly could be fixed. Also your INR is 1.49 today this is somewhat low I would contact your doctor to see how much extra Coumadin he wants to take.

## 2017-01-23 ENCOUNTER — Encounter: Payer: Self-pay | Admitting: Family Medicine

## 2017-01-23 ENCOUNTER — Ambulatory Visit (INDEPENDENT_AMBULATORY_CARE_PROVIDER_SITE_OTHER): Payer: Medicare Other | Admitting: Family Medicine

## 2017-01-23 VITALS — BP 118/66 | HR 51 | Temp 98.1°F | Wt 230.5 lb

## 2017-01-23 DIAGNOSIS — J449 Chronic obstructive pulmonary disease, unspecified: Secondary | ICD-10-CM | POA: Diagnosis not present

## 2017-01-23 DIAGNOSIS — Z7189 Other specified counseling: Secondary | ICD-10-CM

## 2017-01-23 DIAGNOSIS — M71342 Other bursal cyst, left hand: Secondary | ICD-10-CM | POA: Diagnosis not present

## 2017-01-23 DIAGNOSIS — S0990XD Unspecified injury of head, subsequent encounter: Secondary | ICD-10-CM | POA: Diagnosis not present

## 2017-01-23 DIAGNOSIS — R0609 Other forms of dyspnea: Secondary | ICD-10-CM | POA: Diagnosis not present

## 2017-01-23 DIAGNOSIS — Z720 Tobacco use: Secondary | ICD-10-CM | POA: Diagnosis not present

## 2017-01-23 DIAGNOSIS — S0990XA Unspecified injury of head, initial encounter: Secondary | ICD-10-CM | POA: Insufficient documentation

## 2017-01-23 MED ORDER — FLUTICASONE-SALMETEROL 250-50 MCG/DOSE IN AEPB: 1.0000 | INHALATION_SPRAY | Freq: Two times a day (BID) | RESPIRATORY_TRACT | 6 refills | Status: DC

## 2017-01-23 NOTE — Assessment & Plan Note (Signed)
Current smoker. He is overusing proair. Discussed this. He states advair was what worked best for him, not using breo or symbicort due to cost. Will refill advair for patient to price out, but if unaffordable encouraged he restart cheaper alternatives.

## 2017-01-23 NOTE — Assessment & Plan Note (Signed)
Continue to encourage cessation. precontemplative. 

## 2017-01-23 NOTE — Progress Notes (Signed)
Pre visit review using our clinic review tool, if applicable. No additional management support is needed unless otherwise documented below in the visit note. 

## 2017-01-23 NOTE — Patient Instructions (Addendum)
We will refer you to hand doctor for evaluation of hand cyst.  Headache should continue to improve each day. Let us know if worsening symptoms.  Work on smoking cessation.  Price out advair. If too expensive, restart breo or symbicort (whichever is cheaper).  Return in 4 months for follow up visit

## 2017-01-23 NOTE — Assessment & Plan Note (Addendum)
Reviewed recent ER evaluation with reassuring head CT and neck CT. According to patient, headache is improving each day. Anticipate continued improvement, discussed with patient.

## 2017-01-23 NOTE — Assessment & Plan Note (Signed)
Has returned to coumadin clinic.

## 2017-01-23 NOTE — Assessment & Plan Note (Signed)
Ongoing, but pt endorses good o2 sats despite dyspnea. Anticipate multifactorial but large contribution of COPD in ongoing smoker. See below.

## 2017-01-23 NOTE — Progress Notes (Signed)
BP 118/66   Pulse (!) 51   Temp 98.1 F (36.7 C) (Oral)   Wt 230 lb 8 oz (104.6 kg)   SpO2 97%   BMI 34.04 kg/m    CC: headache Subjective:    Patient ID: [name], male    DOB: 11/14/1946, 71 y.o.   MRN: [id number]  HPI: [name] is a 71 y.o. male presenting on 01/23/2017 for Headache (when sneezing)   Recent fall Sunday morning slipped at home while walking down handicap ramp, hit head and neck, drove down to [place]. However given persistent headache, decided to go to ER 2/14 with reassuring head CT and cervical neck CT. Headache worse with sneezing, or coughing. Headache slowly improving. Denies vision changes, confusion.   Ongoing intermittent dyspnea with exertion with wheezing, but without chest pain or dizziness. Does not use controller medication. Finds breo and symbicort too expensive. Requests refill of advair which seemed to work better.   Smoking - down to 1 ppd.   Saw coumadin clinic 2d ago - has f/u planned in a few weeks.   Enlarging left palm skin cyst. Not tender or itchy. Wants removed. He has cut it down himself in the past but it recurs.   He does take hydrocodone, gabapentin, tylenol.   Relevant past medical, surgical, family and social history reviewed and updated as indicated. Interim medical history since our last visit reviewed. Allergies and medications reviewed and updated. Current Outpatient Prescriptions on File Prior to Visit  Medication Sig  . amiodarone (PACERONE) 200 MG tablet Take 1 tablet (200 mg total) by mouth daily.  . budesonide-formoterol (SYMBICORT) 160-4.5 MCG/ACT inhaler Inhale 1 puff into the lungs 2 (two) times daily.  . Coenzyme Q10 100 MG capsule Take 1 capsule (100 mg total) by mouth daily.  . diclofenac sodium (VOLTAREN) 1 % GEL Apply 2 g topically 4 (four) times daily.  . diphenoxylate-atropine (LOMOTIL) 2.5-0.025 MG tablet Take 1 tablet by mouth 2 (two) times daily as needed for diarrhea or loose stools.  . docusate  sodium (COLACE) 100 MG capsule Take 100 mg by mouth 2 (two) times daily.  . fish oil-omega-3 fatty acids 1000 MG capsule Take 2 g by mouth daily.   . fluticasone (FLONASE) 50 MCG/ACT nasal spray Place 2 sprays into both nostrils daily.  . fluticasone furoate-vilanterol (BREO ELLIPTA) 200-25 MCG/INH AEPB Inhale 1 puff into the lungs daily.  . furosemide (LASIX) 20 MG tablet TAKE 1 TABLET BY MOUTH AS NEEDED FOR FLUID  . gabapentin (NEURONTIN) 600 MG tablet TAKE 1 TABLET BY MOUTH THREE TIMES DAILY  . glucose blood (ONE TOUCH ULTRA TEST) test strip Check blood sugar once daily and as needed. 250.60  . Glucose Blood (ONETOUCH ULTRA BLUE VI) by In Vitro route. Test blood glucose 1 time per day and as directed.   Marland Kitchen HYDROcodone-acetaminophen (NORCO) 10-325 MG tablet Take 1 tablet by mouth 2 (two) times daily as needed for moderate pain.  Marland Kitchen JANUVIA 50 MG tablet TAKE 1 TABLET(50 MG) BY MOUTH DAILY  . metFORMIN (GLUCOPHAGE) 500 MG tablet TAKE 1 TABLET BY MOUTH EVERY MORNING AND 2 TABLETS EVERY EVENING (Patient taking differently: TAKE 2 TABLET BY MOUTH EVERY MORNING AND 2 TABLETS EVERY EVENING)  . nitroGLYCERIN (NITROLINGUAL) 0.4 MG/SPRAY spray Place 1 spray under the tongue every 5 (five) minutes as needed.  Marland Kitchen omeprazole (PRILOSEC) 40 MG capsule TAKE 1 CAPSULE BY MOUTH EVERY DAY  . potassium chloride (K-DUR,KLOR-CON) 10 MEQ tablet TAKE 2 TABLETS BY  MOUTH TWICE DAILY  . PROAIR HFA 108 (90 Base) MCG/ACT inhaler INHALE 2 PUFFS BY MOUTH EVERY 6 HOURS AS NEEDED FOR WHEEZING OR SHORTNESS OF BREATH  . tiZANidine (ZANAFLEX) 4 MG tablet Take 1 tablet (4 mg total) by mouth 2 (two) times daily as needed for muscle spasms.  Marland Kitchen warfarin (COUMADIN) 5 MG tablet TAKE AS DIRECTED BY COUMADIN CLINIC   No current facility-administered medications on file prior to visit.     Review of Systems Per HPI unless specifically indicated in ROS section     Objective:    BP 118/66   Pulse (!) 51   Temp 98.1 F (36.7 C) (Oral)    Wt 230 lb 8 oz (104.6 kg)   SpO2 97%   BMI 34.04 kg/m   Wt Readings from Last 3 Encounters:  01/23/17 230 lb 8 oz (104.6 kg)  01/21/17 225 lb (102.1 kg)  12/19/16 227 lb (103 kg)    Physical Exam  Constitutional: He appears well-developed and well-nourished. No distress.  HENT:  Head: Normocephalic and atraumatic.  Mouth/Throat: Oropharynx is clear and moist. No oropharyngeal exudate.  No scalp bruising or goose egg on head  Eyes: Conjunctivae and EOM are normal. Pupils are equal, round, and reactive to light. No scleral icterus.  Cardiovascular: Normal rate, regular rhythm, normal heart sounds and intact distal pulses.   No murmur heard. Pulmonary/Chest: Effort normal and breath sounds normal. No respiratory distress. He has no wheezes. He has no rales.  Coarse throughout  Musculoskeletal: He exhibits no edema.       Hands: Large mobile L palm skin cyst at tissue between thumb and forefinger  Skin: Skin is warm and dry. No rash noted.  Psychiatric: He has a normal mood and affect.  Nursing note and vitals reviewed.  Results for orders placed or performed in visit on 01/21/17  POCT INR  Result Value Ref Range   INR 1.6    Lab Results  Component Value Date   HGBA1C 8.0 (H) 07/16/2016       Assessment & Plan:   Problem List Items Addressed This Visit    COPD mixed type ([place])    Current smoker. He is overusing proair. Discussed this. He states advair was what worked best for him, not using breo or symbicort due to cost. Will refill advair for patient to price out, but if unaffordable encouraged he restart cheaper alternatives.       Relevant Medications   Fluticasone-Salmeterol (ADVAIR DISKUS) 250-50 MCG/DOSE AEPB   Dyspnea on exertion    Ongoing, but pt endorses good o2 sats despite dyspnea. Anticipate multifactorial but large contribution of COPD in ongoing smoker. See below.       Encounter for anticoagulation discussion and counseling    Has returned to  coumadin clinic.       Head injury - Primary    Reviewed recent ER evaluation with reassuring head CT and neck CT. According to patient, headache is improving each day. Anticipate continued improvement, discussed with patient.       Other bursal cyst, left hand    Anticipate cyst ?ganglion. Refer to hand for further evaluation.       Relevant Orders   Ambulatory referral to Hand Surgery   Tobacco abuse    Continue to encourage cessation. precontemplative          Follow up plan: Return in about 4 months (around 05/23/2017) for follow up visit.  Ria Bush, MD

## 2017-01-23 NOTE — Assessment & Plan Note (Signed)
Anticipate cyst ?ganglion. Refer to hand for further evaluation.

## 2017-02-13 ENCOUNTER — Other Ambulatory Visit: Payer: Self-pay | Admitting: Family Medicine

## 2017-02-24 ENCOUNTER — Other Ambulatory Visit: Payer: Self-pay

## 2017-02-24 NOTE — Telephone Encounter (Signed)
[name] left v/m requesting rx hydrocodone apap. Call when ready for pick up. Last printed # 62 on 01/06/17 and last seen 12/09/16.

## 2017-02-25 NOTE — Telephone Encounter (Signed)
[name] left v/m requesting cb about status of hydrocodone apap.

## 2017-02-26 MED ORDER — HYDROCODONE-ACETAMINOPHEN 10-325 MG PO TABS: 1.0000 | ORAL_TABLET | Freq: Two times a day (BID) | ORAL | 0 refills | Status: DC | PRN

## 2017-02-26 NOTE — Telephone Encounter (Signed)
Message left advising patient's wife and Rx placed up front for pick up. 

## 2017-02-26 NOTE — Telephone Encounter (Signed)
Printed and in Kim's box 

## 2017-03-09 ENCOUNTER — Other Ambulatory Visit: Payer: Self-pay | Admitting: Family Medicine

## 2017-03-11 ENCOUNTER — Other Ambulatory Visit: Payer: Self-pay | Admitting: Family Medicine

## 2017-03-12 NOTE — Telephone Encounter (Signed)
Rx called in as directed.   

## 2017-03-12 NOTE — Telephone Encounter (Signed)
plz phone in. 

## 2017-03-12 NOTE — Telephone Encounter (Signed)
Ok to refill? Last filled 09/26/16 #20 0RF

## 2017-03-14 ENCOUNTER — Other Ambulatory Visit: Payer: Self-pay | Admitting: Family Medicine

## 2017-03-19 ENCOUNTER — Telehealth: Payer: Self-pay | Admitting: Cardiovascular Disease

## 2017-03-19 NOTE — Telephone Encounter (Signed)
Answering service called us,stating patient called two minutes before [place] opening.Marland Kitchen She was sending [place] the message he left with them   Pt calling stating he was having Irregular heartbeat Would like a call back
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
## 2017-03-19 NOTE — Telephone Encounter (Signed)
Patient called in stating that his blood pressure was 88/64 with heart rate of 93. Repeat blood pressure checks were as follows: 97/70 HR 89 98/64 HR 91 110/67 HR 74  Let him know that the first blood pressure was low but that it was reassuring his repeat blood pressures were coming up. He states that his heart rate fluctuations were concerning to him. He states that his normal heart rate is around 59 and that when it starts getting close to 100's he does not feel well and becomes very dizzy. He states that he is concerned about his blood pressures as well because his normal range is systolic 397-673'A to diastolic over the 19'F. Reviewed with him that normal heart range can be from 60-low 100's and that ideally we like blood pressure to be lower than 140/80's. He states that he is not feeling well and just really wants to come in and see Dr. Rockey Situ to discuss his concerns because he is very worried. Checked with scheduling and he will come in to see Dr. Rockey Situ tomorrow morning at 07:40AM. He was appreciative for the call and appointment. Instructed him to call back if symptoms worsen. He verbalized understanding of our conversation, agreement with plan, and had no further questions at this time.

## 2017-03-20 ENCOUNTER — Ambulatory Visit (INDEPENDENT_AMBULATORY_CARE_PROVIDER_SITE_OTHER): Payer: Medicare Other | Admitting: *Deleted

## 2017-03-20 ENCOUNTER — Other Ambulatory Visit: Payer: Self-pay | Admitting: Cardiovascular Disease

## 2017-03-20 ENCOUNTER — Ambulatory Visit (INDEPENDENT_AMBULATORY_CARE_PROVIDER_SITE_OTHER): Payer: Medicare Other | Admitting: Cardiovascular Disease

## 2017-03-20 ENCOUNTER — Encounter: Payer: Self-pay | Admitting: Cardiovascular Disease

## 2017-03-20 VITALS — BP 114/82 | HR 50 | Ht 69.0 in | Wt 233.8 lb

## 2017-03-20 DIAGNOSIS — E1140 Type 2 diabetes mellitus with diabetic neuropathy, unspecified: Secondary | ICD-10-CM

## 2017-03-20 DIAGNOSIS — I480 Paroxysmal atrial fibrillation: Secondary | ICD-10-CM | POA: Diagnosis not present

## 2017-03-20 DIAGNOSIS — E782 Mixed hyperlipidemia: Secondary | ICD-10-CM

## 2017-03-20 DIAGNOSIS — G4733 Obstructive sleep apnea (adult) (pediatric): Secondary | ICD-10-CM

## 2017-03-20 DIAGNOSIS — I714 Abdominal aortic aneurysm, without rupture, unspecified: Secondary | ICD-10-CM

## 2017-03-20 DIAGNOSIS — Z720 Tobacco use: Secondary | ICD-10-CM | POA: Diagnosis not present

## 2017-03-20 DIAGNOSIS — Z7189 Other specified counseling: Secondary | ICD-10-CM | POA: Diagnosis not present

## 2017-03-20 DIAGNOSIS — IMO0002 Reserved for concepts with insufficient information to code with codable children: Secondary | ICD-10-CM

## 2017-03-20 DIAGNOSIS — J449 Chronic obstructive pulmonary disease, unspecified: Secondary | ICD-10-CM

## 2017-03-20 DIAGNOSIS — E1165 Type 2 diabetes mellitus with hyperglycemia: Secondary | ICD-10-CM | POA: Diagnosis not present

## 2017-03-20 LAB — POCT INR: INR: 2.6

## 2017-03-20 MED ORDER — DILTIAZEM HCL 30 MG PO TABS: 30.0000 mg | ORAL_TABLET | ORAL | 0 refills | Status: DC

## 2017-03-20 NOTE — Patient Instructions (Addendum)
Medication Instructions:   When you have fast heart rate >90 bpm Please take amiodarone 2 of the 200 mg pills  Also take diltiazem I pill  If it does not go back into normal rhythm after 6 hours, Take another amio 2 pills and one diltiazem  When heart rate drops down to 60 bpm, Decrease amiodarone down to 200 mg one a day   Labwork:  No new labs needed  Testing/Procedures:  No further testing at this time   I recommend watching educational videos on topics of interest to you at:       [email or web address]  Enter code: [email or web address]    Follow-Up: It was a pleasure seeing you in the office today. Please call us if you have new issues that need to be addressed before your next appt.  [phone]  Your physician wants you to follow-up in: 6 months.  You will receive a reminder letter in the mail two months in advance. If you don't receive a letter, please call our office to schedule the follow-up appointment.  If you need a refill on your cardiac medications before your next appointment, please call your pharmacy.  It was a pleasure seeing you today here in the office. Please do not hesitate to give [place] a call back if you have any further questions. [place], BSN

## 2017-03-23 ENCOUNTER — Encounter: Payer: Self-pay | Admitting: Family Medicine

## 2017-03-23 ENCOUNTER — Ambulatory Visit (INDEPENDENT_AMBULATORY_CARE_PROVIDER_SITE_OTHER): Payer: Medicare Other | Admitting: Family Medicine

## 2017-03-23 VITALS — BP 118/64 | HR 84 | Temp 97.4°F | Wt 237.2 lb

## 2017-03-23 DIAGNOSIS — Z7189 Other specified counseling: Secondary | ICD-10-CM

## 2017-03-23 DIAGNOSIS — R197 Diarrhea, unspecified: Secondary | ICD-10-CM

## 2017-03-23 MED ORDER — SULFAMETHOXAZOLE-TRIMETHOPRIM 800-160 MG PO TABS: 1.0000 | ORAL_TABLET | Freq: Two times a day (BID) | ORAL | 0 refills | Status: DC

## 2017-03-23 NOTE — Progress Notes (Signed)
Pre visit review using our clinic review tool, if applicable. No additional management support is needed unless otherwise documented below in the visit note. 

## 2017-03-23 NOTE — Assessment & Plan Note (Signed)
Possible exposure to recalled eggs infected with salmonella.  Check stool pathogen panel.  Treat empirically with bactrim DS, discussed cutting coumadin dose to 1/2 tab daily until he finishes abx course. Will send note to coumadin clinic as well.  Update if not improving with treatment.

## 2017-03-23 NOTE — Patient Instructions (Addendum)
Pass by lab to pick up stool test.  Treat possible salmonella with bactrim twice daily for 1 week. While on bactrim, cut down on coumadin dose to 1/2 tablet daily.  Let coumadin clinic know we're starting bactrim.  Let us know if ongoing diarrhea to consider GI referral.  Small sips of fluid throughout the day, bland diet.

## 2017-03-23 NOTE — Progress Notes (Signed)
BP 118/64   Pulse 84   Temp 97.4 F (36.3 C) (Oral)   Wt 237 lb 4 oz (107.6 kg)   BMI 35.04 kg/m    CC: diarrhea Subjective:    Patient ID: [name], male    DOB: 1946/03/26, 71 y.o.   MRN: [id number]  HPI: [name] is a 71 y.o. male presenting on 03/23/2017 for Diarrhea (? salmonella from recalled eggs; chills, sweats, elevated HR (has been to cards); x 1 week; doesn't know what meds he takes-his wife distributes them and he takes them)   Unsure of meds he takes.   Here alone to discuss concerns with diarrhea and possible salmonella poisoning from recently infected [place] in the state. Thinks he was eating recalled infected eggs from [place]. Wife has not been sick.   Watery diarrhea over the past week with fever/chills. + abd pain/cramping improved with defecation. + indigestion and increased gassiness. No blood in stool. No nausea/vomiting.   Saw Dr Rockey Situ last week. PAF - recommended extra amiodarone + dilt PRN afib flare.   Relevant past medical, surgical, family and social history reviewed and updated as indicated. Interim medical history since our last visit reviewed. Allergies and medications reviewed and updated. Outpatient Medications Prior to Visit  Medication Sig Dispense Refill  . amiodarone (PACERONE) 200 MG tablet Take 1 tablet (200 mg total) by mouth daily. 70 tablet 3  . budesonide-formoterol (SYMBICORT) 160-4.5 MCG/ACT inhaler Inhale 1 puff into the lungs 2 (two) times daily.    . Coenzyme Q10 100 MG capsule Take 1 capsule (100 mg total) by mouth daily. 30 capsule 0  . diclofenac sodium (VOLTAREN) 1 % GEL Apply 2 g topically 4 (four) times daily.    Marland Kitchen diltiazem (CARDIZEM) 30 MG tablet TAKE 1 TABLET BY MOUTH EVERY DAY AS DIRECTED AS NEEDED FOR FAST HEART RATE 90 tablet 0  . diphenoxylate-atropine (LOMOTIL) 2.5-0.025 MG tablet TAKE 1 TABLET BY MOUTH TWICE DAILY AS NEEDED FOR DIARRHEA OR LOOSE STOOLS 20 tablet 0  . docusate sodium (COLACE) 100 MG capsule  Take 100 mg by mouth 2 (two) times daily.    . fish oil-omega-3 fatty acids 1000 MG capsule Take 2 g by mouth daily.     . fluticasone (FLONASE) 50 MCG/ACT nasal spray Place 2 sprays into both nostrils daily.    . fluticasone furoate-vilanterol (BREO ELLIPTA) 200-25 MCG/INH AEPB Inhale 1 puff into the lungs daily. 1 each 3  . Fluticasone-Salmeterol (ADVAIR DISKUS) 250-50 MCG/DOSE AEPB Inhale 1 puff into the lungs 2 (two) times daily. 1 each 6  . furosemide (LASIX) 20 MG tablet TAKE 1 TABLET BY MOUTH AS NEEDED FOR FLUID 30 tablet 1  . gabapentin (NEURONTIN) 600 MG tablet TAKE 1 TABLET BY MOUTH THREE TIMES DAILY 90 tablet 3  . glucose blood (ONE TOUCH ULTRA TEST) test strip Check blood sugar once daily and as needed. 250.60 100 each 3  . Glucose Blood (ONETOUCH ULTRA BLUE VI) by In Vitro route. Test blood glucose 1 time per day and as directed.     Marland Kitchen HYDROcodone-acetaminophen (NORCO) 10-325 MG tablet Take 1 tablet by mouth 2 (two) times daily as needed for moderate pain. 62 tablet 0  . JANUVIA 50 MG tablet TAKE 1 TABLET(50 MG) BY MOUTH DAILY 30 tablet 6  . metFORMIN (GLUCOPHAGE) 500 MG tablet TAKE 1 TABLET BY MOUTH EVERY MORNING AND 2 TABLETS EVERY EVENING (Patient taking differently: TAKE 2 TABLET BY MOUTH EVERY MORNING AND 2 TABLETS EVERY EVENING)  90 tablet 6  . metFORMIN (GLUCOPHAGE) 500 MG tablet TAKE 1 TABLET BY MOUTH EVERY MORNING AND 2 TABLETS EVERY EVENING 90 tablet 6  . nitroGLYCERIN (NITROLINGUAL) 0.4 MG/SPRAY spray Place 1 spray under the tongue every 5 (five) minutes as needed. 12 g 3  . omeprazole (PRILOSEC) 40 MG capsule TAKE 1 CAPSULE BY MOUTH EVERY DAY 90 capsule 3  . potassium chloride (K-DUR,KLOR-CON) 10 MEQ tablet TAKE 2 TABLETS BY MOUTH TWICE DAILY 120 tablet 2  . PROAIR HFA 108 (90 Base) MCG/ACT inhaler INHALE 2 PUFFS BY MOUTH EVERY 6 HOURS AS NEEDED FOR WHEEZING OR SHORTNESS OF BREATH 17 g 6  . tiZANidine (ZANAFLEX) 4 MG tablet Take 1 tablet (4 mg total) by mouth 2 (two) times  daily as needed for muscle spasms. 60 tablet 3  . warfarin (COUMADIN) 5 MG tablet TAKE AS DIRECTED BY COUMADIN CLINIC 100 tablet 0   No facility-administered medications prior to visit.      Per HPI unless specifically indicated in ROS section below Review of Systems     Objective:    BP 118/64   Pulse 84   Temp 97.4 F (36.3 C) (Oral)   Wt 237 lb 4 oz (107.6 kg)   BMI 35.04 kg/m   Wt Readings from Last 3 Encounters:  03/23/17 237 lb 4 oz (107.6 kg)  03/20/17 233 lb 12.8 oz (106.1 kg)  01/23/17 230 lb 8 oz (104.6 kg)    Physical Exam  Constitutional: He appears well-developed and well-nourished. No distress.  HENT:  Mouth/Throat: Oropharynx is clear and moist. No oropharyngeal exudate.  dry  Cardiovascular: Normal rate, normal heart sounds and intact distal pulses.  An irregularly irregular rhythm present.  No murmur heard. Pulmonary/Chest: Effort normal and breath sounds normal. No respiratory distress. He has no wheezes. He has no rales.  Abdominal: Soft. Bowel sounds are normal. He exhibits no distension and no mass. There is no tenderness. There is no rebound and no guarding.  Musculoskeletal: He exhibits edema (tr).  Skin: Skin is warm and dry. No rash noted.  Psychiatric: He has a normal mood and affect.  Nursing note and vitals reviewed.  Results for orders placed or performed in visit on 01/21/17  POCT INR  Result Value Ref Range   INR 2.6   POCT INR  Result Value Ref Range   INR 1.6    Lab Results  Component Value Date   CREATININE 1.29 09/12/2016      Assessment & Plan:   Problem List Items Addressed This Visit    Diarrhea - Primary    Possible exposure to recalled eggs infected with salmonella.  Check stool pathogen panel.  Treat empirically with bactrim DS, discussed cutting coumadin dose to 1/2 tab daily until he finishes abx course. Will send note to coumadin clinic as well.  Update if not improving with treatment.       Relevant Orders    Gastrointestinal Pathogen Panel PCR   Encounter for anticoagulation discussion and counseling    Discussed coumadin dosing while on bactrim (rec 2.5mg  while on bactrim).           Follow up plan: Return if symptoms worsen or fail to improve.  Ria Bush, MD

## 2017-03-23 NOTE — Assessment & Plan Note (Signed)
Discussed coumadin dosing while on bactrim (rec 2.5mg  while on bactrim).

## 2017-03-25 ENCOUNTER — Telehealth: Payer: Self-pay | Admitting: Cardiovascular Disease

## 2017-03-25 NOTE — Telephone Encounter (Signed)
Patient says last time he was in the office he had salmonella poisoning from eggs that were recently recalled and didn't know.  Patient was given abx which he did not finish because he is worried about coumadin level.  He states he is scared of these things.    Patient wants to know if he should still continue to take diltiazem and amiodarone  .  He hasnt taken yet today.  He says monitor reads irregular heartbeat   HR last HR was 80's   VS this morning 149/80 HR 52   Please call patient

## 2017-03-25 NOTE — Telephone Encounter (Signed)
S/w pt. He was prescribed bactrim BID x 7 days on 4/13 by PCP for possible salmonella poisoning. Stopped after 2 doses (last dose was 4/17). PCP advised cutting coumadin in half while taking bactrim. Since pt stopped bactrim, he will resume normal coumadin dosage this evening.  I discussed with Doroteo Bradford, RN. Pt does not need sooner coumadin appt since he only decreased it for two days.  Pt inquires of amiodarone and diltiazem doses as this morning BP 149/80, HR 52. Reviewed Dr. Donivan Scull 4/13 OV notes and parameters for when to take amiodarone and diltiazem.  He verbalized understanding, will continue current doses of medications and report irregular rhythm to [place].

## 2017-03-30 NOTE — Addendum Note (Signed)
Addended by: Ellamae Sia on: 03/30/2017 08:41 AM   Modules accepted: Orders

## 2017-03-31 ENCOUNTER — Other Ambulatory Visit: Payer: Self-pay

## 2017-03-31 MED ORDER — HYDROCODONE-ACETAMINOPHEN 10-325 MG PO TABS: 1.0000 | ORAL_TABLET | Freq: Two times a day (BID) | ORAL | 0 refills | Status: DC | PRN

## 2017-03-31 NOTE — Telephone Encounter (Signed)
Printed and in Kim's box 

## 2017-03-31 NOTE — Telephone Encounter (Signed)
[name] left v/m requesting rx hydrocodone apap. Call when ready for pick up. Last printed # 62 on 02/26/17; last acute visit 03/23/17.

## 2017-04-01 NOTE — Telephone Encounter (Signed)
Patient's wife notified and Rx placed up front for pick up. 

## 2017-04-12 ENCOUNTER — Other Ambulatory Visit: Payer: Self-pay | Admitting: Family Medicine

## 2017-04-13 ENCOUNTER — Other Ambulatory Visit: Payer: Self-pay | Admitting: Family Medicine

## 2017-04-13 ENCOUNTER — Other Ambulatory Visit: Payer: Self-pay | Admitting: Cardiovascular Disease

## 2017-04-14 ENCOUNTER — Telehealth: Payer: Self-pay | Admitting: Cardiovascular Disease

## 2017-04-14 NOTE — Telephone Encounter (Signed)
Spoke w/ pt.  He reports elevated SBP - 170/74 this am, HR 45. Pt c/o significant rt neck, arm and wrist pain. He is going to schedule injections to help w/ the pain. He does report that his BP increases when his pain pill wears off and improves when his pain is not present.  He reports that he will come in from working and "just start shaking" so much that he cannot hold a spoon. Sx will abruptly start and then stop.   Pt's HR typically runs 45-55.  He reports that an occasional reading of 65 is high for him.  Pt was advised at last ov on 03/20/17 to:  "When you have fast heart rate >90 bpm Please take amiodarone 2 of the 200 mg pills  Also take diltiazem I pill If it does not go back into normal rhythm after 6 hours, Take another amio 2 pills and one diltiazem When heart rate drops down to 60 bpm, Decrease amiodarone down to 200 mg one a day"  Pt reports that his monitor will read "irregular heat beat" if he only takes 1 amiodarone, so he has been taking 2 tabs BID.  Reiterated above instructions, but he states that his HR is too irregular if he does not take more amiodarone. Pt has not taken any diltiazem. He would like for Dr. Rockey Situ to review his dose and give him updated instructions. Advised him that I will make Dr. Rockey Situ aware of his concerns and call him back w/ his recommendations.

## 2017-04-14 NOTE — Telephone Encounter (Signed)
Pt c/o BP issue: STAT if pt c/o blurred vision, one-sided weakness or slurred speech  1. What are your last 5 BP readings?  This morning 170/74 HR 45 Yesterday it varies, sometimes "it was good, but HR was irregular, and then at night it said it was good. States it stays around 140-150.  2. Are you having any other symptoms (ex. Dizziness, headache, blurred vision, passed out)? Just starts shaking  3. What is your BP issue? elevated  Pt states he is also shaking a lot. Pt is coming in tomorrow for coumadin check.

## 2017-04-15 ENCOUNTER — Ambulatory Visit (INDEPENDENT_AMBULATORY_CARE_PROVIDER_SITE_OTHER): Payer: Medicare Other

## 2017-04-15 VITALS — BP 132/82 | HR 45

## 2017-04-15 DIAGNOSIS — Z5181 Encounter for therapeutic drug level monitoring: Secondary | ICD-10-CM | POA: Diagnosis not present

## 2017-04-15 DIAGNOSIS — I480 Paroxysmal atrial fibrillation: Secondary | ICD-10-CM

## 2017-04-15 DIAGNOSIS — I4891 Unspecified atrial fibrillation: Secondary | ICD-10-CM | POA: Diagnosis not present

## 2017-04-15 LAB — POCT INR: INR: 5.4

## 2017-04-15 NOTE — Telephone Encounter (Signed)
Please see nurse visit w/ me today.

## 2017-04-15 NOTE — Progress Notes (Signed)
1.) Reason for visit: EKG  2.) Name of MD requesting visit: Dr. Rockey Situ  3.) H&P: Pt was advised at last ov 03/20/17:   "When you have fast heart rate >90 bpm Please take amiodarone 2 of the 200 mg pills  Also take diltiazem I pill  If it does not go back into normal rhythm after 6 hours, Take another amio 2 pills and one diltiazem  When heart rate drops down to 60 bpm, Decrease amiodarone down to 200 mg one a day"  4.) ROS related to problem: Pt called the office yesterday c/o elevated BP & irregular HR.  He c/o significant rt neck, arm and wrist pain.  He takes Norco prn, wears a wrist brace and has on 2 Fentanyl patches.  He reports his BP is good when he is pain free, but it escalates when his pain meds wear off.  He reports episodes of sudden onset shaking, like a tremor that will resolve as quickly as it started.  He identifies no triggers for these episodes - they happen at rest or when he is active.  Pt drives a truck for a living, drives cross country at times.  He sleeps in his truck, as he does not want to pay for a motel room.    He brings a BP log that shows BP 144/55, HR 49 on 5/5 while sitting and watching TV for an hour. 9:28 that night after washing his car and grilling hotdogs, BP 160/78, HR 54.   11:24 pm 153/71, HR 47 The next morning @ 6:43 am 144/68, HR 76 - monitor reads "irregular" 6:58 am 138/85, HR 74 8:08 am 124/54, HR 46   He reports that he has increased his dose of amiodarone on his own.  He does not take a regular dose, instead he adjusts the amount of pills based on his HR.  If his HR is <50, he will only take 1 amiodarone.  If his HR starts to get to the 60s, he will take 2 pills, though he does report that he feels better when his HR is in the 70s.  He states that he is afraid to let his HR get too high, as it has gotten >110 and up to 145 before and he wants to avoid that.  EKG today shows HR 45-46.    Pt's wife is out of town today.  Coumadin nurse was  on the phone w/ her to dose his meds.  As pt was leaving, I noticed blood on the side of his face near his ear.  He reports that his wife typically helps him shave, but he had to do it himself and knicked himself in several places while shaving his head and face.  Assisted him w/ getting cleaned up before he left the office.   Pt's INR was 5.4 today.    5.) Assessment and plan per MD:  Advised pt that I will make Dr. Rockey Situ aware of his concerns, but recommended that he come in for eval, as he would like to speak w/ Dr. Rockey Situ personally.  Pt sched for next Friday, 5/18 @ 8:40.  Advised him that I will call him if Dr. Rockey Situ has any recommendations before that time.

## 2017-04-15 NOTE — Telephone Encounter (Signed)
Patient came in today to drop off his BP reading, Patient states he talk to a RN yesterday about his BP and also about the symptoms he is having. Patient is still concern . See bottom message. Reading will be in Nurse box. Patient is here this AM for coumadin. Patient is requesting another call back or to talk to Dr. Rockey Situ. Marland Kitchen His number is [phone]. Thank  you

## 2017-04-16 NOTE — Telephone Encounter (Signed)
I would recommend he start metoprolol 25 mg twice a day on a regular basis This will help with rhythm, palpitations We'll start on low-dose first  Also would take the diltiazem 30 mg pill as needed for breakthrough palpitations Would try to stay on no more than amiodarone 1 pill twice a day

## 2017-04-16 NOTE — Telephone Encounter (Signed)
No answer/Voicemail box is full at this time.

## 2017-04-17 MED ORDER — METOPROLOL TARTRATE 25 MG PO TABS: 25.0000 mg | ORAL_TABLET | Freq: Two times a day (BID) | ORAL | 3 refills | Status: DC

## 2017-04-17 NOTE — Addendum Note (Signed)
Addended by: Dede Query R on: 04/17/2017 09:54 AM   Modules accepted: Orders

## 2017-04-17 NOTE — Telephone Encounter (Signed)
Spoke w/ pt.  Advised him of Dr. Donivan Scull recommendation.  He verbalizes understanding and is agreeable.  Asked him to call back w/ any further questions or concerns.

## 2017-04-20 ENCOUNTER — Telehealth: Payer: Self-pay | Admitting: Cardiovascular Disease

## 2017-04-20 NOTE — Telephone Encounter (Signed)
Spoke w/ pt.  He is "pretty sure" he is taking metoprolol, as his wife takes care of his meds for him.  He is still taking amiodarone BID. Advised him to cut this back to once daily since his HR is so low.  He verbalizes understanding and will continue to monitor until his appt on Friday.

## 2017-04-20 NOTE — Telephone Encounter (Signed)
Pt calling stating he is having issues with his HR Its been going on for a "long time", he knows he is to be here on Friday to see Dr Rockey Situ but is wanting to know what to do until then?   He states its usually around   04/15/17 52 04/16/17 45 04/17/17 42 04/18/17 49 04/19/17 42 04/20/17 38-47   Denies any other symptoms  Would like some advise on this Please call back

## 2017-04-22 ENCOUNTER — Other Ambulatory Visit: Payer: Self-pay

## 2017-04-22 ENCOUNTER — Ambulatory Visit (INDEPENDENT_AMBULATORY_CARE_PROVIDER_SITE_OTHER): Payer: Medicare Other | Admitting: *Deleted

## 2017-04-22 ENCOUNTER — Telehealth: Payer: Self-pay | Admitting: Cardiovascular Disease

## 2017-04-22 DIAGNOSIS — I4891 Unspecified atrial fibrillation: Secondary | ICD-10-CM

## 2017-04-22 DIAGNOSIS — Z5181 Encounter for therapeutic drug level monitoring: Secondary | ICD-10-CM

## 2017-04-22 LAB — POCT INR: INR: 2.9

## 2017-04-22 MED ORDER — HYDROCODONE-ACETAMINOPHEN 10-325 MG PO TABS: 1.0000 | ORAL_TABLET | Freq: Two times a day (BID) | ORAL | 0 refills | Status: DC | PRN

## 2017-04-22 NOTE — Telephone Encounter (Signed)
Left detailed message.  rx placed in blue folder.

## 2017-04-22 NOTE — Telephone Encounter (Signed)
Printed and in CMA box 

## 2017-04-22 NOTE — Telephone Encounter (Signed)
BP LOG Per patient sx when hr is low tired and needs to rest with exertion    04/15/17  1230 pm  137/60  53   630 pm  131/71  50   840 pm  140/60  52  04/16/17    545 am  160/67  48   831 am  152/77  45   1001 am  129/62  50  IRREGULAR   211 pm  127/63  49   525 pm  137/62  48    1020 pm  150/67  47   1100 pm  160/68  62  04/17/17 230 am  136/70  42 IRREGULAR   804 AM   137/61  45 IRREGULAR   1201 PM  120/62  49 IRREGULAR   223 PM  119/50  48    842 PM  143/64  42  04/18/17 710 AM   144/59  38    536 PM  155/65  40 TOOK CAFFEINE PILLS TO STAY AWAKE WHILE         DRIVING AT WORK    093 PM  150/99  37 IRREGULAR     04/19/17 12 AM   129/54  41    735 AM   156/67  37   900 AM   112/48  38    212 PM  141/60  45   938 PM  165/71  41 AFTER DRIVING   2/67/12 458 AM  143/60  40 IRREGULAR   1237 PM  124/56  38   430 PM  165/68  39 GOT HOME   1000 PM  122/57  42  04/21/17 630 AM  130/59  40

## 2017-04-22 NOTE — Telephone Encounter (Signed)
[name](DPR signed) left v/m requesting rx hydrocodone apap. Call when ready for pick up. Last printed # 62 on 03/31/17 and last seen 12/09/16.

## 2017-04-22 NOTE — Telephone Encounter (Signed)
Spoke with patient and reviewed his blood pressure readings. He states that his blood pressure monitor lists the heart rate and if it is irregular or not. He states that his heart rate has been running low and he needs something to be done because he doesn't fell well if it is not in the 60's. He has appointment scheduled to come on Friday to see Dr. Rockey Situ and he states that if we can't get this fixed he will go see another doctor. Instructed him to bring in his blood pressure monitor on Friday and we can compare to our readings to see if there is a difference. Let him know that I would send these readings to Dr. Rockey Situ for his review and we will see him on Friday. He was appreciative for the call and had no further questions at this time.
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
## 2017-04-24 ENCOUNTER — Encounter: Payer: Self-pay | Admitting: Cardiovascular Disease

## 2017-04-24 ENCOUNTER — Ambulatory Visit (INDEPENDENT_AMBULATORY_CARE_PROVIDER_SITE_OTHER): Payer: Medicare Other | Admitting: Cardiovascular Disease

## 2017-04-24 ENCOUNTER — Other Ambulatory Visit: Payer: Self-pay | Admitting: Cardiovascular Disease

## 2017-04-24 VITALS — BP 118/64 | HR 38 | Ht 69.0 in | Wt 234.5 lb

## 2017-04-24 DIAGNOSIS — I714 Abdominal aortic aneurysm, without rupture, unspecified: Secondary | ICD-10-CM

## 2017-04-24 DIAGNOSIS — E1140 Type 2 diabetes mellitus with diabetic neuropathy, unspecified: Secondary | ICD-10-CM | POA: Diagnosis not present

## 2017-04-24 DIAGNOSIS — I480 Paroxysmal atrial fibrillation: Secondary | ICD-10-CM

## 2017-04-24 DIAGNOSIS — Z720 Tobacco use: Secondary | ICD-10-CM

## 2017-04-24 DIAGNOSIS — E782 Mixed hyperlipidemia: Secondary | ICD-10-CM

## 2017-04-24 DIAGNOSIS — R0602 Shortness of breath: Secondary | ICD-10-CM

## 2017-04-24 DIAGNOSIS — E1165 Type 2 diabetes mellitus with hyperglycemia: Secondary | ICD-10-CM | POA: Diagnosis not present

## 2017-04-24 DIAGNOSIS — J449 Chronic obstructive pulmonary disease, unspecified: Secondary | ICD-10-CM

## 2017-04-24 DIAGNOSIS — Z7189 Other specified counseling: Secondary | ICD-10-CM

## 2017-04-24 DIAGNOSIS — R001 Bradycardia, unspecified: Secondary | ICD-10-CM | POA: Diagnosis not present

## 2017-04-24 DIAGNOSIS — IMO0002 Reserved for concepts with insufficient information to code with codable children: Secondary | ICD-10-CM

## 2017-04-24 MED ORDER — AMLODIPINE BESYLATE 5 MG PO TABS: 5.0000 mg | ORAL_TABLET | Freq: Every day | ORAL | 3 refills | Status: DC | PRN

## 2017-04-24 NOTE — Patient Instructions (Addendum)
Medication Instructions:    Please do not take metoprolol for pulse/heart rate less than 60  Ok to take metoprolol 1/2 pill pill twice a day if pulse is 55 to 60   Please take one amiodarone per day Extra amiodarone for atrial fibrillation (2 a day or more if needed)  Magnesium for muscle cramps  Take norvasc/amlodipine for high blood pressure as needed  Labwork:  No new labs needed  Testing/Procedures:  No further testing at this time   I recommend watching educational videos on topics of interest to you at:       www.goemmi.com  Enter code: [email or web address]    Follow-Up: It was a pleasure seeing you in the office today. Please call us if you have new issues that need to be addressed before your next appt.  [phone]  Your physician wants you to follow-up in: 6 months.  You will receive a reminder letter in the mail two months in advance. If you don't receive a letter, please call our office to schedule the follow-up appointment.  If you need a refill on your cardiac medications before your next appointment, please call your pharmacy.

## 2017-04-30 DIAGNOSIS — K4020 Bilateral inguinal hernia, without obstruction or gangrene, not specified as recurrent: Secondary | ICD-10-CM | POA: Diagnosis not present

## 2017-04-30 DIAGNOSIS — I716 Thoracoabdominal aortic aneurysm, without rupture: Secondary | ICD-10-CM | POA: Diagnosis not present

## 2017-04-30 DIAGNOSIS — I714 Abdominal aortic aneurysm, without rupture: Secondary | ICD-10-CM | POA: Diagnosis not present

## 2017-04-30 DIAGNOSIS — Z9889 Other specified postprocedural states: Secondary | ICD-10-CM | POA: Diagnosis not present

## 2017-04-30 DIAGNOSIS — Z87891 Personal history of nicotine dependence: Secondary | ICD-10-CM | POA: Diagnosis not present

## 2017-04-30 DIAGNOSIS — Z79899 Other long term (current) drug therapy: Secondary | ICD-10-CM | POA: Diagnosis not present

## 2017-04-30 DIAGNOSIS — Z09 Encounter for follow-up examination after completed treatment for conditions other than malignant neoplasm: Secondary | ICD-10-CM | POA: Diagnosis not present

## 2017-04-30 DIAGNOSIS — I723 Aneurysm of iliac artery: Secondary | ICD-10-CM | POA: Diagnosis not present

## 2017-04-30 DIAGNOSIS — E119 Type 2 diabetes mellitus without complications: Secondary | ICD-10-CM | POA: Diagnosis not present

## 2017-04-30 DIAGNOSIS — Z95828 Presence of other vascular implants and grafts: Secondary | ICD-10-CM | POA: Diagnosis not present

## 2017-05-01 DIAGNOSIS — M25842 Other specified joint disorders, left hand: Secondary | ICD-10-CM | POA: Diagnosis not present

## 2017-05-01 DIAGNOSIS — R2232 Localized swelling, mass and lump, left upper limb: Secondary | ICD-10-CM | POA: Diagnosis not present

## 2017-05-01 DIAGNOSIS — IMO0002 Reserved for concepts with insufficient information to code with codable children: Secondary | ICD-10-CM | POA: Insufficient documentation

## 2017-05-01 DIAGNOSIS — R229 Localized swelling, mass and lump, unspecified: Secondary | ICD-10-CM

## 2017-05-05 ENCOUNTER — Other Ambulatory Visit: Payer: Self-pay | Admitting: Orthopedic Surgery

## 2017-05-07 ENCOUNTER — Other Ambulatory Visit: Payer: Self-pay | Admitting: Family Medicine

## 2017-05-07 DIAGNOSIS — Z006 Encounter for examination for normal comparison and control in clinical research program: Secondary | ICD-10-CM | POA: Diagnosis not present

## 2017-05-07 DIAGNOSIS — I716 Thoracoabdominal aortic aneurysm, without rupture: Secondary | ICD-10-CM | POA: Diagnosis not present

## 2017-05-08 ENCOUNTER — Other Ambulatory Visit: Payer: Self-pay | Admitting: Cardiovascular Disease

## 2017-05-08 NOTE — Telephone Encounter (Signed)
plz phone in lomotil

## 2017-05-10 ENCOUNTER — Other Ambulatory Visit: Payer: Self-pay | Admitting: Cardiovascular Disease

## 2017-05-11 NOTE — Telephone Encounter (Signed)
Please review for refill, Thanks !  

## 2017-05-11 NOTE — Telephone Encounter (Signed)
Rx called in to requested pharmacy 

## 2017-05-18 DIAGNOSIS — M25511 Pain in right shoulder: Secondary | ICD-10-CM | POA: Diagnosis not present

## 2017-05-18 DIAGNOSIS — M7711 Lateral epicondylitis, right elbow: Secondary | ICD-10-CM | POA: Diagnosis not present

## 2017-05-23 DIAGNOSIS — R531 Weakness: Secondary | ICD-10-CM | POA: Diagnosis not present

## 2017-05-23 DIAGNOSIS — R6889 Other general symptoms and signs: Secondary | ICD-10-CM | POA: Diagnosis not present

## 2017-05-25 NOTE — Progress Notes (Signed)
Chart reviewed by Dr Kalman Shan, due to patients multiple comorbidities, he will be better served being done at main OR. Elmo Putt at Dr Levell July office aware.

## 2017-05-26 ENCOUNTER — Telehealth: Payer: Self-pay | Admitting: Cardiovascular Disease

## 2017-05-26 NOTE — Telephone Encounter (Signed)
Received cardiac clearance request for pt to proceed w/ excision of mass on left hand on 05/29/17 w/ IV regional FAB w/ Dr. Daryll Brod.  Please route clearance and instructions on holding pt's coumadin to [facility] @ [phone].

## 2017-05-27 ENCOUNTER — Other Ambulatory Visit: Payer: Self-pay

## 2017-05-27 ENCOUNTER — Ambulatory Visit (INDEPENDENT_AMBULATORY_CARE_PROVIDER_SITE_OTHER): Payer: Medicare Other | Admitting: *Deleted

## 2017-05-27 ENCOUNTER — Telehealth: Payer: Self-pay | Admitting: Cardiovascular Disease

## 2017-05-27 DIAGNOSIS — I4891 Unspecified atrial fibrillation: Secondary | ICD-10-CM

## 2017-05-27 DIAGNOSIS — Z5181 Encounter for therapeutic drug level monitoring: Secondary | ICD-10-CM

## 2017-05-27 LAB — POCT INR: INR: 3.7

## 2017-05-27 MED ORDER — HYDROCODONE-ACETAMINOPHEN 10-325 MG PO TABS: 1.0000 | ORAL_TABLET | Freq: Two times a day (BID) | ORAL | 0 refills | Status: DC | PRN

## 2017-05-27 NOTE — Telephone Encounter (Signed)
FYI  Pt called office asking about clearance  Stated to him we have faxed it to the office to call them  He was upset and stated if he was not able to get the cyst removed on Friday he would take a razor blade and cut it off himself And that he will not return back to them or to our office to see Dr Rockey Situ Stated to him we received the clear ence yesterday and we sent our response today he was very upset and said he doesn't give a care and not care and he would cut it off himself

## 2017-05-27 NOTE — Telephone Encounter (Signed)
Spoke with [name] at [facility] she stated the procedure would be done at the [facility] not in the [place]. However, she does not have a new date at this time since the pt needs to hold Coumadin 5 days.  She will call back to give [place] the date & stated it would probably be the first week in July, advised to call back in plenty of time to ensure the pt has enough time for holding Coumadin. Also, she is aware that the pt's INR is 3.7 today.  She will call the pt regarding the procedure being rescheduled.

## 2017-05-27 NOTE — Telephone Encounter (Signed)
Would hold warfarin 5 days prior to surgery, restart after procedure Acceptable risk for surgery

## 2017-05-27 NOTE — Telephone Encounter (Signed)
Called Mr [name] informed RX available for pick-up @ his convince @ front office

## 2017-05-27 NOTE — Telephone Encounter (Signed)
Called Dr. Lucie Leather office regarding the pt having a procedure & had to leave a msg stating to call back to the [facility] office to inform us of the surgical date so we can ensure the pt's Anticoagulant is held as approved by Dr. Rockey Situ.

## 2017-05-27 NOTE — Telephone Encounter (Signed)
Routed to fax # provided. 

## 2017-05-27 NOTE — Telephone Encounter (Signed)
Printed and in CMA box 

## 2017-05-27 NOTE — Telephone Encounter (Signed)
Attempted to contact pt.  Left him a message that we sent Dr. Donivan Scull recommendation to his surgeon, but it is ultimately up to the performing MD if they feel comfortable doing surgery while he is on coumadin.  Advised him that we have faxed clearance to Dr. Levell July office and for him to call back if he would like to discuss.

## 2017-05-27 NOTE — Telephone Encounter (Signed)
[name] (DPR signed) left v/m requesting rx hydrocodone apap. Call when ready for pick up.rx last printed # 58 on 04/22/17. Last seen 12/09/16.

## 2017-05-28 NOTE — Telephone Encounter (Signed)
Hand center called back - they are ok with holding Coumadin for 3 days prior to procedure. Pt takes warfarin for afib with CHADS2 score of 3 (DM, CHF, HTN). Ok to hold warfarin for 3 days prior to procedure and hand center is aware of recommendation.

## 2017-05-29 ENCOUNTER — Encounter (HOSPITAL_BASED_OUTPATIENT_CLINIC_OR_DEPARTMENT_OTHER): Admission: RE | Payer: Self-pay | Source: Ambulatory Visit

## 2017-05-29 ENCOUNTER — Ambulatory Visit (HOSPITAL_BASED_OUTPATIENT_CLINIC_OR_DEPARTMENT_OTHER): Admission: RE | Admit: 2017-05-29 | Payer: Medicare Other | Source: Ambulatory Visit | Admitting: Orthopedic Surgery

## 2017-05-29 DIAGNOSIS — R2232 Localized swelling, mass and lump, left upper limb: Secondary | ICD-10-CM | POA: Diagnosis not present

## 2017-05-29 DIAGNOSIS — L720 Epidermal cyst: Secondary | ICD-10-CM | POA: Diagnosis not present

## 2017-05-29 SURGERY — EXCISION MASS
Anesthesia: Regional | Laterality: Left

## 2017-06-04 ENCOUNTER — Other Ambulatory Visit: Payer: Self-pay | Admitting: Family Medicine

## 2017-06-04 NOTE — Telephone Encounter (Signed)
Last OV 03/2017-acute. Last Rx 05/08/2017

## 2017-06-08 ENCOUNTER — Other Ambulatory Visit: Payer: Self-pay | Admitting: Family Medicine

## 2017-06-15 ENCOUNTER — Other Ambulatory Visit: Payer: Self-pay | Admitting: Cardiovascular Disease

## 2017-06-17 ENCOUNTER — Ambulatory Visit (INDEPENDENT_AMBULATORY_CARE_PROVIDER_SITE_OTHER): Payer: Medicare Other | Admitting: *Deleted

## 2017-06-17 DIAGNOSIS — Z5181 Encounter for therapeutic drug level monitoring: Secondary | ICD-10-CM | POA: Diagnosis not present

## 2017-06-17 DIAGNOSIS — I4891 Unspecified atrial fibrillation: Secondary | ICD-10-CM

## 2017-06-17 LAB — POCT INR: INR: 2.5

## 2017-06-28 ENCOUNTER — Other Ambulatory Visit: Payer: Self-pay | Admitting: Family Medicine

## 2017-06-28 NOTE — Telephone Encounter (Signed)
plz phone in. 

## 2017-06-29 ENCOUNTER — Other Ambulatory Visit: Payer: Self-pay

## 2017-06-29 DIAGNOSIS — G8929 Other chronic pain: Secondary | ICD-10-CM

## 2017-06-29 NOTE — Telephone Encounter (Signed)
Last filled 05-31-17 #62. Last OV Acute 03-23-17 No Future OV Last UDS 12-10-16

## 2017-06-29 NOTE — Telephone Encounter (Signed)
Left refill on voice mail at pharmacy  

## 2017-06-30 MED ORDER — HYDROCODONE-ACETAMINOPHEN 10-325 MG PO TABS: 1.0000 | ORAL_TABLET | Freq: Two times a day (BID) | ORAL | 0 refills | Status: DC | PRN

## 2017-06-30 NOTE — Telephone Encounter (Signed)
Printed and in CMA box 

## 2017-06-30 NOTE — Telephone Encounter (Signed)
Patient's wife (on [place]) notified by telephone that script is up front for pickup. Lynelle Doctor that patient needs to pick the script up this time per Dr. Danise Mina.

## 2017-07-06 ENCOUNTER — Encounter: Payer: Self-pay | Admitting: Family Medicine

## 2017-07-06 DIAGNOSIS — Z79891 Long term (current) use of opiate analgesic: Secondary | ICD-10-CM | POA: Diagnosis not present

## 2017-07-15 ENCOUNTER — Ambulatory Visit (INDEPENDENT_AMBULATORY_CARE_PROVIDER_SITE_OTHER): Payer: Medicare Other

## 2017-07-15 DIAGNOSIS — Z5181 Encounter for therapeutic drug level monitoring: Secondary | ICD-10-CM

## 2017-07-15 DIAGNOSIS — I4891 Unspecified atrial fibrillation: Secondary | ICD-10-CM | POA: Diagnosis not present

## 2017-07-15 LAB — POCT INR: INR: 2.3

## 2017-07-24 DIAGNOSIS — I5032 Chronic diastolic (congestive) heart failure: Secondary | ICD-10-CM | POA: Insufficient documentation

## 2017-07-24 DIAGNOSIS — I716 Thoracoabdominal aortic aneurysm, without rupture, unspecified: Secondary | ICD-10-CM | POA: Insufficient documentation

## 2017-07-24 DIAGNOSIS — I509 Heart failure, unspecified: Secondary | ICD-10-CM | POA: Insufficient documentation

## 2017-07-27 ENCOUNTER — Other Ambulatory Visit: Payer: Self-pay

## 2017-07-27 MED ORDER — HYDROCODONE-ACETAMINOPHEN 10-325 MG PO TABS: 1.0000 | ORAL_TABLET | Freq: Two times a day (BID) | ORAL | 0 refills | Status: DC | PRN

## 2017-07-27 NOTE — Telephone Encounter (Signed)
Printed and in CMA box 

## 2017-07-27 NOTE — Telephone Encounter (Signed)
Pt left v/m requesting rx hydrocodone apap. Call when ready for pick up. rx last printed # 43 on 06/30/17. Last seen 12/27/16.

## 2017-07-28 NOTE — Telephone Encounter (Signed)
Spoke to pt and informed him Rx is available for pickup from the front desk 

## 2017-08-01 ENCOUNTER — Other Ambulatory Visit: Payer: Self-pay | Admitting: Family Medicine

## 2017-08-03 ENCOUNTER — Telehealth: Payer: Self-pay | Admitting: Family Medicine

## 2017-08-03 ENCOUNTER — Ambulatory Visit: Payer: Medicare Other | Admitting: Family Medicine

## 2017-08-03 NOTE — Telephone Encounter (Signed)
Ok to cancel. Thanks  

## 2017-08-03 NOTE — Telephone Encounter (Signed)
Patient's wife called to cancel patient's appointment for today at 8:15 .Patient had to go out of town.  Patient's wife will call back to reschedule when he's back in town.  Can patient's appointment be cancelled?

## 2017-08-05 ENCOUNTER — Encounter: Payer: Self-pay | Admitting: Family Medicine

## 2017-08-05 ENCOUNTER — Ambulatory Visit (INDEPENDENT_AMBULATORY_CARE_PROVIDER_SITE_OTHER): Payer: Medicare Other | Admitting: Family Medicine

## 2017-08-05 DIAGNOSIS — I714 Abdominal aortic aneurysm, without rupture, unspecified: Secondary | ICD-10-CM

## 2017-08-05 DIAGNOSIS — L0201 Cutaneous abscess of face: Secondary | ICD-10-CM

## 2017-08-05 DIAGNOSIS — IMO0002 Reserved for concepts with insufficient information to code with codable children: Secondary | ICD-10-CM

## 2017-08-05 DIAGNOSIS — I480 Paroxysmal atrial fibrillation: Secondary | ICD-10-CM | POA: Diagnosis not present

## 2017-08-05 DIAGNOSIS — E1165 Type 2 diabetes mellitus with hyperglycemia: Secondary | ICD-10-CM | POA: Diagnosis not present

## 2017-08-05 DIAGNOSIS — E1140 Type 2 diabetes mellitus with diabetic neuropathy, unspecified: Secondary | ICD-10-CM

## 2017-08-05 DIAGNOSIS — G8929 Other chronic pain: Secondary | ICD-10-CM

## 2017-08-05 DIAGNOSIS — J449 Chronic obstructive pulmonary disease, unspecified: Secondary | ICD-10-CM

## 2017-08-05 HISTORY — DX: Cutaneous abscess of face: L02.01

## 2017-08-05 MED ORDER — FUROSEMIDE 20 MG PO TABS: 20.0000 mg | ORAL_TABLET | Freq: Every day | ORAL | 3 refills | Status: DC | PRN

## 2017-08-05 MED ORDER — CEPHALEXIN 500 MG PO CAPS: 500.0000 mg | ORAL_CAPSULE | Freq: Three times a day (TID) | ORAL | 0 refills | Status: DC

## 2017-08-05 NOTE — Assessment & Plan Note (Signed)
Small abscess without significant erythema. Some pus expressed from wound. Will cover with keflex abx. Advised return if not improved with treatment.
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
## 2017-08-05 NOTE — Assessment & Plan Note (Signed)
Due for DM f/u and labs. Will need to return for this.

## 2017-08-05 NOTE — Patient Instructions (Addendum)
Treat left cheek with antibiotic sent to pharmacy - take 5 day course. Return if not fully healed for a recheck.  You should have refills of zanaflex.  Lasix refilled.

## 2017-08-06 DIAGNOSIS — E119 Type 2 diabetes mellitus without complications: Secondary | ICD-10-CM | POA: Diagnosis not present

## 2017-08-06 DIAGNOSIS — Z6831 Body mass index (BMI) 31.0-31.9, adult: Secondary | ICD-10-CM | POA: Diagnosis not present

## 2017-08-06 DIAGNOSIS — Z87891 Personal history of nicotine dependence: Secondary | ICD-10-CM | POA: Diagnosis not present

## 2017-08-06 DIAGNOSIS — Z09 Encounter for follow-up examination after completed treatment for conditions other than malignant neoplasm: Secondary | ICD-10-CM | POA: Diagnosis not present

## 2017-08-06 DIAGNOSIS — Z006 Encounter for examination for normal comparison and control in clinical research program: Secondary | ICD-10-CM | POA: Diagnosis not present

## 2017-08-06 DIAGNOSIS — I716 Thoracoabdominal aortic aneurysm, without rupture: Secondary | ICD-10-CM | POA: Diagnosis not present

## 2017-08-06 DIAGNOSIS — Z95828 Presence of other vascular implants and grafts: Secondary | ICD-10-CM | POA: Diagnosis not present

## 2017-08-06 NOTE — Assessment & Plan Note (Signed)
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 He has been on current regimen long term and has tolerated well, however I advised I could not write this letter as the meds he's on do have potential to affect driving ability ,even though they do not currently. 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  Last UDS appropriate.

## 2017-08-06 NOTE — Assessment & Plan Note (Signed)
On coumadin followed by cardiology clinic.

## 2017-08-06 NOTE — Assessment & Plan Note (Signed)
Not regular with controller medication.

## 2017-08-11 ENCOUNTER — Ambulatory Visit (INDEPENDENT_AMBULATORY_CARE_PROVIDER_SITE_OTHER): Payer: Medicare Other | Admitting: Family Medicine

## 2017-08-11 ENCOUNTER — Encounter: Payer: Self-pay | Admitting: Family Medicine

## 2017-08-11 ENCOUNTER — Telehealth: Payer: Self-pay | Admitting: Family Medicine

## 2017-08-11 ENCOUNTER — Ambulatory Visit (INDEPENDENT_AMBULATORY_CARE_PROVIDER_SITE_OTHER)
Admission: RE | Admit: 2017-08-11 | Discharge: 2017-08-11 | Disposition: A | Discharge status: Home / Self Care | Payer: Medicare Other | Source: Ambulatory Visit | Attending: Family Medicine | Admitting: Family Medicine

## 2017-08-11 VITALS — BP 120/70 | HR 58 | Temp 98.4°F | Wt 221.5 lb

## 2017-08-11 DIAGNOSIS — G8929 Other chronic pain: Secondary | ICD-10-CM

## 2017-08-11 DIAGNOSIS — L0201 Cutaneous abscess of face: Secondary | ICD-10-CM | POA: Diagnosis not present

## 2017-08-11 DIAGNOSIS — S3992XA Unspecified injury of lower back, initial encounter: Secondary | ICD-10-CM | POA: Diagnosis not present

## 2017-08-11 DIAGNOSIS — M545 Low back pain, unspecified: Secondary | ICD-10-CM

## 2017-08-11 DIAGNOSIS — L089 Local infection of the skin and subcutaneous tissue, unspecified: Secondary | ICD-10-CM | POA: Diagnosis not present

## 2017-08-11 DIAGNOSIS — Z1881 Retained glass fragments: Secondary | ICD-10-CM

## 2017-08-11 DIAGNOSIS — S0085XD Superficial foreign body of other part of head, subsequent encounter: Secondary | ICD-10-CM | POA: Diagnosis not present

## 2017-08-11 HISTORY — DX: Retained glass fragments: Z18.81

## 2017-08-11 MED ORDER — DICLOFENAC SODIUM 1 % TD GEL: 2.0000 g | Freq: Three times a day (TID) | TRANSDERMAL | 1 refills | Status: DC
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
## 2017-08-11 NOTE — Assessment & Plan Note (Signed)
Discussed anticipated Q25mo office visit for controlled substances - pt states he has no problem coming in Q3 mo for this.

## 2017-08-11 NOTE — Patient Instructions (Addendum)
xrays of lower back Try voltaren gel to lower back - sent to pharmacy. Continue lumbar brace.  Finish antibiotic course.

## 2017-08-11 NOTE — Assessment & Plan Note (Signed)
All shards able to be removed. See below.
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
## 2017-08-11 NOTE — Assessment & Plan Note (Signed)
Glass shards removed from wound, after care instructions reviewed, rec finish keflex abx.

## 2017-08-11 NOTE — Telephone Encounter (Signed)
Pt has appt with Dr Darnell Level 08/11/17 at 11:15.

## 2017-08-12 ENCOUNTER — Other Ambulatory Visit: Payer: Self-pay | Admitting: Family Medicine

## 2017-08-13 NOTE — Telephone Encounter (Signed)
Spoke to pharmacy who did not receive 07/2017 Rx. Called in to requested pharmacy

## 2017-08-15 ENCOUNTER — Other Ambulatory Visit: Payer: Self-pay | Admitting: Cardiovascular Disease

## 2017-08-17 ENCOUNTER — Ambulatory Visit (INDEPENDENT_AMBULATORY_CARE_PROVIDER_SITE_OTHER): Payer: Medicare Other

## 2017-08-17 ENCOUNTER — Encounter: Payer: Self-pay | Admitting: Family Medicine

## 2017-08-17 ENCOUNTER — Ambulatory Visit (INDEPENDENT_AMBULATORY_CARE_PROVIDER_SITE_OTHER): Payer: Medicare Other | Admitting: Family Medicine

## 2017-08-17 DIAGNOSIS — I4891 Unspecified atrial fibrillation: Secondary | ICD-10-CM | POA: Diagnosis not present

## 2017-08-17 DIAGNOSIS — M545 Low back pain, unspecified: Secondary | ICD-10-CM

## 2017-08-17 DIAGNOSIS — Z5181 Encounter for therapeutic drug level monitoring: Secondary | ICD-10-CM | POA: Diagnosis not present

## 2017-08-17 LAB — POCT INR: INR: 3.7

## 2017-08-17 NOTE — Telephone Encounter (Signed)
Refill Request.  

## 2017-08-17 NOTE — Assessment & Plan Note (Signed)
>  15 minutes spent in face to face time with patient, >50% spent in counselling or coordination of care Unfortunately explained to pt that he is under a pain contract with his PCP and I cannot manage this for him.  I did talk to schedulers up front to get him an appointment with PCP tomorrow. Advised to continue zanaflex and voltaren gel. The patient indicates understanding of these issues and agrees with the plan.
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
## 2017-08-17 NOTE — Patient Instructions (Signed)
Good to see you. Please make an appointment to see Dr. Darnell Level tomorrow.
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
## 2017-08-18 ENCOUNTER — Encounter: Payer: Self-pay | Admitting: Family Medicine

## 2017-08-18 ENCOUNTER — Ambulatory Visit (INDEPENDENT_AMBULATORY_CARE_PROVIDER_SITE_OTHER): Payer: Medicare Other | Admitting: Family Medicine

## 2017-08-18 VITALS — BP 126/60 | HR 48 | Temp 97.8°F | Wt 166.0 lb

## 2017-08-18 DIAGNOSIS — G894 Chronic pain syndrome: Secondary | ICD-10-CM

## 2017-08-18 DIAGNOSIS — I714 Abdominal aortic aneurysm, without rupture, unspecified: Secondary | ICD-10-CM

## 2017-08-18 DIAGNOSIS — G8929 Other chronic pain: Secondary | ICD-10-CM

## 2017-08-18 DIAGNOSIS — M545 Low back pain, unspecified: Secondary | ICD-10-CM

## 2017-08-18 DIAGNOSIS — Z1881 Retained glass fragments: Secondary | ICD-10-CM

## 2017-08-18 MED ORDER — GABAPENTIN 600 MG PO TABS: 600.0000 mg | ORAL_TABLET | Freq: Four times a day (QID) | ORAL | 0 refills | Status: DC

## 2017-08-18 MED ORDER — HYDROCODONE-ACETAMINOPHEN 10-325 MG PO TABS: 1.0000 | ORAL_TABLET | Freq: Two times a day (BID) | ORAL | 0 refills | Status: DC | PRN

## 2017-08-18 NOTE — Patient Instructions (Addendum)
Increased dose of hydrocodone for the next 4 weeks (three times daily) as well as gabapentin.  We will refer you to [facility].  Good to see you today, call us with questions.
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
## 2017-08-19 ENCOUNTER — Encounter: Payer: Self-pay | Admitting: Family Medicine
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
## 2017-08-19 NOTE — Assessment & Plan Note (Signed)
S/p fenestrated endovascular AAA repair 09/2016 

## 2017-08-19 NOTE — Assessment & Plan Note (Signed)
L cheek healing remarkably well.
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
## 2017-08-20 ENCOUNTER — Telehealth: Payer: Self-pay

## 2017-08-20 MED ORDER — HYDROCODONE-ACETAMINOPHEN 10-325 MG PO TABS: 1.0000 | ORAL_TABLET | Freq: Three times a day (TID) | ORAL | 0 refills | Status: DC | PRN

## 2017-08-20 NOTE — Telephone Encounter (Signed)
[name] left v/m that the pharmacy is waiting on rx for increased instructions for hydrocodone. The med list has hydrocodone apap 10 - 325 mg # 90 but did not increase instructions to tid.Please advise. [name] request cb.

## 2017-08-20 NOTE — Telephone Encounter (Signed)
Yes I'm sorry - I have printed out new Rx with corrected sig.  plz have him or wife pick up and drop off old script.
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
## 2017-08-20 NOTE — Telephone Encounter (Signed)
Placed new rx at front office. Spoke with [place], per dpr, and notified her rx is ready to pick up and to please return the old rx. Says they will bring in tomorrow.

## 2017-08-21 NOTE — Telephone Encounter (Signed)
Pt returned old rx. Placed in shredded.

## 2017-08-24 ENCOUNTER — Other Ambulatory Visit: Payer: Self-pay | Admitting: Cardiovascular Disease

## 2017-08-24 ENCOUNTER — Encounter: Payer: Self-pay | Admitting: Family Medicine

## 2017-08-24 ENCOUNTER — Ambulatory Visit (INDEPENDENT_AMBULATORY_CARE_PROVIDER_SITE_OTHER)
Admission: RE | Admit: 2017-08-24 | Discharge: 2017-08-24 | Disposition: A | Discharge status: Home / Self Care | Payer: Medicare Other | Source: Ambulatory Visit | Attending: Family Medicine | Admitting: Family Medicine

## 2017-08-24 ENCOUNTER — Ambulatory Visit (INDEPENDENT_AMBULATORY_CARE_PROVIDER_SITE_OTHER): Payer: Medicare Other | Admitting: Family Medicine

## 2017-08-24 VITALS — BP 124/60 | HR 56 | Temp 97.9°F | Wt 218.0 lb

## 2017-08-24 DIAGNOSIS — Z1881 Retained glass fragments: Secondary | ICD-10-CM

## 2017-08-24 DIAGNOSIS — S0993XA Unspecified injury of face, initial encounter: Secondary | ICD-10-CM | POA: Diagnosis not present

## 2017-08-24 MED ORDER — CEPHALEXIN 500 MG PO CAPS: 500.0000 mg | ORAL_CAPSULE | Freq: Three times a day (TID) | ORAL | 0 refills | Status: DC
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
## 2017-08-24 NOTE — Patient Instructions (Signed)
Fill antibiotic if would continues to drain pus. If this happens, let us know for CT of cheek. We may refer you to skin surgeon if concern for retained small shard.

## 2017-08-24 NOTE — Assessment & Plan Note (Addendum)
Concern for recurrent embedded shard of glass.  Check xray today - clear on my read.  I don't feel definitive piece of glass present. I explained options - CT of cheek, plastic surgery referral. Discussed I don't feel confident re-exploration in our office would be successful.  Will print Rx for keflex in case pus recurs and if that happens to let me know for CT and further evaluation. Pt satisfied with plan.

## 2017-08-29 ENCOUNTER — Other Ambulatory Visit: Payer: Self-pay | Admitting: Cardiovascular Disease

## 2017-08-31 ENCOUNTER — Other Ambulatory Visit: Payer: Self-pay | Admitting: Family Medicine

## 2017-08-31 ENCOUNTER — Ambulatory Visit: Payer: Medicare Other | Admitting: Nurse Practitioner

## 2017-08-31 NOTE — Telephone Encounter (Signed)
Last filled:  06/04/17, #180 Last OV (pain):  08/18/17 Next OV:  none

## 2017-09-03 ENCOUNTER — Other Ambulatory Visit: Payer: Self-pay | Admitting: Cardiovascular Disease

## 2017-09-07 ENCOUNTER — Ambulatory Visit: Payer: Medicare Other | Attending: Nurse Practitioner | Admitting: Nurse Practitioner

## 2017-09-07 ENCOUNTER — Ambulatory Visit (INDEPENDENT_AMBULATORY_CARE_PROVIDER_SITE_OTHER): Payer: Medicare Other

## 2017-09-07 ENCOUNTER — Encounter: Payer: Self-pay | Admitting: Nurse Practitioner

## 2017-09-07 VITALS — BP 115/65 | HR 51 | Temp 98.0°F | Resp 16 | Ht 69.0 in | Wt 215.0 lb

## 2017-09-07 DIAGNOSIS — G894 Chronic pain syndrome: Secondary | ICD-10-CM | POA: Insufficient documentation

## 2017-09-07 DIAGNOSIS — I4891 Unspecified atrial fibrillation: Secondary | ICD-10-CM | POA: Diagnosis not present

## 2017-09-07 DIAGNOSIS — Z79899 Other long term (current) drug therapy: Secondary | ICD-10-CM

## 2017-09-07 DIAGNOSIS — M79601 Pain in right arm: Secondary | ICD-10-CM | POA: Diagnosis not present

## 2017-09-07 DIAGNOSIS — G8929 Other chronic pain: Secondary | ICD-10-CM

## 2017-09-07 DIAGNOSIS — F1120 Opioid dependence, uncomplicated: Secondary | ICD-10-CM | POA: Insufficient documentation

## 2017-09-07 DIAGNOSIS — M25511 Pain in right shoulder: Secondary | ICD-10-CM | POA: Insufficient documentation

## 2017-09-07 DIAGNOSIS — M899 Disorder of bone, unspecified: Secondary | ICD-10-CM | POA: Diagnosis not present

## 2017-09-07 DIAGNOSIS — I480 Paroxysmal atrial fibrillation: Secondary | ICD-10-CM

## 2017-09-07 DIAGNOSIS — M545 Low back pain: Secondary | ICD-10-CM | POA: Diagnosis not present

## 2017-09-07 DIAGNOSIS — Z789 Other specified health status: Secondary | ICD-10-CM

## 2017-09-07 DIAGNOSIS — Z5181 Encounter for therapeutic drug level monitoring: Secondary | ICD-10-CM

## 2017-09-07 DIAGNOSIS — Z79891 Long term (current) use of opiate analgesic: Secondary | ICD-10-CM | POA: Diagnosis not present

## 2017-09-07 DIAGNOSIS — M542 Cervicalgia: Secondary | ICD-10-CM | POA: Diagnosis not present

## 2017-09-07 DIAGNOSIS — N529 Male erectile dysfunction, unspecified: Secondary | ICD-10-CM | POA: Diagnosis not present

## 2017-09-07 DIAGNOSIS — J449 Chronic obstructive pulmonary disease, unspecified: Secondary | ICD-10-CM | POA: Insufficient documentation

## 2017-09-07 DIAGNOSIS — G4733 Obstructive sleep apnea (adult) (pediatric): Secondary | ICD-10-CM | POA: Diagnosis not present

## 2017-09-07 LAB — POCT INR: INR: 4.5

## 2017-09-07 NOTE — Patient Instructions (Addendum)
____________________________________________________________________________________________  Appointment Policy Summary  It is our goal and responsibility to provide the medical community with assistance in the evaluation and management of patients with chronic pain. Unfortunately our resources are limited. Because we do not have an unlimited amount of time, or available appointments, we are required to closely monitor and manage their use. The following rules exist to maximize their use:  Patient's responsibilities: 1. Punctuality:  At what time should I arrive? You should be physically present in our office 30 minutes before your scheduled appointment. Your scheduled appointment is with your assigned healthcare provider. However, it takes 5-10 minutes to be "checked-in", and another 15 minutes for the nurses to do the admission. If you arrive to our office at the time you were given for your appointment, you will end up being at least 20-25 minutes late to your appointment with the provider. 2. Tardiness:  What happens if I arrive only a few minutes after my scheduled appointment time? You will need to reschedule your appointment. The cutoff is your appointment time. This is why it is so important that you arrive at least 30 minutes before that appointment. If you have an appointment scheduled for 10:00 AM and you arrive at 10:01, you will be required to reschedule your appointment.  3. Plan ahead:  Always assume that you will encounter traffic on your way in. Plan for it. If you are dependent on a driver, make sure they understand these rules and the need to arrive early. 4. Other appointments and responsibilities:  Avoid scheduling any other appointments before or after your pain clinic appointments.  5. Be prepared:  Write down everything that you need to discuss with your healthcare provider and give this information to the admitting nurse. Write down the medications that you will need  refilled. Bring your pills and bottles (even the empty ones), to all of your appointments, except for those where a procedure is scheduled. 6. No children or pets:  Find someone to take care of them. It is not appropriate to bring them in. 7. Scheduling changes:  We request "advanced notification" of any changes or cancellations. 8. Advanced notification:  Defined as a time period of more than 24 hours prior to the originally scheduled appointment. This allows for the appointment to be offered to other patients. 9. Rescheduling:  When a visit is rescheduled, it will require the cancellation of the original appointment. For this reason they both fall within the category of "Cancellations".  10. Cancellations:  They require advanced notification. Any cancellation less than 24 hours before the  appointment will be recorded as a "No Show". 11. No Show:  Defined as an unkept appointment where the patient failed to notify or declare to the practice their intention or inability to keep the appointment.  Corrective process for repeat offenders:  1. Tardiness: Three (3) episodes of rescheduling due to late arrivals will be recorded as one (1) "No Show". 2. Cancellation or reschedule: Three (3) cancellations or rescheduling will be recorded as one (1) "No Show". 3. "No Shows": Three (3) "No Shows" within a 12 month period will result in discharge from the practice.  ____________________________________________________________________________________________  ____________________________________________________________________________________________  Pain Scale  Introduction: The pain score used by this practice is the Verbal Numerical Rating Scale (VNRS-11). This is an 11-point scale. It is for adults and children 10 years or older. There are significant differences in how the pain score is reported, used, and applied. Forget everything you learned in the past and  learn this scoring  system.  General Information: The scale should reflect your current level of pain. Unless you are specifically asked for the level of your worst pain, or your average pain. If you are asked for one of these two, then it should be understood that it is over the past 24 hours.  Basic Activities of Daily Living (ADL): Personal hygiene, dressing, eating, transferring, and using restroom.  Instructions: Most patients tend to report their level of pain as a combination of two factors, their physical pain and their psychosocial pain. This last one is also known as "suffering" and it is reflection of how physical pain affects you socially and psychologically. From now on, report them separately. From this point on, when asked to report your pain level, report only your physical pain. Use the following table for reference.  Pain Clinic Pain Levels (0-5/10)  Pain Level Score  Description  No Pain 0   Mild pain 1 Nagging, annoying, but does not interfere with basic activities of daily living (ADL). Patients are able to eat, bathe, get dressed, toileting (being able to get on and off the toilet and perform personal hygiene functions), transfer (move in and out of bed or a chair without assistance), and maintain continence (able to control bladder and bowel functions). Blood pressure and heart rate are unaffected. A normal heart rate for a healthy adult ranges from 60 to 100 bpm (beats per minute).   Mild to moderate pain 2 Noticeable and distracting. Impossible to hide from other people. More frequent flare-ups. Still possible to adapt and function close to normal. It can be very annoying and may have occasional stronger flare-ups. With discipline, patients may get used to it and adapt.   Moderate pain 3 Interferes significantly with activities of daily living (ADL). It becomes difficult to feed, bathe, get dressed, get on and off the toilet or to perform personal hygiene functions. Difficult to get in and out of  bed or a chair without assistance. Very distracting. With effort, it can be ignored when deeply involved in activities.   Moderately severe pain 4 Impossible to ignore for more than a few minutes. With effort, patients may still be able to manage work or participate in some social activities. Very difficult to concentrate. Signs of autonomic nervous system discharge are evident: dilated pupils (mydriasis); mild sweating (diaphoresis); sleep interference. Heart rate becomes elevated (>115 bpm). Diastolic blood pressure (lower number) rises above 100 mmHg. Patients find relief in laying down and not moving.   Severe pain 5 Intense and extremely unpleasant. Associated with frowning face and frequent crying. Pain overwhelms the senses.  Ability to do any activity or maintain social relationships becomes significantly limited. Conversation becomes difficult. Pacing back and forth is common, as getting into a comfortable position is nearly impossible. Pain wakes you up from deep sleep. Physical signs will be obvious: pupillary dilation; increased sweating; goosebumps; brisk reflexes; cold, clammy hands and feet; nausea, vomiting or dry heaves; loss of appetite; significant sleep disturbance with inability to fall asleep or to remain asleep. When persistent, significant weight loss is observed due to the complete loss of appetite and sleep deprivation.  Blood pressure and heart rate becomes significantly elevated. Caution: If elevated blood pressure triggers a pounding headache associated with blurred vision, then the patient should immediately seek attention at an urgent or emergency care unit, as these may be signs of an impending stroke.    Emergency Department Pain Levels (6-10/10)  Emergency Room Pain 6   Severely limiting. Requires emergency care and should not be seen or managed at an outpatient pain management facility. Communication becomes difficult and requires great effort. Assistance to reach the  emergency department may be required. Facial flushing and profuse sweating along with potentially dangerous increases in heart rate and blood pressure will be evident.   Distressing pain 7 Self-care is very difficult. Assistance is required to transport, or use restroom. Assistance to reach the emergency department will be required. Tasks requiring coordination, such as bathing and getting dressed become very difficult.   Disabling pain 8 Self-care is no longer possible. At this level, pain is disabling. The individual is unable to do even the most "basic" activities such as walking, eating, bathing, dressing, transferring to a bed, or toileting. Fine motor skills are lost. It is difficult to think clearly.   Incapacitating pain 9 Pain becomes incapacitating. Thought processing is no longer possible. Difficult to remember your own name. Control of movement and coordination are lost.   The worst pain imaginable 10 At this level, most patients pass out from pain. When this level is reached, collapse of the autonomic nervous system occurs, leading to a sudden drop in blood pressure and heart rate. This in turn results in a temporary and dramatic drop in blood flow to the brain, leading to a loss of consciousness. Fainting is one of the body's self defense mechanisms. Passing out puts the brain in a calmed state and causes it to shut down for a while, in order to begin the healing process.    Summary: 1. Refer to this scale when providing [place] with your pain level. 2. Be accurate and careful when reporting your pain level. This will help with your care. 3. Over-reporting your pain level will lead to loss of credibility. 4. Even a level of 1/10 means that there is pain and will be treated at our facility. 5. High, inaccurate reporting will be documented as "Symptom Exaggeration", leading to loss of credibility and suspicions of possible secondary gains such as obtaining more narcotics, or wanting to appear  disabled, for fraudulent reasons. 6. Only pain levels of 5 or below will be seen at our facility. 7. Pain levels of 6 and above will be sent to the Emergency Department and the appointment cancelled. ____________________________________________________________________________________________  BMI Readings from Last 4 Encounters:  09/07/17 31.75 kg/m  08/24/17 32.19 kg/m  08/18/17 24.51 kg/m  08/17/17 24.34 kg/m

## 2017-09-07 NOTE — Progress Notes (Signed)
Safety precautions to be maintained throughout the outpatient stay will include: orient to surroundings, keep bed in low position, maintain call bell within reach at all times, provide assistance with transfer out of bed and ambulation.  

## 2017-09-07 NOTE — Progress Notes (Signed)
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 management facility. Communication becomes difficult and requires great effort. Assistance to reach the emergency department may be required. Facial flushing and profuse sweating along with potentially dangerous increases in heart rate and blood pressure will be evident.   Distressing pain 7 Self-care is very difficult. Assistance is required to transport, or use restroom. Assistance to reach the emergency department will be required. Tasks requiring coordination, such as bathing and getting dressed become very difficult.   Disabling pain 8 Self-care is no longer possible. At this level, pain is disabling. The individual is unable to do even the most "basic" activities such as walking, eating, bathing, dressing, transferring to a bed, or toileting. Fine motor skills are lost. It is difficult to think clearly.   Incapacitating pain 9 Pain becomes incapacitating. Thought processing is no longer possible. Difficult to remember your own name. Control of movement and coordination are lost.   The worst pain imaginable 10 At this level, most patients pass out from pain. When this level is reached, collapse of the autonomic nervous system occurs, leading to a sudden drop in blood pressure and heart rate. This in turn results in a temporary and dramatic drop in blood flow to the brain, leading to a loss of consciousness. Fainting is one of the body's self defense mechanisms. Passing out puts the brain in a calmed state and causes it to shut down for a while, in order to begin the healing process.    Summary: 1. Refer to this scale when providing [place] with your pain level. 2. Be accurate and careful when reporting your pain level. This will help with your care. 3. Over-reporting your pain level will lead to loss of credibility. 4. Even a level of 1/10 means that there is pain and will be treated at our facility. 5. High, inaccurate  reporting will be documented as "Symptom Exaggeration", leading to loss of credibility and suspicions of possible secondary gains such as obtaining more narcotics, or wanting to appear disabled, for fraudulent reasons. 6. Only pain levels of 5 or below will be seen at our facility. 7. Pain levels of 6 and above will be sent to the Emergency Department and the appointment cancelled. ____________________________________________________________________________________________  BMI Readings from Last 4 Encounters:  09/07/17 31.75 kg/m  08/24/17 32.19 kg/m  08/18/17 24.51 kg/m  08/17/17 24.34 kg/m

## 2017-09-11 LAB — COMP. METABOLIC PANEL (12)
AST: 17 IU/L (ref 0–40)
Albumin/Globulin Ratio: 1.7 (ref 1.2–2.2)
Albumin: 4 g/dL (ref 3.5–4.8)
Alkaline Phosphatase: 98 IU/L (ref 39–117)
BUN / CREAT RATIO: 14 (ref 10–24)
BUN: 14 mg/dL (ref 8–27)
Bilirubin Total: 0.3 mg/dL (ref 0.0–1.2)
CALCIUM: 9 mg/dL (ref 8.6–10.2)
Chloride: 104 mmol/L (ref 96–106)
Creatinine, Ser: 0.98 mg/dL (ref 0.76–1.27)
GFR, EST AFRICAN AMERICAN: 88 mL/min/{1.73_m2} (ref >59)
GFR, EST NON AFRICAN AMERICAN: 76 mL/min/{1.73_m2} (ref >59)
GLOBULIN, TOTAL: 2.3 g/dL (ref 1.5–4.5)
Glucose: 88 mg/dL (ref 65–99)
POTASSIUM: 4.8 mmol/L (ref 3.5–5.2)
Sodium: 141 mmol/L (ref 134–144)
TOTAL PROTEIN: 6.3 g/dL (ref 6.0–8.5)

## 2017-09-11 LAB — 25-HYDROXYVITAMIN D LCMS D2+D3: 25-HYDROXY, VITAMIN D-3: 28 ng/mL

## 2017-09-11 LAB — 25-HYDROXY VITAMIN D LCMS D2+D3
25-Hydroxy, Vitamin D-2: <1 ng/mL
25-Hydroxy, Vitamin D: 28 ng/mL — ABNORMAL LOW

## 2017-09-11 LAB — VITAMIN B12: VITAMIN B 12: 308 pg/mL (ref 232–1245)

## 2017-09-11 LAB — C-REACTIVE PROTEIN: CRP: 14.4 mg/L — ABNORMAL HIGH (ref 0.0–4.9)

## 2017-09-11 LAB — SEDIMENTATION RATE: Sed Rate: 40 mm/hr — ABNORMAL HIGH (ref 0–30)

## 2017-09-11 LAB — COMPLIANCE DRUG ANALYSIS, UR

## 2017-09-11 LAB — MAGNESIUM: Magnesium: 2.3 mg/dL (ref 1.6–2.3)

## 2017-09-14 ENCOUNTER — Ambulatory Visit (INDEPENDENT_AMBULATORY_CARE_PROVIDER_SITE_OTHER): Payer: Medicare Other | Admitting: Family Medicine

## 2017-09-14 ENCOUNTER — Encounter: Payer: Self-pay | Admitting: Family Medicine

## 2017-09-14 VITALS — BP 118/58 | HR 45 | Temp 97.8°F | Wt 215.5 lb

## 2017-09-14 DIAGNOSIS — Z711 Person with feared health complaint in whom no diagnosis is made: Secondary | ICD-10-CM
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
## 2017-09-14 NOTE — Assessment & Plan Note (Signed)
Prominent xyphoid process. Reassured no mass or abnormality. To let me know if enlarging or tender.

## 2017-09-15 ENCOUNTER — Telehealth: Payer: Self-pay | Admitting: Cardiovascular Disease

## 2017-09-15 NOTE — Telephone Encounter (Signed)
Appointment noted on the schedule. Pt will be seen by Anticoagulation RN [facility] tomorrow.

## 2017-09-15 NOTE — Telephone Encounter (Signed)
Patient wants to check coumadin asap doesn't feel well   Scheduled 10/10 at 730

## 2017-09-16 ENCOUNTER — Other Ambulatory Visit: Payer: Self-pay | Admitting: Family Medicine

## 2017-09-16 ENCOUNTER — Ambulatory Visit: Payer: Medicare Other | Admitting: *Deleted

## 2017-09-16 ENCOUNTER — Ambulatory Visit (INDEPENDENT_AMBULATORY_CARE_PROVIDER_SITE_OTHER): Payer: Medicare Other

## 2017-09-16 VITALS — BP 137/76 | HR 48 | Resp 18 | Ht 69.0 in | Wt 212.0 lb

## 2017-09-16 DIAGNOSIS — Z5181 Encounter for therapeutic drug level monitoring: Secondary | ICD-10-CM | POA: Diagnosis not present

## 2017-09-16 DIAGNOSIS — I480 Paroxysmal atrial fibrillation: Secondary | ICD-10-CM

## 2017-09-16 DIAGNOSIS — I4891 Unspecified atrial fibrillation: Secondary | ICD-10-CM

## 2017-09-16 DIAGNOSIS — Z789 Other specified health status: Secondary | ICD-10-CM

## 2017-09-16 LAB — POCT INR: INR: 1.6

## 2017-09-16 NOTE — Patient Instructions (Addendum)
Your physician has requested that you regularly monitor and record your blood pressure readings at home. Please use the same machine at the same time of day to check your readings and record them to bring to your follow-up visit.  Please keep a log of your blood pressure readings and call if blood pressures are low or 140/80 or higher.   It was a pleasure seeing you today here in the office. Please do not hesitate to give [place] a call back if you have any further questions. [place], BSN    How to Take Your Blood Pressure You can take your blood pressure at home with a machine. You may need to check your blood pressure at home:  To check if you have high blood pressure (hypertension).  To check your blood pressure over time.  To make sure your blood pressure medicine is working.  Supplies needed: You will need a blood pressure machine, or monitor. You can buy one at a drugstore or online. When choosing one:  Choose one with an arm cuff.  Choose one that wraps around your upper arm. Only one finger should fit between your arm and the cuff.  Do not choose one that measures your blood pressure from your wrist or finger.  Your doctor can suggest a monitor. How to prepare Avoid these things for 30 minutes before checking your blood pressure:  Drinking caffeine.  Drinking alcohol.  Eating.  Smoking.  Exercising.  Five minutes before checking your blood pressure:  Pee.  Sit in a dining chair. Avoid sitting in a soft couch or armchair.  Be quiet. Do not talk.  How to take your blood pressure Follow the instructions that came with your machine. If you have a digital blood pressure monitor, these may be the instructions: 1. Sit up straight. 2. Place your feet on the floor. Do not cross your ankles or legs. 3. Rest your left arm at the level of your heart. You may rest it on a table, desk, or chair. 4. Pull up your shirt sleeve. 5. Wrap the blood pressure  cuff around the upper part of your left arm. The cuff should be 1 inch (2.5 cm) above your elbow. It is best to wrap the cuff around bare skin. 6. Fit the cuff snugly around your arm. You should be able to place only one finger between the cuff and your arm. 7. Put the cord inside the groove of your elbow. 8. Press the power button. 9. Sit quietly while the cuff fills with air and loses air. 10. Write down the numbers on the screen. 11. Wait 2-3 minutes and then repeat steps 1-10.  What do the numbers mean? Two numbers make up your blood pressure. The first number is called systolic pressure. The second is called diastolic pressure. An example of a blood pressure reading is "120 over 80" (or 120/80). If you are an adult and do not have a medical condition, use this guide to find out if your blood pressure is normal: Normal  First number: below 120.  Second number: below 80. Elevated  First number: 120-129.  Second number: below 80. Hypertension stage 1  First number: 130-139.  Second number: 80-89. Hypertension stage 2  First number: 140 or above.  Second number: 64 or above. Your blood pressure is above normal even if only the top or bottom number is above normal. Follow these instructions at home:  Check your blood pressure as often as your  doctor tells you to.  Take your monitor to your next doctor's appointment. Your doctor will: ? Make sure you are using it correctly. ? Make sure it is working right.  Make sure you understand what your blood pressure numbers should be.  Tell your doctor if your medicines are causing side effects. Contact a doctor if:  Your blood pressure keeps being high. Get help right away if:  Your first blood pressure number is higher than 180.  Your second blood pressure number is higher than 120. This information is not intended to replace advice given to you by your health care provider. Make sure you discuss any questions you have with  your health care provider. Document Released: 11/06/2008 Document Revised: 10/22/2016 Document Reviewed: 05/02/2016 Elsevier Interactive Patient Education  2018 [place]. Blood Pressure Record Sheet Your blood pressure on this visit to the emergency department or clinic is elevated. This does not necessarily mean you have high blood pressure (hypertension), but it does mean that your blood pressure needs to be rechecked. Many times your blood pressure can increase due to illness, pain, anxiety, or other factors. We recommend that you get a series of blood pressure readings done over a period of 5 days. It is best to get a reading in the morning and one in the evening. You should make sure to sit and relax for 1-5 minutes before the reading is taken. Write the readings down and make a follow-up appointment with your health care provider to discuss the results. If there is not a free clinic or a drug store with a blood-pressure-taking machine near you, you can purchase blood-pressure-taking equipment from a drug store. Having one in the home allows you the convenience of taking your blood pressure while you are home and relaxed. Blood Pressure Log Date: _______________________  a.m. _____________________  p.m. _____________________  Date: _______________________  a.m. _____________________  p.m. _____________________  Date: _______________________  a.m. _____________________  p.m. _____________________  Date: _______________________  a.m. _____________________  p.m. _____________________  Date: _______________________  a.m. _____________________  p.m. _____________________  This information is not intended to replace advice given to you by your health care provider. Make sure you discuss any questions you have with your health care provider. Document Released: 08/23/2003 Document Revised: 11/07/2016 Document Reviewed: 01/17/2014 Elsevier Interactive Patient Education  2018  [employer].

## 2017-09-16 NOTE — Progress Notes (Signed)
1.) Reason for visit: Patient in for coumadin check and reports not feeling well with low diastolic blood pressure readings  2.) Name of MD: Dr. Rockey Situ  3.) H&P: Bradycardia, CHF, AFib, COPD  4.) ROS related to problem: Patient states that he feels better today than he has in a while. He wanted to report his low diastolic readings of mid 50-15'A.   5.) Assessment and plan per MD: Today his blood pressure was 137/76 with heart rate of 48. Advised patient to start monitoring blood pressures at home and to keep a log of those readings with instructions to call if blood pressures are concerning for either high or low. Reviewed all medications which can cause him fatigue and sluggish feeling as well. He verbalized understanding of our conversation, advised him to continue monitoring pressures, and he was agreeable with plan. Will have him schedule follow up appointment as well.

## 2017-09-21 ENCOUNTER — Ambulatory Visit (INDEPENDENT_AMBULATORY_CARE_PROVIDER_SITE_OTHER): Payer: Medicare Other

## 2017-09-21 DIAGNOSIS — I4891 Unspecified atrial fibrillation: Secondary | ICD-10-CM

## 2017-09-21 DIAGNOSIS — Z5181 Encounter for therapeutic drug level monitoring: Secondary | ICD-10-CM | POA: Diagnosis not present

## 2017-09-21 LAB — POCT INR: INR: 2.3

## 2017-09-24 ENCOUNTER — Other Ambulatory Visit: Payer: Self-pay | Admitting: Family Medicine

## 2017-09-25 NOTE — Telephone Encounter (Signed)
Refill left on vm at pharmacy.  

## 2017-09-25 NOTE — Telephone Encounter (Signed)
Last filled:  08/13/17, #20 Last OV:  09/14/17 Next OV: none

## 2017-09-25 NOTE — Telephone Encounter (Signed)
Plz phone in

## 2017-09-27 ENCOUNTER — Other Ambulatory Visit: Payer: Self-pay | Admitting: Family Medicine

## 2017-09-27 ENCOUNTER — Other Ambulatory Visit: Payer: Self-pay | Admitting: Cardiovascular Disease

## 2017-09-28 NOTE — Telephone Encounter (Signed)
Copied from [place] #[id number]. Topic: Quick Communication - See Telephone Encounter >> Sep 28, 2017  8:36 AM Ahmed Prima L wrote: CRM for notification. See Telephone encounter for:  09/28/17.

## 2017-09-29 ENCOUNTER — Telehealth: Payer: Self-pay | Admitting: Family Medicine

## 2017-09-29 ENCOUNTER — Telehealth: Payer: Self-pay | Admitting: Cardiovascular Disease

## 2017-09-29 NOTE — Telephone Encounter (Signed)
Pt wife calling asking since patient has a cold  She would like to know what kind of over the counter medications can he take   Please call back

## 2017-09-29 NOTE — Telephone Encounter (Signed)
Spoke w/ [name].  Advised her that pt may take OTC meds w/o decongestants. 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 She advised him to take a diltiazem to get his HR down, but advised against this, as this may drop his BP down too low.  Discussed w/ her normal parameters for BP and HR and that pt's HR may run a bit higher since he is a heavy smoker and nicotine is a stimulant.  Advised her to make sure that pt is staying hydrated, esp in setting of BP, as we don't want this to drop and him become dizzy while he is driving.  She verbalizes understanding, is appreciative and will call back if we can be of further assistance.

## 2017-10-01 NOTE — Telephone Encounter (Signed)
Spoke with [facility], confirmed pt did receive 90 day supply in 08/2017.

## 2017-10-01 NOTE — Telephone Encounter (Signed)
plz call pt or pharmacy to verify - he should have received 3 month supply sent in 09/01/2017 so too soon to refill?

## 2017-10-02 ENCOUNTER — Telehealth: Payer: Self-pay | Admitting: Family Medicine

## 2017-10-02 ENCOUNTER — Other Ambulatory Visit: Payer: Self-pay | Admitting: Family Medicine

## 2017-10-02 MED ORDER — HYDROCODONE-ACETAMINOPHEN 10-325 MG PO TABS: 1.0000 | ORAL_TABLET | Freq: Three times a day (TID) | ORAL | 0 refills | Status: DC | PRN

## 2017-10-02 MED ORDER — TIZANIDINE HCL 4 MG PO TABS: 4.0000 mg | ORAL_TABLET | Freq: Two times a day (BID) | ORAL | 0 refills | Status: DC | PRN

## 2017-10-02 NOTE — Telephone Encounter (Addendum)
Sent in. Pharmacy says they didn't received #180 sent in 09/01/2017.  plz notify patient.

## 2017-10-02 NOTE — Telephone Encounter (Signed)
plz call pharmacy to verify but it looks like #60 was dispensed 08/31/2017 and there should be 3 refills available at pharmacy.

## 2017-10-02 NOTE — Telephone Encounter (Signed)
Spoke with [facility], says the rx was from 05/2017 with 3 refills so pt picked up the last refill. Requesting a 90 day rx.

## 2017-10-02 NOTE — Addendum Note (Signed)
Addended by: Ria Bush on: 10/02/2017 12:42 PM   Modules accepted: Orders

## 2017-10-02 NOTE — Telephone Encounter (Signed)
Please see the attached request for [name].  He would like to be called.

## 2017-10-02 NOTE — Telephone Encounter (Signed)
Copied from [place] #[id number]. Topic: Quick Communication - See Telephone Encounter >> Oct 02, 2017  1:41 PM Bea Graff, NT wrote: CRM for notification. See Telephone encounter for:  10/02/17. Patients wife called and stated that the pharmacy did not receive the rx for muscle relaxer and needs it to be resent. Please give wife a call once this rx has been sent over.

## 2017-10-02 NOTE — Telephone Encounter (Signed)
hydrocodone refilled - per wife request - she came in to office today, states this was requested Monday, I never received request.  Cc Erin as fyi.

## 2017-10-05 NOTE — Telephone Encounter (Signed)
Spoke with pt's wife, Barnetta Chapel (on dpr), notifying her rx was sent to pharmacy. Says ok and expresses her thanks.

## 2017-10-09 ENCOUNTER — Ambulatory Visit (INDEPENDENT_AMBULATORY_CARE_PROVIDER_SITE_OTHER): Payer: Medicare Other | Admitting: Internal Medicine

## 2017-10-09 ENCOUNTER — Encounter: Payer: Self-pay | Admitting: Internal Medicine

## 2017-10-09 VITALS — BP 108/64 | HR 53 | Temp 97.4°F | Wt 204.0 lb

## 2017-10-09 DIAGNOSIS — E1165 Type 2 diabetes mellitus with hyperglycemia: Secondary | ICD-10-CM

## 2017-10-09 DIAGNOSIS — I495 Sick sinus syndrome: Secondary | ICD-10-CM

## 2017-10-09 DIAGNOSIS — E1140 Type 2 diabetes mellitus with diabetic neuropathy, unspecified: Secondary | ICD-10-CM

## 2017-10-09 DIAGNOSIS — R42 Dizziness and giddiness: Secondary | ICD-10-CM | POA: Diagnosis not present

## 2017-10-09 DIAGNOSIS — R197 Diarrhea, unspecified: Secondary | ICD-10-CM | POA: Diagnosis not present

## 2017-10-09 DIAGNOSIS — IMO0002 Reserved for concepts with insufficient information to code with codable children: Secondary | ICD-10-CM

## 2017-10-09 LAB — COMPREHENSIVE METABOLIC PANEL
ALBUMIN: 3.7 g/dL (ref 3.5–5.2)
ALK PHOS: 75 U/L (ref 39–117)
ALT: 8 U/L (ref 0–53)
AST: 11 U/L (ref 0–37)
BILIRUBIN TOTAL: 0.5 mg/dL (ref 0.2–1.2)
BUN: 12 mg/dL (ref 6–23)
CALCIUM: 9 mg/dL (ref 8.4–10.5)
CO2: 30 mEq/L (ref 19–32)
Chloride: 103 mEq/L (ref 96–112)
Creatinine, Ser: 1.03 mg/dL (ref 0.40–1.50)
GFR: 75.15 mL/min (ref >60.00)
Glucose, Bld: 139 mg/dL — ABNORMAL HIGH (ref 70–99)
Potassium: 4.3 mEq/L (ref 3.5–5.1)
Sodium: 139 mEq/L (ref 135–145)
TOTAL PROTEIN: 6.6 g/dL (ref 6.0–8.3)

## 2017-10-09 LAB — CBC
HCT: 35.6 % — ABNORMAL LOW (ref 39.0–52.0)
Hemoglobin: 10.7 g/dL — ABNORMAL LOW (ref 13.0–17.0)
MCHC: 30.1 g/dL (ref 30.0–36.0)
MCV: 69.9 fl — ABNORMAL LOW (ref 78.0–100.0)
PLATELETS: 456 10*3/uL — AB (ref 150.0–400.0)
RBC: 5.09 Mil/uL (ref 4.22–5.81)
RDW: 19.2 % — ABNORMAL HIGH (ref 11.5–15.5)
WBC: 8.4 10*3/uL (ref 4.0–10.5)

## 2017-10-09 LAB — POCT CBG (FASTING - GLUCOSE)-MANUAL ENTRY: Glucose Fasting, POC: 154 mg/dL — AB (ref 70–99)

## 2017-10-09 LAB — HEMOGLOBIN A1C: Hgb A1c MFr Bld: 7.1 % — ABNORMAL HIGH (ref 4.6–6.5)

## 2017-10-09 LAB — T4, FREE: Free T4: 1.07 ng/dL (ref 0.60–1.60)

## 2017-10-09 NOTE — Patient Instructions (Signed)
Please stop the furosemide and the metformin for now.

## 2017-10-09 NOTE — Assessment & Plan Note (Signed)
Lab Results  Component Value Date   HGBA1C 8.0 (H) 07/16/2016   Will check A1c  Hold metformin for now to see if bowels better Follow up with Dr Darnell Level soon

## 2017-10-09 NOTE — Assessment & Plan Note (Signed)
Still bradycardic despite dehydration (of unclear degree) Off beta blockers Claims he wouldn't consider any surgery--even just pacemaker (but should be considered) Going back to Dr Rockey Situ soon
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
## 2017-10-09 NOTE — Assessment & Plan Note (Signed)
Chronic ?related to metformin Now some dehydration and unable to raise pulse Not clear how much hypovolemia he has---- won't go to ER Will hold furosemide also
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
## 2017-10-09 NOTE — Progress Notes (Signed)
Triage Can we arrange appointment with Dr. Caryl Comes this Wednesday, November 7 for discussion of pacer, atrial fibrillation thx TG

## 2017-10-12 ENCOUNTER — Encounter: Payer: Self-pay | Admitting: Cardiovascular Disease

## 2017-10-12 ENCOUNTER — Ambulatory Visit (INDEPENDENT_AMBULATORY_CARE_PROVIDER_SITE_OTHER): Payer: Medicare Other | Admitting: Cardiovascular Disease

## 2017-10-12 ENCOUNTER — Telehealth: Payer: Self-pay | Admitting: *Deleted

## 2017-10-12 VITALS — BP 112/50 | HR 45 | Ht 72.0 in | Wt 212.0 lb

## 2017-10-12 DIAGNOSIS — IMO0002 Reserved for concepts with insufficient information to code with codable children: Secondary | ICD-10-CM

## 2017-10-12 DIAGNOSIS — J449 Chronic obstructive pulmonary disease, unspecified: Secondary | ICD-10-CM | POA: Diagnosis not present

## 2017-10-12 DIAGNOSIS — E1165 Type 2 diabetes mellitus with hyperglycemia: Secondary | ICD-10-CM

## 2017-10-12 DIAGNOSIS — I480 Paroxysmal atrial fibrillation: Secondary | ICD-10-CM

## 2017-10-12 DIAGNOSIS — I495 Sick sinus syndrome: Secondary | ICD-10-CM | POA: Diagnosis not present

## 2017-10-12 DIAGNOSIS — E782 Mixed hyperlipidemia: Secondary | ICD-10-CM

## 2017-10-12 DIAGNOSIS — I714 Abdominal aortic aneurysm, without rupture, unspecified: Secondary | ICD-10-CM

## 2017-10-12 DIAGNOSIS — E1140 Type 2 diabetes mellitus with diabetic neuropathy, unspecified: Secondary | ICD-10-CM

## 2017-10-12 NOTE — Telephone Encounter (Signed)
-----   Message from Minna Merritts, MD sent at 10/09/2017  6:16 PM EDT -----   ----- Message ----- From: Venia Carbon, MD Sent: 10/09/2017  10:24 AM To: Minna Merritts, MD  Tim,  I think he should be considered for pacer (though he doesn't want any type of surgery)  Rich

## 2017-10-12 NOTE — Telephone Encounter (Signed)
S/w patient's wife ok per DPR. She verbalized understanding of appointment date and time on 10/14/17. Explained the reason for appendicitis with Dr Caryl Comes as follows from Dr Silvio Pate: "Still bradycardic despite dehydration (of unclear degree) Off beta blockers Claims he wouldn't consider any surgery--even just pacemaker (but should be considered) Going back to Dr Rockey Situ soon"  She verbalized understanding.

## 2017-10-12 NOTE — Patient Instructions (Addendum)
Medication Instructions:   Please cut the amiodarone in  1/2 daily   Labwork:  No new labs needed  Testing/Procedures:  No further testing at this time   Follow-Up: It was a pleasure seeing you in the office today. Please call us if you have new issues that need to be addressed before your next appt.  [phone]  Your physician wants you to follow-up in: 6 months.  You will receive a reminder letter in the mail two months in advance. If you don't receive a letter, please call our office to schedule the follow-up appointment.  If you need a refill on your cardiac medications before your next appointment, please call your pharmacy.

## 2017-10-14 ENCOUNTER — Ambulatory Visit: Payer: Medicare Other | Admitting: Internal Medicine

## 2017-10-15 ENCOUNTER — Ambulatory Visit: Payer: Medicare Other | Admitting: Internal Medicine

## 2017-10-17 ENCOUNTER — Other Ambulatory Visit: Payer: Self-pay | Admitting: Family Medicine

## 2017-10-17 DIAGNOSIS — R197 Diarrhea, unspecified: Secondary | ICD-10-CM

## 2017-10-19 NOTE — Telephone Encounter (Signed)
Last filled:  09/16/17, #120 Last OV:  10/09/17 Next OV:  None  Fwd to Dr. Damita Dunnings in Dr. Synthia Innocent absence.

## 2017-10-19 NOTE — Telephone Encounter (Signed)
Refilled. plz call and schedule medicare wellness visit with Lesia after 12/09/2017 and would offer DM, anemia and diarrhea f/u with me.

## 2017-10-21 ENCOUNTER — Ambulatory Visit (INDEPENDENT_AMBULATORY_CARE_PROVIDER_SITE_OTHER): Payer: Medicare Other | Admitting: *Deleted

## 2017-10-21 ENCOUNTER — Ambulatory Visit (INDEPENDENT_AMBULATORY_CARE_PROVIDER_SITE_OTHER): Payer: Medicare Other

## 2017-10-21 VITALS — BP 110/48 | HR 57

## 2017-10-21 DIAGNOSIS — Z5181 Encounter for therapeutic drug level monitoring: Secondary | ICD-10-CM

## 2017-10-21 DIAGNOSIS — I4891 Unspecified atrial fibrillation: Secondary | ICD-10-CM | POA: Diagnosis not present

## 2017-10-21 LAB — POCT INR: INR: 2.6

## 2017-10-21 NOTE — Progress Notes (Signed)
1.) Reason for visit: BP and heart rate check  2.) Name of MD requesting visit: Dr Rockey Situ  3.) H&P: atrial fibrillation  4.) ROS related to problem: Patient here in office for coumadin check today. Expressed his heart rate has decreased to the 40's on 3 occasions since last office visit on 10/12/17 with Dr Rockey Situ. At last OV, Dr Rockey Situ decreased amiodarone to 100 mg daily. Patient states when heart rate drops he feels tired, weak and dizzy. Today, he has no complaints and states "I feel fine." He is scheduled to leave today to go out of town to [place]. BP 110/48, HR 57 today.   5.) Assessment and plan per MD: Reviewed findings with Dr Rockey Situ. No new orders at this time and patient is to continue current medications. Patient verbalized understanding and will continue to mointor.

## 2017-10-21 NOTE — Telephone Encounter (Signed)
Left message on vm per dpr notifying pt of upcoming appts: 12/10/2017 with Katha Cabal for Medicare Wellness and 12/17/2017 with Dr. Danise Mina for follow up.   Also, mailing appt reminders to pt.

## 2017-10-25 ENCOUNTER — Other Ambulatory Visit: Payer: Self-pay | Admitting: Cardiovascular Disease

## 2017-10-26 ENCOUNTER — Ambulatory Visit: Payer: Medicare Other | Admitting: Cardiovascular Disease

## 2017-10-26 ENCOUNTER — Ambulatory Visit: Payer: Medicare Other

## 2017-10-26 ENCOUNTER — Ambulatory Visit: Payer: Medicare Other | Admitting: Family Medicine

## 2017-10-27 ENCOUNTER — Encounter: Payer: Self-pay | Admitting: Family Medicine

## 2017-10-27 ENCOUNTER — Ambulatory Visit (INDEPENDENT_AMBULATORY_CARE_PROVIDER_SITE_OTHER): Payer: Medicare Other | Admitting: Family Medicine

## 2017-10-27 VITALS — BP 118/60 | HR 60 | Temp 97.9°F | Wt 205.0 lb

## 2017-10-27 DIAGNOSIS — I495 Sick sinus syndrome: Secondary | ICD-10-CM

## 2017-10-27 DIAGNOSIS — G894 Chronic pain syndrome: Secondary | ICD-10-CM | POA: Diagnosis not present

## 2017-10-27 DIAGNOSIS — I4891 Unspecified atrial fibrillation: Secondary | ICD-10-CM

## 2017-10-27 DIAGNOSIS — R197 Diarrhea, unspecified: Secondary | ICD-10-CM | POA: Diagnosis not present

## 2017-10-27 DIAGNOSIS — G8929 Other chronic pain: Secondary | ICD-10-CM | POA: Diagnosis not present

## 2017-10-27 DIAGNOSIS — R634 Abnormal weight loss: Secondary | ICD-10-CM

## 2017-10-27 DIAGNOSIS — F1120 Opioid dependence, uncomplicated: Secondary | ICD-10-CM

## 2017-10-27 MED ORDER — HYDROCODONE-ACETAMINOPHEN 10-325 MG PO TABS: 1.0000 | ORAL_TABLET | Freq: Three times a day (TID) | ORAL | 0 refills | Status: DC | PRN

## 2017-10-27 MED ORDER — NITROGLYCERIN 0.4 MG/SPRAY TL SOLN: 1.0000 | 3 refills | Status: DC | PRN

## 2017-10-27 NOTE — Assessment & Plan Note (Signed)
Feels better on lower amiodarone dose.

## 2017-10-27 NOTE — Assessment & Plan Note (Signed)
Chronic episodic flares associated with abd pain, nausea, and noted weight loss. Also noted IDA. Again encouraged he complete iFOB as well as GI pathogen panel. Will refer to GI for further evaluation.  Pt adamant about not wanting sedation but willing to discuss management options with GI.

## 2017-10-27 NOTE — Assessment & Plan Note (Addendum)
Progressive - 30 lbs down over last 6 months. Newly noted microcytic anemia.

## 2017-10-27 NOTE — Patient Instructions (Signed)
Pass by lab for stool tests. Bring those back. Call me with name of diarrhea pill. I will refer you back to GI for further evaluation.

## 2017-10-27 NOTE — Assessment & Plan Note (Signed)
Requests refill of hydrocodone. [place] CSRS reviewed.

## 2017-10-27 NOTE — Progress Notes (Signed)
BP 118/60 (BP Location: Left Arm, Patient Position: Sitting, Cuff Size: Normal)   Pulse 60   Temp 97.9 F (36.6 C) (Oral)   Wt 205 lb (93 kg)   SpO2 99%   BMI 27.80 kg/m    CC: diarrhea/vomiting Subjective:    Patient ID: [name], male    DOB: [birth year], 71 y.o.   MRN: [id number]  HPI: [name] is a 71 y.o. 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 Had stomach cramps then vomiting and diarrhea. Thinks had food poisoning. Has not taken anything.); Diarrhea; and Weight Loss (concerned about wt loss)   See recent notes for details. Pt endorses longstanding history of GI difficulty "recurrent food poisoning". Seen here earlier this month for diarrhea - metformin and lasix were held. This helped some. 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 Latest episode was this past Friday - after eating [employer] whopper had episode of abdominal pain, vomiting, diarrhea associated with urgency. Was sick all day. Feels he is staying well hydrated with water and gatorade. Progressive weight loss over the past year 30 lbs over last 6 months. Foods on the road tend to affect him more than food at home.   No fevers with this. No blood in stool or emesis. No urinary sxs. No dysphagia. No early satiety.   Bradycardia with SSS - pt has refused pacer. Metoprolol has been held, amiodarone decreased.   Stool culture, C diff neg in the past. Did not complete prior iFOB or GI pathogen panel.  Normal air contrast barium enema 2014 [facility]) - at that time pt declined colonoscopy.  Adamant about "not being put to sleep".   Reviewed CTA abd/pelvis from 07/2016.   Relevant past medical, surgical, family and social history reviewed and updated as indicated. Interim medical history since our last visit reviewed. Allergies and medications reviewed and  updated. Outpatient Medications Prior to Visit  Medication Sig Dispense Refill  . amiodarone (PACERONE) 200 MG tablet TAKE 1 TABLET(200 MG) BY MOUTH DAILY (Patient taking differently: TAKE 1/2 TABLET(100 MG) BY MOUTH DAILY) 90 tablet 0  . Coenzyme Q10 100 MG capsule Take 1 capsule (100 mg total) by mouth daily. 30 capsule 0  . diclofenac sodium (VOLTAREN) 1 % GEL Apply 2 g topically 3 (three) times daily. 100 g 1  . docusate sodium (COLACE) 100 MG capsule Take 100 mg by mouth 2 (two) times daily.    . fish oil-omega-3 fatty acids 1000 MG capsule Take 2 g by mouth daily.     Marland Kitchen gabapentin (NEURONTIN) 600 MG tablet TAKE 1 TABLET BY MOUTH FOUR TIMES DAILY 120 tablet 6  . glucose blood (ONE TOUCH ULTRA TEST) test strip Check blood sugar once daily and as needed. 250.60 100 each 3  . Glucose Blood (ONETOUCH ULTRA BLUE VI) by In Vitro route. Test blood glucose 1 time per day and as directed.     Marland Kitchen omeprazole (PRILOSEC) 40 MG capsule TAKE 1 CAPSULE BY MOUTH EVERY DAY 90 capsule 3  . potassium chloride (K-DUR,KLOR-CON) 10 MEQ tablet Take 2 tablets (20 mEq total) 2 (two) times daily by mouth. 120 tablet 2  . PROAIR HFA 108 (90 Base) MCG/ACT inhaler INHALE 2 PUFFS BY MOUTH EVERY 6 HOURS AS NEEDED FOR WHEEZING OR SHORTNESS OF BREATH 17 g 5  . tiZANidine (ZANAFLEX) 4 MG tablet Take 1  tablet (4 mg total) by mouth 2 (two) times daily as needed for muscle spasms. 180 tablet 0  . warfarin (COUMADIN) 5 MG tablet TAKE AS DIRECTED BY COUMADIN CLINIC 100 tablet 0  . HYDROcodone-acetaminophen (NORCO) 10-325 MG tablet Take 1 tablet by mouth 3 (three) times daily as needed for moderate pain. 90 tablet 0  . nitroGLYCERIN (NITROLINGUAL) 0.4 MG/SPRAY spray Place 1 spray under the tongue every 5 (five) minutes as needed. 12 g 3  . amLODipine (NORVASC) 5 MG tablet Take 1 tablet (5 mg total) by mouth daily as needed. (Patient not taking: Reported on 10/21/2017) 90 tablet 3  . budesonide-formoterol (SYMBICORT) 160-4.5 MCG/ACT  inhaler Inhale 1 puff into the lungs 2 (two) times daily.    . fluticasone furoate-vilanterol (BREO ELLIPTA) 200-25 MCG/INH AEPB Inhale 1 puff into the lungs daily. 1 each 3  . Fluticasone-Salmeterol (ADVAIR DISKUS) 250-50 MCG/DOSE AEPB Inhale 1 puff into the lungs 2 (two) times daily. 1 each 6  . JANUVIA 50 MG tablet TAKE 1 TABLET(50 MG) BY MOUTH DAILY 30 tablet 6   No facility-administered medications prior to visit.      Per HPI unless specifically indicated in ROS section below Review of Systems     Objective:    BP 118/60 (BP Location: Left Arm, Patient Position: Sitting, Cuff Size: Normal)   Pulse 60   Temp 97.9 F (36.6 C) (Oral)   Wt 205 lb (93 kg)   SpO2 99%   BMI 27.80 kg/m   Wt Readings from Last 3 Encounters:  10/27/17 205 lb (93 kg)  10/12/17 212 lb (96.2 kg)  10/09/17 204 lb (92.5 kg)    Physical Exam  Constitutional: He appears well-developed and well-nourished. No distress.  Using cane today  HENT:  Mouth/Throat: Oropharynx is clear and moist. No oropharyngeal exudate.  Cardiovascular: Normal rate, regular rhythm, normal heart sounds and intact distal pulses.  No murmur heard. Pulmonary/Chest: Effort normal and breath sounds normal. No respiratory distress. He has no wheezes. He has no rales.  Abdominal: Soft. Normal appearance and bowel sounds are normal. He exhibits no distension and no mass. There is no hepatosplenomegaly. There is no tenderness. There is no rigidity, no rebound, no guarding and negative Murphy's sign.  Musculoskeletal: He exhibits no edema.  Psychiatric: He has a normal mood and affect.  Nursing note and vitals reviewed.  Results for orders placed or performed in visit on 10/21/17  POCT INR  Result Value Ref Range   INR 2.6    Lab Results  Component Value Date   HGBA1C 7.1 (H) 10/09/2017       Assessment & Plan:   Problem List Items Addressed This Visit    Atrial fibrillation (HCC)   Relevant Medications   nitroGLYCERIN  (NITROLINGUAL) 0.4 MG/SPRAY spray   Chronic pain syndrome (Chronic)   Diarrhea - Primary    Chronic episodic flares associated with abd pain, nausea, and noted weight loss. Also noted IDA. Again encouraged he complete iFOB as well as GI pathogen panel. Will refer to GI for further evaluation.  Pt adamant about not wanting sedation but willing to discuss management options with GI.       Relevant Orders   Fecal occult blood, imunochemical   Gastrointestinal Pathogen Panel PCR   Ambulatory referral to Gastroenterology   Encounter for chronic pain management    Requests refill of hydrocodone. [place] CSRS reviewed.        Opiate dependence (HCC)   Sick sinus syndrome ([place])  Feels better on lower amiodarone dose.       Relevant Medications   nitroGLYCERIN (NITROLINGUAL) 0.4 MG/SPRAY spray   Weight loss    Progressive - 30 lbs down over last 6 months. Newly noted microcytic anemia.           Follow up plan: No Follow-up on file.  Ria Bush, MD

## 2017-10-28 ENCOUNTER — Other Ambulatory Visit: Payer: Self-pay | Admitting: Family Medicine

## 2017-10-28 MED ORDER — DIPHENOXYLATE-ATROPINE 2.5-0.025 MG PO TABS: 1.0000 | ORAL_TABLET | Freq: Two times a day (BID) | ORAL | 0 refills | Status: DC | PRN

## 2017-10-28 NOTE — Telephone Encounter (Signed)
plz phone in. 

## 2017-10-28 NOTE — Telephone Encounter (Signed)
Refill left on vm at pharmacy per Dr. G.   

## 2017-10-28 NOTE — Telephone Encounter (Signed)
Copied from [place] [id number]. Topic: General - Other >> Oct 28, 2017  8:05 AM Lennox Solders wrote: Reason for CRM: pt saw dr Danise Mina on 10-27-17. Pt needs new rx diphenoxylate-atropine for diarrhea. [address] [phone]

## 2017-10-28 NOTE — Telephone Encounter (Signed)
Pt requesting rx for diphenoxylate-atropine. Last refilled # 20 on 09/25/17; pt last seen 10/27/17.Please advise.

## 2017-10-28 NOTE — Telephone Encounter (Signed)
See attached. Saw Dr. Danise Mina this week.

## 2017-11-02 ENCOUNTER — Ambulatory Visit (INDEPENDENT_AMBULATORY_CARE_PROVIDER_SITE_OTHER): Payer: Medicare Other

## 2017-11-02 DIAGNOSIS — I4891 Unspecified atrial fibrillation: Secondary | ICD-10-CM

## 2017-11-02 DIAGNOSIS — Z5181 Encounter for therapeutic drug level monitoring: Secondary | ICD-10-CM | POA: Diagnosis not present

## 2017-11-02 LAB — POCT INR: INR: 2.6

## 2017-11-02 NOTE — Patient Instructions (Addendum)
Continue taking 1/2 tablet every day except 1 tablet on Mondays, Wednesdays and Fridays.  Remain consistent with dark green leafy veggies.   Recheck in 4 weeks.

## 2017-11-11 ENCOUNTER — Other Ambulatory Visit: Payer: Self-pay | Admitting: Cardiovascular Disease

## 2017-11-11 NOTE — Telephone Encounter (Signed)
  Per last office visit with Gollan:  Medication Instructions:   Please cut the amiodarone in  1/2 daily   Is this okay to refill without the instructions saying 1/2 tablet

## 2017-11-11 NOTE — Telephone Encounter (Signed)
Please refill.

## 2017-11-11 NOTE — Telephone Encounter (Signed)
1/2 tablet once daily. Thanks

## 2017-11-15 ENCOUNTER — Other Ambulatory Visit: Payer: Self-pay | Admitting: Cardiovascular Disease

## 2017-11-20 DIAGNOSIS — F4320 Adjustment disorder, unspecified: Secondary | ICD-10-CM | POA: Diagnosis not present

## 2017-11-25 ENCOUNTER — Telehealth: Payer: Self-pay | Admitting: Cardiovascular Disease

## 2017-11-25 NOTE — Telephone Encounter (Signed)
S/w patient's wife, ok per DPR. She gave me brief information about patient having heart rate in the 90's and not feeling well. I suggested I call the patient for more information and she was agreeable.  S/w patient. His HR has been in the 90's (95-97) which he feels is increased. Patient states Dr Rockey Situ did not want his heart rate above 90. When HR is 97, Patient states he feels some weakness and does not feel as good as he does when his HR is in the 60-70 range. Denies chest pain, dizziness, or palpitations.   Had an episode last night where HR was 97 and he was short of breath.  He's currently taking Amiodarone 200 mg by mouth two times a day. During the day he takes Diltiazem 30 mg and another amiodarone 200 mg if his heart rate goes into the 90's. He has been doing this 2-3 times a day over the few days or week.  In the past, HR has been in the 30-40's, medications were adjusted.   S/w with Dr Caryl Comes concerning patient. In reviewing patient's record, with history of heart rate in the 30-40's (in May and November), he is more concerned about patient being too low than having heart rate in the 90-100's. He would rather not have too much medication on board and advised it was ok for heart rate to be in the 90-low-100's. Advised if heart rate elevated and symptomatic, then patient should go to the ER. 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  Patient is out of town in [place] until this Saturday. Dr Caryl Comes would like patient to come in to see him on 12/10/17 at 08:15 am.   Patient verbalized understanding of Dr Olin Pia instructions and is agreeable to appointment date and time. Patient verbalized understanding to call our office if the need for additional advice arises. Patient added to schedule.

## 2017-11-25 NOTE — Telephone Encounter (Signed)
Pt spouse calling stating pt is complaining of rapid heart beat Last Friday this had happened and they called EMS  They are calling for they would like to know if we need to adjust patient's diltiazem   States it is running around 91-100  Please advise

## 2017-11-30 ENCOUNTER — Other Ambulatory Visit: Payer: Self-pay | Admitting: Family Medicine

## 2017-11-30 ENCOUNTER — Telehealth: Payer: Self-pay | Admitting: Internal Medicine

## 2017-11-30 NOTE — Telephone Encounter (Signed)
Copied from [place]. Topic: Quick Communication - See Telephone Encounter >> Nov 30, 2017  9:30 AM Burnis Medin, NT wrote: CRM for notification. See Telephone encounter for: Pt wife is calling in for a prescription refill for HYDROcodone-acetaminophen (NORCO) 10-325 MG tablet. Pt would like a call back when medication is ready for pick up.         11/30/17.

## 2017-11-30 NOTE — Telephone Encounter (Signed)
New message ° °Pt verbalized that he is returning call for the rn  °

## 2017-11-30 NOTE — Telephone Encounter (Signed)
S/w patient and wife. They said patient had received a call. Patient has coumadin check on 12/02/17 and appointment with Dr Caryl Comes on 12/10/17. Patient and wife verbalized understanding of both appointments dates and times.

## 2017-11-30 NOTE — Telephone Encounter (Signed)
LOV 10/27/17 with Dr. Danise Mina / Refill request for Norco /

## 2017-11-30 NOTE — Telephone Encounter (Signed)
Pt  requesting rx hydrocodone apap. Call when ready for pick up. Last printed # 90 on 10/27/17. Last seen 10/27/17 and UDS 09/07/17.

## 2017-12-02 ENCOUNTER — Telehealth: Payer: Self-pay | Admitting: *Deleted

## 2017-12-02 ENCOUNTER — Ambulatory Visit (INDEPENDENT_AMBULATORY_CARE_PROVIDER_SITE_OTHER): Payer: Medicare Other

## 2017-12-02 DIAGNOSIS — Z5181 Encounter for therapeutic drug level monitoring: Secondary | ICD-10-CM | POA: Diagnosis not present

## 2017-12-02 DIAGNOSIS — I4891 Unspecified atrial fibrillation: Secondary | ICD-10-CM

## 2017-12-02 LAB — POCT INR: INR: 2.2

## 2017-12-02 MED ORDER — HYDROCODONE-ACETAMINOPHEN 10-325 MG PO TABS: 1.0000 | ORAL_TABLET | Freq: Three times a day (TID) | ORAL | 0 refills | Status: DC | PRN

## 2017-12-02 NOTE — Telephone Encounter (Signed)
plz notify this was sent electronically 

## 2017-12-02 NOTE — Telephone Encounter (Signed)
-----   Message from Minna Merritts, MD sent at 11/30/2017  6:52 PM EST ----- I certainly think he has tachy-brady One minute atrial fib with rapid rate, Loletha Grayer with rate control, Would probably benefit from pacing Triage, can we set up an apt with Dr. Mickey Farber  ----- Message ----- From: Deboraha Sprang, MD Sent: 11/25/2017   2:15 PM To: Vanessa Ralphs, RN, Valora Corporal, RN, #  tim, Anderson Malta called me about this patient on Thursday 12/19.  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  What you think?   I was wondering whether it might be helpful for me to see him on 1/3 and talk with him about whether backup bradycardia pacing might be of value.

## 2017-12-02 NOTE — Patient Instructions (Signed)
Continue taking 1/2 tablet every day except 1 tablet on Mondays, Wednesdays and Fridays.  Remain consistent with dark green leafy veggies.   Recheck in 5 weeks.

## 2017-12-02 NOTE — Telephone Encounter (Signed)
Patient already scheduled for 12/10/17 to see Dr. Caryl Comes.

## 2017-12-04 ENCOUNTER — Other Ambulatory Visit: Payer: Self-pay | Admitting: Family Medicine

## 2017-12-04 ENCOUNTER — Other Ambulatory Visit: Payer: Self-pay

## 2017-12-08 ENCOUNTER — Other Ambulatory Visit: Payer: Self-pay | Admitting: Family Medicine

## 2017-12-08 DIAGNOSIS — E1165 Type 2 diabetes mellitus with hyperglycemia: Secondary | ICD-10-CM

## 2017-12-08 DIAGNOSIS — D509 Iron deficiency anemia, unspecified: Secondary | ICD-10-CM | POA: Insufficient documentation

## 2017-12-08 DIAGNOSIS — E1140 Type 2 diabetes mellitus with diabetic neuropathy, unspecified: Secondary | ICD-10-CM

## 2017-12-08 DIAGNOSIS — E538 Deficiency of other specified B group vitamins: Secondary | ICD-10-CM

## 2017-12-08 DIAGNOSIS — E782 Mixed hyperlipidemia: Secondary | ICD-10-CM

## 2017-12-08 DIAGNOSIS — R972 Elevated prostate specific antigen [PSA]: Secondary | ICD-10-CM

## 2017-12-08 DIAGNOSIS — R634 Abnormal weight loss: Secondary | ICD-10-CM

## 2017-12-08 DIAGNOSIS — E1142 Type 2 diabetes mellitus with diabetic polyneuropathy: Secondary | ICD-10-CM

## 2017-12-08 DIAGNOSIS — IMO0002 Reserved for concepts with insufficient information to code with codable children: Secondary | ICD-10-CM

## 2017-12-10 ENCOUNTER — Ambulatory Visit (INDEPENDENT_AMBULATORY_CARE_PROVIDER_SITE_OTHER): Payer: Medicare Other | Admitting: Internal Medicine

## 2017-12-10 ENCOUNTER — Ambulatory Visit: Payer: Medicare Other

## 2017-12-10 ENCOUNTER — Encounter: Payer: Self-pay | Admitting: Internal Medicine

## 2017-12-10 VITALS — BP 110/62 | HR 47 | Ht 72.0 in | Wt 225.2 lb

## 2017-12-10 DIAGNOSIS — R0600 Dyspnea, unspecified: Secondary | ICD-10-CM | POA: Diagnosis not present

## 2017-12-10 DIAGNOSIS — R001 Bradycardia, unspecified: Secondary | ICD-10-CM | POA: Diagnosis not present

## 2017-12-10 DIAGNOSIS — I480 Paroxysmal atrial fibrillation: Secondary | ICD-10-CM

## 2017-12-10 DIAGNOSIS — Z79899 Other long term (current) drug therapy: Secondary | ICD-10-CM | POA: Diagnosis not present

## 2017-12-10 NOTE — Patient Instructions (Addendum)
Medication Instructions: -  Your physician has recommended you make the following change in your medication:  1) STOP ditiazem . Labwork: - Your physician recommends that you have lab work today: CBC/ ferritin/ TIBC  Procedures/Testing: - A chest x-ray takes a picture of the organs and structures inside the chest, including the heart, lungs, and blood vessels. This test can show several things, including, whether the heart is enlarges; whether fluid is building up in the lungs; and whether pacemaker / defibrillator leads are still in place.  Follow-Up: - as needed with Dr. Caryl Comes.  Any Additional Special Instructions Will Be Listed Below (If Applicable).     If you need a refill on your cardiac medications before your next appointment, please call your pharmacy.

## 2017-12-10 NOTE — Progress Notes (Signed)
ELECTROPHYSIOLOGY CONSULT NOTE  Patient ID: [name], MRN: [id number], DOB/AGE: 1946/04/09 72 y.o. Admit date: (Not on file) Date of Consult: 12/10/2017  Primary Physician: Ria Bush, MD Primary Cardiologist: Deidre Ala     [name] is a 72 y.o. male who is being seen today for the evaluation of AFib    HPI [name] is a 72 y.o. male  Seen with tachybradycardia syndrome  He has a history of atrial fibrillation that goes back about 3 years.  He has been on amiodarone.  He has noted that over the last couple of years things have gotten worse with overall loss of energy and vigor.   He  has a history of bradycardia.  Heart rates have been in the 30s and 40s.  When his heart rates are in the 50s-60s he feels okay in the 40s he feels much less good.  He has episodes of atrial fibrillation.  Manifested by heart rates in the 90s.  For this he takes extra amiodarone and diltiazem.  These have been increasingly frequent as down titration of his amiodarone was sought because of bradycardia.  He has increased the amiodarone dose again.   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    He has modest dyspnea on exertion he denies chest pain.  No hemoptysis.  No change in stool color.   Thromboembolic risk factors ( age -96 , HTN-1, DM-1, Vasc disease -1) for a CHADSVASc Score of 4   Past Medical History:  Diagnosis Date  . AAA (abdominal aortic aneurysm) ([place]) 2013   s/p stent graft repair now with supra/pararenal aneurysm 3.5cm, [facility] to [facility] Sammuel Hines [facility] for endovascular repair (12/2013)  . Abnormal drug screen 06/2015   see problem list  . Cervical neck pain with evidence of disc disease 07/2011   MRI - disk bulging and foraminal stenosis, advanced at C4/5, 5/6; rec pain management for ESI by Dr. Mack Guise   . CHF (congestive heart failure) ([place])   . COPD  (chronic obstructive pulmonary disease) (HCC)    mod-severe COPD/emphysema.  PFTs 12/2010.  He still smokes 1 ppd.  . Depression   . ED (erectile dysfunction) 02/2012   penile injections - failed viagra, poor arterial flow ([place])  . Fatty liver   . GERD (gastroesophageal reflux disease)   . Hyperlipidemia    myalgias with simvastatin and atorvastatin  . Leg cramps    idiopathic severe  . Muscle spasm    chronic  . Neuralgia    pain in hands. L>R from accident  . Obesity   . OSA (obstructive sleep apnea) 03/2012   AHI 18.6, desat to 74%, severe snoring, consider ENT eval  . Osteoarthritis   . Paroxysmal atrial fibrillation (HCC)    on coumadin only.  . Peripheral autonomic neuropathy due to diabetes mellitus ([place])   . Right shoulder injury 05/2012   after fall out of chair, s/p injection, rec conservative management with PT Noemi Chapel)  . Smoker    1ppd  . T2DM (type 2 diabetes mellitus) ([place])       Surgical History:  Past Surgical History:  Procedure Laterality Date  . CHOLECYSTECTOMY  2001  . CTA abd  09/2011   6.1cm AAA, bilateral ing  hernias, R with bladder wall, promient prostate calcifications  . ENDOVASCULAR STENT INSERTION  11/11/2011   Procedure: ENDOVASCULAR STENT GRAFT INSERTION;  Surgeon: Angelia Mould, MD;  Location: [facility];  Service: Vascular;  Laterality: N/A;  aorta bi iliac  . KNEE SURGERY     L side cartilage taken out  . PFTs  12/2010   mod-severe obstruction, ?bronchodilator response  . TONSILLECTOMY       Home Meds: Prior to Admission medications   Medication Sig Start Date End Date Taking? Authorizing Provider  amiodarone (PACERONE) 200 MG tablet Take 0.5 tablets (100 mg total) by mouth daily. Patient taking differently: Take 200 mg by mouth daily.  11/11/17  Yes Gollan, Kathlene November, MD  Coenzyme Q10 100 MG capsule Take 1 capsule (100 mg total) by mouth daily. 03/25/11  Yes Gollan, Kathlene November, MD  fish oil-omega-3 fatty acids 1000 MG capsule Take  2 g by mouth daily.    Yes [provider]  omeprazole (PRILOSEC) 40 MG capsule TAKE 1 CAPSULE BY MOUTH EVERY DAY 12/29/16  Yes Ria Bush, MD  potassium chloride (K-DUR,KLOR-CON) 10 MEQ tablet Take 2 tablets (20 mEq total) 2 (two) times daily by mouth. 10/26/17  Yes Gollan, Kathlene November, MD  PROAIR HFA 108 (90 Base) MCG/ACT inhaler INHALE 2 PUFFS BY MOUTH EVERY 6 HOURS AS NEEDED FOR WHEEZING OR SHORTNESS OF BREATH 04/13/17  Yes Ria Bush, MD  warfarin (COUMADIN) 5 MG tablet TAKE AS DIRECTED BY COUMADIN CLINIC 11/16/17  Yes Minna Merritts, MD    Allergies:  Allergies  Allergen Reactions  . Varenicline Tartrate Other (See Comments)    REACTION: hallucinations, but on retrial did well  . Wellbutrin [Bupropion Hcl] Other (See Comments)    Hallucinations  . Zocor [Simvastatin] Other (See Comments)    Muscle pain    Social History   Socioeconomic History  . Marital status: Married    Spouse name: Juliann Pulse  . Number of children: 3  . Years of education: 12th gr  . Highest education level: Not on file  Social Needs  . Financial resource strain: Not on file  . Food insecurity - worry: Not on file  . Food insecurity - inability: Not on file  . Transportation needs - medical: Not on file  . Transportation needs - non-medical: Not on file  Occupational History  . Occupation: truck Education administrator: TRANSPORTATION    Comment: for years  . Occupation: Corporate treasurer: [employer]    Comment: runs this  Tobacco Use  . Smoking status: Current Every Day Smoker    Packs/day: 1.50    Years: 52.00    Pack years: 78.00    Types: Cigarettes  . Smokeless tobacco: Never Used  Substance and Sexual Activity  . Alcohol use: Yes    Alcohol/week: 0.0 oz    Comment: occasional, 1 glass margarita or glass of wine once every 6 months; occasional beer with tomato juice  . Drug use: No  . Sexual activity: Not on file  Other Topics Concern  . Not on file  Social  History Narrative   Caffeine: 1 cup coffee, 1/2 gallon unsweet tea, 2 soda/day   Lives with wife and 1 dog, 6 cats outside.   Occ: Long distance truck driver   Started seeking care when received medicare.   H/o noncompliance     Family History  Problem Relation Age of Onset  . Heart disease Father   . Leukemia  Father   . Coronary artery disease Father   . Melanoma Sister   . Arthritis Mother   . Diabetes Neg Hx   . Stroke Neg Hx      ROS:  Please see the history of present illness.     All other systems reviewed and negative.    Physical Exam:  Blood pressure 110/62, pulse (!) 47, height 6' (1.829 m), weight 225 lb 4 oz (102.2 kg). General: Well developed, well nourished male in no acute distress. Head: Normocephalic, atraumatic, sclera non-icteric, no xanthomas, nares are without discharge. EENT: normal  Lymph Nodes:  none Neck: Negative for carotid bruits. JVD not elevated. Back:without scoliosis kyphosis  Lungs: Markedly diminished breath sounds on the left lung and expiratory wheeze Heart: Slow and distant heart sounds with normal S1-S2 without murmur . No rubs, or gallops appreciated. Abdomen: Soft, non-tender, non-distended with normoactive bowel sounds. No hepatomegaly. No rebound/guarding. No obvious abdominal masses. Msk:  Strength and tone appear normal for age. Extremities: No clubbing or cyanosis. No edema.  Distal pedal pulses are 2+ and equal bilaterally. Skin: Warm and Dry Neuro: Alert and oriented X 3. CN III-XII intact Grossly normal sensory and motor function . Psych:  Responds to questions appropriately with a normal affect.      Labs: Cardiac Enzymes No results for input(s): CKTOTAL, CKMB, TROPONINI in the last 72 hours. CBC Lab Results  Component Value Date   WBC 8.4 10/09/2017   HGB 10.7 (L) 10/09/2017   HCT 35.6 (L) 10/09/2017   MCV 69.9 Repeated and verified X2. (L) 10/09/2017   PLT 456.0 (H) 10/09/2017   PROTIME: No results for input(s):  LABPROT, INR in the last 72 hours. Chemistry No results for input(s): NA, K, CL, CO2, BUN, CREATININE, CALCIUM, PROT, BILITOT, ALKPHOS, ALT, AST, GLUCOSE in the last 168 hours.  Invalid input(s): LABALBU Lipids Lab Results  Component Value Date   CHOL 205 (H) 06/08/2015   HDL 33.30 (L) 06/08/2015   LDLCALC 114 (H) 06/23/2014   TRIG 205.0 (H) 06/08/2015   BNP No results found for: PROBNP Thyroid Function Tests: No results for input(s): TSH, T4TOTAL, T3FREE, THYROIDAB in the last 72 hours.  Invalid input(s): FREET3 Miscellaneous No results found for: DDIMER  Radiology/Studies:  No results found.  EKG: Sinus at 47 Intervals 18/10/51  ECG October 12, 2017 sinus rhythm at 45  ECG Apr 24, 2017 sinus rhythm at 38  ECG December 19, 2016 atrial fibrillation 117   Assessment and Plan:   Tachybradycardia syndrome  Abnormal lung examination  Anemia  AAA repair   Patient has symptomatic atrial fibrillation with a controlled rate.  His overall sense of lassitude over the last couple years coincides duration of the amiodarone and I suspect in large part related to chronotropic incompetence.  He has significant sinus bradycardia as outlined above.  He is averse to pacing.  He has had friends and family who are in the wake of pacing worsening of their medical conditions.  We discussed this at some length and at the end he was at least open to be thinking about it.  He is also averse to consideration of catheter ablation as an alternative; he had experienced following anesthesia for prolonged awakening.  He appreciates that the symptoms are related to atrial fibrillation even though was not fast as well as some somatic sinus bradycardia.  Most recent blood work shows iron deficiency anemia.  We will repeat the blood work today.    Chest examination is  markedly abnormal decreased breath sounds on the left.  We will get a chest x-ray.  With his smoking history, I am  concerned...      Virl Axe

## 2017-12-13 NOTE — Progress Notes (Signed)
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 50000 units capsule, Take 1 capsule (50,000 Units total) by mouth 2 (two) times a week. X 6 weeks., Disp: 12 capsule, Rfl: 0 .  Magnesium Oxide 500 MG CAPS, Take 1 capsule (500 mg total) by mouth 2 (two) times daily at 8 am and 10 pm., Disp: 60 capsule, Rfl: 2 .  oxyCODONE (OXY IR/ROXICODONE) 5 MG immediate release tablet, Take 1 tablet (5 mg total) by mouth every 6 (six) hours as needed for severe pain., Disp: 120 tablet, Rfl: 0  ROS  Constitutional: Denies any fever or chills Gastrointestinal: No reported hemesis, hematochezia, vomiting, or acute GI distress Musculoskeletal: Denies any acute onset joint swelling, redness, loss of ROM, or weakness Neurological: No reported episodes of acute onset apraxia, aphasia, dysarthria, agnosia, amnesia, paralysis, loss of coordination, or loss of  consciousness  Allergies  Mr. Cuccaro is allergic to varenicline tartrate; wellbutrin [bupropion hcl]; and zocor [simvastatin].  [place]  Drug: Mr. Flythe  reports that he does not use drugs. Alcohol:  reports that he drinks about 4.2 oz of alcohol per week. Tobacco:  reports that he has been smoking cigarettes.  He has a 78.00 pack-year smoking history. he has never used smokeless tobacco. Medical:  has a past medical history of AAA (abdominal aortic aneurysm) ([place]) (2013), Abnormal drug screen (06/2015), Cervical neck pain with evidence of disc disease (07/2011), CHF (congestive heart failure) ([place]), COPD (chronic obstructive pulmonary disease) ([place]), Depression, ED (erectile dysfunction) (02/2012), Fatty liver, GERD (gastroesophageal reflux disease), Hyperlipidemia, Leg cramps, Muscle spasm, Neuralgia, Obesity, OSA (obstructive sleep apnea) (03/2012), Osteoarthritis, Paroxysmal atrial fibrillation ([place]), Peripheral autonomic neuropathy due to diabetes mellitus ([place]), Right shoulder injury (05/2012), Smoker, and T2DM (type 2 diabetes mellitus) ([place]). Surgical: Mr. Kissick  has a past surgical history that includes Tonsillectomy; Cholecystectomy (2001); Knee surgery; PFTs (12/2010); CTA abd (09/2011); and Endovascular stent insertion (11/11/2011). Family: family history includes Arthritis in his mother; Coronary artery disease in his father; Heart disease in his father; Leukemia in his father; Melanoma in his sister.  Constitutional Exam  General appearance: Well nourished, well developed, and well hydrated. In no apparent acute distress Vitals:   12/16/17 0858  BP: 97/61  Pulse: 67  Resp: 16  Temp: 97.8 F (36.6 C)  TempSrc: Oral  SpO2: 95%  Weight: 220 lb (99.8 kg)  Height: '5\' 9"'$  (1.753 m)   BMI Assessment: Estimated body mass index is 32.49 kg/m as calculated from the following:   Height as of this encounter: '5\' 9"'$  (1.753 m).   Weight as of this encounter: 220 lb (99.8 kg).  BMI interpretation  table: BMI level Category Range association with higher incidence of chronic pain  <18 kg/m2 Underweight   18.5-24.9 kg/m2 Ideal body weight   25-29.9 kg/m2 Overweight Increased incidence by 20%  30-34.9 kg/m2 Obese (Class I) Increased incidence by 68%  35-39.9 kg/m2 Severe obesity (Class II) Increased incidence by 136%  >40 kg/m2 Extreme obesity (Class III) Increased incidence by 254%   BMI Readings from Last 4 Encounters:  12/16/17 32.49 kg/m  12/14/17 32.15 kg/m  12/10/17 30.55 kg/m  10/27/17 27.80 kg/m   Wt Readings from Last 4 Encounters:  12/16/17 220 lb (99.8 kg)  12/14/17 230 lb 8 oz (104.6 kg)  12/10/17 225 lb 4 oz (102.2 kg)  10/27/17 205 lb (93 kg)  Psych/Mental status: Alert, oriented x 3 (person, place, & time)  Eyes: PERLA Respiratory: No evidence of acute respiratory distress  Cervical Spine Area Exam  Skin & Axial Inspection: No masses, redness, edema, swelling, or associated skin lesions Alignment: Symmetrical Functional ROM: Unrestricted ROM      Stability: No instability detected Muscle Tone/Strength: Functionally intact. No obvious neuro-muscular anomalies detected. Sensory (Neurological): Unimpaired Palpation: No palpable anomalies              Upper Extremity (UE) Exam    Side: Right upper extremity  Side: Left upper extremity  Skin & Extremity Inspection: Skin color, temperature, and hair growth are WNL. No peripheral edema or cyanosis. No masses, redness, swelling, asymmetry, or associated skin lesions. No contractures.  Skin & Extremity Inspection: Skin color, temperature, and hair growth are WNL. No peripheral edema or cyanosis. No masses, redness, swelling, asymmetry, or associated skin lesions. No contractures.  Functional ROM: Decreased ROM for shoulder  Functional ROM: Unrestricted ROM          Muscle Tone/Strength: Functionally intact. No obvious neuro-muscular anomalies detected.  Muscle Tone/Strength: Functionally intact. No obvious  neuro-muscular anomalies detected.  Sensory (Neurological): Unimpaired          Sensory (Neurological): Unimpaired          Palpation: Trigger point detected over the tendon of the biceps muscle on the right side pressure over this area would refer pain to the forearm.              Palpation: No palpable anomalies              Specialized Test(s): Deferred         Specialized Test(s): Deferred          Thoracic Spine Area Exam  Skin & Axial Inspection: No masses, redness, or swelling Alignment: Symmetrical Functional ROM: Unrestricted ROM Stability: No instability detected Muscle Tone/Strength: Functionally intact. No obvious neuro-muscular anomalies detected. Sensory (Neurological): Unimpaired Muscle strength & Tone: No palpable anomalies  Lumbar Spine Area Exam  Skin & Axial Inspection: No masses, redness, or swelling Alignment: Symmetrical Functional ROM: Unrestricted ROM      Stability: No instability detected Muscle Tone/Strength: Functionally intact. No obvious neuro-muscular anomalies detected. Sensory (Neurological): Unimpaired Palpation: No palpable anomalies       Provocative Tests: Lumbar Hyperextension and rotation test: evaluation deferred today       Lumbar Lateral bending test: evaluation deferred today       Patrick's Maneuver: evaluation deferred today                    Gait & Posture Assessment  Ambulation: Patient ambulates using a cane Gait: Limited. Using assistive device to ambulate Posture: Antalgic   Lower Extremity Exam    Side: Right lower extremity  Side: Left lower extremity  Skin & Extremity Inspection: Skin color, temperature, and hair growth are WNL. No peripheral edema or cyanosis. No masses, redness, swelling, asymmetry, or associated skin lesions. No contractures.  Skin & Extremity Inspection: Skin color, temperature, and hair growth are WNL. No peripheral edema or cyanosis. No masses, redness, swelling, asymmetry, or associated skin lesions. No  contractures.  Functional ROM: Unrestricted ROM          Functional ROM: Unrestricted ROM          Muscle Tone/Strength: Functionally intact. No obvious neuro-muscular anomalies detected.  Muscle Tone/Strength: Functionally intact. No obvious neuro-muscular anomalies detected.  Sensory (Neurological): Unimpaired  Sensory (Neurological): Unimpaired  Palpation: No palpable anomalies  Palpation: No palpable anomalies  Assessment & Plan  Primary Diagnosis & Pertinent Problem List: The primary encounter diagnosis was Chronic shoulder pain (Primary Area of Pain) (Bilateral) (R>L). Diagnoses of Biceps tendinitis of right shoulder, Osteoarthritis of shoulder (Right), Osteoarthritis of right AC (acromioclavicular) joint, Osteoarthritis, Cervical spondylosis, Cervical spinal stenosis (C5-6 & C6-7), Problems influencing health status, Vitamin B12 deficiency, Vitamin D insufficiency, and Chronic pain syndrome were also pertinent to this visit.  Visit Diagnosis: 1. Chronic shoulder pain (Primary Area of Pain) (Bilateral) (R>L)   2. Biceps tendinitis of right shoulder   3. Osteoarthritis of shoulder (Right)   4. Osteoarthritis of right AC (acromioclavicular) joint   5. Osteoarthritis   6. Cervical spondylosis   7. Cervical spinal stenosis (C5-6 & C6-7)   8. Problems influencing health status   9. Vitamin B12 deficiency   10. Vitamin D insufficiency   11. Chronic pain syndrome    Problems updated and reviewed during this visit: Problem  Biceps Tendinitis of Right Shoulder  Osteoarthritis of AC (acromioclavicular) joint (Right)  Osteoarthritis of shoulder (Right)  Vitamin B12 Deficiency  Vitamin D Insufficiency    Procedure:  Anesthesia, Analgesia, Anxiolysis:  Type: Ligament/Tendon sheath (24268) Injection. Purpose: Diagnostic Laterality: Right-side Position: Sitting Target Area: Biceps tendon Region: Shoulder Approach: Percutanous  Type: Local Anesthesia Local Anesthetic: Lidocaine  1% Route: Infiltration ([place]/IM) IV Access: Declined Sedation: Declined  Indication(s): Analgesia           Indications: 1. Chronic shoulder pain (Primary Area of Pain) (Bilateral) (R>L)   2. Biceps tendinitis of right shoulder   3. Osteoarthritis of shoulder (Right)   4. Osteoarthritis of right AC (acromioclavicular) joint    Pain Score: Pre-procedure: 8 /10 Post-procedure: 8 /10  Pre-op Assessment:  Mr. Badal is a 72 y.o. (year old), male patient, seen today for interventional treatment. He  has a past surgical history that includes Tonsillectomy; Cholecystectomy (2001); Knee surgery; PFTs (12/2010); CTA abd (09/2011); and Endovascular stent insertion (11/11/2011). Mr. Asmar has a current medication list which includes the following prescription(s): amiodarone, coenzyme q10, diltiazem, fish oil-omega-3 fatty acids, gabapentin, proair hfa, warfarin, calcium plus d3 absorbable, cholecalciferol, cyanocobalamin, ergocalciferol, magnesium oxide, and oxycodone. His primarily concern today is the Shoulder Pain (right)  Initial Vital Signs: Blood pressure 97/61, pulse 67, temperature 97.8 F (36.6 C), temperature source Oral, resp. rate 16, height '5\' 9"'$  (1.753 m), weight 220 lb (99.8 kg), SpO2 95 %. BMI: Estimated body mass index is 32.49 kg/m as calculated from the following:   Height as of this encounter: '5\' 9"'$  (1.753 m).   Weight as of this encounter: 220 lb (99.8 kg).  Risk Assessment: Allergies: Reviewed. He is allergic to varenicline tartrate; wellbutrin [bupropion hcl]; and zocor [simvastatin].  Allergy Precautions: None required Coagulopathies: Reviewed. None identified.  Blood-thinner therapy: None at this time Active Infection(s): Reviewed. None identified. Mr. Gornick is afebrile  Site Confirmation: Mr. Dunlow was asked to confirm the procedure and laterality before marking the site Procedure checklist: Completed Consent: Before the procedure and under the influence of no  sedative(s), amnesic(s), or anxiolytics, the patient was informed of the treatment options, risks and possible complications. To fulfill our ethical and legal obligations, as recommended by the American Medical Association's Code of Ethics, I have informed the patient of my clinical impression; the nature and purpose of the treatment or procedure; the risks, benefits, and possible complications of the intervention; the alternatives, including doing nothing; the risk(s) and benefit(s) of the alternative treatment(s) or procedure(s); and the risk(s) and benefit(s) of  doing nothing. The patient was provided information about the general risks and possible complications associated with the procedure. These may include, but are not limited to: failure to achieve desired goals, infection, bleeding, organ or nerve damage, allergic reactions, paralysis, and death. In addition, the patient was informed of those risks and complications associated to the procedure, such as failure to decrease pain; infection; bleeding; organ or nerve damage with subsequent damage to sensory, motor, and/or autonomic systems, resulting in permanent pain, numbness, and/or weakness of one or several areas of the body; allergic reactions; (i.e.: anaphylactic reaction); and/or death. Furthermore, the patient was informed of those risks and complications associated with the medications. These include, but are not limited to: allergic reactions (i.e.: anaphylactic or anaphylactoid reaction(s)); adrenal axis suppression; blood sugar elevation that in diabetics may result in ketoacidosis or comma; water retention that in patients with history of congestive heart failure may result in shortness of breath, pulmonary edema, and decompensation with resultant heart failure; weight gain; swelling or edema; medication-induced neural toxicity; particulate matter embolism and blood vessel occlusion with resultant organ, and/or nervous system infarction;  and/or aseptic necrosis of one or more joints. Finally, the patient was informed that Medicine is not an exact science; therefore, there is also the possibility of unforeseen or unpredictable risks and/or possible complications that may result in a catastrophic outcome. The patient indicated having understood very clearly. We have given the patient no guarantees and we have made no promises. Enough time was given to the patient to ask questions, all of which were answered to the patient's satisfaction. Mr. Deroo has indicated that he wanted to continue with the procedure. Attestation: I, the ordering provider, attest that I have discussed with the patient the benefits, risks, side-effects, alternatives, likelihood of achieving goals, and potential problems during recovery for the procedure that I have provided informed consent. Date: 12/16/2017; Time: 9:49 AM  Pre-Procedure Preparation:  Monitoring: As per clinic protocol. Respiration, ETCO2, SpO2, BP, heart rate and rhythm monitor placed and checked for adequate function Safety Precautions: Patient was assessed for positional comfort and pressure points before starting the procedure. Time-out: I initiated and conducted the "Time-out" before starting the procedure, as per protocol. The patient was asked to participate by confirming the accuracy of the "Time Out" information. Verification of the correct person, site, and procedure were performed and confirmed by me, the nursing staff, and the patient. "Time-out" conducted as per Joint Commission's Universal Protocol (UP.01.01.01). "Time-out" Date & Time: 12/16/2017; 1000 hrs.  Description of Procedure Process:   Area Prepped: Right shoulder area Prepping solution: ChloraPrep (2% chlorhexidine gluconate and 70% isopropyl alcohol) Safety Precautions: Aspiration looking for blood return was conducted prior to all injections. At no point did we inject any substances, as a needle was being advanced. No attempts  were made at seeking any paresthesias. Safe injection practices and needle disposal techniques used. Medications properly checked for expiration dates. SDV (single dose vial) medications used. Description of the Procedure: Protocol guidelines were followed. The patient was placed in position. The target area was identified and prepped in the usual manner. Skin & deeper tissues infiltrated with local anesthetic. Appropriate time provided for local anesthetics to take effect. The procedure needle was slowly advanced to target area. Proper needle placement secured. Negative aspiration confirmed. Solution injected in intermittent fashion, asking for systemic symptoms every 0.5cc. Needle(s) removed and area cleaned, making sure to leave some prepping solution back to take advantage of its long term bactericidal properties. Vitals:  12/16/17 0858  BP: 97/61  Pulse: 67  Resp: 16  Temp: 97.8 F (36.6 C)  TempSrc: Oral  SpO2: 95%  Weight: 220 lb (99.8 kg)  Height: '5\' 9"'$  (1.753 m)    Start Time: 1001 hrs. End Time: 1001 hrs. Materials:  Needle(s) Type: Regular needle Gauge: 25G Length: 1.5-in Medication(s): We administered triamcinolone acetonide, lidocaine, and ropivacaine (PF) 2 mg/mL (0.2%). Please see chart orders for dosing details.  Imaging Guidance:  Type of Imaging Technique: None used  Post-operative Assessment:  EBL: None Complications: No immediate post-treatment complications observed by team, or reported by patient. Note: The patient tolerated the entire procedure well. A repeat set of vitals were taken after the procedure and the patient was kept under observation following institutional policy, for this type of procedure. Post-procedural neurological assessment was performed, showing return to baseline, prior to discharge. The patient was provided with post-procedure discharge instructions, including a section on how to identify potential problems. Should any problems arise  concerning this procedure, the patient was given instructions to immediately contact us, at any time, without hesitation. In any case, we plan to contact the patient by telephone for a follow-up status report regarding this interventional procedure. Comments:  No additional relevant information.  Plan of Care  Pharmacotherapy (Medications Ordered): Meds ordered this encounter  Medications  . triamcinolone acetonide (KENALOG-40) injection 40 mg  . lidocaine (XYLOCAINE) 2 % (with pres) injection 400 mg  . ropivacaine (PF) 2 mg/mL (0.2%) (NAROPIN) injection 4 mL  . Calcium Carb-Cholecalciferol (CALCIUM PLUS D3 ABSORBABLE) [phone] MG-UNIT CAPS    Sig: Take 1 capsule by mouth daily with breakfast.    Dispense:  90 capsule    Refill:  0    Do not place medication on "Automatic Refill". Fill one day early if pharmacy is closed on scheduled refill date.  . ergocalciferol (VITAMIN D2) 50000 units capsule    Sig: Take 1 capsule (50,000 Units total) by mouth 2 (two) times a week. X 6 weeks.    Dispense:  12 capsule    Refill:  0    Do not add this medication to the electronic "Automatic Refill" notification system. Patient may have prescription filled one day early if pharmacy is closed on scheduled refill date.  . Cholecalciferol 5000 units capsule    Sig: Take 1 capsule (5,000 Units total) by mouth daily.    Dispense:  90 capsule    Refill:  0    Do not place medication on "Automatic Refill". Fill one day early if pharmacy is closed on scheduled refill date.  . Magnesium Oxide 500 MG CAPS    Sig: Take 1 capsule (500 mg total) by mouth 2 (two) times daily at 8 am and 10 pm.    Dispense:  60 capsule    Refill:  2    Do not place medication on "Automatic Refill". Fill one day early if pharmacy is closed on scheduled refill date.  . cyanocobalamin ([employer] VITAMIN B12) 2000 MCG tablet    Sig: Take 1 tablet (2,000 mcg total) by mouth daily.    Dispense:  90 tablet    Refill:  0    Do not place  medication on "Automatic Refill". Fill one day early if pharmacy is closed on scheduled refill date.  Marland Kitchen oxyCODONE (OXY IR/ROXICODONE) 5 MG immediate release tablet    Sig: Take 1 tablet (5 mg total) by mouth every 6 (six) hours as needed for severe pain.    Dispense:  120  tablet    Refill:  0    Do not place this medication, or any other prescription from our practice, on "Automatic Refill". Patient may have prescription filled one day early if pharmacy is closed on scheduled refill date. Do not fill until: 12/16/17 To last until: 01/15/18    Procedure Orders     Injection tendon or ligament Lab Orders  No laboratory test(s) ordered today    Imaging Orders     MR SHOULDER RIGHT WO CONTRAST Referral Orders  No referral(s) requested today    Pharmacological management options:  Opioid Analgesics: We'll take over management today. See above orders Membrane stabilizer: We have discussed the possibility of optimizing this mode of therapy, if tolerated Muscle relaxant: We have discussed the possibility of a trial NSAID: We have discussed the possibility of a trial Other analgesic(s): To be determined at a later time   Interventional management options: Planned, scheduled, and/or pending:    Diagnostic right biceps tendon trigger point injection (today). Today we will order an MRI of his right shoulder.   Considering:   Diagnostic right  intra-articular shoulder injection Diagnostic right suprascapular nerve block Possible right suprascapular RFA Diagnostic cervical epidural steroid injection Diagnostic cervical facet block Possible cervical facet RFA   PRN Procedures:   None at this time   Provider-requested follow-up: Return for post-procedure eval (2 wks).  Future Appointments  Date Time Provider [place]  12/21/2017  2:45 PM Ladene Artist, MD [facility]  01/04/2018  8:15 AM Milinda Pointer, MD [facility] None  01/06/2018 10:00 AM [facility]  01/06/2018  4:00 PM Ria Bush, MD [facility]    Primary Care Physician: Ria Bush, MD Location: [facility] Note by: Gaspar Cola, MD Date: 12/16/2017; Time: 11:55 AM

## 2017-12-14 ENCOUNTER — Ambulatory Visit (INDEPENDENT_AMBULATORY_CARE_PROVIDER_SITE_OTHER): Payer: Medicare Other

## 2017-12-14 VITALS — BP 112/58 | HR 54 | Temp 97.6°F | Ht 71.0 in | Wt 230.5 lb

## 2017-12-14 DIAGNOSIS — E1165 Type 2 diabetes mellitus with hyperglycemia: Secondary | ICD-10-CM

## 2017-12-14 DIAGNOSIS — R7989 Other specified abnormal findings of blood chemistry: Secondary | ICD-10-CM | POA: Diagnosis not present

## 2017-12-14 DIAGNOSIS — Z0000 Encounter for general adult medical examination without abnormal findings: Secondary | ICD-10-CM | POA: Diagnosis not present

## 2017-12-14 DIAGNOSIS — M150 Primary generalized (osteo)arthritis: Secondary | ICD-10-CM

## 2017-12-14 DIAGNOSIS — E1140 Type 2 diabetes mellitus with diabetic neuropathy, unspecified: Secondary | ICD-10-CM

## 2017-12-14 DIAGNOSIS — M25511 Pain in right shoulder: Secondary | ICD-10-CM | POA: Insufficient documentation

## 2017-12-14 DIAGNOSIS — R972 Elevated prostate specific antigen [PSA]: Secondary | ICD-10-CM

## 2017-12-14 DIAGNOSIS — E538 Deficiency of other specified B group vitamins: Secondary | ICD-10-CM | POA: Diagnosis not present

## 2017-12-14 DIAGNOSIS — D509 Iron deficiency anemia, unspecified: Secondary | ICD-10-CM | POA: Diagnosis not present

## 2017-12-14 DIAGNOSIS — E782 Mixed hyperlipidemia: Secondary | ICD-10-CM | POA: Diagnosis not present

## 2017-12-14 DIAGNOSIS — M25512 Pain in left shoulder: Secondary | ICD-10-CM | POA: Insufficient documentation

## 2017-12-14 DIAGNOSIS — M159 Polyosteoarthritis, unspecified: Secondary | ICD-10-CM | POA: Insufficient documentation

## 2017-12-14 DIAGNOSIS — IMO0002 Reserved for concepts with insufficient information to code with codable children: Secondary | ICD-10-CM

## 2017-12-14 LAB — LIPID PANEL
CHOL/HDL RATIO: 4
Cholesterol: 136 mg/dL (ref 0–200)
HDL: 31.3 mg/dL — AB (ref >39.00)
LDL Cholesterol: 79 mg/dL (ref 0–99)
NONHDL: 104.77
Triglycerides: 128 mg/dL (ref 0.0–149.0)
VLDL: 25.6 mg/dL (ref 0.0–40.0)

## 2017-12-14 LAB — VITAMIN B12: Vitamin B-12: 189 pg/mL — ABNORMAL LOW (ref 211–911)

## 2017-12-14 LAB — TSH: TSH: 1.93 u[IU]/mL (ref 0.35–4.50)

## 2017-12-14 LAB — CBC WITH DIFFERENTIAL/PLATELET
BASOS PCT: 0.8 % (ref 0.0–3.0)
Basophils Absolute: 0.1 10*3/uL (ref 0.0–0.1)
EOS PCT: 4.5 % (ref 0.0–5.0)
Eosinophils Absolute: 0.5 10*3/uL (ref 0.0–0.7)
HCT: 28.9 % — ABNORMAL LOW (ref 39.0–52.0)
Hemoglobin: 8.5 g/dL — ABNORMAL LOW (ref 13.0–17.0)
LYMPHS ABS: 2.4 10*3/uL (ref 0.7–4.0)
Lymphocytes Relative: 21.4 % (ref 12.0–46.0)
MCHC: 29.5 g/dL — AB (ref 30.0–36.0)
MCV: 69.4 fl — AB (ref 78.0–100.0)
MONOS PCT: 5.8 % (ref 3.0–12.0)
Monocytes Absolute: 0.6 10*3/uL (ref 0.1–1.0)
NEUTROS ABS: 7.4 10*3/uL (ref 1.4–7.7)
NEUTROS PCT: 67.5 % (ref 43.0–77.0)
PLATELETS: 310 10*3/uL (ref 150.0–400.0)
RBC: 4.17 Mil/uL — AB (ref 4.22–5.81)
RDW: 18.9 % — AB (ref 11.5–15.5)
WBC: 11 10*3/uL — ABNORMAL HIGH (ref 4.0–10.5)

## 2017-12-14 LAB — MICROALBUMIN / CREATININE URINE RATIO
Creatinine,U: 44.8 mg/dL
Microalb Creat Ratio: 1.6 mg/g (ref 0.0–30.0)
Microalb, Ur: <0.7 mg/dL (ref 0.0–1.9)

## 2017-12-14 LAB — IBC PANEL
IRON: 12 ug/dL — AB (ref 42–165)
Saturation Ratios: 2.6 % — ABNORMAL LOW (ref 20.0–50.0)
Transferrin: 330 mg/dL (ref 212.0–360.0)

## 2017-12-14 LAB — FERRITIN: Ferritin: 10.4 ng/mL — ABNORMAL LOW (ref 22.0–322.0)

## 2017-12-14 NOTE — Progress Notes (Signed)
PCP notes:   Health maintenance:  Foot exam - PCP please address at next appt Eye exam - per pt, exam on 12/11/17 Microalbumin - completed  Abnormal screenings:   Depression score: 2 Mini-Cog score: 19/20 Hearing - failed  Hearing Screening   125Hz  250Hz  500Hz  1000Hz  2000Hz  3000Hz  4000Hz  6000Hz  8000Hz   Right ear:   40 40 40  0    Left ear:   40 40 0  0     Patient concerns:   None  Nurse concerns:  None  Next PCP appt:   12/17/17 @ 1130

## 2017-12-14 NOTE — Progress Notes (Signed)
Subjective:   [name] is a 72 y.o. male who presents for Medicare Annual/Subsequent preventive examination.  Review of Systems:  N/A Cardiac Risk Factors include: advanced age (>69men, >65 women);male gender;dyslipidemia;smoking/ tobacco exposure;obesity (BMI >30kg/m2);diabetes mellitus     Objective:    Vitals: BP (!) 112/58 (BP Location: Left Arm, Patient Position: Sitting, Cuff Size: Normal)   Pulse (!) 54   Temp 97.6 F (36.4 C) (Oral)   Ht 5\' 11"  (1.803 m) Comment: no shoes  Wt 230 lb 8 oz (104.6 kg)   SpO2 91%   BMI 32.15 kg/m   Body mass index is 32.15 kg/m.  Advanced Directives 12/14/2017 09/07/2017 01/21/2017 12/09/2016 01/02/2016 11/11/2011  Does Patient Have a Medical Advance Directive? No No No No No Patient would not like information  Would patient like information on creating a medical advance directive? Yes (MAU/Ambulatory/Procedural Areas - Information given) No - Patient declined No - Patient declined - - -  Pre-existing out of facility DNR order (yellow form or pink MOST form) - - - - - No    Tobacco Social History   Tobacco Use  Smoking Status Current Every Day Smoker  . Packs/day: 1.50  . Years: 52.00  . Pack years: 78.00  . Types: Cigarettes  Smokeless Tobacco Never Used     Ready to quit: No Counseling given: No   Clinical Intake:  Pre-visit preparation completed: Yes  Pain : 0-10 Pain Score: 9  Pain Location: Neck(right shoulder) Pain Orientation: Right Pain Descriptors / Indicators: Constant Pain Onset: More than a month ago Pain Frequency: Constant     Nutritional Status: BMI 25 -29 Overweight Nutritional Risks: None Diabetes: Yes CBG done?: No Did pt. bring in CBG monitor from home?: No  How often do you need to have someone help you when you read instructions, pamphlets, or other written materials from your doctor or pharmacy?: 1 - Never What is the last grade level you completed in school?: 12th grade + 1 yr  college  Interpreter Needed?: No  Comments: pt lives with spouse Information entered by :: LPinson, LPN  Past Medical History:  Diagnosis Date  . AAA (abdominal aortic aneurysm) ([place]) 2013   s/p stent graft repair now with supra/pararenal aneurysm 3.5cm, referred to Dr. Sammuel Hines at [facility] for endovascular repair (12/2013)  . Abnormal drug screen 06/2015   see problem list  . Cervical neck pain with evidence of disc disease 07/2011   MRI - disk bulging and foraminal stenosis, advanced at C4/5, 5/6; rec pain management for ESI by Dr. Mack Guise   . CHF (congestive heart failure) ([place])   . COPD (chronic obstructive pulmonary disease) (HCC)    mod-severe COPD/emphysema.  PFTs 12/2010.  He still smokes 1 ppd.  . Depression   . ED (erectile dysfunction) 02/2012   penile injections - failed viagra, poor arterial flow ([place])  . Fatty liver   . GERD (gastroesophageal reflux disease)   . Hyperlipidemia    myalgias with simvastatin and atorvastatin  . Leg cramps    idiopathic severe  . Muscle spasm    chronic  . Neuralgia    pain in hands. L>R from accident  . Obesity   . OSA (obstructive sleep apnea) 03/2012   AHI 18.6, desat to 74%, severe snoring, consider ENT eval  . Osteoarthritis   . Paroxysmal atrial fibrillation (HCC)    on coumadin only.  . Peripheral autonomic neuropathy due to diabetes mellitus ([place])   . Right shoulder injury 05/2012  after fall out of chair, s/p injection, rec conservative management with PT Noemi Chapel)  . Smoker    1ppd  . T2DM (type 2 diabetes mellitus) ([place])    Past Surgical History:  Procedure Laterality Date  . CHOLECYSTECTOMY  2001  . CTA abd  09/2011   6.1cm AAA, bilateral ing hernias, R with bladder wall, promient prostate calcifications  . ENDOVASCULAR STENT INSERTION  11/11/2011   Procedure: ENDOVASCULAR STENT GRAFT INSERTION;  Surgeon: Angelia Mould, MD;  Location: [facility];  Service: Vascular;  Laterality: N/A;  aorta bi iliac  . KNEE  SURGERY     L side cartilage taken out  . PFTs  12/2010   mod-severe obstruction, ?bronchodilator response  . TONSILLECTOMY     Family History  Problem Relation Age of Onset  . Heart disease Father   . Leukemia Father   . Coronary artery disease Father   . Melanoma Sister   . Arthritis Mother   . Diabetes Neg Hx   . Stroke Neg Hx    Social History   Socioeconomic History  . Marital status: Married    Spouse name: Juliann Pulse  . Number of children: 3  . Years of education: 12th gr  . Highest education level: None  Social Needs  . Financial resource strain: None  . Food insecurity - worry: None  . Food insecurity - inability: None  . Transportation needs - medical: None  . Transportation needs - non-medical: None  Occupational History  . Occupation: truck Education administrator: TRANSP[employer]    Comment: for years  . Occupation: Corporate treasurer: [employer]    Comment: runs this  Tobacco Use  . Smoking status: Current Every Day Smoker    Packs/day: 1.50    Years: 52.00    Pack years: 78.00    Types: Cigarettes  . Smokeless tobacco: Never Used  Substance and Sexual Activity  . Alcohol use: Yes    Alcohol/week: 4.2 oz    Types: 7 Glasses of wine per week  . Drug use: No  . Sexual activity: None  Other Topics Concern  . None  Social History Narrative   Caffeine: 1 cup coffee, 1/2 gallon unsweet tea, 2 soda/day   Lives with wife and 1 dog, 6 cats outside.   Occ: Long distance truck driver   Started seeking care when received medicare.   H/o noncompliance    Outpatient Encounter Medications as of 12/14/2017  Medication Sig  . amiodarone (PACERONE) 200 MG tablet Take 0.5 tablets (100 mg total) by mouth daily. (Patient taking differently: Take 200 mg by mouth daily. )  . Coenzyme Q10 100 MG capsule Take 1 capsule (100 mg total) by mouth daily.  . fish oil-omega-3 fatty acids 1000 MG capsule Take 2 g by mouth daily.   Marland Kitchen gabapentin (NEURONTIN) 600 MG tablet   .  PROAIR HFA 108 (90 Base) MCG/ACT inhaler INHALE 2 PUFFS BY MOUTH EVERY 6 HOURS AS NEEDED FOR WHEEZING OR SHORTNESS OF BREATH  . warfarin (COUMADIN) 5 MG tablet TAKE AS DIRECTED BY COUMADIN CLINIC  . diltiazem (CARDIZEM) 30 MG tablet TK 1 T PO QD UTD PRF FAST HEART RATE  . omeprazole (PRILOSEC) 40 MG capsule TAKE 1 CAPSULE BY MOUTH EVERY DAY (Patient not taking: Reported on 12/14/2017)  . potassium chloride (K-DUR,KLOR-CON) 10 MEQ tablet   . [DISCONTINUED] potassium chloride (K-DUR,KLOR-CON) 10 MEQ tablet Take 2 tablets (20 mEq total) 2 (two) times daily by mouth. (Patient  not taking: Reported on 12/14/2017)   No facility-administered encounter medications on file as of 12/14/2017.     Activities of Daily Living In your present state of health, do you have any difficulty performing the following activities: 12/14/2017  Hearing? Y  Vision? N  Difficulty concentrating or making decisions? N  Walking or climbing stairs? N  Dressing or bathing? N  Doing errands, shopping? N  Preparing Food and eating ? N  Using the Toilet? N  In the past six months, have you accidently leaked urine? N  Do you have problems with loss of bowel control? N  Managing your Medications? N  Managing your Finances? N  Housekeeping or managing your Housekeeping? N  Some recent data might be hidden    Patient Care Team: Ria Bush, MD as PCP - General (Family Medicine) Minna Merritts, MD (Cardiology) Thomasene Ripple, MD as Attending Physician (Vascular Surgery)   Assessment:   This is a routine wellness examination for [place].   Hearing Screening   125Hz  250Hz  500Hz  1000Hz  2000Hz  3000Hz  4000Hz  6000Hz  8000Hz   Right ear:   40 40 40  0    Left ear:   40 40 0  0    Vision Screening Comments: Last vision exam in Jan 2019 @ [facility]  Exercise Activities and Dietary recommendations Current Exercise Habits: The patient does not participate in regular exercise at present, Exercise limited by: None identified  Goals     . DIET - INCREASE WATER INTAKE     Starting 12/14/2017, I will continue to drink at least 10 bottles of water daily.        Fall Risk Fall Risk  12/14/2017 09/07/2017 12/09/2016 08/08/2016  Falls in the past year? No Yes Yes Yes  Comment - - pt reports 6 falls in past year Emmi Telephone Survey: data to providers prior to load  Number falls in past yr: - 2 or more 2 or more 2 or more  Comment - - - Emmi Telephone Survey Actual Response = 25  Injury with Fall? - Yes Yes Yes  Risk Factor Category  - - High Fall Risk -  Risk for fall due to : - Impaired balance/gait History of fall(s);Impaired balance/gait -  Follow up - Falls prevention discussed - -   Depression Screen PHQ 2/9 Scores 12/14/2017 09/07/2017 12/09/2016  PHQ - 2 Score 1 0 0  PHQ- 9 Score 2 - -  Exception Documentation - Patient refusal -   Cognitive Function MMSE - Mini Mental State Exam 12/14/2017 12/09/2016  Orientation to time 5 5  Orientation to Place 5 5  Registration 3 3  Attention/ Calculation 0 0  Recall 3 3  Language- name 2 objects 0 0  Language- repeat 1 1  Language- follow 3 step command 2 3  Language- follow 3 step command-comments unable to follow 1 step of 3 step command -  Language- read & follow direction 0 0  Write a sentence 0 0  Copy design 0 0  Total score 19 20     PLEASE NOTE: A Mini-Cog screen was completed. Maximum score is 20. A value of 0 denotes this part of Folstein MMSE was not completed or the patient failed this part of the Mini-Cog screening.   Mini-Cog Screening Orientation to Time - Max 5 pts Orientation to Place - Max 5 pts Registration - Max 3 pts Recall - Max 3 pts Language Repeat - Max 1 pts Language Follow 3 Step Command - Max  3 pts      There is no immunization history on file for this patient.  Screening Tests Health Maintenance  Topic Date Due  . FOOT EXAM  12/17/2017 (Originally 09/26/2017)  . INFLUENZA VACCINE  03/07/2018 (Originally 07/08/2017)  . DTaP/Tdap/Td (1 -  Tdap) 12/09/2053 (Originally 05/03/1965)  . COLONOSCOPY  12/09/2053 (Originally 05/02/1994)  . TETANUS/TDAP  12/09/2053 (Originally 05/03/1965)  . PNA vac Low Risk Adult (1 of 2 - PCV13) 12/09/2053 (Originally 05/02/2009)  . HEMOGLOBIN A1C  04/08/2018  . OPHTHALMOLOGY EXAM  12/11/2018  . URINE MICROALBUMIN  12/14/2018  . Hepatitis C Screening  Completed      Plan:     I have personally reviewed, addressed, and noted the following in the patient's chart:  A. Medical and social history B. Use of alcohol, tobacco or illicit drugs  C. Current medications and supplements D. Functional ability and status E.  Nutritional status F.  Physical activity G. Advance directives H. List of other physicians I.  Hospitalizations, surgeries, and ER visits in previous 12 months J.  [place] to include hearing, vision, cognitive, depression L. Referrals and appointments - none  In addition, I have reviewed and discussed with patient certain preventive protocols, quality metrics, and best practice recommendations. A written personalized care plan for preventive services as well as general preventive health recommendations were provided to patient.  See attached scanned questionnaire for additional information.   Signed,   Lindell Noe, MHA, BS, LPN Health Coach

## 2017-12-14 NOTE — Progress Notes (Signed)
Pre visit review using our clinic review tool, if applicable. No additional management support is needed unless otherwise documented below in the visit note. 

## 2017-12-14 NOTE — Patient Instructions (Signed)
[name] , Thank you for taking time to come for your Medicare Wellness Visit. I appreciate your ongoing commitment to your health goals. Please review the following plan we discussed and let me know if I can assist you in the future.   These are the goals we discussed: Goals    . DIET - INCREASE WATER INTAKE     Starting 12/14/2017, I will continue to drink at least 10 bottles of water daily.        This is a list of the screening recommended for you and due dates:  Health Maintenance  Topic Date Due  . Complete foot exam   12/17/2017*  . Flu Shot  03/07/2018*  . DTaP/Tdap/Td vaccine (1 - Tdap) 12/09/2053*  . Colon Cancer Screening  12/09/2053*  . Tetanus Vaccine  12/09/2053*  . Pneumonia vaccines (1 of 2 - PCV13) 12/09/2053*  . Hemoglobin A1C  04/08/2018  . Eye exam for diabetics  12/11/2018  . Urine Protein Check  12/14/2018  .  Hepatitis C: One time screening is recommended by Center for Disease Control  (CDC) for  adults born from [birth year] through 1967.   Completed  *Topic was postponed. The date shown is not the original due date.   Preventive Care for Adults  A healthy lifestyle and preventive care can promote health and wellness. Preventive health guidelines for adults include the following key practices.  . A routine yearly physical is a good way to check with your health care provider about your health and preventive screening. It is a chance to share any concerns and updates on your health and to receive a thorough exam.  . Visit your dentist for a routine exam and preventive care every 6 months. Brush your teeth twice a day and floss once a day. Good oral hygiene prevents tooth decay and gum disease.  . The frequency of eye exams is based on your age, health, family medical history, use  of contact lenses, and other factors. Follow your health care provider's recommendations for frequency of eye exams.  . Eat a healthy diet. Foods like vegetables, fruits, whole grains,  low-fat dairy products, and lean protein foods contain the nutrients you need without too many calories. Decrease your intake of foods high in solid fats, added sugars, and salt. Eat the right amount of calories for you. Get information about a proper diet from your health care provider, if necessary.  . Regular physical exercise is one of the most important things you can do for your health. Most adults should get at least 150 minutes of moderate-intensity exercise (any activity that increases your heart rate and causes you to sweat) each week. In addition, most adults need muscle-strengthening exercises on 2 or more days a week.  Silver Sneakers may be a benefit available to you. To determine eligibility, you may visit the website: www.silversneakers.com or contact program at (925) 568-0018 Mon-Fri between 8AM-8PM.   . Maintain a healthy weight. The body mass index (BMI) is a screening tool to identify possible weight problems. It provides an estimate of body fat based on height and weight. Your health care provider can find your BMI and can help you achieve or maintain a healthy weight.   For adults 20 years and older: ? A BMI below 18.5 is considered underweight. ? A BMI of 18.5 to 24.9 is normal. ? A BMI of 25 to 29.9 is considered overweight. ? A BMI of 30 and above is considered obese.   . Maintain  normal blood lipids and cholesterol levels by exercising and minimizing your intake of saturated fat. Eat a balanced diet with plenty of fruit and vegetables. Blood tests for lipids and cholesterol should begin at age 2 and be repeated every 5 years. If your lipid or cholesterol levels are high, you are over 50, or you are at high risk for heart disease, you may need your cholesterol levels checked more frequently. Ongoing high lipid and cholesterol levels should be treated with medicines if diet and exercise are not working.  . If you smoke, find out from your health care provider how to quit. If  you do not use tobacco, please do not start.  . If you choose to drink alcohol, please do not consume more than 2 drinks per day. One drink is considered to be 12 ounces (355 mL) of beer, 5 ounces (148 mL) of wine, or 1.5 ounces (44 mL) of liquor.  . If you are 68-22 years old, ask your health care provider if you should take aspirin to prevent strokes.  . Use sunscreen. Apply sunscreen liberally and repeatedly throughout the day. You should seek shade when your shadow is shorter than you. Protect yourself by wearing long sleeves, pants, a wide-brimmed hat, and sunglasses year round, whenever you are outdoors.  . Once a month, do a whole body skin exam, using a mirror to look at the skin on your back. Tell your health care provider of new moles, moles that have irregular borders, moles that are larger than a pencil eraser, or moles that have changed in shape or color.

## 2017-12-15 LAB — REFLEX PSA, FREE
PSA, % FREE: 9 % — AB (ref >25)
PSA, Free: 0.9 ng/mL

## 2017-12-15 LAB — PATHOLOGIST SMEAR REVIEW

## 2017-12-15 LAB — PSA, TOTAL WITH REFLEX TO PSA, FREE: PSA, Total: 9.9 ng/mL — ABNORMAL HIGH (ref <=4.0)

## 2017-12-16 ENCOUNTER — Encounter: Payer: Self-pay | Admitting: Pain Medicine

## 2017-12-16 ENCOUNTER — Ambulatory Visit: Payer: Medicare Other | Attending: Pain Medicine | Admitting: Pain Medicine

## 2017-12-16 ENCOUNTER — Telehealth: Payer: Self-pay | Admitting: Cardiovascular Disease

## 2017-12-16 VITALS — BP 97/61 | HR 67 | Temp 97.8°F | Resp 16 | Ht 69.0 in | Wt 220.0 lb

## 2017-12-16 DIAGNOSIS — I4891 Unspecified atrial fibrillation: Secondary | ICD-10-CM | POA: Diagnosis not present

## 2017-12-16 DIAGNOSIS — G894 Chronic pain syndrome: Secondary | ICD-10-CM | POA: Diagnosis not present

## 2017-12-16 DIAGNOSIS — M25511 Pain in right shoulder: Secondary | ICD-10-CM | POA: Insufficient documentation

## 2017-12-16 DIAGNOSIS — J449 Chronic obstructive pulmonary disease, unspecified: Secondary | ICD-10-CM | POA: Diagnosis not present

## 2017-12-16 DIAGNOSIS — Z9114 Patient's other noncompliance with medication regimen: Secondary | ICD-10-CM | POA: Insufficient documentation

## 2017-12-16 DIAGNOSIS — G8929 Other chronic pain: Secondary | ICD-10-CM

## 2017-12-16 DIAGNOSIS — M19011 Primary osteoarthritis, right shoulder: Secondary | ICD-10-CM | POA: Diagnosis not present

## 2017-12-16 DIAGNOSIS — M150 Primary generalized (osteo)arthritis: Secondary | ICD-10-CM | POA: Diagnosis not present

## 2017-12-16 DIAGNOSIS — I509 Heart failure, unspecified: Secondary | ICD-10-CM | POA: Insufficient documentation

## 2017-12-16 DIAGNOSIS — E1142 Type 2 diabetes mellitus with diabetic polyneuropathy: Secondary | ICD-10-CM | POA: Diagnosis not present

## 2017-12-16 DIAGNOSIS — M159 Polyosteoarthritis, unspecified: Secondary | ICD-10-CM

## 2017-12-16 DIAGNOSIS — E1165 Type 2 diabetes mellitus with hyperglycemia: Secondary | ICD-10-CM | POA: Insufficient documentation

## 2017-12-16 DIAGNOSIS — Z789 Other specified health status: Secondary | ICD-10-CM | POA: Diagnosis not present

## 2017-12-16 DIAGNOSIS — F17210 Nicotine dependence, cigarettes, uncomplicated: Secondary | ICD-10-CM | POA: Insufficient documentation

## 2017-12-16 DIAGNOSIS — K760 Fatty (change of) liver, not elsewhere classified: Secondary | ICD-10-CM | POA: Diagnosis not present

## 2017-12-16 DIAGNOSIS — G4700 Insomnia, unspecified: Secondary | ICD-10-CM | POA: Diagnosis not present

## 2017-12-16 DIAGNOSIS — I110 Hypertensive heart disease with heart failure: Secondary | ICD-10-CM | POA: Insufficient documentation

## 2017-12-16 DIAGNOSIS — N400 Enlarged prostate without lower urinary tract symptoms: Secondary | ICD-10-CM | POA: Diagnosis not present

## 2017-12-16 DIAGNOSIS — G4733 Obstructive sleep apnea (adult) (pediatric): Secondary | ICD-10-CM | POA: Insufficient documentation

## 2017-12-16 DIAGNOSIS — M5030 Other cervical disc degeneration, unspecified cervical region: Secondary | ICD-10-CM | POA: Diagnosis not present

## 2017-12-16 DIAGNOSIS — M7521 Bicipital tendinitis, right shoulder: Secondary | ICD-10-CM | POA: Diagnosis not present

## 2017-12-16 DIAGNOSIS — K219 Gastro-esophageal reflux disease without esophagitis: Secondary | ICD-10-CM | POA: Diagnosis not present

## 2017-12-16 DIAGNOSIS — E559 Vitamin D deficiency, unspecified: Secondary | ICD-10-CM | POA: Diagnosis not present

## 2017-12-16 DIAGNOSIS — M47812 Spondylosis without myelopathy or radiculopathy, cervical region: Secondary | ICD-10-CM | POA: Insufficient documentation

## 2017-12-16 DIAGNOSIS — M4802 Spinal stenosis, cervical region: Secondary | ICD-10-CM | POA: Insufficient documentation

## 2017-12-16 DIAGNOSIS — E785 Hyperlipidemia, unspecified: Secondary | ICD-10-CM | POA: Insufficient documentation

## 2017-12-16 DIAGNOSIS — Z7901 Long term (current) use of anticoagulants: Secondary | ICD-10-CM | POA: Diagnosis not present

## 2017-12-16 DIAGNOSIS — M71522 Other bursitis, not elsewhere classified, left elbow: Secondary | ICD-10-CM | POA: Diagnosis not present

## 2017-12-16 DIAGNOSIS — M4722 Other spondylosis with radiculopathy, cervical region: Secondary | ICD-10-CM | POA: Diagnosis not present

## 2017-12-16 DIAGNOSIS — Z79899 Other long term (current) drug therapy: Secondary | ICD-10-CM | POA: Insufficient documentation

## 2017-12-16 DIAGNOSIS — E538 Deficiency of other specified B group vitamins: Secondary | ICD-10-CM | POA: Diagnosis not present

## 2017-12-16 DIAGNOSIS — R202 Paresthesia of skin: Secondary | ICD-10-CM | POA: Insufficient documentation

## 2017-12-16 DIAGNOSIS — N529 Male erectile dysfunction, unspecified: Secondary | ICD-10-CM | POA: Insufficient documentation

## 2017-12-16 DIAGNOSIS — M25512 Pain in left shoulder: Secondary | ICD-10-CM | POA: Insufficient documentation

## 2017-12-16 DIAGNOSIS — E669 Obesity, unspecified: Secondary | ICD-10-CM | POA: Insufficient documentation

## 2017-12-16 DIAGNOSIS — Z7984 Long term (current) use of oral hypoglycemic drugs: Secondary | ICD-10-CM | POA: Insufficient documentation

## 2017-12-16 DIAGNOSIS — I714 Abdominal aortic aneurysm, without rupture: Secondary | ICD-10-CM | POA: Diagnosis not present

## 2017-12-16 MED ORDER — LIDOCAINE HCL 2 % IJ SOLN
20.0000 mL | Freq: Once | INTRAMUSCULAR | Status: AC
  Administered 2017-12-16: 200 mg
  Filled 2017-12-16: qty 40

## 2017-12-16 MED ORDER — ROPIVACAINE HCL 2 MG/ML IJ SOLN
4.0000 mL | Freq: Once | INTRAMUSCULAR | Status: AC
  Administered 2017-12-16: 10 mL
  Filled 2017-12-16: qty 10

## 2017-12-16 MED ORDER — CALCIUM PLUS D3 ABSORBABLE 600-2500 MG-UNIT PO CAPS: 1.0000 | ORAL_CAPSULE | Freq: Every day | ORAL | 0 refills | Status: DC

## 2017-12-16 MED ORDER — CHOLECALCIFEROL 125 MCG (5000 UT) PO CAPS: 5000.0000 [IU] | ORAL_CAPSULE | Freq: Every day | ORAL | 0 refills | Status: AC

## 2017-12-16 MED ORDER — CYANOCOBALAMIN 2000 MCG PO TABS: 2000.0000 ug | ORAL_TABLET | Freq: Every day | ORAL | 0 refills | Status: DC

## 2017-12-16 MED ORDER — MAGNESIUM OXIDE -MG SUPPLEMENT 500 MG PO CAPS: 1.0000 | ORAL_CAPSULE | Freq: Two times a day (BID) | ORAL | 2 refills | Status: DC

## 2017-12-16 MED ORDER — TRIAMCINOLONE ACETONIDE 40 MG/ML IJ SUSP
40.0000 mg | Freq: Once | INTRAMUSCULAR | Status: AC
  Administered 2017-12-16: 40 mg
  Filled 2017-12-16: qty 1

## 2017-12-16 MED ORDER — OXYCODONE HCL 5 MG PO TABS: 5.0000 mg | ORAL_TABLET | Freq: Four times a day (QID) | ORAL | 0 refills | Status: DC | PRN

## 2017-12-16 MED ORDER — ERGOCALCIFEROL 1.25 MG (50000 UT) PO CAPS: 50000.0000 [IU] | ORAL_CAPSULE | ORAL | 0 refills | Status: AC

## 2017-12-16 NOTE — Telephone Encounter (Signed)
Please review if this is okay to refill, Per Dr. Donivan Scull last note 10/12/2017 he was to start taking Amiodarone 200MG  1/2 tablet daily. Patient is requesting it TID.

## 2017-12-16 NOTE — Progress Notes (Signed)
Pharmacist called our office to notify that patient filled a prescription for Norco 10/325mg  on 12-04-2017, #90. Dr. Dossie Arbour notified. Instructed to allow pharmacist to fill Oxycodone written today to be filled. Pharmacist and myself will notify patient he must bring 51 tablets for count and waste before any further opiode prescriptions will be written. Pharmacist Ronalee Belts notified of this. Patient contacted by myself and notified of this.

## 2017-12-16 NOTE — Telephone Encounter (Signed)
°*  STAT* If patient is at the pharmacy, call can be transferred to refill team.   1. Which medications need to be refilled? (please list name of each medication and dose if known)    Amiodarone 200 mg PO TID   2. Which pharmacy/location (including street and city if local pharmacy) is medication to be sent to?     [place]  3. Do they need a 30 day or 90 day supply? [place]

## 2017-12-16 NOTE — Telephone Encounter (Signed)
Spoke with patient and he states that he is taking the amiodarone as needed and currently has been taking whole pill three times daily. He reports that when he doesn't need it he is taking 1/2 pill once daily but that when his heart rate gets higher than 77 his blood pressures are systolic in the 44'C and he is very tired. He states that he takes doses with elevated heart rates and for now the whole tablet three times daily has been working for him. He is requesting refill for Amiodarone 200 mg three times daily because this is what works for him now. He is requesting refill and needs [place] to send in updated script. Advised that I would need to discuss with Dr. Rockey Situ before sending new order in. He verbalized understanding with no further questions at this time.

## 2017-12-16 NOTE — Telephone Encounter (Signed)
Left voicemail message for patient to call back.

## 2017-12-16 NOTE — Patient Instructions (Addendum)
____________________________________________________________________________________________  Post-Procedure instructions Instructions:  Apply ice: Fill a plastic sandwich bag with crushed ice. Cover it with a small towel and apply to injection site. Apply for 15 minutes then remove x 15 minutes. Repeat sequence on day of procedure, until you go to bed. The purpose is to minimize swelling and discomfort after procedure.  Apply heat: Apply heat to procedure site starting the day following the procedure. The purpose is to treat any soreness and discomfort from the procedure.  Food intake: Start with clear liquids (like water) and advance to regular food, as tolerated.   Physical activities: Keep activities to a minimum for the first 8 hours after the procedure.   Driving: If you have received any sedation, you are not allowed to drive for 24 hours after your procedure.  Blood thinner: Restart your blood thinner 6 hours after your procedure. (Only for those taking blood thinners)  Insulin: As soon as you can eat, you may resume your normal dosing schedule. (Only for those taking insulin)  Infection prevention: Keep procedure site clean and dry.  Post-procedure Pain Diary: Extremely important that this be done correctly and accurately. Recorded information will be used to determine the next step in treatment.  Pain evaluated is that of treated area only. Do not include pain from an untreated area.  Complete every hour, on the hour, for the initial 8 hours. Set an alarm to help you do this part accurately.  Do not go to sleep and have it completed later. It will not be accurate.  Follow-up appointment: Keep your follow-up appointment after the procedure. Usually 2 weeks for most procedures. (6 weeks in the case of radiofrequency.) Bring you pain diary.  Expect:  From numbing medicine (AKA: Local Anesthetics): Numbness or decrease in pain.  Onset: Full effect within 15 minutes of  injected.  Duration: It will depend on the type of local anesthetic used. On the average, 1 to 8 hours.   From steroids: Decrease in swelling or inflammation. Once inflammation is improved, relief of the pain will follow.  Onset of benefits: Depends on the amount of swelling present. The more swelling, the longer it will take for the benefits to be seen. In some cases, up to 10 days.  Duration: Steroids will stay in the system x 2 weeks. Duration of benefits will depend on multiple posibilities including persistent irritating factors.  From procedure: Some discomfort is to be expected once the numbing medicine wears off. This should be minimal if ice and heat are applied as instructed. Call if:  You experience numbness and weakness that gets worse with time, as opposed to wearing off.  New onset bowel or bladder incontinence. (Spinal procedures only)  Emergency Numbers:  Durning business hours (Monday - Thursday, 8:00 AM - 4:00 PM) (Friday, 9:00 AM - 12:00 Noon): [phone]  After hours: [phone] ____________________________________________________________________________________________    ____________________________________________________________________________________________  Appointment Policy Summary  It is our goal and responsibility to provide the medical community with assistance in the evaluation and management of patients with chronic pain. Unfortunately our resources are limited. Because we do not have an unlimited amount of time, or available appointments, we are required to closely monitor and manage their use. The following rules exist to maximize their use:  Patient's responsibilities: 1. Punctuality:  At what time should I arrive? You should be physically present in our office 30 minutes before your scheduled appointment. Your scheduled appointment is with your assigned healthcare provider. However, it takes 5-10 minutes  to be "checked-in", and another  15 minutes for the nurses to do the admission. If you arrive to our office at the time you were given for your appointment, you will end up being at least 20-25 minutes late to your appointment with the provider. 2. Tardiness:  What happens if I arrive only a few minutes after my scheduled appointment time? You will need to reschedule your appointment. The cutoff is your appointment time. This is why it is so important that you arrive at least 30 minutes before that appointment. If you have an appointment scheduled for 10:00 AM and you arrive at 10:01, you will be required to reschedule your appointment.  3. Plan ahead:  Always assume that you will encounter traffic on your way in. Plan for it. If you are dependent on a driver, make sure they understand these rules and the need to arrive early. 4. Other appointments and responsibilities:  Avoid scheduling any other appointments before or after your pain clinic appointments.  5. Be prepared:  Write down everything that you need to discuss with your healthcare provider and give this information to the admitting nurse. Write down the medications that you will need refilled. Bring your pills and bottles (even the empty ones), to all of your appointments, except for those where a procedure is scheduled. 6. No children or pets:  Find someone to take care of them. It is not appropriate to bring them in. 7. Scheduling changes:  We request "advanced notification" of any changes or cancellations. 8. Advanced notification:  Defined as a time period of more than 24 hours prior to the originally scheduled appointment. This allows for the appointment to be offered to other patients. 9. Rescheduling:  When a visit is rescheduled, it will require the cancellation of the original appointment. For this reason they both fall within the category of "Cancellations".  10. Cancellations:  They require advanced notification. Any cancellation less than 24 hours before the   appointment will be recorded as a "No Show". 11. No Show:  Defined as an unkept appointment where the patient failed to notify or declare to the practice their intention or inability to keep the appointment.  Corrective process for repeat offenders:  1. Tardiness: Three (3) episodes of rescheduling due to late arrivals will be recorded as one (1) "No Show". 2. Cancellation or reschedule: Three (3) cancellations or rescheduling will be recorded as one (1) "No Show". 3. "No Shows": Three (3) "No Shows" within a 12 month period will result in discharge from the practice.  ____________________________________________________________________________________________  ____________________________________________________________________________________________  DRUG HOLIDAYS  What is a "Drug Holiday"? Drug Holiday: is the name given to the period of time during which a patient stops taking a medication(s) for the purpose of eliminating tolerance to the drug.  Benefits . Improved effectiveness of opioids. . Decreased opioid dose needed to achieve benefits. . Improved pain with lesser dose.  What is tolerance? Tolerance: is the progressive decreased in effectiveness of a drug due to its repetitive use. With repetitive use, the body gets use to the medication and as a consequence, it loses its effectiveness. This is a common problem seen with opioid pain medications. As a result, a larger dose of the drug is needed to achieve the same effect that used to be obtained with a smaller dose.  How long should a "Drug Holiday" last? At least 14 consecutive days. (2 weeks)  What are withdrawals? Withdrawals: refers to the wide range of symptoms that occur after  stopping or dramatically reducing opiate drugs after heavy and prolonged use. Withdrawal symptoms do not occur to patients that use low dose opioids, or those who take the medication sporadically. Contrary to benzodiazepine (example: Valium, Xanax,  etc.) or alcohol withdrawals ("Delirium Tremens"), opioid withdrawals are not lethal. Withdrawals are the physical manifestation of the body getting rid of the excess receptors.  Expected Symptoms Early symptoms of withdrawal may include: . Agitation . Anxiety . Muscle aches . Increased tearing . Insomnia . Runny nose . Sweating . Yawning  Late symptoms of withdrawal may include: . Abdominal cramping . Diarrhea . Dilated pupils . Goose bumps . Nausea . Vomiting  Will I experience withdrawals? Due to the slow nature of the taper, it is very unlikely that you will experience any.  What is a slow taper? Taper: refers to the gradual decrease in dose. ___________________________________________________________________________________________  ____________________________________________________________________________________________  Medication Rules  Applies to: All patients receiving prescriptions (written or electronic).  Pharmacy of record: Pharmacy where electronic prescriptions will be sent. If written prescriptions are taken to a different pharmacy, please inform the nursing staff. The pharmacy listed in the electronic medical record should be the one where you would like electronic prescriptions to be sent.  Prescription refills: Only during scheduled appointments. Applies to both, written and electronic prescriptions.  NOTE: The following applies primarily to controlled substances (Opioid* Pain Medications).   Patient's responsibilities: 1. Pain Pills: Bring all pain pills to every appointment (except for procedure appointments). 2. Pill Bottles: Bring pills in original pharmacy bottle. Always bring newest bottle. Bring bottle, even if empty. 3. Medication refills: You are responsible for knowing and keeping track of what medications you need refilled. The day before your appointment, write a list of all prescriptions that need to be refilled. Bring that list to your  appointment and give it to the admitting nurse. Prescriptions will be written only during appointments. If you forget a medication, it will not be "Called in", "Faxed", or "electronically sent". You will need to get another appointment to get these prescribed. 4. Prescription Accuracy: You are responsible for carefully inspecting your prescriptions before leaving our office. Have the discharge nurse carefully go over each prescription with you, before taking them home. Make sure that your name is accurately spelled, that your address is correct. Check the name and dose of your medication to make sure it is accurate. Check the number of pills, and the written instructions to make sure they are clear and accurate. Make sure that you are given enough medication to last until your next medication refill appointment. 5. Taking Medication: Take medication as prescribed. Never take more pills than instructed. Never take medication more frequently than prescribed. Taking less pills or less frequently is permitted and encouraged, when it comes to controlled substances (written prescriptions).  6. Inform other Doctors: Always inform, all of your healthcare providers, of all the medications you take. 7. Pain Medication from other Providers: You are not allowed to accept any additional pain medication from any other Doctor or Healthcare provider. There are two exceptions to this rule. (see below) In the event that you require additional pain medication, you are responsible for notifying us, as stated below. 8. Medication Agreement: You are responsible for carefully reading and following our Medication Agreement. This must be signed before receiving any prescriptions from our practice. Safely store a copy of your signed Agreement. Violations to the Agreement will result in no further prescriptions. (Additional copies of our Medication Agreement are available upon  request.) 9. Laws, Rules, & Regulations: All patients are  expected to follow all Federal and [employer], [employer], Rules, [employer]. Ignorance of the Laws does not constitute a valid excuse. The use of any illegal substances is prohibited. 10. Adopted CDC guidelines & recommendations: Target dosing levels will be at or below 60 MME/day. Use of benzodiazepines** is not recommended.  Exceptions: There are only two exceptions to the rule of not receiving pain medications from other Healthcare Providers. 1. Exception #1 (Emergencies): In the event of an emergency (i.e.: accident requiring emergency care), you are allowed to receive additional pain medication. However, you are responsible for: As soon as you are able, call our office [phone], at any time of the day or night, and leave a message stating your name, the date and nature of the emergency, and the name and dose of the medication prescribed. In the event that your call is answered by a member of our staff, make sure to document and save the date, time, and the name of the person that took your information.  2. Exception #2 (Planned Surgery): In the event that you are scheduled by another doctor or dentist to have any type of surgery or procedure, you are allowed (for a period no longer than 30 days), to receive additional pain medication, for the acute post-op pain. However, in this case, you are responsible for picking up a copy of our "Post-op Pain Management for Surgeons" handout, and giving it to your surgeon or dentist. This document is available at our office, and does not require an appointment to obtain it. Simply go to our office during business hours (Monday-Thursday from 8:00 AM to 4:00 PM) (Friday 8:00 AM to 12:00 Noon) or if you have a scheduled appointment with [place], prior to your surgery, and ask for it by name. In addition, you will need to provide [place] with your name, name of your surgeon, type of surgery, and date of procedure or surgery.  *Opioid medications include: morphine, codeine,  oxycodone, oxymorphone, hydrocodone, hydromorphone, meperidine, tramadol, tapentadol, buprenorphine, fentanyl, methadone. **Benzodiazepine medications include: diazepam (Valium), alprazolam (Xanax), clonazepam (Klonopine), lorazepam (Ativan), clorazepate (Tranxene), chlordiazepoxide (Librium), estazolam (Prosom), oxazepam (Serax), temazepam (Restoril), triazolam (Halcion)  ____________________________________________________________________________________________ ____________________________________________________________________________________________  Pain Scale  Introduction: The pain score used by this practice is the Verbal Numerical Rating Scale (VNRS-11). This is an 11-point scale. It is for adults and children 10 years or older. There are significant differences in how the pain score is reported, used, and applied. Forget everything you learned in the past and learn this scoring system.  General Information: The scale should reflect your current level of pain. Unless you are specifically asked for the level of your worst pain, or your average pain. If you are asked for one of these two, then it should be understood that it is over the past 24 hours.  Basic Activities of Daily Living (ADL): Personal hygiene, dressing, eating, transferring, and using restroom.  Instructions: Most patients tend to report their level of pain as a combination of two factors, their physical pain and their psychosocial pain. This last one is also known as "suffering" and it is reflection of how physical pain affects you socially and psychologically. From now on, report them separately. From this point on, when asked to report your pain level, report only your physical pain. Use the following table for reference.  Pain Clinic Pain Levels (0-5/10)  Pain Level Score  Description  No Pain 0   Mild pain  1 Nagging, annoying, but does not interfere with basic activities of daily living (ADL). Patients are able to  eat, bathe, get dressed, toileting (being able to get on and off the toilet and perform personal hygiene functions), transfer (move in and out of bed or a chair without assistance), and maintain continence (able to control bladder and bowel functions). Blood pressure and heart rate are unaffected. A normal heart rate for a healthy adult ranges from 60 to 100 bpm (beats per minute).   Mild to moderate pain 2 Noticeable and distracting. Impossible to hide from other people. More frequent flare-ups. Still possible to adapt and function close to normal. It can be very annoying and may have occasional stronger flare-ups. With discipline, patients may get used to it and adapt.   Moderate pain 3 Interferes significantly with activities of daily living (ADL). It becomes difficult to feed, bathe, get dressed, get on and off the toilet or to perform personal hygiene functions. Difficult to get in and out of bed or a chair without assistance. Very distracting. With effort, it can be ignored when deeply involved in activities.   Moderately severe pain 4 Impossible to ignore for more than a few minutes. With effort, patients may still be able to manage work or participate in some social activities. Very difficult to concentrate. Signs of autonomic nervous system discharge are evident: dilated pupils (mydriasis); mild sweating (diaphoresis); sleep interference. Heart rate becomes elevated (>115 bpm). Diastolic blood pressure (lower number) rises above 100 mmHg. Patients find relief in laying down and not moving.   Severe pain 5 Intense and extremely unpleasant. Associated with frowning face and frequent crying. Pain overwhelms the senses.  Ability to do any activity or maintain social relationships becomes significantly limited. Conversation becomes difficult. Pacing back and forth is common, as getting into a comfortable position is nearly impossible. Pain wakes you up from deep sleep. Physical signs will be obvious:  pupillary dilation; increased sweating; goosebumps; brisk reflexes; cold, clammy hands and feet; nausea, vomiting or dry heaves; loss of appetite; significant sleep disturbance with inability to fall asleep or to remain asleep. When persistent, significant weight loss is observed due to the complete loss of appetite and sleep deprivation.  Blood pressure and heart rate becomes significantly elevated. Caution: If elevated blood pressure triggers a pounding headache associated with blurred vision, then the patient should immediately seek attention at an urgent or emergency care unit, as these may be signs of an impending stroke.    Emergency Department Pain Levels (6-10/10)  Emergency Room Pain 6 Severely limiting. Requires emergency care and should not be seen or managed at an outpatient pain management facility. Communication becomes difficult and requires great effort. Assistance to reach the emergency department may be required. Facial flushing and profuse sweating along with potentially dangerous increases in heart rate and blood pressure will be evident.   Distressing pain 7 Self-care is very difficult. Assistance is required to transport, or use restroom. Assistance to reach the emergency department will be required. Tasks requiring coordination, such as bathing and getting dressed become very difficult.   Disabling pain 8 Self-care is no longer possible. At this level, pain is disabling. The individual is unable to do even the most "basic" activities such as walking, eating, bathing, dressing, transferring to a bed, or toileting. Fine motor skills are lost. It is difficult to think clearly.   Incapacitating pain 9 Pain becomes incapacitating. Thought processing is no longer possible. Difficult to remember your own name. Control  of movement and coordination are lost.   The worst pain imaginable 10 At this level, most patients pass out from pain. When this level is reached, collapse of the autonomic  nervous system occurs, leading to a sudden drop in blood pressure and heart rate. This in turn results in a temporary and dramatic drop in blood flow to the brain, leading to a loss of consciousness. Fainting is one of the body's self defense mechanisms. Passing out puts the brain in a calmed state and causes it to shut down for a while, in order to begin the healing process.    Summary: 1. Refer to this scale when providing [place] with your pain level. 2. Be accurate and careful when reporting your pain level. This will help with your care. 3. Over-reporting your pain level will lead to loss of credibility. 4. Even a level of 1/10 means that there is pain and will be treated at our facility. 5. High, inaccurate reporting will be documented as "Symptom Exaggeration", leading to loss of credibility and suspicions of possible secondary gains such as obtaining more narcotics, or wanting to appear disabled, for fraudulent reasons. 6. Only pain levels of 5 or below will be seen at our facility. 7. Pain levels of 6 and above will be sent to the Emergency Department and the appointment cancelled. ____________________________________________________________________________________________  Return all pain medication (Hydrocodone) you may have left at home. Do not take any more hydrocodone.

## 2017-12-16 NOTE — Progress Notes (Signed)
I reviewed health advisor's note, was available for consultation, and agree with documentation and plan.  

## 2017-12-16 NOTE — Telephone Encounter (Signed)
[name] returning call  Please return call at [phone]

## 2017-12-17 ENCOUNTER — Ambulatory Visit: Payer: Medicare Other | Admitting: Family Medicine

## 2017-12-17 MED ORDER — AMIODARONE HCL 200 MG PO TABS: 200.0000 mg | ORAL_TABLET | Freq: Three times a day (TID) | ORAL | 6 refills | Status: DC

## 2017-12-17 NOTE — Telephone Encounter (Signed)
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

## 2017-12-17 NOTE — Telephone Encounter (Signed)
S/w patient. I let him know Dr Donivan Scull advice. He stated "Getting a pacemaker is out of the question. I have had low heart rates for 2 years now and why is everyone so worried about it now." Advised it is a fine line in making sure he is treated safely and effectively with medications. He verbalized understanding. Prescription sent to pharmacy.

## 2017-12-17 NOTE — Telephone Encounter (Signed)
Patient is out of meds and wife wants to know when rx is going to be called in

## 2017-12-18 ENCOUNTER — Ambulatory Visit (INDEPENDENT_AMBULATORY_CARE_PROVIDER_SITE_OTHER)
Admission: RE | Admit: 2017-12-18 | Discharge: 2017-12-18 | Disposition: A | Discharge status: Home / Self Care | Payer: Medicare Other | Source: Ambulatory Visit | Attending: Family Medicine | Admitting: Family Medicine

## 2017-12-18 ENCOUNTER — Encounter: Payer: Self-pay | Admitting: Family Medicine

## 2017-12-18 ENCOUNTER — Ambulatory Visit (INDEPENDENT_AMBULATORY_CARE_PROVIDER_SITE_OTHER): Payer: Medicare Other | Admitting: Family Medicine

## 2017-12-18 VITALS — BP 118/60 | HR 80 | Temp 97.9°F | Wt 229.0 lb

## 2017-12-18 DIAGNOSIS — E782 Mixed hyperlipidemia: Secondary | ICD-10-CM

## 2017-12-18 DIAGNOSIS — I4891 Unspecified atrial fibrillation: Secondary | ICD-10-CM | POA: Diagnosis not present

## 2017-12-18 DIAGNOSIS — J449 Chronic obstructive pulmonary disease, unspecified: Secondary | ICD-10-CM | POA: Diagnosis not present

## 2017-12-18 DIAGNOSIS — E538 Deficiency of other specified B group vitamins: Secondary | ICD-10-CM | POA: Diagnosis not present

## 2017-12-18 DIAGNOSIS — C61 Malignant neoplasm of prostate: Secondary | ICD-10-CM | POA: Insufficient documentation

## 2017-12-18 DIAGNOSIS — Z79899 Other long term (current) drug therapy: Secondary | ICD-10-CM | POA: Diagnosis not present

## 2017-12-18 DIAGNOSIS — R634 Abnormal weight loss: Secondary | ICD-10-CM | POA: Diagnosis not present

## 2017-12-18 DIAGNOSIS — Z720 Tobacco use: Secondary | ICD-10-CM | POA: Diagnosis not present

## 2017-12-18 DIAGNOSIS — R972 Elevated prostate specific antigen [PSA]: Secondary | ICD-10-CM | POA: Diagnosis not present

## 2017-12-18 DIAGNOSIS — E559 Vitamin D deficiency, unspecified: Secondary | ICD-10-CM

## 2017-12-18 DIAGNOSIS — R197 Diarrhea, unspecified: Secondary | ICD-10-CM | POA: Diagnosis not present

## 2017-12-18 DIAGNOSIS — D509 Iron deficiency anemia, unspecified: Secondary | ICD-10-CM

## 2017-12-18 HISTORY — DX: Malignant neoplasm of prostate: C61

## 2017-12-18 LAB — POC URINALSYSI DIPSTICK (AUTOMATED)
BILIRUBIN UA: NEGATIVE
Blood, UA: NEGATIVE
KETONES UA: NEGATIVE
LEUKOCYTES UA: NEGATIVE
Nitrite, UA: NEGATIVE
PH UA: 6 (ref 5.0–8.0)
Protein, UA: NEGATIVE
Spec Grav, UA: 1.015 (ref 1.010–1.025)
Urobilinogen, UA: 0.2 E.U./dL

## 2017-12-18 MED ORDER — FERROUS SULFATE 325 (65 FE) MG PO TABS: 325.0000 mg | ORAL_TABLET | Freq: Two times a day (BID) | ORAL | Status: DC

## 2017-12-18 NOTE — Addendum Note (Signed)
Addended by: Ellamae Sia on: 12/18/2017 03:32 PM   Modules accepted: Orders

## 2017-12-18 NOTE — Progress Notes (Signed)
BP 118/60 (BP Location: Left Arm, Patient Position: Sitting, Cuff Size: Normal)   Pulse 80   Temp 97.9 F (36.6 C) (Oral)   Wt 229 lb (103.9 kg)   SpO2 97%   BMI 33.82 kg/m    CC: review labs Subjective:    Patient ID: [name], male    DOB: 1946/05/22, 72 y.o.   MRN: [id number]  HPI: [name] is a 72 y.o. male presenting on 12/18/2017 for Review labs and Wheezing (C/o wheezing, congestion and snoring every night)   Feels well today. More trouble with congestion and snoring and wheezing at night time, but tends to do well during the day. Diarrhea has resolved off metformin. Now he's somewhat constipated but this seems to be managed well with PRN stool miralax. Taking lasix only a few a week. Denies significant pain anywhere.   Saw pain management earlier this week - with steroid shot into R shoulder with good improvement, took 2 oxycodone out of entire bottle  Longstanding GI history of recurrent diarrheal episodes - late last year metformin and lasix were held - this helped significantly - now more constipated and has to take stool softeners. GI appt scheduled for next week.   Weight tends to fluctuate - today up 24 lbs since 10/2017.  He tends to independently manage his medications latest increasing amiodarone to 1 tab TID (was supposed to be on 1/2 tab daily). Cards has reviewed this.   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 Lab Results  Component Value Date   INR 2.2 12/02/2017   INR 2.6 11/02/2017   INR 2.6 10/21/2017   Lab Results  Component Value Date   PSA 11.51 (H) 07/16/2016   PSA 2.23 03/21/2010  PSA today 9.9, free PSA 9.  Saw urology Dr Avanell Shackleton 08/2016 - at that time plan was to rpt 3 months (around time of AAA repair) but seems was lost to f/u.   Relevant past medical, surgical, family and social history reviewed and updated as indicated. Interim medical history since our last visit reviewed. Allergies and medications  reviewed and updated. Outpatient Medications Prior to Visit  Medication Sig Dispense Refill  . amiodarone (PACERONE) 200 MG tablet Take 1 tablet (200 mg total) by mouth 3 (three) times daily. 90 tablet 6  . Calcium Carb-Cholecalciferol (CALCIUM PLUS D3 ABSORBABLE) [phone] MG-UNIT CAPS Take 1 capsule by mouth daily with breakfast. 90 capsule 0  . Cholecalciferol 5000 units capsule Take 1 capsule (5,000 Units total) by mouth daily. 90 capsule 0  . Coenzyme Q10 100 MG capsule Take 1 capsule (100 mg total) by mouth daily. 30 capsule 0  . cyanocobalamin ([employer] VITAMIN B12) 2000 MCG tablet Take 1 tablet (2,000 mcg total) by mouth daily. 90 tablet 0  . diltiazem (CARDIZEM) 30 MG tablet TK 1 T PO QD UTD PRF FAST HEART RATE  3  . ergocalciferol (VITAMIN D2) 50000 units capsule Take 1 capsule (50,000 Units total) by mouth 2 (two) times a week. X 6 weeks. 12 capsule 0  . fish oil-omega-3 fatty acids 1000 MG capsule Take 2 g by mouth daily.     Marland Kitchen gabapentin (NEURONTIN) 600 MG tablet 600 mg 2 (two) times daily.     . Magnesium Oxide 500 MG CAPS Take 1 capsule (500 mg total) by mouth 2 (two) times daily at 8 am and 10 pm. 60 capsule 2  . PROAIR HFA 108 (90 Base) MCG/ACT inhaler INHALE 2 PUFFS BY  MOUTH EVERY 6 HOURS AS NEEDED FOR WHEEZING OR SHORTNESS OF BREATH 17 g 5  . warfarin (COUMADIN) 5 MG tablet TAKE AS DIRECTED BY COUMADIN CLINIC 100 tablet 0  . furosemide (LASIX) 20 MG tablet Take 1 tablet (20 mg total) by mouth daily as needed for edema.    Marland Kitchen oxyCODONE (OXY IR/ROXICODONE) 5 MG immediate release tablet Take 1 tablet (5 mg total) by mouth every 6 (six) hours as needed for severe pain. (Patient not taking: Reported on 12/18/2017) 120 tablet 0   No facility-administered medications prior to visit.      Per HPI unless specifically indicated in ROS section below Review of Systems     Objective:    BP 118/60 (BP Location: Left Arm, Patient Position: Sitting, Cuff Size: Normal)   Pulse 80   Temp 97.9  F (36.6 C) (Oral)   Wt 229 lb (103.9 kg)   SpO2 97%   BMI 33.82 kg/m   Wt Readings from Last 3 Encounters:  12/18/17 229 lb (103.9 kg)  12/16/17 220 lb (99.8 kg)  12/14/17 230 lb 8 oz (104.6 kg)    Physical Exam  Constitutional: He appears well-developed and well-nourished. No distress.  HENT:  Mouth/Throat: Oropharynx is clear and moist. No oropharyngeal exudate.  Eyes: Conjunctivae are normal. Pupils are equal, round, and reactive to light.  Neck: Normal range of motion. Neck supple. No thyromegaly present.  Cardiovascular: Normal rate, regular rhythm, normal heart sounds and intact distal pulses.  No murmur heard. Pulmonary/Chest: Effort normal. No respiratory distress. He has no wheezes. He has no rhonchi. He has no rales.  Coarse but nonfocal  Abdominal: Soft. Bowel sounds are normal. He exhibits no distension and no mass. There is no tenderness. There is no rebound and no guarding.  Musculoskeletal: He exhibits no edema.  Lymphadenopathy:       Head (right side): No submental, no submandibular, no tonsillar, no preauricular and no posterior auricular adenopathy present.       Head (left side): No submental, no submandibular, no tonsillar, no preauricular and no posterior auricular adenopathy present.    He has no axillary adenopathy.       Right axillary: No lateral adenopathy present.       Left axillary: No lateral adenopathy present.      Right: No supraclavicular adenopathy present.       Left: No supraclavicular adenopathy present.  Skin: Skin is warm and dry. No rash noted.  Psychiatric: He has a normal mood and affect.  Nursing note and vitals reviewed.  Results for orders placed or performed in visit on 12/18/17  POCT Urinalysis Dipstick (Automated)  Result Value Ref Range   Color, UA yellow    Clarity, UA clear    Glucose, UA 3+    Bilirubin, UA negative    Ketones, UA negative    Spec Grav, UA 1.015 1.010 - 1.025   Blood, UA negative    pH, UA 6.0 5.0 -  8.0   Protein, UA negative    Urobilinogen, UA 0.2 0.2 or 1.0 E.U./dL   Nitrite, UA negative    Leukocytes, UA Negative Negative   Lab Results  Component Value Date   HGBA1C 7.1 (H) 10/09/2017    Lab Results  Component Value Date   ALT 8 10/09/2017   AST 11 10/09/2017   ALKPHOS 75 10/09/2017   BILITOT 0.5 10/09/2017   Lab Results  Component Value Date   CREATININE 1.03 10/09/2017   BUN 12 10/09/2017  NA 139 10/09/2017   K 4.3 10/09/2017   CL 103 10/09/2017   CO2 30 10/09/2017       Assessment & Plan:   Problem List Items Addressed This Visit    Atrial fibrillation [facility])    Appreciate cards care.       Relevant Medications   furosemide (LASIX) 20 MG tablet   COPD mixed type ([place])   Diarrhea    Endorses this has resolved with discontinuation of metformin.       Relevant Orders   Gastrointestinal Pathogen Panel PCR   Elevated PSA    H/o this back 08/2016 - saw urology but did not f/u. With abnormal total/free PSA will refer back to urology for further evaluation. Patient agrees.       Hyperlipidemia, unspecified    Stable off medications. Declines cholesterol medication.  The 10-year ASCVD risk score Mikey Bussing DC Brooke Bonito., et al., 2013) is: 43.4%   Values used to calculate the score:     Age: 38 years     Sex: Male     Is Non-Hispanic African American: No     Diabetic: Yes     Tobacco smoker: Yes     Systolic Blood Pressure: 086 mmHg     Is BP treated: Yes     HDL Cholesterol: 31.3 mg/dL     Total Cholesterol: 136 mg/dL       Relevant Medications   furosemide (LASIX) 20 MG tablet   Iron deficiency anemia - Primary    Markedly worse anemia over last 2 months, unclear cause. No overt blood loss endorsed. Denies symptoms of anemia including dyspnea, chest pain, or dizziness so will hold off on blood transfusion but I will go ahead and start process for iron infusion.  Has appt Monday with GI. I discussed with patient - main concern we want to rule out is malignancy  related worsening microcytic anemia. If continues to decline endoscopic intervention, will likely order contrasted CT chest/abd/pelvis. Check UA today. Check CXR today.       Relevant Medications   ferrous sulfate 325 (65 FE) MG tablet   Other Relevant Orders   POCT Urinalysis Dipstick (Automated) (Completed)   Pharmacologic therapy (Chronic)    Saw Dr Dossie Arbour pain management 12/2017.      Tobacco abuse    Longstanding 1 ppd. Precontemplative. Update CXR today.       Relevant Orders   DG Chest 2 View   Vitamin B12 deficiency    Frankly deficient on recheck this month. Undergoing replacement with 2000 mcg daily.       Vitamin D insufficiency    Undergoing replacement through pain management.      Weight loss    Weight up 24 lbs since last seen here. Pt endorses appetite has improved, feeling better over last few months since metformin stopped.           Follow up plan: Return if symptoms worsen or fail to improve.  Ria Bush, MD

## 2017-12-18 NOTE — Patient Instructions (Addendum)
Urinalysis today.  Chest xray today Keep appointment with Dr Fuller Plan next week. You have worsening anemia of unclear cause - as well as low iron and low vitamin B12. I'm glad you've started daily vitamin B12 (cyanocobalamin). Start iron tablet (ferrous sulfate 325mg ) twice daily with meals, watch for constipation with this.  We will schedule you for iron infusion over the next few weeks. We will refer you back to urology (Dr Louis Meckel) for persistently elevated prostate levels.  Please let me know right away if any dizziness or shortness of breath to order blood transfusion for worsening anemia.

## 2017-12-18 NOTE — Assessment & Plan Note (Signed)
Undergoing replacement through pain management.

## 2017-12-18 NOTE — Assessment & Plan Note (Signed)
Frankly deficient on recheck this month. Undergoing replacement with 2000 mcg daily.

## 2017-12-18 NOTE — Assessment & Plan Note (Addendum)
Markedly worse anemia over last 2 months, unclear cause. No overt blood loss endorsed. Denies symptoms of anemia including dyspnea, chest pain, or dizziness so will hold off on blood transfusion but I will go ahead and start process for iron infusion.  Has appt Monday with GI. I discussed with patient - main concern we want to rule out is malignancy related worsening microcytic anemia. If continues to decline endoscopic intervention, will likely order contrasted CT chest/abd/pelvis. Check UA today. Check CXR today.

## 2017-12-18 NOTE — Assessment & Plan Note (Addendum)
H/o this back 08/2016 - saw urology but did not f/u. With abnormal total/free PSA will refer back to urology for further evaluation. Patient agrees.

## 2017-12-18 NOTE — Assessment & Plan Note (Signed)
Saw Dr Dossie Arbour pain management 12/2017.

## 2017-12-18 NOTE — Assessment & Plan Note (Signed)
Endorses this has resolved with discontinuation of metformin.

## 2017-12-18 NOTE — Assessment & Plan Note (Signed)
Weight up 24 lbs since last seen here. Pt endorses appetite has improved, feeling better over last few months since metformin stopped.

## 2017-12-18 NOTE — Assessment & Plan Note (Signed)
Stable off medications. Declines cholesterol medication.  The 10-year ASCVD risk score Mikey Bussing DC Brooke Bonito., et al., 2013) is: 43.4%   Values used to calculate the score:     Age: 72 years     Sex: Male     Is Non-Hispanic African American: No     Diabetic: Yes     Tobacco smoker: Yes     Systolic Blood Pressure: 177 mmHg     Is BP treated: Yes     HDL Cholesterol: 31.3 mg/dL     Total Cholesterol: 136 mg/dL

## 2017-12-18 NOTE — Assessment & Plan Note (Signed)
Longstanding 1 ppd. Precontemplative. Update CXR today.

## 2017-12-18 NOTE — Assessment & Plan Note (Signed)
Appreciate cards care. 

## 2017-12-21 ENCOUNTER — Other Ambulatory Visit: Payer: Self-pay

## 2017-12-21 ENCOUNTER — Ambulatory Visit (INDEPENDENT_AMBULATORY_CARE_PROVIDER_SITE_OTHER): Payer: Medicare Other | Admitting: Gastroenterology

## 2017-12-21 ENCOUNTER — Encounter: Payer: Self-pay | Admitting: Gastroenterology

## 2017-12-21 ENCOUNTER — Other Ambulatory Visit (INDEPENDENT_AMBULATORY_CARE_PROVIDER_SITE_OTHER): Payer: Medicare Other

## 2017-12-21 VITALS — BP 118/72 | HR 66 | Ht 69.0 in | Wt 225.5 lb

## 2017-12-21 DIAGNOSIS — K219 Gastro-esophageal reflux disease without esophagitis: Secondary | ICD-10-CM | POA: Diagnosis not present

## 2017-12-21 DIAGNOSIS — Z7901 Long term (current) use of anticoagulants: Secondary | ICD-10-CM

## 2017-12-21 DIAGNOSIS — D509 Iron deficiency anemia, unspecified: Secondary | ICD-10-CM | POA: Diagnosis not present

## 2017-12-21 DIAGNOSIS — K760 Fatty (change of) liver, not elsewhere classified: Secondary | ICD-10-CM | POA: Diagnosis not present

## 2017-12-21 LAB — BASIC METABOLIC PANEL
BUN: 18 mg/dL (ref 6–23)
CO2: 29 meq/L (ref 19–32)
CREATININE: 1.08 mg/dL (ref 0.40–1.50)
Calcium: 8.5 mg/dL (ref 8.4–10.5)
Chloride: 102 mEq/L (ref 96–112)
GFR: 71.11 mL/min (ref >60.00)
GLUCOSE: 199 mg/dL — AB (ref 70–99)
Potassium: 3.9 mEq/L (ref 3.5–5.1)
Sodium: 137 mEq/L (ref 135–145)

## 2017-12-21 LAB — IGA: IgA: 464 mg/dL — ABNORMAL HIGH (ref 68–378)

## 2017-12-21 NOTE — Progress Notes (Signed)
History of Present Illness: This is a 72 year old male referred by Ria Bush, MD for the evaluation of iron deficiency anemia and diarrhea. Hb=10.7, MCV=69.9, low iron saturation, low ferritin.  Diarrhea resolved after discontinuing metformin.  He has mild constipation occasionally and takes MiraLAX as needed. He has multiple comorbidities including CHF, COPD, OSA, bradycardia, afib on Coumadin.  He was recommended to have pacemaker placement for bradycardia he has declined.  He is declined sedation on several occasions.  I previously evaluated him in 2014 for Hemoccult positive stool, intermittent small volume hematochezia and intermittent diarrhea.  Colonoscopy was recommended and he declined.  He agreed to a barium enema outlined below. Denies weight loss, abdominal pain, constipation, diarrhea, change in stool caliber, melena, hematochezia, nausea, vomiting, dysphagia, reflux symptoms, chest pain.  ACBE 04/2013 IMPRESSION: 1.  Evaluation of the sigmoid colon is limited due to tortuosity and overlap. 2.  No definite polyp, mass or stricture.   Allergies  Allergen Reactions  . Metformin And Related Diarrhea  . Varenicline Tartrate Other (See Comments)    REACTION: hallucinations, but on retrial did well  . Wellbutrin [Bupropion Hcl] Other (See Comments)    Hallucinations  . Zocor [Simvastatin] Other (See Comments)    Muscle pain   Outpatient Medications Prior to Visit  Medication Sig Dispense Refill  . amiodarone (PACERONE) 200 MG tablet Take 1 tablet (200 mg total) by mouth 3 (three) times daily. 90 tablet 6  . Cholecalciferol 5000 units capsule Take 1 capsule (5,000 Units total) by mouth daily. 90 capsule 0  . Coenzyme Q10 100 MG capsule Take 1 capsule (100 mg total) by mouth daily. 30 capsule 0  . cyanocobalamin ([employer]S VITAMIN B12) 2000 MCG tablet Take 1 tablet (2,000 mcg total) by mouth daily. 90 tablet 0  . diltiazem (CARDIZEM) 30 MG tablet TK 1 T PO QD UTD PRF FAST HEART  RATE  3  . ergocalciferol (VITAMIN D2) 50000 units capsule Take 1 capsule (50,000 Units total) by mouth 2 (two) times a week. X 6 weeks. 12 capsule 0  . ferrous sulfate 325 (65 FE) MG tablet Take 1 tablet (325 mg total) by mouth 2 (two) times daily with a meal.    . fish oil-omega-3 fatty acids 1000 MG capsule Take 2 g by mouth daily.     . furosemide (LASIX) 20 MG tablet Take 1 tablet (20 mg total) by mouth daily as needed for edema.    . gabapentin (NEURONTIN) 600 MG tablet 600 mg 2 (two) times daily.     Marland Kitchen oxyCODONE (OXY IR/ROXICODONE) 5 MG immediate release tablet Take 1 tablet (5 mg total) by mouth every 6 (six) hours as needed for severe pain. 120 tablet 0  . PROAIR HFA 108 (90 Base) MCG/ACT inhaler INHALE 2 PUFFS BY MOUTH EVERY 6 HOURS AS NEEDED FOR WHEEZING OR SHORTNESS OF BREATH 17 g 5  . warfarin (COUMADIN) 5 MG tablet TAKE AS DIRECTED BY COUMADIN CLINIC 100 tablet 0  . Calcium Carb-Cholecalciferol (CALCIUM PLUS D3 ABSORBABLE) [phone] MG-UNIT CAPS Take 1 capsule by mouth daily with breakfast. 90 capsule 0  . Magnesium Oxide 500 MG CAPS Take 1 capsule (500 mg total) by mouth 2 (two) times daily at 8 am and 10 pm. 60 capsule 2   No facility-administered medications prior to visit.    Past Medical History:  Diagnosis Date  . AAA (abdominal aortic aneurysm) ([place]) 2013   s/p stent graft repair now with supra/pararenal aneurysm 3.5cm, referred to  [facility]. Sammuel Hines [facility] for endovascular repair (12/2013)  . Abnormal drug screen 06/2015   see problem list  . Cervical neck pain with evidence of disc disease 07/2011   MRI - disk bulging and foraminal stenosis, advanced at C4/5, 5/6; rec pain management for ESI by Dr. Mack Guise   . CHF (congestive heart failure) ([place])   . COPD (chronic obstructive pulmonary disease) ([facility])    mod-severe COPD/emphysema.  PFTs 12/2010.  He still smokes 1 ppd.  . Depression   . ED (erectile dysfunction) 02/2012   penile injections - failed viagra, poor arterial flow  ([place])  . Fatty liver   . GERD (gastroesophageal reflux disease)   . Hyperlipidemia    myalgias with simvastatin and atorvastatin  . Leg cramps    idiopathic severe  . Muscle spasm    chronic  . Neuralgia    pain in hands. L>R from accident  . Obesity   . OSA (obstructive sleep apnea) 03/2012   AHI 18.6, desat to 74%, severe snoring, consider ENT eval  . Osteoarthritis   . Paroxysmal atrial fibrillation ([facility])    on coumadin only.  . Peripheral autonomic neuropathy due to diabetes mellitus ([place])   . Right shoulder injury 05/2012   after fall out of chair, s/p injection, rec conservative management with PT Noemi Chapel)  . Smoker    1ppd  . T2DM (type 2 diabetes mellitus) ([place])    Past Surgical History:  Procedure Laterality Date  . CHOLECYSTECTOMY  2001  . CTA abd  09/2011   6.1cm AAA, bilateral ing hernias, R with bladder wall, promient prostate calcifications  . ENDOVASCULAR STENT INSERTION  11/11/2011   Procedure: ENDOVASCULAR STENT GRAFT INSERTION;  Surgeon: Angelia Mould, MD;  Location: [facility];  Service: Vascular;  Laterality: N/A;  aorta bi iliac  . KNEE SURGERY     L side cartilage taken out  . PFTs  12/2010   mod-severe obstruction, ?bronchodilator response  . TONSILLECTOMY     Social History   Socioeconomic History  . Marital status: Married    Spouse name: Juliann Pulse  . Number of children: 3  . Years of education: 12th gr  . Highest education level: None  Social Needs  . Financial resource strain: None  . Food insecurity - worry: None  . Food insecurity - inability: None  . Transportation needs - medical: None  . Transportation needs - non-medical: None  Occupational History  . Occupation: truck Education administrator: [employer]    Comment: for years  . Occupation: Corporate treasurer: [employer]    Comment: runs this  Tobacco Use  . Smoking status: Current Every Day Smoker    Packs/day: 1.50    Years: 52.00    Pack years: 78.00    Types:  Cigarettes  . Smokeless tobacco: Never Used  Substance and Sexual Activity  . Alcohol use: Yes    Alcohol/week: 4.2 oz    Types: 7 Glasses of wine per week  . Drug use: No  . Sexual activity: None  Other Topics Concern  . None  Social History Narrative   Caffeine: 1 cup coffee, 1/2 gallon unsweet tea, 2 soda/day   Lives with wife and 1 dog, 6 cats outside.   Occ: Long distance truck driver   Started seeking care when received medicare.   H/o noncompliance   Family History  Problem Relation Age of Onset  . Heart disease Father   . Leukemia Father   .  Coronary artery disease Father   . Melanoma Sister   . Arthritis Mother   . Diabetes Neg Hx   . Stroke Neg Hx       Review of Systems: Pertinent positive and negative review of systems were noted in the above HPI section. All other review of systems were otherwise negative.    Physical Exam: General: Well developed, well nourished, argumentative, no acute distress Head: Normocephalic and atraumatic Eyes:  sclerae anicteric, EOMI Ears: Normal auditory acuity Mouth: No deformity or lesions Neck: Supple, no masses or thyromegaly Lungs: Decreased BS throughout  Heart: Distant HS. Regular rate and rhythm; no murmurs, rubs or bruits Abdomen: Soft, non tender and non distended. No masses, hepatosplenomegaly or hernias noted. Normal Bowel sounds Rectal: Pt declined Musculoskeletal: Symmetrical with no gross deformities  Skin: No lesions on visible extremities Pulses:  Normal pulses noted Extremities: No clubbing, cyanosis, edema or deformities noted Neurological: Alert oriented x 4, grossly nonfocal Cervical Nodes:  No significant cervical adenopathy Inguinal Nodes: No significant inguinal adenopathy Psychological:  Alert and cooperative. Normal mood and affect  Assessment and Recommendations:  1. Iron deficiency anemia. R/O occult GI losses from an occult neoplasm, AVM, ulcer or other disorders.  Awaiting FOBT.  Patient  declines colonoscopy and upper endoscopy as recommended as he will not agree to sedation.  He states he had difficulty recovering from a sedation many years ago and he has routinely declined sedation since then.  He declines barium enema due to the need for a bowel prep.  He agrees to an abdominal/pelvic CT. He agrees to [employer], tTG, IgA today.  He agrees to take iron twice daily for an initial 2-3 month course supervised by his PCP. If he were to consent to a procedure or sedation further cardiac clearance would be required due his bradycardia.   2.  Diarrhea, resolved off metformin.  Occasional constipation.  Continue MiraLAX daily as needed.  3. Afib with bradycardia on Coumadin.  4. CHF.  5. OSA,  6. COPD.   7. Fatty liver.  Continue long-term weight loss program with a fat and carb modified diet supervised by his PCP.  8. GERD.    cc: Ria Bush, MD [address] [place], [place] [address]

## 2017-12-21 NOTE — Patient Instructions (Signed)
Your physician has requested that you go to the basement for the following lab work before leaving today: Bmet.  You have been scheduled for a CT scan of the abdomen and pelvis at [facility] ([address]---this is in the same building as [facility]).   You are scheduled on 12/24/17 at 2:30pm. You should arrive 15 minutes prior to your appointment time for registration. Please follow the written instructions below on the day of your exam:  WARNING: IF YOU ARE ALLERGIC TO IODINE/X-RAY DYE, PLEASE NOTIFY RADIOLOGY IMMEDIATELY AT [phone]! YOU WILL BE GIVEN A 13 HOUR PREMEDICATION PREP.  1) Do not eat or drink anything after 10:30am (4 hours prior to your test) 2) You have been given 2 bottles of oral contrast to drink. The solution may taste               better if refrigerated, but do NOT add ice or any other liquid to this solution. Shake             well before drinking.    Drink 1 bottle of contrast @ 12:30pm (2 hours prior to your exam)  Drink 1 bottle of contrast @ 1:30pm (1 hour prior to your exam)  You may take any medications as prescribed with a small amount of water except for the following: Metformin, Glucophage, Glucovance, Avandamet, Riomet, Fortamet, Actoplus Met, Janumet, Glumetza or Metaglip. The above medications must be held the day of the exam AND 48 hours after the exam.  The purpose of you drinking the oral contrast is to aid in the visualization of your intestinal tract. The contrast solution may cause some diarrhea. Before your exam is started, you will be given a small amount of fluid to drink. Depending on your individual set of symptoms, you may also receive an intravenous injection of x-ray contrast/dye. Plan on being at [facility] for 30 minutes or longer, depending on the type of exam you are having performed.  This test typically takes 30-45 minutes to complete.  If you have any questions regarding your exam or if you need to  reschedule, you may call the CT department at [phone] between the hours of 8:00 am and 5:00 pm, Monday-Friday.  ________________________________________________________________________  Thank you for choosing me and [facility].  Malcolm T. Stark, Jr., MD., FACG    

## 2017-12-22 ENCOUNTER — Ambulatory Visit: Payer: Medicare Other

## 2017-12-22 LAB — TISSUE TRANSGLUTAMINASE, IGA: (tTG) Ab, IgA: 1 U/mL

## 2017-12-23 ENCOUNTER — Inpatient Hospital Stay: Admission: RE | Admit: 2017-12-23 | Payer: Medicare Other | Source: Ambulatory Visit

## 2017-12-24 ENCOUNTER — Inpatient Hospital Stay: Admission: RE | Admit: 2017-12-24 | Payer: Medicare Other | Source: Ambulatory Visit

## 2017-12-24 ENCOUNTER — Other Ambulatory Visit: Payer: Self-pay | Admitting: Family Medicine

## 2017-12-30 ENCOUNTER — Other Ambulatory Visit: Payer: Self-pay | Admitting: Family Medicine

## 2017-12-30 NOTE — Telephone Encounter (Signed)
Last filled:  10/02/17, #180 Last OV:   12/18/17 Next OV:  01/06/18

## 2017-12-31 ENCOUNTER — Other Ambulatory Visit: Payer: Self-pay | Admitting: Family Medicine

## 2018-01-04 ENCOUNTER — Encounter: Payer: Self-pay | Admitting: Pain Medicine

## 2018-01-04 ENCOUNTER — Ambulatory Visit: Payer: Medicare Other | Attending: Pain Medicine | Admitting: Pain Medicine

## 2018-01-04 ENCOUNTER — Ambulatory Visit (INDEPENDENT_AMBULATORY_CARE_PROVIDER_SITE_OTHER): Payer: Medicare Other

## 2018-01-04 ENCOUNTER — Other Ambulatory Visit: Payer: Self-pay

## 2018-01-04 VITALS — BP 97/43 | HR 54 | Temp 98.1°F | Resp 16 | Ht 69.0 in | Wt 229.0 lb

## 2018-01-04 DIAGNOSIS — R252 Cramp and spasm: Secondary | ICD-10-CM | POA: Diagnosis not present

## 2018-01-04 DIAGNOSIS — I110 Hypertensive heart disease with heart failure: Secondary | ICD-10-CM | POA: Diagnosis not present

## 2018-01-04 DIAGNOSIS — R7982 Elevated C-reactive protein (CRP): Secondary | ICD-10-CM | POA: Diagnosis not present

## 2018-01-04 DIAGNOSIS — N400 Enlarged prostate without lower urinary tract symptoms: Secondary | ICD-10-CM | POA: Insufficient documentation

## 2018-01-04 DIAGNOSIS — K219 Gastro-esophageal reflux disease without esophagitis: Secondary | ICD-10-CM | POA: Diagnosis not present

## 2018-01-04 DIAGNOSIS — G4700 Insomnia, unspecified: Secondary | ICD-10-CM | POA: Diagnosis not present

## 2018-01-04 DIAGNOSIS — Z5181 Encounter for therapeutic drug level monitoring: Secondary | ICD-10-CM

## 2018-01-04 DIAGNOSIS — E559 Vitamin D deficiency, unspecified: Secondary | ICD-10-CM | POA: Insufficient documentation

## 2018-01-04 DIAGNOSIS — I4891 Unspecified atrial fibrillation: Secondary | ICD-10-CM | POA: Insufficient documentation

## 2018-01-04 DIAGNOSIS — L0201 Cutaneous abscess of face: Secondary | ICD-10-CM | POA: Insufficient documentation

## 2018-01-04 DIAGNOSIS — G8929 Other chronic pain: Secondary | ICD-10-CM

## 2018-01-04 DIAGNOSIS — M5030 Other cervical disc degeneration, unspecified cervical region: Secondary | ICD-10-CM | POA: Insufficient documentation

## 2018-01-04 DIAGNOSIS — I714 Abdominal aortic aneurysm, without rupture: Secondary | ICD-10-CM | POA: Diagnosis not present

## 2018-01-04 DIAGNOSIS — E1140 Type 2 diabetes mellitus with diabetic neuropathy, unspecified: Secondary | ICD-10-CM | POA: Insufficient documentation

## 2018-01-04 DIAGNOSIS — I495 Sick sinus syndrome: Secondary | ICD-10-CM | POA: Insufficient documentation

## 2018-01-04 DIAGNOSIS — G894 Chronic pain syndrome: Secondary | ICD-10-CM | POA: Insufficient documentation

## 2018-01-04 DIAGNOSIS — M25512 Pain in left shoulder: Secondary | ICD-10-CM

## 2018-01-04 DIAGNOSIS — M7032 Other bursitis of elbow, left elbow: Secondary | ICD-10-CM | POA: Diagnosis not present

## 2018-01-04 DIAGNOSIS — G4733 Obstructive sleep apnea (adult) (pediatric): Secondary | ICD-10-CM | POA: Insufficient documentation

## 2018-01-04 DIAGNOSIS — M7521 Bicipital tendinitis, right shoulder: Secondary | ICD-10-CM | POA: Insufficient documentation

## 2018-01-04 DIAGNOSIS — F17210 Nicotine dependence, cigarettes, uncomplicated: Secondary | ICD-10-CM | POA: Insufficient documentation

## 2018-01-04 DIAGNOSIS — Z7901 Long term (current) use of anticoagulants: Secondary | ICD-10-CM | POA: Diagnosis not present

## 2018-01-04 DIAGNOSIS — I509 Heart failure, unspecified: Secondary | ICD-10-CM | POA: Diagnosis not present

## 2018-01-04 DIAGNOSIS — Z7984 Long term (current) use of oral hypoglycemic drugs: Secondary | ICD-10-CM | POA: Insufficient documentation

## 2018-01-04 DIAGNOSIS — Z79899 Other long term (current) drug therapy: Secondary | ICD-10-CM | POA: Insufficient documentation

## 2018-01-04 DIAGNOSIS — E669 Obesity, unspecified: Secondary | ICD-10-CM | POA: Insufficient documentation

## 2018-01-04 DIAGNOSIS — N529 Male erectile dysfunction, unspecified: Secondary | ICD-10-CM | POA: Insufficient documentation

## 2018-01-04 DIAGNOSIS — M25511 Pain in right shoulder: Secondary | ICD-10-CM | POA: Diagnosis not present

## 2018-01-04 DIAGNOSIS — K760 Fatty (change of) liver, not elsewhere classified: Secondary | ICD-10-CM | POA: Insufficient documentation

## 2018-01-04 DIAGNOSIS — J449 Chronic obstructive pulmonary disease, unspecified: Secondary | ICD-10-CM | POA: Insufficient documentation

## 2018-01-04 DIAGNOSIS — R700 Elevated erythrocyte sedimentation rate: Secondary | ICD-10-CM | POA: Insufficient documentation

## 2018-01-04 DIAGNOSIS — D509 Iron deficiency anemia, unspecified: Secondary | ICD-10-CM | POA: Insufficient documentation

## 2018-01-04 DIAGNOSIS — M47812 Spondylosis without myelopathy or radiculopathy, cervical region: Secondary | ICD-10-CM | POA: Insufficient documentation

## 2018-01-04 DIAGNOSIS — E785 Hyperlipidemia, unspecified: Secondary | ICD-10-CM | POA: Diagnosis not present

## 2018-01-04 DIAGNOSIS — M4802 Spinal stenosis, cervical region: Secondary | ICD-10-CM | POA: Insufficient documentation

## 2018-01-04 LAB — POCT INR: INR: 1.9

## 2018-01-04 MED ORDER — OXYCODONE HCL 5 MG PO TABS
5.0000 mg | ORAL_TABLET | Freq: Four times a day (QID) | ORAL | 0 refills | Status: DC | PRN
Start: 2018-01-15 — End: 2018-01-04

## 2018-01-04 MED ORDER — HYDROCODONE-ACETAMINOPHEN 10-325 MG PO TABS: 1.0000 | ORAL_TABLET | Freq: Three times a day (TID) | ORAL | 0 refills | Status: DC | PRN

## 2018-01-04 NOTE — Progress Notes (Signed)
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 palpation              Palpation: No palpable anomalies              Specialized Test(s): Deferred         Specialized Test(s): Deferred          Thoracic Spine Area Exam  Skin & Axial Inspection: No masses, redness, or swelling Alignment: Symmetrical Functional ROM: Unrestricted ROM Stability: No instability detected Muscle Tone/Strength: Functionally intact. No obvious neuro-muscular anomalies detected. Sensory (Neurological): Unimpaired Muscle strength & Tone: No palpable anomalies  Lumbar Spine Area Exam  Skin & Axial Inspection: No masses, redness, or swelling Alignment: Symmetrical Functional ROM: Unrestricted ROM      Stability: No instability detected Muscle Tone/Strength: Functionally intact. No obvious neuro-muscular anomalies detected. Sensory (Neurological): Unimpaired Palpation: No palpable anomalies       Provocative Tests: Lumbar Hyperextension and rotation test: evaluation deferred today       Lumbar Lateral bending test: evaluation deferred today       Patrick's  Maneuver: evaluation deferred today                    Gait & Posture Assessment  Ambulation: Unassisted Gait: Relatively normal for age and body habitus Posture: WNL   Lower Extremity Exam    Side: Right lower extremity  Side: Left lower extremity  Skin & Extremity Inspection: Skin color, temperature, and hair growth are WNL. No peripheral edema or cyanosis. No masses, redness, swelling, asymmetry, or associated skin lesions. No contractures.  Skin & Extremity Inspection: Skin color, temperature, and hair growth are WNL. No peripheral edema or cyanosis. No masses, redness, swelling, asymmetry, or associated skin lesions. No contractures.  Functional ROM: Unrestricted ROM          Functional ROM: Unrestricted ROM          Muscle Tone/Strength: Functionally intact. No obvious neuro-muscular anomalies detected.  Muscle Tone/Strength: Functionally intact. No obvious neuro-muscular anomalies detected.  Sensory (Neurological): Unimpaired  Sensory (Neurological): Unimpaired  Palpation: No palpable anomalies  Palpation: No palpable anomalies   Assessment  Primary Diagnosis & Pertinent Problem List: The primary encounter diagnosis was Biceps tendinitis of right shoulder. Diagnoses of Chronic shoulder pain (Primary Area of Pain) (Bilateral) (R>L), Chronic pain syndrome, Pharmacologic therapy, Current use of long term anticoagulation (Coumadin), Elevated sed rate, and Elevated C-reactive protein (CRP) were also pertinent to this visit.  Status Diagnosis  Improved Improving Controlled 1. Biceps tendinitis of right shoulder   2. Chronic shoulder pain (Primary Area of Pain) (Bilateral) (R>L)   3. Chronic pain syndrome   4. Pharmacologic therapy   5. Current use of long term anticoagulation (Coumadin)   6. Elevated sed rate   7. Elevated C-reactive protein (CRP)     Problems updated and reviewed during  this visit: Problem  Elevated Sed Rate  Elevated C-Reactive Protein (Crp)   Plan of Care   Pharmacotherapy (Medications Ordered): Meds ordered this encounter  Medications  . DISCONTD: oxyCODONE (OXY IR/ROXICODONE) 5 MG immediate release tablet    Sig: Take 1 tablet (5 mg total) by mouth every 6 (six) hours as needed for severe pain.    Dispense:  120 tablet    Refill:  0    Do not place this medication, or any other prescription from our practice, on "Automatic Refill". Patient may have prescription filled one day early if pharmacy is closed on scheduled refill date. Do not fill until: 01/15/18 To last until: 02/14/18  . HYDROcodone-acetaminophen (NORCO) 10-325 MG tablet    Sig: Take 1 tablet by mouth every 8 (eight) hours as needed for severe pain.    Dispense:  90 tablet    Refill:  0    Do not place this medication, or any other prescription from our practice, on "Automatic Refill". Patient may have prescription filled one day early if pharmacy is closed on scheduled refill date. Do not fill until: 01/04/18 To last until: 02/03/18   Medications administered today: [name] had no medications administered during this visit.   Procedure Orders     SHOULDER INJECTION     SUPRASCAPULAR NERVE BLOCK  Lab Orders     Rheumatoid factor     ANA w/Reflex if Positive Imaging Orders  No imaging studies ordered today   Referral Orders  No referral(s) requested today    Interventional management options: Planned, scheduled, and/or pending:   Note: Stop Coumadin x 5 days, prior to procedures. Diagnostic right intra-articular shoulder joint injection + diagnostic right suprascapular nerve block    Considering:   Diagnostic right intra-articular shoulder injection  Diagnosticright suprascapular nerve block  Possible right suprascapular RFA  Diagnostic cervical epidural steroid injection  Diagnostic cervical facet block  Possible cervical facet RFA    Palliative PRN treatment(s):   None at this time   Provider-requested follow-up: Return for Procedure (w/  sedation): (R) IA Shoulder inj. + (R) Suprascapular NB, (Blood-thinner Protocol).  Future Appointments  Date Time Provider [place]  01/06/2018  4:00 PM Ria Bush, MD [facility]  02/01/2018  9:45 AM [facility]   Primary Care Physician: Ria Bush, MD Location: [facility] Out[facility] Pain Management Facility Note by: Gaspar Cola, MD Date: 01/04/2018; Time: 12:40 PM

## 2018-01-04 NOTE — Patient Instructions (Signed)
Please take 2 tablets today, then continue taking 1/2 tablet every day except 1 tablet on Mondays, Wednesdays and Fridays.    Recheck in 4 weeks.

## 2018-01-04 NOTE — Progress Notes (Signed)
Nursing Pain Medication Assessment:  Safety precautions to be maintained throughout the outpatient stay will include: orient to surroundings, keep bed in low position, maintain call bell within reach at all times, provide assistance with transfer out of bed and ambulation.  Medication Inspection Compliance: Pill count conducted under aseptic conditions, in front of the patient. Neither the pills nor the bottle was removed from the patient's sight at any time. Once count was completed pills were immediately returned to the patient in their original bottle.  Medication: See above Pill/Patch Count: 114 of 120 pills remain Pill/Patch Appearance: Markings consistent with prescribed medication Bottle Appearance: Standard pharmacy container. Clearly labeled. Filled Date: 1 / 33 / 2018 Last Medication intake:  Took 4 days, caused SOB   Wasted 114 pills, Oxycodone 5mg  witness by myself, Patient and DWheatley

## 2018-01-04 NOTE — Patient Instructions (Addendum)
____________________________________________________________________________________________  Preparing for Procedure with Sedation Instructions: . Oral Intake: Do not eat or drink anything for at least 8 hours prior to your procedure. . Transportation: Public transportation is not allowed. Bring an adult driver. The driver must be physically present in our waiting room before any procedure can be started. Marland Kitchen Physical Assistance: Bring an adult physically capable of assisting you, in the event you need help. This adult should keep you company at home for at least 6 hours after the procedure. . Blood Pressure Medicine: Take your blood pressure medicine with a sip of water the morning of the procedure. . Blood thinners:  . Diabetics on insulin: Notify the staff so that you can be scheduled 1st case in the morning. If your diabetes requires high dose insulin, take only  of your normal insulin dose the morning of the procedure and notify the staff that you have done so. . Preventing infections: Shower with an antibacterial soap the morning of your procedure. . Build-up your immune system: Take 1000 mg of Vitamin C with every meal (3 times a day) the day prior to your procedure. Marland Kitchen Antibiotics: Inform the staff if you have a condition or reason that requires you to take antibiotics before dental procedures. . Pregnancy: If you are pregnant, call and cancel the procedure. . Sickness: If you have a cold, fever, or any active infections, call and cancel the procedure. . Arrival: You must be in the facility at least 30 minutes prior to your scheduled procedure. . Children: Do not bring children with you. . Dress appropriately: Bring dark clothing that you would not mind if they get stained. . Valuables: Do not bring any jewelry or valuables. Procedure appointments are reserved for interventional treatments only. Marland Kitchen No Prescription Refills. . No medication changes will be discussed during procedure  appointments. . No disability issues will be discussed. ____________________________________________________________________________________________   Celiac Plexus Block Patient Information  Description: The celiac plexus is a group of nerves which are part of the sympathetic nervous system.  These nerves supply organs in the abdomen and pelvis.  Specific organs supplied with sensation by the celiac plexus include the stomach, liver, gallbladder, pancreas, kidneys and part of the gut.   The celiac plexus is located on both sides of the aorta at approximately the level of the first lumbar vertebral body.  The block will be performed with you lying on your abdomen with a pillow underneath.  Using direct x-ray guidance, the celiac plexus will be located on both sides of the spine.  Numbing medicine will be used to deaden the skin prior to needle insertion.  In most cases, a small amount of sedation can be given by IV prior to the numbing medicine.  Two small needles will be place near the celiac plexus and local anesthetic and steroid will be injected.  The entire block usually last about 15-25 minutes.  Conditions which may be treated by celiac plexus block:   Acute and chronic pancreatitis  Pain from liver or pancreatic cancer  Pain from Crohn's disease  Other types of abdominal or flank pain  Preparation for the injection:  1. Do not eat any solid food or dairy products within 8 hours of your appointment. 2. You may drink clear liquids up to 3 hours before appointment.  Clear liquids include water, black coffee, juice or soda.  No milk or cream please. 3. You may take your regular medication, including pain medications, with a sip of water before your appointment.  Diabetics should hold regular insulin (if taken separately) and take 1/2 normal NPH dose in the morning of the procedure.  Carry some sugar containing items with you to your appointment. 4. A driver must accompany you and be  prepared to drive you home after your procedure. 5. Bring all your current medications with you. 6. An IV may be inserted and sedation may be given at the discretion of the physician. 7. A blood pressure cuff, EKG, and other monitors will often be applied during the procedure.  Some patients may need to have extra oxygen administered for a short period. 8. You will be asked to provide medical information, including your allergies and medications, prior to the procedure.  We must know immediately if you are taking blood thinners (like Coumadin/Warfarin) or if you are allergic to IV iodine contrast (dye).  We must know if you could possible be pregnant.  Possible side-effects:   Bleeding from needle site or deeper  Infection (rarre, can require surgery)  Nerve injury (rare)  Numbness & tingling (temporary)  Collapsed lung (rare)  Spinal headache ( a headache worse with upright posture)  Light-headedness (temporary)  Pain at injection site (several days)  Decreased blood pressure (temporary)  Weakness in legs (temporary)  Seizure or other drug reaction (rare)  Call if you experience:   Fever/chills associated with headache or increased back/neck pain  Headache worsened by an upright position  New onset weakness or numbness of an extremity below the injection site.  Hives or difficulty breathing (go to the emergency room)  Inflammation or drainage at the injection site.  New onset diarrhea lasting more than 2 weeks.  New symptoms which are concerning to you  Please note:  If effective, we will often do a series of 2-3 injections spaced 3-6 weeks apart to maximally decrease your pain.  If initial series is effective, you may be a candidate for a more permanent block of the celiac plexus. .  If you have questions, please call [phone] [facility]

## 2018-01-05 LAB — ANA W/REFLEX IF POSITIVE: Anti Nuclear Antibody(ANA): NEGATIVE

## 2018-01-05 LAB — RHEUMATOID FACTOR: Rhuematoid fact SerPl-aCnc: <10 IU/mL (ref 0.0–13.9)

## 2018-01-06 ENCOUNTER — Encounter: Payer: Self-pay | Admitting: Family Medicine

## 2018-01-06 ENCOUNTER — Ambulatory Visit (INDEPENDENT_AMBULATORY_CARE_PROVIDER_SITE_OTHER): Payer: Medicare Other | Admitting: Family Medicine

## 2018-01-06 VITALS — BP 118/60 | HR 48 | Temp 98.2°F | Ht 69.0 in | Wt 227.0 lb

## 2018-01-06 DIAGNOSIS — Z7189 Other specified counseling: Secondary | ICD-10-CM

## 2018-01-06 DIAGNOSIS — D509 Iron deficiency anemia, unspecified: Secondary | ICD-10-CM

## 2018-01-06 DIAGNOSIS — J449 Chronic obstructive pulmonary disease, unspecified: Secondary | ICD-10-CM | POA: Diagnosis not present

## 2018-01-06 DIAGNOSIS — Z720 Tobacco use: Secondary | ICD-10-CM | POA: Diagnosis not present

## 2018-01-06 DIAGNOSIS — I495 Sick sinus syndrome: Secondary | ICD-10-CM

## 2018-01-06 DIAGNOSIS — I4891 Unspecified atrial fibrillation: Secondary | ICD-10-CM

## 2018-01-06 DIAGNOSIS — R972 Elevated prostate specific antigen [PSA]: Secondary | ICD-10-CM | POA: Diagnosis not present

## 2018-01-06 DIAGNOSIS — E1140 Type 2 diabetes mellitus with diabetic neuropathy, unspecified: Secondary | ICD-10-CM | POA: Diagnosis not present

## 2018-01-06 DIAGNOSIS — E1165 Type 2 diabetes mellitus with hyperglycemia: Secondary | ICD-10-CM

## 2018-01-06 DIAGNOSIS — IMO0002 Reserved for concepts with insufficient information to code with codable children: Secondary | ICD-10-CM

## 2018-01-06 DIAGNOSIS — R634 Abnormal weight loss: Secondary | ICD-10-CM

## 2018-01-06 DIAGNOSIS — E538 Deficiency of other specified B group vitamins: Secondary | ICD-10-CM | POA: Diagnosis not present

## 2018-01-06 NOTE — Patient Instructions (Addendum)
Keep working on your living will.  I will order lung CT along with your planned abdominal and pelvic CT - try to get done in the next 1-2 weeks. This is to try and find out why you have progressively worsening anemia. We set you up with urology again for elevated prostate blood test.  Return to see me in 1 month for follow up visit. We will recheck labs at follow up visit after 6 weeks of oral iron treatment.

## 2018-01-06 NOTE — Progress Notes (Signed)
BP 118/60 (BP Location: Left Arm, Patient Position: Sitting, Cuff Size: Normal)   Pulse (!) 48   Temp 98.2 F (36.8 C) (Oral)   Ht 5\' 9"  (1.753 m)   Wt 227 lb (103 kg)   SpO2 95%   BMI 33.52 kg/m    CC: f/u after AMW Subjective:    Patient ID: [name], male    DOB: [birth year], 72 y.o.   MRN: [id number]  HPI: GEDDY BOYDSTUN is a 72 y.o. male presenting on 01/06/2018 for Medicare Wellness   Saw Katha Cabal earlier this month for medicare wellness visit. Note reviewed.    IDA - pt has seen GI, has declined colonoscopy and EGD despite concern for IDA and occult bleed. Pt worried about prior anesthesia experience (difficulty coming out of sedation). He has declined barium enema but agreed to [facility] and abd/pelvic CT - this has not yet happened - pt cancelled appts "I don't have time for this". Plan was oral iron BID x 2-3 months. He is taking iron twice daily - this turned stools dark. I recommended iron infusion - this has not happened - he now declines and would rather try oral iron.   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 B12 deficiency - undergoing replacement with 2000 mcg orally daily.   Preventative: Colon cancer screening - see above.  Prostate cancer screening - PSA 9.9, free PSA 9. Saw urology Dr Avanell Shackleton 08/2016 - at that time plan was to rpt 3 months (around time of AAA repair) but seems was lost to f/u. Has been referred back to urology. Has not seen.  Lung cancer screening - see above - planned CT chest Flu shot - declines Tetanus - unsure Pneumonia shot - declines shingrix - declines Advanced directive discussion - working on this at home. Wife would be HCPOA.  Seat belt use discussed Sunscreen use discussed, no changing moles  Smoker - 1 ppd longterm smoker (52 yrs) Alcohol - none  Relevant past medical, surgical, family and social history reviewed and updated as indicated. Interim medical history since our last visit reviewed. Allergies  and medications reviewed and updated. Outpatient Medications Prior to Visit  Medication Sig Dispense Refill  . amiodarone (PACERONE) 200 MG tablet Take 1 tablet (200 mg total) by mouth 3 (three) times daily. 90 tablet 6  . Cholecalciferol 5000 units capsule Take 1 capsule (5,000 Units total) by mouth daily. 90 capsule 0  . Coenzyme Q10 100 MG capsule Take 1 capsule (100 mg total) by mouth daily. 30 capsule 0  . cyanocobalamin ([employer] VITAMIN B12) 2000 MCG tablet Take 1 tablet (2,000 mcg total) by mouth daily. 90 tablet 0  . ergocalciferol (VITAMIN D2) 50000 units capsule Take 1 capsule (50,000 Units total) by mouth 2 (two) times a week. X 6 weeks. 12 capsule 0  . ferrous sulfate 325 (65 FE) MG tablet Take 1 tablet (325 mg total) by mouth 2 (two) times daily with a meal.    . fish oil-omega-3 fatty acids 1000 MG capsule Take 2 g by mouth daily.     . furosemide (LASIX) 20 MG tablet Take 1 tablet (20 mg total) by mouth daily as needed for edema.    . gabapentin (NEURONTIN) 600 MG tablet 600 mg 2 (two) times daily.     Marland Kitchen HYDROcodone-acetaminophen (NORCO) 10-325 MG tablet Take 1 tablet by mouth every 8 (eight) hours as needed for severe pain. 90 tablet 0  . omeprazole (PRILOSEC) 40 MG  capsule TAKE 1 CAPSULE BY MOUTH EVERY DAY 90 capsule 0  . PROAIR HFA 108 (90 Base) MCG/ACT inhaler INHALE 2 PUFFS BY MOUTH EVERY 6 HOURS AS NEEDED FOR WHEEZING OR SHORTNESS OF BREATH 17 g 5  . tiZANidine (ZANAFLEX) 4 MG tablet TAKE 1 TABLET BY MOUTH TWICE DAILY AS NEEDED FOR MUSCLE SPASMS 180 tablet 0  . warfarin (COUMADIN) 5 MG tablet TAKE AS DIRECTED BY COUMADIN CLINIC 100 tablet 0  . diltiazem (CARDIZEM) 30 MG tablet TK 1 T PO QD UTD PRF FAST HEART RATE  3   No facility-administered medications prior to visit.      Per HPI unless specifically indicated in ROS section below Review of Systems     Objective:    BP 118/60 (BP Location: Left Arm, Patient Position: Sitting, Cuff Size: Normal)   Pulse (!) 48   Temp  98.2 F (36.8 C) (Oral)   Ht 5\' 9"  (1.753 m)   Wt 227 lb (103 kg)   SpO2 95%   BMI 33.52 kg/m   Wt Readings from Last 3 Encounters:  01/06/18 227 lb (103 kg)  01/04/18 229 lb (103.9 kg)  12/21/17 225 lb 8 oz (102.3 kg)    Physical Exam  Constitutional: He appears well-developed and well-nourished. No distress.  HENT:  Mouth/Throat: Oropharynx is clear and moist. No oropharyngeal exudate.  Eyes: Conjunctivae are normal. Pupils are equal, round, and reactive to light.  Neck: Normal range of motion. Neck supple. No thyromegaly present.  Cardiovascular: Regular rhythm, normal heart sounds and intact distal pulses. Bradycardia present.  No murmur heard. Pulmonary/Chest: Effort normal and breath sounds normal. No respiratory distress. He has no wheezes. He has no rales.  Musculoskeletal: He exhibits no edema.  Skin: Skin is warm and dry. No rash noted.  Psychiatric: He has a normal mood and affect.  Nursing note and vitals reviewed.  Results for orders placed or performed in visit on 01/04/18  POCT INR  Result Value Ref Range   INR 1.9    Lab Results  Component Value Date   HGBA1C 7.1 (H) 10/09/2017    Lab Results  Component Value Date   WBC 11.0 (H) 12/14/2017   HGB 8.5 Repeated and verified X2. (L) 12/14/2017   HCT 28.9 (L) 12/14/2017   MCV 69.4 (L) 12/14/2017   PLT 310.0 12/14/2017    Lab Results  Component Value Date   CREATININE 1.08 12/21/2017       Assessment & Plan:   Problem List Items Addressed This Visit    Advanced care planning/counseling discussion    Advanced directive discussion - working on this at home. Wife would be HCPOA.       Atrial fibrillation (HCC)    Controlled on amiodarone, on chronic coumadin. Has declined EP intervention. Aware of need to monitor thyroid and lung function.       COPD mixed type [facility])    Not on controller medication. Only uses albuterol PRN Endorses intermittent dyspnea more noticeable with elevated heart rate >80.  Will check CT chest to further eval dyspnea.       Relevant Orders   CT Chest Wo Contrast   Elevated PSA    Elevated PSA with low free PSA %. He states he decided not to f/u with urology. Again reviewed reasons to r/o cancer - he agrees to proceed with referral.       Relevant Orders   Ambulatory referral to Urology   Iron deficiency anemia - Primary  Again reviewed concerns with worsening IDA and need to r/o occult malignancy - he agrees to undergo abd/pelvic CT scan as he has declined direct visualization procedures.       Relevant Orders   CT Chest Wo Contrast   Ambulatory referral to Urology   Sick sinus syndrome [facility])    Has declined EP intervention.      Tobacco abuse    Longstanding 1 ppd. Not interested in cessation.      Relevant Orders   CT Chest Wo Contrast   Type 2 diabetes, uncontrolled, with neuropathy ([place])    Improved control on last check.      Vitamin B12 deficiency    Continue oral b12 2000 mcg daily      Weight loss    This has stabilized.       Relevant Orders   CT Chest Wo Contrast       Follow up plan: Return in about 4 weeks (around 02/03/2018) for follow up visit.  Ria Bush, MD

## 2018-01-09 DIAGNOSIS — Z7189 Other specified counseling: Secondary | ICD-10-CM | POA: Insufficient documentation

## 2018-01-09 NOTE — Assessment & Plan Note (Addendum)
Not on controller medication. Only uses albuterol PRN Endorses intermittent dyspnea more noticeable with elevated heart rate >80. Will check CT chest to further eval dyspnea.

## 2018-01-09 NOTE — Assessment & Plan Note (Signed)
This has stabilized.  

## 2018-01-09 NOTE — Assessment & Plan Note (Signed)
Longstanding 1 ppd. Not interested in cessation.

## 2018-01-09 NOTE — Assessment & Plan Note (Signed)
Again reviewed concerns with worsening IDA and need to r/o occult malignancy - he agrees to undergo abd/pelvic CT scan as he has declined direct visualization procedures.

## 2018-01-09 NOTE — Assessment & Plan Note (Signed)
Improved control on last check.

## 2018-01-09 NOTE — Assessment & Plan Note (Signed)
Elevated PSA with low free PSA %. He states he decided not to f/u with urology. Again reviewed reasons to r/o cancer - he agrees to proceed with referral.

## 2018-01-09 NOTE — Assessment & Plan Note (Signed)
Has declined EP intervention.

## 2018-01-09 NOTE — Assessment & Plan Note (Signed)
Controlled on amiodarone, on chronic coumadin. Has declined EP intervention. Aware of need to monitor thyroid and lung function.

## 2018-01-09 NOTE — Assessment & Plan Note (Signed)
Advanced directive discussion - working on this at home. Wife would be HCPOA.

## 2018-01-09 NOTE — Assessment & Plan Note (Signed)
Continue oral b12 2000 mcg daily

## 2018-01-11 ENCOUNTER — Telehealth: Payer: Self-pay

## 2018-01-11 NOTE — Telephone Encounter (Signed)
Could not determine who called pt.

## 2018-01-11 NOTE — Telephone Encounter (Signed)
Copied from [place] [id number]. Topic: General - Call Back - No Documentation >> Jan 11, 2018 12:02 PM Marin Olp L wrote: Reason for CRM: Patient missed a call from office and would like whomever was calling to call his wife [name] back RE whatever it was the office is needing. I've listed her contact info.

## 2018-01-20 DIAGNOSIS — H25093 Other age-related incipient cataract, bilateral: Secondary | ICD-10-CM | POA: Diagnosis not present

## 2018-01-21 ENCOUNTER — Telehealth: Payer: Self-pay | Admitting: Family Medicine

## 2018-01-21 NOTE — Telephone Encounter (Signed)
Dr Danise Mina is out of office this afternoon but will return 01/22/18 in AM.

## 2018-01-21 NOTE — Telephone Encounter (Signed)
Copied from [place]. Topic: General - Other >> Jan 21, 2018  4:12 PM Cecelia Byars, NT wrote: Reason for CRM: Wells Guiles from [facility] would like the ok to change the ct test without  contrast to a a ct test with contrast they do one injection and then scan all three parts please call her at [phone] she is there until 530 , she can change the epic order if approved ,no new authorization is required , the patients appointment is at 1140 on 01/22/18

## 2018-01-22 ENCOUNTER — Ambulatory Visit
Admission: RE | Admit: 2018-01-22 | Discharge: 2018-01-22 | Disposition: A | Discharge status: Home / Self Care | Payer: Medicare Other | Source: Ambulatory Visit | Attending: Pain Medicine | Admitting: Pain Medicine

## 2018-01-22 ENCOUNTER — Ambulatory Visit
Admission: RE | Admit: 2018-01-22 | Discharge: 2018-01-22 | Disposition: A | Discharge status: Home / Self Care | Payer: Medicare Other | Source: Ambulatory Visit | Attending: Gastroenterology | Admitting: Gastroenterology

## 2018-01-22 ENCOUNTER — Ambulatory Visit
Admission: RE | Admit: 2018-01-22 | Discharge: 2018-01-22 | Disposition: A | Discharge status: Home / Self Care | Payer: Medicare Other | Source: Ambulatory Visit | Attending: Family Medicine | Admitting: Family Medicine

## 2018-01-22 DIAGNOSIS — M7521 Bicipital tendinitis, right shoulder: Secondary | ICD-10-CM

## 2018-01-22 DIAGNOSIS — G8929 Other chronic pain: Secondary | ICD-10-CM

## 2018-01-22 DIAGNOSIS — J449 Chronic obstructive pulmonary disease, unspecified: Secondary | ICD-10-CM

## 2018-01-22 DIAGNOSIS — M25512 Pain in left shoulder: Principal | ICD-10-CM

## 2018-01-22 DIAGNOSIS — D649 Anemia, unspecified: Secondary | ICD-10-CM | POA: Diagnosis not present

## 2018-01-22 DIAGNOSIS — D509 Iron deficiency anemia, unspecified: Secondary | ICD-10-CM

## 2018-01-22 DIAGNOSIS — K4090 Unilateral inguinal hernia, without obstruction or gangrene, not specified as recurrent: Secondary | ICD-10-CM | POA: Diagnosis not present

## 2018-01-22 DIAGNOSIS — M19011 Primary osteoarthritis, right shoulder: Secondary | ICD-10-CM

## 2018-01-22 DIAGNOSIS — Z720 Tobacco use: Secondary | ICD-10-CM

## 2018-01-22 DIAGNOSIS — M25511 Pain in right shoulder: Secondary | ICD-10-CM | POA: Diagnosis not present

## 2018-01-22 DIAGNOSIS — J439 Emphysema, unspecified: Secondary | ICD-10-CM | POA: Diagnosis not present

## 2018-01-22 DIAGNOSIS — R634 Abnormal weight loss: Secondary | ICD-10-CM

## 2018-01-22 MED ORDER — IOPAMIDOL (ISOVUE-300) INJECTION 61%
125.0000 mL | Freq: Once | INTRAVENOUS | Status: AC | PRN
  Administered 2018-01-22: 125 mL via INTRAVENOUS

## 2018-01-22 NOTE — Telephone Encounter (Signed)
Noted  

## 2018-01-22 NOTE — Telephone Encounter (Signed)
Gene with [place] [facility] called back and I let them know of Dr. Synthia Innocent message.

## 2018-01-22 NOTE — Telephone Encounter (Signed)
Ok to do this. Thank you.  

## 2018-01-23 ENCOUNTER — Encounter: Payer: Self-pay | Admitting: Family Medicine

## 2018-01-23 DIAGNOSIS — I700 Atherosclerosis of aorta: Secondary | ICD-10-CM | POA: Insufficient documentation

## 2018-01-23 DIAGNOSIS — I2721 Secondary pulmonary arterial hypertension: Secondary | ICD-10-CM | POA: Insufficient documentation

## 2018-01-23 DIAGNOSIS — I2510 Atherosclerotic heart disease of native coronary artery without angina pectoris: Secondary | ICD-10-CM | POA: Insufficient documentation

## 2018-01-23 DIAGNOSIS — I25118 Atherosclerotic heart disease of native coronary artery with other forms of angina pectoris: Secondary | ICD-10-CM | POA: Insufficient documentation

## 2018-01-24 ENCOUNTER — Other Ambulatory Visit: Payer: Self-pay | Admitting: Pain Medicine

## 2018-01-24 DIAGNOSIS — E559 Vitamin D deficiency, unspecified: Secondary | ICD-10-CM

## 2018-01-26 ENCOUNTER — Encounter: Payer: Self-pay | Admitting: Family Medicine

## 2018-01-26 ENCOUNTER — Ambulatory Visit (INDEPENDENT_AMBULATORY_CARE_PROVIDER_SITE_OTHER): Payer: Medicare Other | Admitting: Family Medicine

## 2018-01-26 ENCOUNTER — Other Ambulatory Visit: Payer: Self-pay | Admitting: Family Medicine

## 2018-01-26 VITALS — BP 126/68 | HR 56 | Temp 97.8°F | Wt 233.0 lb

## 2018-01-26 DIAGNOSIS — E538 Deficiency of other specified B group vitamins: Secondary | ICD-10-CM | POA: Diagnosis not present

## 2018-01-26 DIAGNOSIS — R972 Elevated prostate specific antigen [PSA]: Secondary | ICD-10-CM

## 2018-01-26 DIAGNOSIS — I700 Atherosclerosis of aorta: Secondary | ICD-10-CM | POA: Diagnosis not present

## 2018-01-26 DIAGNOSIS — G4733 Obstructive sleep apnea (adult) (pediatric): Secondary | ICD-10-CM | POA: Diagnosis not present

## 2018-01-26 DIAGNOSIS — I2721 Secondary pulmonary arterial hypertension: Secondary | ICD-10-CM | POA: Diagnosis not present

## 2018-01-26 DIAGNOSIS — J449 Chronic obstructive pulmonary disease, unspecified: Secondary | ICD-10-CM | POA: Diagnosis not present

## 2018-01-26 DIAGNOSIS — E669 Obesity, unspecified: Secondary | ICD-10-CM

## 2018-01-26 DIAGNOSIS — I716 Thoracoabdominal aortic aneurysm, without rupture, unspecified: Secondary | ICD-10-CM

## 2018-01-26 DIAGNOSIS — K4090 Unilateral inguinal hernia, without obstruction or gangrene, not specified as recurrent: Secondary | ICD-10-CM | POA: Diagnosis not present

## 2018-01-26 DIAGNOSIS — D509 Iron deficiency anemia, unspecified: Secondary | ICD-10-CM

## 2018-01-26 DIAGNOSIS — N400 Enlarged prostate without lower urinary tract symptoms: Secondary | ICD-10-CM | POA: Diagnosis not present

## 2018-01-26 LAB — IBC PANEL
IRON: 59 ug/dL (ref 42–165)
Saturation Ratios: 16.3 % — ABNORMAL LOW (ref 20.0–50.0)
Transferrin: 258 mg/dL (ref 212.0–360.0)

## 2018-01-26 LAB — CBC WITH DIFFERENTIAL/PLATELET
BASOS ABS: 0 10*3/uL (ref 0.0–0.1)
Basophils Relative: 0.5 % (ref 0.0–3.0)
EOS ABS: 0.2 10*3/uL (ref 0.0–0.7)
Eosinophils Relative: 2 % (ref 0.0–5.0)
HCT: 40.1 % (ref 39.0–52.0)
Hemoglobin: 12.4 g/dL — ABNORMAL LOW (ref 13.0–17.0)
LYMPHS ABS: 1.9 10*3/uL (ref 0.7–4.0)
Lymphocytes Relative: 20.7 % (ref 12.0–46.0)
MCHC: 31 g/dL (ref 30.0–36.0)
MCV: 79.7 fl (ref 78.0–100.0)
Monocytes Absolute: 0.5 10*3/uL (ref 0.1–1.0)
Monocytes Relative: 5.7 % (ref 3.0–12.0)
NEUTROS ABS: 6.5 10*3/uL (ref 1.4–7.7)
NEUTROS PCT: 71.1 % (ref 43.0–77.0)
PLATELETS: 262 10*3/uL (ref 150.0–400.0)
RBC: 5.02 Mil/uL (ref 4.22–5.81)
RDW: 29.8 % — ABNORMAL HIGH (ref 11.5–15.5)
WBC: 9.2 10*3/uL (ref 4.0–10.5)

## 2018-01-26 LAB — VITAMIN B12: Vitamin B-12: 953 pg/mL — ABNORMAL HIGH (ref 211–911)

## 2018-01-26 LAB — FERRITIN: Ferritin: 56.3 ng/mL (ref 22.0–322.0)

## 2018-01-26 MED ORDER — TOPIRAMATE 25 MG PO TABS: 25.0000 mg | ORAL_TABLET | Freq: Every day | ORAL | 0 refills | Status: DC

## 2018-01-26 NOTE — Patient Instructions (Addendum)
We will refer you back to sleep and lung doctor to discuss sleep apnea options now available.  May try topamax 1-2 tablets at night for appetite suppression. Let me know how this helps.  Continue iron tablets once daily. Labs today to recheck anemia. If not improving over time, we will proceed with iron infusion.  Continue iron rich diet.  Return in 2-3 months for follow up visit with labs.

## 2018-01-26 NOTE — Progress Notes (Signed)
BP 126/68 (BP Location: Left Arm, Patient Position: Sitting, Cuff Size: Normal)   Pulse (!) 56   Temp 97.8 F (36.6 C) (Oral)   Wt 233 lb (105.7 kg)   SpO2 97%   BMI 34.41 kg/m    CC: discuss results Subjective:    Patient ID: [name], male    DOB: [birth year], 72 y.o.   MRN: [id number]  HPI: [name] is a 72 y.o. male presenting on 01/26/2018 for Results   See prior notes for details.  Recent CT chest/abd/pelvis reviewed with patient. Obtained for IDA and weight loss. Reviewed reassuring results, reviewed R inguinal hernia with appendix involved (new). Denies RLQ pain or worsening bulging or discomfort at known R hernia - states present for years, longterm, never bothered him.   Known h/o severe OSA - pt states last time he was tried on CPAP the machine was very large and heated up the entire room, increased power bill so he did not want to use. Also had trouble with face mask. Interested in discussion on new machine options. Previously saw Dr Annamaria Boots.  Obesity - previous concern for weight loss, now weight gain noted and he is interested in medication to help with appetite.   Iron tablet BID caused constipation and nausea - he has decreased to once daily but continues taking daily.   Relevant past medical, surgical, family and social history reviewed and updated as indicated. Interim medical history since our last visit reviewed. Allergies and medications reviewed and updated. Outpatient Medications Prior to Visit  Medication Sig Dispense Refill  . amiodarone (PACERONE) 200 MG tablet Take 1 tablet (200 mg total) by mouth 3 (three) times daily. 90 tablet 6  . Cholecalciferol 5000 units capsule Take 1 capsule (5,000 Units total) by mouth daily. 90 capsule 0  . Coenzyme Q10 100 MG capsule Take 1 capsule (100 mg total) by mouth daily. 30 capsule 0  . cyanocobalamin ([employer] VITAMIN B12) 2000 MCG tablet Take 1 tablet (2,000 mcg total) by mouth daily. 90 tablet 0  . ergocalciferol  (VITAMIN D2) 50000 units capsule Take 1 capsule (50,000 Units total) by mouth 2 (two) times a week. X 6 weeks. 12 capsule 0  . ferrous sulfate 325 (65 FE) MG tablet Take 1 tablet (325 mg total) by mouth 2 (two) times daily with a meal.    . fish oil-omega-3 fatty acids 1000 MG capsule Take 2 g by mouth daily.     . furosemide (LASIX) 20 MG tablet Take 1 tablet (20 mg total) by mouth daily as needed for edema.    . gabapentin (NEURONTIN) 600 MG tablet 600 mg 2 (two) times daily.     Marland Kitchen HYDROcodone-acetaminophen (NORCO) 10-325 MG tablet Take 1 tablet by mouth every 8 (eight) hours as needed for severe pain. 90 tablet 0  . omeprazole (PRILOSEC) 40 MG capsule TAKE 1 CAPSULE BY MOUTH EVERY DAY 90 capsule 0  . PROAIR HFA 108 (90 Base) MCG/ACT inhaler INHALE 2 PUFFS BY MOUTH EVERY 6 HOURS AS NEEDED FOR WHEEZING OR SHORTNESS OF BREATH 17 g 5  . tiZANidine (ZANAFLEX) 4 MG tablet TAKE 1 TABLET BY MOUTH TWICE DAILY AS NEEDED FOR MUSCLE SPASMS 180 tablet 0  . warfarin (COUMADIN) 5 MG tablet TAKE AS DIRECTED BY COUMADIN CLINIC 100 tablet 0   No facility-administered medications prior to visit.      Per HPI unless specifically indicated in ROS section below Review of Systems     Objective:  BP 126/68 (BP Location: Left Arm, Patient Position: Sitting, Cuff Size: Normal)   Pulse (!) 56   Temp 97.8 F (36.6 C) (Oral)   Wt 233 lb (105.7 kg)   SpO2 97%   BMI 34.41 kg/m   Wt Readings from Last 3 Encounters:  01/26/18 233 lb (105.7 kg)  01/06/18 227 lb (103 kg)  01/04/18 229 lb (103.9 kg)    Physical Exam  Constitutional: He appears well-developed and well-nourished. No distress.  Abdominal: Soft. Bowel sounds are normal. A hernia is present. Hernia confirmed positive in the right inguinal area (evident hernia without pain or discomfort to palpation). Hernia confirmed negative in the left inguinal area.  Musculoskeletal: He exhibits no edema.  Nursing note and vitals reviewed.  Results for orders  placed or performed in visit on 01/26/18  CBC with Differential/Platelet  Result Value Ref Range   WBC 9.2 4.0 - 10.5 K/uL   RBC 5.02 4.22 - 5.81 Mil/uL   Hemoglobin 12.4 (L) 13.0 - 17.0 g/dL   HCT 40.1 39.0 - 52.0 %   MCV 79.7 78.0 - 100.0 fl   MCHC 31.0 30.0 - 36.0 g/dL   RDW 29.8 Repeated and verified X2. (H) 11.5 - 15.5 %   Platelets 262.0 150.0 - 400.0 K/uL   Neutrophils Relative % 71.1 43.0 - 77.0 %   Lymphocytes Relative 20.7 12.0 - 46.0 %   Monocytes Relative 5.7 3.0 - 12.0 %   Eosinophils Relative 2.0 0.0 - 5.0 %   Basophils Relative 0.5 0.0 - 3.0 %   Neutro Abs 6.5 1.4 - 7.7 K/uL   Lymphs Abs 1.9 0.7 - 4.0 K/uL   Monocytes Absolute 0.5 0.1 - 1.0 K/uL   Eosinophils Absolute 0.2 0.0 - 0.7 K/uL   Basophils Absolute 0.0 0.0 - 0.1 K/uL  Ferritin  Result Value Ref Range   Ferritin 56.3 22.0 - 322.0 ng/mL  IBC panel  Result Value Ref Range   Iron 59 42 - 165 ug/dL   Transferrin 258.0 212.0 - 360.0 mg/dL   Saturation Ratios 16.3 (L) 20.0 - 50.0 %  Vitamin B12  Result Value Ref Range   Vitamin B-12 953 (H) 211 - 911 pg/mL      Assessment & Plan:   Problem List Items Addressed This Visit    COPD mixed type (HCC)    Albuterol PRN. Not on controller medication. Denies significant dyspnea.       Elevated PSA    Elevated PSA with low free PSA %. appt 3/1 with urology. CT with prostatomegaly.      Enlarged prostate   Iron deficiency anemia - Primary    Ongoing. Endorses compliance with once daily iron supplement. Update labs. If poor replacement on oral iron, low threshold for IV iron infusion. Pt agrees. Discussed no obvious cause found for IDA however optimal study is direct visualization of colon which he has declined.       Relevant Orders   CBC with Differential/Platelet (Completed)   Ferritin (Completed)   IBC panel (Completed)   Obesity, Class I, BMI 30.0-34.9 (see actual BMI)    Discussed goal weight loss. Will trial topamax. Reviewed side effects to monitor  for including paresthesias, word finding difficulty. rec take at night time. Trial 1 month.      OSA (obstructive sleep apnea)    Refer back to pulm to review options. Pt agrees.      Relevant Orders   Ambulatory referral to Pulmonology   PAH (pulmonary artery hypertension) (  [place])    Discussed recent finding on CT scan - as well as importance of control of COPD and OSA. He is interested in return to pulm to discuss CPAP again.       Relevant Orders   Ambulatory referral to Pulmonology   Right inguinal hernia    Reviewed finding with patient. He states it has been present for years and has never bothered him. Declines gen surg eval at this time, will let me know if hernia becomes symptomatic.      Thoracic aortic atherosclerosis ([place])    Not on aspirin or statin. On coumadin. Continue fish oil.       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HCC)    Stable on CT      Vitamin B12 deficiency    He endorses compliance with 2000 mcg daily. Update b12 level.       Relevant Orders   Vitamin B12 (Completed)       Meds ordered this encounter  Medications  . DISCONTD: topiramate (TOPAMAX) 25 MG tablet    Sig: Take 1-2 tablets (25-50 mg total) by mouth at bedtime.    Dispense:  50 tablet    Refill:  0   Orders Placed This Encounter  Procedures  . CBC with Differential/Platelet  . Ferritin  . IBC panel  . Vitamin B12  . Ambulatory referral to Pulmonology    Referral Priority:   Routine    Referral Type:   Consultation    Referral Reason:   Specialty Services Required    Requested Specialty:   Pulmonary Disease    Number of Visits Requested:   1    Follow up plan: Return in about 3 months (around 04/25/2018) for follow up visit.  Ria Bush, MD

## 2018-01-27 ENCOUNTER — Other Ambulatory Visit: Payer: Self-pay | Admitting: Pain Medicine

## 2018-01-27 DIAGNOSIS — K4090 Unilateral inguinal hernia, without obstruction or gangrene, not specified as recurrent: Secondary | ICD-10-CM | POA: Insufficient documentation

## 2018-01-27 DIAGNOSIS — S46811S Strain of other muscles, fascia and tendons at shoulder and upper arm level, right arm, sequela: Secondary | ICD-10-CM

## 2018-01-27 DIAGNOSIS — E669 Obesity, unspecified: Secondary | ICD-10-CM | POA: Insufficient documentation

## 2018-01-27 DIAGNOSIS — M19011 Primary osteoarthritis, right shoulder: Secondary | ICD-10-CM

## 2018-01-27 DIAGNOSIS — IMO0001 Reserved for inherently not codable concepts without codable children: Secondary | ICD-10-CM

## 2018-01-27 NOTE — Assessment & Plan Note (Signed)
Stable on CT

## 2018-01-27 NOTE — Assessment & Plan Note (Signed)
Discussed goal weight loss. Will trial topamax. Reviewed side effects to monitor for including paresthesias, word finding difficulty. rec take at night time. Trial 1 month.

## 2018-01-27 NOTE — Assessment & Plan Note (Signed)
Ongoing. Endorses compliance with once daily iron supplement. Update labs. If poor replacement on oral iron, low threshold for IV iron infusion. Pt agrees. Discussed no obvious cause found for IDA however optimal study is direct visualization of colon which he has declined.

## 2018-01-27 NOTE — Progress Notes (Signed)
Results were reviewed and found to be: significantly abnormal  Surgical consultation is recommended.  I would recommend a referral to an orthopedic surgeon.  Review would suggest the patient to be a possible candidate for interventional pain management options

## 2018-01-27 NOTE — Assessment & Plan Note (Addendum)
Albuterol PRN. Not on controller medication. Denies significant dyspnea.

## 2018-01-27 NOTE — Assessment & Plan Note (Signed)
Reviewed finding with patient. He states it has been present for years and has never bothered him. Declines gen surg eval at this time, will let me know if hernia becomes symptomatic.

## 2018-01-27 NOTE — Assessment & Plan Note (Signed)
Refer back to pulm to review options. Pt agrees.

## 2018-01-27 NOTE — Assessment & Plan Note (Signed)
Not on aspirin or statin. On coumadin. Continue fish oil.

## 2018-01-27 NOTE — Assessment & Plan Note (Addendum)
Elevated PSA with low free PSA %. appt 3/1 with urology. CT with prostatomegaly.

## 2018-01-27 NOTE — Assessment & Plan Note (Signed)
He endorses compliance with 2000 mcg daily. Update b12 level.

## 2018-01-27 NOTE — Assessment & Plan Note (Signed)
Discussed recent finding on CT scan - as well as importance of control of COPD and OSA. He is interested in return to pulm to discuss CPAP again.

## 2018-01-30 ENCOUNTER — Other Ambulatory Visit: Payer: Self-pay | Admitting: Family Medicine

## 2018-01-30 ENCOUNTER — Other Ambulatory Visit: Payer: Self-pay | Admitting: Cardiovascular Disease

## 2018-01-30 DIAGNOSIS — E538 Deficiency of other specified B group vitamins: Secondary | ICD-10-CM

## 2018-01-30 MED ORDER — FERROUS SULFATE 325 (65 FE) MG PO TABS: 325.0000 mg | ORAL_TABLET | Freq: Every day | ORAL | Status: DC

## 2018-02-01 ENCOUNTER — Encounter: Payer: Self-pay | Admitting: Internal Medicine

## 2018-02-01 ENCOUNTER — Ambulatory Visit (INDEPENDENT_AMBULATORY_CARE_PROVIDER_SITE_OTHER): Payer: Medicare Other

## 2018-02-01 ENCOUNTER — Ambulatory Visit (INDEPENDENT_AMBULATORY_CARE_PROVIDER_SITE_OTHER): Payer: Medicare Other | Admitting: Internal Medicine

## 2018-02-01 VITALS — BP 118/60 | HR 54 | Ht 69.0 in | Wt 233.0 lb

## 2018-02-01 DIAGNOSIS — G4719 Other hypersomnia: Secondary | ICD-10-CM

## 2018-02-01 DIAGNOSIS — Z5181 Encounter for therapeutic drug level monitoring: Secondary | ICD-10-CM | POA: Diagnosis not present

## 2018-02-01 DIAGNOSIS — I4891 Unspecified atrial fibrillation: Secondary | ICD-10-CM | POA: Diagnosis not present

## 2018-02-01 DIAGNOSIS — F17210 Nicotine dependence, cigarettes, uncomplicated: Secondary | ICD-10-CM

## 2018-02-01 DIAGNOSIS — R0609 Other forms of dyspnea: Secondary | ICD-10-CM

## 2018-02-01 LAB — POCT INR: INR: 2.2

## 2018-02-01 NOTE — Patient Instructions (Signed)
Please continue taking 1/2 tablet every day except 1 tablet on Mondays, Wednesdays and Fridays.  Recheck in 4 weeks 
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
## 2018-02-01 NOTE — Patient Instructions (Signed)
Will send for a pulmonary function test. Will perform a 6 minute walk test.  Will order overnight oxymetry to see if you need oxygen overnight.  Will order a home sleep study.

## 2018-02-03 ENCOUNTER — Encounter: Payer: Self-pay | Admitting: Family Medicine

## 2018-02-03 ENCOUNTER — Encounter: Payer: Self-pay | Admitting: Pain Medicine

## 2018-02-03 ENCOUNTER — Other Ambulatory Visit: Payer: Self-pay

## 2018-02-03 ENCOUNTER — Ambulatory Visit (INDEPENDENT_AMBULATORY_CARE_PROVIDER_SITE_OTHER): Payer: Medicare Other | Admitting: Family Medicine

## 2018-02-03 ENCOUNTER — Ambulatory Visit: Payer: Medicare Other | Attending: Pain Medicine | Admitting: Pain Medicine

## 2018-02-03 VITALS — BP 118/62 | HR 51 | Temp 97.9°F | Wt 228.0 lb

## 2018-02-03 VITALS — BP 137/56 | HR 51 | Temp 97.7°F | Resp 18 | Ht 69.0 in | Wt 230.0 lb

## 2018-02-03 DIAGNOSIS — G894 Chronic pain syndrome: Secondary | ICD-10-CM

## 2018-02-03 DIAGNOSIS — G8929 Other chronic pain: Secondary | ICD-10-CM | POA: Diagnosis not present

## 2018-02-03 DIAGNOSIS — M25511 Pain in right shoulder: Secondary | ICD-10-CM | POA: Diagnosis not present

## 2018-02-03 DIAGNOSIS — G4733 Obstructive sleep apnea (adult) (pediatric): Secondary | ICD-10-CM | POA: Diagnosis not present

## 2018-02-03 DIAGNOSIS — F1120 Opioid dependence, uncomplicated: Secondary | ICD-10-CM

## 2018-02-03 DIAGNOSIS — M7521 Bicipital tendinitis, right shoulder: Secondary | ICD-10-CM

## 2018-02-03 DIAGNOSIS — M19011 Primary osteoarthritis, right shoulder: Secondary | ICD-10-CM

## 2018-02-03 DIAGNOSIS — M25512 Pain in left shoulder: Secondary | ICD-10-CM | POA: Diagnosis not present

## 2018-02-03 DIAGNOSIS — I714 Abdominal aortic aneurysm, without rupture, unspecified: Secondary | ICD-10-CM

## 2018-02-03 DIAGNOSIS — I716 Thoracoabdominal aortic aneurysm, without rupture, unspecified: Secondary | ICD-10-CM

## 2018-02-03 DIAGNOSIS — Z7901 Long term (current) use of anticoagulants: Secondary | ICD-10-CM

## 2018-02-03 DIAGNOSIS — Z79899 Other long term (current) drug therapy: Secondary | ICD-10-CM

## 2018-02-03 DIAGNOSIS — Z79891 Long term (current) use of opiate analgesic: Secondary | ICD-10-CM

## 2018-02-03 MED ORDER — HYDROCODONE-ACETAMINOPHEN 10-325 MG PO TABS: 1.0000 | ORAL_TABLET | Freq: Three times a day (TID) | ORAL | 0 refills | Status: DC | PRN

## 2018-02-03 MED ORDER — DICLOFENAC SODIUM 1 % TD GEL: 1.0000 "application " | Freq: Three times a day (TID) | TRANSDERMAL | 3 refills | Status: DC

## 2018-02-03 NOTE — Progress Notes (Signed)
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 Take 1 tablet (1,000 mcg total) by mouth daily., Disp: , Rfl:  .  warfarin (COUMADIN) 5 MG tablet, TAKE AS DIRECTED BY COUMADIN CLINIC, Disp: 100 tablet, Rfl: 0  Allergies  Mr. Rask is allergic to oxycodone hcl; metformin and related; varenicline tartrate; varenicline tartrate; wellbutrin [bupropion hcl]; and zocor [simvastatin].  Constitutional Exam   Vitals:   02/03/18 0802  BP: (!) 137/56  Pulse: (!) 51  Resp: 18  Temp: 97.7 F (36.5 C)  SpO2: 95%  Weight: 230 lb (104.3 kg)  Height: 5\' 9"  (1.753 m)   BMI Assessment: Estimated body mass index is 33.97 kg/m as calculated from the following:   Height as of this encounter: 5\' 9"  (1.753 m).   Weight as of this encounter: 230 lb (104.3 kg).  BMI interpretation table: BMI level Category Range association with higher incidence of chronic pain  <18 kg/m2 Underweight   18.5-24.9  kg/m2 Ideal body weight   25-29.9 kg/m2 Overweight Increased incidence by 20%  30-34.9 kg/m2 Obese (Class I) Increased incidence by 68%  35-39.9 kg/m2 Severe obesity (Class II) Increased incidence by 136%  >40 kg/m2 Extreme obesity (Class III) Increased incidence by 254%   BMI Readings from Last 4 Encounters:  02/03/18 33.67 kg/m  02/03/18 33.97 kg/m  02/01/18 34.41 kg/m  01/26/18 34.41 kg/m   Wt Readings from Last 4 Encounters:  02/03/18 228 lb (103.4 kg)  02/03/18 230 lb (104.3 kg)  02/01/18 233 lb (105.7 kg)  01/26/18 233 lb (105.7 kg)   Assessment   Status Diagnosis  Controlled Controlled Controlled 1. Chronic shoulder pain (Primary Area of Pain) (Bilateral) (R>L)   2. Arthropathy of right shoulder   3. Biceps tendinitis of right shoulder   4. Chronic pain syndrome   5. Pharmacologic therapy   6. Long term prescription opiate use   7. Current use of long term anticoagulation (Coumadin)     Problems updated and reviewed in preparation for this visit: Problem  Arthropathy of Right Shoulder  Opiate Use  Long Term Prescription Opiate Use  Right Inguinal Hernia  Obesity, Class I, Bmi 30.0-34.9 (See Actual Bmi)  Thoracic Aortic Atherosclerosis (Hcc)   By CT   Cad (Coronary Artery Disease)   By CT   Pah (Pulmonary Artery Hypertension) (Hcc)   Suggested by CT 01/2018   Advanced Care Planning/Counseling Discussion  Elevated Psa   Plan of Care  Pharmacotherapy (Medications Ordered): No orders of the defined types were placed in this encounter.  Medications administered today: [name] had no medications administered during this visit.  Procedure Orders    No procedure(s) ordered today   Lab Orders  No laboratory test(s) ordered today   Imaging Orders  No imaging studies ordered today   Referral Orders  No referral(s) requested today   Interventional management options: Planned, scheduled, and/or pending:   The patient walked out of  facility. Note: Stop Coumadin x 5 days, prior to procedures.   Treatments taken into consideration:   Diagnostic right intra-articular shoulder injection  Diagnosticright suprascapular nerve block  Possible right suprascapular RFA  Diagnostic cervical epidural steroid injection  Diagnostic cervical facet block  Possible cervical facet RFA    Palliative PRN treatment(s):   None at this time   Provider-requested follow-up: No further appointments to be scheduled.  Future Appointments  Date Time Provider [place]  03/01/2018  7:30 AM [facility]   Primary Care Physician: Ria Bush, MD Location: [facility] Note by: Gaspar Cola, MD Date: 02/03/2018; Time: 4:15 PM

## 2018-02-03 NOTE — Progress Notes (Signed)
Safety precautions to be maintained throughout the outpatient stay will include: orient to surroundings, keep bed in low position, maintain call bell within reach at all times, provide assistance with transfer out of bed and ambulation. Patient did not bring pills for pill count.  When informed that he needed to bring pills or empty pill bottles, he got up and said "Im not doing this.  I didn't even have to go in for an appointment to get my pain meds from my other doctor, and I wont be coming back"

## 2018-02-03 NOTE — Patient Instructions (Addendum)
We will re establish pain management through our office.  We will do every 3 month office visit.  Sign controlled substance agreement today.  Try voltaren gel for R shoulder and back - pea sized amount each time.  Return in 3 months for pain management visit.

## 2018-02-03 NOTE — Progress Notes (Signed)
BP 118/62 (BP Location: Left Arm, Patient Position: Sitting, Cuff Size: Normal)   Pulse (!) 51   Temp 97.9 F (36.6 C) (Oral)   Wt 228 lb (103.4 kg)   SpO2 94%   BMI 33.67 kg/m    CC: narcotic concerns Subjective:    Patient ID: [name], male    DOB: [birth year], 72 y.o.   MRN: [id number]  HPI: [name] is a 72 y.o. male presenting on 02/03/2018 for Discuss medications (Wants to discuss hydrocodone. Having issues with pain management.)   Referred to pain management 09/2017 for further evaluation of his ongoing chronic pain. Workup revealed multilevel lumbar DDD (lumbar MRI 12/2015), cervical spondylosis multilevel arthropathy and moderate spinal stenosis C5/6 and C6/7 (cervical MRI 01/2017), and complete R supraspinatus and infraspinatus tendon tears with high grade tear of subscapularis and severe tendinopathy/near complete tear of intraarticular long head of biceps (R shoulder MRI 01/2018).   Seen today by pain management today Dossie Arbour), refused to follow pain management policy of bringing pill bottles to each appointment, self discharged himself from pain management.  Current pain regimen is hydrocodone 10/325mg  1 tab TID #90/month.  Last UDS 09/2017 - appropriate.  Due for medication refill today.   He regularly uses OTC salon pas pad for R shoulder.  Shoulder steroid injection only provided 4 days of relief.  He is adamant he does not want to have surgery for R shoulder.   Saw pulm earlier this week, note reviewed - refused further evaluation for OSA at this time.  Overdue for [facility] f/u.    Relevant past medical, surgical, family and social history reviewed and updated as indicated. Interim medical history since our last visit reviewed. Allergies and medications reviewed and updated. Outpatient Medications Prior to Visit  Medication Sig Dispense Refill  . amiodarone (PACERONE) 200 MG tablet Take 1 tablet (200 mg total) by mouth 3 (three) times daily. 90 tablet 6  .  Cholecalciferol 5000 units capsule Take 1 capsule (5,000 Units total) by mouth daily. 90 capsule 0  . Coenzyme Q10 100 MG capsule Take 1 capsule (100 mg total) by mouth daily. 30 capsule 0  . ferrous sulfate 325 (65 FE) MG tablet Take 1 tablet (325 mg total) by mouth daily.    . fish oil-omega-3 fatty acids 1000 MG capsule Take 2 g by mouth daily.     . furosemide (LASIX) 20 MG tablet Take 1 tablet (20 mg total) by mouth daily as needed for edema.    . furosemide (LASIX) 20 MG tablet TAKE 1 TABLET BY MOUTH AS NEEDED FOR FLUID 30 tablet 5  . gabapentin (NEURONTIN) 600 MG tablet 600 mg 2 (two) times daily.     . metFORMIN (GLUCOPHAGE) 500 MG tablet TAKE 1 TABLET BY MOUTH EVERY MORNING AND 2 TABLETS EVERY EVENING 90 tablet 1  . omeprazole (PRILOSEC) 40 MG capsule TAKE 1 CAPSULE BY MOUTH EVERY DAY 90 capsule 0  . potassium chloride (K-DUR,KLOR-CON) 10 MEQ tablet TAKE 2 TABLETS(20 MEQ) BY MOUTH TWICE DAILY 120 tablet 0  . PROAIR HFA 108 (90 Base) MCG/ACT inhaler INHALE 2 PUFFS BY MOUTH EVERY 6 HOURS AS NEEDED FOR WHEEZING OR SHORTNESS OF BREATH 17 g 5  . tiZANidine (ZANAFLEX) 4 MG tablet TAKE 1 TABLET BY MOUTH TWICE DAILY AS NEEDED FOR MUSCLE SPASMS 180 tablet 0  . topiramate (TOPAMAX) 25 MG tablet TAKE 1 TO 2 TABLETS(25 TO 50 MG) BY MOUTH AT BEDTIME 180 tablet 3  . vitamin B-12 (CYANOCOBALAMIN)  1000 MCG tablet Take 1 tablet (1,000 mcg total) by mouth daily.    Marland Kitchen warfarin (COUMADIN) 5 MG tablet TAKE AS DIRECTED BY COUMADIN CLINIC 100 tablet 0  . HYDROcodone-acetaminophen (NORCO) 10-325 MG tablet Take 1 tablet by mouth every 8 (eight) hours as needed for severe pain. 90 tablet 0   No facility-administered medications prior to visit.      Per HPI unless specifically indicated in ROS section below Review of Systems     Objective:    BP 118/62 (BP Location: Left Arm, Patient Position: Sitting, Cuff Size: Normal)   Pulse (!) 51   Temp 97.9 F (36.6 C) (Oral)   Wt 228 lb (103.4 kg)   SpO2 94%    BMI 33.67 kg/m   Wt Readings from Last 3 Encounters:  02/03/18 228 lb (103.4 kg)  02/03/18 230 lb (104.3 kg)  02/01/18 233 lb (105.7 kg)    Physical Exam  Constitutional: He appears well-developed and well-nourished. No distress.  Walks with cane  Psychiatric: He has a normal mood and affect.  Nursing note and vitals reviewed.  Results for orders placed or performed in visit on 02/01/18  POCT INR  Result Value Ref Range   INR 2.2    R shoulder MRI without contrast IMPRESSION: Chronic, complete supraspinatus and infraspinatus tendon tears with 3.5-4.5 cm of retraction and marked fatty atrophy of both muscle bellies. Cystic change in the acromion is consistent with chronic abutment with the humeral head. Severe subscapularis and high-grade tearing of the entire tendon. Only a thin strand of normal-appearing tendon is intact. Severe tendinopathy and near complete tear of the intra-articular long head of biceps. Bulky acromioclavicular osteoarthritis. Mild moderate glenohumeral osteoarthritis Electronically Signed   By: Inge Rise M.D.   On: 01/22/2018 13:41    Assessment & Plan:   Problem List Items Addressed This Visit    Chronic pain syndrome (Chronic)    Self discharged himself from pain management.  Will establish pain management at our office.  Update controlled substance agreement today, reviewed expectations to receive chronic narcotics from our office, need to return for Q3 month chronic pain management visits.  [place] CSRS reviewed and appropriate.       Relevant Medications   HYDROcodone-acetaminophen (NORCO) 10-325 MG tablet   Chronic shoulder pain (Primary Area of Pain) (Bilateral) (R>L) (Chronic)    Adamant about not wanting shoulder surgery. Will continue hydrocodone Rx, reviewed salon pas use, will Rx voltaren gel as well.       Encounter for chronic pain management - Primary    [place] CSRS reviewed and appropriate.  Reviewed expectations for ongoing  controlled substances through our office.       Opiate dependence (HCC) (Chronic)   OSA (obstructive sleep apnea)    Not on CPAP. Refused to undergo repeat sleep study recommended by pulm.       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           Meds ordered this encounter  Medications  . HYDROcodone-acetaminophen (NORCO) 10-325 MG tablet    Sig: Take 1 tablet by mouth every 8 (eight) hours as needed for severe pain.    Dispense:  90 tablet    Refill:  0    Fill on 02/03/18  . diclofenac sodium (VOLTAREN) 1 % GEL    Sig: Apply 1 application  topically 3 (three) times daily.    Dispense:  1 Tube    Refill:  3   No orders of the defined types were placed in this encounter.   Follow up plan: Return in about 3 months (around 05/03/2018) for follow up visit.  Ria Bush, MD

## 2018-02-03 NOTE — Assessment & Plan Note (Signed)
[place] CSRS reviewed and appropriate.  Reviewed expectations for ongoing controlled substances through our office.

## 2018-02-03 NOTE — Assessment & Plan Note (Signed)
Self discharged himself from pain management.  Will establish pain management at our office.  Update controlled substance agreement today, reviewed expectations to receive chronic narcotics from our office, need to return for Q3 month chronic pain management visits.  [place] CSRS reviewed and appropriate.

## 2018-02-04 NOTE — Assessment & Plan Note (Signed)
Not on CPAP. Refused to undergo repeat sleep study recommended by pulm.

## 2018-02-04 NOTE — Assessment & Plan Note (Signed)
Last saw [facility] 07/2017 - due for f/u.

## 2018-02-04 NOTE — Assessment & Plan Note (Signed)
Adamant about not wanting shoulder surgery. Will continue hydrocodone Rx, reviewed salon pas use, will Rx voltaren gel as well.

## 2018-02-04 NOTE — Assessment & Plan Note (Signed)
Overdue for [facility] f/u.

## 2018-02-05 ENCOUNTER — Other Ambulatory Visit: Payer: Self-pay | Admitting: Family Medicine

## 2018-02-12 ENCOUNTER — Other Ambulatory Visit: Payer: Self-pay | Admitting: Cardiovascular Disease

## 2018-02-12 NOTE — Telephone Encounter (Signed)
Please review for refill, Thanks !  

## 2018-02-15 ENCOUNTER — Ambulatory Visit: Payer: Medicare Other | Admitting: Pain Medicine

## 2018-02-17 ENCOUNTER — Other Ambulatory Visit: Payer: Self-pay | Admitting: Family Medicine

## 2018-02-17 NOTE — Telephone Encounter (Addendum)
Spoke with [facility]- [address] asking confirming receipt of refill sent 01/26/18.  Denies receipt.  Re-sent rx.

## 2018-02-19 ENCOUNTER — Telehealth: Payer: Self-pay

## 2018-02-19 NOTE — Telephone Encounter (Signed)
I do not see any telephone encounters, lab results or other messages indicating anyone from this office called the pt.

## 2018-02-19 NOTE — Telephone Encounter (Signed)
Copied from [place] [phone]. Topic: General - Call Back - No Documentation >> Feb 19, 2018 12:20 PM Ahmed Prima L wrote: Reason for CRM: pt said he missed call from office about 15 mins ago, no message was left. Wasn't sure why he missed a call [phone]

## 2018-02-25 ENCOUNTER — Telehealth: Payer: Self-pay

## 2018-02-25 NOTE — Telephone Encounter (Signed)
Left message for patient to call Fawn Desrocher back-Yuepheng Schaller Estell Harpin, RMA
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
## 2018-03-01 ENCOUNTER — Ambulatory Visit (INDEPENDENT_AMBULATORY_CARE_PROVIDER_SITE_OTHER): Payer: Medicare Other

## 2018-03-01 ENCOUNTER — Ambulatory Visit (INDEPENDENT_AMBULATORY_CARE_PROVIDER_SITE_OTHER): Payer: Medicare Other | Admitting: Cardiovascular Disease

## 2018-03-01 VITALS — BP 102/56 | HR 47 | Ht 67.0 in | Wt 231.0 lb

## 2018-03-01 DIAGNOSIS — I481 Persistent atrial fibrillation: Secondary | ICD-10-CM

## 2018-03-01 DIAGNOSIS — Z5181 Encounter for therapeutic drug level monitoring: Secondary | ICD-10-CM | POA: Diagnosis not present

## 2018-03-01 DIAGNOSIS — I4891 Unspecified atrial fibrillation: Secondary | ICD-10-CM | POA: Diagnosis not present

## 2018-03-01 DIAGNOSIS — I4819 Other persistent atrial fibrillation: Secondary | ICD-10-CM

## 2018-03-01 DIAGNOSIS — I495 Sick sinus syndrome: Secondary | ICD-10-CM | POA: Diagnosis not present

## 2018-03-01 DIAGNOSIS — E1142 Type 2 diabetes mellitus with diabetic polyneuropathy: Secondary | ICD-10-CM

## 2018-03-01 DIAGNOSIS — I714 Abdominal aortic aneurysm, without rupture, unspecified: Secondary | ICD-10-CM

## 2018-03-01 DIAGNOSIS — I25118 Atherosclerotic heart disease of native coronary artery with other forms of angina pectoris: Secondary | ICD-10-CM

## 2018-03-01 DIAGNOSIS — J449 Chronic obstructive pulmonary disease, unspecified: Secondary | ICD-10-CM

## 2018-03-01 LAB — POCT INR: INR: 2.1

## 2018-03-01 MED ORDER — METOPROLOL TARTRATE 25 MG PO TABS: 25.0000 mg | ORAL_TABLET | ORAL | 3 refills | Status: DC

## 2018-03-01 NOTE — Patient Instructions (Addendum)
Medication Instructions:  Your physician has recommended you make the following change in your medication:  1. TAKE Metoprolol tartrate 25 mg daily at 5 pm 2. ONLY take diltiazem as needed for fast heart rates at NIGHT 3. Try not to take amiodarone more than twice a day (AM & PM)  Labwork:  No new labs needed  Testing/Procedures:  No further testing at this time   Follow-Up: It was a pleasure seeing you in the office today. Please call us if you have new issues that need to be addressed before your next appt.  [phone]  Your physician wants you to follow-up in: 6 months.  You will receive a reminder letter in the mail two months in advance. If you don't receive a letter, please call our office to schedule the follow-up appointment.  If you need a refill on your cardiac medications before your next appointment, please call your pharmacy.  For educational health videos Log in to : [email or web address] Or : [email or web address], password : [email or web address]

## 2018-03-01 NOTE — Patient Instructions (Signed)
Please continue taking 1/2 tablet every day except 1 tablet on Mondays, Wednesdays and Fridays.  Recheck in 4 weeks 

## 2018-03-02 ENCOUNTER — Telehealth: Payer: Self-pay | Admitting: Cardiovascular Disease

## 2018-03-02 NOTE — Telephone Encounter (Signed)
Spoke with patient and he keeps saying that Dr. Rockey Situ told him NOT to take the metoprolol tartrate. Reviewed several times instructions and he progressively got louder and louder stating that he will NOT take that medication. Tried explaining everything to him and he is very adamant. Will discuss with Dr. Rockey Situ to see if he could maybe call and clarify this medication with him.

## 2018-03-02 NOTE — Telephone Encounter (Signed)
Patient calling about medication  Needs clarification on the new medication Dr Rockey Situ prescribed at last visit Please call to discuss

## 2018-03-02 NOTE — Telephone Encounter (Signed)
Spoke with pharmacy tech and she reports that patient picked prescription up earlier in Jan 2019 and reports that he should have some of that left. Advised that he was confused with his medications and has been taking multiple meds at different times. She knows him well and let her know that I would call him back to clarify this information. She was thankful for the call with no further questions.

## 2018-03-02 NOTE — Telephone Encounter (Signed)
Spoke with patient and he reports that when he went to pharmacy they told him he was on the same medication and a lower dose than before. Reviewed medication changes that were made yesterday and advised that I would give the pharmacy a call. He verbalized understanding and was very appreciative for the calls.

## 2018-03-02 NOTE — Telephone Encounter (Signed)
Spoke with patients wife per release form and reviewed Dr. Donivan Scull recommendations for his medications. She verbalized understanding of all instructions, read back the information, and had no further questions at this time.

## 2018-03-09 ENCOUNTER — Other Ambulatory Visit: Payer: Self-pay | Admitting: Family Medicine

## 2018-03-09 DIAGNOSIS — G894 Chronic pain syndrome: Secondary | ICD-10-CM

## 2018-03-09 NOTE — Telephone Encounter (Signed)
LOV 02/03/18 Dr. Jeral Pinch.

## 2018-03-09 NOTE — Telephone Encounter (Signed)
Last rx:  02/03/18, #90 Next OV:  none

## 2018-03-09 NOTE — Telephone Encounter (Signed)
Copied from [place] [phone]. Topic: Quick Communication - Rx Refill/Question >> Mar 09, 2018  9:30 AM Clack, [name] wrote: Medication: HYDROcodone-acetaminophen (NORCO) 10-325 MG tablet [[id number]]  ENDED (adv pt wife medication stated ended) Has the patient contacted their pharmacy? Yes.   (Agent: If no, request that the patient contact the pharmacy for the refill.) Preferred Pharmacy (with phone number or street name): [id number] - [place], [place] [phone] (Phone) [phone] (Fax)     Agent: Please be advised that RX refills may take up to 3 business days. We ask that you follow-up with your pharmacy.

## 2018-03-11 MED ORDER — HYDROCODONE-ACETAMINOPHEN 10-325 MG PO TABS: 1.0000 | ORAL_TABLET | Freq: Three times a day (TID) | ORAL | 0 refills | Status: DC | PRN

## 2018-03-11 NOTE — Telephone Encounter (Signed)
Copied from [place] [phone]. Topic: Quick Communication - Rx Refill/Question >> Mar 09, 2018  9:30 AM Clack, [name] wrote: Medication: HYDROcodone-acetaminophen (NORCO) 10-325 MG tablet [[id number]]  ENDED (adv pt wife medication stated ended) Has the patient contacted their pharmacy? Yes.   (Agent: If no, request that the patient contact the pharmacy for the refill.) Preferred Pharmacy (with phone number or street name): [facility] - [place], [place] [phone] (Phone) [phone] (Fax)     Agent: Please be advised that RX refills may take up to 3 business days. We ask that you follow-up with your pharmacy. >> Mar 11, 2018  8:47 AM Darl Householder, RMA wrote: Patient calling to check status of medication refill request, please return pt call

## 2018-03-11 NOTE — Telephone Encounter (Signed)
Pt was notified rx was sent to pharmacy

## 2018-03-11 NOTE — Telephone Encounter (Signed)
E prescribed. plz notify patient.  

## 2018-03-15 ENCOUNTER — Other Ambulatory Visit: Payer: Self-pay | Admitting: Family Medicine

## 2018-03-16 NOTE — Telephone Encounter (Signed)
Last filled:  12/31/17, #180 Last OV:  02/03/18 Next OV:  none

## 2018-03-23 ENCOUNTER — Other Ambulatory Visit: Payer: Self-pay | Admitting: Family Medicine

## 2018-03-29 ENCOUNTER — Ambulatory Visit (INDEPENDENT_AMBULATORY_CARE_PROVIDER_SITE_OTHER): Payer: Medicare Other

## 2018-03-29 DIAGNOSIS — I4891 Unspecified atrial fibrillation: Secondary | ICD-10-CM

## 2018-03-29 DIAGNOSIS — Z5181 Encounter for therapeutic drug level monitoring: Secondary | ICD-10-CM

## 2018-03-29 LAB — POCT INR: INR: 2.8

## 2018-03-29 NOTE — Patient Instructions (Signed)
Please continue taking 1/2 tablet every day except 1 tablet on Mondays, Wednesdays and Fridays.  Recheck in 4 weeks 

## 2018-04-05 ENCOUNTER — Other Ambulatory Visit: Payer: Self-pay | Admitting: Family Medicine

## 2018-04-05 DIAGNOSIS — G894 Chronic pain syndrome: Secondary | ICD-10-CM

## 2018-04-05 NOTE — Telephone Encounter (Signed)
Copied from [place] [phone]. Topic: Quick Communication - Rx Refill/Question >> Apr 05, 2018  9:45 AM Oliver Pila B wrote: Medication: HYDROcodone-acetaminophen (NORCO) 10-325 MG tablet [[id number], pt has enough for 4 more days Has the patient contacted their pharmacy? Yes.   (Agent: If no, request that the patient contact the pharmacy for the refill.) [facility] phone number [facility] Agent: Please be advised that RX refills may take up to 3 business days. We ask that you follow-up with your pharmacy.

## 2018-04-05 NOTE — Telephone Encounter (Signed)
Refill request for Hydrocodone  Last filled on 03/11/18 #90  LOV: 02/03/18  PCP: Dr. Adelfa Koh in [place], [place]

## 2018-04-06 MED ORDER — HYDROCODONE-ACETAMINOPHEN 10-325 MG PO TABS: 1.0000 | ORAL_TABLET | Freq: Three times a day (TID) | ORAL | 0 refills | Status: DC | PRN

## 2018-04-06 NOTE — Telephone Encounter (Signed)
E prescribed. [place] CSRS reviewed 01/2018.

## 2018-04-22 ENCOUNTER — Telehealth: Payer: Self-pay | Admitting: Family Medicine

## 2018-04-22 NOTE — Telephone Encounter (Signed)
Copied from [place] [phone]. Topic: Quick Communication - See Telephone Encounter >> Apr 22, 2018  3:03 PM Ether Griffins B wrote: CRM for notification. See Telephone encounter for: 04/22/18.  Pts wife calling in regards to her husband who is experiencing diarrhea when taking metformin. He has been taking it due to his blood sugar being elevated between 200-250. He is needing something else prescribed. Please send to [facility] [id number] - [place], [place]

## 2018-04-23 ENCOUNTER — Other Ambulatory Visit: Payer: Self-pay | Admitting: Cardiovascular Disease

## 2018-04-23 NOTE — Telephone Encounter (Signed)
I spoke with pt's wife and she mentioned that pt is taking Amlodipine 5 mg tablet when he needs it. She mentioned his top systolic has been running a bit high. Please advise if ok to refill due to medication not being on his med list since 11/18.

## 2018-04-25 ENCOUNTER — Other Ambulatory Visit: Payer: Self-pay | Admitting: Cardiovascular Disease

## 2018-04-26 ENCOUNTER — Ambulatory Visit (INDEPENDENT_AMBULATORY_CARE_PROVIDER_SITE_OTHER): Payer: Medicare Other

## 2018-04-26 DIAGNOSIS — Z5181 Encounter for therapeutic drug level monitoring: Secondary | ICD-10-CM | POA: Diagnosis not present

## 2018-04-26 DIAGNOSIS — I4891 Unspecified atrial fibrillation: Secondary | ICD-10-CM | POA: Diagnosis not present

## 2018-04-26 LAB — POCT INR: INR: 2.7

## 2018-04-26 MED ORDER — METFORMIN HCL ER 500 MG PO TB24: 500.0000 mg | ORAL_TABLET | Freq: Every day | ORAL | 1 refills | Status: DC

## 2018-04-26 MED ORDER — METFORMIN HCL ER 500 MG PO TB24: 500.0000 mg | ORAL_TABLET | Freq: Every day | ORAL | 6 refills | Status: DC

## 2018-04-26 NOTE — Telephone Encounter (Signed)
So at his last office visit Dr. Rockey Situ advised for him to take the metoprolol 25 mg at 5 pm daily. Refill sent in in March with refills.

## 2018-04-26 NOTE — Telephone Encounter (Signed)
I want him to try extended release metformin - sent to pharmacy to take in place of his regular metformin. This may be better tolerated regarding diarrhea.  Update [place] with effect after 2 weeks He is also overdue for diabetes check - plz offer appt

## 2018-04-26 NOTE — Telephone Encounter (Signed)
Can you please verify if the patient is taking the Metoprolol Tart 25 mg one tablet twice a day.  Dr. Donivan Scull last office note says to take Metoprolol Tart 25 mg one tablet at 5 pm.  The patient apparently is not clear on how or if should be taking the Metoprolol from previously telephone calls in March 2019. Please contact patient with instructions.

## 2018-04-26 NOTE — Patient Instructions (Signed)
Please continue taking 1/2 tablet every day except 1 tablet on Mondays, Wednesdays and Fridays.  Recheck in 5 weeks.

## 2018-04-26 NOTE — Telephone Encounter (Signed)
Spoke with pt relaying instructions and message per Dr. Darnell Level.  Pt verbalizes understanding and says he will have to call back to schedule DM f/u.

## 2018-04-30 ENCOUNTER — Other Ambulatory Visit: Payer: Self-pay | Admitting: Family Medicine

## 2018-04-30 NOTE — Telephone Encounter (Signed)
Last filled:  04/03/18, #120 Last OV:  02/03/18 Next OV:  none

## 2018-05-10 ENCOUNTER — Other Ambulatory Visit: Payer: Self-pay | Admitting: Family Medicine

## 2018-05-10 DIAGNOSIS — G894 Chronic pain syndrome: Secondary | ICD-10-CM

## 2018-05-10 MED ORDER — HYDROCODONE-ACETAMINOPHEN 10-325 MG PO TABS: 1.0000 | ORAL_TABLET | Freq: Three times a day (TID) | ORAL | 0 refills | Status: DC | PRN

## 2018-05-10 NOTE — Telephone Encounter (Signed)
Eprescribed.

## 2018-05-10 NOTE — Telephone Encounter (Signed)
Copied from [place] [phone]. Topic: Quick Communication - Rx Refill/Question >> May 10, 2018  1:27 PM Lennox Solders wrote: Medication: hydrocodone  Has the patient contacted their pharmacy? No (Agent: If no, request that the patient contact the pharmacy for the refill.) (Agent: If yes, when and what did the pharmacy advise?)  Preferred Pharmacy (with phone number or street name): [address] in [place] Agent: Please be advised that RX refills may take up to 3 business days. We ask that you follow-up with your pharmacy.

## 2018-05-10 NOTE — Telephone Encounter (Signed)
Last Rx 4/30. Last Ov 02/03/2018

## 2018-05-17 ENCOUNTER — Other Ambulatory Visit: Payer: Self-pay | Admitting: Cardiovascular Disease

## 2018-05-17 NOTE — Telephone Encounter (Signed)
Please review for refill, Thanks !  

## 2018-05-31 DIAGNOSIS — Z006 Encounter for examination for normal comparison and control in clinical research program: Secondary | ICD-10-CM | POA: Diagnosis not present

## 2018-05-31 DIAGNOSIS — Z95828 Presence of other vascular implants and grafts: Secondary | ICD-10-CM | POA: Diagnosis not present

## 2018-05-31 DIAGNOSIS — I716 Thoracoabdominal aortic aneurysm, without rupture: Secondary | ICD-10-CM | POA: Diagnosis not present

## 2018-05-31 DIAGNOSIS — K4090 Unilateral inguinal hernia, without obstruction or gangrene, not specified as recurrent: Secondary | ICD-10-CM | POA: Diagnosis not present

## 2018-05-31 DIAGNOSIS — R590 Localized enlarged lymph nodes: Secondary | ICD-10-CM | POA: Diagnosis not present

## 2018-06-01 ENCOUNTER — Telehealth: Payer: Self-pay

## 2018-06-01 DIAGNOSIS — N4289 Other specified disorders of prostate: Secondary | ICD-10-CM

## 2018-06-01 DIAGNOSIS — R972 Elevated prostate specific antigen [PSA]: Secondary | ICD-10-CM

## 2018-06-01 DIAGNOSIS — N400 Enlarged prostate without lower urinary tract symptoms: Secondary | ICD-10-CM

## 2018-06-01 DIAGNOSIS — K8689 Other specified diseases of pancreas: Secondary | ICD-10-CM | POA: Insufficient documentation

## 2018-06-01 NOTE — Telephone Encounter (Signed)
[facility] CTA - prostate finding: enhancing region R peripheral zone of prostate with capsular bulge. Will ask to scan expedited.   Plz call patient - I reviewed CT from [facility] showing area at right prostate lobe that had enlarged - may be new prostate mass or abscess - this was a new finding since our last CT scan done 01/2018.   I recommend urology f/u - he had to reschedule 3/1 appt to 5/1 - but did he keep this? Recommend we schedule urology f/u - referral placed. Please encourage him to keep this appointment.   Lab Results  Component Value Date   PSA 11.51 (H) 07/16/2016   PSA 2.23 03/21/2010

## 2018-06-01 NOTE — Telephone Encounter (Signed)
Copied from [place] [phone]. Topic: General - Other >> Jun 01, 2018  3:38 PM Yvette Rack wrote: Reason for CRM: Sonia Baller a Nurse Practitioner with [facility] requests a call back to discuss abnormal findings on CT scan. Cb# [phone]

## 2018-06-01 NOTE — Telephone Encounter (Addendum)
I spoke with Sonia Baller NP at [facility] vascular surgery. Sonia Baller said CT scan to f/u aortic aneurysm was done at [facility] and report will be faxed to [place] [phone]; there was abnormal area in prostate and [facility] is deferring to PCP to manage or refer to urologist. Sonia Baller said pt is aware of report. FYI to DR G. The fax from [facility] has come and is in Dr Synthia Innocent in box. Please send for scanning after reviewing the CT report.

## 2018-06-02 ENCOUNTER — Telehealth: Payer: Self-pay | Admitting: Family Medicine

## 2018-06-02 ENCOUNTER — Other Ambulatory Visit: Payer: Self-pay | Admitting: Family Medicine

## 2018-06-02 NOTE — Telephone Encounter (Signed)
rec'd from [place] [facility] forwarded 4 pages to Dr. Ria Bush

## 2018-06-02 NOTE — Telephone Encounter (Signed)
Noted  

## 2018-06-02 NOTE — Telephone Encounter (Signed)
Last filled:  03/17/18, #180 Last OV:  02/03/18 Next OV:  06/07/18

## 2018-06-02 NOTE — Telephone Encounter (Signed)
Called patient and notified patient that Dr Danise Mina strongly encourages him to keep the Urology appointment on 06/11/18 with Dr Louis Meckel. He says he will go to this appointment

## 2018-06-06 ENCOUNTER — Other Ambulatory Visit: Payer: Self-pay | Admitting: Cardiovascular Disease

## 2018-06-07 ENCOUNTER — Ambulatory Visit: Payer: Medicare Other | Admitting: Family Medicine

## 2018-06-07 ENCOUNTER — Telehealth: Payer: Self-pay

## 2018-06-07 NOTE — Telephone Encounter (Signed)
Noted.  Will FYI to Dr. Darnell Level.

## 2018-06-07 NOTE — Telephone Encounter (Signed)
Copied from [place] [phone]. Topic: Quick Communication - Appointment Cancellation >> Jun 07, 2018  7:12 AM Lennox Solders wrote: Patient called to cancel appointment scheduled for  gutierrez Patient HAS rescheduled their appointment. 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

## 2018-06-09 ENCOUNTER — Encounter: Payer: Self-pay | Admitting: Family Medicine

## 2018-06-09 ENCOUNTER — Ambulatory Visit (INDEPENDENT_AMBULATORY_CARE_PROVIDER_SITE_OTHER): Payer: Medicare Other | Admitting: Family Medicine

## 2018-06-09 VITALS — BP 120/74 | HR 51 | Temp 98.4°F | Ht 67.0 in | Wt 221.2 lb

## 2018-06-09 DIAGNOSIS — I714 Abdominal aortic aneurysm, without rupture, unspecified: Secondary | ICD-10-CM

## 2018-06-09 DIAGNOSIS — E1140 Type 2 diabetes mellitus with diabetic neuropathy, unspecified: Secondary | ICD-10-CM

## 2018-06-09 DIAGNOSIS — E1165 Type 2 diabetes mellitus with hyperglycemia: Secondary | ICD-10-CM

## 2018-06-09 DIAGNOSIS — K8689 Other specified diseases of pancreas: Secondary | ICD-10-CM

## 2018-06-09 DIAGNOSIS — J029 Acute pharyngitis, unspecified: Secondary | ICD-10-CM

## 2018-06-09 DIAGNOSIS — R634 Abnormal weight loss: Secondary | ICD-10-CM | POA: Diagnosis not present

## 2018-06-09 DIAGNOSIS — G8929 Other chronic pain: Secondary | ICD-10-CM | POA: Diagnosis not present

## 2018-06-09 DIAGNOSIS — I716 Thoracoabdominal aortic aneurysm, without rupture, unspecified: Secondary | ICD-10-CM

## 2018-06-09 DIAGNOSIS — G894 Chronic pain syndrome: Secondary | ICD-10-CM | POA: Diagnosis not present

## 2018-06-09 DIAGNOSIS — I25118 Atherosclerotic heart disease of native coronary artery with other forms of angina pectoris: Secondary | ICD-10-CM

## 2018-06-09 DIAGNOSIS — IMO0002 Reserved for concepts with insufficient information to code with codable children: Secondary | ICD-10-CM

## 2018-06-09 DIAGNOSIS — N400 Enlarged prostate without lower urinary tract symptoms: Secondary | ICD-10-CM

## 2018-06-09 DIAGNOSIS — R972 Elevated prostate specific antigen [PSA]: Secondary | ICD-10-CM | POA: Diagnosis not present

## 2018-06-09 MED ORDER — AMOXICILLIN 875 MG PO TABS: 875.0000 mg | ORAL_TABLET | Freq: Two times a day (BID) | ORAL | 0 refills | Status: DC

## 2018-06-09 MED ORDER — HYDROCODONE-ACETAMINOPHEN 10-325 MG PO TABS: 1.0000 | ORAL_TABLET | Freq: Three times a day (TID) | ORAL | 0 refills | Status: DC | PRN

## 2018-06-09 NOTE — Progress Notes (Signed)
BP 120/74 (BP Location: Left Arm, Patient Position: Sitting, Cuff Size: Normal)   Pulse (!) 51   Temp 98.4 F (36.9 C) (Oral)   Ht 5\' 7"  (1.702 m)   Wt 221 lb 4 oz (100.4 kg)   SpO2 94%   BMI 34.65 kg/m    CC: ST Subjective:    Patient ID: [name], male    DOB: Jan 17, 1946, 72 y.o.   MRN: [id number]  HPI: [name] is a 72 y.o. male presenting on 06/09/2018 for Sore Throat (C/o sore throat for about 2 wks. Pt accompanied by wife, [name].) and Fatigue (Has had about 2 wks.)   About 2 weeks of sore throat, malaise, fatigue. PNDrainage. Appetite down. Some posterior occipital headache.  Hasn't tried anything for this yet.  No sick contacts at home.  Denies dyspnea, wheezing, chest pain, cough, fevers/chills, night sweats or swollen glands. No temporal headache.   Metformin XR 500mg  causing loss of taste - asks about different med.  Lab Results  Component Value Date   HGBA1C 8.6 (H) 06/09/2018    Reviewed recent CT scan from [facility] - apparently had scan but did not stay for office visit - CT showed enhancing region R peripheral zone of prostate with capsular bulge, also showed R fat and bladder and now appendix containing inguinal hernia.   Upcoming urology appt Friday with Dr Louis Meckel (06/11/2018) - overdue for f/u, advised he keep this appt for further eval of abnormality found on CT scan.  Relevant past medical, surgical, family and social history reviewed and updated as indicated. Interim medical history since our last visit reviewed. Allergies and medications reviewed and updated. Outpatient Medications Prior to Visit  Medication Sig Dispense Refill  . amiodarone (PACERONE) 200 MG tablet Take 1 tablet (200 mg total) by mouth 3 (three) times daily. 90 tablet 6  . amLODipine (NORVASC) 5 MG tablet TAKE 1 TABLET(5 MG) BY MOUTH DAILY AS NEEDED 90 tablet 0  . diltiazem (CARDIZEM) 30 MG tablet Take 30 mg by mouth daily as needed (TAKE 1 TABLET BY MOUTH EVERY DAY AS DIRECTED AS  NEEDED FOR FAST HEART RATE).    Marland Kitchen diltiazem (CARDIZEM) 30 MG tablet TAKE 1 TABLET BY MOUTH EVERY DAY AS DIRECTED AS NEEDED FOR FAST HEART RATE 90 tablet 1  . ferrous sulfate 325 (65 FE) MG tablet Take 1 tablet (325 mg total) by mouth daily.    . fish oil-omega-3 fatty acids 1000 MG capsule Take 2 g by mouth daily.     . furosemide (LASIX) 20 MG tablet TAKE 1 TABLET BY MOUTH AS NEEDED FOR FLUID 30 tablet 5  . gabapentin (NEURONTIN) 600 MG tablet Take 1 tablet (600 mg total) by mouth 2 (two) times daily. 180 tablet 1  . metFORMIN (GLUCOPHAGE XR) 500 MG 24 hr tablet Take 1 tablet (500 mg total) by mouth daily with breakfast. 30 tablet 6  . metoprolol tartrate (LOPRESSOR) 25 MG tablet Take 1 tablet (25 mg total) by mouth daily. 90 tablet 0  . omeprazole (PRILOSEC) 40 MG capsule TAKE 1 CAPSULE BY MOUTH EVERY DAY 90 capsule 2  . PROAIR HFA 108 (90 Base) MCG/ACT inhaler INHALE 2 PUFFS BY MOUTH EVERY 6 HOURS AS NEEDED FOR WHEEZING OR SHORTNESS OF BREATH 17 g 4  . tiZANidine (ZANAFLEX) 4 MG tablet TAKE 1 TABLET BY MOUTH TWICE DAILY AS NEEDED FOR MUSCLE SPASMS 180 tablet 0  . vitamin B-12 (CYANOCOBALAMIN) 1000 MCG tablet Take 1 tablet (1,000 mcg total) by  mouth daily.    Marland Kitchen warfarin (COUMADIN) 5 MG tablet TAKE AS DIRECTED BY COUMADIN CLINIC 100 tablet 0  . HYDROcodone-acetaminophen (NORCO) 10-325 MG tablet Take 1 tablet by mouth every 8 (eight) hours as needed for severe pain. 90 tablet 0  . metoprolol tartrate (LOPRESSOR) 25 MG tablet Take 1 tablet (25 mg total) by mouth as directed. Once daily at 5 pm 90 tablet 3   No facility-administered medications prior to visit.      Per HPI unless specifically indicated in ROS section below Review of Systems     Objective:    BP 120/74 (BP Location: Left Arm, Patient Position: Sitting, Cuff Size: Normal)   Pulse (!) 51   Temp 98.4 F (36.9 C) (Oral)   Ht 5\' 7"  (1.702 m)   Wt 221 lb 4 oz (100.4 kg)   SpO2 94%   BMI 34.65 kg/m   Wt Readings from Last 3  Encounters:  06/09/18 221 lb 4 oz (100.4 kg)  03/01/18 231 lb (104.8 kg)  02/03/18 228 lb (103.4 kg)    Physical Exam  Constitutional: He appears well-developed and well-nourished. No distress.  HENT:  Head: Normocephalic and atraumatic.  Right Ear: Hearing, tympanic membrane, external ear and ear canal normal.  Left Ear: Hearing, tympanic membrane, external ear and ear canal normal.  Nose: Mucosal edema present. No rhinorrhea. Right sinus exhibits no maxillary sinus tenderness and no frontal sinus tenderness. Left sinus exhibits no maxillary sinus tenderness and no frontal sinus tenderness.  Mouth/Throat: Uvula is midline and mucous membranes are normal. Posterior oropharyngeal edema and posterior oropharyngeal erythema present. No oropharyngeal exudate or tonsillar abscesses. No tonsillar exudate.  Eyes: Pupils are equal, round, and reactive to light. Conjunctivae and EOM are normal. No scleral icterus.  Neck: Normal range of motion. Neck supple.  Cardiovascular: Normal rate, regular rhythm, normal heart sounds and intact distal pulses.  No murmur heard. Pulmonary/Chest: Effort normal and breath sounds normal. No respiratory distress. He has no wheezes. He has no rales.  Abdominal: Soft. Bowel sounds are normal. There is no hepatosplenomegaly. There is no tenderness. There is no rebound and no CVA tenderness.  Lymphadenopathy:    He has no cervical adenopathy.  Skin: Skin is warm and dry. No rash noted.  Nursing note and vitals reviewed.  Results for orders placed or performed in visit on 06/09/18  Comprehensive metabolic panel  Result Value Ref Range   Glucose, Bld 176 (H) 65 - 99 mg/dL   BUN 21 7 - 25 mg/dL   Creat 1.34 (H) 0.70 - 1.18 mg/dL   BUN/Creatinine Ratio 16 6 - 22 (calc)   Sodium 141 135 - 146 mmol/L   Potassium 4.8 3.5 - 5.3 mmol/L   Chloride 106 98 - 110 mmol/L   CO2 29 20 - 32 mmol/L   Calcium 9.1 8.6 - 10.3 mg/dL   Total Protein 6.2 6.1 - 8.1 g/dL   Albumin 3.9  3.6 - 5.1 g/dL   Globulin 2.3 1.9 - 3.7 g/dL (calc)   AG Ratio 1.7 1.0 - 2.5 (calc)   Total Bilirubin 0.6 0.2 - 1.2 mg/dL   Alkaline phosphatase (APISO) 87 40 - 115 U/L   AST 14 10 - 35 U/L   ALT 10 9 - 46 U/L  PSA  Result Value Ref Range   PSA 9.7 (H) < OR = 4.0 ng/mL  CBC with Differential/Platelet  Result Value Ref Range   WBC 7.5 3.8 - 10.8 Thousand/uL   RBC 5.19  4.20 - 5.80 Million/uL   Hemoglobin 15.4 13.2 - 17.1 g/dL   HCT 45.5 38.5 - 50.0 %   MCV 87.7 80.0 - 100.0 fL   MCH 29.7 27.0 - 33.0 pg   MCHC 33.8 32.0 - 36.0 g/dL   RDW 14.1 11.0 - 15.0 %   Platelets 268 140 - 400 Thousand/uL   MPV 10.7 7.5 - 12.5 fL   Neutro Abs 4,568 1,500 - 7,800 cells/uL   Lymphs Abs 2,070 850 - 3,900 cells/uL   WBC mixed population 638 200 - 950 cells/uL   Eosinophils Absolute 188 15 - 500 cells/uL   Basophils Absolute 38 0 - 200 cells/uL   Neutrophils Relative % 60.9 %   Total Lymphocyte 27.6 %   Monocytes Relative 8.5 %   Eosinophils Relative 2.5 %   Basophils Relative 0.5 %  Hemoglobin A1c  Result Value Ref Range   Hgb A1c MFr Bld 8.6 (H) <5.7 % of total Hgb   Mean Plasma Glucose 200 (calc)   eAG (mmol/L) 11.1 (calc)      Assessment & Plan:   Problem List Items Addressed This Visit    Weight loss - Primary    10lb weight loss in last few months associated with general fatigue, malaise. workup unrevealing to date - labs, imaging studies.  Update labs today (CBC, CMP). See below.      Relevant Orders   Comprehensive metabolic panel (Completed)   CBC with Differential/Platelet (Completed)   Type 2 diabetes, uncontrolled, with neuropathy (HCC)    Endorses loss of taste sensation on metformin. Consider changing regimen. Update A1c.       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 See below.       Fatty pancreas    Reviewed       Enlarged prostate    Upcoming urology  appt for chronically elevated PSA now with new finding of enhancing and enlarged R prostate. Update PSA today.       Encounter for chronic pain management    [place] CSRS reviewed.  Hydrocodone refilled. UDS 09/2017, pain contract through our clinic.      Elevated PSA    See above. Elevated PSA for years, has not f/u with urology.       Relevant Orders   PSA (Completed)   Chronic pain syndrome (Chronic)   Relevant Medications   HYDROcodone-acetaminophen (NORCO) 10-325 MG tablet   Acute pharyngitis    Ongoing for 2 weeks, with significant erythema of oropharynx - will cover with amox course.  In long term smoker, advised to update if not improving for ENT referral for further evaluation.       AAA (abdominal aortic aneurysm) without rupture ([place])    Other Visit Diagnoses    Sore throat           Meds ordered this encounter  Medications  . amoxicillin (AMOXIL) 875 MG tablet    Sig: Take 1 tablet (875 mg total) by mouth 2 (two) times daily.    Dispense:  20 tablet    Refill:  0  . HYDROcodone-acetaminophen (NORCO) 10-325 MG tablet    Sig: Take 1 tablet by mouth every 8 (eight) hours as needed for severe pain.    Dispense:  90 tablet    Refill:  0   Orders Placed This Encounter  Procedures  . Comprehensive metabolic panel  . PSA  .  CBC with Differential/Platelet  . Hemoglobin A1c    Follow up plan: Return if symptoms worsen or fail to improve.  Ria Bush, MD

## 2018-06-09 NOTE — Patient Instructions (Addendum)
Labs today.  Start antibiotic for pharyngitis. If no better, let me know for referral to ENT.  Keep appointment with urologist on Friday.

## 2018-06-10 ENCOUNTER — Encounter: Payer: Self-pay | Admitting: Family Medicine

## 2018-06-10 DIAGNOSIS — J029 Acute pharyngitis, unspecified: Secondary | ICD-10-CM | POA: Insufficient documentation

## 2018-06-10 LAB — CBC WITH DIFFERENTIAL/PLATELET
Basophils Absolute: 38 cells/uL (ref 0–200)
Basophils Relative: 0.5 %
Eosinophils Absolute: 188 cells/uL (ref 15–500)
Eosinophils Relative: 2.5 %
HEMATOCRIT: 45.5 % (ref 38.5–50.0)
Hemoglobin: 15.4 g/dL (ref 13.2–17.1)
LYMPHS ABS: 2070 {cells}/uL (ref 850–3900)
MCH: 29.7 pg (ref 27.0–33.0)
MCHC: 33.8 g/dL (ref 32.0–36.0)
MCV: 87.7 fL (ref 80.0–100.0)
MPV: 10.7 fL (ref 7.5–12.5)
Monocytes Relative: 8.5 %
NEUTROS PCT: 60.9 %
Neutro Abs: 4568 cells/uL (ref 1500–7800)
Platelets: 268 10*3/uL (ref 140–400)
RBC: 5.19 10*6/uL (ref 4.20–5.80)
RDW: 14.1 % (ref 11.0–15.0)
Total Lymphocyte: 27.6 %
WBC: 7.5 10*3/uL (ref 3.8–10.8)
WBCMIX: 638 {cells}/uL (ref 200–950)

## 2018-06-10 LAB — COMPREHENSIVE METABOLIC PANEL
AG Ratio: 1.7 (calc) (ref 1.0–2.5)
ALBUMIN MSPROF: 3.9 g/dL (ref 3.6–5.1)
ALT: 10 U/L (ref 9–46)
AST: 14 U/L (ref 10–35)
Alkaline phosphatase (APISO): 87 U/L (ref 40–115)
BUN / CREAT RATIO: 16 (calc) (ref 6–22)
BUN: 21 mg/dL (ref 7–25)
CHLORIDE: 106 mmol/L (ref 98–110)
CO2: 29 mmol/L (ref 20–32)
CREATININE: 1.34 mg/dL — AB (ref 0.70–1.18)
Calcium: 9.1 mg/dL (ref 8.6–10.3)
GLOBULIN: 2.3 g/dL (ref 1.9–3.7)
GLUCOSE: 176 mg/dL — AB (ref 65–99)
Potassium: 4.8 mmol/L (ref 3.5–5.3)
Sodium: 141 mmol/L (ref 135–146)
TOTAL PROTEIN: 6.2 g/dL (ref 6.1–8.1)
Total Bilirubin: 0.6 mg/dL (ref 0.2–1.2)

## 2018-06-10 LAB — HEMOGLOBIN A1C
EAG (MMOL/L): 11.1 (calc)
Hgb A1c MFr Bld: 8.6 % of total Hgb — ABNORMAL HIGH (ref <5.7)
Mean Plasma Glucose: 200 (calc)

## 2018-06-10 LAB — PSA: PSA: 9.7 ng/mL — ABNORMAL HIGH (ref <=4.0)

## 2018-06-10 NOTE — Assessment & Plan Note (Signed)
Endorses loss of taste sensation on metformin. Consider changing regimen. Update A1c.

## 2018-06-10 NOTE — Assessment & Plan Note (Addendum)
Upcoming urology appt for chronically elevated PSA now with new finding of enhancing and enlarged R prostate. Update PSA today.

## 2018-06-10 NOTE — Assessment & Plan Note (Signed)
S/p EVAR 2012, reviewed recent CT with patient and wife. See below.

## 2018-06-10 NOTE — Assessment & Plan Note (Signed)
[place] CSRS reviewed.  Hydrocodone refilled. UDS 09/2017, pain contract through our clinic.

## 2018-06-10 NOTE — Assessment & Plan Note (Signed)
See above. Elevated PSA for years, has not f/u with urology.

## 2018-06-10 NOTE — Assessment & Plan Note (Addendum)
Ongoing for 2 weeks, with significant erythema of oropharynx - will cover with amox course.  In long term smoker, advised to update if not improving for ENT referral for further evaluation.

## 2018-06-10 NOTE — Assessment & Plan Note (Addendum)
Reviewed

## 2018-06-10 NOTE — Assessment & Plan Note (Addendum)
10lb weight loss in last few months associated with general fatigue, malaise. workup unrevealing to date - labs, imaging studies.  Update labs today (CBC, CMP). See below.

## 2018-06-11 DIAGNOSIS — R972 Elevated prostate specific antigen [PSA]: Secondary | ICD-10-CM | POA: Diagnosis not present

## 2018-06-11 DIAGNOSIS — N5201 Erectile dysfunction due to arterial insufficiency: Secondary | ICD-10-CM | POA: Diagnosis not present

## 2018-06-15 ENCOUNTER — Telehealth: Payer: Self-pay | Admitting: Cardiovascular Disease

## 2018-06-15 NOTE — Telephone Encounter (Signed)
Routing to Dr Gollan. 

## 2018-06-15 NOTE — Telephone Encounter (Signed)
° °  [facility] Pre-operative Risk Assessment    Request for surgical clearance:  1. What type of surgery is being performed? Prostate Biopsy   2. When is this surgery scheduled? 07/19/18  3. What type of clearance is required (medical clearance vs. Pharmacy clearance to hold med vs. Both)? Both   4. Are there any medications that need to be held prior to surgery and how long? Coumadin 5 days prior   5. Practice name and name of physician performing surgery?  [facility] specialist Dr Louis Meckel  6. What is your office phone number [phone]   7.   What is your office fax number [phone]   8.   Anesthesia type (None, local, MAC, general) ? Not listed

## 2018-06-16 NOTE — Telephone Encounter (Signed)
Patient with diagnosis of Afib on warfarin for anticoagulation.    Procedure: Prostate  Date of procedure: 07/19/18  CHADS2-VASc score of  5 (CHF, HTN, AGE, DM2, stroke/tia x 2, CAD, AGE, male)  Per office protocol, patient can hold wafarin for 5 days prior to procedure.   Patient will not need bridging with Lovenox (enoxaparin) around procedure.  If not bridging, patient should restart warfarin on the evening of procedure or day after, at discretion of procedure MD  Of note patient is over due to be seen in Coumadin clinic. Will call today to make appt.

## 2018-06-16 NOTE — Telephone Encounter (Signed)
Spoke with pt wife and made appt for 06/21/18 for INR check.

## 2018-06-16 NOTE — Telephone Encounter (Signed)
LMOM to schedule f/u INR check prior to procedure.

## 2018-06-19 NOTE — Telephone Encounter (Signed)
Moderate but acceptable risk for procedure and to hold anticoagulation

## 2018-06-21 ENCOUNTER — Ambulatory Visit (INDEPENDENT_AMBULATORY_CARE_PROVIDER_SITE_OTHER): Payer: Medicare Other

## 2018-06-21 DIAGNOSIS — I4891 Unspecified atrial fibrillation: Secondary | ICD-10-CM | POA: Diagnosis not present

## 2018-06-21 DIAGNOSIS — Z5181 Encounter for therapeutic drug level monitoring: Secondary | ICD-10-CM | POA: Diagnosis not present

## 2018-06-21 LAB — POCT INR: INR: 3.5 — AB (ref 2.0–3.0)

## 2018-06-21 NOTE — Telephone Encounter (Signed)
Pt aware to hold coumadin x 5 days prior to colonoscopy on 07/19/18. He has appt for INR check on 8/5 - I will review instructions w/ him again at that time and provide them in writing.

## 2018-06-21 NOTE — Patient Instructions (Signed)
Please skip coumadin today, then continue taking 1/2 tablet every day except 1 tablet on Mondays, Wednesdays and Fridays.  Recheck in 3 weeks. Ok to hold coumadin x 5 days prior to colonoscopy on 8/12 - will give instructions at next coumadin check 8/5.

## 2018-06-24 ENCOUNTER — Telehealth: Payer: Self-pay | Admitting: Cardiovascular Disease

## 2018-06-24 NOTE — Telephone Encounter (Signed)
Routing to Dr Gollan. 

## 2018-06-24 NOTE — Telephone Encounter (Signed)
° °  [facility] Pre-operative Risk Assessment    Request for surgical clearance:  1. What type of surgery is being performed? Prostate Biopsy  2. When is this surgery scheduled? 07/19/18   3. What type of clearance is required (medical clearance vs. Pharmacy clearance to hold med vs. Both)? Pharmacy   4. Are there any medications that need to be held prior to surgery and how long? Coumadin 5 days prior   5. Practice name and name of physician performing surgery? Dr Louis Meckel [facility]    6. What is your office phone number [phone]   7.   What is your office fax number [phone]   8.   Anesthesia type (None, local, MAC, general) ? Not listed

## 2018-06-26 NOTE — Telephone Encounter (Signed)
Acceptable risk to come off the Coumadin for 5 days prior to procedure Low but acceptable risk of TIA or stroke Would restart anticoagulation when approved by surgeon following the procedure

## 2018-06-28 NOTE — Telephone Encounter (Signed)
Attempted to contact Caryl Pina - left message on her vm advisig her of Dr. Donivan Scull recommendation and that I will provide pt w/ written instructions at his next INR check on 07/12/18. Asked her to call back w/ any questions or concerns.

## 2018-06-28 NOTE — Telephone Encounter (Signed)
Spoke w/ Caryl Pina. She is appreciative of the call and asks that clearance be faxed to their office. Routed to her attention @ [phone].

## 2018-06-28 NOTE — Telephone Encounter (Signed)
Pt has INR check on 8/5.   I will provide him w/ written instructions on when to hold & resume coumadin at that time.

## 2018-06-28 NOTE — Telephone Encounter (Signed)
[name] from [facility] calling to check on status Patient surgery coming up on 8/12 Please advise

## 2018-07-02 ENCOUNTER — Other Ambulatory Visit: Payer: Self-pay | Admitting: Family Medicine

## 2018-07-02 NOTE — Telephone Encounter (Signed)
Nitroglycerin spray Last filled:  05/03/18, #12 g/3 Last OV:  06/09/18 Next OV:  none

## 2018-07-05 ENCOUNTER — Other Ambulatory Visit: Payer: Self-pay | Admitting: Family Medicine

## 2018-07-11 ENCOUNTER — Other Ambulatory Visit: Payer: Self-pay | Admitting: Cardiovascular Disease

## 2018-07-12 ENCOUNTER — Other Ambulatory Visit: Payer: Self-pay | Admitting: Cardiovascular Disease

## 2018-07-12 ENCOUNTER — Ambulatory Visit (INDEPENDENT_AMBULATORY_CARE_PROVIDER_SITE_OTHER): Payer: Medicare Other

## 2018-07-12 DIAGNOSIS — Z5181 Encounter for therapeutic drug level monitoring: Secondary | ICD-10-CM | POA: Diagnosis not present

## 2018-07-12 DIAGNOSIS — I4891 Unspecified atrial fibrillation: Secondary | ICD-10-CM

## 2018-07-12 LAB — POCT INR: INR: 2.5 (ref 2.0–3.0)

## 2018-07-12 NOTE — Patient Instructions (Signed)
Please continue taking 1/2 tablet every day except 1 tablet on Mondays, Wednesdays and Fridays.  Ok to hold coumadin x 5 days prior to colonoscopy on 8/12.   If ok'd by GI, you may resume coumadin the night of your procedure - please take extra 1/2 tablet x 2 days. Recheck 2 weeks after colonoscopy.

## 2018-07-13 ENCOUNTER — Other Ambulatory Visit: Payer: Self-pay

## 2018-07-13 DIAGNOSIS — G894 Chronic pain syndrome: Secondary | ICD-10-CM

## 2018-07-13 NOTE — Telephone Encounter (Signed)
Name of Medication: Hydrocodone-APAP Name of Pharmacy: [facility] or Written Date and Quantity: 06/09/18, #90 Last Office Visit and Type: 06/09/18, f/u & acute Next Office Visit and Type: none Last Controlled Substance Agreement Date: 07/06/17 Last UDS: 07/06/17

## 2018-07-15 MED ORDER — HYDROCODONE-ACETAMINOPHEN 10-325 MG PO TABS: 1.0000 | ORAL_TABLET | Freq: Three times a day (TID) | ORAL | 0 refills | Status: DC | PRN

## 2018-07-15 NOTE — Telephone Encounter (Signed)
Eprescribed.

## 2018-07-21 NOTE — Telephone Encounter (Addendum)
Left detailed message on [name]'s vm advising that if pt is to proceed w/ prostate biopsy (previously noted to be colonoscopy, but this was error my part) on 8/23, he will need to hold coumadin x 5 days prior or they will not be able to proceed. Asked her to please call my # before 5:00 to verify that she understands instructions or to call [facility] if she is unable to call me today.  Provided her w/ contact #s to each office.

## 2018-07-21 NOTE — Telephone Encounter (Signed)
Spoke w/ [name] and advised her to have pt stop coumadin on Sunday (8/18) night in preparation for prostate biopsy on 8/23.  If ok'd by surgeon, he may resume coumadin on the night of procedure, w/ extra 1/2 tablet x 2 days (1.5 tabs on Friday, 1 tab on Sat) and keep INR recheck appt on Mon, 8/26. She states that pt is hesitant about going thru w/ procedure b/c he has been told that it will be very painful; she is unsure if pt will keep appt as scheduled, but she is "really pushing him to go through w/ it". Encouraged her to call [facility] if pt wishes to cancel or postpone procedure.  I have left Caryl Pina a vm message making her aware of this. Asked [name] to call back if we can be of further assistance.

## 2018-07-21 NOTE — Telephone Encounter (Signed)
Received call from Caryl Pina that pt had r/s colonoscopy from 8/12 to 8/23, but pt mentioned to her that he had not been told to hold his coumadin prior to procedure.  Advised her that I had gone over that at his last INR check and provided him w/ written instructions on when to hold & resume his coumadin.  Left message for pt's wife to call back so I can go over instructions w/ her.

## 2018-07-21 NOTE — Telephone Encounter (Signed)
Left message for [name] that I have been unable to reach pt or his wife, will continue to try to get in touch w/ them until 5:00.

## 2018-07-23 ENCOUNTER — Other Ambulatory Visit: Payer: Self-pay | Admitting: Cardiovascular Disease

## 2018-07-23 NOTE — Telephone Encounter (Signed)
Please advise if ok to refill. Amlodipine is on current med list but when I look to see when it was prescribed I can't find it or on any last OV note medication list.

## 2018-07-30 DIAGNOSIS — N410 Acute prostatitis: Secondary | ICD-10-CM | POA: Diagnosis not present

## 2018-07-30 DIAGNOSIS — N411 Chronic prostatitis: Secondary | ICD-10-CM | POA: Diagnosis not present

## 2018-07-30 DIAGNOSIS — R972 Elevated prostate specific antigen [PSA]: Secondary | ICD-10-CM | POA: Diagnosis not present

## 2018-08-02 ENCOUNTER — Ambulatory Visit (INDEPENDENT_AMBULATORY_CARE_PROVIDER_SITE_OTHER): Payer: Medicare Other

## 2018-08-02 DIAGNOSIS — Z5181 Encounter for therapeutic drug level monitoring: Secondary | ICD-10-CM

## 2018-08-02 DIAGNOSIS — I4891 Unspecified atrial fibrillation: Secondary | ICD-10-CM | POA: Diagnosis not present

## 2018-08-02 LAB — POCT INR: INR: 1.3 — AB (ref 2.0–3.0)

## 2018-08-02 NOTE — Patient Instructions (Signed)
Please take 2 tablets tonight, 1 tablet tomorrow, then resume taking 1/2 tablet every day except 1 tablet on Mondays, Wednesdays and Fridays.  Recheck in 2 weeks.

## 2018-08-09 ENCOUNTER — Other Ambulatory Visit: Payer: Self-pay | Admitting: Cardiovascular Disease

## 2018-08-10 NOTE — Telephone Encounter (Signed)
Please review Rx for amiodarone 200 mg tablets three times a day.  The last office note with Dr. Rockey Situ reads amiodarone 200 mg three times a day but Dr. Rockey Situ mentioned in his note to taper down the amiodarone to 200 mg twice a day. The Rx was not changed. Please review and correct.

## 2018-08-11 NOTE — Telephone Encounter (Signed)
Last AVS from Dr Rockey Situ said: "Please cut the amiodarone in  1/2 daily"  Routing to Dr Rockey Situ to clarify the exact dose and frequency patient should be taking.

## 2018-08-13 ENCOUNTER — Other Ambulatory Visit: Payer: Self-pay | Admitting: Family Medicine

## 2018-08-13 MED ORDER — TIZANIDINE HCL 4 MG PO TABS: 4.0000 mg | ORAL_TABLET | Freq: Three times a day (TID) | ORAL | 1 refills | Status: DC | PRN

## 2018-08-13 NOTE — Telephone Encounter (Signed)
Patient request that script be changed because he sometimes takes 3 a day. Last office visit 06/09/18 Last refill 06/02/18 #180

## 2018-08-15 ENCOUNTER — Other Ambulatory Visit: Payer: Self-pay | Admitting: Family Medicine

## 2018-08-15 DIAGNOSIS — G894 Chronic pain syndrome: Secondary | ICD-10-CM

## 2018-08-16 ENCOUNTER — Ambulatory Visit (INDEPENDENT_AMBULATORY_CARE_PROVIDER_SITE_OTHER): Payer: Medicare Other

## 2018-08-16 ENCOUNTER — Other Ambulatory Visit: Payer: Self-pay | Admitting: *Deleted

## 2018-08-16 DIAGNOSIS — Z5181 Encounter for therapeutic drug level monitoring: Secondary | ICD-10-CM | POA: Diagnosis not present

## 2018-08-16 DIAGNOSIS — I4891 Unspecified atrial fibrillation: Secondary | ICD-10-CM | POA: Diagnosis not present

## 2018-08-16 DIAGNOSIS — R972 Elevated prostate specific antigen [PSA]: Secondary | ICD-10-CM | POA: Diagnosis not present

## 2018-08-16 DIAGNOSIS — C61 Malignant neoplasm of prostate: Secondary | ICD-10-CM | POA: Diagnosis not present

## 2018-08-16 LAB — POCT INR: INR: 2.9 (ref 2.0–3.0)

## 2018-08-16 MED ORDER — AMIODARONE HCL 200 MG PO TABS: ORAL_TABLET | ORAL | 0 refills | Status: DC

## 2018-08-16 NOTE — Patient Instructions (Signed)
Please continue taking 1/2 tablet every day except 1 tablet on Mondays, Wednesdays and Fridays.  Recheck in 3 weeks.

## 2018-08-16 NOTE — Telephone Encounter (Signed)
Name of Medication: Hydrpcodone Name of Pharmacy: [facility] or Written Date and Quantity: 07/15/18 #90 Last Office Visit and Type: 06/19/18 /acute Next Office Visit and Type: None scheduled None scheduled Last Controlled Substance Agreement Date: 07/06/17 Last UDS:07/06/17 See allergy/contraindication

## 2018-08-17 MED ORDER — HYDROCODONE-ACETAMINOPHEN 10-325 MG PO TABS: 1.0000 | ORAL_TABLET | Freq: Three times a day (TID) | ORAL | 0 refills | Status: DC | PRN

## 2018-08-17 NOTE — Telephone Encounter (Signed)
I think my last AVS said try to take no more than amiodarone 200 twice daily Cannot see where I said cut anything in half We will try to keep the amiodarone 200 twice daily or less We can write a prescription for amiodarone 200 twice daily

## 2018-08-17 NOTE — Telephone Encounter (Signed)
Eprescribed.

## 2018-08-17 NOTE — Telephone Encounter (Signed)
Sending as a patient phone call 

## 2018-08-19 ENCOUNTER — Encounter: Payer: Self-pay | Admitting: Family Medicine

## 2018-08-23 ENCOUNTER — Other Ambulatory Visit: Payer: Self-pay | Admitting: Cardiovascular Disease

## 2018-08-23 NOTE — Telephone Encounter (Signed)
Please review for refill, Thanks !  

## 2018-09-06 ENCOUNTER — Other Ambulatory Visit: Payer: Self-pay | Admitting: Family Medicine

## 2018-09-06 NOTE — Telephone Encounter (Signed)
Electronic refill request Last office visit 06/09/18 Last refill 07/05/18   4.9 G/1 refill

## 2018-09-08 ENCOUNTER — Ambulatory Visit (INDEPENDENT_AMBULATORY_CARE_PROVIDER_SITE_OTHER): Payer: Medicare Other

## 2018-09-08 DIAGNOSIS — I4891 Unspecified atrial fibrillation: Secondary | ICD-10-CM | POA: Diagnosis not present

## 2018-09-08 DIAGNOSIS — Z5181 Encounter for therapeutic drug level monitoring: Secondary | ICD-10-CM | POA: Diagnosis not present

## 2018-09-08 LAB — POCT INR: INR: 1.9 — AB (ref 2.0–3.0)

## 2018-09-08 NOTE — Patient Instructions (Signed)
Please take extra 1/2 tablet tomorrow, then continue taking 1/2 tablet every day except 1 tablet on Mondays, Wednesdays and Fridays.  Recheck in 3 weeks.

## 2018-09-09 ENCOUNTER — Other Ambulatory Visit: Payer: Self-pay | Admitting: Family Medicine

## 2018-09-10 ENCOUNTER — Ambulatory Visit: Payer: Medicare Other | Admitting: Radiation Oncology

## 2018-09-14 ENCOUNTER — Other Ambulatory Visit: Payer: Self-pay | Admitting: Family Medicine

## 2018-09-15 NOTE — Telephone Encounter (Signed)
Gabapentin Last filled:  07/17/18 Last OV:  06/09/18 Next OV:  none

## 2018-09-28 ENCOUNTER — Other Ambulatory Visit: Payer: Self-pay

## 2018-09-28 ENCOUNTER — Ambulatory Visit
Admission: RE | Admit: 2018-09-28 | Discharge: 2018-09-28 | Disposition: A | Discharge status: Home / Self Care | Payer: Medicare Other | Source: Ambulatory Visit | Attending: Radiation Oncology | Admitting: Radiation Oncology

## 2018-09-28 ENCOUNTER — Encounter: Payer: Self-pay | Admitting: Radiation Oncology

## 2018-09-28 VITALS — BP 122/73 | HR 76 | Temp 98.0°F | Wt 218.5 lb

## 2018-09-28 DIAGNOSIS — F17210 Nicotine dependence, cigarettes, uncomplicated: Secondary | ICD-10-CM | POA: Diagnosis not present

## 2018-09-28 DIAGNOSIS — R350 Frequency of micturition: Secondary | ICD-10-CM | POA: Diagnosis not present

## 2018-09-28 DIAGNOSIS — K760 Fatty (change of) liver, not elsewhere classified: Secondary | ICD-10-CM | POA: Insufficient documentation

## 2018-09-28 DIAGNOSIS — F418 Other specified anxiety disorders: Secondary | ICD-10-CM | POA: Diagnosis not present

## 2018-09-28 DIAGNOSIS — M542 Cervicalgia: Secondary | ICD-10-CM | POA: Diagnosis not present

## 2018-09-28 DIAGNOSIS — G4730 Sleep apnea, unspecified: Secondary | ICD-10-CM | POA: Diagnosis not present

## 2018-09-28 DIAGNOSIS — Z79899 Other long term (current) drug therapy: Secondary | ICD-10-CM | POA: Diagnosis not present

## 2018-09-28 DIAGNOSIS — J449 Chronic obstructive pulmonary disease, unspecified: Secondary | ICD-10-CM | POA: Diagnosis not present

## 2018-09-28 DIAGNOSIS — N529 Male erectile dysfunction, unspecified: Secondary | ICD-10-CM | POA: Insufficient documentation

## 2018-09-28 DIAGNOSIS — R3915 Urgency of urination: Secondary | ICD-10-CM | POA: Insufficient documentation

## 2018-09-28 DIAGNOSIS — I714 Abdominal aortic aneurysm, without rupture: Secondary | ICD-10-CM | POA: Insufficient documentation

## 2018-09-28 DIAGNOSIS — E785 Hyperlipidemia, unspecified: Secondary | ICD-10-CM | POA: Insufficient documentation

## 2018-09-28 DIAGNOSIS — E1143 Type 2 diabetes mellitus with diabetic autonomic (poly)neuropathy: Secondary | ICD-10-CM | POA: Diagnosis not present

## 2018-09-28 DIAGNOSIS — I4891 Unspecified atrial fibrillation: Secondary | ICD-10-CM | POA: Insufficient documentation

## 2018-09-28 DIAGNOSIS — I509 Heart failure, unspecified: Secondary | ICD-10-CM | POA: Diagnosis not present

## 2018-09-28 DIAGNOSIS — C61 Malignant neoplasm of prostate: Secondary | ICD-10-CM | POA: Diagnosis not present

## 2018-09-28 NOTE — Consult Note (Signed)
NEW PATIENT EVALUATION  Name: [name]  MRN: [id number]  Date:   09/28/2018     DOB: 1946/07/15   This 72 y.o. 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  REFERRING PHYSICIAN: Ria Bush, MD  CHIEF COMPLAINT:  Chief Complaint  Patient presents with  . Prostate Cancer    Initial Eval    DIAGNOSIS: The encounter diagnosis was Prostate cancer ([place]).   PREVIOUS INVESTIGATIONS:  Pathology reports reviewed Clinical notes reviewed  HPI: patient is a 72 year old male who is been followed for an elevated PSA which jumped approximately 2 years ago from 2.2-11.5. 3 months ago it was 9.7 prompting transrectal ultrasound-guided biopsy showing 1 core out of 12 positive for Gleason 7 (4+3). His estimatedprostate volume was 106 cc. He does have some increased your lower urinary tract symptoms including frequency and urgency. Patient was comorbidities including stenting of the previous 6.5 cm thoracic aortic aneurysm also has a history of COPD. He is accompanied by his wife whose brother is a former patient of mine. He is now referred to radiation oncology for opinion.  PLANNED TREATMENT REGIMEN: image guided I MRT radiation therapy  PAST MEDICAL HISTORY:  has a past medical history of AAA (abdominal aortic aneurysm) ([place]) (2013), Abnormal drug screen (06/2015), Cervical neck pain with evidence of disc disease (07/2011), CHF (congestive heart failure) ([place]), COPD (chronic obstructive pulmonary disease) ([place]), Depression, ED (erectile dysfunction) (02/2012), Fatty liver, GERD (gastroesophageal reflux disease), Hyperlipidemia, Leg cramps, Muscle spasm, Neuralgia, Obesity, Olecranon bursitis of left elbow (09/01/2014), OSA (obstructive sleep apnea) (03/2012), Osteoarthritis, Paroxysmal atrial fibrillation ([place]), Peripheral autonomic neuropathy due to diabetes mellitus ([employer]), Prostate  cancer ([place]) (12/18/2017), Right shoulder injury (05/2012), Smoker, and T2DM (type 2 diabetes mellitus) ([place]).    PAST SURGICAL HISTORY:  Past Surgical History:  Procedure Laterality Date  . CHOLECYSTECTOMY  2001  . CTA abd  09/2011   6.1cm AAA, bilateral ing hernias, R with bladder wall, promient prostate calcifications  . ENDOVASCULAR STENT INSERTION  11/11/2011   Procedure: ENDOVASCULAR STENT GRAFT INSERTION;  Surgeon: Angelia Mould, MD;  Location: [facility];  Service: Vascular;  Laterality: N/A;  aorta bi iliac  . KNEE SURGERY     L side cartilage taken out  . PFTs  12/2010   mod-severe obstruction, ?bronchodilator response  . TONSILLECTOMY      FAMILY HISTORY: family history includes Arthritis in his mother; Coronary artery disease in his father; Heart disease in his father; Leukemia in his father; Melanoma in his sister.  SOCIAL HISTORY:  reports that he has been smoking cigarettes. He has a 52.00 pack-year smoking history. He has never used smokeless tobacco. He reports that he drinks about 7.0 standard drinks of alcohol per week. He reports that he does not use drugs.  ALLERGIES: Oxycodone hcl; Metformin and related; Varenicline tartrate; Varenicline tartrate; Wellbutrin [bupropion hcl]; and Zocor [simvastatin]  MEDICATIONS:  Current Outpatient Medications  Medication Sig Dispense Refill  . amiodarone (PACERONE) 200 MG tablet TAKE 1 TABLET(200 MG) BY MOUTH TWICE DAILY 180 tablet 3  . amiodarone (PACERONE) 200 MG tablet TAKE 1 TABLET(200 MG) BY MOUTH TWICE DAILY 60 tablet 0  . amLODipine (NORVASC) 5 MG tablet TAKE 1 TABLET(5 MG) BY MOUTH DAILY AS NEEDED 90 tablet 0  . amoxicillin (AMOXIL) 875 MG tablet Take 1 tablet (875 mg total) by mouth 2 (two) times daily. 20 tablet  0  . diltiazem (CARDIZEM) 30 MG tablet Take 30 mg by mouth daily as needed (TAKE 1 TABLET BY MOUTH EVERY DAY AS DIRECTED AS NEEDED FOR FAST HEART RATE).    Marland Kitchen diltiazem (CARDIZEM) 30 MG tablet TAKE 1 TABLET BY  MOUTH EVERY DAY AS DIRECTED AS NEEDED FOR FAST HEART RATE 90 tablet 1  . ferrous sulfate 325 (65 FE) MG tablet Take 1 tablet (325 mg total) by mouth daily.    . fish oil-omega-3 fatty acids 1000 MG capsule Take 2 g by mouth daily.     . furosemide (LASIX) 20 MG tablet TAKE 1 TABLET BY MOUTH AS NEEDED FOR FLUID 30 tablet 5  . gabapentin (NEURONTIN) 600 MG tablet TAKE 1 TABLET(600 MG) BY MOUTH TWICE DAILY 180 tablet 0  . HYDROcodone-acetaminophen (NORCO) 10-325 MG tablet Take 1 tablet by mouth every 8 (eight) hours as needed for severe pain. 90 tablet 0  . metFORMIN (GLUCOPHAGE XR) 500 MG 24 hr tablet Take 1 tablet (500 mg total) by mouth daily with breakfast. 30 tablet 6  . metoprolol tartrate (LOPRESSOR) 25 MG tablet Take 1 tablet (25 mg total) by mouth as directed. Once daily at 5 pm 90 tablet 3  . metoprolol tartrate (LOPRESSOR) 25 MG tablet Take 1 tablet (25 mg total) by mouth daily. 90 tablet 0  . nitroGLYCERIN (NITROLINGUAL) 0.4 MG/SPRAY spray USE 1 SPRAY AS DIRECTED EVERY 5 MINUTES AS NEEDED 4.9 g 1  . omeprazole (PRILOSEC) 40 MG capsule TAKE 1 CAPSULE BY MOUTH EVERY DAY 90 capsule 2  . PROAIR HFA 108 (90 Base) MCG/ACT inhaler INHALE 2 PUFFS BY MOUTH EVERY 6 HOURS AS NEEDED FOR WHEEZING OR SHORTNESS OF BREATH 17 g 4  . tiZANidine (ZANAFLEX) 4 MG tablet Take 1 tablet (4 mg total) by mouth every 8 (eight) hours as needed for muscle spasms. 250 tablet 1  . vitamin B-12 (CYANOCOBALAMIN) 1000 MCG tablet Take 1 tablet (1,000 mcg total) by mouth daily.    Marland Kitchen warfarin (COUMADIN) 5 MG tablet TAKE AS DIRECTED BY COUMADIN CLINIC 100 tablet 0   No current facility-administered medications for this encounter.     ECOG PERFORMANCE STATUS:  1 - Symptomatic but completely ambulatory  REVIEW OF SYSTEMS: except for the urinary symptoms. Patient denies any weight loss, fatigue, weakness, fever, chills or night sweats. Patient denies any loss of vision, blurred vision. Patient denies any ringing  of the ears  or hearing loss. No irregular heartbeat. Patient denies heart murmur or history of fainting. Patient denies any chest pain or pain radiating to her upper extremities. Patient denies any shortness of breath, difficulty breathing at night, cough or hemoptysis. Patient denies any swelling in the lower legs. Patient denies any nausea vomiting, vomiting of blood, or coffee ground material in the vomitus. Patient denies any stomach pain. Patient states has had normal bowel movements no significant constipation or diarrhea. Patient denies any dysuria, hematuria or significant nocturia. Patient denies any problems walking, swelling in the joints or loss of balance. Patient denies any skin changes, loss of hair or loss of weight. Patient denies any excessive worrying or anxiety or significant depression. Patient denies any problems with insomnia. Patient denies excessive thirst, polyuria, polydipsia. Patient denies any swollen glands, patient denies easy bruising or easy bleeding. Patient denies any recent infections, allergies or URI. Patient "s visual fields have not changed significantly in recent time.    PHYSICAL EXAM: BP 122/73 (BP Location: Left Arm, Patient Position: Sitting)   Pulse 76  Temp 98 F (36.7 C) (Tympanic)   Wt 218 lb 7.6 oz (99.1 kg)   BMI 34.22 kg/m  Well-developed well-nourished patient in NAD. HEENT reveals PERLA, EOMI, discs not visualized.  Oral cavity is clear. No oral mucosal lesions are identified. Neck is clear without evidence of cervical or supraclavicular adenopathy. Lungs are clear to A&P. Cardiac examination is essentially unremarkable with regular rate and rhythm without murmur rub or thrill. Abdomen is benign with no organomegaly or masses noted. Motor sensory and DTR levels are equal and symmetric in the upper and lower extremities. Cranial nerves II through XII are grossly intact. Proprioception is intact. No peripheral adenopathy or edema is identified. No motor or sensory  levels are noted. Crude visual fields are within normal range.  LABORATORY DATA: pathology reports reviewed    RADIOLOGY RESULTS:no current films for review   IMPRESSION: stage IIa Gleason 7 (4+3) adenocarcinoma the prostate in 72 year old male  PLAN: I have run the [facility] nomogram on the patient. He has only a 45% chance of organ confined disease with only a 5% chance of lymph node involvement and 4% chance of seminal vesicle involvement. I've had a long discussion with the patient and his wife about treatment options and I would favor image guided I MRT radiation therapy up to 8000 cGy over 8 weeks. Patient also would possibly benefit from short-term androgen deprivation therapy. Patient has consented to go fold with treatments. Risks and benefits of treatment including increased lower urinary tract symptoms diarrhea fatigue alteration of blood counts and skin reaction all were discussed in detail. I'm sending him back to urology for placement of fiduciary markers for daily image guided treatment. Also feelthat androgen deprivation therapy with Lupron would be advisable although the patient may not agree to that. We'll set up treatment planning simulation shortly after his markers are placed. Patient is somewhat reluctant on treatment although I believe he will go ahead with treatmentas described above.both he and his wife comprehend my treatment plan well.  I would like to take this opportunity to thank you for allowing me to participate in the care of your patient.Noreene Filbert, MD

## 2018-09-29 ENCOUNTER — Ambulatory Visit (INDEPENDENT_AMBULATORY_CARE_PROVIDER_SITE_OTHER): Payer: Medicare Other

## 2018-09-29 ENCOUNTER — Other Ambulatory Visit: Payer: Self-pay | Admitting: Family Medicine

## 2018-09-29 DIAGNOSIS — G894 Chronic pain syndrome: Secondary | ICD-10-CM

## 2018-09-29 DIAGNOSIS — Z5181 Encounter for therapeutic drug level monitoring: Secondary | ICD-10-CM

## 2018-09-29 DIAGNOSIS — I4891 Unspecified atrial fibrillation: Secondary | ICD-10-CM | POA: Diagnosis not present

## 2018-09-29 LAB — POCT INR: INR: 2.7 (ref 2.0–3.0)

## 2018-09-29 NOTE — Patient Instructions (Signed)
Please continue taking 1/2 tablet every day except 1 tablet on Mondays, Wednesdays and Fridays.  Recheck in 4 weeks

## 2018-09-29 NOTE — Telephone Encounter (Signed)
Name of Medication: Hydrocodone-APAP Name of Pharmacy: [facility] or Written Date and Quantity: 08/17/18, #90 Last Office Visit and Type: 06/09/18, f/u Next Office Visit and Type: none Last Controlled Substance Agreement Date: 07/06/17 Last UDS: 07/06/17

## 2018-09-30 MED ORDER — HYDROCODONE-ACETAMINOPHEN 10-325 MG PO TABS: 1.0000 | ORAL_TABLET | Freq: Three times a day (TID) | ORAL | 0 refills | Status: DC | PRN

## 2018-09-30 NOTE — Telephone Encounter (Signed)
E prescribed Please call and schedule 3 mo chronic pain office visit

## 2018-09-30 NOTE — Telephone Encounter (Signed)
Spoke with pt's wife, Barnetta Chapel (on dpr), notifying her pt's pain med was refilled. She also scheduled chronic pain visit for 10/04/18 at [facility].

## 2018-10-01 ENCOUNTER — Telehealth: Payer: Self-pay | Admitting: Cardiovascular Disease

## 2018-10-01 NOTE — Telephone Encounter (Signed)
Please call to advise if pt needs to stop coumadin prior to procedure(biopsy)  Radiation for prostate cancer for patient.

## 2018-10-01 NOTE — Telephone Encounter (Signed)
Spoke with Caryl Pina who states that the patient is going to be scheduled for a new procedure. She will fax clearance to our office for this procedure. Will plan to recheck INR prior to procedure.   Will route to [place] as FYI.

## 2018-10-04 ENCOUNTER — Ambulatory Visit: Payer: Medicare Other | Admitting: Family Medicine

## 2018-10-05 ENCOUNTER — Other Ambulatory Visit: Payer: Self-pay | Admitting: Family Medicine

## 2018-10-13 NOTE — Telephone Encounter (Signed)
Pt has appt w/ me for INR check on 11/20. I will review instructions w/ him at that time.

## 2018-10-13 NOTE — Telephone Encounter (Signed)
Patient scheduled 12/9 for [place] for cancer tx and will need to hold coumadin for 5 days prior per Caryl Pina [phone] ext [phone] at [facility] .  Call if questions or concerns .

## 2018-10-24 ENCOUNTER — Other Ambulatory Visit: Payer: Self-pay | Admitting: Family Medicine

## 2018-10-24 ENCOUNTER — Other Ambulatory Visit: Payer: Self-pay | Admitting: Cardiovascular Disease

## 2018-10-25 ENCOUNTER — Ambulatory Visit (INDEPENDENT_AMBULATORY_CARE_PROVIDER_SITE_OTHER): Payer: Medicare Other

## 2018-10-25 DIAGNOSIS — Z5181 Encounter for therapeutic drug level monitoring: Secondary | ICD-10-CM

## 2018-10-25 DIAGNOSIS — I4891 Unspecified atrial fibrillation: Secondary | ICD-10-CM | POA: Diagnosis not present

## 2018-10-25 LAB — POCT INR: INR: 2.3 (ref 2.0–3.0)

## 2018-10-25 NOTE — Patient Instructions (Signed)
Please continue taking 1/2 tablet every day except 1 tablet on Mondays, Wednesdays and Fridays.  Per oncology, hold coumadin for 5 days prior to your procedure on 12/9 - last dose will be 12/3 (don't take coumadin 12/4-12/8). If ok'd by performing MD, resume coumadin the night of your procedure and take extra 1/2 dose for 2 days after, then resume your previous dosage.   Recheck INR on 12/16.

## 2018-10-29 ENCOUNTER — Encounter

## 2018-10-29 ENCOUNTER — Ambulatory Visit: Payer: Medicare Other | Admitting: Family Medicine

## 2018-11-03 ENCOUNTER — Encounter: Payer: Self-pay | Admitting: Family Medicine

## 2018-11-03 ENCOUNTER — Ambulatory Visit (INDEPENDENT_AMBULATORY_CARE_PROVIDER_SITE_OTHER): Payer: Medicare Other | Admitting: Family Medicine

## 2018-11-03 VITALS — BP 124/68 | HR 80 | Temp 97.8°F | Ht 67.0 in | Wt 220.0 lb

## 2018-11-03 DIAGNOSIS — J449 Chronic obstructive pulmonary disease, unspecified: Secondary | ICD-10-CM

## 2018-11-03 DIAGNOSIS — I25118 Atherosclerotic heart disease of native coronary artery with other forms of angina pectoris: Secondary | ICD-10-CM | POA: Diagnosis not present

## 2018-11-03 DIAGNOSIS — C61 Malignant neoplasm of prostate: Secondary | ICD-10-CM

## 2018-11-03 DIAGNOSIS — G4733 Obstructive sleep apnea (adult) (pediatric): Secondary | ICD-10-CM

## 2018-11-03 DIAGNOSIS — I716 Thoracoabdominal aortic aneurysm, without rupture, unspecified: Secondary | ICD-10-CM

## 2018-11-03 DIAGNOSIS — M25512 Pain in left shoulder: Secondary | ICD-10-CM

## 2018-11-03 DIAGNOSIS — M25511 Pain in right shoulder: Secondary | ICD-10-CM

## 2018-11-03 DIAGNOSIS — G8929 Other chronic pain: Secondary | ICD-10-CM

## 2018-11-03 DIAGNOSIS — E1140 Type 2 diabetes mellitus with diabetic neuropathy, unspecified: Secondary | ICD-10-CM | POA: Diagnosis not present

## 2018-11-03 DIAGNOSIS — E1165 Type 2 diabetes mellitus with hyperglycemia: Secondary | ICD-10-CM

## 2018-11-03 DIAGNOSIS — G894 Chronic pain syndrome: Secondary | ICD-10-CM | POA: Diagnosis not present

## 2018-11-03 DIAGNOSIS — I4811 Longstanding persistent atrial fibrillation: Secondary | ICD-10-CM

## 2018-11-03 DIAGNOSIS — IMO0002 Reserved for concepts with insufficient information to code with codable children: Secondary | ICD-10-CM

## 2018-11-03 LAB — RENAL FUNCTION PANEL
Albumin: 3.8 g/dL (ref 3.5–5.2)
BUN: 15 mg/dL (ref 6–23)
CALCIUM: 8.7 mg/dL (ref 8.4–10.5)
CHLORIDE: 104 meq/L (ref 96–112)
CO2: 28 meq/L (ref 19–32)
Creatinine, Ser: 1.09 mg/dL (ref 0.40–1.50)
GFR: 70.19 mL/min (ref >60.00)
Glucose, Bld: 212 mg/dL — ABNORMAL HIGH (ref 70–99)
POTASSIUM: 4.2 meq/L (ref 3.5–5.1)
Phosphorus: 3.7 mg/dL (ref 2.3–4.6)
Sodium: 139 mEq/L (ref 135–145)

## 2018-11-03 LAB — HEMOGLOBIN A1C: HEMOGLOBIN A1C: 8.2 % — AB (ref 4.6–6.5)

## 2018-11-03 MED ORDER — HYDROCODONE-ACETAMINOPHEN 10-325 MG PO TABS: 1.0000 | ORAL_TABLET | Freq: Three times a day (TID) | ORAL | 0 refills | Status: DC | PRN

## 2018-11-03 MED ORDER — METFORMIN HCL ER 500 MG PO TB24: 1500.0000 mg | ORAL_TABLET | Freq: Every day | ORAL | 3 refills | Status: DC

## 2018-11-03 MED ORDER — METHYLPREDNISOLONE ACETATE 40 MG/ML IJ SUSP
40.0000 mg | Freq: Once | INTRAMUSCULAR | Status: AC
  Administered 2018-11-03: 40 mg via INTRAMUSCULAR

## 2018-11-03 NOTE — Assessment & Plan Note (Signed)
Chronic, on amiodarone and coumadin.

## 2018-11-03 NOTE — Assessment & Plan Note (Deleted)
Appreciate uro and rad onc care. Seems he has planned to undergo brachytherapy.

## 2018-11-03 NOTE — Assessment & Plan Note (Signed)
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 Now with acute worsening of left shoulder pain without endorsed injury/trauma. Exam today consistent with shoulder bursitis.  Requests steroid injection. Discussed risks/benefits of procedure, discussed monitoring sugar after steroid injection, discussed risks of tendon rupture, discussed risks of infection and red flags to seek care reviewed. Update if not improving after treatment for further evaluation.

## 2018-11-03 NOTE — Progress Notes (Signed)
BP 124/68 (BP Location: Left Arm, Patient Position: Sitting, Cuff Size: Normal)   Pulse 80   Temp 97.8 F (36.6 C) (Oral)   Ht 5\' 7"  (1.702 m)   Wt 220 lb (99.8 kg)   SpO2 90%   BMI 34.46 kg/m    CC: chronic pain management visit Subjective:    Patient ID: [name], male    DOB: [birth year], 72 y.o.   MRN: [id number]  HPI: GERMAINE SHENKER is a 72 y.o. male presenting on 11/03/2018 for Pain Management (Here for 3 mo pain f/u.)   On hydrocodone 10/325 #90/mo for chronic back pain.   Worsening L arm pain for weeks, worse with reaching forward. Shooting pain down arm into hand. Treating with salon pas patch and horse linement. Chronic neck pain, but doesn't think pain is coming from neck. Denies inciting trauma/falls. He states he has tolerated steroid injections well in the past, requests one to left shoulder today.  Recent dx prostate adenocarcinoma, saw Dr Baruch Gouty last month, note reviewed. Planned image guided radiation therapy (brachytherapy) and androgen deprivation therapy with Lupron. 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   DM - overdue for f/u. Tolerating extended release metformin without diarrhea - states he's taking 3 tablets a day and tolerating this well.  Lab Results  Component Value Date   HGBA1C 8.6 (H) 06/09/2018    Diabetic Foot Exam - Simple   Simple Foot Form Diabetic Foot exam was performed with the following findings:  Yes 11/03/2018 10:47 AM  Visual Inspection See comments:  Yes Sensation Testing Intact to touch and monofilament testing bilaterally:  Yes Pulse Check See comments:  Yes Comments Mildly diminished pulses bilaterally L 5th toe with sore at tip from where he accidentally cut too deeply while cutting his toenails - area dressed with triple abx ointment and covered with bandaid      Relevant past medical, surgical, family and social history reviewed and updated as indicated. Interim medical history since our  last visit reviewed. Allergies and medications reviewed and updated. Outpatient Medications Prior to Visit  Medication Sig Dispense Refill  . albuterol (PROVENTIL HFA;VENTOLIN HFA) 108 (90 Base) MCG/ACT inhaler INHALE 2 PUFFS BY MOUTH EVERY 6 HOURS AS NEEDED FOR WHEEZING OR SHORTNESS OF BREATH 17 g 0  . amiodarone (PACERONE) 200 MG tablet TAKE 1 TABLET(200 MG) BY MOUTH TWICE DAILY 180 tablet 3  . amLODipine (NORVASC) 5 MG tablet TAKE 1 TABLET(5 MG) BY MOUTH DAILY AS NEEDED 90 tablet 3  . diltiazem (CARDIZEM) 30 MG tablet TAKE 1 TABLET BY MOUTH EVERY DAY AS DIRECTED AS NEEDED FOR FAST HEART RATE 90 tablet 1  . ferrous sulfate 325 (65 FE) MG tablet Take 1 tablet (325 mg total) by mouth daily.    . fish oil-omega-3 fatty acids 1000 MG capsule Take 2 g by mouth daily.     . furosemide (LASIX) 20 MG tablet TAKE 1 TABLET BY MOUTH AS NEEDED FOR FLUID 30 tablet 5  . gabapentin (NEURONTIN) 600 MG tablet TAKE 1 TABLET(600 MG) BY MOUTH TWICE DAILY 180 tablet 0  . nitroGLYCERIN (NITROLINGUAL) 0.4 MG/SPRAY spray USE 1 SPRAY AS DIRECTED EVERY 5 MINUTES AS NEEDED 4.9 g 1  . omeprazole (PRILOSEC) 40 MG capsule TAKE 1 CAPSULE BY MOUTH EVERY DAY 90 capsule 2  . tiZANidine (ZANAFLEX) 4 MG tablet Take 1 tablet (4 mg total) by mouth every 8 (eight) hours as needed for muscle spasms. 250 tablet 1  . vitamin B-12 (  CYANOCOBALAMIN) 1000 MCG tablet Take 1 tablet (1,000 mcg total) by mouth daily.    Marland Kitchen warfarin (COUMADIN) 5 MG tablet TAKE AS DIRECTED BY COUMADIN CLINIC 100 tablet 0  . metFORMIN (GLUCOPHAGE XR) 500 MG 24 hr tablet Take 1 tablet (500 mg total) by mouth daily with breakfast. 30 tablet 6  . metFORMIN (GLUCOPHAGE-XR) 500 MG 24 hr tablet TAKE 1 TABLET(500 MG) BY MOUTH DAILY WITH BREAKFAST 90 tablet 0  . metoprolol tartrate (LOPRESSOR) 25 MG tablet Take 1 tablet (25 mg total) by mouth daily. 90 tablet 0  . metoprolol tartrate (LOPRESSOR) 25 MG tablet Take 1 tablet (25 mg total) by mouth as directed. Once daily at  5 pm 90 tablet 3  . amiodarone (PACERONE) 200 MG tablet TAKE 1 TABLET(200 MG) BY MOUTH TWICE DAILY 60 tablet 0  . amoxicillin (AMOXIL) 875 MG tablet Take 1 tablet (875 mg total) by mouth 2 (two) times daily. 20 tablet 0  . diltiazem (CARDIZEM) 30 MG tablet Take 30 mg by mouth daily as needed (TAKE 1 TABLET BY MOUTH EVERY DAY AS DIRECTED AS NEEDED FOR FAST HEART RATE).    Marland Kitchen HYDROcodone-acetaminophen (NORCO) 10-325 MG tablet Take 1 tablet by mouth every 8 (eight) hours as needed for severe pain. 90 tablet 0   No facility-administered medications prior to visit.      Per HPI unless specifically indicated in ROS section below Review of Systems     Objective:    BP 124/68 (BP Location: Left Arm, Patient Position: Sitting, Cuff Size: Normal)   Pulse 80   Temp 97.8 F (36.6 C) (Oral)   Ht 5\' 7"  (1.702 m)   Wt 220 lb (99.8 kg)   SpO2 90%   BMI 34.46 kg/m   Wt Readings from Last 3 Encounters:  11/03/18 220 lb (99.8 kg)  09/28/18 218 lb 7.6 oz (99.1 kg)  06/09/18 221 lb 4 oz (100.4 kg)    Physical Exam  Constitutional: He appears well-developed and well-nourished. No distress.  HENT:  Head: Normocephalic and atraumatic.  Right Ear: External ear normal.  Left Ear: External ear normal.  Nose: Nose normal.  Mouth/Throat: Oropharynx is clear and moist. No oropharyngeal exudate.  Eyes: Pupils are equal, round, and reactive to light. Conjunctivae and EOM are normal. No scleral icterus.  Neck: Normal range of motion. Neck supple.  Cardiovascular: Normal rate, regular rhythm, normal heart sounds and intact distal pulses.  No murmur heard. Pulmonary/Chest: Effort normal and breath sounds normal. No respiratory distress. He has no wheezes. He has no rales.  Musculoskeletal: He exhibits no edema.  Tender to palpation midline cervical spine as well as at anterior and posterior left shoulder FROM bilateral shoulders, pain with raising L shoulder past midline See HPI for foot exam if done    Lymphadenopathy:    He has no cervical adenopathy.  Skin: Skin is warm and dry. No rash noted.  Psychiatric: He has a normal mood and affect.  Nursing note and vitals reviewed.  Results for orders placed or performed in visit on 10/25/18  POCT INR  Result Value Ref Range   INR 2.3 2.0 - 3.0   Lab Results  Component Value Date   CREATININE 1.34 (H) 06/09/2018   BUN 21 06/09/2018   NA 141 06/09/2018   K 4.8 06/09/2018   CL 106 06/09/2018   CO2 29 06/09/2018    L shoulder steroid injection:  IC obtained and in chart.  Landmarks palpated and area cleaned with EtOH wipe.  Ethyl chloride for numbing. Using posterior lateral approach, steroid injection today - 1cc depo medrol (40mg ), 4cc 1% lidocaine using 22g 1.5in needle.  Pt tolerated well.     Assessment & Plan:   Problem List Items Addressed This Visit    Type 2 diabetes, uncontrolled, with neuropathy ([place])    Tolerating metformin XR well - will continue.  Update A1c today.       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       Prostate cancer ([place])    Appreciate uro and rad onc care. Seems he has planned to undergo brachytherapy.        Relevant Medications   methylPREDNISolone acetate (DEPO-MEDROL) injection 40 mg (Completed)   OSA (obstructive sleep apnea)    Adamant about refusing CPAP and further sleep evaluation.      Encounter for chronic pain management - Primary    [place] CSRS reviewed.  Hydrocodone refilled.  Will need UDS next visit.       COPD mixed type ([place])   Relevant Medications   methylPREDNISolone acetate (DEPO-MEDROL) injection 40 mg (Completed)   Chronic pain syndrome (Chronic)    Hydrocodone refilled.       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 Now with acute worsening of left shoulder pain without endorsed injury/trauma. Exam today consistent with shoulder bursitis.  Requests steroid injection. Discussed risks/benefits of procedure, discussed monitoring sugar after steroid injection, discussed risks of tendon rupture, discussed risks of infection and red flags to seek care reviewed. Update if not improving after treatment for further evaluation.       Relevant Medications   methylPREDNISolone acetate (DEPO-MEDROL) injection 40 mg (Completed)   Atrial fibrillation ([facility])    Chronic, on amiodarone and coumadin.          Meds ordered this encounter  Medications  . HYDROcodone-acetaminophen (NORCO) 10-325 MG tablet    Sig: Take 1 tablet by mouth every 8 (eight) hours as needed for severe pain.    Dispense:  90 tablet    Refill:  0  . metFORMIN (GLUCOPHAGE-XR) 500 MG 24 hr tablet    Sig: Take 3 tablets (1,500 mg total) by mouth daily with breakfast.    Dispense:  270 tablet    Refill:  3  . methylPREDNISolone acetate (DEPO-MEDROL) injection 40 mg   Orders Placed This Encounter  Procedures  . Hemoglobin A1c  . Renal function panel    Follow up plan: Return in about 3 months (around 02/03/2019) for follow up visit.  Ria Bush, MD

## 2018-11-03 NOTE — Patient Instructions (Addendum)
Labs today.  Steroid injection into left shoulder.  Continue metformin 3 tablets daily Hydrocodone refilled. Return in 3 months for pain follow up visit.

## 2018-11-03 NOTE — Assessment & Plan Note (Signed)
Adamant about refusing CPAP and further sleep evaluation.

## 2018-11-03 NOTE — Assessment & Plan Note (Signed)
Tolerating metformin XR well - will continue.  Update A1c today.

## 2018-11-03 NOTE — Assessment & Plan Note (Signed)
Hydrocodone refilled.  

## 2018-11-03 NOTE — Assessment & Plan Note (Addendum)
[place] CSRS reviewed.  Hydrocodone refilled.  Will need UDS next visit.

## 2018-11-03 NOTE — Assessment & Plan Note (Signed)
S/p EVAR 2012, then stent this past year.

## 2018-11-03 NOTE — Assessment & Plan Note (Signed)
Appreciate uro and rad onc care. Seems he has planned to undergo brachytherapy.

## 2018-11-14 ENCOUNTER — Other Ambulatory Visit: Payer: Self-pay | Admitting: Family Medicine

## 2018-11-15 ENCOUNTER — Other Ambulatory Visit: Payer: Self-pay | Admitting: Family Medicine

## 2018-11-15 DIAGNOSIS — C61 Malignant neoplasm of prostate: Secondary | ICD-10-CM | POA: Diagnosis not present

## 2018-11-15 DIAGNOSIS — Z5111 Encounter for antineoplastic chemotherapy: Secondary | ICD-10-CM | POA: Diagnosis not present

## 2018-11-16 ENCOUNTER — Other Ambulatory Visit: Payer: Self-pay | Admitting: Family Medicine

## 2018-11-16 NOTE — Telephone Encounter (Signed)
[name] would like a refill of the following medications:    gabapentin (NEURONTIN) 600 MG tablet Jim Bush, MD]   Patient Comment: Hi Dr. Darnell Level., I am requesting to change the prescription for gabapentin. I reviewed previous prescription of this medicine and it read "take 1 tablet by mouth 4 times daily" but was changed at some point to 2 times daily. I need to have it changed to 4 times daily because it does help with the pain I am experiencing in shoulder and lower back. Thank you.   Preferred pharmacy: [id number] - [address], [place]

## 2018-11-17 MED ORDER — GABAPENTIN 600 MG PO TABS: 600.0000 mg | ORAL_TABLET | Freq: Four times a day (QID) | ORAL | 6 refills | Status: DC | PRN

## 2018-11-18 ENCOUNTER — Ambulatory Visit
Admission: RE | Admit: 2018-11-18 | Discharge: 2018-11-18 | Disposition: A | Discharge status: Home / Self Care | Payer: Medicare Other | Source: Ambulatory Visit | Attending: Radiation Oncology | Admitting: Radiation Oncology

## 2018-11-18 DIAGNOSIS — C61 Malignant neoplasm of prostate: Secondary | ICD-10-CM | POA: Insufficient documentation

## 2018-11-18 DIAGNOSIS — Z510 Encounter for antineoplastic radiation therapy: Secondary | ICD-10-CM | POA: Insufficient documentation

## 2018-11-19 DIAGNOSIS — C61 Malignant neoplasm of prostate: Secondary | ICD-10-CM | POA: Diagnosis not present

## 2018-11-19 DIAGNOSIS — Z510 Encounter for antineoplastic radiation therapy: Secondary | ICD-10-CM | POA: Diagnosis not present

## 2018-11-22 ENCOUNTER — Ambulatory Visit (INDEPENDENT_AMBULATORY_CARE_PROVIDER_SITE_OTHER): Payer: Medicare Other

## 2018-11-22 DIAGNOSIS — I4891 Unspecified atrial fibrillation: Secondary | ICD-10-CM

## 2018-11-22 DIAGNOSIS — Z5181 Encounter for therapeutic drug level monitoring: Secondary | ICD-10-CM

## 2018-11-22 LAB — POCT INR: INR: 1.3 — AB (ref 2.0–3.0)

## 2018-11-22 NOTE — Patient Instructions (Signed)
Please take 2 tablets today, 1 tablet tomorrow, then resume taking 1/2 tablet every day except 1 tablet on Mondays, Wednesdays and Fridays.  Recheck INR in 2 weeks.

## 2018-11-26 ENCOUNTER — Other Ambulatory Visit: Payer: Self-pay | Admitting: *Deleted

## 2018-11-26 DIAGNOSIS — C61 Malignant neoplasm of prostate: Secondary | ICD-10-CM

## 2018-11-29 ENCOUNTER — Other Ambulatory Visit: Payer: Self-pay | Admitting: Family Medicine

## 2018-11-29 ENCOUNTER — Ambulatory Visit
Admission: RE | Admit: 2018-11-29 | Discharge: 2018-11-29 | Disposition: A | Discharge status: Home / Self Care | Payer: Medicare Other | Source: Ambulatory Visit | Attending: Radiation Oncology | Admitting: Radiation Oncology

## 2018-11-29 DIAGNOSIS — G894 Chronic pain syndrome: Secondary | ICD-10-CM

## 2018-11-29 NOTE — Telephone Encounter (Signed)
Name of Medication: Hydrocodone-APAP Name of Pharmacy: [facility] Last Fill or Written Date and Quantity: 11/03/18, #90/0 Last Office Visit and Type: 11/03/18, chronic pain f/u Next Office Visit and Type: none Last Controlled Substance Agreement Date: 07/06/17 Last UDS: 07/06/17

## 2018-11-30 MED ORDER — HYDROCODONE-ACETAMINOPHEN 10-325 MG PO TABS: 1.0000 | ORAL_TABLET | Freq: Three times a day (TID) | ORAL | 0 refills | Status: DC | PRN

## 2018-11-30 NOTE — Telephone Encounter (Signed)
Eprescribed.

## 2018-12-02 ENCOUNTER — Ambulatory Visit
Admission: RE | Admit: 2018-12-02 | Discharge: 2018-12-02 | Disposition: A | Discharge status: Home / Self Care | Payer: Medicare Other | Source: Ambulatory Visit | Attending: Radiation Oncology | Admitting: Radiation Oncology

## 2018-12-02 DIAGNOSIS — C61 Malignant neoplasm of prostate: Secondary | ICD-10-CM | POA: Diagnosis not present

## 2018-12-02 DIAGNOSIS — Z510 Encounter for antineoplastic radiation therapy: Secondary | ICD-10-CM | POA: Diagnosis not present

## 2018-12-03 ENCOUNTER — Other Ambulatory Visit: Payer: Self-pay | Admitting: Cardiovascular Disease

## 2018-12-03 ENCOUNTER — Ambulatory Visit
Admission: RE | Admit: 2018-12-03 | Discharge: 2018-12-03 | Disposition: A | Discharge status: Home / Self Care | Payer: Medicare Other | Source: Ambulatory Visit | Attending: Radiation Oncology | Admitting: Radiation Oncology

## 2018-12-03 DIAGNOSIS — C61 Malignant neoplasm of prostate: Secondary | ICD-10-CM | POA: Diagnosis not present

## 2018-12-03 DIAGNOSIS — Z510 Encounter for antineoplastic radiation therapy: Secondary | ICD-10-CM | POA: Diagnosis not present

## 2018-12-05 ENCOUNTER — Other Ambulatory Visit: Payer: Self-pay | Admitting: Cardiovascular Disease

## 2018-12-06 ENCOUNTER — Ambulatory Visit (INDEPENDENT_AMBULATORY_CARE_PROVIDER_SITE_OTHER): Payer: Medicare Other

## 2018-12-06 ENCOUNTER — Ambulatory Visit
Admission: RE | Admit: 2018-12-06 | Discharge: 2018-12-06 | Disposition: A | Discharge status: Home / Self Care | Payer: Medicare Other | Source: Ambulatory Visit | Attending: Radiation Oncology | Admitting: Radiation Oncology

## 2018-12-06 DIAGNOSIS — I4891 Unspecified atrial fibrillation: Secondary | ICD-10-CM | POA: Diagnosis not present

## 2018-12-06 DIAGNOSIS — Z5181 Encounter for therapeutic drug level monitoring: Secondary | ICD-10-CM | POA: Diagnosis not present

## 2018-12-06 DIAGNOSIS — C61 Malignant neoplasm of prostate: Secondary | ICD-10-CM | POA: Diagnosis not present

## 2018-12-06 DIAGNOSIS — Z510 Encounter for antineoplastic radiation therapy: Secondary | ICD-10-CM | POA: Diagnosis not present

## 2018-12-06 LAB — POCT INR: INR: 1.8 — AB (ref 2.0–3.0)

## 2018-12-06 NOTE — Telephone Encounter (Signed)
Refill Request.  

## 2018-12-06 NOTE — Patient Instructions (Signed)
Please take 2 tablets today, then START NEW DOSAGE of 1 tablet every day except 1/2 tablet on Mondays, Wednesdays and Fridays.  Recheck INR in 2 weeks.

## 2018-12-07 ENCOUNTER — Other Ambulatory Visit: Payer: Self-pay | Admitting: *Deleted

## 2018-12-07 ENCOUNTER — Ambulatory Visit
Admission: RE | Admit: 2018-12-07 | Discharge: 2018-12-07 | Disposition: A | Discharge status: Home / Self Care | Payer: Medicare Other | Source: Ambulatory Visit | Attending: Radiation Oncology | Admitting: Radiation Oncology

## 2018-12-07 DIAGNOSIS — C61 Malignant neoplasm of prostate: Secondary | ICD-10-CM | POA: Diagnosis not present

## 2018-12-07 DIAGNOSIS — Z510 Encounter for antineoplastic radiation therapy: Secondary | ICD-10-CM | POA: Diagnosis not present

## 2018-12-07 MED ORDER — TAMSULOSIN HCL 0.4 MG PO CAPS: 0.4000 mg | ORAL_CAPSULE | Freq: Every day | ORAL | 6 refills | Status: DC

## 2018-12-09 ENCOUNTER — Ambulatory Visit
Admission: RE | Admit: 2018-12-09 | Discharge: 2018-12-09 | Disposition: A | Discharge status: Home / Self Care | Payer: Medicare Other | Source: Ambulatory Visit | Attending: Radiation Oncology | Admitting: Radiation Oncology

## 2018-12-09 DIAGNOSIS — C61 Malignant neoplasm of prostate: Secondary | ICD-10-CM | POA: Insufficient documentation

## 2018-12-09 DIAGNOSIS — Z510 Encounter for antineoplastic radiation therapy: Secondary | ICD-10-CM | POA: Diagnosis not present

## 2018-12-10 ENCOUNTER — Ambulatory Visit
Admission: RE | Admit: 2018-12-10 | Discharge: 2018-12-10 | Disposition: A | Discharge status: Home / Self Care | Payer: Medicare Other | Source: Ambulatory Visit | Attending: Radiation Oncology | Admitting: Radiation Oncology

## 2018-12-10 DIAGNOSIS — Z510 Encounter for antineoplastic radiation therapy: Secondary | ICD-10-CM | POA: Diagnosis not present

## 2018-12-10 DIAGNOSIS — C61 Malignant neoplasm of prostate: Secondary | ICD-10-CM | POA: Diagnosis not present

## 2018-12-13 ENCOUNTER — Ambulatory Visit
Admission: RE | Admit: 2018-12-13 | Discharge: 2018-12-13 | Disposition: A | Discharge status: Home / Self Care | Payer: Medicare Other | Source: Ambulatory Visit | Attending: Radiation Oncology | Admitting: Radiation Oncology

## 2018-12-13 ENCOUNTER — Ambulatory Visit (INDEPENDENT_AMBULATORY_CARE_PROVIDER_SITE_OTHER): Payer: Medicare Other | Admitting: Physician Assistant

## 2018-12-13 ENCOUNTER — Telehealth: Payer: Self-pay | Admitting: Cardiovascular Disease

## 2018-12-13 ENCOUNTER — Ambulatory Visit (INDEPENDENT_AMBULATORY_CARE_PROVIDER_SITE_OTHER): Payer: Medicare Other

## 2018-12-13 ENCOUNTER — Encounter: Payer: Self-pay | Admitting: Physician Assistant

## 2018-12-13 VITALS — BP 96/60 | HR 82 | Ht 72.0 in | Wt 223.5 lb

## 2018-12-13 DIAGNOSIS — I495 Sick sinus syndrome: Secondary | ICD-10-CM | POA: Diagnosis not present

## 2018-12-13 DIAGNOSIS — I4819 Other persistent atrial fibrillation: Secondary | ICD-10-CM

## 2018-12-13 DIAGNOSIS — Z9114 Patient's other noncompliance with medication regimen: Secondary | ICD-10-CM

## 2018-12-13 DIAGNOSIS — I4891 Unspecified atrial fibrillation: Secondary | ICD-10-CM

## 2018-12-13 DIAGNOSIS — R42 Dizziness and giddiness: Secondary | ICD-10-CM | POA: Diagnosis not present

## 2018-12-13 DIAGNOSIS — I959 Hypotension, unspecified: Secondary | ICD-10-CM | POA: Diagnosis not present

## 2018-12-13 DIAGNOSIS — Z79899 Other long term (current) drug therapy: Secondary | ICD-10-CM | POA: Diagnosis not present

## 2018-12-13 DIAGNOSIS — C61 Malignant neoplasm of prostate: Secondary | ICD-10-CM | POA: Diagnosis not present

## 2018-12-13 DIAGNOSIS — Z5181 Encounter for therapeutic drug level monitoring: Secondary | ICD-10-CM | POA: Diagnosis not present

## 2018-12-13 DIAGNOSIS — I4811 Longstanding persistent atrial fibrillation: Secondary | ICD-10-CM

## 2018-12-13 DIAGNOSIS — I714 Abdominal aortic aneurysm, without rupture, unspecified: Secondary | ICD-10-CM

## 2018-12-13 DIAGNOSIS — R002 Palpitations: Secondary | ICD-10-CM

## 2018-12-13 DIAGNOSIS — Z510 Encounter for antineoplastic radiation therapy: Secondary | ICD-10-CM | POA: Diagnosis not present

## 2018-12-13 LAB — POCT INR: INR: 2.8 (ref 2.0–3.0)

## 2018-12-13 NOTE — Telephone Encounter (Signed)
See complaint, pt in lobby. Pt added to schedule for Christell Faith, PA next available.

## 2018-12-13 NOTE — Progress Notes (Signed)
Cardiology Office Note Date:  12/13/2018  Patient ID:  [name], [name] 28-Jul-1946, MRN [id number] PCP:  Ria Bush, MD  Cardiologist:  Dr. Rockey Situ, MD    Chief Complaint: Work in for dizziness, low blood pressure, tachycardia  History of Present Illness: [name] is a 73 y.o. male with history of persistent A. fib on Coumadin, tachybradycardia syndrome with refusal of pacemaker, diastolic dysfunction, AAA status post endovascular repair at [facility] in 2017, medication nonadherence, prostate cancer with radiation therapy, COPD with ongoing tobacco abuse, poorly controlled diabetes, hyperlipidemia, hypertension, obesity, and obstructive sleep apnea noncompliant with CPAP who presents as a work in for evaluation of dizziness, low blood pressure, and tachycardia.  Patient most recently underwent echocardiogram in 12/2010 that showed an EF of 55 to 65%, moderate LVH, unable to exclude regional wall motion abnormality, grade 1 diastolic dysfunction, mildly dilated left atrium.  Prior stress test in 12/2010 with report interpretation unavailable for review.  Patient has previously been evaluated by EP for chronotropic incompetence/tachybradycardia syndrome with the patient refusing pacemaker implantation.  Patient has a history of taking medications at his own direction and does not follow the directions on his prescriptions.  He was last seen in the office in 02/2018 for follow-up and was noted to be taking extra doses of amiodarone.  He reported issues with sleeping and heart rate in the 80s to 100s beats per minute in which he was symptomatic with palpitations.  He continued to have issues with labile hypertension.  He was restarted on metoprolol (which was previously held secondary to bradycardia) to be taken at 5 PM on a nightly basis.  It was recommended that he take no more than 2 amiodarone daily.  The patient presented to our lobby this morning noting since he started radiation therapy for his  prostate cancer he has felt weak, dizzy, and his blood pressure has been low with associated higher than normal heart rates.  He reports he has been taking at least 4 tabs of amiodarone on a daily basis and only takes his metoprolol or diltiazem when his heart rate gets 115 bpm or higher.  He reports he only takes his Norvasc when his blood pressure gets to 242 mmHg systolic.  He notes significant nocturia for which he has been started on Flomax.  He continues to be noncompliant with his CPAP.  He is concerned he may have had a mini stroke over the weekend in the setting of his dizziness.  He reports he has been unable to get his heart rate less than 115 bpm.  When asked how his heart rate was this morning prior to coming to the office he states it was 87 bpm.  In the office today, his heart rate is 82 bpm.  He remains in A. fib.  Most recent INR from today is therapeutic at 2.8.  Prior INRs leading up to today were subtherapeutic in the setting of planned seed implantation for his prostate cancer.   Past Medical History:  Diagnosis Date  . AAA (abdominal aortic aneurysm) ([place]) 2013   s/p stent graft repair now with supra/pararenal aneurysm 3.5cm, referred to Dr. Sammuel Hines at [facility] for endovascular repair (12/2013)  . Abnormal drug screen 06/2015   see problem list  . Cervical neck pain with evidence of disc disease 07/2011   MRI - disk bulging and foraminal stenosis, advanced at C4/5, 5/6; rec pain management for ESI by Dr. Mack Guise   . CHF (congestive heart failure) ([place])   .  COPD (chronic obstructive pulmonary disease) (HCC)    mod-severe COPD/emphysema.  PFTs 12/2010.  He still smokes 1 ppd.  . Depression   . ED (erectile dysfunction) 02/2012   penile injections - failed viagra, poor arterial flow ([place])  . Fatty liver   . GERD (gastroesophageal reflux disease)   . Hyperlipidemia    myalgias with simvastatin and atorvastatin  . Leg cramps    idiopathic severe  . Muscle spasm    chronic  .  Neuralgia    pain in hands. L>R from accident  . Obesity   . Olecranon bursitis of left elbow 09/01/2014   S/p aspiration x3 in our office and x2 by ortho [facility])   . OSA (obstructive sleep apnea) 03/2012   AHI 18.6, desat to 74%, severe snoring, consider ENT eval  . Osteoarthritis   . Paroxysmal atrial fibrillation (HCC)    on coumadin only.  . Peripheral autonomic neuropathy due to diabetes mellitus ([place])   . Prostate cancer ([place]) 12/18/2017   Gleason 4+3 - 7 in 1/12 cores 08/2018 given comorbidities rec radiation therapy by Dr Donella Stade in [place])  . Right shoulder injury 05/2012   after fall out of chair, s/p injection, rec conservative management with PT Noemi Chapel)  . Smoker    1ppd  . T2DM (type 2 diabetes mellitus) ([place])     Past Surgical History:  Procedure Laterality Date  . CHOLECYSTECTOMY  2001  . CTA abd  09/2011   6.1cm AAA, bilateral ing hernias, R with bladder wall, promient prostate calcifications  . ENDOVASCULAR STENT INSERTION  11/11/2011   Procedure: ENDOVASCULAR STENT GRAFT INSERTION;  Surgeon: Angelia Mould, MD;  Location: [place];  Service: Vascular;  Laterality: N/A;  aorta bi iliac  . KNEE SURGERY     L side cartilage taken out  . PFTs  12/2010   mod-severe obstruction, ?bronchodilator response  . TONSILLECTOMY      Current Meds  Medication Sig  . amiodarone (PACERONE) 200 MG tablet TAKE 1 TABLET(200 MG) BY MOUTH TWICE DAILY  . amLODipine (NORVASC) 5 MG tablet TAKE 1 TABLET(5 MG) BY MOUTH DAILY AS NEEDED  . diltiazem (CARDIZEM) 30 MG tablet TAKE 1 TABLET BY MOUTH EVERY DAY AS DIRECTED AS NEEDED FOR FAST HEART RATE  . ferrous sulfate 325 (65 FE) MG tablet Take 1 tablet (325 mg total) by mouth daily.  . fish oil-omega-3 fatty acids 1000 MG capsule Take 2 g by mouth daily.   . furosemide (LASIX) 20 MG tablet TAKE 1 TABLET BY MOUTH AS NEEDED FOR FLUID  . gabapentin (NEURONTIN) 600 MG tablet Take 1 tablet (600 mg total) by mouth 4 (four) times  daily as needed.  Marland Kitchen HYDROcodone-acetaminophen (NORCO) 10-325 MG tablet Take 1 tablet by mouth every 8 (eight) hours as needed for severe pain.  . metFORMIN (GLUCOPHAGE-XR) 500 MG 24 hr tablet Take 3 tablets (1,500 mg total) by mouth daily with breakfast.  . nitroGLYCERIN (NITROLINGUAL) 0.4 MG/SPRAY spray USE 1 SPRAY AS DIRECTED EVERY 5 MINUTES AS NEEDED  . omeprazole (PRILOSEC) 40 MG capsule TAKE 1 CAPSULE BY MOUTH EVERY DAY  . PROAIR HFA 108 (90 Base) MCG/ACT inhaler INHALE 2 PUFFS BY MOUTH EVERY 6 HOURS AS NEEDED FOR WHEEZING OR SHORTNESS OF BREATH  . tamsulosin (FLOMAX) 0.4 MG CAPS capsule Take 1 capsule (0.4 mg total) by mouth daily after supper.  Marland Kitchen tiZANidine (ZANAFLEX) 4 MG tablet Take 1 tablet (4 mg total) by mouth every 8 (eight) hours as needed for muscle  spasms.  . vitamin B-12 (CYANOCOBALAMIN) 1000 MCG tablet Take 1 tablet (1,000 mcg total) by mouth daily.  Marland Kitchen warfarin (COUMADIN) 5 MG tablet TAKE AS DIRECTED BY COUMADIN CLINIC    Allergies:   Oxycodone hcl; Metformin and related; Varenicline tartrate; Varenicline tartrate; Wellbutrin [bupropion hcl]; and Zocor [simvastatin]   Social History:  The patient  reports that he has been smoking cigarettes. He has a 52.00 pack-year smoking history. He has never used smokeless tobacco. He reports current alcohol use of about 7.0 standard drinks of alcohol per week. He reports that he does not use drugs.   Family History:  The patient's family history includes Arthritis in his mother; Coronary artery disease in his father; Heart disease in his father; Leukemia in his father; Melanoma in his sister.  ROS:   Review of Systems  Constitutional: Positive for malaise/fatigue. Negative for chills, diaphoresis, fever and weight loss.  HENT: Negative for congestion.   Eyes: Negative for discharge and redness.  Respiratory: Negative for cough, hemoptysis, sputum production, shortness of breath and wheezing.   Cardiovascular: Negative for chest pain,  palpitations, orthopnea, claudication, leg swelling and PND.  Gastrointestinal: Negative for abdominal pain, blood in stool, heartburn, melena, nausea and vomiting.  Genitourinary: Negative for hematuria.  Musculoskeletal: Negative for falls and myalgias.  Skin: Negative for rash.  Neurological: Positive for dizziness and weakness. Negative for tingling, tremors, sensory change, speech change, focal weakness and loss of consciousness.  Endo/Heme/Allergies: Does not bruise/bleed easily.  Psychiatric/Behavioral: Negative for substance abuse. The patient is not nervous/anxious.      PHYSICAL EXAM:  VS:  BP 96/60 (BP Location: Right Arm, Patient Position: Sitting, Cuff Size: Normal)   Pulse 82   Ht 6' (1.829 m)   Wt 223 lb 8 oz (101.4 kg)   BMI 30.31 kg/m  BMI: Body mass index is 30.31 kg/m.  Physical Exam  Constitutional: He is oriented to person, place, and time. He appears well-developed and well-nourished.  HENT:  Head: Normocephalic and atraumatic.  Eyes: Right eye exhibits no discharge. Left eye exhibits no discharge.  Neck: Normal range of motion. No JVD present.  Cardiovascular: Normal rate, S1 normal, S2 normal and normal heart sounds. An irregularly irregular rhythm present. Exam reveals no distant heart sounds, no friction rub, no midsystolic click and no opening snap.  No murmur heard. Pulses:      Posterior tibial pulses are 2+ on the right side and 2+ on the left side.  Pulmonary/Chest: Effort normal and breath sounds normal. No respiratory distress. He has no decreased breath sounds. He has no wheezes. He has no rales. He exhibits no tenderness.  Abdominal: Soft. He exhibits no distension. There is no abdominal tenderness.  Musculoskeletal:        General: No edema.     Comments: 5 out of 5 strength throughout  Neurological: He is alert and oriented to person, place, and time.  Skin: Skin is warm and dry. No cyanosis. Nails show no clubbing.  Psychiatric: He has a  normal mood and affect. His speech is normal and behavior is normal. Judgment and thought content normal.     EKG:  Was ordered and interpreted by me today. Shows A. fib, 82 bpm, left axis deviation, unable to exclude prior inferior infarct, nonspecific ST-T changes  Recent Labs: 12/14/2017: TSH 1.93 06/09/2018: ALT 10; Hemoglobin 15.4; Platelets 268 11/03/2018: BUN 15; Creatinine, Ser 1.09; Potassium 4.2; Sodium 139  12/14/2017: Cholesterol 136; HDL 31.30; LDL Cholesterol 79; Total CHOL/HDL Ratio  4; Triglycerides 128.0; VLDL 25.6   CrCl cannot be calculated (Patient's most recent lab result is older than the maximum 21 days allowed.).   Wt Readings from Last 3 Encounters:  12/13/18 223 lb 8 oz (101.4 kg)  11/03/18 220 lb (99.8 kg)  09/28/18 218 lb 7.6 oz (99.1 kg)     Other studies reviewed: Additional studies/records reviewed today include: summarized above  ASSESSMENT AND PLAN:  1. Dizziness/hypotension/tachypalpitations: Currently asymptomatic.  Patient reports significant dizziness with associated weakness, hypotension, and tachypalpitations over the weekend with symptoms lasting for 4 to 6 hours.  Symptoms improved after patient took a nap.  He is concerned he may have had a "mini stroke."  I have advised the patient that we cannot definitively evaluate him in the office setting and recommend transferring him to the ED for further evaluation, imaging, and lab work.  The patient refuses to go to the ED today.  He is aware of the risks of going [place].  In this setting, we will obtain an echocardiogram, CMP, CBC, and TSH.  He has been asked to increase his water intake for possible hypovolemic hypotension/dehydration.  I suspect a large portion of the patient's symptoms are in the setting of him taking liberties with his medications and not following provider recommended instructions.    2. Persistent A. fib: Patient remains in A. fib today with well-controlled ventricular  rates.  He is asymptomatic.  I have recommended that the patient take medications as directed and would also consider decreasing his amiodarone to 200 mg once daily though will defer this to his primary cardiologist.  I have advised the patient to take metoprolol as scheduled at 5 PM as long as his systolic blood pressure is greater than 110 mmHg.  He remains on Coumadin with therapeutic INR at this time.  A. fib certainly may be exacerbated by his underlying prostate cancer with ongoing radiation therapy.  Should he remain in A. fib in 3 weeks time we could plan to cardiovert him as he would have adequately been anticoagulated.  Should he develop persistent tachypalpitations we could perform a TEE/DCCV though would ideally like to avoid this.  We will obtain a CBC, CMP, and TSH given his amiodarone usage.  Recommend he follow-up with pulmonology for PFTs as well as yearly eye exams given amiodarone usage.  3. Tachybradycardia syndrome: Overall this is a difficult situation given his refusal for pacemaker.  In this setting, it will be very difficult to adequately maintain good rate control.  He continues to refuse pacemaker.  4. Diastolic dysfunction: He appears well compensated and euvolemic on exam.  He continues to take Lasix as needed.  Check echocardiogram as above.  5. Medication noncompliance: Likely playing a significant role in his above presentation.  I have recommended that he follow instructions on medications as directed.  6. AAA: Status post endovascular repair.  Followed by [facility]  7. COPD with ongoing tobacco abuse: No acute exacerbation.  Complete cessation is advised.  Disposition: F/u with Dr. Rockey Situ or an APP in 3 months.  Current medicines are reviewed at length with the patient today.  The patient did not have any concerns regarding medicines.  Signed, Christell Faith, PA-C 12/13/2018 9:59 AM     [place] [address] [place] [address] [place], [place] [address] [phone]

## 2018-12-13 NOTE — Telephone Encounter (Signed)
STAT if patient feels like he/she is going to faint   1) Are you dizzy now?  No   2) Do you feel faint or have you passed out? Not currently but during the episode   3) Do you have any other symptoms? Weakness felt like a TIA   4) Have you checked your HR and BP (record if available)? HR 712'X and Systolic BP 27'H    Patient concerned he had TIA this weekend .  BP dropped when HR went up to 130's.  Patient had to lay down as he was weak and dizzy.  Wants to be seen today

## 2018-12-13 NOTE — Patient Instructions (Signed)
Please continue dosage of 1 tablet every day except 1/2 tablet on Mondays, Wednesdays and Fridays.  Recheck INR in 3 weeks.

## 2018-12-13 NOTE — Patient Instructions (Signed)
Medication Instructions:  Your physician recommends that you continue on your current medications as directed. Please refer to the Current Medication list given to you today.  If you need a refill on your cardiac medications before your next appointment, please call your pharmacy.   Lab work: Your physician recommends that you return for lab work today (CBC, CMET, TSH) If you have labs (blood work) drawn today and your tests are completely normal, you will receive your results only by: Marland Kitchen MyChart Message (if you have MyChart) OR . A paper copy in the mail If you have any lab test that is abnormal or we need to change your treatment, we will call you to review the results.  Testing/Procedures: 1- Echo Your physician has requested that you have an echocardiogram. Echocardiography is a painless test that uses sound waves to create images of your heart. It provides your doctor with information about the size and shape of your heart and how well your heart's chambers and valves are working. This procedure takes approximately one hour. There are no restrictions for this procedure.    Follow-Up: At [facility], you and your health needs are our priority.  As part of our continuing mission to provide you with exceptional heart care, we have created designated Provider Care Teams.  These Care Teams include your primary Cardiologist (physician) and Advanced Practice Providers (APPs -  Physician Assistants and Nurse Practitioners) who all work together to provide you with the care you need, when you need it. You will need a follow up appointment in 3 months.  Please call our office 2 months in advance to schedule this appointment.  You may see Dr. Rockey Situ or one of the following Advanced Practice Providers on your designated Care Team:   Murray Hodgkins, NP Christell Faith, PA-C . Marrianne Mood, PA-C  Any Other Special Instructions Will Be Listed Below (If Applicable). 1- Please ensure adequate  hydration by drinking 8-10 (8 oz) water per day.

## 2018-12-14 ENCOUNTER — Ambulatory Visit
Admission: RE | Admit: 2018-12-14 | Discharge: 2018-12-14 | Disposition: A | Discharge status: Home / Self Care | Payer: Medicare Other | Source: Ambulatory Visit | Attending: Radiation Oncology | Admitting: Radiation Oncology

## 2018-12-14 DIAGNOSIS — Z510 Encounter for antineoplastic radiation therapy: Secondary | ICD-10-CM | POA: Diagnosis not present

## 2018-12-14 DIAGNOSIS — C61 Malignant neoplasm of prostate: Secondary | ICD-10-CM | POA: Diagnosis not present

## 2018-12-15 ENCOUNTER — Ambulatory Visit (INDEPENDENT_AMBULATORY_CARE_PROVIDER_SITE_OTHER): Payer: Medicare Other | Admitting: Family Medicine

## 2018-12-15 ENCOUNTER — Ambulatory Visit
Admission: RE | Admit: 2018-12-15 | Discharge: 2018-12-15 | Disposition: A | Discharge status: Home / Self Care | Payer: Medicare Other | Source: Ambulatory Visit | Attending: Radiation Oncology | Admitting: Radiation Oncology

## 2018-12-15 ENCOUNTER — Encounter: Payer: Self-pay | Admitting: Family Medicine

## 2018-12-15 VITALS — BP 114/64 | HR 50 | Temp 97.9°F | Ht 67.0 in | Wt 223.2 lb

## 2018-12-15 DIAGNOSIS — M79601 Pain in right arm: Secondary | ICD-10-CM

## 2018-12-15 DIAGNOSIS — Z510 Encounter for antineoplastic radiation therapy: Secondary | ICD-10-CM | POA: Diagnosis not present

## 2018-12-15 DIAGNOSIS — Y92009 Unspecified place in unspecified non-institutional (private) residence as the place of occurrence of the external cause: Secondary | ICD-10-CM

## 2018-12-15 DIAGNOSIS — M79602 Pain in left arm: Secondary | ICD-10-CM

## 2018-12-15 DIAGNOSIS — M542 Cervicalgia: Secondary | ICD-10-CM | POA: Diagnosis not present

## 2018-12-15 DIAGNOSIS — C61 Malignant neoplasm of prostate: Secondary | ICD-10-CM | POA: Diagnosis not present

## 2018-12-15 DIAGNOSIS — W19XXXA Unspecified fall, initial encounter: Secondary | ICD-10-CM

## 2018-12-15 NOTE — Assessment & Plan Note (Signed)
Appreciate uro and onc care. Currently undergoing radiation therapy.

## 2018-12-15 NOTE — Patient Instructions (Addendum)
I agree I don't think we need xrays today.  Continue gabapentin, hydrocodone, zanaflex, and salon pas.  Use heating pad (not directly on skin) three times a day for the next 3 days.  Let us know when you need refill of pain medicine.

## 2018-12-15 NOTE — Assessment & Plan Note (Signed)
Mechanical fall at home slipped on icy ramp. Reassuring exam today not consistent with arm fracture or significant neck/head trauma. home care reviewed - current pain regimen (hydrocodone, gabapentin, zanaflex, salon pas) and heating pad use. Coumadin use increases bleed risk - extensively reviewed red flags to seek further care - if persistent headache develops, low threshold to obtain head imaging. Pt agrees with plan.

## 2018-12-15 NOTE — Progress Notes (Signed)
BP 114/64 (BP Location: Left Arm, Patient Position: Sitting, Cuff Size: Normal)   Pulse (!) 50   Temp 97.9 F (36.6 C) (Oral)   Ht 5\' 7"  (1.702 m)   Wt 223 lb 4 oz (101.3 kg)   SpO2 96%   BMI 34.97 kg/m    CC: fall  Subjective:    Patient ID: [name], male    DOB: 06-27-1946, 73 y.o.   MRN: [id number]  HPI: [name] is a 73 y.o. male presenting on 12/15/2018 for Fall (Pt fell at home about 7:30 this morning. Slipped on ice on ramp while trying to exit home. Hit posterior head. Has some soreness in posterior head, upper back, posterior upper right arm and posterior bilateral forearms. States he "doubled up" on pain meds. )   DOI: today 12/15/2018  Walking down frozen wooden handicap ramp and slipped. Landed on bilateral forearms and elbows then hit posterior neck and head. No LOC. Was able to get on his knees and get back up. Forearms remain sore. No bruising noted. No residual headache or worsening neck pain noted.   Current pain regimen is hydrocodone 10/325mg  1 tab TID PRN #90/month.  Took double gabapentin and hydrocodone this morning. Also using salon pas.   Seen at [facility] this morning for prostate cancer treatment (daily for 40-60 sessions).      Relevant past medical, surgical, family and social history reviewed and updated as indicated. Interim medical history since our last visit reviewed. Allergies and medications reviewed and updated. Outpatient Medications Prior to Visit  Medication Sig Dispense Refill  . amiodarone (PACERONE) 200 MG tablet TAKE 1 TABLET(200 MG) BY MOUTH TWICE DAILY 180 tablet 3  . amLODipine (NORVASC) 5 MG tablet TAKE 1 TABLET(5 MG) BY MOUTH DAILY AS NEEDED 90 tablet 3  . diltiazem (CARDIZEM) 30 MG tablet TAKE 1 TABLET BY MOUTH EVERY DAY AS DIRECTED AS NEEDED FOR FAST HEART RATE 30 tablet 0  . ferrous sulfate 325 (65 FE) MG tablet Take 1 tablet (325 mg total) by mouth daily.    . fish oil-omega-3 fatty acids 1000 MG capsule Take 2 g by mouth  daily.     . furosemide (LASIX) 20 MG tablet TAKE 1 TABLET BY MOUTH AS NEEDED FOR FLUID 90 tablet 0  . gabapentin (NEURONTIN) 600 MG tablet Take 1 tablet (600 mg total) by mouth 4 (four) times daily as needed. 120 tablet 6  . HYDROcodone-acetaminophen (NORCO) 10-325 MG tablet Take 1 tablet by mouth every 8 (eight) hours as needed for severe pain. 90 tablet 0  . metFORMIN (GLUCOPHAGE-XR) 500 MG 24 hr tablet Take 3 tablets (1,500 mg total) by mouth daily with breakfast. 270 tablet 3  . nitroGLYCERIN (NITROLINGUAL) 0.4 MG/SPRAY spray USE 1 SPRAY AS DIRECTED EVERY 5 MINUTES AS NEEDED 4.9 g 1  . omeprazole (PRILOSEC) 40 MG capsule TAKE 1 CAPSULE BY MOUTH EVERY DAY 90 capsule 2  . PROAIR HFA 108 (90 Base) MCG/ACT inhaler INHALE 2 PUFFS BY MOUTH EVERY 6 HOURS AS NEEDED FOR WHEEZING OR SHORTNESS OF BREATH 17 g 0  . tamsulosin (FLOMAX) 0.4 MG CAPS capsule Take 1 capsule (0.4 mg total) by mouth daily after supper. 30 capsule 6  . tiZANidine (ZANAFLEX) 4 MG tablet Take 1 tablet (4 mg total) by mouth every 8 (eight) hours as needed for muscle spasms. 250 tablet 1  . vitamin B-12 (CYANOCOBALAMIN) 1000 MCG tablet Take 1 tablet (1,000 mcg total) by mouth daily.    Marland Kitchen warfarin (  COUMADIN) 5 MG tablet TAKE AS DIRECTED BY COUMADIN CLINIC 100 tablet 0  . metoprolol tartrate (LOPRESSOR) 25 MG tablet Take 1 tablet (25 mg total) by mouth as directed. Once daily at 5 pm 90 tablet 3   No facility-administered medications prior to visit.      Per HPI unless specifically indicated in ROS section below Review of Systems Objective:    BP 114/64 (BP Location: Left Arm, Patient Position: Sitting, Cuff Size: Normal)   Pulse (!) 50   Temp 97.9 F (36.6 C) (Oral)   Ht 5\' 7"  (1.702 m)   Wt 223 lb 4 oz (101.3 kg)   SpO2 96%   BMI 34.97 kg/m   Wt Readings from Last 3 Encounters:  12/15/18 223 lb 4 oz (101.3 kg)  12/13/18 223 lb 8 oz (101.4 kg)  11/03/18 220 lb (99.8 kg)    Physical Exam Vitals signs and nursing note  reviewed.  Constitutional:      General: He is not in acute distress.    Appearance: Normal appearance. He is normal weight.  HENT:     Head: Normocephalic and atraumatic.     Comments: Tender to palpation L occipital skull without abrasion, hematoma, step off Musculoskeletal: Normal range of motion.     Comments: FROM at neck without significant midline pain of cervical or upper thoracic spine. Sore paracervical mm bilaterally.  FROM bilateral elbows in flexion/extension, no pain with palpation of olecranon bursa or epicondyles. Tender to palpation lateral R forearm without obvious bruising or swelling.   Skin:    General: Skin is warm and dry.     Findings: No bruising or erythema.  Neurological:     General: No focal deficit present.     Mental Status: He is alert.  Psychiatric:        Mood and Affect: Mood normal.       Results for orders placed or performed in visit on 12/13/18  POCT INR  Result Value Ref Range   INR 2.8 2.0 - 3.0   Assessment & Plan:   Problem List Items Addressed This Visit    Prostate cancer ([place])    Appreciate uro and onc care. Currently undergoing radiation therapy.       Fall at home, initial encounter - Primary    Mechanical fall at home slipped on icy ramp. Reassuring exam today not consistent with arm fracture or significant neck/head trauma. home care reviewed - current pain regimen (hydrocodone, gabapentin, zanaflex, salon pas) and heating pad use. Coumadin use increases bleed risk - extensively reviewed red flags to seek further care - if persistent headache develops, low threshold to obtain head imaging. Pt agrees with plan.        Other Visit Diagnoses    Neck pain       Bilateral arm pain           No orders of the defined types were placed in this encounter.  No orders of the defined types were placed in this encounter.  Patient Instructions  I agree I don't think we need xrays today.  Continue gabapentin, hydrocodone, zanaflex,  and salon pas.  Use heating pad (not directly on skin) three times a day for the next 3 days.  Let us know when you need refill of pain medicine.    Follow up plan: Return if symptoms worsen or fail to improve.  Ria Bush, MD

## 2018-12-16 ENCOUNTER — Ambulatory Visit
Admission: RE | Admit: 2018-12-16 | Discharge: 2018-12-16 | Disposition: A | Discharge status: Home / Self Care | Payer: Medicare Other | Source: Ambulatory Visit | Attending: Radiation Oncology | Admitting: Radiation Oncology

## 2018-12-16 ENCOUNTER — Inpatient Hospital Stay: Payer: Medicare Other | Attending: Radiation Oncology

## 2018-12-16 DIAGNOSIS — C61 Malignant neoplasm of prostate: Secondary | ICD-10-CM | POA: Diagnosis not present

## 2018-12-16 DIAGNOSIS — Z510 Encounter for antineoplastic radiation therapy: Secondary | ICD-10-CM | POA: Diagnosis not present

## 2018-12-16 LAB — CBC
HCT: 46.4 % (ref 39.0–52.0)
Hemoglobin: 14.8 g/dL (ref 13.0–17.0)
MCH: 29.7 pg (ref 26.0–34.0)
MCHC: 31.9 g/dL (ref 30.0–36.0)
MCV: 93.2 fL (ref 80.0–100.0)
Platelets: 257 10*3/uL (ref 150–400)
RBC: 4.98 MIL/uL (ref 4.22–5.81)
RDW: 13.2 % (ref 11.5–15.5)
WBC: 10 10*3/uL (ref 4.0–10.5)
nRBC: 0 % (ref 0.0–0.2)

## 2018-12-17 ENCOUNTER — Ambulatory Visit: Payer: Medicare Other

## 2018-12-17 ENCOUNTER — Ambulatory Visit (INDEPENDENT_AMBULATORY_CARE_PROVIDER_SITE_OTHER): Payer: Medicare Other

## 2018-12-17 ENCOUNTER — Other Ambulatory Visit: Payer: Self-pay

## 2018-12-17 DIAGNOSIS — G459 Transient cerebral ischemic attack, unspecified: Secondary | ICD-10-CM | POA: Diagnosis not present

## 2018-12-17 DIAGNOSIS — R42 Dizziness and giddiness: Secondary | ICD-10-CM

## 2018-12-20 ENCOUNTER — Telehealth: Payer: Self-pay | Admitting: *Deleted

## 2018-12-20 ENCOUNTER — Ambulatory Visit
Admission: RE | Admit: 2018-12-20 | Discharge: 2018-12-20 | Disposition: A | Discharge status: Home / Self Care | Payer: Medicare Other | Source: Ambulatory Visit | Attending: Radiation Oncology | Admitting: Radiation Oncology

## 2018-12-20 ENCOUNTER — Encounter: Payer: Self-pay | Admitting: Family Medicine

## 2018-12-20 DIAGNOSIS — I77810 Thoracic aortic ectasia: Secondary | ICD-10-CM

## 2018-12-20 DIAGNOSIS — C61 Malignant neoplasm of prostate: Secondary | ICD-10-CM | POA: Diagnosis not present

## 2018-12-20 DIAGNOSIS — Z510 Encounter for antineoplastic radiation therapy: Secondary | ICD-10-CM | POA: Diagnosis not present

## 2018-12-20 NOTE — Telephone Encounter (Signed)
Results called to patient's wife, ok per DPR. She verbalized understanding. Results released to My Chart. Repeat echo order entered.

## 2018-12-20 NOTE — Telephone Encounter (Signed)
-----   Message from Jim Mu, PA-C sent at 12/20/2018  1:12 PM EST ----- Echo showed normal pump function, mild thickening of the heart, normal wall motion, mildly dilated aortic root with a trileaflet aortic valve, and a mildly to moderately dilated left atrium.   No echo findings to explain his symptoms. The mildly dilated aortic root should be followed with a repeat echo in 12 months.   Optimal BP control recommended.

## 2018-12-21 ENCOUNTER — Telehealth: Payer: Self-pay

## 2018-12-21 ENCOUNTER — Ambulatory Visit
Admission: RE | Admit: 2018-12-21 | Discharge: 2018-12-21 | Disposition: A | Discharge status: Home / Self Care | Payer: Medicare Other | Source: Ambulatory Visit | Attending: Radiation Oncology | Admitting: Radiation Oncology

## 2018-12-21 DIAGNOSIS — Z510 Encounter for antineoplastic radiation therapy: Secondary | ICD-10-CM | POA: Diagnosis not present

## 2018-12-21 DIAGNOSIS — C61 Malignant neoplasm of prostate: Secondary | ICD-10-CM | POA: Diagnosis not present

## 2018-12-21 MED ORDER — METFORMIN HCL ER 500 MG PO TB24: 1500.0000 mg | ORAL_TABLET | Freq: Every day | ORAL | 3 refills | Status: DC

## 2018-12-21 NOTE — Telephone Encounter (Signed)
Spoke with pt's wife, Barnetta Chapel (on dpr), about pt's metformin XR. According to our records and pt's last DM OV [see OV, 11/03/18], pt is to take 3 tabs daily with breakfast. Says she will remind pt to take rx as directed. Informed her I will send refill.   E-scribed refill.

## 2018-12-21 NOTE — Telephone Encounter (Signed)
E-scribed refill 

## 2018-12-22 ENCOUNTER — Ambulatory Visit: Payer: Medicare Other | Admitting: Family Medicine

## 2018-12-22 ENCOUNTER — Ambulatory Visit
Admission: RE | Admit: 2018-12-22 | Discharge: 2018-12-22 | Disposition: A | Discharge status: Home / Self Care | Payer: Medicare Other | Source: Ambulatory Visit | Attending: Radiation Oncology | Admitting: Radiation Oncology

## 2018-12-22 DIAGNOSIS — Z510 Encounter for antineoplastic radiation therapy: Secondary | ICD-10-CM | POA: Diagnosis not present

## 2018-12-22 DIAGNOSIS — C61 Malignant neoplasm of prostate: Secondary | ICD-10-CM | POA: Diagnosis not present

## 2018-12-23 ENCOUNTER — Encounter: Payer: Self-pay | Admitting: Family Medicine

## 2018-12-23 ENCOUNTER — Ambulatory Visit (INDEPENDENT_AMBULATORY_CARE_PROVIDER_SITE_OTHER): Payer: Medicare Other | Admitting: Family Medicine

## 2018-12-23 ENCOUNTER — Ambulatory Visit: Payer: Medicare Other

## 2018-12-23 VITALS — BP 126/68 | HR 73 | Temp 97.8°F | Ht 67.0 in | Wt 229.5 lb

## 2018-12-23 DIAGNOSIS — E1165 Type 2 diabetes mellitus with hyperglycemia: Secondary | ICD-10-CM | POA: Diagnosis not present

## 2018-12-23 DIAGNOSIS — IMO0002 Reserved for concepts with insufficient information to code with codable children: Secondary | ICD-10-CM

## 2018-12-23 DIAGNOSIS — G894 Chronic pain syndrome: Secondary | ICD-10-CM | POA: Diagnosis not present

## 2018-12-23 DIAGNOSIS — E11621 Type 2 diabetes mellitus with foot ulcer: Secondary | ICD-10-CM | POA: Diagnosis not present

## 2018-12-23 DIAGNOSIS — E1142 Type 2 diabetes mellitus with diabetic polyneuropathy: Secondary | ICD-10-CM | POA: Diagnosis not present

## 2018-12-23 DIAGNOSIS — L97511 Non-pressure chronic ulcer of other part of right foot limited to breakdown of skin: Secondary | ICD-10-CM

## 2018-12-23 DIAGNOSIS — G8929 Other chronic pain: Secondary | ICD-10-CM

## 2018-12-23 DIAGNOSIS — E1140 Type 2 diabetes mellitus with diabetic neuropathy, unspecified: Secondary | ICD-10-CM

## 2018-12-23 MED ORDER — ONDANSETRON HCL 4 MG PO TABS: 4.0000 mg | ORAL_TABLET | Freq: Three times a day (TID) | ORAL | 0 refills | Status: DC | PRN

## 2018-12-23 MED ORDER — AMOXICILLIN-POT CLAVULANATE 875-125 MG PO TABS: 1.0000 | ORAL_TABLET | Freq: Two times a day (BID) | ORAL | 0 refills | Status: AC

## 2018-12-23 MED ORDER — HYDROCODONE-ACETAMINOPHEN 10-325 MG PO TABS: 1.0000 | ORAL_TABLET | Freq: Three times a day (TID) | ORAL | 0 refills | Status: DC | PRN

## 2018-12-23 NOTE — Progress Notes (Signed)
BP 126/68 (BP Location: Left Arm, Patient Position: Sitting, Cuff Size: Large)   Pulse 73   Temp 97.8 F (36.6 C) (Oral)   Ht 5\' 7"  (1.702 m)   Wt 229 lb 8 oz (104.1 kg)   SpO2 94%   BMI 35.94 kg/m    CC: R great toe blister Subjective:    Patient ID: [name], male    DOB: June 20, 1946, 73 y.o.   MRN: [id number]  HPI: [name] is a 73 y.o. male presenting on 12/23/2018 for Blister (C/o blister located on right great toe. States he does not have much feeling in his feet so his wife is who noticed it. States since weather has been nicer, he started wearing sandals, which had a tear. Noticed about 1 wk ago. Pt wife has been caring for it. Concerned due to DM. )   Has been wearing sandals with nicer weather and walking. Sandal tore. At this area blister developed, then broke. Wife noticed ulcer 10 days ago. Has been treating neosporin antibiotic ointment, peroxide or alcohol washes, and dressing changes daily.  Loss of sensation from peripheral neuropathy due to diabetes.   Requests nausea medication after prostate treatments.  Increased use of hydrocodone since fall (see prior note for details) - requests refilled early this month     Relevant past medical, surgical, family and social history reviewed and updated as indicated. Interim medical history since our last visit reviewed. Allergies and medications reviewed and updated. Outpatient Medications Prior to Visit  Medication Sig Dispense Refill  . amiodarone (PACERONE) 200 MG tablet TAKE 1 TABLET(200 MG) BY MOUTH TWICE DAILY 180 tablet 3  . amLODipine (NORVASC) 5 MG tablet TAKE 1 TABLET(5 MG) BY MOUTH DAILY AS NEEDED 90 tablet 3  . diltiazem (CARDIZEM) 30 MG tablet TAKE 1 TABLET BY MOUTH EVERY DAY AS DIRECTED AS NEEDED FOR FAST HEART RATE 30 tablet 0  . ferrous sulfate 325 (65 FE) MG tablet Take 1 tablet (325 mg total) by mouth daily.    . fish oil-omega-3 fatty acids 1000 MG capsule Take 2 g by mouth daily.     .  furosemide (LASIX) 20 MG tablet TAKE 1 TABLET BY MOUTH AS NEEDED FOR FLUID 90 tablet 0  . gabapentin (NEURONTIN) 600 MG tablet Take 1 tablet (600 mg total) by mouth 4 (four) times daily as needed. 120 tablet 6  . metFORMIN (GLUCOPHAGE-XR) 500 MG 24 hr tablet Take 3 tablets (1,500 mg total) by mouth daily with breakfast. 270 tablet 3  . nitroGLYCERIN (NITROLINGUAL) 0.4 MG/SPRAY spray USE 1 SPRAY AS DIRECTED EVERY 5 MINUTES AS NEEDED 4.9 g 1  . omeprazole (PRILOSEC) 40 MG capsule TAKE 1 CAPSULE BY MOUTH EVERY DAY 90 capsule 2  . PROAIR HFA 108 (90 Base) MCG/ACT inhaler INHALE 2 PUFFS BY MOUTH EVERY 6 HOURS AS NEEDED FOR WHEEZING OR SHORTNESS OF BREATH 17 g 0  . tamsulosin (FLOMAX) 0.4 MG CAPS capsule Take 1 capsule (0.4 mg total) by mouth daily after supper. 30 capsule 6  . tiZANidine (ZANAFLEX) 4 MG tablet Take 1 tablet (4 mg total) by mouth every 8 (eight) hours as needed for muscle spasms. 250 tablet 1  . vitamin B-12 (CYANOCOBALAMIN) 1000 MCG tablet Take 1 tablet (1,000 mcg total) by mouth daily.    Marland Kitchen warfarin (COUMADIN) 5 MG tablet TAKE AS DIRECTED BY COUMADIN CLINIC 100 tablet 0  . HYDROcodone-acetaminophen (NORCO) 10-325 MG tablet Take 1 tablet by mouth every 8 (eight) hours as needed  for severe pain. 90 tablet 0  . metoprolol tartrate (LOPRESSOR) 25 MG tablet Take 1 tablet (25 mg total) by mouth as directed. Once daily at 5 pm 90 tablet 3   No facility-administered medications prior to visit.      Per HPI unless specifically indicated in ROS section below Review of Systems Objective:    BP 126/68 (BP Location: Left Arm, Patient Position: Sitting, Cuff Size: Large)   Pulse 73   Temp 97.8 F (36.6 C) (Oral)   Ht 5\' 7"  (1.702 m)   Wt 229 lb 8 oz (104.1 kg)   SpO2 94%   BMI 35.94 kg/m   Wt Readings from Last 3 Encounters:  12/23/18 229 lb 8 oz (104.1 kg)  12/15/18 223 lb 4 oz (101.3 kg)  12/13/18 223 lb 8 oz (101.4 kg)    Physical Exam Vitals signs and nursing note reviewed.    Constitutional:      Appearance: Normal appearance.  Musculoskeletal: Normal range of motion.  Skin:    General: Skin is warm and dry.     Findings: Wound present.     Comments: R plantar great toe with 2.5x1cm open ulcer at site of previous blister, without significant drainage or erythema, clean base  No bone exposure Diminished sensation throughout foot  Neurological:     Mental Status: He is alert.       Results for orders placed or performed in visit on 12/16/18  CBC  Result Value Ref Range   WBC 10.0 4.0 - 10.5 K/uL   RBC 4.98 4.22 - 5.81 MIL/uL   Hemoglobin 14.8 13.0 - 17.0 g/dL   HCT 46.4 39.0 - 52.0 %   MCV 93.2 80.0 - 100.0 fL   MCH 29.7 26.0 - 34.0 pg   MCHC 31.9 30.0 - 36.0 g/dL   RDW 13.2 11.5 - 15.5 %   Platelets 257 150 - 400 K/uL   nRBC 0.0 0.0 - 0.2 %   Lab Results  Component Value Date   HGBA1C 8.2 (H) 11/03/2018    Assessment & Plan:   Problem List Items Addressed This Visit    Type 2 diabetes, uncontrolled, with neuropathy ([place])   Encounter for chronic pain management    [place] CSRS reviewed      Diabetic ulcer of toe of right foot associated with type 2 diabetes mellitus, limited to breakdown of skin ([place]) - Primary    Wound seems to be clean and dry however given size and diabetic will refer to wound clinic to follow for healing. In interim, start augmentin 7d course. Pt agrees with plan.  Discussed wound care until seen next week at wound clinic.       Relevant Orders   Ambulatory referral to Wound Clinic   Diabetic polyneuropathy ([place]) (Chronic)   Chronic pain syndrome (Chronic)    Ok to do early refill of hydrocodone after his fall on iced ramp earlier this month.       Relevant Medications   HYDROcodone-acetaminophen (NORCO) 10-325 MG tablet       Meds ordered this encounter  Medications  . ondansetron (ZOFRAN) 4 MG tablet    Sig: Take 1 tablet (4 mg total) by mouth every 8 (eight) hours as needed for nausea or vomiting.     Dispense:  20 tablet    Refill:  0  . amoxicillin-clavulanate (AUGMENTIN) 875-125 MG tablet    Sig: Take 1 tablet by mouth 2 (two) times daily for 7 days.  Dispense:  14 tablet    Refill:  0  . HYDROcodone-acetaminophen (NORCO) 10-325 MG tablet    Sig: Take 1 tablet by mouth every 8 (eight) hours as needed for up to 30 days for severe pain.    Dispense:  90 tablet    Refill:  0    May refill on 12/25/2018   Orders Placed This Encounter  Procedures  . Ambulatory referral to Wound Clinic    Referral Priority:   Routine    Referral Type:   Consultation    Referral Reason:   Specialty Services Required    Requested Specialty:   Wound Care    Number of Visits Requested:   1   Patient Instructions  Stop peroxide and alcohol. Continue dressing changes daily with antibiotic ointment. Start antibiotic sent to pharmacy.  We will refer you to wound clinic for monitoring.   Follow up plan: No follow-ups on file.  Ria Bush, MD

## 2018-12-23 NOTE — Assessment & Plan Note (Deleted)
Wound seems to be clean and dry however given size and diabetic will refer to wound clinic to follow for healing. In interim, start augmentin 7d course. Pt agrees with plan.  Discussed wound care until seen next week at wound clinic.

## 2018-12-23 NOTE — Patient Instructions (Signed)
Stop peroxide and alcohol. Continue dressing changes daily with antibiotic ointment. Start antibiotic sent to pharmacy.  We will refer you to wound clinic for monitoring.

## 2018-12-23 NOTE — Assessment & Plan Note (Signed)
[place] CSRS reviewed  ?

## 2018-12-23 NOTE — Assessment & Plan Note (Signed)
Ok to do early refill of hydrocodone after his fall on iced ramp earlier this month.

## 2018-12-23 NOTE — Assessment & Plan Note (Signed)
Wound seems to be clean and dry however given size and diabetic will refer to wound clinic to follow for healing. In interim, start augmentin 7d course. Pt agrees with plan.  Discussed wound care until seen next week at wound clinic.

## 2018-12-24 ENCOUNTER — Ambulatory Visit
Admission: RE | Admit: 2018-12-24 | Discharge: 2018-12-24 | Disposition: A | Discharge status: Home / Self Care | Payer: Medicare Other | Source: Ambulatory Visit | Attending: Radiation Oncology | Admitting: Radiation Oncology

## 2018-12-24 DIAGNOSIS — Z510 Encounter for antineoplastic radiation therapy: Secondary | ICD-10-CM | POA: Diagnosis not present

## 2018-12-24 DIAGNOSIS — C61 Malignant neoplasm of prostate: Secondary | ICD-10-CM | POA: Diagnosis not present

## 2018-12-27 ENCOUNTER — Ambulatory Visit
Admission: RE | Admit: 2018-12-27 | Discharge: 2018-12-27 | Disposition: A | Discharge status: Home / Self Care | Payer: Medicare Other | Source: Ambulatory Visit | Attending: Radiation Oncology | Admitting: Radiation Oncology

## 2018-12-27 DIAGNOSIS — Z510 Encounter for antineoplastic radiation therapy: Secondary | ICD-10-CM | POA: Diagnosis not present

## 2018-12-27 DIAGNOSIS — C61 Malignant neoplasm of prostate: Secondary | ICD-10-CM | POA: Diagnosis not present

## 2018-12-28 ENCOUNTER — Ambulatory Visit
Admission: RE | Admit: 2018-12-28 | Discharge: 2018-12-28 | Disposition: A | Discharge status: Home / Self Care | Payer: Medicare Other | Source: Ambulatory Visit | Attending: Radiation Oncology | Admitting: Radiation Oncology

## 2018-12-28 DIAGNOSIS — C61 Malignant neoplasm of prostate: Secondary | ICD-10-CM | POA: Diagnosis not present

## 2018-12-28 DIAGNOSIS — Z510 Encounter for antineoplastic radiation therapy: Secondary | ICD-10-CM | POA: Diagnosis not present

## 2018-12-29 ENCOUNTER — Ambulatory Visit
Admission: RE | Admit: 2018-12-29 | Discharge: 2018-12-29 | Disposition: A | Discharge status: Home / Self Care | Payer: Medicare Other | Source: Ambulatory Visit | Attending: Radiation Oncology | Admitting: Radiation Oncology

## 2018-12-29 ENCOUNTER — Encounter: Payer: Medicare Other | Attending: Internal Medicine | Admitting: Internal Medicine

## 2018-12-29 DIAGNOSIS — E538 Deficiency of other specified B group vitamins: Secondary | ICD-10-CM | POA: Diagnosis not present

## 2018-12-29 DIAGNOSIS — E11622 Type 2 diabetes mellitus with other skin ulcer: Secondary | ICD-10-CM | POA: Diagnosis not present

## 2018-12-29 DIAGNOSIS — C61 Malignant neoplasm of prostate: Secondary | ICD-10-CM | POA: Diagnosis not present

## 2018-12-29 DIAGNOSIS — L97511 Non-pressure chronic ulcer of other part of right foot limited to breakdown of skin: Secondary | ICD-10-CM | POA: Insufficient documentation

## 2018-12-29 DIAGNOSIS — I4891 Unspecified atrial fibrillation: Secondary | ICD-10-CM | POA: Diagnosis not present

## 2018-12-29 DIAGNOSIS — Z8249 Family history of ischemic heart disease and other diseases of the circulatory system: Secondary | ICD-10-CM | POA: Insufficient documentation

## 2018-12-29 DIAGNOSIS — M1990 Unspecified osteoarthritis, unspecified site: Secondary | ICD-10-CM | POA: Insufficient documentation

## 2018-12-29 DIAGNOSIS — I714 Abdominal aortic aneurysm, without rupture: Secondary | ICD-10-CM | POA: Insufficient documentation

## 2018-12-29 DIAGNOSIS — Z7901 Long term (current) use of anticoagulants: Secondary | ICD-10-CM | POA: Insufficient documentation

## 2018-12-29 DIAGNOSIS — Z8546 Personal history of malignant neoplasm of prostate: Secondary | ICD-10-CM | POA: Diagnosis not present

## 2018-12-29 DIAGNOSIS — E1142 Type 2 diabetes mellitus with diabetic polyneuropathy: Secondary | ICD-10-CM | POA: Insufficient documentation

## 2018-12-29 DIAGNOSIS — I10 Essential (primary) hypertension: Secondary | ICD-10-CM | POA: Insufficient documentation

## 2018-12-29 DIAGNOSIS — L97512 Non-pressure chronic ulcer of other part of right foot with fat layer exposed: Secondary | ICD-10-CM | POA: Diagnosis not present

## 2018-12-29 DIAGNOSIS — E11621 Type 2 diabetes mellitus with foot ulcer: Secondary | ICD-10-CM | POA: Diagnosis not present

## 2018-12-29 DIAGNOSIS — Z510 Encounter for antineoplastic radiation therapy: Secondary | ICD-10-CM | POA: Diagnosis not present

## 2018-12-30 ENCOUNTER — Ambulatory Visit
Admission: RE | Admit: 2018-12-30 | Discharge: 2018-12-30 | Disposition: A | Discharge status: Home / Self Care | Payer: Medicare Other | Source: Ambulatory Visit | Attending: Radiation Oncology | Admitting: Radiation Oncology

## 2018-12-30 ENCOUNTER — Inpatient Hospital Stay: Payer: Medicare Other

## 2018-12-30 DIAGNOSIS — C61 Malignant neoplasm of prostate: Secondary | ICD-10-CM | POA: Diagnosis not present

## 2018-12-30 DIAGNOSIS — Z510 Encounter for antineoplastic radiation therapy: Secondary | ICD-10-CM | POA: Diagnosis not present

## 2018-12-30 NOTE — Progress Notes (Signed)
COALTON, ARCH ([id number]) Visit Report for 12/29/2018 Abuse/Suicide Risk Screen Details Patient Name: [name], [name] Date of Service: 12/29/2018 8:45 AM Medical Record Number: [id number] Patient Account Number: [id number] Date of Birth/Sex: Dec 26, 1945 (73 y.o. M) Treating RN: Montey Hora Primary Care Jhonnie Aliano: Ria Bush Other Clinician: Referring Dewon Mendizabal: Ria Bush Treating Terique Kawabata/Extender: Tito Dine in Treatment: 0 Abuse/Suicide Risk Screen Items Answer ABUSE/SUICIDE RISK SCREEN: Has anyone close to you tried to hurt or harm you recentlyo No Do you feel uncomfortable with anyone in your familyo No Has anyone forced you do things that you didnot want to doo No Do you have any thoughts of harming yourselfo No Patient displays signs or symptoms of abuse and/or neglect. No Electronic Signature(s) Signed: 12/29/2018 5:29:12 PM By: Montey Hora Entered By: Montey Hora on 12/29/2018 09:21:36 [name] ([id number]) -------------------------------------------------------------------------------- Activities of Daily Living Details Patient Name: [name] Date of Service: 12/29/2018 8:45 AM Medical Record Number: [id number] Patient Account Number: [id number] Date of Birth/Sex: Jun 27, 1946 (73 y.o. M) Treating RN: Montey Hora Primary Care Windsor Zirkelbach: Ria Bush Other Clinician: Referring Ladana Chavero: Ria Bush Treating Brinsley Wence/Extender: Tito Dine in Treatment: 0 Activities of Daily Living Items Answer Activities of Daily Living (Please select one for each item) Drive Automobile Completely Able Take Medications Completely Able Use Telephone Completely Able Care for Appearance Completely Able Use Toilet Completely Able Bath / Shower Completely Able Dress Self Completely Able Feed Self Completely Able Walk Completely Able Get In / Out Bed Completely Able Housework Completely Able Prepare Meals Completely  Sebastian for Self Completely Able Electronic Signature(s) Signed: 12/29/2018 5:29:12 PM By: Montey Hora Entered By: Montey Hora on 12/29/2018 09:21:59 [name] ([id number]) -------------------------------------------------------------------------------- Education Assessment Details Patient Name: [name] Date of Service: 12/29/2018 8:45 AM Medical Record Number: [id number] Patient Account Number: [id number] Date of Birth/Sex: [birth year] (73 y.o. M) Treating RN: Montey Hora Primary Care Megahn Killings: Ria Bush Other Clinician: Referring Sharri Loya: Ria Bush Treating Doris Mcgilvery/Extender: Tito Dine in Treatment: 0 Primary Learner Assessed: Patient Learning Preferences/Education Level/Primary Language Learning Preference: Explanation, Demonstration Highest Education Level: College or Above Preferred Language: English Cognitive Barrier Assessment/Beliefs Language Barrier: No Translator Needed: No Memory Deficit: No Emotional Barrier: No Cultural/Religious Beliefs Affecting Medical Care: No Physical Barrier Assessment Impaired Vision: No Impaired Hearing: No Decreased Hand dexterity: No Knowledge/Comprehension Assessment Knowledge Level: Medium Comprehension Level: Medium Ability to understand written Medium instructions: Ability to understand verbal Medium instructions: Motivation Assessment Anxiety Level: Calm Cooperation: Cooperative Education Importance: Acknowledges Need Interest in Health Problems: Asks Questions Perception: Coherent Willingness to Engage in Self- Medium Management Activities: Readiness to Engage in Self- Medium Management Activities: Electronic Signature(s) Signed: 12/29/2018 5:29:12 PM By: Montey Hora Entered By: Montey Hora on 12/29/2018 09:22:27 [name] ([id number]) -------------------------------------------------------------------------------- Fall Risk  Assessment Details Patient Name: [name] Date of Service: 12/29/2018 8:45 AM Medical Record Number: [id number] Patient Account Number: [id number] Date of Birth/Sex: [birth year] (73 y.o. M) Treating RN: Montey Hora Primary Care Surah Pelley: Ria Bush Other Clinician: Referring Florine Sprenkle: Ria Bush Treating Sawsan Riggio/Extender: Tito Dine in Treatment: 0 Fall Risk Assessment Items Have you had 2 or more falls in the last 12 monthso 0 Yes Have you had any fall that resulted in injury in the last 12 monthso 0 Yes FALL RISK ASSESSMENT: History of falling - immediate or within 3 months 25 Yes Secondary diagnosis 0 No Ambulatory aid None/bed rest/wheelchair/nurse 0 No Crutches/cane/walker 15 Yes Furniture  0 No IV Access/Saline Lock 0 No Gait/Training Normal/bed rest/immobile 0 No Weak 10 Yes Impaired 0 No Mental Status Oriented to own ability 0 Yes Electronic Signature(s) Signed: 12/29/2018 5:29:12 PM By: Montey Hora Entered By: Montey Hora on 12/29/2018 09:22:58 [name] ([id number]) -------------------------------------------------------------------------------- Foot Assessment Details Patient Name: [name] Date of Service: 12/29/2018 8:45 AM Medical Record Number: [id number] Patient Account Number: [id number] Date of Birth/Sex: [birth year] (73 y.o. M) Treating RN: Montey Hora Primary Care Soyla Bainter: Ria Bush Other Clinician: Referring Ione [place]: Ria Bush Treating Ozro Russett/Extender: Tito Dine in Treatment: 0 Foot Assessment Items Site Locations + = Sensation present, - = Sensation absent, C = Callus, U = Ulcer R = Redness, W = Warmth, M = Maceration, PU = Pre-ulcerative lesion F = Fissure, S = Swelling, D = Dryness Assessment Right: Left: Other Deformity: No No Prior Foot Ulcer: No No Prior Amputation: No No Charcot Joint: No No Ambulatory Status: Ambulatory With Help Assistance Device:  Cane Gait: Steady Electronic Signature(s) Signed: 12/29/2018 5:29:12 PM By: Montey Hora Entered By: Montey Hora on 12/29/2018 09:36:03 [name] ([id number]) -------------------------------------------------------------------------------- Nutrition Risk Assessment Details Patient Name: [name] Date of Service: 12/29/2018 8:45 AM Medical Record Number: [id number] Patient Account Number: [id number] Date of Birth/Sex: 1946-07-01 (73 y.o. M) Treating RN: Montey Hora Primary Care Myisha Pickerel: Ria Bush Other Clinician: Referring Airyonna Franklyn: Ria Bush Treating Francy Mcilvaine/Extender: Tito Dine in Treatment: 0 Height (in): 72 Weight (lbs): 224 Body Mass Index (BMI): 30.4 Nutrition Risk Assessment Items NUTRITION RISK SCREEN: I have an illness or condition that made me change the kind and/or amount of 0 No food I eat I eat fewer than two meals per day 0 No I eat few fruits and vegetables, or milk products 0 No I have three or more drinks of beer, liquor or wine almost every day 0 No I have tooth or mouth problems that make it hard for me to eat 0 No I don't always have enough money to buy the food I need 0 No I eat alone most of the time 0 No I take three or more different prescribed or over-the-counter drugs a day 1 Yes Without wanting to, I have lost or gained 10 pounds in the last six months 0 No I am not always physically able to shop, cook and/or feed myself 0 No Nutrition Protocols Good Risk Protocol 0 No interventions needed Moderate Risk Protocol Electronic Signature(s) Signed: 12/29/2018 5:29:12 PM By: Montey Hora Entered By: Montey Hora on 12/29/2018 09:23:04

## 2018-12-31 ENCOUNTER — Ambulatory Visit
Admission: RE | Admit: 2018-12-31 | Discharge: 2018-12-31 | Disposition: A | Discharge status: Home / Self Care | Payer: Medicare Other | Source: Ambulatory Visit | Attending: Radiation Oncology | Admitting: Radiation Oncology

## 2018-12-31 ENCOUNTER — Ambulatory Visit: Payer: Medicare Other

## 2018-12-31 DIAGNOSIS — C61 Malignant neoplasm of prostate: Secondary | ICD-10-CM | POA: Diagnosis not present

## 2018-12-31 DIAGNOSIS — Z510 Encounter for antineoplastic radiation therapy: Secondary | ICD-10-CM | POA: Diagnosis not present

## 2018-12-31 NOTE — Progress Notes (Signed)
Jim, Moore ([id number]) Visit Report for 12/29/2018 Chief Complaint Document Details Patient Name: [name], [name] Date of Service: 12/29/2018 8:45 AM Medical Record Number: [id number] Patient Account Number: [id number] Date of Birth/Sex: [birth year] (72 y.o. M) Treating RN: Cornell Barman Primary Care Provider: Ria Bush Other Clinician: Referring Provider: Ria Bush Treating Provider/Extender: Tito Dine in Treatment: 0 Information Obtained from: Patient Chief Complaint 12/29/2018; patient is here for review of a wound on the plantar right great toe Electronic Signature(s) Signed: 12/29/2018 5:53:07 PM By: Linton Ham MD Entered By: Linton Ham on 12/29/2018 12:26:13 [name] ([id number]) -------------------------------------------------------------------------------- Debridement Details Patient Name: [name] Date of Service: 12/29/2018 8:45 AM Medical Record Number: [id number] Patient Account Number: [id number] Date of Birth/Sex: 22-Aug-1946 (72 y.o. M) Treating RN: Cornell Barman Primary Care Provider: Ria Bush Other Clinician: Referring Provider: Ria Bush Treating Provider/Extender: Tito Dine in Treatment: 0 Debridement Performed for Wound #1 Right,Plantar Toe Great Assessment: Performed By: Physician Ricard Dillon, MD Debridement Type: Debridement Severity of Tissue Pre Fat layer exposed Debridement: Level of Consciousness (Pre- Awake and Alert procedure): Pre-procedure Verification/Time Yes - 10:10 Out Taken: Start Time: 10:10 Pain Control: Lidocaine Total Area Debrided (L x W): 2 (cm) x 1 (cm) = 2 (cm) Tissue and other material Viable, Non-Viable, Slough, Subcutaneous, Slough debrided: Level: Skin/Subcutaneous Tissue Debridement Description: Excisional Instrument: Curette Bleeding: Minimum Hemostasis Achieved: Pressure End Time: 10:16 Procedural Pain: 0 Post Procedural Pain:  0 Response to Treatment: Procedure was tolerated well Level of Consciousness Awake and Alert (Post-procedure): Post Debridement Measurements of Total Wound Length: (cm) 2 Width: (cm) 1 Depth: (cm) 0.1 Volume: (cm) 0.157 Character of Wound/Ulcer Post Debridement: Stable Severity of Tissue Post Debridement: Fat layer exposed Post Procedure Diagnosis Same as Pre-procedure Electronic Signature(s) Signed: 12/29/2018 5:53:07 PM By: Linton Ham MD Signed: 12/30/2018 5:43:44 PM By: Gretta Cool, BSN, RN, CWS, Kim RN, BSN Entered By: Linton Ham on 12/29/2018 12:25:40 [name] ([id number]) -------------------------------------------------------------------------------- HPI Details Patient Name: [name] Date of Service: 12/29/2018 8:45 AM Medical Record Number: [id number] Patient Account Number: [id number] Date of Birth/Sex: [birth year] (72 y.o. M) Treating RN: Cornell Barman Primary Care Provider: Ria Bush Other Clinician: Referring Provider: Ria Bush Treating Provider/Extender: Tito Dine in Treatment: 0 History of Present Illness HPI Description: ADMISSION 12/29/2018 This is a 73 year old man with type 2 diabetes and a history of neuropathy. 3 weeks ago he noted an open area on the plantar aspect of his right great toe or more precisely his wife noticed it. They think it might of come from a problem with the sole of the shoe he was using. In any case this had considerable size that he was seen on 12/23/2018 by his primary doctor. At that point it was stated that he had been using peroxide and topical alcohol. He was given an antibiotic ointment and put on oral Augmentin. Since then the wound has contracted according to the patient. He does have diabetic neuropathy but does not have significant arterial disease. He had an evaluation in 2017 at [facility]. This included an ABI in the right of 1.13 on the left at 1.10 there was not felt to be any evidence of  obstruction. I do not see TBI's or waveforms however the patient was not felt to have hemodynamically significant obstruction. ABIs in our clinic were 1.07 on the right and 1.02 on the left Past medical history includes A. fib on Coumadin, prostate cancer, thoracic and abdominal aortic aneurysm,  vitamin B12 deficiency, iron deficiency and type 2 diabetes Electronic Signature(s) Signed: 12/29/2018 5:53:07 PM By: Linton Ham MD Entered By: Linton Ham on 12/29/2018 12:29:14 [name] ([id number]) -------------------------------------------------------------------------------- Physical Exam Details Patient Name: [name] Date of Service: 12/29/2018 8:45 AM Medical Record Number: [id number] Patient Account Number: [id number] Date of Birth/Sex: November 22, 1946 (72 y.o. M) Treating RN: Cornell Barman Primary Care Provider: Ria Bush Other Clinician: Referring Provider: Ria Bush Treating Provider/Extender: Tito Dine in Treatment: 0 Constitutional Sitting or standing Blood Pressure is within target range for patient.. Pulse regular and within target range for patient.Marland Kitchen Respirations regular, non-labored and within target range.. Temperature is normal and within the target range for the patient.Marland Kitchen appears in no distress. Eyes Conjunctivae clear. No discharge. Respiratory Respiratory effort is easy and symmetric bilaterally. Rate is normal at rest and on room air.. Bilateral breath sounds are clear and equal in all lobes with no wheezes, rales or rhonchi.. Cardiovascular Patient appears euvolemic. Femoral pulses palpable as were popliteal. Pedal pulses palpable on the right. Lymphatic None palpable in the popliteal or inguinal area. Musculoskeletal No tenderness in the right inner phalangeal joint of the great toe or the MTP. Integumentary (Hair, Skin) No primary skin issues were seen. Psychiatric No evidence of depression, anxiety, or agitation. Calm,  cooperative, and communicative. Appropriate interactions and affect.. Notes Wound exam; this is on the plantar tip of the right great toe. Oval-shaped wound. Nonviable surface debrided with a #5 curette to remove very adherent fibrinous necrotic debris from the surface. Tissue surrounding this looked healthy in fact you can see where there was probably a much larger wound at one point given the surface over the wound currently I am a bit surprised that this improved and apparently quite quickly Electronic Signature(s) Signed: 12/29/2018 5:53:07 PM By: Linton Ham MD Entered By: Linton Ham on 12/29/2018 12:31:16 [name] ([id number]) -------------------------------------------------------------------------------- Physician Orders Details Patient Name: [name] Date of Service: 12/29/2018 8:45 AM Medical Record Number: [id number] Patient Account Number: [id number] Date of Birth/Sex: 1946-06-14 (72 y.o. M) Treating RN: Cornell Barman Primary Care Provider: Ria Bush Other Clinician: Referring Provider: Ria Bush Treating Provider/Extender: Tito Dine in Treatment: 0 Verbal / Phone Orders: No Diagnosis Coding Wound Cleansing Wound #1 Right,Plantar Toe Great o Cleanse wound with mild soap and water Anesthetic (add to Medication List) Wound #1 Right,Plantar Toe Great o Topical Lidocaine 4% cream applied to wound bed prior to debridement (In Clinic Only). o Benzocaine Topical Anesthetic Spray applied to wound bed prior to debridement (In Clinic Only). Primary Wound Dressing Wound #1 Right,Plantar Toe Great o Silver Collagen Secondary Dressing Wound #1 Right,Plantar Toe Great o Other - Offloading felt, gauze, conform Dressing Change Frequency Wound #1 Right,Plantar Toe Great o Change dressing every day. Follow-up Appointments Wound #1 Right,Plantar Toe Great o Return Appointment in 1 week. Off-Loading Wound #1 Right,Plantar  Toe Great o Other: - Keep Pressure off of foot. Electronic Signature(s) Signed: 12/29/2018 5:53:07 PM By: Linton Ham MD Signed: 12/30/2018 5:43:44 PM By: Gretta Cool, BSN, RN, CWS, Kim RN, BSN Entered By: Gretta Cool, BSN, RN, CWS, Kim on 12/29/2018 10:18:24 [name] ([id number]) -------------------------------------------------------------------------------- Problem List Details Patient Name: [name] Date of Service: 12/29/2018 8:45 AM Medical Record Number: [id number] Patient Account Number: [id number] Date of Birth/Sex: September 18, 1946 (72 y.o. M) Treating RN: Cornell Barman Primary Care Provider: Ria Bush Other Clinician: Referring Provider: Ria Bush Treating Provider/Extender: Tito Dine in Treatment: 0 Active Problems ICD-10  Evaluated Encounter Code Description Active Date Today Diagnosis E11.621 Type 2 diabetes mellitus with foot ulcer 12/29/2018 No Yes 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 to 12/29/2018 No Yes breakdown of skin E11.42 Type 2 diabetes mellitus with diabetic polyneuropathy 12/29/2018 No Yes Inactive Problems Resolved Problems Electronic Signature(s) Signed: 12/29/2018 5:53:07 PM By: Linton Ham MD Entered By: Linton Ham on 12/29/2018 12:25:12 [name] ([id number]) -------------------------------------------------------------------------------- Progress Note Details Patient Name: [name] Date of Service: 12/29/2018 8:45 AM Medical Record Number: [id number] Patient Account Number: [id number] Date of Birth/Sex: Mar 26, 1946 (73 y.o. M) Treating RN: Cornell Barman Primary Care Provider: Ria Bush Other Clinician: Referring Provider: Ria Bush Treating Provider/Extender: Tito Dine in Treatment: 0 Subjective Chief Complaint Information obtained from Patient 12/29/2018; patient is here for review of a wound on the plantar right great toe History of Present  Illness (HPI) ADMISSION 12/29/2018 This is a 73 year old man with type 2 diabetes and a history of neuropathy. 3 weeks ago he noted an open area on the plantar aspect of his right great toe or more precisely his wife noticed it. They think it might of come from a problem with the sole of the shoe he was using. In any case this had considerable size that he was seen on 12/23/2018 by his primary doctor. At that point it was stated that he had been using peroxide and topical alcohol. He was given an antibiotic ointment and put on oral Augmentin. Since then the wound has contracted according to the patient. He does have diabetic neuropathy but does not have significant arterial disease. He had an evaluation in 2017 at [facility]. This included an ABI in the right of 1.13 on the left at 1.10 there was not felt to be any evidence of obstruction. I do not see TBI's or waveforms however the patient was not felt to have hemodynamically significant obstruction. ABIs in our clinic were 1.07 on the right and 1.02 on the left Past medical history includes A. fib on Coumadin, prostate cancer, thoracic and abdominal aortic aneurysm, vitamin B12 deficiency, iron deficiency and type 2 diabetes Wound History Patient presents with 1 open wound that has been present for approximately 2 1/2 weeks. Patient has been treating wound in the following manner: neosporin. Laboratory tests have not been performed in the last month. Patient reportedly has not tested positive for an antibiotic resistant organism. Patient reportedly has not tested positive for osteomyelitis. Patient reportedly has had testing performed to evaluate circulation in the legs. Patient History Information obtained from Patient, Chart. Allergies No Known Drug Allergies Family History Cancer - Siblings, Diabetes - Father,Mother,Siblings, Heart Disease - Father, Hypertension - Father, Lung Disease - Father, No family history of Hereditary Spherocytosis,  Kidney Disease, Seizures, Stroke, Thyroid Problems, Tuberculosis. Social History Current every day smoker, Marital Status - Married, Alcohol Use - Rarely, Drug Use - No History, Caffeine Use - Daily. Medical History Eyes Denies history of Cataracts, Glaucoma, Optic Neuritis BROOK, MALL ([id number]) Ear/Nose/Mouth/Throat Denies history of Chronic sinus problems/congestion, Middle ear problems Hematologic/Lymphatic Denies history of Anemia, Hemophilia, Human Immunodeficiency Virus, Lymphedema, Sickle Cell Disease Respiratory Patient has history of Sleep Apnea Denies history of Aspiration, Asthma, Chronic Obstructive Pulmonary Disease (COPD), Pneumothorax, Tuberculosis Cardiovascular Patient has history of Arrhythmia - a fib, Hypertension Denies history of Angina, Congestive Heart Failure, Coronary Artery Disease, Deep Vein Thrombosis, Hypotension, Myocardial Infarction, Peripheral Arterial Disease, Peripheral Venous Disease, Phlebitis, Vasculitis Gastrointestinal Denies history of Cirrhosis , Colitis, Crohn  s, Hepatitis A, Hepatitis B, Hepatitis C Endocrine Patient has history of Type II Diabetes Denies history of Type I Diabetes Genitourinary Denies history of End Stage Renal Disease Immunological Denies history of Lupus Erythematosus, Raynaud s, Scleroderma Integumentary (Skin) Denies history of History of Burn, History of pressure wounds Musculoskeletal Patient has history of Osteoarthritis Denies history of Gout, Rheumatoid Arthritis, Osteomyelitis Neurologic Patient has history of Neuropathy Denies history of Dementia, Quadriplegia, Paraplegia, Seizure Disorder Oncologic Patient has history of Received Radiation Denies history of Received Chemotherapy Psychiatric Denies history of Anorexia/bulimia, Confinement Anxiety Patient is treated with Oral Agents. Blood sugar is tested. Medical And Surgical History Notes Oncologic prostate cancer Review of Systems  (ROS) Constitutional Symptoms (General Health) Denies complaints or symptoms of Fatigue, Fever, Chills, Marked Weight Change. Eyes Denies complaints or symptoms of Dry Eyes, Vision Changes, Glasses / Contacts. Ear/Nose/Mouth/Throat Denies complaints or symptoms of Difficult clearing ears, Sinusitis. Hematologic/Lymphatic Denies complaints or symptoms of Bleeding / Clotting Disorders, Human Immunodeficiency Virus. Respiratory Denies complaints or symptoms of Chronic or frequent coughs, Shortness of Breath. Cardiovascular Denies complaints or symptoms of Chest pain, LE edema. Gastrointestinal Denies complaints or symptoms of Frequent diarrhea, Nausea, Vomiting. Endocrine Denies complaints or symptoms of Hepatitis, Thyroid disease, Polydypsia (Excessive Thirst). Genitourinary Denies complaints or symptoms of Kidney failure/ Dialysis, Incontinence/dribbling. Immunological ANTWOINE, ZORN ([id number]) Denies complaints or symptoms of Hives, Itching. Integumentary (Skin) Complains or has symptoms of Wounds. Denies complaints or symptoms of Bleeding or bruising tendency, Breakdown, Swelling. Musculoskeletal Denies complaints or symptoms of Muscle Pain, Muscle Weakness. Neurologic Denies complaints or symptoms of Numbness/parasthesias, Focal/Weakness. Psychiatric Denies complaints or symptoms of Anxiety, Claustrophobia. Objective Constitutional Sitting or standing Blood Pressure is within target range for patient.. Pulse regular and within target range for patient.Marland Kitchen Respirations regular, non-labored and within target range.. Temperature is normal and within the target range for the patient.Marland Kitchen appears in no distress. Vitals Time Taken: 9:14 AM, Height: 72 in, Source: Stated, Weight: 224 lbs, Source: Measured, BMI: 30.4, Temperature: 97.6 F, Pulse: 65 bpm, Respiratory Rate: 16 breaths/min, Blood Pressure: 116/75 mmHg. Eyes Conjunctivae clear. No discharge. Respiratory Respiratory  effort is easy and symmetric bilaterally. Rate is normal at rest and on room air.. Bilateral breath sounds are clear and equal in all lobes with no wheezes, rales or rhonchi.. Cardiovascular Patient appears euvolemic. Femoral pulses palpable as were popliteal. Pedal pulses palpable on the right. Lymphatic None palpable in the popliteal or inguinal area. Musculoskeletal No tenderness in the right inner phalangeal joint of the great toe or the MTP. Psychiatric No evidence of depression, anxiety, or agitation. Calm, cooperative, and communicative. Appropriate interactions and affect.. General Notes: Wound exam; this is on the plantar tip of the right great toe. Oval-shaped wound. Nonviable surface debrided with a #5 curette to remove very adherent fibrinous necrotic debris from the surface. Tissue surrounding this looked healthy in fact you can see where there was probably a much larger wound at one point given the surface over the wound currently I am a bit surprised that this improved and apparently quite quickly Integumentary (Hair, Skin) No primary skin issues were seen. Wound #1 status is Open. Original cause of wound was Blister. The wound is located on the [employer]. The ALBIN, DUCKETT. ([id number]) wound measures 2cm length x 1cm width x 0.1cm depth; 1.571cm^2 area and 0.157cm^3 volume. There is Fat Layer (Subcutaneous Tissue) Exposed exposed. There is no tunneling or undermining noted. There is a medium amount of serous drainage noted. The  wound margin is flat and intact. There is medium (34-66%) pink granulation within the wound bed. There is a medium (34-66%) amount of necrotic tissue within the wound bed including Adherent Slough. The periwound skin appearance exhibited: Callus. The periwound skin appearance did not exhibit: Crepitus, Excoriation, Induration, Rash, Scarring, Dry/Scaly, Maceration, Atrophie Blanche, Cyanosis, Ecchymosis, Hemosiderin Staining, Mottled,  Pallor, Rubor, Erythema. Periwound temperature was noted as No Abnormality. The periwound has tenderness on palpation. Assessment Active Problems ICD-10 Type 2 diabetes mellitus with foot ulcer Non-pressure chronic ulcer of other part of right foot limited to breakdown of skin Type 2 diabetes mellitus with diabetic polyneuropathy Procedures Wound #1 Pre-procedure diagnosis of Wound #1 is a Diabetic Wound/Ulcer of the Lower Extremity located on the Right,Plantar Toe Great .Severity of Tissue Pre Debridement is: Fat layer exposed. There was a Excisional Skin/Subcutaneous Tissue Debridement with a total area of 2 sq cm performed by Ricard Dillon, MD. With the following instrument(s): Curette to remove Viable and Non-Viable tissue/material. Material removed includes Subcutaneous Tissue and Slough and after achieving pain control using Lidocaine. No specimens were taken. A time out was conducted at 10:10, prior to the start of the procedure. A Minimum amount of bleeding was controlled with Pressure. The procedure was tolerated well with a pain level of 0 throughout and a pain level of 0 following the procedure. Post Debridement Measurements: 2cm length x 1cm width x 0.1cm depth; 0.157cm^3 volume. Character of Wound/Ulcer Post Debridement is stable. Severity of Tissue Post Debridement is: Fat layer exposed. Post procedure Diagnosis Wound #1: Same as Pre-Procedure Plan Wound Cleansing: Wound #1 Right,Plantar Toe Great: Cleanse wound with mild soap and water Anesthetic (add to Medication List): Wound #1 Right,Plantar Toe Great: Topical Lidocaine 4% cream applied to wound bed prior to debridement (In Clinic Only). Benzocaine Topical Anesthetic Spray applied to wound bed prior to debridement (In Clinic Only). Primary Wound Dressing: Wound #1 Right,Plantar Toe Great: Silver Collagen Secondary Dressing: Wound #1 Right,Plantar Toe GreatWilliam Moore ([id number]) Other - Offloading felt,  gauze, conform Dressing Change Frequency: Wound #1 Right,Plantar Toe Great: Change dressing every day. Follow-up Appointments: Wound #1 Right,Plantar Toe Great: Return Appointment in 1 week. Off-Loading: Wound #1 Right,Plantar Toe Great: Other: - Keep Pressure off of foot. 1. I change the primary dressing to silver collagen with gauze and conformer. 2. We do not have any offloading surgical shoe or Darco forefoot offloading boot to give him today. 3. His wife is changing the dressing daily 4. I did not feel that any further antibiotics or x-rays were necessary at this point. No cultures were done Electronic Signature(s) Signed: 12/29/2018 5:53:07 PM By: Linton Ham MD Entered By: Linton Ham on 12/29/2018 12:32:34 [name] ([id number]) -------------------------------------------------------------------------------- ROS/PFSH Details Patient Name: [name] Date of Service: 12/29/2018 8:45 AM Medical Record Number: [id number] Patient Account Number: [id number] Date of Birth/Sex: 09-21-1946 (72 y.o. M) Treating RN: Montey Hora Primary Care Provider: Ria Bush Other Clinician: Referring Provider: Ria Bush Treating Provider/Extender: Tito Dine in Treatment: 0 Information Obtained From Patient Chart Wound History Do you currently have one or more open woundso Yes How many open wounds do you currently haveo 1 Approximately how long have you had your woundso 2 1/2 weeks How have you been treating your wound(s) until nowo neosporin Has your wound(s) ever healed and then re-openedo No Have you had any lab work done in the past montho No Have you tested positive for an antibiotic resistant organism (MRSA, VRE)o  No Have you tested positive for osteomyelitis (bone infection)o No Have you had any tests for circulation on your legso Yes Who ordered the testo unknown Constitutional Symptoms (General Health) Complaints and  Symptoms: Negative for: Fatigue; Fever; Chills; Marked Weight Change Eyes Complaints and Symptoms: Negative for: Dry Eyes; Vision Changes; Glasses / Contacts Medical History: Negative for: Cataracts; Glaucoma; Optic Neuritis Ear/Nose/Mouth/Throat Complaints and Symptoms: Negative for: Difficult clearing ears; Sinusitis Medical History: Negative for: Chronic sinus problems/congestion; Middle ear problems Hematologic/Lymphatic Complaints and Symptoms: Negative for: Bleeding / Clotting Disorders; Human Immunodeficiency Virus Medical History: Negative for: Anemia; Hemophilia; Human Immunodeficiency Virus; Lymphedema; Sickle Cell Disease Respiratory Complaints and Symptoms: Negative for: Chronic or frequent coughs; Shortness of Breath Medical History: KISHAWN, PICKAR ([id number]) Positive for: Sleep Apnea Negative for: Aspiration; Asthma; Chronic Obstructive Pulmonary Disease (COPD); Pneumothorax; Tuberculosis Cardiovascular Complaints and Symptoms: Negative for: Chest pain; LE edema Medical History: Positive for: Arrhythmia - a fib; Hypertension Negative for: Angina; Congestive Heart Failure; Coronary Artery Disease; Deep Vein Thrombosis; Hypotension; Myocardial Infarction; Peripheral Arterial Disease; Peripheral Venous Disease; Phlebitis; Vasculitis Gastrointestinal Complaints and Symptoms: Negative for: Frequent diarrhea; Nausea; Vomiting Medical History: Negative for: Cirrhosis ; Colitis; Crohnos; Hepatitis A; Hepatitis B; Hepatitis C Endocrine Complaints and Symptoms: Negative for: Hepatitis; Thyroid disease; Polydypsia (Excessive Thirst) Medical History: Positive for: Type II Diabetes Negative for: Type I Diabetes Treated with: Oral agents Blood sugar tested every day: Yes Tested : BID Genitourinary Complaints and Symptoms: Negative for: Kidney failure/ Dialysis; Incontinence/dribbling Medical History: Negative for: End Stage Renal Disease Immunological Complaints  and Symptoms: Negative for: Hives; Itching Medical History: Negative for: Lupus Erythematosus; Raynaudos; Scleroderma Integumentary (Skin) Complaints and Symptoms: Positive for: Wounds Negative for: Bleeding or bruising tendency; Breakdown; Swelling Medical History: Negative for: History of Burn; History of pressure wounds Musculoskeletal WORTHY, BOSCHERT ([id number]) Complaints and Symptoms: Negative for: Muscle Pain; Muscle Weakness Medical History: Positive for: Osteoarthritis Negative for: Gout; Rheumatoid Arthritis; Osteomyelitis Neurologic Complaints and Symptoms: Negative for: Numbness/parasthesias; Focal/Weakness Medical History: Positive for: Neuropathy Negative for: Dementia; Quadriplegia; Paraplegia; Seizure Disorder Psychiatric Complaints and Symptoms: Negative for: Anxiety; Claustrophobia Medical History: Negative for: Anorexia/bulimia; Confinement Anxiety Oncologic Medical History: Positive for: Received Radiation Negative for: Received Chemotherapy Past Medical History Notes: prostate cancer Immunizations Pneumococcal Vaccine: Received Pneumococcal Vaccination: Yes Implantable Devices Family and Social History Cancer: Yes - Siblings; Diabetes: Yes - Father,Mother,Siblings; Heart Disease: Yes - Father; Hereditary Spherocytosis: No; Hypertension: Yes - Father; Kidney Disease: No; Lung Disease: Yes - Father; Seizures: No; Stroke: No; Thyroid Problems: No; Tuberculosis: No; Current every day smoker; Marital Status - Married; Alcohol Use: Rarely; Drug Use: No History; Caffeine Use: Daily; Financial Concerns: No; Food, Clothing or Shelter Needs: No; Support System Lacking: No; Transportation Concerns: No; Advanced Directives: No; Patient does not want information on Advanced Directives Electronic Signature(s) Signed: 12/29/2018 5:29:12 PM By: Montey Hora Signed: 12/29/2018 5:53:07 PM By: Linton Ham MD Entered By: Montey Hora on 12/29/2018  09:29:26 [name] ([id number]) -------------------------------------------------------------------------------- SuperBill Details Patient Name: [name] Date of Service: 12/29/2018 Medical Record Number: [id number] Patient Account Number: [id number] Date of Birth/Sex: Dec 10, 1945 (73 y.o. 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 Facility Procedures CPT4  Code Description: 44010272 99213 - WOUND CARE VISIT-LEV 3 EST PT Modifier: Quantity: 1 CPT4 Code Description: 53664403 47425 - DEB SUBQ TISSUE 20 SQ CM/< ICD-10 Diagnosis Description 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 Modifier: to breakdown of Quantity: 1 skin Physician Procedures CPT4 Code Description: [id number] [place] PHYS LEVEL 3 o NEW PT ICD-10 Diagnosis Description E11.621 Type 2 diabetes mellitus with foot ulcer 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

## 2018-12-31 NOTE — Progress Notes (Signed)
[name], [name] ([id number]) Visit Report for 12/29/2018 Allergy List Details Patient Name: [name], [name] Date of Service: 12/29/2018 8:45 AM Medical Record Number: [id number] Patient Account Number: [id number] Date of Birth/Sex: [birth year] (72 y.o. M) Treating RN: Montey Hora Primary Care Glorimar Stroope: Ria Bush Other Clinician: Referring Kileen Lange: Ria Bush Treating Mehki Klumpp/Extender: Ricard Dillon Weeks in Treatment: 0 Allergies Active Allergies No Known Drug Allergies Allergy Notes Electronic Signature(s) Signed: 12/29/2018 5:29:12 PM By: Montey Hora Entered By: Montey Hora on 12/29/2018 09:19:45 [name] ([id number]) -------------------------------------------------------------------------------- Arrival Information Details Patient Name: [name] Date of Service: 12/29/2018 8:45 AM Medical Record Number: [id number] Patient Account Number: [id number] Date of Birth/Sex: [birth year] (72 y.o. M) Treating RN: Cornell Barman Primary Care Payge Eppes: Ria Bush Other Clinician: Referring Mahala Rommel: Ria Bush Treating Summar Mcglothlin/Extender: Tito Dine in Treatment: 0 Visit Information Patient Arrived: [name] Arrival Time: 09:12 Accompanied By: self Transfer Assistance: None Patient Identification Verified: Yes Secondary Verification Process Yes Completed: Patient Has Alerts: Yes Patient Alerts: Patient on Blood Thinner Warfarin DMII Electronic Signature(s) Signed: 12/29/2018 5:29:12 PM By: Montey Hora Entered By: Montey Hora on 12/29/2018 09:20:06 [name] ([id number]) -------------------------------------------------------------------------------- Clinic Level of Care Assessment Details Patient Name: [name] Date of Service: 12/29/2018 8:45 AM Medical Record Number: [id number] Patient Account Number: [id number] Date of Birth/Sex: [birth year] (72 y.o. M) Treating RN: Cornell Barman Primary Care Dorie Ohms:  Ria Bush Other Clinician: Referring Naithen Rivenburg: Ria Bush Treating Nadeen Shipman/Extender: Tito Dine in Treatment: 0 Clinic Level of Care Assessment Items TOOL 1 Quantity Score []  - Use when EandM and Procedure is performed on INITIAL visit 0 ASSESSMENTS - Nursing Assessment / Reassessment X - General Physical Exam (combine w/ comprehensive assessment (listed just below) when 1 20 performed on new pt. evals) X- 1 25 Comprehensive Assessment (HX, ROS, Risk Assessments, Wounds Hx, etc.) ASSESSMENTS - Wound and Skin Assessment / Reassessment []  - Dermatologic / Skin Assessment (not related to wound area) 0 ASSESSMENTS - Ostomy and/or Continence Assessment and Care []  - Incontinence Assessment and Management 0 []  - 0 Ostomy Care Assessment and Management (repouching, etc.) PROCESS - Coordination of Care X - Simple Patient / Family Education for ongoing care 1 15 []  - 0 Complex (extensive) Patient / Family Education for ongoing care X- 1 10 Staff obtains Programmer, systems, Records, Test Results / Process Orders []  - 0 Staff telephones HHA, Nursing Homes / Clarify orders / etc []  - 0 Routine Transfer to another Facility (non-emergent condition) []  - 0 Routine Hospital Admission (non-emergent condition) X- 1 15 New Admissions / Biomedical engineer / Ordering NPWT, Apligraf, etc. []  - 0 Emergency Hospital Admission (emergent condition) PROCESS - Special Needs []  - Pediatric / Minor Patient Management 0 []  - 0 Isolation Patient Management []  - 0 Hearing / Language / Visual special needs []  - 0 Assessment of Community assistance (transportation, D/C planning, etc.) []  - 0 Additional assistance / Altered mentation []  - 0 Support Surface(s) Assessment (bed, cushion, seat, etc.) [name] ([id number]) INTERVENTIONS - Miscellaneous []  - External ear exam 0 []  - 0 Patient Transfer (multiple staff / Civil Service fast streamer / Similar devices) []  - 0 Simple Staple /  Suture removal (25 or less) []  - 0 Complex Staple / Suture removal (26 or more) []  - 0 Hypo/Hyperglycemic Management (do not check if billed separately) X- 1 15 Ankle / Brachial Index (ABI) - do not check if billed separately Has the patient been seen at the hospital within the last three  years: Yes Total Score: 100 Level Of Care: New/Established - Level 3 Electronic Signature(s) Signed: 12/30/2018 5:43:44 PM By: Gretta Cool, BSN, RN, CWS, Kim RN, BSN Entered By: Gretta Cool, BSN, RN, CWS, Kim on 12/29/2018 10:19:06 [name] ([id number]) -------------------------------------------------------------------------------- Encounter Discharge Information Details Patient Name: [name] Date of Service: 12/29/2018 8:45 AM Medical Record Number: [id number] Patient Account Number: [id number] Date of Birth/Sex: [birth year] (72 y.o. M) Treating RN: Montey Hora Primary Care Yarelin Reichardt: Ria Bush Other Clinician: Referring Vitali Seibert: Ria Bush Treating Jorryn Hershberger/Extender: Tito Dine in Treatment: 0 Encounter Discharge Information Items Post Procedure Vitals Discharge Condition: Stable Temperature (F): 97.6 Ambulatory Status: Cane Pulse (bpm): 65 Discharge Destination: Home Respiratory Rate (breaths/min): 16 Transportation: Private Auto Blood Pressure (mmHg): 116/75 Accompanied By: self Schedule Follow-up Appointment: Yes Clinical Summary of Care: Electronic Signature(s) Signed: 12/29/2018 11:13:57 AM By: Montey Hora Entered By: Montey Hora on 12/29/2018 11:13:57 [name] ([id number]) -------------------------------------------------------------------------------- Lower Extremity Assessment Details Patient Name: [name] Date of Service: 12/29/2018 8:45 AM Medical Record Number: [id number] Patient Account Number: [id number] Date of Birth/Sex: [birth year] (72 y.o. M) Treating RN: Montey Hora Primary Care Anicia Leuthold: Ria Bush Other  Clinician: Referring Kurstyn Larios: Ria Bush Treating Tully Mcinturff/Extender: Tito Dine in Treatment: 0 Edema Assessment Assessed: [Left: No] [Right: No] [Left: Edema] [Right: :] Calf Left: Right: Point of Measurement: 34 cm From Medial Instep 35.7 cm 36.6 cm Ankle Left: Right: Point of Measurement: 12 cm From Medial Instep 23.5 cm 24.4 cm Vascular Assessment Pulses: Dorsalis Pedis Palpable: [Left:Yes] [Right:Yes] Doppler Audible: [Left:Yes] [Right:Yes] Posterior Tibial Palpable: [Left:Yes] [Right:Yes] Doppler Audible: [Left:Yes] [Right:Yes] Extremity colors, hair growth, and conditions: Extremity Color: [Left:Normal] [Right:Normal] Hair Growth on Extremity: [Left:Yes] [Right:Yes] Temperature of Extremity: [Left:Warm] [Right:Warm] Capillary Refill: [Left:< 3 seconds] [Right:< 3 seconds] Blood Pressure: Brachial: [Right:120] Dorsalis Pedis: 108 [Left:Dorsalis Pedis: 112] Ankle: Posterior Tibial: 122 [Left:Posterior Tibial: 128 1.02] [Right:1.07] Toe Nail Assessment Left: Right: Thick: Yes Yes Discolored: Yes Yes Deformed: No No Improper Length and Hygiene: No No Electronic Signature(s) Signed: 12/29/2018 5:29:12 PM By: Montey Hora Entered By: Montey Hora on 12/29/2018 09:45:57 [name] ([name] ([id number]) -------------------------------------------------------------------------------- Multi Wound Chart Details Patient Name: [name] Date of Service: 12/29/2018 8:45 AM Medical Record Number: [id number] Patient Account Number: [id number] Date of Birth/Sex: [birth year] (73 y.o. M) Treating RN: Cornell Barman Primary Care Ming Mcmannis: Ria Bush Other Clinician: Referring Acxel Dingee: Ria Bush Treating Gemayel Mascio/Extender: Tito Dine in Treatment: 0 Vital Signs Height(in): 72 Pulse(bpm): 5 Weight(lbs): 224 Blood Pressure(mmHg): 116/75 Body Mass Index(BMI): 30 Temperature(F): 97.6 Respiratory  Rate 16 (breaths/min): Photos: [N/A:N/A] Wound Location: Right Toe Great - Plantar N/A N/A Wounding Event: Blister N/A N/A Primary Etiology: Diabetic Wound/Ulcer of the N/A N/A Lower Extremity Comorbid History: Sleep Apnea, Arrhythmia, N/A N/A Hypertension, Type II Diabetes, Osteoarthritis, Neuropathy, Received Radiation Date Acquired: 12/11/2018 N/A N/A Weeks of Treatment: 0 N/A N/A Wound Status: Open N/A N/A Measurements L x W x D 2x1x0.1 N/A N/A (cm) Area (cm) : 1.571 N/A N/A Volume (cm) : 0.157 N/A N/A Classification: Grade 2 N/A N/A Exudate Amount: Medium N/A N/A Exudate Type: Serous N/A N/A Exudate Color: amber N/A N/A Wound Margin: Flat and Intact N/A N/A Granulation Amount: Medium (34-66%) N/A N/A Granulation Quality: Pink N/A N/A Necrotic Amount: Medium (34-66%) N/A N/A Exposed Structures: Fat Layer (Subcutaneous N/A N/A Tissue) Exposed: Yes Fascia: No Tendon: No Muscle: No [name], [name] ([id number]) Joint: No Bone: No Epithelialization: None N/A N/A Debridement: Debridement -  Excisional N/A N/A Pre-procedure 10:10 N/A N/A Verification/Time Out Taken: Pain Control: Lidocaine N/A N/A Tissue Debrided: Subcutaneous, Slough N/A N/A Level: Skin/Subcutaneous Tissue N/A N/A Debridement Area (sq cm): 2 N/A N/A Instrument: Curette N/A N/A Bleeding: Minimum N/A N/A Hemostasis Achieved: Pressure N/A N/A Procedural Pain: 0 N/A N/A Post Procedural Pain: 0 N/A N/A Debridement Treatment Procedure was tolerated well N/A N/A Response: Post Debridement 2x1x0.1 N/A N/A Measurements L x W x D (cm) Post Debridement Volume: 0.157 N/A N/A (cm) Periwound Skin Texture: Callus: Yes N/A N/A Excoriation: No Induration: No Crepitus: No Rash: No Scarring: No Periwound Skin Moisture: Maceration: No N/A N/A Dry/Scaly: No Periwound Skin Color: Atrophie Blanche: No N/A N/A Cyanosis: No Ecchymosis: No Erythema: No Hemosiderin Staining: No Mottled: No Pallor:  No Rubor: No Temperature: No Abnormality N/A N/A Tenderness on Palpation: Yes N/A N/A Wound Preparation: Ulcer Cleansing: N/A N/A Rinsed/Irrigated with Saline Topical Anesthetic Applied: Other: lidocaine 4% Procedures Performed: Debridement N/A N/A Treatment Notes Wound #1 (Right, Plantar Toe Great) Notes prisma, felt, gauze and coverlet Electronic Signature(s) Signed: 12/29/2018 5:53:07 PM By: Linton Ham MD [name] ([id number]) Entered By: Linton Ham on 12/29/2018 12:25:19 [name] ([id number]) -------------------------------------------------------------------------------- [place] Details Patient Name: [name] Date of Service: 12/29/2018 8:45 AM Medical Record Number: [id number] Patient Account Number: [id number] Date of Birth/Sex: 04-Jun-1946 (72 y.o. M) Treating RN: Cornell Barman Primary Care Demichael Traum: Ria Bush Other Clinician: Referring Elonzo Sopp: Ria Bush Treating Levern Pitter/Extender: Tito Dine in Treatment: 0 Active Inactive Orientation to the Wound Care Program Nursing Diagnoses: Knowledge deficit related to the wound healing center program Goals: Patient/caregiver will verbalize understanding of the [place] Program Date Initiated: 12/29/2018 Target Resolution Date: 01/05/2019 Goal Status: Active Interventions: Provide education on orientation to the wound center Notes: Peripheral Neuropathy Nursing Diagnoses: Knowledge deficit related to disease process and management of peripheral neurovascular dysfunction Goals: Patient/caregiver will verbalize understanding of disease process and disease management Date Initiated: 12/29/2018 Target Resolution Date: 01/05/2019 Goal Status: Active Interventions: Assess signs and symptoms of neuropathy upon admission and as needed Notes: Pressure Nursing Diagnoses: Knowledge deficit related to management of pressures ulcers Potential for  impaired tissue integrity related to pressure, friction, moisture, and shear Goals: Patient/caregiver will verbalize understanding of pressure ulcer management Date Initiated: 12/29/2018 Target Resolution Date: 01/05/2019 Goal Status: Active Interventions: [name], [name] ([id number]) Assess: immobility, friction, shearing, incontinence upon admission and as needed Assess offloading mechanisms upon admission and as needed Notes: Wound/Skin Impairment Nursing Diagnoses: Impaired tissue integrity Goals: Ulcer/skin breakdown will have a volume reduction of 30% by week 4 Date Initiated: 12/29/2018 Target Resolution Date: 01/29/2019 Goal Status: Active Interventions: Provide education on ulcer and skin care Treatment Activities: Referred to DME Katelee Schupp for dressing supplies : 12/29/2018 Notes: Electronic Signature(s) Signed: 12/30/2018 5:43:44 PM By: Gretta Cool, BSN, RN, CWS, Kim RN, BSN Entered By: Gretta Cool, BSN, RN, CWS, Kim on 12/29/2018 10:13:26 [name] ([id number]) -------------------------------------------------------------------------------- Pain Assessment Details Patient Name: [name] Date of Service: 12/29/2018 8:45 AM Medical Record Number: [id number] Patient Account Number: [id number] Date of Birth/Sex: [birth year] (72 y.o. M) Treating RN: Cornell Barman Primary Care Jassiah Viviano: Ria Bush Other Clinician: Referring Cleotha Tsang: Ria Bush Treating Alayzha An/Extender: Tito Dine in Treatment: 0 Active Problems Location of Pain Severity and Description of Pain Patient Has Paino No Site Locations Pain Management and Medication Current Pain Management: Electronic Signature(s) Signed: 12/29/2018 11:55:15 AM By: Paulla Fore, RRT, CHT Signed: 12/30/2018 5:43:44 PM By:  Gretta Cool, BSN, RN, CWS, Kim RN, BSN Entered By: Lorine Bears on 12/29/2018 09:14:13 [name]  ([id number]) -------------------------------------------------------------------------------- Patient/Caregiver Education Details Patient Name: [name] Date of Service: 12/29/2018 8:45 AM Medical Record Number: [id number] Patient Account Number: [id number] Date of Birth/Gender: [birth year] (72 y.o. M) Treating RN: Cornell Barman Primary Care Physician: Ria Bush Other Clinician: Referring Physician: Ria Bush Treating Physician/Extender: Tito Dine in Treatment: 0 Education Assessment Education Provided To: Patient Education Topics Provided Peripheral Neuropathy: Handouts: Diabetes, Neuropathy Methods: Demonstration, Explain/Verbal Responses: State content correctly Pressure: Handouts: Pressure Ulcers: Care and Offloading Methods: Demonstration, Explain/Verbal Responses: State content correctly Wound/Skin Impairment: Handouts: Caring for Your Ulcer Methods: Demonstration, Explain/Verbal Responses: State content correctly Electronic Signature(s) Signed: 12/30/2018 5:43:44 PM By: Gretta Cool, BSN, RN, CWS, Kim RN, BSN Entered By: Gretta Cool, BSN, RN, CWS, Kim on 12/29/2018 10:19:38 [name] ([id number]) -------------------------------------------------------------------------------- Wound Assessment Details Patient Name: [name] Date of Service: 12/29/2018 8:45 AM Medical Record Number: [id number] Patient Account Number: [id number] Date of Birth/Sex: [birth year] (72 y.o. M) Treating RN: Montey Hora Primary Care Ocie Tino: Ria Bush Other Clinician: Referring Shuan Statzer: Ria Bush Treating Zhoe Catania/Extender: Tito Dine in Treatment: 0 Wound Status Wound Number: 1 Primary Diabetic Wound/Ulcer of the Lower Extremity Etiology: Wound Location: Right Toe Great - Plantar Wound Open Wounding Event: Blister Status: Date Acquired: 12/11/2018 Comorbid Sleep Apnea, Arrhythmia, Hypertension, Type II Weeks Of Treatment:  0 History: Diabetes, Osteoarthritis, Neuropathy, Received Clustered Wound: No Radiation Photos Photo Uploaded By: Montey Hora on 12/29/2018 10:20:12 Wound Measurements Length: (cm) 2 Width: (cm) 1 Depth: (cm) 0.1 Area: (cm) 1.571 Volume: (cm) 0.157 % Reduction in Area: % Reduction in Volume: Epithelialization: None Tunneling: No Undermining: No Wound Description Classification: Grade 2 Wound Margin: Flat and Intact Exudate Amount: Medium Exudate Type: Serous Exudate Color: amber Foul Odor After Cleansing: No Slough/Fibrino Yes Wound Bed Granulation Amount: Medium (34-66%) Exposed Structure Granulation Quality: Pink Fascia Exposed: No Necrotic Amount: Medium (34-66%) Fat Layer (Subcutaneous Tissue) Exposed: Yes Necrotic Quality: Adherent Slough Tendon Exposed: No Muscle Exposed: No Joint Exposed: No Bone Exposed: No Periwound Skin Texture [name], Jim REEDE. ([id number]) Texture Color No Abnormalities Noted: No No Abnormalities Noted: No Callus: Yes Atrophie Blanche: No Crepitus: No Cyanosis: No Excoriation: No Ecchymosis: No Induration: No Erythema: No Rash: No Hemosiderin Staining: No Scarring: No Mottled: No Pallor: No Moisture Rubor: No No Abnormalities Noted: No Dry / Scaly: No Temperature / Pain Maceration: No Temperature: No Abnormality Tenderness on Palpation: Yes Wound Preparation Ulcer Cleansing: Rinsed/Irrigated with Saline Topical Anesthetic Applied: Other: lidocaine 4%, Treatment Notes Wound #1 (Right, Plantar Toe Great) Notes prisma, felt, gauze and coverlet Electronic Signature(s) Signed: 12/29/2018 5:29:12 PM By: Montey Hora Entered By: Montey Hora on 12/29/2018 09:32:24 [name] ([id number]) -------------------------------------------------------------------------------- Vitals Details Patient Name: [name] Date of Service: 12/29/2018 8:45 AM Medical Record Number: [id number] Patient Account Number:  [id number] Date of Birth/Sex: September 25, 1946 (73 y.o. M) Treating RN: Cornell Barman Primary Care Shamarcus Hoheisel: Ria Bush Other Clinician: Referring Cyndee Giammarco: Ria Bush Treating Daymion Nazaire/Extender: Tito Dine in Treatment: 0 Vital Signs Time Taken: 09:14 Temperature (F): 97.6 Height (in): 72 Pulse (bpm): 65 Source: Stated Respiratory Rate (breaths/min): 16 Weight (lbs): 224 Blood Pressure (mmHg): 116/75 Source: Measured Reference Range: 80 - 120 mg / dl Body Mass Index (BMI): 30.4 Electronic Signature(s) Signed: 12/29/2018 11:55:15 AM By: Lorine Bears RCP, RRT, CHT Entered By: Lorine Bears on 12/29/2018 09:17:06

## 2019-01-02 ENCOUNTER — Other Ambulatory Visit: Payer: Self-pay | Admitting: Family Medicine

## 2019-01-03 ENCOUNTER — Ambulatory Visit (INDEPENDENT_AMBULATORY_CARE_PROVIDER_SITE_OTHER): Payer: Medicare Other

## 2019-01-03 ENCOUNTER — Other Ambulatory Visit: Payer: Self-pay

## 2019-01-03 ENCOUNTER — Other Ambulatory Visit: Payer: Self-pay | Admitting: Cardiovascular Disease

## 2019-01-03 ENCOUNTER — Ambulatory Visit
Admission: RE | Admit: 2019-01-03 | Discharge: 2019-01-03 | Disposition: A | Discharge status: Home / Self Care | Payer: Medicare Other | Source: Ambulatory Visit | Attending: Radiation Oncology | Admitting: Radiation Oncology

## 2019-01-03 DIAGNOSIS — C61 Malignant neoplasm of prostate: Secondary | ICD-10-CM | POA: Diagnosis not present

## 2019-01-03 DIAGNOSIS — I4891 Unspecified atrial fibrillation: Secondary | ICD-10-CM | POA: Diagnosis not present

## 2019-01-03 DIAGNOSIS — Z510 Encounter for antineoplastic radiation therapy: Secondary | ICD-10-CM | POA: Diagnosis not present

## 2019-01-03 DIAGNOSIS — Z5181 Encounter for therapeutic drug level monitoring: Secondary | ICD-10-CM

## 2019-01-03 LAB — POCT INR: INR: 2.8 (ref 2.0–3.0)

## 2019-01-03 MED ORDER — WARFARIN SODIUM 5 MG PO TABS: ORAL_TABLET | ORAL | 0 refills | Status: DC

## 2019-01-03 NOTE — Patient Instructions (Signed)
Please continue dosage of 1 tablet every day except 1/2 tablet on Mondays, Wednesdays and Fridays.  Recheck INR in 4 weeks.

## 2019-01-04 ENCOUNTER — Telehealth: Payer: Self-pay | Admitting: Family Medicine

## 2019-01-04 ENCOUNTER — Ambulatory Visit
Admission: RE | Admit: 2019-01-04 | Discharge: 2019-01-04 | Disposition: A | Discharge status: Home / Self Care | Payer: Medicare Other | Source: Ambulatory Visit | Attending: Radiation Oncology | Admitting: Radiation Oncology

## 2019-01-04 DIAGNOSIS — C61 Malignant neoplasm of prostate: Secondary | ICD-10-CM | POA: Diagnosis not present

## 2019-01-04 DIAGNOSIS — Z510 Encounter for antineoplastic radiation therapy: Secondary | ICD-10-CM | POA: Diagnosis not present

## 2019-01-04 NOTE — Telephone Encounter (Signed)
E-scribed refill.  Please schedule for annual wellness.

## 2019-01-05 ENCOUNTER — Ambulatory Visit
Admission: RE | Admit: 2019-01-05 | Discharge: 2019-01-05 | Disposition: A | Discharge status: Home / Self Care | Payer: Medicare Other | Source: Ambulatory Visit | Attending: Radiation Oncology | Admitting: Radiation Oncology

## 2019-01-05 ENCOUNTER — Encounter: Payer: Medicare Other | Admitting: Internal Medicine

## 2019-01-05 DIAGNOSIS — C61 Malignant neoplasm of prostate: Secondary | ICD-10-CM | POA: Diagnosis not present

## 2019-01-05 DIAGNOSIS — Z510 Encounter for antineoplastic radiation therapy: Secondary | ICD-10-CM | POA: Diagnosis not present

## 2019-01-05 DIAGNOSIS — E1142 Type 2 diabetes mellitus with diabetic polyneuropathy: Secondary | ICD-10-CM | POA: Diagnosis not present

## 2019-01-05 DIAGNOSIS — I10 Essential (primary) hypertension: Secondary | ICD-10-CM | POA: Diagnosis not present

## 2019-01-05 DIAGNOSIS — E1140 Type 2 diabetes mellitus with diabetic neuropathy, unspecified: Secondary | ICD-10-CM | POA: Diagnosis not present

## 2019-01-05 DIAGNOSIS — E11621 Type 2 diabetes mellitus with foot ulcer: Secondary | ICD-10-CM | POA: Diagnosis not present

## 2019-01-05 DIAGNOSIS — I4891 Unspecified atrial fibrillation: Secondary | ICD-10-CM | POA: Diagnosis not present

## 2019-01-05 DIAGNOSIS — L97512 Non-pressure chronic ulcer of other part of right foot with fat layer exposed: Secondary | ICD-10-CM | POA: Diagnosis not present

## 2019-01-05 DIAGNOSIS — E538 Deficiency of other specified B group vitamins: Secondary | ICD-10-CM | POA: Diagnosis not present

## 2019-01-05 DIAGNOSIS — E11622 Type 2 diabetes mellitus with other skin ulcer: Secondary | ICD-10-CM | POA: Diagnosis not present

## 2019-01-05 DIAGNOSIS — L97511 Non-pressure chronic ulcer of other part of right foot limited to breakdown of skin: Secondary | ICD-10-CM | POA: Diagnosis not present

## 2019-01-06 ENCOUNTER — Other Ambulatory Visit: Payer: Self-pay | Admitting: Family Medicine

## 2019-01-06 ENCOUNTER — Ambulatory Visit
Admission: RE | Admit: 2019-01-06 | Discharge: 2019-01-06 | Disposition: A | Discharge status: Home / Self Care | Payer: Medicare Other | Source: Ambulatory Visit | Attending: Radiation Oncology | Admitting: Radiation Oncology

## 2019-01-06 DIAGNOSIS — Z510 Encounter for antineoplastic radiation therapy: Secondary | ICD-10-CM | POA: Diagnosis not present

## 2019-01-06 DIAGNOSIS — C61 Malignant neoplasm of prostate: Secondary | ICD-10-CM | POA: Diagnosis not present

## 2019-01-06 NOTE — Telephone Encounter (Signed)
Pt was seen at wound clinic last week.

## 2019-01-06 NOTE — Telephone Encounter (Signed)
Reviewed wound clinic note - seems to be improving, no further abx needed unless he sees surrounding redness or new pus drainage. Keep f/u with wound clinic.

## 2019-01-06 NOTE — Telephone Encounter (Signed)
Left message on vm per dpr relaying Dr. G's message.  

## 2019-01-07 ENCOUNTER — Ambulatory Visit: Payer: Medicare Other

## 2019-01-07 NOTE — Progress Notes (Signed)
DIXIE, JAFRI ([id number]) Visit Report for 01/05/2019 HPI Details Patient Name: [name], [name] Date of Service: 01/05/2019 9:15 AM Medical Record Number: [id number] Patient Account Number: [id number] Date of Birth/Sex: 05/14/1946 (72 y.o. M) Treating RN: Cornell Barman Primary Care Provider: Ria Bush Other Clinician: Referring Provider: Ria Bush Treating Provider/Extender: Tito Dine in Treatment: 1 History of Present Illness HPI Description: ADMISSION 12/29/2018 This is a 73 year old man with type 2 diabetes and a history of neuropathy. 3 weeks ago he noted an open area on the plantar aspect of his right great toe or more precisely his wife noticed it. They think it might of come from a problem with the sole of the shoe he was using. In any case this had considerable size that he was seen on 12/23/2018 by his primary doctor. At that point it was stated that he had been using peroxide and topical alcohol. He was given an antibiotic ointment and put on oral Augmentin. Since then the wound has contracted according to the patient. He does have diabetic neuropathy but does not have significant arterial disease. He had an evaluation in 2017 at [facility]. This included an ABI in the right of 1.13 on the left at 1.10 there was not felt to be any evidence of obstruction. I do not see TBI's or waveforms however the patient was not felt to have hemodynamically significant obstruction. ABIs in our clinic were 1.07 on the right and 1.02 on the left Past medical history includes A. fib on Coumadin, prostate cancer, thoracic and abdominal aortic aneurysm, vitamin B12 deficiency, iron deficiency and type 2 diabetes 1/29, plantar aspect of the right great toe in a diabetic with insensate neuropathy. We used silver collagen on this last week with debridement. He states this bled through on a couple of occasions and did not want any additional debridement although his wound appears as  though it does not require it. Electronic Signature(s) Signed: 01/06/2019 9:49:40 AM By: Linton Ham MD Entered By: Linton Ham on 01/05/2019 11:25:02 [name] ([id number]) -------------------------------------------------------------------------------- Physical Exam Details Patient Name: [name] Date of Service: 01/05/2019 9:15 AM Medical Record Number: [id number] Patient Account Number: [id number] Date of Birth/Sex: [birth year] (72 y.o. M) Treating RN: Cornell Barman Primary Care Provider: Ria Bush Other Clinician: Referring Provider: Ria Bush Treating Provider/Extender: Tito Dine in Treatment: 1 Constitutional Sitting or standing Blood Pressure is within target range for patient.. Pulse regular and within target range for patient.Marland Kitchen Respirations regular, non-labored and within target range.. Temperature is normal and within the target range for the patient.Marland Kitchen appears in no distress. Respiratory Respiratory effort is easy and symmetric bilaterally. Rate is normal at rest and on room air.. Cardiovascular Pedal pulses palpable and strong bilaterally.. Lymphatic None palpable in the popliteal area. Integumentary (Hair, Skin) Skin and subcutaneous tissue without rashes, lesions, discoloration or ulcers. No evidence of fungal disease. Hair and skin color texture normal and healthy in appearance.. Neurological Diabetic insensate neuropathy. Psychiatric No evidence of depression, anxiety, or agitation. Calm, cooperative, and communicative. Appropriate interactions and affect.. Notes Wound exam; this is on the plantar tip of the right great toe. Oval-shaped wound with a generally healthy looking surface. He did not want debridement although no debridement is required. The surface looks healthy beginning of epithelialization. No evidence of surrounding infection Electronic Signature(s) Signed: 01/06/2019 9:49:40 AM By: Linton Ham  MD Entered By: Linton Ham on 01/05/2019 11:26:37 [name] ([id number]) -------------------------------------------------------------------------------- Physician Orders Details Patient Name: Jim Nakayama  W. Date of Service: 01/05/2019 9:15 AM Medical Record Number: [id number] Patient Account Number: [id number] Date of Birth/Sex: 09-17-1946 (73 y.o. M) Treating RN: Cornell Barman Primary Care Provider: Ria Bush Other Clinician: Referring Provider: Ria Bush Treating Provider/Extender: Tito Dine in Treatment: 1 Verbal / Phone Orders: No Diagnosis Coding Wound Cleansing Wound #1 Right,Plantar Toe Great o Cleanse wound with mild soap and water Anesthetic (add to Medication List) Wound #1 Right,Plantar Toe Great o Topical Lidocaine 4% cream applied to wound bed prior to debridement (In Clinic Only). Primary Wound Dressing Wound #1 Right,Plantar Toe Great o Silver Collagen Secondary Dressing Wound #1 Right,Plantar Toe Great o Other - Offloading felt, gauze, conform Dressing Change Frequency Wound #1 Right,Plantar Toe Great o Change dressing every day. Follow-up Appointments Wound #1 Right,Plantar Toe Great o Return Appointment in 1 week. Off-Loading Wound #1 Right,Plantar Toe Great o Open toe surgical shoe with peg assist. o Other: - Keep Pressure off of foot. Electronic Signature(s) Signed: 01/05/2019 6:00:27 PM By: Gretta Cool, BSN, RN, CWS, Kim RN, BSN Signed: 01/06/2019 9:49:40 AM By: Linton Ham MD Entered By: Gretta Cool, BSN, RN, CWS, Kim on 01/05/2019 10:31:51 KARIN, GRIFFITH ([id number]) -------------------------------------------------------------------------------- Problem List Details Patient Name: [name] Date of Service: 01/05/2019 9:15 AM Medical Record Number: [id number] Patient Account Number: [id number] Date of Birth/Sex: Oct 25, 1946 (72 y.o. M) Treating RN: Cornell Barman Primary Care Provider: Ria Bush  Other Clinician: Referring Provider: Ria Bush Treating Provider/Extender: Tito Dine in Treatment: 1 Active Problems ICD-10 Evaluated Encounter Code Description Active Date Today Diagnosis E11.621 Type 2 diabetes mellitus with foot ulcer 12/29/2018 No Yes 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 to 12/29/2018 No Yes breakdown of skin E11.42 Type 2 diabetes mellitus with diabetic polyneuropathy 12/29/2018 No Yes Inactive Problems Resolved Problems Electronic Signature(s) Signed: 01/06/2019 9:49:40 AM By: Linton Ham MD Entered By: Linton Ham on 01/05/2019 11:23:26 [name] ([id number]) -------------------------------------------------------------------------------- Progress Note Details Patient Name: [name] Date of Service: 01/05/2019 9:15 AM Medical Record Number: [id number] Patient Account Number: [id number] Date of Birth/Sex: [birth year] (72 y.o. M) Treating RN: Cornell Barman Primary Care Provider: Ria Bush Other Clinician: Referring Provider: Ria Bush Treating Provider/Extender: Tito Dine in Treatment: 1 Subjective History of Present Illness (HPI) ADMISSION 12/29/2018 This is a 73 year old man with type 2 diabetes and a history of neuropathy. 3 weeks ago he noted an open area on the plantar aspect of his right great toe or more precisely his wife noticed it. They think it might of come from a problem with the sole of the shoe he was using. In any case this had considerable size that he was seen on 12/23/2018 by his primary doctor. At that point it was stated that he had been using peroxide and topical alcohol. He was given an antibiotic ointment and put on oral Augmentin. Since then the wound has contracted according to the patient. He does have diabetic neuropathy but does not have significant arterial disease. He had an evaluation in 2017 at [facility]. This included an ABI in the  right of 1.13 on the left at 1.10 there was not felt to be any evidence of obstruction. I do not see TBI's or waveforms however the patient was not felt to have hemodynamically significant obstruction. ABIs in our clinic were 1.07 on the right and 1.02 on the left Past medical history includes A. fib on Coumadin, prostate cancer, thoracic and abdominal aortic aneurysm, vitamin B12 deficiency, iron  deficiency and type 2 diabetes 1/29, plantar aspect of the right great toe in a diabetic with insensate neuropathy. We used silver collagen on this last week with debridement. He states this bled through on a couple of occasions and did not want any additional debridement although his wound appears as though it does not require it. Objective Constitutional Sitting or standing Blood Pressure is within target range for patient.. Pulse regular and within target range for patient.Marland Kitchen Respirations regular, non-labored and within target range.. Temperature is normal and within the target range for the patient.Marland Kitchen appears in no distress. Vitals Time Taken: 9:21 AM, Height: 72 in, Weight: 224 lbs, BMI: 30.4, Temperature: 97.6 F, Pulse: 51 bpm, Respiratory Rate: 16 breaths/min, Blood Pressure: 111/54 mmHg. Respiratory Respiratory effort is easy and symmetric bilaterally. Rate is normal at rest and on room air.. Cardiovascular Pedal pulses palpable and strong bilaterally.Marland Kitchen [name] ([id number]) Lymphatic None palpable in the popliteal area. Neurological Diabetic insensate neuropathy. Psychiatric No evidence of depression, anxiety, or agitation. Calm, cooperative, and communicative. Appropriate interactions and affect.. General Notes: Wound exam; this is on the plantar tip of the right great toe. Oval-shaped wound with a generally healthy looking surface. He did not want debridement although no debridement is required. The surface looks healthy beginning of epithelialization. No evidence of surrounding  infection Integumentary (Hair, Skin) Skin and subcutaneous tissue without rashes, lesions, discoloration or ulcers. No evidence of fungal disease. Hair and skin color texture normal and healthy in appearance.. Wound #1 status is Open. Original cause of wound was Blister. The wound is located on the [employer]. The wound measures 2cm length x 1cm width x 0.1cm depth; 1.571cm^2 area and 0.157cm^3 volume. There is Fat Layer (Subcutaneous Tissue) Exposed exposed. There is no tunneling or undermining noted. There is a medium amount of serous drainage noted. The wound margin is flat and intact. There is medium (34-66%) pink granulation within the wound bed. There is a medium (34-66%) amount of necrotic tissue within the wound bed including Adherent Slough. The periwound skin appearance exhibited: Callus, Maceration. The periwound skin appearance did not exhibit: Crepitus, Excoriation, Induration, Rash, Scarring, Dry/Scaly, Atrophie Blanche, Cyanosis, Ecchymosis, Hemosiderin Staining, Mottled, Pallor, Rubor, Erythema. Periwound temperature was noted as No Abnormality. The periwound has tenderness on palpation. 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 Primary Wound Dressing: Wound #1 Right,Plantar Toe Great: Silver Collagen Secondary Dressing: Wound #1 Right,Plantar Toe Great: Other - Offloading felt, gauze, conform [name] ([id number]) Dressing Change Frequency: Wound #1 Right,Plantar Toe Great: Change dressing every day. Follow-up Appointments: Wound  #1 Right,Plantar Toe Great: Return Appointment in 1 week. Off-Loading: Wound #1 Right,Plantar Toe Great: Open toe surgical shoe with peg assist. Other: - Keep Pressure off of foot. 1. Silver collagen to continue 2. We did not have an offloading shoe to give him last weekly provided that this week 3. Felt offloading 4. I am hopeful that this is in a situation where it will epithelialize, did not require debridement Electronic Signature(s) Signed: 01/06/2019 9:49:40 AM By: Linton Ham MD Entered By: Linton Ham on 01/05/2019 11:27:18 [name] ([id number]) --------------------------------------------------------------------------------  SuperBill Details Patient Name: [name], [name] Date of Service: 01/05/2019 Medical Record Number: [id number] Patient Account Number: [id number] Date of Birth/Sex: 1946-01-30 (72 y.o. M) Treating RN: Cornell Barman Primary Care Provider: Ria Bush Other Clinician: Referring Provider: Ria Bush Treating Provider/Extender: Tito Dine in Treatment: 1 Diagnosis Coding ICD-10 Codes Code Description E11.621 Type 2 diabetes mellitus with foot ulcer 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 to breakdown of skin E11.42 Type 2 diabetes mellitus with diabetic polyneuropathy Facility Procedures CPT4 Code: [id number] Description: [phone] - WOUND CARE VISIT-LEV 2 EST PT Modifier: Quantity: 1 Physician Procedures CPT4 Code Description: [id number] - WC PHYS LEVEL 3 - EST PT ICD-10 Diagnosis Description E11.621 Type 2 diabetes mellitus with foot ulcer 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 E11.42 Type 2 diabetes mellitus with  diabetic polyneuropathy Modifier: to breakdown of Quantity: 1 skin Electronic Signature(s) Signed: 01/06/2019 9:49:40 AM By: Linton Ham MD Entered By: Linton Ham on 01/05/2019 11:27:41

## 2019-01-08 ENCOUNTER — Ambulatory Visit: Payer: Medicare Other

## 2019-01-10 ENCOUNTER — Ambulatory Visit: Payer: Medicare Other

## 2019-01-11 ENCOUNTER — Ambulatory Visit: Payer: Medicare Other

## 2019-01-11 NOTE — Telephone Encounter (Signed)
Left message asking pt to call office  °

## 2019-01-12 ENCOUNTER — Ambulatory Visit: Payer: Medicare Other

## 2019-01-13 ENCOUNTER — Ambulatory Visit: Payer: Medicare Other

## 2019-01-13 ENCOUNTER — Inpatient Hospital Stay: Payer: Medicare Other

## 2019-01-14 ENCOUNTER — Ambulatory Visit: Payer: Medicare Other

## 2019-01-17 ENCOUNTER — Ambulatory Visit
Admission: RE | Admit: 2019-01-17 | Discharge: 2019-01-17 | Disposition: A | Discharge status: Home / Self Care | Payer: Medicare Other | Source: Ambulatory Visit | Attending: Radiation Oncology | Admitting: Radiation Oncology

## 2019-01-17 DIAGNOSIS — C61 Malignant neoplasm of prostate: Secondary | ICD-10-CM | POA: Insufficient documentation

## 2019-01-17 DIAGNOSIS — Z510 Encounter for antineoplastic radiation therapy: Secondary | ICD-10-CM | POA: Diagnosis not present

## 2019-01-18 ENCOUNTER — Ambulatory Visit
Admission: RE | Admit: 2019-01-18 | Discharge: 2019-01-18 | Disposition: A | Discharge status: Home / Self Care | Payer: Medicare Other | Source: Ambulatory Visit | Attending: Radiation Oncology | Admitting: Radiation Oncology

## 2019-01-18 DIAGNOSIS — Z510 Encounter for antineoplastic radiation therapy: Secondary | ICD-10-CM | POA: Diagnosis not present

## 2019-01-18 DIAGNOSIS — C61 Malignant neoplasm of prostate: Secondary | ICD-10-CM | POA: Diagnosis not present

## 2019-01-19 ENCOUNTER — Encounter: Payer: Medicare Other | Attending: Internal Medicine | Admitting: Internal Medicine

## 2019-01-19 ENCOUNTER — Ambulatory Visit
Admission: RE | Admit: 2019-01-19 | Discharge: 2019-01-19 | Disposition: A | Discharge status: Home / Self Care | Payer: Medicare Other | Source: Ambulatory Visit | Attending: Radiation Oncology | Admitting: Radiation Oncology

## 2019-01-19 DIAGNOSIS — M1990 Unspecified osteoarthritis, unspecified site: Secondary | ICD-10-CM | POA: Diagnosis not present

## 2019-01-19 DIAGNOSIS — E1142 Type 2 diabetes mellitus with diabetic polyneuropathy: Secondary | ICD-10-CM | POA: Insufficient documentation

## 2019-01-19 DIAGNOSIS — I714 Abdominal aortic aneurysm, without rupture: Secondary | ICD-10-CM | POA: Insufficient documentation

## 2019-01-19 DIAGNOSIS — Z7901 Long term (current) use of anticoagulants: Secondary | ICD-10-CM | POA: Diagnosis not present

## 2019-01-19 DIAGNOSIS — Z510 Encounter for antineoplastic radiation therapy: Secondary | ICD-10-CM | POA: Diagnosis not present

## 2019-01-19 DIAGNOSIS — I4891 Unspecified atrial fibrillation: Secondary | ICD-10-CM | POA: Insufficient documentation

## 2019-01-19 DIAGNOSIS — C61 Malignant neoplasm of prostate: Secondary | ICD-10-CM | POA: Diagnosis not present

## 2019-01-19 DIAGNOSIS — E11621 Type 2 diabetes mellitus with foot ulcer: Secondary | ICD-10-CM | POA: Insufficient documentation

## 2019-01-19 DIAGNOSIS — L97511 Non-pressure chronic ulcer of other part of right foot limited to breakdown of skin: Secondary | ICD-10-CM | POA: Diagnosis not present

## 2019-01-19 DIAGNOSIS — L97512 Non-pressure chronic ulcer of other part of right foot with fat layer exposed: Secondary | ICD-10-CM | POA: Diagnosis not present

## 2019-01-19 DIAGNOSIS — Z8546 Personal history of malignant neoplasm of prostate: Secondary | ICD-10-CM | POA: Insufficient documentation

## 2019-01-19 DIAGNOSIS — E1140 Type 2 diabetes mellitus with diabetic neuropathy, unspecified: Secondary | ICD-10-CM | POA: Diagnosis not present

## 2019-01-20 ENCOUNTER — Ambulatory Visit
Admission: RE | Admit: 2019-01-20 | Discharge: 2019-01-20 | Disposition: A | Discharge status: Home / Self Care | Payer: Medicare Other | Source: Ambulatory Visit | Attending: Radiation Oncology | Admitting: Radiation Oncology

## 2019-01-20 DIAGNOSIS — Z510 Encounter for antineoplastic radiation therapy: Secondary | ICD-10-CM | POA: Diagnosis not present

## 2019-01-20 DIAGNOSIS — C61 Malignant neoplasm of prostate: Secondary | ICD-10-CM | POA: Diagnosis not present

## 2019-01-21 ENCOUNTER — Ambulatory Visit
Admission: RE | Admit: 2019-01-21 | Discharge: 2019-01-21 | Disposition: A | Discharge status: Home / Self Care | Payer: Medicare Other | Source: Ambulatory Visit | Attending: Radiation Oncology | Admitting: Radiation Oncology

## 2019-01-21 DIAGNOSIS — C61 Malignant neoplasm of prostate: Secondary | ICD-10-CM | POA: Diagnosis not present

## 2019-01-21 DIAGNOSIS — Z510 Encounter for antineoplastic radiation therapy: Secondary | ICD-10-CM | POA: Diagnosis not present

## 2019-01-21 NOTE — Progress Notes (Signed)
[name], [name] ([id number]) Visit Report for 01/19/2019 HPI Details Patient Name: [name], [name] Date of Service: 01/19/2019 9:00 AM Medical Record Number: [id number] Patient Account Number: [id number] Date of Birth/Sex: 03-Jul-1946 (73 y.o. M) Treating RN: Cornell Barman Primary Care Provider: Ria Bush Other Clinician: Referring Provider: Ria Bush Treating Provider/Extender: Tito Dine in Treatment: 3 History of Present Illness HPI Description: ADMISSION 12/29/2018 This is a 73 year old man with type 2 diabetes and a history of neuropathy. 3 weeks ago he noted an open area on the plantar aspect of his right great toe or more precisely his wife noticed it. They think it might of come from a problem with the sole of the shoe he was using. In any case this had considerable size that he was seen on 12/23/2018 by his primary doctor. At that point it was stated that he had been using peroxide and topical alcohol. He was given an antibiotic ointment and put on oral Augmentin. Since then the wound has contracted according to the patient. He does have diabetic neuropathy but does not have significant arterial disease. He had an evaluation in 2017 at [facility]. This included an ABI in the right of 1.13 on the left at 1.10 there was not felt to be any evidence of obstruction. I do not see TBI's or waveforms however the patient was not felt to have hemodynamically significant obstruction. ABIs in our clinic were 1.07 on the right and 1.02 on the left Past medical history includes A. fib on Coumadin, prostate cancer, thoracic and abdominal aortic aneurysm, vitamin B12 deficiency, iron deficiency and type 2 diabetes 1/29, plantar aspect of the right great toe in a diabetic with insensate neuropathy. We used silver collagen on this last week with debridement. He states this bled through on a couple of occasions and did not want any additional debridement although his wound appears as  though it does not require it. 2/12; plantar aspect of the right great toe in a diabetic with insensate neuropathy. We have been using silver collagen and he has been offloading this. Again he requests no debridement but I do not think a debridement is necessary. Electronic Signature(s) Signed: 01/19/2019 6:19:03 PM By: Linton Ham MD Entered By: Linton Ham on 01/19/2019 10:20:45 [name] ([id number]) -------------------------------------------------------------------------------- Physical Exam Details Patient Name: [name] Date of Service: 01/19/2019 9:00 AM Medical Record Number: [id number] Patient Account Number: [id number] Date of Birth/Sex: 10-17-1946 (73 y.o. M) Treating RN: Cornell Barman Primary Care Provider: Ria Bush Other Clinician: Referring Provider: Ria Bush Treating Provider/Extender: Tito Dine in Treatment: 3 Constitutional Sitting or standing Blood Pressure is within target range for patient.. Pulse regular and within target range for patient.Marland Kitchen Respirations regular, non-labored and within target range.. Temperature is normal and within the target range for the patient.Marland Kitchen appears in no distress. Eyes Conjunctivae clear. No discharge. Respiratory Respiratory effort is easy and symmetric bilaterally. Rate is normal at rest and on room air.. Cardiovascular Pedal pulses palpable and strong bilaterally.. Lymphatic None palpable in the popliteal area bilaterally. Integumentary (Hair, Skin) No skin issues are seen.. Neurological Diabetic insensate neuropathy. Psychiatric No evidence of depression, anxiety, or agitation. Calm, cooperative, and communicative. Appropriate interactions and affect.. Notes Wound exam; this is on the plantar tip of the right great toe. Very small opening remains of this original oval-shaped wound. The open area there remains has some nonviable surface although he does not want debridement and I  thought that it would be reasonable to  wait and see if this will close on its own with simply autolytic debridement. Electronic Signature(s) Signed: 01/19/2019 6:19:03 PM By: Linton Ham MD Entered By: Linton Ham on 01/19/2019 10:22:31 [name] ([id number]) -------------------------------------------------------------------------------- Physician Orders Details Patient Name: [name] Date of Service: 01/19/2019 9:00 AM Medical Record Number: [id number] Patient Account Number: [id number] Date of Birth/Sex: [birth year] (73 y.o. M) Treating RN: Cornell Barman Primary Care Provider: Ria Bush Other Clinician: Referring Provider: Ria Bush Treating Provider/Extender: Tito Dine in Treatment: 3 Verbal / Phone Orders: No Diagnosis Coding Wound Cleansing Wound #1 Right,Plantar Toe Great o Cleanse wound with mild soap and water Anesthetic (add to Medication List) Wound #1 Right,Plantar Toe Great o Topical Lidocaine 4% cream applied to wound bed prior to debridement (In Clinic Only). Primary Wound Dressing Wound #1 Right,Plantar Toe Great o Silver Collagen Secondary Dressing Wound #1 Right,Plantar Toe Great o Other - Offloading felt, gauze, conform Dressing Change Frequency Wound #1 Right,Plantar Toe Great o Change dressing every day. Follow-up Appointments Wound #1 Right,Plantar Toe Great o Return Appointment in 1 week. Off-Loading Wound #1 Right,Plantar Toe Great o Open toe surgical shoe with peg assist. o Other: - Keep Pressure off of foot. Electronic Signature(s) Signed: 01/19/2019 6:19:03 PM By: Linton Ham MD Signed: 01/20/2019 8:43:41 AM By: Gretta Cool, BSN, RN, CWS, Kim RN, BSN Entered By: Gretta Cool, BSN, RN, CWS, Kim on 01/19/2019 09:45:28 [name] ([id number]) -------------------------------------------------------------------------------- Problem List Details Patient Name: [name] Date of Service:  01/19/2019 9:00 AM Medical Record Number: [id number] Patient Account Number: [id number] Date of Birth/Sex: 1945-12-27 (73 y.o. M) Treating RN: Cornell Barman Primary Care Provider: Ria Bush Other Clinician: Referring Provider: Ria Bush Treating Provider/Extender: Tito Dine in Treatment: 3 Active Problems ICD-10 Evaluated Encounter Code Description Active Date Today Diagnosis E11.621 Type 2 diabetes mellitus with foot ulcer 12/29/2018 No Yes 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 to 12/29/2018 No Yes breakdown of skin E11.42 Type 2 diabetes mellitus with diabetic polyneuropathy 12/29/2018 No Yes Inactive Problems Resolved Problems Electronic Signature(s) Signed: 01/19/2019 6:19:03 PM By: Linton Ham MD Entered By: Linton Ham on 01/19/2019 10:19:38 [name] ([id number]) -------------------------------------------------------------------------------- Progress Note Details Patient Name: [name] Date of Service: 01/19/2019 9:00 AM Medical Record Number: [id number] Patient Account Number: [id number] Date of Birth/Sex: July 12, 1946 (73 y.o. M) Treating RN: Cornell Barman Primary Care Provider: Ria Bush Other Clinician: Referring Provider: Ria Bush Treating Provider/Extender: Tito Dine in Treatment: 3 Subjective History of Present Illness (HPI) ADMISSION 12/29/2018 This is a 73 year old man with type 2 diabetes and a history of neuropathy. 3 weeks ago he noted an open area on the plantar aspect of his right great toe or more precisely his wife noticed it. They think it might of come from a problem with the sole of the shoe he was using. In any case this had considerable size that he was seen on 12/23/2018 by his primary doctor. At that point it was stated that he had been using peroxide and topical alcohol. He was given an antibiotic ointment and put on oral Augmentin. Since then the  wound has contracted according to the patient. He does have diabetic neuropathy but does not have significant arterial disease. He had an evaluation in 2017 at [facility]. This included an ABI in the right of 1.13 on the left at 1.10 there was not felt to be any evidence of obstruction. I do not see TBI's or waveforms however  the patient was not felt to have hemodynamically significant obstruction. ABIs in our clinic were 1.07 on the right and 1.02 on the left Past medical history includes A. fib on Coumadin, prostate cancer, thoracic and abdominal aortic aneurysm, vitamin B12 deficiency, iron deficiency and type 2 diabetes 1/29, plantar aspect of the right great toe in a diabetic with insensate neuropathy. We used silver collagen on this last week with debridement. He states this bled through on a couple of occasions and did not want any additional debridement although his wound appears as though it does not require it. 2/12; plantar aspect of the right great toe in a diabetic with insensate neuropathy. We have been using silver collagen and he has been offloading this. Again he requests no debridement but I do not think a debridement is necessary. Objective Constitutional Sitting or standing Blood Pressure is within target range for patient.. Pulse regular and within target range for patient.Marland Kitchen Respirations regular, non-labored and within target range.. Temperature is normal and within the target range for the patient.Marland Kitchen appears in no distress. Vitals Time Taken: 9:12 AM, Height: 72 in, Weight: 224 lbs, BMI: 30.4, Temperature: 97.7 F, Pulse: 61 bpm, Respiratory Rate: 16 breaths/min, Blood Pressure: 114/55 mmHg. Eyes Conjunctivae clear. No discharge. Respiratory [name], [name] ([id number]) Respiratory effort is easy and symmetric bilaterally. Rate is normal at rest and on room air.. Cardiovascular Pedal pulses palpable and strong bilaterally.. Lymphatic None palpable in the popliteal area  bilaterally. Neurological Diabetic insensate neuropathy. Psychiatric No evidence of depression, anxiety, or agitation. Calm, cooperative, and communicative. Appropriate interactions and affect.. General Notes: Wound exam; this is on the plantar tip of the right great toe. Very small opening remains of this original oval- shaped wound. The open area there remains has some nonviable surface although he does not want debridement and I thought that it would be reasonable to wait and see if this will close on its own with simply autolytic debridement. Integumentary (Hair, Skin) No skin issues are seen.. Wound #1 status is Open. Original cause of wound was Blister. The wound is located on the [employer]. The wound measures 0.2cm length x 0.2cm width x 0.1cm depth; 0.031cm^2 area and 0.003cm^3 volume. There is Fat Layer (Subcutaneous Tissue) Exposed exposed. There is no tunneling or undermining noted. There is a medium amount of serous drainage noted. The wound margin is flat and intact. There is no granulation within the wound bed. There is no necrotic tissue within the wound bed. The periwound skin appearance exhibited: Callus, Maceration. The periwound skin appearance did not exhibit: Crepitus, Excoriation, Induration, Rash, Scarring, Dry/Scaly, Atrophie Blanche, Cyanosis, Ecchymosis, Hemosiderin Staining, Mottled, Pallor, Rubor, Erythema. Periwound temperature was noted as No Abnormality. The periwound has tenderness on palpation. 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 Primary Wound Dressing: Wound #1 Right,Plantar Toe GreatABEER, [name] ([id number]) Silver Collagen Secondary Dressing: Wound #1 Right,Plantar Toe Great: Other - Offloading felt, gauze, conform Dressing Change Frequency: Wound #1 Right,Plantar Toe Great: Change dressing every day. Follow-up Appointments: Wound #1 Right,Plantar Toe Great: Return Appointment in 1 week. Off-Loading: Wound #1 Right,Plantar Toe Great: Open  toe surgical shoe with peg assist. Other: - Keep Pressure off of foot. 1. I am continue with silver collagen gauze and rigorous offloading. 2. I will see him back in 2 weeks his wife is changing the dressing this seems to have made really nice progress. Hopefully healed at that point Electronic Signature(s) Signed: 01/19/2019 6:19:03 PM By: Linton Ham MD Entered By: Linton Ham on 01/19/2019 10:23:22 [name] ([id number]) -------------------------------------------------------------------------------- SuperBill Details Patient Name: [name] Date of Service: 01/19/2019 Medical Record Number: [id number] Patient Account Number: [id number] Date of Birth/Sex: Apr 30, 1946 (73 y.o. M) Treating RN: Cornell Barman Primary Care Provider: Ria Bush Other Clinician: Referring Provider: Ria Bush Treating Provider/Extender: Tito Dine in Treatment: 3 Diagnosis Coding ICD-10 Codes Code Description E11.621 Type 2 diabetes mellitus with foot ulcer 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 to breakdown of skin E11.42 Type 2 diabetes mellitus with diabetic polyneuropathy Facility Procedures CPT4 Code: [id number] Description: [phone] - WOUND CARE VISIT-LEV 2 EST PT Modifier: Quantity: 1 Physician Procedures CPT4 Code Description: [id number] - WC PHYS LEVEL 3 - EST PT ICD-10 Diagnosis Description 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

## 2019-01-21 NOTE — Telephone Encounter (Signed)
Left message asking pt to call office  °

## 2019-01-21 NOTE — Progress Notes (Signed)
DECORIAN, SCHUENEMANN ([id number]) Visit Report for 01/19/2019 Arrival Information Details Patient Name: [name], [name] Date of Service: 01/19/2019 9:00 AM Medical Record Number: [id number] Patient Account Number: [id number] Date of Birth/Sex: January 30, 1946 (73 y.o. M) Treating RN: Secundino Ginger Primary Care Dmarcus Decicco: Ria Bush Other Clinician: Referring Owyn Raulston: Ria Bush Treating Henok Heacock/Extender: Tito Dine in Treatment: 3 Visit Information History Since Last Visit Added or deleted any medications: No Patient Arrived: Cane Any new allergies or adverse reactions: No Arrival Time: 09:10 Had a fall or experienced change in No Accompanied By: self activities of daily living that may affect Transfer Assistance: None risk of falls: Patient Identification Verified: Yes Signs or symptoms of abuse/neglect since last visito No Secondary Verification Process Yes Hospitalized since last visit: No Completed: Implantable device outside of the clinic excluding No Patient Has Alerts: Yes cellular tissue based products placed in the center Patient Alerts: Patient on Blood since last visit: Thinner Has Dressing in Place as Prescribed: Yes Warfarin Pain Present Now: No DMII Electronic Signature(s) Signed: 01/19/2019 1:44:33 PM By: Secundino Ginger Entered By: Secundino Ginger on 01/19/2019 09:12:11 [name] ([id number]) -------------------------------------------------------------------------------- Clinic Level of Care Assessment Details Patient Name: [name] Date of Service: 01/19/2019 9:00 AM Medical Record Number: [id number] Patient Account Number: [id number] Date of Birth/Sex: 1946/02/29 (73 y.o. M) Treating RN: Cornell Barman Primary Care Miyani Cronic: Ria Bush Other Clinician: Referring Glendia Olshefski: Ria Bush Treating Gurtej Noyola/Extender: Tito Dine in Treatment: 3 Clinic Level of Care Assessment Items TOOL 4 Quantity Score []  - Use when only an  EandM is performed on FOLLOW-UP visit 0 ASSESSMENTS - Nursing Assessment / Reassessment []  - Reassessment of Co-morbidities (includes updates in patient status) 0 X- 1 5 Reassessment of Adherence to Treatment Plan ASSESSMENTS - Wound and Skin Assessment / Reassessment X - Simple Wound Assessment / Reassessment - one wound 1 5 []  - 0 Complex Wound Assessment / Reassessment - multiple wounds []  - 0 Dermatologic / Skin Assessment (not related to wound area) ASSESSMENTS - Focused Assessment []  - Circumferential Edema Measurements - multi extremities 0 []  - 0 Nutritional Assessment / Counseling / Intervention []  - 0 Lower Extremity Assessment (monofilament, tuning fork, pulses) []  - 0 Peripheral Arterial Disease Assessment (using hand held doppler) ASSESSMENTS - Ostomy and/or Continence Assessment and Care []  - Incontinence Assessment and Management 0 []  - 0 Ostomy Care Assessment and Management (repouching, etc.) PROCESS - Coordination of Care X - Simple Patient / Family Education for ongoing care 1 15 []  - 0 Complex (extensive) Patient / Family Education for ongoing care []  - 0 Staff obtains Programmer, systems, Records, Test Results / Process Orders []  - 0 Staff telephones HHA, Nursing Homes / Clarify orders / etc []  - 0 Routine Transfer to another Facility (non-emergent condition) []  - 0 Routine Hospital Admission (non-emergent condition) []  - 0 New Admissions / Biomedical engineer / Ordering NPWT, Apligraf, etc. []  - 0 Emergency Hospital Admission (emergent condition) X- 1 10 Simple Discharge Coordination [name], [name] ([id number]) []  - 0 Complex (extensive) Discharge Coordination PROCESS - Special Needs []  - Pediatric / Minor Patient Management 0 []  - 0 Isolation Patient Management []  - 0 Hearing / Language / Visual special needs []  - 0 Assessment of Community assistance (transportation, D/C planning, etc.) []  - 0 Additional assistance / Altered mentation []  -  0 Support Surface(s) Assessment (bed, cushion, seat, etc.) INTERVENTIONS - Wound Cleansing / Measurement X - Simple Wound Cleansing - one wound 1 5 []  - 0  Complex Wound Cleansing - multiple wounds X- 1 5 Wound Imaging (photographs - any number of wounds) []  - 0 Wound Tracing (instead of photographs) X- 1 5 Simple Wound Measurement - one wound []  - 0 Complex Wound Measurement - multiple wounds INTERVENTIONS - Wound Dressings []  - Small Wound Dressing one or multiple wounds 0 X- 1 15 Medium Wound Dressing one or multiple wounds []  - 0 Large Wound Dressing one or multiple wounds []  - 0 Application of Medications - topical []  - 0 Application of Medications - injection INTERVENTIONS - Miscellaneous []  - External ear exam 0 []  - 0 Specimen Collection (cultures, biopsies, blood, body fluids, etc.) []  - 0 Specimen(s) / Culture(s) sent or taken to Lab for analysis []  - 0 Patient Transfer (multiple staff / Civil Service fast streamer / Similar devices) []  - 0 Simple Staple / Suture removal (25 or less) []  - 0 Complex Staple / Suture removal (26 or more) []  - 0 Hypo / Hyperglycemic Management (close monitor of Blood Glucose) []  - 0 Ankle / Brachial Index (ABI) - do not check if billed separately X- 1 5 Vital Signs [name], Jim PETREY. ([id number]) Has the patient been seen at the hospital within the last three years: Yes Total Score: 70 Level Of Care: New/Established - Level 2 Electronic Signature(s) Signed: 01/20/2019 8:43:41 AM By: Gretta Cool, BSN, RN, CWS, Kim RN, BSN Entered By: Gretta Cool, BSN, RN, CWS, Kim on 01/19/2019 09:46:07 [name] ([id number]) -------------------------------------------------------------------------------- Encounter Discharge Information Details Patient Name: [name] Date of Service: 01/19/2019 9:00 AM Medical Record Number: [id number] Patient Account Number: [id number] Date of Birth/Sex: 1946/11/23 (73 y.o. M) Treating RN: Cornell Barman Primary Care Aretha Levi:  Ria Bush Other Clinician: Referring Bradley Bostelman: Ria Bush Treating Tanesia Butner/Extender: Tito Dine in Treatment: 3 Encounter Discharge Information Items Discharge Condition: Stable Ambulatory Status: Cane Discharge Destination: Home Transportation: Private Auto Accompanied By: self Schedule Follow-up Appointment: Yes Clinical Summary of Care: Electronic Signature(s) Signed: 01/20/2019 8:43:41 AM By: Gretta Cool, BSN, RN, CWS, Kim RN, BSN Entered By: Gretta Cool, BSN, RN, CWS, Kim on 01/19/2019 09:59:52 [name] ([id number]) -------------------------------------------------------------------------------- Lower Extremity Assessment Details Patient Name: [name] Date of Service: 01/19/2019 9:00 AM Medical Record Number: [id number] Patient Account Number: [id number] Date of Birth/Sex: 1946-12-03 (72 y.o. M) Treating RN: Secundino Ginger Primary Care Ariyel Jeangilles: Ria Bush Other Clinician: Referring Galya Dunnigan: Ria Bush Treating Chace Bisch/Extender: Tito Dine in Treatment: 3 Edema Assessment Assessed: [Left: No] [Right: No] [Left: Edema] [Right: :] Calf Left: Right: Point of Measurement: 34 cm From Medial Instep cm 36.5 cm Ankle Left: Right: Point of Measurement: 12 cm From Medial Instep cm 23 cm Vascular Assessment Claudication: Claudication Assessment [Right:None] Pulses: Dorsalis Pedis Palpable: [Right:Yes] Posterior Tibial Extremity colors, hair growth, and conditions: Extremity Color: [Right:Hyperpigmented] Hair Growth on Extremity: [Right:Yes] Temperature of Extremity: [Right:Cool] Capillary Refill: [Right:< 3 seconds] Toe Nail Assessment Left: Right: Thick: Yes Discolored: No Deformed: No Improper Length and Hygiene: No Electronic Signature(s) Signed: 01/19/2019 1:44:33 PM By: Secundino Ginger Entered By: Secundino Ginger on 01/19/2019 09:20:19 [name]  ([id number]) -------------------------------------------------------------------------------- Multi Wound Chart Details Patient Name: [name] Date of Service: 01/19/2019 9:00 AM Medical Record Number: [id number] Patient Account Number: [id number] Date of Birth/Sex: [birth year] (72 y.o. M) Treating RN: Cornell Barman Primary Care Chasidy Janak: Ria Bush Other Clinician: Referring Kristi Norment: Ria Bush Treating Derelle Cockrell/Extender: Tito Dine in Treatment: 3 Vital Signs Height(in): 72 Pulse(bpm): 61 Weight(lbs): 416 Blood Pressure(mmHg): 114/55 Body Mass Index(BMI): 30 Temperature(F): 97.7 Respiratory  Rate 16 (breaths/min): Photos: [N/A:N/A] Wound Location: Right Toe Great - Plantar N/A N/A Wounding Event: Blister N/A N/A Primary Etiology: Diabetic Wound/Ulcer of the N/A N/A Lower Extremity Comorbid History: Sleep Apnea, Arrhythmia, N/A N/A Hypertension, Type II Diabetes, Osteoarthritis, Neuropathy, Received Radiation Date Acquired: 12/11/2018 N/A N/A Weeks of Treatment: 3 N/A N/A Wound Status: Open N/A N/A Measurements L x W x D 0.2x0.2x0.1 N/A N/A (cm) Area (cm) : 0.031 N/A N/A Volume (cm) : 0.003 N/A N/A % Reduction in Area: 98.00% N/A N/A % Reduction in Volume: 98.10% N/A N/A Classification: Grade 2 N/A N/A Exudate Amount: Medium N/A N/A Exudate Type: Serous N/A N/A Exudate Color: amber N/A N/A Wound Margin: Flat and Intact N/A N/A Granulation Amount: None Present (0%) N/A N/A Necrotic Amount: None Present (0%) N/A N/A Exposed Structures: Fat Layer (Subcutaneous N/A N/A Tissue) Exposed: Yes Fascia: No Tendon: No [name], Jim BROMELL. ([id number]) Muscle: No Joint: No Bone: No Epithelialization: None N/A N/A Periwound Skin Texture: Callus: Yes N/A N/A Excoriation: No Induration: No Crepitus: No Rash: No Scarring: No Periwound Skin Moisture: Maceration: Yes N/A N/A Dry/Scaly: No Periwound Skin Color: Atrophie Blanche: No N/A  N/A Cyanosis: No Ecchymosis: No Erythema: No Hemosiderin Staining: No Mottled: No Pallor: No Rubor: No Temperature: No Abnormality N/A N/A Tenderness on Palpation: Yes N/A N/A Wound Preparation: Ulcer Cleansing: N/A N/A Rinsed/Irrigated with Saline Topical Anesthetic Applied: Other: lidocaine 4% Treatment Notes Electronic Signature(s) Signed: 01/19/2019 6:19:03 PM By: Linton Ham MD Entered By: Linton Ham on 01/19/2019 10:19:49 [name] ([id number]) -------------------------------------------------------------------------------- Multi-Disciplinary Care Plan Details Patient Name: [name] Date of Service: 01/19/2019 9:00 AM Medical Record Number: [id number] Patient Account Number: [id number] Date of Birth/Sex: 24-Sep-1946 (72 y.o. M) Treating RN: Cornell Barman Primary Care Theordore Cisnero: Ria Bush Other Clinician: Referring Shaylah Mcghie: Ria Bush Treating Nini Cavan/Extender: Tito Dine in Treatment: 3 Active Inactive Orientation to the Wound Care Program Nursing Diagnoses: Knowledge deficit related to the wound healing center program Goals: Patient/caregiver will verbalize understanding of the [facility] Date Initiated: 12/29/2018 Target Resolution Date: 01/05/2019 Goal Status: Active Interventions: Provide education on orientation to the wound center Notes: Peripheral Neuropathy Nursing Diagnoses: Knowledge deficit related to disease process and management of peripheral neurovascular dysfunction Goals: Patient/caregiver will verbalize understanding of disease process and disease management Date Initiated: 12/29/2018 Target Resolution Date: 01/05/2019 Goal Status: Active Interventions: Assess signs and symptoms of neuropathy upon admission and as needed Notes: Pressure Nursing Diagnoses: Knowledge deficit related to management of pressures ulcers Potential for impaired tissue integrity related to pressure,  friction, moisture, and shear Goals: Patient/caregiver will verbalize understanding of pressure ulcer management Date Initiated: 12/29/2018 Target Resolution Date: 01/05/2019 Goal Status: Active Interventions: [name], [name] ([id number]) Assess: immobility, friction, shearing, incontinence upon admission and as needed Assess offloading mechanisms upon admission and as needed Notes: Wound/Skin Impairment Nursing Diagnoses: Impaired tissue integrity Goals: Ulcer/skin breakdown will have a volume reduction of 30% by week 4 Date Initiated: 12/29/2018 Target Resolution Date: 01/29/2019 Goal Status: Active Interventions: Provide education on ulcer and skin care Treatment Activities: Referred to DME Keiston Manley for dressing supplies : 12/29/2018 Notes: Electronic Signature(s) Signed: 01/20/2019 8:43:41 AM By: Gretta Cool, BSN, RN, CWS, Kim RN, BSN Entered By: Gretta Cool, BSN, RN, CWS, Kim on 01/19/2019 09:44:36 [name] ([id number]) -------------------------------------------------------------------------------- Pain Assessment Details Patient Name: [name] Date of Service: 01/19/2019 9:00 AM Medical Record Number: [id number] Patient Account Number: [id number] Date of Birth/Sex: 05/07/1946 (72 y.o. M) Treating RN: Secundino Ginger Primary Care  Keryl Gholson: Ria Bush Other Clinician: Referring Anna-Marie Coller: Ria Bush Treating Arhaan Chesnut/Extender: Tito Dine in Treatment: 3 Active Problems Location of Pain Severity and Description of Pain Patient Has Paino No Site Locations Pain Management and Medication Current Pain Management: Notes pt denies any pain at this time. Electronic Signature(s) Signed: 01/19/2019 1:44:33 PM By: Secundino Ginger Entered By: Secundino Ginger on 01/19/2019 09:12:33 [name] ([id number]) -------------------------------------------------------------------------------- Patient/Caregiver Education Details Patient Name: [name] Date of Service:  01/19/2019 9:00 AM Medical Record Number: [id number] Patient Account Number: [id number] Date of Birth/Gender: 03/05/1946 (73 y.o. M) Treating RN: Cornell Barman Primary Care Physician: Ria Bush Other Clinician: Referring Physician: Ria Bush Treating Physician/Extender: Tito Dine in Treatment: 3 Education Assessment Education Provided To: Patient Education Topics Provided Wound/Skin Impairment: Methods: Demonstration, Explain/Verbal Responses: State content correctly Electronic Signature(s) Signed: 01/20/2019 8:43:41 AM By: Gretta Cool, BSN, RN, CWS, Kim RN, BSN Entered By: Gretta Cool, BSN, RN, CWS, Kim on 01/19/2019 10:00:04 [name] ([id number]) -------------------------------------------------------------------------------- Wound Assessment Details Patient Name: [name] Date of Service: 01/19/2019 9:00 AM Medical Record Number: [id number] Patient Account Number: [id number] Date of Birth/Sex: [birth year] (72 y.o. M) Treating RN: Secundino Ginger Primary Care Nikolas Casher: Ria Bush Other Clinician: Referring Zebulon Gantt: Ria Bush Treating Dedra Matsuo/Extender: Tito Dine in Treatment: 3 Wound Status Wound Number: 1 Primary Diabetic Wound/Ulcer of the Lower Extremity Etiology: Wound Location: Right Toe Great - Plantar Wound Open Wounding Event: Blister Status: Date Acquired: 12/11/2018 Comorbid Sleep Apnea, Arrhythmia, Hypertension, Type II Weeks Of Treatment: 3 History: Diabetes, Osteoarthritis, Neuropathy, Received Clustered Wound: No Radiation Photos Photo Uploaded By: Secundino Ginger on 01/19/2019 09:24:32 Wound Measurements Length: (cm) 0.2 Width: (cm) 0.2 Depth: (cm) 0.1 Area: (cm) 0.031 Volume: (cm) 0.003 % Reduction in Area: 98% % Reduction in Volume: 98.1% Epithelialization: None Tunneling: No Undermining: No Wound Description Classification: Grade 2 Foul Odor Wound Margin: Flat and Intact Slough/Fi Exudate Amount:  Medium Exudate Type: Serous Exudate Color: amber After Cleansing: No brino Yes Wound Bed Granulation Amount: None Present (0%) Exposed Structure Necrotic Amount: None Present (0%) Fascia Exposed: No Fat Layer (Subcutaneous Tissue) Exposed: Yes Tendon Exposed: No Muscle Exposed: No Joint Exposed: No Bone Exposed: No Periwound Skin Texture [name], Jim SIEMON. ([id number]) Texture Color No Abnormalities Noted: No No Abnormalities Noted: No Callus: Yes Atrophie Blanche: No Crepitus: No Cyanosis: No Excoriation: No Ecchymosis: No Induration: No Erythema: No Rash: No Hemosiderin Staining: No Scarring: No Mottled: No Pallor: No Moisture Rubor: No No Abnormalities Noted: No Dry / Scaly: No Temperature / Pain Maceration: Yes Temperature: No Abnormality Tenderness on Palpation: Yes Wound Preparation Ulcer Cleansing: Rinsed/Irrigated with Saline Topical Anesthetic Applied: Other: lidocaine 4%, Electronic Signature(s) Signed: 01/19/2019 1:44:33 PM By: Secundino Ginger Entered By: Secundino Ginger on 01/19/2019 09:18:59 [name] ([id number]) -------------------------------------------------------------------------------- Vitals Details Patient Name: [name] Date of Service: 01/19/2019 9:00 AM Medical Record Number: [id number] Patient Account Number: [id number] Date of Birth/Sex: 01/31/1946 (73 y.o. M) Treating RN: Secundino Ginger Primary Care Lyndy Russman: Ria Bush Other Clinician: Referring Annastasia Haskins: Ria Bush Treating Lisa Milian/Extender: Tito Dine in Treatment: 3 Vital Signs Time Taken: 09:12 Temperature (F): 97.7 Height (in): 72 Pulse (bpm): 61 Weight (lbs): 224 Respiratory Rate (breaths/min): 16 Body Mass Index (BMI): 30.4 Blood Pressure (mmHg): 114/55 Reference Range: 80 - 120 mg / dl Airway Electronic Signature(s) Signed: 01/19/2019 1:44:33 PM By: Secundino Ginger Entered BySecundino Ginger on 01/19/2019 09:13:23

## 2019-01-22 ENCOUNTER — Other Ambulatory Visit: Payer: Self-pay | Admitting: Family Medicine

## 2019-01-24 ENCOUNTER — Ambulatory Visit
Admission: RE | Admit: 2019-01-24 | Discharge: 2019-01-24 | Disposition: A | Discharge status: Home / Self Care | Payer: Medicare Other | Source: Ambulatory Visit | Attending: Radiation Oncology | Admitting: Radiation Oncology

## 2019-01-24 DIAGNOSIS — C61 Malignant neoplasm of prostate: Secondary | ICD-10-CM | POA: Diagnosis not present

## 2019-01-24 DIAGNOSIS — Z510 Encounter for antineoplastic radiation therapy: Secondary | ICD-10-CM | POA: Diagnosis not present

## 2019-01-24 NOTE — Telephone Encounter (Signed)
Electronic refill request Zanaflex Last refill 08/13/18 #250/1 Last office visit 12/23/18 Dr. Danise Mina is out of the office until Friday

## 2019-01-25 ENCOUNTER — Ambulatory Visit
Admission: RE | Admit: 2019-01-25 | Discharge: 2019-01-25 | Disposition: A | Discharge status: Home / Self Care | Payer: Medicare Other | Source: Ambulatory Visit | Attending: Radiation Oncology | Admitting: Radiation Oncology

## 2019-01-25 ENCOUNTER — Encounter: Payer: Self-pay | Admitting: Family Medicine

## 2019-01-25 DIAGNOSIS — Z510 Encounter for antineoplastic radiation therapy: Secondary | ICD-10-CM | POA: Diagnosis not present

## 2019-01-25 DIAGNOSIS — H25813 Combined forms of age-related cataract, bilateral: Secondary | ICD-10-CM | POA: Diagnosis not present

## 2019-01-25 DIAGNOSIS — C61 Malignant neoplasm of prostate: Secondary | ICD-10-CM | POA: Diagnosis not present

## 2019-01-25 NOTE — Telephone Encounter (Signed)
Left message asking pt to call office and also mailed letter

## 2019-01-25 NOTE — Telephone Encounter (Signed)
He should have enough to last until the end of February.

## 2019-01-25 NOTE — Telephone Encounter (Signed)
Noted  

## 2019-01-26 ENCOUNTER — Ambulatory Visit
Admission: RE | Admit: 2019-01-26 | Discharge: 2019-01-26 | Disposition: A | Discharge status: Home / Self Care | Payer: Medicare Other | Source: Ambulatory Visit | Attending: Radiation Oncology | Admitting: Radiation Oncology

## 2019-01-26 ENCOUNTER — Other Ambulatory Visit: Payer: Self-pay | Admitting: *Deleted

## 2019-01-26 DIAGNOSIS — C61 Malignant neoplasm of prostate: Secondary | ICD-10-CM | POA: Diagnosis not present

## 2019-01-26 DIAGNOSIS — Z510 Encounter for antineoplastic radiation therapy: Secondary | ICD-10-CM | POA: Diagnosis not present

## 2019-01-26 MED ORDER — TAMSULOSIN HCL 0.4 MG PO CAPS: 0.4000 mg | ORAL_CAPSULE | Freq: Two times a day (BID) | ORAL | 11 refills | Status: DC

## 2019-01-27 ENCOUNTER — Ambulatory Visit: Payer: Medicare Other

## 2019-01-27 ENCOUNTER — Ambulatory Visit
Admission: RE | Admit: 2019-01-27 | Discharge: 2019-01-27 | Disposition: A | Discharge status: Home / Self Care | Payer: Medicare Other | Source: Ambulatory Visit | Attending: Radiation Oncology | Admitting: Radiation Oncology

## 2019-01-27 DIAGNOSIS — Z510 Encounter for antineoplastic radiation therapy: Secondary | ICD-10-CM | POA: Diagnosis not present

## 2019-01-27 DIAGNOSIS — C61 Malignant neoplasm of prostate: Secondary | ICD-10-CM | POA: Diagnosis not present

## 2019-01-28 ENCOUNTER — Ambulatory Visit: Payer: Medicare Other

## 2019-01-31 ENCOUNTER — Ambulatory Visit
Admission: RE | Admit: 2019-01-31 | Discharge: 2019-01-31 | Disposition: A | Discharge status: Home / Self Care | Payer: Medicare Other | Source: Ambulatory Visit | Attending: Radiation Oncology | Admitting: Radiation Oncology

## 2019-01-31 ENCOUNTER — Ambulatory Visit (INDEPENDENT_AMBULATORY_CARE_PROVIDER_SITE_OTHER): Payer: Medicare Other

## 2019-01-31 ENCOUNTER — Other Ambulatory Visit: Payer: Self-pay | Admitting: Family Medicine

## 2019-01-31 ENCOUNTER — Ambulatory Visit: Payer: Medicare Other

## 2019-01-31 DIAGNOSIS — I4891 Unspecified atrial fibrillation: Secondary | ICD-10-CM

## 2019-01-31 DIAGNOSIS — Z510 Encounter for antineoplastic radiation therapy: Secondary | ICD-10-CM | POA: Diagnosis not present

## 2019-01-31 DIAGNOSIS — C61 Malignant neoplasm of prostate: Secondary | ICD-10-CM | POA: Diagnosis not present

## 2019-01-31 DIAGNOSIS — G894 Chronic pain syndrome: Secondary | ICD-10-CM

## 2019-01-31 DIAGNOSIS — Z5181 Encounter for therapeutic drug level monitoring: Secondary | ICD-10-CM | POA: Diagnosis not present

## 2019-01-31 LAB — POCT INR: INR: 2.5 (ref 2.0–3.0)

## 2019-01-31 NOTE — Patient Instructions (Signed)
Please continue dosage of 1 tablet every day except 1/2 tablet on Mondays, Wednesdays and Fridays.  Recheck INR in 5 weeks.

## 2019-01-31 NOTE — Progress Notes (Signed)
Pt requested BP check during visit, as he does not feel that his cuffs at home are accurate.  When advised of his readings (103/65, 54), he states "good, that's where I like them to be".  Pt reports that he feels good today, was just curious as to the reading.

## 2019-02-01 ENCOUNTER — Ambulatory Visit
Admission: RE | Admit: 2019-02-01 | Discharge: 2019-02-01 | Disposition: A | Discharge status: Home / Self Care | Payer: Medicare Other | Source: Ambulatory Visit | Attending: Radiation Oncology | Admitting: Radiation Oncology

## 2019-02-01 ENCOUNTER — Ambulatory Visit: Payer: Medicare Other

## 2019-02-01 DIAGNOSIS — Z510 Encounter for antineoplastic radiation therapy: Secondary | ICD-10-CM | POA: Diagnosis not present

## 2019-02-01 DIAGNOSIS — C61 Malignant neoplasm of prostate: Secondary | ICD-10-CM | POA: Diagnosis not present

## 2019-02-01 MED ORDER — HYDROCODONE-ACETAMINOPHEN 10-325 MG PO TABS: 1.0000 | ORAL_TABLET | Freq: Three times a day (TID) | ORAL | 0 refills | Status: DC | PRN

## 2019-02-01 NOTE — Telephone Encounter (Signed)
Eprescribed.

## 2019-02-01 NOTE — Telephone Encounter (Signed)
Name of Medication: Hydrocodone-APAP Name of Pharmacy: [facility] Last Fill or Written Date and Quantity: 12/23/18 Last Office Visit and Type: pain f/u Next Office Visit and Type: med f/u Last Controlled Substance Agreement Date: 07/06/17 Last UDS: 07/06/17

## 2019-02-02 ENCOUNTER — Encounter: Payer: Medicare Other | Admitting: Internal Medicine

## 2019-02-02 ENCOUNTER — Ambulatory Visit
Admission: RE | Admit: 2019-02-02 | Discharge: 2019-02-02 | Disposition: A | Discharge status: Home / Self Care | Payer: Medicare Other | Source: Ambulatory Visit | Attending: Radiation Oncology | Admitting: Radiation Oncology

## 2019-02-02 DIAGNOSIS — E11621 Type 2 diabetes mellitus with foot ulcer: Secondary | ICD-10-CM | POA: Diagnosis not present

## 2019-02-02 DIAGNOSIS — L84 Corns and callosities: Secondary | ICD-10-CM | POA: Diagnosis not present

## 2019-02-02 DIAGNOSIS — I4891 Unspecified atrial fibrillation: Secondary | ICD-10-CM | POA: Diagnosis not present

## 2019-02-02 DIAGNOSIS — L97512 Non-pressure chronic ulcer of other part of right foot with fat layer exposed: Secondary | ICD-10-CM | POA: Diagnosis not present

## 2019-02-02 DIAGNOSIS — Z7901 Long term (current) use of anticoagulants: Secondary | ICD-10-CM | POA: Diagnosis not present

## 2019-02-02 DIAGNOSIS — E1142 Type 2 diabetes mellitus with diabetic polyneuropathy: Secondary | ICD-10-CM | POA: Diagnosis not present

## 2019-02-02 DIAGNOSIS — L97511 Non-pressure chronic ulcer of other part of right foot limited to breakdown of skin: Secondary | ICD-10-CM | POA: Diagnosis not present

## 2019-02-02 DIAGNOSIS — C61 Malignant neoplasm of prostate: Secondary | ICD-10-CM | POA: Diagnosis not present

## 2019-02-02 DIAGNOSIS — I714 Abdominal aortic aneurysm, without rupture: Secondary | ICD-10-CM | POA: Diagnosis not present

## 2019-02-02 DIAGNOSIS — Z510 Encounter for antineoplastic radiation therapy: Secondary | ICD-10-CM | POA: Diagnosis not present

## 2019-02-03 ENCOUNTER — Ambulatory Visit
Admission: RE | Admit: 2019-02-03 | Discharge: 2019-02-03 | Disposition: A | Discharge status: Home / Self Care | Payer: Medicare Other | Source: Ambulatory Visit | Attending: Radiation Oncology | Admitting: Radiation Oncology

## 2019-02-03 DIAGNOSIS — C61 Malignant neoplasm of prostate: Secondary | ICD-10-CM | POA: Diagnosis not present

## 2019-02-03 DIAGNOSIS — Z510 Encounter for antineoplastic radiation therapy: Secondary | ICD-10-CM | POA: Diagnosis not present

## 2019-02-03 NOTE — Progress Notes (Signed)
JADAN, HINOJOS ([id number]) Visit Report for 02/02/2019 HPI Details Patient Name: [name], [name] Date of Service: 02/02/2019 9:00 AM Medical Record Number: [id number] Patient Account Number: [id number] Date of Birth/Sex: [birth year] (72 y.o. M) Treating RN: Cornell Barman Primary Care Provider: Ria Bush Other Clinician: Referring Provider: Ria Bush Treating Provider/Extender: Tito Dine in Treatment: 5 History of Present Illness HPI Description: ADMISSION 12/29/2018 This is a 73 year old man with type 2 diabetes and a history of neuropathy. 3 weeks ago he noted an open area on the plantar aspect of his right great toe or more precisely his wife noticed it. They think it might of come from a problem with the sole of the shoe he was using. In any case this had considerable size that he was seen on 12/23/2018 by his primary doctor. At that point it was stated that he had been using peroxide and topical alcohol. He was given an antibiotic ointment and put on oral Augmentin. Since then the wound has contracted according to the patient. He does have diabetic neuropathy but does not have significant arterial disease. He had an evaluation in 2017 at [facility]. This included an ABI in the right of 1.13 on the left at 1.10 there was not felt to be any evidence of obstruction. I do not see TBI's or waveforms however the patient was not felt to have hemodynamically significant obstruction. ABIs in our clinic were 1.07 on the right and 1.02 on the left Past medical history includes A. fib on Coumadin, prostate cancer, thoracic and abdominal aortic aneurysm, vitamin B12 deficiency, iron deficiency and type 2 diabetes 1/29, plantar aspect of the right great toe in a diabetic with insensate neuropathy. We used silver collagen on this last week with debridement. He states this bled through on a couple of occasions and did not want any additional debridement although his wound appears as  though it does not require it. 2/12; plantar aspect of the right great toe in a diabetic with insensate neuropathy. We have been using silver collagen and he has been offloading this. Again he requests no debridement but I do not think a debridement is necessary. 2/26; the patient is not using his Darco shoe anymore he broke the strap. He ran out of Prisma so he has been using Neosporin. In spite of this he comes back with the area covered in eschar. I removed some of this there is no open area here. He has old looking running shoes but he is using a Dr. Felicie Morn insole Electronic Signature(s) Signed: 02/02/2019 4:42:30 PM By: Linton Ham MD Entered By: Linton Ham on 02/02/2019 10:29:26 [name] ([id number]) -------------------------------------------------------------------------------- Callus Pairing Details Patient Name: [name] Date of Service: 02/02/2019 9:00 AM Medical Record Number: [id number] Patient Account Number: [id number] Date of Birth/Sex: December 16, 1945 (73 y.o. M) Treating RN: Cornell Barman Primary Care Provider: Ria Bush Other Clinician: Referring Provider: Ria Bush Treating Provider/Extender: Tito Dine in Treatment: 5 Procedure Performed for: Wound #1 Right,Plantar Toe Great Performed By: Physician Ricard Dillon, MD Post Procedure Diagnosis Same as Pre-procedure Electronic Signature(s) Signed: 02/02/2019 4:42:30 PM By: Linton Ham MD Entered By: Linton Ham on 02/02/2019 10:21:07 [name] ([id number]) -------------------------------------------------------------------------------- Physical Exam Details Patient Name: [name] Date of Service: 02/02/2019 9:00 AM Medical Record Number: [id number] Patient Account Number: [id number] Date of Birth/Sex: 05-Nov-1946 (73 y.o. M) Treating RN: Cornell Barman Primary Care Provider: Ria Bush Other Clinician: Referring Provider: Ria Bush Treating  Provider/Extender: Linton Ham  G Weeks in Treatment: 5 Constitutional Patient is hypotensive. But he appears well.. Slightly bradycardic. Temperature is normal and within the target range for the patient.Marland Kitchen appears in no distress. Eyes Conjunctivae clear. No discharge. Respiratory Respiratory effort is easy and symmetric bilaterally. Rate is normal at rest and on room air.. . Cardiovascular Pedal pulses palpable and strong bilaterally.. Lymphatic None palpable in the popliteal or inguinal area. Integumentary (Hair, Skin) No other primary skin issues are seen. Neurological Diabetic insensate neuropathy.. Notes Wound exam; I used a #3 curette to remove some of the surface callus and thick skin. There is no open area here. No surrounding erythema and the wound is closed Electronic Signature(s) Signed: 02/02/2019 4:42:30 PM By: Linton Ham MD Entered By: Linton Ham on 02/02/2019 10:31:07 [name] ([id number]) -------------------------------------------------------------------------------- Physician Orders Details Patient Name: [name] Date of Service: 02/02/2019 9:00 AM Medical Record Number: [id number] Patient Account Number: [id number] Date of Birth/Sex: May 30, 1946 (73 y.o. M) Treating RN: Cornell Barman Primary Care Provider: Ria Bush Other Clinician: Referring Provider: Ria Bush Treating Provider/Extender: Tito Dine in Treatment: 5 Verbal / Phone Orders: No Diagnosis Coding Discharge From [facility] Services Wound #1 Right,Plantar Toe Great o Discharge from [place] - treatment complete Electronic Signature(s) Signed: 02/02/2019 4:42:30 PM By: Linton Ham MD Signed: 02/02/2019 5:55:39 PM By: Gretta Cool, BSN, RN, CWS, Kim RN, BSN Entered By: Gretta Cool, BSN, RN, CWS, Kim on 02/02/2019 09:39:32 [name] ([id number]) -------------------------------------------------------------------------------- Problem List  Details Patient Name: [name] Date of Service: 02/02/2019 9:00 AM Medical Record Number: [id number] Patient Account Number: [id number] Date of Birth/Sex: 06/12/1946 (73 y.o. M) Treating RN: Cornell Barman Primary Care Provider: Ria Bush Other Clinician: Referring Provider: Ria Bush Treating Provider/Extender: Tito Dine in Treatment: 5 Active Problems ICD-10 Evaluated Encounter Code Description Active Date Today Diagnosis E11.621 Type 2 diabetes mellitus with foot ulcer 12/29/2018 No Yes 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 to 12/29/2018 No Yes breakdown of skin E11.42 Type 2 diabetes mellitus with diabetic polyneuropathy 12/29/2018 No Yes Inactive Problems Resolved Problems Electronic Signature(s) Signed: 02/02/2019 4:42:30 PM By: Linton Ham MD Entered By: Linton Ham on 02/02/2019 10:19:36 [name] ([id number]) -------------------------------------------------------------------------------- Progress Note Details Patient Name: [name] Date of Service: 02/02/2019 9:00 AM Medical Record Number: [id number] Patient Account Number: [id number] Date of Birth/Sex: Apr 17, 1946 (72 y.o. M) Treating RN: Cornell Barman Primary Care Provider: Ria Bush Other Clinician: Referring Provider: Ria Bush Treating Provider/Extender: Tito Dine in Treatment: 5 Subjective History of Present Illness (HPI) ADMISSION 12/29/2018 This is a 73 year old man with type 2 diabetes and a history of neuropathy. 3 weeks ago he noted an open area on the plantar aspect of his right great toe or more precisely his wife noticed it. They think it might of come from a problem with the sole of the shoe he was using. In any case this had considerable size that he was seen on 12/23/2018 by his primary doctor. At that point it was stated that he had been using peroxide and topical alcohol. He was given an  antibiotic ointment and put on oral Augmentin. Since then the wound has contracted according to the patient. He does have diabetic neuropathy but does not have significant arterial disease. He had an evaluation in 2017 at [facility]. This included an ABI in the right of 1.13 on the left at 1.10 there was not felt to be any evidence of obstruction. I do  not see TBI's or waveforms however the patient was not felt to have hemodynamically significant obstruction. ABIs in our clinic were 1.07 on the right and 1.02 on the left Past medical history includes A. fib on Coumadin, prostate cancer, thoracic and abdominal aortic aneurysm, vitamin B12 deficiency, iron deficiency and type 2 diabetes 1/29, plantar aspect of the right great toe in a diabetic with insensate neuropathy. We used silver collagen on this last week with debridement. He states this bled through on a couple of occasions and did not want any additional debridement although his wound appears as though it does not require it. 2/12; plantar aspect of the right great toe in a diabetic with insensate neuropathy. We have been using silver collagen and he has been offloading this. Again he requests no debridement but I do not think a debridement is necessary. 2/26; the patient is not using his Darco shoe anymore he broke the strap. He ran out of Prisma so he has been using Neosporin. In spite of this he comes back with the area covered in eschar. I removed some of this there is no open area here. He has old looking running shoes but he is using a Dr. Felicie Morn insole Objective Constitutional Patient is hypotensive. But he appears well.. Slightly bradycardic. Temperature is normal and within the target range for the patient.Marland Kitchen appears in no distress. Vitals Time Taken: 9:20 AM, Height: 72 in, Weight: 224 lbs, BMI: 30.4, Temperature: 97.5 F, Pulse: 59 bpm, Respiratory Rate: 18 breaths/min, Blood Pressure: 99/58 mmHg. Eyes Conjunctivae clear. No  discharge. [name] ([id number]) Respiratory Respiratory effort is easy and symmetric bilaterally. Rate is normal at rest and on room air.. Cardiovascular Pedal pulses palpable and strong bilaterally.. Lymphatic None palpable in the popliteal or inguinal area. Neurological Diabetic insensate neuropathy.. General Notes: Wound exam; I used a #3 curette to remove some of the surface callus and thick skin. There is no open area here. No surrounding erythema and the wound is closed Integumentary (Hair, Skin) No other primary skin issues are seen. Wound #1 status is Open. Original cause of wound was Blister. The wound is located on the [employer]. The wound measures 0.1cm length x 0.1cm width x 0.1cm depth; 0.008cm^2 area and 0.001cm^3 volume. There is Fat Layer (Subcutaneous Tissue) Exposed exposed. There is no tunneling or undermining noted. There is a none present amount of drainage noted. The wound margin is flat and intact. There is no granulation within the wound bed. There is no necrotic tissue within the wound bed. The periwound skin appearance exhibited: Callus, Maceration. The periwound skin appearance did not exhibit: Crepitus, Excoriation, Induration, Rash, Scarring, Dry/Scaly, Atrophie Blanche, Cyanosis, Ecchymosis, Hemosiderin Staining, Mottled, Pallor, Rubor, Erythema. Periwound temperature was noted as No Abnormality. 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 An Callus Pairing procedure was performed by Ricard Dillon, MD. Post procedure Diagnosis Wound #1: Same as Pre-Procedure Plan [name] ([id number]) Discharge From [facility]  Services: Wound #1 Right,Plantar Toe Great: Discharge from [place] - treatment complete 1. I think the patient can be discharged from the wound care center 2. I talked to him about offloading this toe which for one reason or another seems to  be point of pressure and friction in his foot wear. The patient expressed some understanding. He does not look like the type of person who will be compliant with expensive diabetic shoes and inserts although if he comes back in short order that may be what is going to be required Electronic Signature(s) Signed: 02/02/2019 4:42:30 PM By: Linton Ham MD Entered By: Linton Ham on 02/02/2019 10:32:15 [name] ([id number]) -------------------------------------------------------------------------------- SuperBill Details Patient Name: [name] Date of Service: 02/02/2019 Medical Record Number: [id number] Patient Account Number: [id number] Date of Birth/Sex: [birth year] (72 y.o. M) Treating RN: Cornell Barman Primary Care Provider: Ria Bush Other Clinician: Referring Provider: Ria Bush Treating Provider/Extender: Tito Dine in Treatment: 5 Diagnosis Coding ICD-10 Codes Code Description E11.621 Type 2 diabetes mellitus with foot ulcer 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 to breakdown of skin E11.42 Type 2 diabetes mellitus with diabetic polyneuropathy Facility Procedures CPT4 Code Description: [id number] 11055 - PARE BENIGN LES; SGL ICD-10 Diagnosis Description L97.511 Non-pressure chronic ulcer of other part of right foot limite Modifier: d to breakdown of Quantity: 1 skin Physician Procedures CPT4 Code Description: [id number] - WC PHYS PARE BENIGN LES; SGL ICD-10 Diagnosis Description 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 Modifier: to breakdown of Quantity: 1 skin Electronic Signature(s) Signed: 02/02/2019 4:42:30 PM By: Linton Ham MD Entered By: Linton Ham on 02/02/2019 10:32:37

## 2019-02-03 NOTE — Progress Notes (Signed)
HAIM, HANSSON ([id number]) Visit Report for 02/02/2019 Arrival Information Details Patient Name: [name], [name] Date of Service: 02/02/2019 9:00 AM Medical Record Number: [id number] Patient Account Number: [id number] Date of Birth/Sex: Sep 01, 1946 (72 y.o. M) Treating RN: Montey Hora Primary Care Alanna Storti: Ria Bush Other Clinician: Referring Laythan Hayter: Ria Bush Treating Allyn Bartelson/Extender: Tito Dine in Treatment: 5 Visit Information History Since Last Visit Added or deleted any medications: No Patient Arrived: Cane Any new allergies or adverse reactions: No Arrival Time: 09:14 Had a fall or experienced change in No Accompanied By: self activities of daily living that may affect Transfer Assistance: None risk of falls: Patient Identification Verified: Yes Signs or symptoms of abuse/neglect since last No Secondary Verification Process Yes visito Completed: Hospitalized since last visit: No Patient Has Alerts: Yes Implantable device outside of the clinic No Patient Alerts: Patient on Blood excluding Thinner cellular tissue based products placed in the Warfarin center DMII since last visit: Has Dressing in Place as Prescribed: No Has Footwear/Offloading in Place as No Prescribed: Right: Surgical Shoe with Pressure Relief Insole Pain Present Now: No Electronic Signature(s) Signed: 02/02/2019 4:40:34 PM By: Montey Hora Entered By: Montey Hora on 02/02/2019 09:19:58 [name] ([id number]) -------------------------------------------------------------------------------- Encounter Discharge Information Details Patient Name: [name] Date of Service: 02/02/2019 9:00 AM Medical Record Number: [id number] Patient Account Number: [id number] Date of Birth/Sex: [birth year] (72 y.o. M) Treating RN: Cornell Barman Primary Care Tung Pustejovsky: Ria Bush Other Clinician: Referring Aquanetta Schwarz: Ria Bush Treating Alston Berrie/Extender:  Tito Dine in Treatment: 5 Encounter Discharge Information Items Discharge Condition: Stable Ambulatory Status: Ambulatory Discharge Destination: Home Transportation: Private Auto Accompanied By: self Schedule Follow-up Appointment: No Clinical Summary of Care: Electronic Signature(s) Signed: 02/02/2019 12:05:24 PM By: Gretta Cool, BSN, RN, CWS, Kim RN, BSN Entered By: Gretta Cool, BSN, RN, CWS, Kim on 02/02/2019 12:05:24 [name] ([id number]) -------------------------------------------------------------------------------- Lower Extremity Assessment Details Patient Name: [name] Date of Service: 02/02/2019 9:00 AM Medical Record Number: [id number] Patient Account Number: [id number] Date of Birth/Sex: 10/03/1946 (72 y.o. M) Treating RN: Montey Hora Primary Care Ladora Osterberg: Ria Bush Other Clinician: Referring Myeisha Kruser: Ria Bush Treating Jerrick Farve/Extender: Tito Dine in Treatment: 5 Edema Assessment Assessed: [Left: No] [Right: No] Edema: [Left: N] [Right: o] Vascular Assessment Pulses: Dorsalis Pedis Palpable: [Right:Yes] Posterior Tibial Extremity colors, hair growth, and conditions: Extremity Color: [Right:Normal] Hair Growth on Extremity: [Right:No] Temperature of Extremity: [Right:Cool] Capillary Refill: [Right:< 3 seconds] Toe Nail Assessment Left: Right: Thick: Yes Discolored: Yes Deformed: No Improper Length and Hygiene: No Electronic Signature(s) Signed: 02/02/2019 4:40:34 PM By: Montey Hora Entered By: Montey Hora on 02/02/2019 09:21:01 [name] ([id number]) -------------------------------------------------------------------------------- Multi Wound Chart Details Patient Name: [name] Date of Service: 02/02/2019 9:00 AM Medical Record Number: [id number] Patient Account Number: [id number] Date of Birth/Sex: [birth year] (72 y.o. M) Treating RN: Cornell Barman Primary Care Yuridia Couts: Ria Bush  Other Clinician: Referring Jervon Ream: Ria Bush Treating Lailoni Baquera/Extender: Tito Dine in Treatment: 5 Vital Signs Height(in): 72 Pulse(bpm): 32 Weight(lbs): 224 Blood Pressure(mmHg): 99/58 Body Mass Index(BMI): 30 Temperature(F): 97.5 Respiratory Rate 18 (breaths/min): Photos: [1:No Photos] [N/A:N/A] Wound Location: [1:Right Toe Great - Plantar] [N/A:N/A] Wounding Event: [1:Blister] [N/A:N/A] Primary Etiology: [1:Diabetic Wound/Ulcer of the Lower Extremity] [N/A:N/A] Comorbid History: [1:Sleep Apnea, Arrhythmia, Hypertension, Type II Diabetes, Osteoarthritis, Neuropathy, Received Radiation] [N/A:N/A] Date Acquired: [1:12/11/2018] [N/A:N/A] Weeks of Treatment: [1:5] [N/A:N/A] Wound Status: [1:Open] [N/A:N/A] Measurements L x W x D [1:0.1x0.1x0.1] [N/A:N/A] (cm) Area (cm) : [1:0.008] [N/A:N/A] Volume (cm) : [  1:0.001] [N/A:N/A] % Reduction in Area: [1:99.50%] [N/A:N/A] % Reduction in Volume: [1:99.40%] [N/A:N/A] Classification: [1:Grade 2] [N/A:N/A] Exudate Amount: [1:None Present] [N/A:N/A] Wound Margin: [1:Flat and Intact] [N/A:N/A] Granulation Amount: [1:None Present (0%)] [N/A:N/A] Necrotic Amount: [1:None Present (0%)] [N/A:N/A] Exposed Structures: [1:Fat Layer (Subcutaneous Tissue) Exposed: Yes Fascia: No Tendon: No Muscle: No Joint: No Bone: No] [N/A:N/A] Epithelialization: [1:Large (67-100%)] [N/A:N/A] Periwound Skin Texture: [1:Callus: Yes Excoriation: No Induration: No Crepitus: No] [N/A:N/A] Rash: No Scarring: No Periwound Skin Moisture: Maceration: Yes N/A N/A Dry/Scaly: No Periwound Skin Color: Atrophie Blanche: No N/A N/A Cyanosis: No Ecchymosis: No Erythema: No Hemosiderin Staining: No Mottled: No Pallor: No Rubor: No Temperature: No Abnormality N/A N/A Tenderness on Palpation: No N/A N/A Wound Preparation: Ulcer Cleansing: N/A N/A Rinsed/Irrigated with Saline Topical Anesthetic Applied: Other: lidocaine 4% Procedures  Performed: Callus Pairing N/A N/A Treatment Notes Electronic Signature(s) Signed: 02/02/2019 4:42:30 PM By: Linton Ham MD Entered By: Linton Ham on 02/02/2019 10:19:44 [name] ([id number]) -------------------------------------------------------------------------------- Multi-Disciplinary Care Plan Details Patient Name: [name] Date of Service: 02/02/2019 9:00 AM Medical Record Number: [id number] Patient Account Number: [id number] Date of Birth/Sex: 1946-02-15 (72 y.o. M) Treating RN: Cornell Barman Primary Care Vanessia Bokhari: Ria Bush Other Clinician: Referring Viet Kemmerer: Ria Bush Treating Duard Spiewak/Extender: Tito Dine in Treatment: 5 Active Inactive Electronic Signature(s) Signed: 02/02/2019 12:04:48 PM By: Gretta Cool, BSN, RN, CWS, Kim RN, BSN Entered By: Gretta Cool, BSN, RN, CWS, Kim on 02/02/2019 12:04:48 [name] ([id number]) -------------------------------------------------------------------------------- Pain Assessment Details Patient Name: [name] Date of Service: 02/02/2019 9:00 AM Medical Record Number: [id number] Patient Account Number: [id number] Date of Birth/Sex: May 31, 1946 (72 y.o. M) Treating RN: Montey Hora Primary Care Dyan Labarbera: Ria Bush Other Clinician: Referring Rozella Servello: Ria Bush Treating Heavan Francom/Extender: Tito Dine in Treatment: 5 Active Problems Location of Pain Severity and Description of Pain Patient Has Paino No Site Locations Pain Management and Medication Current Pain Management: Electronic Signature(s) Signed: 02/02/2019 4:40:34 PM By: Montey Hora Entered By: Montey Hora on 02/02/2019 09:15:19 [name] ([id number]) -------------------------------------------------------------------------------- Patient/Caregiver Education Details Patient Name: [name] Date of Service: 02/02/2019 9:00 AM Medical Record Number: [id number] Patient Account Number:  [id number] Date of Birth/Gender: [birth year] (72 y.o. M) Treating RN: Cornell Barman Primary Care Physician: Ria Bush Other Clinician: Referring Physician: Ria Bush Treating Physician/Extender: Tito Dine in Treatment: 5 Education Assessment Education Provided To: Patient Education Topics Provided Wound/Skin Impairment: Handouts: Caring for Your Ulcer Methods: Demonstration, Explain/Verbal Responses: State content correctly Electronic Signature(s) Signed: 02/02/2019 5:55:39 PM By: Gretta Cool, BSN, RN, CWS, Kim RN, BSN Entered By: Gretta Cool, BSN, RN, CWS, Kim on 02/02/2019 09:39:52 [name] ([id number]) -------------------------------------------------------------------------------- Wound Assessment Details Patient Name: [name] Date of Service: 02/02/2019 9:00 AM Medical Record Number: [id number] Patient Account Number: [id number] Date of Birth/Sex: [birth year] (72 y.o. M) Treating RN: Cornell Barman Primary Care Jamie Hafford: Ria Bush Other Clinician: Referring Kailiana Granquist: Ria Bush Treating Guhan Bruington/Extender: Tito Dine in Treatment: 5 Wound Status Wound Number: 1 Primary Diabetic Wound/Ulcer of the Lower Extremity Etiology: Wound Location: Right, Plantar Toe Great Wound Healed - Epithelialized Wounding Event: Blister Status: Date Acquired: 12/11/2018 Comorbid Sleep Apnea, Arrhythmia, Hypertension, Type II Weeks Of Treatment: 5 History: Diabetes, Osteoarthritis, Neuropathy, Received Clustered Wound: No Radiation Photos Photo Uploaded By: Montey Hora on 02/02/2019 11:52:46 Wound Measurements Length: (cm) 0 % R Width: (cm) 0 % R Depth: (cm) 0 Epi Area: (cm) 0 Tu Volume: (cm) 0 Un eduction in Area: 100% eduction in Volume:  100% thelialization: Large (67-100%) nneling: No dermining: No Wound Description Classification: Grade 2 Fou Wound Margin: Flat and Intact Slo Exudate Amount: None Present l Odor After  Cleansing: No ugh/Fibrino Yes Wound Bed Granulation Amount: None Present (0%) Exposed Structure Necrotic Amount: None Present (0%) Fascia Exposed: No Fat Layer (Subcutaneous Tissue) Exposed: Yes Tendon Exposed: No Muscle Exposed: No Joint Exposed: No Bone Exposed: No Periwound Skin Texture Texture Color No Abnormalities Noted: No No Abnormalities Noted: No THESEUS, BIRNIE ([id number]) Callus: Yes Atrophie Blanche: No Crepitus: No Cyanosis: No Excoriation: No Ecchymosis: No Induration: No Erythema: No Rash: No Hemosiderin Staining: No Scarring: No Mottled: No Pallor: No Moisture Rubor: No No Abnormalities Noted: No Dry / Scaly: No Temperature / Pain Maceration: Yes Temperature: No Abnormality Wound Preparation Ulcer Cleansing: Rinsed/Irrigated with Saline Topical Anesthetic Applied: Other: lidocaine 4%, Electronic Signature(s) Signed: 02/02/2019 5:55:39 PM By: Gretta Cool, BSN, RN, CWS, Kim RN, BSN Entered By: Gretta Cool, BSN, RN, CWS, Kim on 02/02/2019 12:04:25 [name] ([id number]) -------------------------------------------------------------------------------- Vitals Details Patient Name: [name] Date of Service: 02/02/2019 9:00 AM Medical Record Number: [id number] Patient Account Number: [id number] Date of Birth/Sex: 1946/06/12 (72 y.o. M) Treating RN: Montey Hora Primary Care Emilyn Ruble: Ria Bush Other Clinician: Referring Aneka Fagerstrom: Ria Bush Treating Dusty Wagoner/Extender: Tito Dine in Treatment: 5 Vital Signs Time Taken: 09:20 Temperature (F): 97.5 Height (in): 72 Pulse (bpm): 59 Weight (lbs): 224 Respiratory Rate (breaths/min): 18 Body Mass Index (BMI): 30.4 Blood Pressure (mmHg): 99/58 Reference Range: 80 - 120 mg / dl Electronic Signature(s) Signed: 02/02/2019 4:40:34 PM By: Montey Hora Entered By: Montey Hora on 02/02/2019 09:20:21

## 2019-02-04 ENCOUNTER — Ambulatory Visit
Admission: RE | Admit: 2019-02-04 | Discharge: 2019-02-04 | Disposition: A | Discharge status: Home / Self Care | Payer: Medicare Other | Source: Ambulatory Visit | Attending: Radiation Oncology | Admitting: Radiation Oncology

## 2019-02-04 DIAGNOSIS — Z510 Encounter for antineoplastic radiation therapy: Secondary | ICD-10-CM | POA: Diagnosis not present

## 2019-02-04 DIAGNOSIS — C61 Malignant neoplasm of prostate: Secondary | ICD-10-CM | POA: Diagnosis not present

## 2019-02-07 ENCOUNTER — Ambulatory Visit
Admission: RE | Admit: 2019-02-07 | Discharge: 2019-02-07 | Disposition: A | Discharge status: Home / Self Care | Payer: Medicare Other | Source: Ambulatory Visit | Attending: Radiation Oncology | Admitting: Radiation Oncology

## 2019-02-07 DIAGNOSIS — Z510 Encounter for antineoplastic radiation therapy: Secondary | ICD-10-CM | POA: Insufficient documentation

## 2019-02-07 DIAGNOSIS — C61 Malignant neoplasm of prostate: Secondary | ICD-10-CM | POA: Insufficient documentation

## 2019-02-08 ENCOUNTER — Ambulatory Visit
Admission: RE | Admit: 2019-02-08 | Discharge: 2019-02-08 | Disposition: A | Discharge status: Home / Self Care | Payer: Medicare Other | Source: Ambulatory Visit | Attending: Radiation Oncology | Admitting: Radiation Oncology

## 2019-02-08 ENCOUNTER — Encounter: Payer: Self-pay | Admitting: Family Medicine

## 2019-02-08 ENCOUNTER — Ambulatory Visit: Payer: Medicare Other

## 2019-02-08 ENCOUNTER — Ambulatory Visit (INDEPENDENT_AMBULATORY_CARE_PROVIDER_SITE_OTHER)
Admission: RE | Admit: 2019-02-08 | Discharge: 2019-02-08 | Disposition: A | Discharge status: Home / Self Care | Payer: Medicare Other | Source: Ambulatory Visit | Attending: Family Medicine | Admitting: Family Medicine

## 2019-02-08 ENCOUNTER — Ambulatory Visit (INDEPENDENT_AMBULATORY_CARE_PROVIDER_SITE_OTHER): Payer: Medicare Other | Admitting: Family Medicine

## 2019-02-08 VITALS — BP 116/60 | HR 43 | Temp 97.8°F | Ht 67.0 in | Wt 225.0 lb

## 2019-02-08 DIAGNOSIS — G8929 Other chronic pain: Secondary | ICD-10-CM

## 2019-02-08 DIAGNOSIS — C61 Malignant neoplasm of prostate: Secondary | ICD-10-CM | POA: Diagnosis not present

## 2019-02-08 DIAGNOSIS — E1140 Type 2 diabetes mellitus with diabetic neuropathy, unspecified: Secondary | ICD-10-CM | POA: Diagnosis not present

## 2019-02-08 DIAGNOSIS — J449 Chronic obstructive pulmonary disease, unspecified: Secondary | ICD-10-CM | POA: Diagnosis not present

## 2019-02-08 DIAGNOSIS — M25512 Pain in left shoulder: Secondary | ICD-10-CM | POA: Diagnosis not present

## 2019-02-08 DIAGNOSIS — Z510 Encounter for antineoplastic radiation therapy: Secondary | ICD-10-CM | POA: Diagnosis not present

## 2019-02-08 DIAGNOSIS — E1165 Type 2 diabetes mellitus with hyperglycemia: Secondary | ICD-10-CM | POA: Diagnosis not present

## 2019-02-08 DIAGNOSIS — IMO0002 Reserved for concepts with insufficient information to code with codable children: Secondary | ICD-10-CM

## 2019-02-08 DIAGNOSIS — M25511 Pain in right shoulder: Secondary | ICD-10-CM | POA: Diagnosis not present

## 2019-02-08 DIAGNOSIS — R197 Diarrhea, unspecified: Secondary | ICD-10-CM | POA: Diagnosis not present

## 2019-02-08 DIAGNOSIS — G894 Chronic pain syndrome: Secondary | ICD-10-CM | POA: Diagnosis not present

## 2019-02-08 DIAGNOSIS — M19012 Primary osteoarthritis, left shoulder: Secondary | ICD-10-CM | POA: Diagnosis not present

## 2019-02-08 MED ORDER — DIPHENOXYLATE-ATROPINE 2.5-0.025 MG PO TABS: 1.0000 | ORAL_TABLET | Freq: Two times a day (BID) | ORAL | 0 refills | Status: DC | PRN

## 2019-02-08 MED ORDER — METHOCARBAMOL 500 MG PO TABS: 500.0000 mg | ORAL_TABLET | Freq: Two times a day (BID) | ORAL | 1 refills | Status: DC

## 2019-02-08 MED ORDER — ALBUTEROL SULFATE HFA 108 (90 BASE) MCG/ACT IN AERS: 2.0000 | INHALATION_SPRAY | Freq: Four times a day (QID) | RESPIRATORY_TRACT | 3 refills | Status: DC | PRN

## 2019-02-08 NOTE — Assessment & Plan Note (Addendum)
Completing radiation treatment of his prostate cancer. Appreciate onc/uro care.

## 2019-02-08 NOTE — Assessment & Plan Note (Signed)
Continues metformin XR 1500mg  daily, states he tolerates this well.

## 2019-02-08 NOTE — Assessment & Plan Note (Signed)
Albuterol refilled per insurance preference.

## 2019-02-08 NOTE — Assessment & Plan Note (Addendum)
Received L shoulder bursal injection 10/2018 with benefit. Asks about rpt injection today however exam more consistent with biceps tendonitis. Provided with exercises and resistance band, suggested further eval by Dr Lorelei Pont. Pt agrees with plan. Check L shoulder films today.

## 2019-02-08 NOTE — Patient Instructions (Addendum)
Stop zanaflex. Ok to take methocarbamol 500mg  twice daily.  New generic albuterol sent in.  Lomotil refilled.  Left shoulder xray today. I think you have biceps tendonitis. Schedule appointment with Dr Lorelei Pont for evaluation.  Keep physical for May.

## 2019-02-08 NOTE — Assessment & Plan Note (Addendum)
States he tolerates metformin well, but attributes diarrhea to recent radiation treatments for prostate cancer. Imodium ineffective to control symptoms. Will trial short course of lomotil.

## 2019-02-08 NOTE — Assessment & Plan Note (Addendum)
[place] CSRS reviewed.  He feels methocarbamol has been more effective than zanaflex - will refill new muscle relaxant.

## 2019-02-08 NOTE — Progress Notes (Signed)
BP 116/60 (BP Location: Right Arm, Patient Position: Sitting, Cuff Size: Normal)   Pulse (!) 43   Temp 97.8 F (36.6 C) (Oral)   Ht 5\' 7"  (1.702 m)   Wt 225 lb (102.1 kg)   SpO2 94%   BMI 35.24 kg/m    CC: chronic pain f/u visit Subjective:    Patient ID: [name], male    DOB: 1946-10-31, 73 y.o.   MRN: [id number]  HPI: [name] is a 74 y.o. male presenting on 02/08/2019 for Discuss Medication (Needs alternative rx for alberterol, ins does not cover. Also, req rx for methocarbamol) and Shoulder Pain (C/o left shoulder pain radiating through the elbow and down to the hand. )   Requests steroid injection into L shoulder today - ongoing L>R shoulder pain.  Last L shoulder bursa injection late 10/2018 with benefit "stopped the popping". Notes sensitivity of left dorsal hand to even cold breeze. He uses salon pas patches to bilateral shoulders regularly with benefit.   Prostate cancer - completing radiation treatments - has one treatment left. Notes increased diarrhea from XRT. Requests diarrhea medication for this. immodium AD hasn't been effective. Thinks he previously used lomotil with benefit.        Relevant past medical, surgical, family and social history reviewed and updated as indicated. Interim medical history since our last visit reviewed. Allergies and medications reviewed and updated. Outpatient Medications Prior to Visit  Medication Sig Dispense Refill  . amiodarone (PACERONE) 200 MG tablet TAKE 1 TABLET(200 MG) BY MOUTH TWICE DAILY 180 tablet 3  . amLODipine (NORVASC) 5 MG tablet TAKE 1 TABLET(5 MG) BY MOUTH DAILY AS NEEDED 90 tablet 3  . diltiazem (CARDIZEM) 30 MG tablet TAKE 1 TABLET BY MOUTH EVERY DAY AS DIRECTED AS NEEDED FOR FAST HEART RATE 30 tablet 4  . ferrous sulfate 325 (65 FE) MG tablet Take 1 tablet (325 mg total) by mouth daily.    . fish oil-omega-3 fatty acids 1000 MG capsule Take 2 g by mouth daily.     . furosemide (LASIX) 20 MG tablet TAKE 1  TABLET BY MOUTH AS NEEDED FOR FLUID 90 tablet 0  . gabapentin (NEURONTIN) 600 MG tablet Take 1 tablet (600 mg total) by mouth 4 (four) times daily as needed. 120 tablet 6  . HYDROcodone-acetaminophen (NORCO) 10-325 MG tablet Take 1 tablet by mouth every 8 (eight) hours as needed for up to 30 days for severe pain. 90 tablet 0  . metFORMIN (GLUCOPHAGE-XR) 500 MG 24 hr tablet Take 3 tablets (1,500 mg total) by mouth daily with breakfast. 270 tablet 3  . nitroGLYCERIN (NITROLINGUAL) 0.4 MG/SPRAY spray USE 1 SPRAY AS DIRECTED EVERY 5 MINUTES AS NEEDED 4.9 g 1  . omeprazole (PRILOSEC) 40 MG capsule TAKE 1 CAPSULE BY MOUTH EVERY DAY 90 capsule 0  . ondansetron (ZOFRAN) 4 MG tablet Take 1 tablet (4 mg total) by mouth every 8 (eight) hours as needed for nausea or vomiting. 20 tablet 0  . tamsulosin (FLOMAX) 0.4 MG CAPS capsule Take 1 capsule (0.4 mg total) by mouth 2 (two) times daily. 60 capsule 11  . vitamin B-12 (CYANOCOBALAMIN) 1000 MCG tablet Take 1 tablet (1,000 mcg total) by mouth daily.    Marland Kitchen warfarin (COUMADIN) 5 MG tablet TAKE AS DIRECTED BY COUMADIN CLINIC 100 tablet 0  . albuterol (PROVENTIL HFA;VENTOLIN HFA) 108 (90 Base) MCG/ACT inhaler INHALE 2 PUFFS BY MOUTH EVERY 6 HOURS AS NEEDED FOR WHEEZING OR SHORTNESS OF BREATH 17 g  0  . tiZANidine (ZANAFLEX) 4 MG tablet Take 1 tablet (4 mg total) by mouth every 8 (eight) hours as needed for muscle spasms. 250 tablet 1  . metoprolol tartrate (LOPRESSOR) 25 MG tablet Take 1 tablet (25 mg total) by mouth as directed. Once daily at 5 pm 90 tablet 3   No facility-administered medications prior to visit.      Per HPI unless specifically indicated in ROS section below Review of Systems Objective:    BP 116/60 (BP Location: Right Arm, Patient Position: Sitting, Cuff Size: Normal)   Pulse (!) 43   Temp 97.8 F (36.6 C) (Oral)   Ht 5\' 7"  (1.702 m)   Wt 225 lb (102.1 kg)   SpO2 94%   BMI 35.24 kg/m   Wt Readings from Last 3 Encounters:  02/08/19 225  lb (102.1 kg)  12/23/18 229 lb 8 oz (104.1 kg)  12/15/18 223 lb 4 oz (101.3 kg)    Physical Exam Vitals signs and nursing note reviewed.  Constitutional:      Appearance: Normal appearance. He is not ill-appearing.  Musculoskeletal: Normal range of motion.        General: Tenderness present.     Comments: FROM bilaterally R shoulder - painful with speed test L shoulder exam: No deformity of shoulders on inspection. Pain with palpation of shoulder bursa, AC joint, biciptal groove FROM in abduction and forward flexion. pain with testing SITS in ext rotation. Discomfort pain with empty can sign. Neg Speed test. No impingement. No pain with crossover test. No pain with rotation of humeral head in [place] joint.   Neurological:     Mental Status: He is alert.       Results for orders placed or performed in visit on 01/31/19  POCT INR  Result Value Ref Range   INR 2.5 2.0 - 3.0   *Note: Due to a large number of results and/or encounters for the requested time period, some results have not been displayed. A complete set of results can be found in Results Review.   Lab Results  Component Value Date   HGBA1C 8.2 (H) 11/03/2018    Assessment & Plan:   Problem List Items Addressed This Visit    Type 2 diabetes, uncontrolled, with neuropathy ([place])    Continues metformin XR 1500mg  daily, states he tolerates this well.       Prostate cancer [facility])    Completing radiation treatment of his prostate cancer. Appreciate onc/uro care.       Encounter for chronic pain management    [place] CSRS reviewed.  He feels methocarbamol has been more effective than zanaflex - will refill new muscle relaxant.       Diarrhea    States he tolerates metformin well, but attributes diarrhea to recent radiation treatments for prostate cancer. Imodium ineffective to control symptoms. Will trial short course of lomotil.       COPD mixed type ([facility]CC)    Albuterol refilled per insurance preference.       Relevant Medications   albuterol (PROVENTIL HFA;VENTOLIN HFA) 108 (90 Base) MCG/ACT inhaler   Chronic pain syndrome (Chronic)   Chronic left shoulder pain - Primary    Received L shoulder bursal injection 10/2018 with benefit. Asks about rpt injection today however exam more consistent with biceps tendonitis. Provided with exercises and resistance band, suggested further eval by Dr Lorelei Pont. Pt agrees with plan. Check L shoulder films today.        Relevant Medications  methocarbamol (ROBAXIN) 500 MG tablet   Other Relevant Orders   DG Shoulder Left   Bilateral shoulder pain       Meds ordered this encounter  Medications  . diphenoxylate-atropine (LOMOTIL) 2.5-0.025 MG tablet    Sig: Take 1 tablet by mouth 2 (two) times daily as needed.    Dispense:  20 tablet    Refill:  0  . methocarbamol (ROBAXIN) 500 MG tablet    Sig: Take 1 tablet (500 mg total) by mouth 2 (two) times daily.    Dispense:  180 tablet    Refill:  1  . albuterol (PROVENTIL HFA;VENTOLIN HFA) 108 (90 Base) MCG/ACT inhaler    Sig: Inhale 2 puffs into the lungs every 6 (six) hours as needed for wheezing or shortness of breath.    Dispense:  1 Inhaler    Refill:  3    May dispense most affordable/efficent formulary for patient.   Orders Placed This Encounter  Procedures  . DG Shoulder Left    Standing Status:   Future    Number of Occurrences:   1    Standing Expiration Date:   04/09/2020    Order Specific Question:   Reason for Exam (SYMPTOM  OR DIAGNOSIS REQUIRED)    Answer:   chronic L shoulder pain    Order Specific Question:   Preferred imaging location?    Answer:   [facility]    Order Specific Question:   Radiology Contrast Protocol - do NOT remove file path    Answer:   [email or web address]    Follow up plan: No follow-ups on file.  Ria Bush, MD

## 2019-02-09 ENCOUNTER — Ambulatory Visit
Admission: RE | Admit: 2019-02-09 | Discharge: 2019-02-09 | Disposition: A | Discharge status: Home / Self Care | Payer: Medicare Other | Source: Ambulatory Visit | Attending: Radiation Oncology | Admitting: Radiation Oncology

## 2019-02-09 DIAGNOSIS — Z510 Encounter for antineoplastic radiation therapy: Secondary | ICD-10-CM | POA: Diagnosis not present

## 2019-02-09 DIAGNOSIS — C61 Malignant neoplasm of prostate: Secondary | ICD-10-CM | POA: Diagnosis not present

## 2019-02-11 ENCOUNTER — Other Ambulatory Visit: Payer: Self-pay | Admitting: Family Medicine

## 2019-02-14 NOTE — Progress Notes (Signed)
[name], [name] ([id number]) Visit Report for 01/05/2019 Arrival Information Details Patient Name: [name], [name] Date of Service: 01/05/2019 9:15 AM Medical Record Number: [id number] Patient Account Number: [id number] Date of Birth/Sex: 07/24/1946 (72 y.o. M) Treating RN: [name] Primary Care Daily Crate: [name] Other Clinician: Referring [name]: [name] Treating Cortne Amara/Extender: [name] in Treatment: 1 Visit Information History Since Last Visit Added or deleted any medications: No Patient Arrived: Cane Any new allergies or adverse reactions: No Arrival Time: 09:20 Had a fall or experienced change in No Accompanied By: self activities of daily living that may affect Transfer Assistance: None risk of falls: Patient Identification Verified: Yes Signs or symptoms of abuse/neglect since last visito No Secondary Verification Process Yes Hospitalized since last visit: No Completed: Implantable device outside of the clinic excluding No Patient Has Alerts: Yes cellular tissue based products placed in the center Patient Alerts: Patient on Blood since last visit: Thinner Has Dressing in Place as Prescribed: Yes Warfarin Pain Present Now: No DMII Electronic Signature(s) Signed: 01/05/2019 11:46:38 AM By: [name] RCP, RRT, CHT Entered By: [name] on 01/05/2019 09:20:54 [name] ([id number]) -------------------------------------------------------------------------------- Clinic Level of Care Assessment Details Patient Name: [name] Date of Service: 01/05/2019 9:15 AM Medical Record Number: [id number] Patient Account Number: [id number] Date of Birth/Sex: March 23, 1946 (72 y.o. M) Treating RN: [name] Primary Care [name]: [name] Other Clinician: Referring [name]: [name] Treating [name]/Extender: [name] in Treatment: 1 Clinic Level of Care  Assessment Items TOOL 4 Quantity Score []  - Use when only an EandM is performed on FOLLOW-UP visit 0 ASSESSMENTS - Nursing Assessment / Reassessment []  - Reassessment of Co-morbidities (includes updates in patient status) 0 X- 1 5 Reassessment of Adherence to Treatment Plan ASSESSMENTS - Wound and Skin Assessment / Reassessment X - Simple Wound Assessment / Reassessment - one wound 1 5 []  - 0 Complex Wound Assessment / Reassessment - multiple wounds []  - 0 Dermatologic / Skin Assessment (not related to wound area) ASSESSMENTS - Focused Assessment []  - Circumferential Edema Measurements - multi extremities 0 []  - 0 Nutritional Assessment / Counseling / Intervention []  - 0 Lower Extremity Assessment (monofilament, tuning fork, pulses) []  - 0 Peripheral Arterial Disease Assessment (using hand held doppler) ASSESSMENTS - Ostomy and/or Continence Assessment and Care []  - Incontinence Assessment and Management 0 []  - 0 Ostomy Care Assessment and Management (repouching, etc.) PROCESS - Coordination of Care X - Simple Patient / Family Education for ongoing care 1 15 []  - 0 Complex (extensive) Patient / Family Education for ongoing care X- 1 10 Staff obtains Programmer, systems, Records, Test Results / Process Orders []  - 0 Staff telephones HHA, Nursing Homes / Clarify orders / etc []  - 0 Routine Transfer to another Facility (non-emergent condition) []  - 0 Routine Hospital Admission (non-emergent condition) []  - 0 New Admissions / Biomedical engineer / Ordering NPWT, Apligraf, etc. []  - 0 Emergency Hospital Admission (emergent condition) X- 1 10 Simple Discharge Coordination [name], [name] ([id number]) []  - 0 Complex (extensive) Discharge Coordination PROCESS - Special Needs []  - Pediatric / Minor Patient Management 0 []  - 0 Isolation Patient Management []  - 0 Hearing / Language / Visual special needs []  - 0 Assessment of Community assistance (transportation, D/C planning,  etc.) []  - 0 Additional assistance / Altered mentation []  - 0 Support Surface(s) Assessment (bed, cushion, seat, etc.) INTERVENTIONS - Wound Cleansing / Measurement X - Simple Wound Cleansing - one wound  1 5 []  - 0 Complex Wound Cleansing - multiple wounds X- 1 5 Wound Imaging (photographs - any number of wounds) []  - 0 Wound Tracing (instead of photographs) X- 1 5 Simple Wound Measurement - one wound []  - 0 Complex Wound Measurement - multiple wounds INTERVENTIONS - Wound Dressings X - Small Wound Dressing one or multiple wounds 1 10 []  - 0 Medium Wound Dressing one or multiple wounds []  - 0 Large Wound Dressing one or multiple wounds []  - 0 Application of Medications - topical []  - 0 Application of Medications - injection INTERVENTIONS - Miscellaneous []  - External ear exam 0 []  - 0 Specimen Collection (cultures, biopsies, blood, body fluids, etc.) []  - 0 Specimen(s) / Culture(s) sent or taken to Lab for analysis []  - 0 Patient Transfer (multiple staff / Civil Service fast streamer / Similar devices) []  - 0 Simple Staple / Suture removal (25 or less) []  - 0 Complex Staple / Suture removal (26 or more) []  - 0 Hypo / Hyperglycemic Management (close monitor of Blood Glucose) []  - 0 Ankle / Brachial Index (ABI) - do not check if billed separately X- 1 5 Vital Signs [name], [name] [name]. ([id number]) Has the patient been seen at the hospital within the last three years: Yes Total Score: 75 Level Of Care: New/Established - Level 2 Electronic Signature(s) Signed: 01/05/2019 6:00:27 PM By: [name], BSN, RN, CWS, Kim RN, BSN Entered By: [name], BSN, RN, CWS, Kim on 01/05/2019 10:32:44 [name] ([id number]) -------------------------------------------------------------------------------- Encounter Discharge Information Details Patient Name: [name] Date of Service: 01/05/2019 9:15 AM Medical Record Number: [id number] Patient Account Number: [id number] Date of Birth/Sex: 03-Jan-1946 (72  y.o. M) Treating RN: [name] Primary Care Jabin Tapp: [name] Other Clinician: Referring Joyceline Maiorino: [name] Treating Kamilo Och/Extender: [name] in Treatment: 1 Encounter Discharge Information Items Discharge Condition: Stable Ambulatory Status: Ambulatory Discharge Destination: Home Transportation: Private Auto Accompanied By: self Schedule Follow-up Appointment: Yes Clinical Summary of Care: Electronic Signature(s) Signed: 01/05/2019 11:19:33 AM By: [name] Entered By: [name] on 01/05/2019 11:19:33 [name] ([id number]) -------------------------------------------------------------------------------- Lower Extremity Assessment Details Patient Name: [name] Date of Service: 01/05/2019 9:15 AM Medical Record Number: [id number] Patient Account Number: [id number] Date of Birth/Sex: 04-28-1946 (72 y.o. M) Treating RN: Harold Barban Primary Care Drago Hammonds: [name] Other Clinician: Referring Chetara Kropp: [name] Treating Azadeh Hyder/Extender: [name] in Treatment: 1 Edema Assessment Assessed: [Left: No] [Right: No] [Left: Edema] [Right: :] Calf Left: Right: Point of Measurement: 34 cm From Medial Instep cm 36.5 cm Ankle Left: Right: Point of Measurement: 12 cm From Medial Instep cm 24.1 cm Vascular Assessment Claudication: Claudication Assessment [Right:None] Pulses: Dorsalis Pedis Palpable: [Right:Yes] Posterior Tibial Palpable: [Right:Yes] Extremity colors, hair growth, and conditions: Extremity Color: [Right:Normal] Hair Growth on Extremity: [Right:Yes] Temperature of Extremity: [Right:Warm] Capillary Refill: [Right:< 3 seconds] Toe Nail Assessment Left: Right: Thick: No Discolored: No Deformed: No Improper Length and Hygiene: No Electronic Signature(s) Signed: 02/14/2019 10:53:34 AM By: Harold Barban Entered By: Harold Barban on 01/05/2019 09:43:09 [name]  ([id number]) -------------------------------------------------------------------------------- Multi Wound Chart Details Patient Name: [name] Date of Service: 01/05/2019 9:15 AM Medical Record Number: [id number] Patient Account Number: [id number] Date of Birth/Sex: [birth year] (73 y.o. M) Treating RN: [name] Primary Care Novak Stgermaine: [name] Other Clinician: Referring Elyse Prevo: [name] Treating Jalisha Enneking/Extender: [name] in Treatment: 1 Vital Signs Height(in): 72 Pulse(bpm): 51 Weight(lbs): 767 Blood Pressure(mmHg): 111/54 Body Mass Index(BMI): 30 Temperature(F): 97.6 Respiratory  Rate 16 (breaths/min): Photos: [1:No Photos] [N/A:N/A] Wound Location: [1:Right Toe Great - Plantar] [N/A:N/A] Wounding Event: [1:Blister] [N/A:N/A] Primary Etiology: [1:Diabetic Wound/Ulcer of the Lower Extremity] [N/A:N/A] Comorbid History: [1:Sleep Apnea, Arrhythmia, Hypertension, Type II Diabetes, Osteoarthritis, Neuropathy, Received Radiation] [N/A:N/A] Date Acquired: [1:12/11/2018] [N/A:N/A] Weeks of Treatment: [1:1] [N/A:N/A] Wound Status: [1:Open] [N/A:N/A] Measurements L x W x D [1:2x1x0.1] [N/A:N/A] (cm) Area (cm) : [1:1.571] [N/A:N/A] Volume (cm) : [1:0.157] [N/A:N/A] % Reduction in Area: [1:0.00%] [N/A:N/A] % Reduction in Volume: [1:0.00%] [N/A:N/A] Classification: [1:Grade 2] [N/A:N/A] Exudate Amount: [1:Medium] [N/A:N/A] Exudate Type: [1:Serous] [N/A:N/A] Exudate Color: [1:amber] [N/A:N/A] Wound Margin: [1:Flat and Intact] [N/A:N/A] Granulation Amount: [1:Medium (34-66%)] [N/A:N/A] Granulation Quality: [1:Pink] [N/A:N/A] Necrotic Amount: [1:Medium (34-66%)] [N/A:N/A] Exposed Structures: [1:Fat Layer (Subcutaneous Tissue) Exposed: Yes Fascia: No Tendon: No Muscle: No Joint: No Bone: No] [N/A:N/A] Epithelialization: [1:None] [N/A:N/A] Periwound Skin Texture: [1:Callus: Yes Excoriation: No] [N/A:N/A] Induration: No Crepitus: No Rash:  No Scarring: No Periwound Skin Moisture: Maceration: Yes N/A N/A Dry/Scaly: No Periwound Skin Color: Atrophie Blanche: No N/A N/A Cyanosis: No Ecchymosis: No Erythema: No Hemosiderin Staining: No Mottled: No Pallor: No Rubor: No Temperature: No Abnormality N/A N/A Tenderness on Palpation: Yes N/A N/A Wound Preparation: Ulcer Cleansing: N/A N/A Rinsed/Irrigated with Saline Topical Anesthetic Applied: Other: lidocaine 4% Treatment Notes Wound #1 (Right, Plantar Toe Great) Notes prisma, foam, conform and tape - patient given a darco shoe with peg assist Electronic Signature(s) Signed: 01/06/2019 9:49:40 AM By: Linton Ham MD Entered By: Linton Ham on 01/05/2019 11:23:33 [name] ([id number]) -------------------------------------------------------------------------------- [place] Details Patient Name: [name] Date of Service: 01/05/2019 9:15 AM Medical Record Number: [id number] Patient Account Number: [id number] Date of Birth/Sex: 07/15/1946 (72 y.o. M) Treating RN: [name] Primary Care Messina Kosinski: [name] Other Clinician: Referring Lilah Mijangos: [name] Treating Muzammil Bruins/Extender: [name] in Treatment: 1 Active Inactive Orientation to the Wound Care Program Nursing Diagnoses: Knowledge deficit related to the wound healing center program Goals: Patient/caregiver Jim verbalize understanding of the [facility] Program Date Initiated: 12/29/2018 Target Resolution Date: 01/05/2019 Goal Status: Active Interventions: Provide education on orientation to the wound center Notes: Peripheral Neuropathy Nursing Diagnoses: Knowledge deficit related to disease process and management of peripheral neurovascular dysfunction Goals: Patient/caregiver Jim verbalize understanding of disease process and disease management Date Initiated: 12/29/2018 Target Resolution Date: 01/05/2019 Goal Status:  Active Interventions: Assess signs and symptoms of neuropathy upon admission and as needed Notes: Pressure Nursing Diagnoses: Knowledge deficit related to management of pressures ulcers Potential for impaired tissue integrity related to pressure, friction, moisture, and shear Goals: Patient/caregiver Jim verbalize understanding of pressure ulcer management Date Initiated: 12/29/2018 Target Resolution Date: 01/05/2019 Goal Status: Active Interventions: [name], [name] ([id number]) Assess: immobility, friction, shearing, incontinence upon admission and as needed Assess offloading mechanisms upon admission and as needed Notes: Wound/Skin Impairment Nursing Diagnoses: Impaired tissue integrity Goals: Ulcer/skin breakdown Jim have a volume reduction of 30% by week 4 Date Initiated: 12/29/2018 Target Resolution Date: 01/29/2019 Goal Status: Active Interventions: Provide education on ulcer and skin care Treatment Activities: Referred to DME Angy Swearengin for dressing supplies : 12/29/2018 Notes: Electronic Signature(s) Signed: 01/05/2019 6:00:27 PM By: [name], BSN, RN, CWS, Kim RN, BSN Entered By: [name], BSN, RN, CWS, Kim on 01/05/2019 10:30:22 [name] ([id number]) -------------------------------------------------------------------------------- Pain Assessment Details Patient Name: [name] Date of Service: 01/05/2019 9:15 AM Medical Record Number: [id number] Patient Account Number: [id number] Date of Birth/Sex: 31-Oct-1946 (73 y.o. M) Treating RN: [name] Primary Care Josias Tomerlin:  [name] Other Clinician: Referring Dillyn Joaquin: [name] Treating Dathan Attia/Extender: [name] in Treatment: 1 Active Problems Location of Pain Severity and Description of Pain Patient Has Paino No Site Locations Pain Management and Medication Current Pain Management: Electronic Signature(s) Signed: 01/05/2019 11:46:38 AM By: [name] RCP, RRT,  CHT Signed: 01/05/2019 6:00:27 PM By: [name], BSN, RN, CWS, Kim RN, BSN Entered By: [name] on 01/05/2019 09:21:01 [name] ([id number]) -------------------------------------------------------------------------------- Patient/Caregiver Education Details Patient Name: [name] Date of Service: 01/05/2019 9:15 AM Medical Record Number: [id number] Patient Account Number: [id number] Date of Birth/Gender: 1946/05/28 (72 y.o. M) Treating RN: [name] Primary Care Physician: [name] Other Clinician: Referring Physician: Ria Moore Treating Physician/Extender: [name] in Treatment: 1 Education Assessment Education Provided To: Patient Education Topics Provided Wound/Skin Impairment: Handouts: Caring for Your Ulcer Methods: Demonstration Responses: State content correctly Electronic Signature(s) Signed: 01/05/2019 6:00:27 PM By: [name], BSN, RN, CWS, Kim RN, BSN Entered By: [name], BSN, RN, CWS, Kim on 01/05/2019 10:33:03 [name] ([id number]) -------------------------------------------------------------------------------- Wound Assessment Details Patient Name: [name] Date of Service: 01/05/2019 9:15 AM Medical Record Number: [id number] Patient Account Number: [id number] Date of Birth/Sex: Aug 18, 1946 (72 y.o. M) Treating RN: Harold Barban Primary Care Penny Frisbie: [name] Other Clinician: Referring Harlon Kutner: [name] Treating Tyah Acord/Extender: [name] in Treatment: 1 Wound Status Wound Number: 1 Primary Diabetic Wound/Ulcer of the Lower Extremity Etiology: Wound Location: Right Toe Great - Plantar Wound Open Wounding Event: Blister Status: Date Acquired: 12/11/2018 Comorbid Sleep Apnea, Arrhythmia, Hypertension, Type II Weeks Of Treatment: 1 History: Diabetes, Osteoarthritis, Neuropathy, Received Clustered Wound: No Radiation Photos Photo Uploaded By: Harold Barban on  01/05/2019 17:31:22 Wound Measurements Length: (cm) 2 Width: (cm) 1 Depth: (cm) 0.1 Area: (cm) 1.571 Volume: (cm) 0.157 % Reduction in Area: 0% % Reduction in Volume: 0% Epithelialization: None Tunneling: No Undermining: No Wound Description Classification: Grade 2 Wound Margin: Flat and Intact Exudate Amount: Medium Exudate Type: Serous Exudate Color: amber Foul Odor After Cleansing: No Slough/Fibrino Yes Wound Bed Granulation Amount: Medium (34-66%) Exposed Structure Granulation Quality: Pink Fascia Exposed: No Necrotic Amount: Medium (34-66%) Fat Layer (Subcutaneous Tissue) Exposed: Yes Necrotic Quality: Adherent Slough Tendon Exposed: No Muscle Exposed: No Joint Exposed: No Bone Exposed: No Periwound Skin Texture [name], Jim HEINKEL. ([id number]) Texture Color No Abnormalities Noted: No No Abnormalities Noted: No Callus: Yes Atrophie Blanche: No Crepitus: No Cyanosis: No Excoriation: No Ecchymosis: No Induration: No Erythema: No Rash: No Hemosiderin Staining: No Scarring: No Mottled: No Pallor: No Moisture Rubor: No No Abnormalities Noted: No Dry / Scaly: No Temperature / Pain Maceration: Yes Temperature: No Abnormality Tenderness on Palpation: Yes Wound Preparation Ulcer Cleansing: Rinsed/Irrigated with Saline Topical Anesthetic Applied: Other: lidocaine 4%, Electronic Signature(s) Signed: 02/14/2019 10:53:34 AM By: Harold Barban Entered By: Harold Barban on 01/05/2019 09:42:17 [name] ([id number]) -------------------------------------------------------------------------------- Vitals Details Patient Name: [name] Date of Service: 01/05/2019 9:15 AM Medical Record Number: [id number] Patient Account Number: [id number] Date of Birth/Sex: [birth year] (73 y.o. M) Treating RN: [name] Primary Care Whitnie Deleon: [name] Other Clinician: Referring Mairi Stagliano: [name] Treating Vallie Teters/Extender: [name]  in Treatment: 1 Vital Signs Time Taken: 09:21 Temperature (F): 97.6 Height (in): 72 Pulse (bpm): 51 Weight (lbs): 224 Respiratory Rate (breaths/min): 16 Body Mass Index (BMI): 30.4 Blood Pressure (mmHg): 111/54 Reference Range: 80 - 120 mg / dl Electronic Signature(s) Signed: 01/05/2019 11:46:38 AM By: [name] RCP, RRT, CHT  Entered By: [name] on 01/05/2019 09:23:50

## 2019-02-22 ENCOUNTER — Other Ambulatory Visit: Payer: Self-pay | Admitting: Family Medicine

## 2019-02-22 DIAGNOSIS — G894 Chronic pain syndrome: Secondary | ICD-10-CM

## 2019-02-22 NOTE — Telephone Encounter (Signed)
Last office visit 02/08/2019 for Chronic Left Shoulder Pain.  Last refilled Norco 02/01/2019 for #90 with no refills.  Lomotil 02/08/2019 for #20 with no refills.  UDS/Contract 07/06/2017.  Next Appt: 04/21/2019 for CPE.

## 2019-02-23 MED ORDER — DIPHENOXYLATE-ATROPINE 2.5-0.025 MG PO TABS: 1.0000 | ORAL_TABLET | Freq: Two times a day (BID) | ORAL | 0 refills | Status: DC | PRN

## 2019-02-23 MED ORDER — HYDROCODONE-ACETAMINOPHEN 10-325 MG PO TABS: 1.0000 | ORAL_TABLET | Freq: Three times a day (TID) | ORAL | 0 refills | Status: DC | PRN

## 2019-02-23 NOTE — Telephone Encounter (Signed)
Eprescribed.

## 2019-02-28 ENCOUNTER — Telehealth: Payer: Self-pay | Admitting: Family Medicine

## 2019-02-28 NOTE — Telephone Encounter (Signed)
Left message on vm per dpr for pt's wife, [name] (on dpr), asking her to call back to get better understanding of what the pt is requesting.

## 2019-02-28 NOTE — Telephone Encounter (Signed)
Per pt's wife calling pt wish to change the direction of his metformin 500 mg prescription. Pt wish to take 2-3 am and 2 in pm due to his cholesterol is elevated. Please call pt to advise if this can be done.    Sent to [employer]

## 2019-03-01 NOTE — Telephone Encounter (Signed)
Left message on vm per dpr for pt's wife, [name] (on dpr), asking her to call back to get better understanding of what the pt is requesting, since pt is taking metformin for diabetes.

## 2019-03-02 ENCOUNTER — Telehealth: Payer: Self-pay

## 2019-03-02 MED ORDER — METFORMIN HCL ER 500 MG PO TB24: 1000.0000 mg | ORAL_TABLET | Freq: Two times a day (BID) | ORAL | 3 refills | Status: DC

## 2019-03-02 NOTE — Telephone Encounter (Signed)
Ok to take 2 metformin in am and 2 in pm - but that is max he should take. Refilled to pharmacy with higher #.

## 2019-03-02 NOTE — Telephone Encounter (Signed)
Spoke with pt's wife, [name] (on dpr), to get clarification of message.  She states pt's BS is a little elevated later in the day (did not have any specific readings).  So pt has been taking an extra 1-2 tabs in the evening.  [name] says the metformin XR is really helping but she is asking can pt take 2 tabs in AM and 2 tabs in PM vs 3 tabs in AM with breakfast.  Pls advise. [name] gives permission to lvm.)

## 2019-03-02 NOTE — Addendum Note (Signed)
Addended by: Ria Bush on: 03/02/2019 09:40 AM   Modules accepted: Orders

## 2019-03-02 NOTE — Telephone Encounter (Signed)
TELEPHONE CALL NOTE  [name] has been deemed a candidate for a follow-up tele-health visit to limit community exposure during the Covid-19 pandemic. I spoke with the patient via phone to ensure availability of phone/video source, confirm preferred email & phone number, discuss instructions and expectations, and review consent.   I reminded [name] to be prepared with any vital sign and/or heart rhythm information that could potentially be obtained via home monitoring, at the time of his visit.  Finally, I reminded [name] to expect an e-mail containing a link for their video-based visit approximately 15 minutes before his visit, or alternatively, a phone call at the time of his visit if his visit is planned to be a phone encounter.  Did the patient verbally consent to treatment as below? [place], [place] 03/02/2019 12:04 PM   CONSENT FOR TELE-HEALTH VISIT - PLEASE REVIEW  I hereby voluntarily request, consent and authorize [facility] and its employed or contracted physicians, physician assistants, nurse practitioners or other licensed health care professionals (the Practitioner), to provide me with telemedicine health care services (the "Services") as deemed necessary by the treating Practitioner. I acknowledge and consent to receive the Services by the Practitioner via telemedicine. I understand that the telemedicine visit will involve communicating with the Practitioner through live audiovisual communication technology and the disclosure of certain medical information by electronic transmission. I acknowledge that I have been given the opportunity to request an in-person assessment or other available alternative prior to the telemedicine visit and am voluntarily participating in the telemedicine visit.  I understand that I have the right to withhold or withdraw my consent to the use of telemedicine in the course of my care at any time, without affecting my right  to future care or treatment, and that the Practitioner or I may terminate the telemedicine visit at any time. I understand that I have the right to inspect all information obtained and/or recorded in the course of the telemedicine visit and may receive copies of available information for a reasonable fee.  I understand that some of the potential risks of receiving the Services via telemedicine include:  Marland Kitchen Delay or interruption in medical evaluation due to technological equipment failure or disruption; . Information transmitted may not be sufficient (e.g. poor resolution of images) to allow for appropriate medical decision making by the Practitioner; and/or  . In rare instances, security protocols could fail, causing a breach of personal health information.  Furthermore, I acknowledge that it is my responsibility to provide information about my medical history, conditions and care that is complete and accurate to the best of my ability. I acknowledge that Practitioner's advice, recommendations, and/or decision may be based on factors not within their control, such as incomplete or inaccurate data provided by me or distortions of diagnostic images or specimens that may result from electronic transmissions. I understand that the practice of medicine is not an exact science and that Practitioner makes no warranties or guarantees regarding treatment outcomes. I acknowledge that I will receive a copy of this consent concurrently upon execution via email to the email address I last provided but may also request a printed copy by calling the office of [place].    I understand that my insurance will be billed for this visit.   I have read or had this consent read to me. . I understand the contents of this consent, which adequately explains the benefits and risks of the Services  being provided via telemedicine.  . I have been provided ample opportunity to ask questions regarding this consent and the Services  and have had my questions answered to my satisfaction. . I give my informed consent for the services to be provided through the use of telemedicine in my medical care  By participating in this telemedicine visit I agree to the above.

## 2019-03-02 NOTE — Telephone Encounter (Signed)
Spoke with pt's wife, [name], relaying Dr. Synthia Innocent message and new directions. Verbalizes understanding and expresses her thanks.

## 2019-03-04 ENCOUNTER — Telehealth: Payer: Medicare Other | Admitting: Cardiovascular Disease

## 2019-03-07 ENCOUNTER — Ambulatory Visit (INDEPENDENT_AMBULATORY_CARE_PROVIDER_SITE_OTHER): Payer: Medicare Other

## 2019-03-07 ENCOUNTER — Other Ambulatory Visit: Payer: Self-pay

## 2019-03-07 DIAGNOSIS — I4891 Unspecified atrial fibrillation: Secondary | ICD-10-CM | POA: Diagnosis not present

## 2019-03-07 DIAGNOSIS — Z5181 Encounter for therapeutic drug level monitoring: Secondary | ICD-10-CM | POA: Diagnosis not present

## 2019-03-07 LAB — POCT INR: INR: 1.7 — AB (ref 2.0–3.0)

## 2019-03-07 NOTE — Patient Instructions (Signed)
Please extra 1/2 tablet tonight and tomorrow, then continue dosage of 1 tablet every day except 1/2 tablet on Mondays, Wednesdays and Fridays.  Recheck INR in 4 weeks.

## 2019-03-09 ENCOUNTER — Other Ambulatory Visit: Payer: Self-pay

## 2019-03-09 ENCOUNTER — Telehealth: Payer: Medicare Other | Admitting: Cardiovascular Disease

## 2019-03-09 ENCOUNTER — Telehealth: Payer: Self-pay | Admitting: Family Medicine

## 2019-03-09 NOTE — Telephone Encounter (Signed)
Copied from [place] [phone]. Topic: Quick Communication - See Telephone Encounter >> Mar 09, 2019  5:00 PM Vernona Rieger wrote: CRM for notification. See Telephone encounter for: 03/09/19.  Patient's wife called and said he has a lot of mucus when he coughs. No fever/ or chills. She would like the nurse to call her [phone]

## 2019-03-10 ENCOUNTER — Other Ambulatory Visit: Payer: Self-pay

## 2019-03-10 ENCOUNTER — Encounter: Payer: Self-pay | Admitting: Family Medicine

## 2019-03-10 ENCOUNTER — Ambulatory Visit (INDEPENDENT_AMBULATORY_CARE_PROVIDER_SITE_OTHER): Payer: Medicare Other | Admitting: Family Medicine

## 2019-03-10 VITALS — BP 155/71 | HR 47 | Temp 97.6°F | Ht 67.0 in | Wt 221.0 lb

## 2019-03-10 DIAGNOSIS — J449 Chronic obstructive pulmonary disease, unspecified: Secondary | ICD-10-CM

## 2019-03-10 DIAGNOSIS — Z720 Tobacco use: Secondary | ICD-10-CM

## 2019-03-10 MED ORDER — GUAIFENESIN 400 MG PO TABS: 400.0000 mg | ORAL_TABLET | Freq: Four times a day (QID) | ORAL | 1 refills | Status: DC | PRN

## 2019-03-10 MED ORDER — TIOTROPIUM BROMIDE MONOHYDRATE 18 MCG IN CAPS: 18.0000 ug | ORAL_CAPSULE | Freq: Every day | RESPIRATORY_TRACT | 6 refills | Status: DC

## 2019-03-10 NOTE — Telephone Encounter (Signed)
I spoke with pts wife; pt started prod cough with a lot of yellow thick phlegm 1 wk ago,No SOB,no fever; 98 temp. No traveling and no known exposure to corona or flu.  [name](DPR signed) wanted to do a doxy.me visit so would not have to do download of app. Pt scheduled doxy.me 03/10/19 at 11:30.

## 2019-03-10 NOTE — Assessment & Plan Note (Addendum)
Anticipate chronic mucous production from known COPD. No signs of Covid or bacterial infection or active COPD exacerbation.  Supportive care reviewed. rec start plain mucinex or fast relief guaifenesin. rec re-trial spiriva daily.  Discussed albuterol rescue inhaler and not to use scheduled but rather PRN dyspnea, wheeze, cough.  Recommend spirometry and ambulatory pulse ox test at next office visit - encouraged he return in 2 months for this.  Encouraged smoking cessation, discussed relation of smoking to lung function and sputum production.

## 2019-03-10 NOTE — Assessment & Plan Note (Addendum)
Continues 1 ppd. Encouraged smoking cessation, discussed relation of smoking to lung function and sputum production.  Today he seemed more contemplative than any previous time we've discussed.

## 2019-03-10 NOTE — Telephone Encounter (Signed)
Will see pt then.

## 2019-03-10 NOTE — Progress Notes (Signed)
Virtual visit attempted through WebEx, pt did not know how to download the app. Visit completed through Doxy.Me.  Patient location: home, wife [name] is also on the call Provider location:  at [facility], office If any vitals were documented below, they were collected by patient at home unless otherwise specified.    BP (!) 155/71 (BP Location: Right Arm, Patient Position: Sitting, Cuff Size: Normal)   Pulse (!) 47   Temp 97.6 F (36.4 C) (Oral)   Ht 5\' 7"  (1.702 m)   Wt 221 lb (100.2 kg)   BMI 34.61 kg/m    CC: "I have a lot of phlegm" Subjective:    Patient ID: [name], male    DOB: July 17, 1946, 73 y.o.   MRN: [id number]  HPI: [name] is a 73 y.o. male presenting on 03/10/2019 for Cough (C/o productive cough with yellow phlegm. Denies any fever or SOB. Sxs started about 1 wk ago. )   Main concern today is significant amount of phlegm production - this is chronic. Thick yellow mucous. Overall feels well.  Denies fevers, chills, change in chronic dyspnea.  No recent travel or known exposure to [phone].   Known COPD on albuterol PRN (takes this every night, doesn't feel it is helpful), not on any daily respiratory medications. Previously tried spiriva and advair or symbicort - they were very expensive. Continued 1 ppd smoker. No recent abx use. Last received augmentin for diabetic foot wound 12/2018.   Prostate cancer - completed radiation treatments earlier this month. Some ongoing fatigue from this.   DM - taking metformin XR 500mg  2 tab bid, tolerating well.  Lab Results  Component Value Date   HGBA1C 8.2 (H) 11/03/2018        Relevant past medical, surgical, family and social history reviewed and updated as indicated. Interim medical history since our last visit reviewed. Allergies and medications reviewed and updated. Outpatient Medications Prior to Visit  Medication Sig Dispense Refill  . albuterol (PROVENTIL HFA;VENTOLIN HFA) 108 (90 Base) MCG/ACT  inhaler Inhale 2 puffs into the lungs every 6 (six) hours as needed for wheezing or shortness of breath. 1 Inhaler 3  . amiodarone (PACERONE) 200 MG tablet TAKE 1 TABLET(200 MG) BY MOUTH TWICE DAILY 180 tablet 3  . amLODipine (NORVASC) 5 MG tablet TAKE 1 TABLET(5 MG) BY MOUTH DAILY AS NEEDED 90 tablet 3  . diltiazem (CARDIZEM) 30 MG tablet TAKE 1 TABLET BY MOUTH EVERY DAY AS DIRECTED AS NEEDED FOR FAST HEART RATE 30 tablet 4  . diphenoxylate-atropine (LOMOTIL) 2.5-0.025 MG tablet Take 1 tablet by mouth 2 (two) times daily as needed. 20 tablet 0  . ferrous sulfate 325 (65 FE) MG tablet Take 1 tablet (325 mg total) by mouth daily.    . fish oil-omega-3 fatty acids 1000 MG capsule Take 2 g by mouth daily.     . furosemide (LASIX) 20 MG tablet TAKE 1 TABLET BY MOUTH AS NEEDED FOR FLUID 90 tablet 0  . gabapentin (NEURONTIN) 600 MG tablet Take 1 tablet (600 mg total) by mouth 4 (four) times daily as needed. 120 tablet 6  . HYDROcodone-acetaminophen (NORCO) 10-325 MG tablet Take 1 tablet by mouth every 8 (eight) hours as needed for up to 30 days for severe pain. 90 tablet 0  . metFORMIN (GLUCOPHAGE-XR) 500 MG 24 hr tablet Take 2 tablets (1,000 mg total) by mouth 2 (two) times daily. 360 tablet 3  . methocarbamol (ROBAXIN) 500 MG tablet Take 1 tablet (500  mg total) by mouth 2 (two) times daily. 180 tablet 1  . nitroGLYCERIN (NITROLINGUAL) 0.4 MG/SPRAY spray USE 1 SPRAY AS DIRECTED EVERY 5 MINUTES AS NEEDED 4.9 g 1  . omeprazole (PRILOSEC) 40 MG capsule TAKE 1 CAPSULE BY MOUTH EVERY DAY 90 capsule 0  . ondansetron (ZOFRAN) 4 MG tablet Take 1 tablet (4 mg total) by mouth every 8 (eight) hours as needed for nausea or vomiting. 20 tablet 0  . tamsulosin (FLOMAX) 0.4 MG CAPS capsule Take 1 capsule (0.4 mg total) by mouth 2 (two) times daily. 60 capsule 11  . vitamin B-12 (CYANOCOBALAMIN) 1000 MCG tablet Take 1 tablet (1,000 mcg total) by mouth daily.    Marland Kitchen warfarin (COUMADIN) 5 MG tablet TAKE AS DIRECTED BY  COUMADIN CLINIC 100 tablet 0  . metoprolol tartrate (LOPRESSOR) 25 MG tablet Take 1 tablet (25 mg total) by mouth as directed. Once daily at 5 pm 90 tablet 3   No facility-administered medications prior to visit.      Per HPI unless specifically indicated in ROS section below Review of Systems Objective:    BP (!) 155/71 (BP Location: Right Arm, Patient Position: Sitting, Cuff Size: Normal)   Pulse (!) 47   Temp 97.6 F (36.4 C) (Oral)   Ht 5\' 7"  (1.702 m)   Wt 221 lb (100.2 kg)   BMI 34.61 kg/m   Wt Readings from Last 3 Encounters:  03/10/19 221 lb (100.2 kg)  02/08/19 225 lb (102.1 kg)  12/23/18 229 lb 8 oz (104.1 kg)    Physical Exam Vitals signs and nursing note reviewed.  Constitutional:      General: He is not in acute distress.    Appearance: Normal appearance. He is not ill-appearing.  Pulmonary:     Comments: Speaks in complete sentences, without increased work of breathing. Currently smoking a cigarette Neurological:     Mental Status: He is alert.        Assessment & Plan:   Problem List Items Addressed This Visit    Tobacco abuse    Continues 1 ppd. Encouraged smoking cessation, discussed relation of smoking to lung function and sputum production.  Today he seemed more contemplative than any previous time we've discussed.       COPD mixed type ([place]) - Primary    Anticipate chronic mucous production from known COPD. No signs of Covid or bacterial infection or active COPD exacerbation.  Supportive care reviewed. rec start plain mucinex or fast relief guaifenesin. rec re-trial spiriva daily.  Discussed albuterol rescue inhaler and not to use scheduled but rather PRN dyspnea, wheeze, cough.  Recommend spirometry and ambulatory pulse ox test at next office visit - encouraged he return in 2 months for this.  Encouraged smoking cessation, discussed relation of smoking to lung function and sputum production.      Relevant Medications   guaifenesin (HUMIBID E)  400 MG TABS tablet   tiotropium (SPIRIVA HANDIHALER) 18 MCG inhalation capsule       Meds ordered this encounter  Medications  . guaifenesin (HUMIBID E) 400 MG TABS tablet    Sig: Take 1 tablet (400 mg total) by mouth every 6 (six) hours as needed (mucous congestion).    Dispense:  84 tablet    Refill:  1  . tiotropium (SPIRIVA HANDIHALER) 18 MCG inhalation capsule    Sig: Place 1 capsule (18 mcg total) into inhaler and inhale daily.    Dispense:  30 capsule    Refill:  6  Formulate for patient affordability/insurance coverage (handihaler vs respimat)   No orders of the defined types were placed in this encounter.   Follow up plan: No follow-ups on file.  Ria Bush, MD

## 2019-03-14 ENCOUNTER — Ambulatory Visit: Payer: Medicare Other | Admitting: Cardiovascular Disease

## 2019-03-15 ENCOUNTER — Telehealth: Payer: Medicare Other | Admitting: Cardiovascular Disease

## 2019-03-17 ENCOUNTER — Other Ambulatory Visit: Payer: Self-pay

## 2019-03-18 ENCOUNTER — Ambulatory Visit
Admission: RE | Admit: 2019-03-18 | Discharge: 2019-03-18 | Disposition: A | Discharge status: Home / Self Care | Payer: Medicare Other | Source: Ambulatory Visit | Attending: Radiation Oncology | Admitting: Radiation Oncology

## 2019-03-18 ENCOUNTER — Other Ambulatory Visit: Payer: Self-pay

## 2019-03-18 ENCOUNTER — Other Ambulatory Visit: Payer: Self-pay | Admitting: *Deleted

## 2019-03-18 ENCOUNTER — Encounter: Payer: Self-pay | Admitting: Radiation Oncology

## 2019-03-18 VITALS — BP 120/60 | HR 54 | Temp 96.4°F | Resp 22 | Wt 228.8 lb

## 2019-03-18 DIAGNOSIS — R5383 Other fatigue: Secondary | ICD-10-CM | POA: Insufficient documentation

## 2019-03-18 DIAGNOSIS — C61 Malignant neoplasm of prostate: Secondary | ICD-10-CM

## 2019-03-18 DIAGNOSIS — Z923 Personal history of irradiation: Secondary | ICD-10-CM | POA: Diagnosis not present

## 2019-03-18 NOTE — Progress Notes (Signed)
Radiation Oncology Follow up Note  Name: [name]   Date:   03/18/2019 MRN:  [id number] DOB: 1946/09/01    This 73 y.o. male presents to the clinic today for 1 month follow-up status post.  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  Of the prostate presented with a PSA of 9.7.  Seen today in routine follow-up he is doing well.  He is still somewhat fatigued.  Specifically denies diarrhea dysuria or any other GI/GU complaints.  COMPLICATIONS OF TREATMENT: none  FOLLOW UP COMPLIANCE: keeps appointments   PHYSICAL EXAM:  BP 120/60 (BP Location: Left Arm, Patient Position: Sitting)   Pulse (!) 54   Temp (!) 96.4 F (35.8 C) (Tympanic)   Resp (!) 22   Wt 228 lb 13.4 oz (103.8 kg)   BMI 35.84 kg/m  Well-developed well-nourished patient in NAD. HEENT reveals PERLA, EOMI, discs not visualized.  Oral cavity is clear. No oral mucosal lesions are identified. Neck is clear without evidence of cervical or supraclavicular adenopathy. Lungs are clear to A&P. Cardiac examination is essentially unremarkable with regular rate and rhythm without murmur rub or thrill. Abdomen is benign with no organomegaly or masses noted. Motor sensory and DTR levels are equal and symmetric in the upper and lower extremities. Cranial nerves II through XII are grossly intact. Proprioception is intact. No peripheral adenopathy or edema is identified. No motor or sensory levels are noted. Crude visual fields are within normal range.  RADIOLOGY RESULTS: No current films for review  PLAN: Present time he is doing well without significant side effects or complaints.  He continues to be fatigued although this preceded his radiation therapy treatments.  I have asked to see him back  in 3 to 4 months with a PSA prior to that visit.  Patient knows to call sooner with any concerns at any time.  I am pleased with his overall progress.  I would like to take this opportunity to thank you for allowing me to participate in the care of your patient.Noreene Filbert, MD

## 2019-03-22 ENCOUNTER — Other Ambulatory Visit: Payer: Self-pay | Admitting: Family Medicine

## 2019-03-22 DIAGNOSIS — G894 Chronic pain syndrome: Secondary | ICD-10-CM

## 2019-03-22 NOTE — Telephone Encounter (Signed)
Name of Medication: Hydrocodone-APAP Name of Pharmacy: [facility] Last Fill or Written Date and Quantity: 02/23/19, #90 Last Office Visit and Type: 03/10/19, COPD Next Office Visit and Type: 04/21/19, CPE Pt 2 Last Controlled Substance Agreement Date: 07/06/17 Last UDS: 07/06/17

## 2019-03-23 MED ORDER — HYDROCODONE-ACETAMINOPHEN 10-325 MG PO TABS: 1.0000 | ORAL_TABLET | Freq: Three times a day (TID) | ORAL | 0 refills | Status: DC | PRN

## 2019-03-23 NOTE — Telephone Encounter (Signed)
Eprescribed.

## 2019-04-01 ENCOUNTER — Telehealth: Payer: Self-pay

## 2019-04-01 NOTE — Telephone Encounter (Signed)
1. Do you currently have a fever?No 2. Have you recently travelled on a cruise, internationally, or to [place], [place], [place], [place], [place], or [place], [place] [employer]) ? 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 3. Have you been in contact with someone that is currently pending confirmation of Covid19 testing or has been confirmed to have the [place] virus?  No 4. Are you currently experiencing fatigue or cough? No  Spoke w/ pt. Advised that we are restricting visitors at this time and anyone present in the vehicle should meet the above criteria as well. Advised that visit will be at curbside for finger stick ONLY and will receive call with instructions. Pt also advised to please bring own pen for signature of arrival document.

## 2019-04-03 ENCOUNTER — Other Ambulatory Visit: Payer: Self-pay | Admitting: Cardiovascular Disease

## 2019-04-03 ENCOUNTER — Other Ambulatory Visit: Payer: Self-pay | Admitting: Family Medicine

## 2019-04-04 NOTE — Telephone Encounter (Signed)
Refill Request.  

## 2019-04-04 NOTE — Telephone Encounter (Signed)
Name of Medication: Lomotil Name of Pharmacy: [facility] [address] Last Fill or Written Date and Quantity: 02/23/19, #20 Last Office Visit and Type: 03/10/19, COPD Next Office Visit and Type: 04/21/19, CPE Pt 2 Last Controlled Substance Agreement Date: 07/06/17 Last UDS: 07/06/17

## 2019-04-05 NOTE — Telephone Encounter (Signed)
Eprescribed.

## 2019-04-06 ENCOUNTER — Other Ambulatory Visit: Payer: Self-pay

## 2019-04-06 ENCOUNTER — Ambulatory Visit (INDEPENDENT_AMBULATORY_CARE_PROVIDER_SITE_OTHER): Payer: Medicare Other

## 2019-04-06 DIAGNOSIS — I4819 Other persistent atrial fibrillation: Secondary | ICD-10-CM

## 2019-04-06 DIAGNOSIS — Z5181 Encounter for therapeutic drug level monitoring: Secondary | ICD-10-CM | POA: Diagnosis not present

## 2019-04-06 DIAGNOSIS — I4891 Unspecified atrial fibrillation: Secondary | ICD-10-CM

## 2019-04-06 LAB — POCT INR: INR: 2.6 (ref 2.0–3.0)

## 2019-04-06 NOTE — Patient Instructions (Signed)
Please continue dosage of 1 tablet every day except 1/2 tablet on Mondays, Wednesdays and Fridays.  Recheck INR in 6 weeks.

## 2019-04-18 ENCOUNTER — Ambulatory Visit (INDEPENDENT_AMBULATORY_CARE_PROVIDER_SITE_OTHER): Payer: Medicare Other

## 2019-04-18 ENCOUNTER — Encounter: Payer: Self-pay | Admitting: Family Medicine

## 2019-04-18 ENCOUNTER — Other Ambulatory Visit: Payer: Self-pay

## 2019-04-18 ENCOUNTER — Ambulatory Visit (INDEPENDENT_AMBULATORY_CARE_PROVIDER_SITE_OTHER): Payer: Medicare Other | Admitting: Family Medicine

## 2019-04-18 VITALS — BP 109/54 | HR 85 | Temp 97.3°F | Ht 67.0 in | Wt 226.5 lb

## 2019-04-18 DIAGNOSIS — R350 Frequency of micturition: Secondary | ICD-10-CM

## 2019-04-18 DIAGNOSIS — E782 Mixed hyperlipidemia: Secondary | ICD-10-CM

## 2019-04-18 DIAGNOSIS — IMO0002 Reserved for concepts with insufficient information to code with codable children: Secondary | ICD-10-CM

## 2019-04-18 DIAGNOSIS — G4733 Obstructive sleep apnea (adult) (pediatric): Secondary | ICD-10-CM

## 2019-04-18 DIAGNOSIS — E538 Deficiency of other specified B group vitamins: Secondary | ICD-10-CM

## 2019-04-18 DIAGNOSIS — G8929 Other chronic pain: Secondary | ICD-10-CM

## 2019-04-18 DIAGNOSIS — Z0000 Encounter for general adult medical examination without abnormal findings: Secondary | ICD-10-CM | POA: Diagnosis not present

## 2019-04-18 DIAGNOSIS — I10 Essential (primary) hypertension: Secondary | ICD-10-CM | POA: Diagnosis not present

## 2019-04-18 DIAGNOSIS — Z720 Tobacco use: Secondary | ICD-10-CM | POA: Diagnosis not present

## 2019-04-18 DIAGNOSIS — E559 Vitamin D deficiency, unspecified: Secondary | ICD-10-CM

## 2019-04-18 DIAGNOSIS — E1140 Type 2 diabetes mellitus with diabetic neuropathy, unspecified: Secondary | ICD-10-CM | POA: Diagnosis not present

## 2019-04-18 DIAGNOSIS — I495 Sick sinus syndrome: Secondary | ICD-10-CM | POA: Diagnosis not present

## 2019-04-18 DIAGNOSIS — I716 Thoracoabdominal aortic aneurysm, without rupture, unspecified: Secondary | ICD-10-CM

## 2019-04-18 DIAGNOSIS — I4819 Other persistent atrial fibrillation: Secondary | ICD-10-CM | POA: Diagnosis not present

## 2019-04-18 DIAGNOSIS — G894 Chronic pain syndrome: Secondary | ICD-10-CM

## 2019-04-18 DIAGNOSIS — J449 Chronic obstructive pulmonary disease, unspecified: Secondary | ICD-10-CM | POA: Diagnosis not present

## 2019-04-18 DIAGNOSIS — D509 Iron deficiency anemia, unspecified: Secondary | ICD-10-CM

## 2019-04-18 DIAGNOSIS — I2721 Secondary pulmonary arterial hypertension: Secondary | ICD-10-CM

## 2019-04-18 DIAGNOSIS — E1165 Type 2 diabetes mellitus with hyperglycemia: Secondary | ICD-10-CM

## 2019-04-18 MED ORDER — VARENICLINE TARTRATE 1 MG PO TABS: 1.0000 mg | ORAL_TABLET | Freq: Two times a day (BID) | ORAL | 1 refills | Status: DC

## 2019-04-18 MED ORDER — VARENICLINE TARTRATE 0.5 MG X 11 & 1 MG X 42 PO MISC: ORAL | 0 refills | Status: DC

## 2019-04-18 MED ORDER — HYDROCODONE-ACETAMINOPHEN 10-325 MG PO TABS: 1.0000 | ORAL_TABLET | Freq: Three times a day (TID) | ORAL | 0 refills | Status: DC | PRN

## 2019-04-18 NOTE — Assessment & Plan Note (Signed)
Only takes antihypertensives PRN. Has been having low BP readings.

## 2019-04-18 NOTE — Assessment & Plan Note (Signed)
H/o this - will await pulm eval.

## 2019-04-18 NOTE — Progress Notes (Addendum)
Virtual visit completed through Doxy.Me. Due to national recommendations of social distancing due to [place] 19, a virtual visit is felt to be most appropriate for this patient at this time.   Patient location: home Provider location: [place] at [facility], office If any vitals were documented, they were collected by patient at home unless specified below.   BP (!) 109/54 (BP Location: Left Arm, Patient Position: Sitting)   Pulse 85   Temp (!) 97.3 F (36.3 C) (Oral)   Ht 5\' 7"  (1.702 m)   Wt 226 lb 8 oz (102.7 kg)   BMI 35.47 kg/m    CC: discuss concerns. Subjective:    Patient ID: [name], male    DOB: 1946-05-27, 73 y.o.   MRN: [id number]  HPI: [name] is a 73 y.o. male presenting on 04/18/2019 for Medication Refill (Requests new rx for Chantix and a Hoveround motorized wheelchair. )   Annia Belt earlier today, this visit was scheduled acutely for some concerns he had.   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   [place] CSRS reviewed. Last filled hydrocodone 10/325mg  #90 03/23/2019. Requests updated refill. States he'd like Rx at pharmacy ready to fill when due. I refiled this for him.  Current smoker - 1.5 ppd for 50+ yrs. First time he took chantix it caused hallucinations but he has subsequently taken and tolerated well in the past. Requests chantix refilled.   Would like to have mobility evaluation to see if he qualifies for hover round.   Would like referral for further evaluation for sleep. Doesn't snore. No PNdyspnea. + daytime sleepiness. States previously offered at home- sleep study but he declined. Significant nocturia.      Relevant past medical, surgical, family and social history reviewed and updated as indicated. Interim medical history since our last visit reviewed. Allergies and medications reviewed and updated. Outpatient Medications Prior to Visit  Medication Sig Dispense Refill  . albuterol (PROVENTIL HFA;VENTOLIN HFA) 108 (90 Base) MCG/ACT inhaler  Inhale 2 puffs into the lungs every 6 (six) hours as needed for wheezing or shortness of breath. 1 Inhaler 3  . amiodarone (PACERONE) 200 MG tablet TAKE 1 TABLET(200 MG) BY MOUTH TWICE DAILY 180 tablet 3  . amLODipine (NORVASC) 5 MG tablet TAKE 1 TABLET(5 MG) BY MOUTH DAILY AS NEEDED 90 tablet 3  . Coenzyme Q10 (COQ-10 PO) Take 1 tablet by mouth daily.    Marland Kitchen diltiazem (CARDIZEM) 30 MG tablet TAKE 1 TABLET BY MOUTH EVERY DAY AS DIRECTED AS NEEDED FOR FAST HEART RATE 30 tablet 4  . diphenoxylate-atropine (LOMOTIL) 2.5-0.025 MG tablet TAKE 1 TABLET BY MOUTH TWICE DAILY AS NEEDED 20 tablet 1  . ferrous sulfate 325 (65 FE) MG tablet Take 1 tablet (325 mg total) by mouth daily.    . fish oil-omega-3 fatty acids 1000 MG capsule Take 2 g by mouth daily.     . furosemide (LASIX) 20 MG tablet TAKE 1 TABLET BY MOUTH AS NEEDED FOR FLUID 90 tablet 0  . gabapentin (NEURONTIN) 600 MG tablet Take 1 tablet (600 mg total) by mouth 4 (four) times daily as needed. 120 tablet 6  . guaifenesin (HUMIBID E) 400 MG TABS tablet Take 1 tablet (400 mg total) by mouth every 6 (six) hours as needed (mucous congestion). 84 tablet 1  . metFORMIN (GLUCOPHAGE-XR) 500 MG 24 hr tablet Take 2 tablets (1,000 mg total) by mouth 2 (two) times daily. 360 tablet 3  . methocarbamol (ROBAXIN) 500 MG tablet Take 1  tablet (500 mg total) by mouth 2 (two) times daily. 180 tablet 1  . nitroGLYCERIN (NITROLINGUAL) 0.4 MG/SPRAY spray USE 1 SPRAY AS DIRECTED EVERY 5 MINUTES AS NEEDED 4.9 g 1  . omeprazole (PRILOSEC) 40 MG capsule TAKE 1 CAPSULE BY MOUTH EVERY DAY 90 capsule 0  . ondansetron (ZOFRAN) 4 MG tablet Take 1 tablet (4 mg total) by mouth every 8 (eight) hours as needed for nausea or vomiting. 20 tablet 0  . tamsulosin (FLOMAX) 0.4 MG CAPS capsule Take 1 capsule (0.4 mg total) by mouth 2 (two) times daily. 60 capsule 11  . tiotropium (SPIRIVA HANDIHALER) 18 MCG inhalation capsule Place 1 capsule (18 mcg total) into inhaler and inhale daily. 30  capsule 6  . vitamin B-12 (CYANOCOBALAMIN) 1000 MCG tablet Take 1 tablet (1,000 mcg total) by mouth daily.    Marland Kitchen warfarin (COUMADIN) 5 MG tablet TAKE AS DIRECTED BY COUMADIN CLINIC.Marland Kitchen 100 tablet 0  . HYDROcodone-acetaminophen (NORCO) 10-325 MG tablet Take 1 tablet by mouth every 8 (eight) hours as needed for up to 30 days for severe pain. 90 tablet 0  . metoprolol tartrate (LOPRESSOR) 25 MG tablet Take 1 tablet (25 mg total) by mouth as directed. Once daily at 5 pm 90 tablet 3   No facility-administered medications prior to visit.      Per HPI unless specifically indicated in ROS section below Review of Systems Objective:    BP (!) 109/54 (BP Location: Left Arm, Patient Position: Sitting)   Pulse 85   Temp (!) 97.3 F (36.3 C) (Oral)   Ht 5\' 7"  (1.702 m)   Wt 226 lb 8 oz (102.7 kg)   BMI 35.47 kg/m   Wt Readings from Last 3 Encounters:  04/18/19 226 lb 8 oz (102.7 kg)  03/18/19 228 lb 13.4 oz (103.8 kg)  03/10/19 221 lb (100.2 kg)     Physical exam: Gen: alert, NAD, not ill appearing Pulm: speaks in complete sentences without increased work of breathing Psych: normal mood, normal thought content      Results for orders placed or performed in visit on 03/07/19  POCT INR  Result Value Ref Range   INR 1.7 (A) 2.0 - 3.0  POCT INR  Result Value Ref Range   INR 2.6 2.0 - 3.0   *Note: Due to a large number of results and/or encounters for the requested time period, some results have not been displayed. A complete set of results can be found in Results Review.   Assessment & Plan:   Problem List Items Addressed This Visit    Vitamin D insufficiency   Relevant Orders   VITAMIN D 25 Hydroxy (Vit-D Deficiency, Fractures)   Vitamin B12 deficiency   Relevant Orders   Vitamin B12   Type 2 diabetes, uncontrolled, with neuropathy ([place])    Update labs when he comes in on Wed.       Relevant Orders   Microalbumin / creatinine urine ratio   Hemoglobin A1c   Tobacco abuse    50+  PY hx. Continue to encourage cessation. Rx chantix with discussion on common side effects and adverse effects including slight cardiovascular risk of medication. Pt will monitor for hallucination recurrence and stop right away if this happens.       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HCC)   Sick sinus syndrome (HCC)   PAH (pulmonary artery hypertension) ([place])    H/o this - will await pulm eval.       Relevant Orders   Ambulatory referral  to Pulmonology   OSA (obstructive sleep apnea)    Previously declined CPAP - now would agree to CPAP as long as it's a small device. Will refer to pulm for re evaluation.       Relevant Orders   Ambulatory referral to Pulmonology   Iron deficiency anemia   Relevant Orders   CBC with Differential/Platelet   Hyperlipidemia, unspecified   Relevant Orders   Lipid panel   Comprehensive metabolic panel   HTN (hypertension)    Only takes antihypertensives PRN. Has been having low BP readings.       Encounter for chronic pain management    [place] CSRS reviewed.  Will need updated UDS      COPD mixed type (HCC)   Relevant Medications   varenicline (CHANTIX CONTINUING MONTH PAK) 1 MG tablet   varenicline (CHANTIX STARTING MONTH PAK) 0.5 MG X 11 & 1 MG X 42 tablet   Other Relevant Orders   Ambulatory referral to Pulmonology   Chronic pain syndrome - Primary (Chronic)    Discussed #90 hydrocodone 10/325mg  tablets should last the whole month. Will refill, but pharmacy will refill when next due.       Relevant Medications   HYDROcodone-acetaminophen (NORCO) 10-325 MG tablet   Atrial fibrillation (HCC)    Continue amiodarone, coumadin.        Other Visit Diagnoses    Urine frequency       Relevant Orders   Urinalysis Dipstick       Meds ordered this encounter  Medications  . HYDROcodone-acetaminophen (NORCO) 10-325 MG tablet    Sig: Take 1 tablet by mouth every 8 (eight) hours as needed for up to 30 days for severe pain.    Dispense:  90  tablet    Refill:  0    Refill when next due  . varenicline (CHANTIX CONTINUING MONTH PAK) 1 MG tablet    Sig: Take 1 tablet (1 mg total) by mouth 2 (two) times daily.    Dispense:  60 tablet    Refill:  1  . varenicline (CHANTIX STARTING MONTH PAK) 0.5 MG X 11 & 1 MG X 42 tablet    Sig: Take one 0.5 mg tablet by mouth once daily for 3 days, then increase to one 0.5 mg tablet twice daily for 4 days, then increase to one 1 mg tablet twice daily.    Dispense:  53 tablet    Refill:  0   Orders Placed This Encounter  Procedures  . Lipid panel    Standing Status:   Future    Standing Expiration Date:   04/17/2020  . Microalbumin / creatinine urine ratio    Standing Status:   Future    Standing Expiration Date:   04/17/2020  . CBC with Differential/Platelet    Standing Status:   Future    Standing Expiration Date:   04/17/2020  . Hemoglobin A1c    Standing Status:   Future    Standing Expiration Date:   04/17/2020  . Comprehensive metabolic panel    Standing Status:   Future    Standing Expiration Date:   04/17/2020  . Vitamin B12    Standing Status:   Future    Standing Expiration Date:   04/17/2020  . VITAMIN D 25 Hydroxy (Vit-D Deficiency, Fractures)    Standing Status:   Future    Standing Expiration Date:   04/17/2020  . Ambulatory referral to Pulmonology    Referral Priority:   Routine  Referral Type:   Consultation    Referral Reason:   Specialty Services Required    Requested Specialty:   Pulmonary Disease    Number of Visits Requested:   1  . Urinalysis Dipstick    Standing Status:   Future    Standing Expiration Date:   05/19/2019    Follow up plan: Return if symptoms worsen or fail to improve.  Ria Bush, MD

## 2019-04-18 NOTE — Assessment & Plan Note (Signed)
Continue amiodarone, coumadin.

## 2019-04-18 NOTE — Assessment & Plan Note (Signed)
Discussed #90 hydrocodone 10/325mg  tablets should last the whole month. Will refill, but pharmacy will refill when next due.

## 2019-04-18 NOTE — Assessment & Plan Note (Signed)
50+ PY hx. Continue to encourage cessation. Rx chantix with discussion on common side effects and adverse effects including slight cardiovascular risk of medication. Pt will monitor for hallucination recurrence and stop right away if this happens.

## 2019-04-18 NOTE — Progress Notes (Signed)
PCP notes:   Health maintenance:  Urine micoalbumin - postponed  Abnormal screenings:   None  Patient concerns:   Medication management and request for HoverRound prescription  - same day appt scheduled with PCP  Nurse concerns:  None  Next PCP appt:   04/21/19 @ 0930

## 2019-04-18 NOTE — Assessment & Plan Note (Signed)
Update labs when he comes in on Wed.

## 2019-04-18 NOTE — Patient Instructions (Signed)
[name] , Thank you for taking time to come for your Medicare Wellness Visit. I appreciate your ongoing commitment to your health goals. Please review the following plan we discussed and let me know if I can assist you in the future.   These are the goals we discussed: Goals    . DIET - INCREASE WATER INTAKE     Starting 04/18/2019, I will continue to drink at least 10 bottles of water daily.        This is a list of the screening recommended for you and due dates:  Health Maintenance  Topic Date Due  . Eye exam for diabetics  12/11/2018  . Urine Protein Check  12/14/2018  . DTaP/Tdap/Td vaccine (1 - Tdap) 12/09/2053*  . Colon Cancer Screening  12/09/2053*  . Tetanus Vaccine  12/09/2053*  . Pneumonia vaccines (1 of 2 - PCV13) 12/09/2053*  . Hemoglobin A1C  05/04/2019  . Flu Shot  07/09/2019  . Complete foot exam   11/04/2019  .  Hepatitis C: One time screening is recommended by Center for Disease Control  (CDC) for  adults born from [birth year] through 1967.   Completed  *Topic was postponed. The date shown is not the original due date.   Preventive Care for Adults  A healthy lifestyle and preventive care can promote health and wellness. Preventive health guidelines for adults include the following key practices.  . A routine yearly physical is a good way to check with your health care provider about your health and preventive screening. It is a chance to share any concerns and updates on your health and to receive a thorough exam.  . Visit your dentist for a routine exam and preventive care every 6 months. Brush your teeth twice a day and floss once a day. Good oral hygiene prevents tooth decay and gum disease.  . The frequency of eye exams is based on your age, health, family medical history, use  of contact lenses, and other factors. Follow your health care provider's recommendations for frequency of eye exams.  . Eat a healthy diet. Foods like vegetables, fruits, whole grains,  low-fat dairy products, and lean protein foods contain the nutrients you need without too many calories. Decrease your intake of foods high in solid fats, added sugars, and salt. Eat the right amount of calories for you. Get information about a proper diet from your health care provider, if necessary.  . Regular physical exercise is one of the most important things you can do for your health. Most adults should get at least 150 minutes of moderate-intensity exercise (any activity that increases your heart rate and causes you to sweat) each week. In addition, most adults need muscle-strengthening exercises on 2 or more days a week.  Silver Sneakers may be a benefit available to you. To determine eligibility, you may visit the website: www.silversneakers.com or contact program at (916) 561-6765 Mon-Fri between 8AM-8PM.   . Maintain a healthy weight. The body mass index (BMI) is a screening tool to identify possible weight problems. It provides an estimate of body fat based on height and weight. Your health care provider can find your BMI and can help you achieve or maintain a healthy weight.   For adults 20 years and older: ? A BMI below 18.5 is considered underweight. ? A BMI of 18.5 to 24.9 is normal. ? A BMI of 25 to 29.9 is considered overweight. ? A BMI of 30 and above is considered obese.   . Maintain  normal blood lipids and cholesterol levels by exercising and minimizing your intake of saturated fat. Eat a balanced diet with plenty of fruit and vegetables. Blood tests for lipids and cholesterol should begin at age 2 and be repeated every 5 years. If your lipid or cholesterol levels are high, you are over 50, or you are at high risk for heart disease, you may need your cholesterol levels checked more frequently. Ongoing high lipid and cholesterol levels should be treated with medicines if diet and exercise are not working.  . If you smoke, find out from your health care provider how to quit. If  you do not use tobacco, please do not start.  . If you choose to drink alcohol, please do not consume more than 2 drinks per day. One drink is considered to be 12 ounces (355 mL) of beer, 5 ounces (148 mL) of wine, or 1.5 ounces (44 mL) of liquor.  . If you are 68-22 years old, ask your health care provider if you should take aspirin to prevent strokes.  . Use sunscreen. Apply sunscreen liberally and repeatedly throughout the day. You should seek shade when your shadow is shorter than you. Protect yourself by wearing long sleeves, pants, a wide-brimmed hat, and sunglasses year round, whenever you are outdoors.  . Once a month, do a whole body skin exam, using a mirror to look at the skin on your back. Tell your health care provider of new moles, moles that have irregular borders, moles that are larger than a pencil eraser, or moles that have changed in shape or color.

## 2019-04-18 NOTE — Addendum Note (Signed)
Addended by: Ria Bush on: 04/18/2019 05:05 PM   Modules accepted: Orders

## 2019-04-18 NOTE — Progress Notes (Signed)
Subjective:   [name] is a 73 y.o. male who presents for Medicare Annual/Subsequent preventive examination.  Review of Systems:  N/A Cardiac Risk Factors include: advanced age (>74men, >60 women);male gender;obesity (BMI >30kg/m2);smoking/ tobacco exposure;hypertension;diabetes mellitus;dyslipidemia     Objective:    Vitals: There were no vitals taken for this visit.  There is no height or weight on file to calculate BMI.  Advanced Directives 04/18/2019 03/18/2019 09/28/2018 12/14/2017 09/07/2017 01/21/2017 12/09/2016  Does Patient Have a Medical Advance Directive? No No No No No No No  Would patient like information on creating a medical advance directive? No - Patient declined No - Patient declined No - Patient declined Yes (MAU/Ambulatory/Procedural Areas - Information given) No - Patient declined No - Patient declined -  Pre-existing out of facility DNR order (yellow form or pink MOST form) - - - - - - -    Tobacco Social History   Tobacco Use  Smoking Status Current Every Day Smoker  . Packs/day: 1.00  . Years: 52.00  . Pack years: 52.00  . Types: Cigarettes  Smokeless Tobacco Never Used     Ready to quit: No Counseling given: No   Clinical Intake:  Pre-visit preparation completed: Yes        Nutritional Status: BMI > 30  Obese Nutritional Risks: None Diabetes: Yes CBG done?: No Did pt. bring in CBG monitor from home?: No  How often do you need to have someone help you when you read instructions, pamphlets, or other written materials from your doctor or pharmacy?: 1 - Never What is the last grade level you completed in school?: 12th grade + some college  Interpreter Needed?: No  Comments: pt lives with spouse Information entered by :: LPinson, LPN  Past Medical History:  Diagnosis Date  . AAA (abdominal aortic aneurysm) ([place]) 2013   s/p stent graft repair now with supra/pararenal aneurysm 3.5cm, referred to [facility]. Sammuel Hines at [facility] for endovascular repair  (12/2013)  . Abnormal drug screen 06/2015   see problem list  . Cervical neck pain with evidence of disc disease 07/2011   MRI - disk bulging and foraminal stenosis, advanced at C4/5, 5/6; rec pain management for ESI by Dr. Mack Guise   . COPD (chronic obstructive pulmonary disease) ([facility])    mod-severe COPD/emphysema.  PFTs 12/2010.  He still smokes 1 ppd.  . Depression   . ED (erectile dysfunction) 02/2012   penile injections - failed viagra, poor arterial flow ([place])  . Fatty liver   . GERD (gastroesophageal reflux disease)   . Hyperlipidemia    myalgias with simvastatin and atorvastatin  . Leg cramps    idiopathic severe  . Muscle spasm    chronic  . Neuralgia    pain in hands. L>R from accident  . Obesity   . Olecranon bursitis of left elbow 09/01/2014   S/p aspiration x3 in our office and x2 by ortho [facility])   . OSA (obstructive sleep apnea) 03/2012   AHI 18.6, desat to 74%, severe snoring, consider ENT eval  . Osteoarthritis   . Paroxysmal atrial fibrillation (HCC)    on coumadin only.  . Peripheral autonomic neuropathy due to diabetes mellitus ([place])   . Prostate cancer ([place]) 12/18/2017   Gleason 4+3 - 7 in 1/12 cores 08/2018 given comorbidities rec radiation therapy by Dr Donella Stade in [place])  . Right shoulder injury 05/2012   after fall out of chair, s/p injection, rec conservative management with PT Noemi [facility])  . Smoker  1ppd  . T2DM (type 2 diabetes mellitus) ([place])    Past Surgical History:  Procedure Laterality Date  . CHOLECYSTECTOMY  2001  . CTA abd  09/2011   6.1cm AAA, bilateral ing hernias, R with bladder wall, promient prostate calcifications  . ENDOVASCULAR STENT INSERTION  11/11/2011   Procedure: ENDOVASCULAR STENT GRAFT INSERTION;  Surgeon: Angelia Mould, MD;  Location: [facility];  Service: Vascular;  Laterality: N/A;  aorta bi iliac  . KNEE SURGERY     L side cartilage taken out  . PFTs  12/2010   mod-severe obstruction, ?bronchodilator  response  . TONSILLECTOMY     Family History  Problem Relation Age of Onset  . Heart disease Father   . Leukemia Father   . Coronary artery disease Father   . Melanoma Sister   . Arthritis Mother   . Diabetes Neg Hx   . Stroke Neg Hx    Social History   Socioeconomic History  . Marital status: Married    Spouse name: Juliann Pulse  . Number of children: 3  . Years of education: 12th gr  . Highest education level: Not on file  Occupational History  . Occupation: truck Education administrator: TRANSPORTATION    Comment: for years  . Occupation: Corporate treasurer: [employer]    Comment: runs this  Social Needs  . Financial resource strain: Not on file  . Food insecurity:    Worry: Not on file    Inability: Not on file  . Transportation needs:    Medical: Not on file    Non-medical: Not on file  Tobacco Use  . Smoking status: Current Every Day Smoker    Packs/day: 1.00    Years: 52.00    Pack years: 52.00    Types: Cigarettes  . Smokeless tobacco: Never Used  Substance and Sexual Activity  . Alcohol use: Yes    Alcohol/week: 0.0 standard drinks    Comment: beer occassionally  . Drug use: No  . Sexual activity: Not on file  Lifestyle  . Physical activity:    Days per week: Not on file    Minutes per session: Not on file  . Stress: Not on file  Relationships  . Social connections:    Talks on phone: Not on file    Gets together: Not on file    Attends religious service: Not on file    Active member of club or organization: Not on file    Attends meetings of clubs or organizations: Not on file    Relationship status: Not on file  Other Topics Concern  . Not on file  Social History Narrative   Caffeine: 1 cup coffee, 1/2 gallon unsweet tea, 2 soda/day   Lives with wife and 1 dog, 6 cats outside.   Occ: Long distance truck driver   Started seeking care when received medicare.   H/o noncompliance    Outpatient Encounter Medications as of 04/18/2019  Medication  Sig  . albuterol (PROVENTIL HFA;VENTOLIN HFA) 108 (90 Base) MCG/ACT inhaler Inhale 2 puffs into the lungs every 6 (six) hours as needed for wheezing or shortness of breath.  Marland Kitchen amiodarone (PACERONE) 200 MG tablet TAKE 1 TABLET(200 MG) BY MOUTH TWICE DAILY  . amLODipine (NORVASC) 5 MG tablet TAKE 1 TABLET(5 MG) BY MOUTH DAILY AS NEEDED  . Coenzyme Q10 (COQ-10 PO) Take 1 tablet by mouth daily.  Marland Kitchen diltiazem (CARDIZEM) 30 MG tablet TAKE 1 TABLET BY MOUTH  EVERY DAY AS DIRECTED AS NEEDED FOR FAST HEART RATE  . diphenoxylate-atropine (LOMOTIL) 2.5-0.025 MG tablet TAKE 1 TABLET BY MOUTH TWICE DAILY AS NEEDED  . fish oil-omega-3 fatty acids 1000 MG capsule Take 2 g by mouth daily.   . furosemide (LASIX) 20 MG tablet TAKE 1 TABLET BY MOUTH AS NEEDED FOR FLUID  . gabapentin (NEURONTIN) 600 MG tablet Take 1 tablet (600 mg total) by mouth 4 (four) times daily as needed.  Marland Kitchen guaifenesin (HUMIBID E) 400 MG TABS tablet Take 1 tablet (400 mg total) by mouth every 6 (six) hours as needed (mucous congestion).  Marland Kitchen HYDROcodone-acetaminophen (NORCO) 10-325 MG tablet Take 1 tablet by mouth every 8 (eight) hours as needed for up to 30 days for severe pain.  . metFORMIN (GLUCOPHAGE-XR) 500 MG 24 hr tablet Take 2 tablets (1,000 mg total) by mouth 2 (two) times daily.  . methocarbamol (ROBAXIN) 500 MG tablet Take 1 tablet (500 mg total) by mouth 2 (two) times daily.  . nitroGLYCERIN (NITROLINGUAL) 0.4 MG/SPRAY spray USE 1 SPRAY AS DIRECTED EVERY 5 MINUTES AS NEEDED  . omeprazole (PRILOSEC) 40 MG capsule TAKE 1 CAPSULE BY MOUTH EVERY DAY  . ondansetron (ZOFRAN) 4 MG tablet Take 1 tablet (4 mg total) by mouth every 8 (eight) hours as needed for nausea or vomiting.  . tamsulosin (FLOMAX) 0.4 MG CAPS capsule Take 1 capsule (0.4 mg total) by mouth 2 (two) times daily.  Marland Kitchen tiotropium (SPIRIVA HANDIHALER) 18 MCG inhalation capsule Place 1 capsule (18 mcg total) into inhaler and inhale daily.  . vitamin B-12 (CYANOCOBALAMIN) 1000 MCG  tablet Take 1 tablet (1,000 mcg total) by mouth daily.  Marland Kitchen warfarin (COUMADIN) 5 MG tablet TAKE AS DIRECTED BY COUMADIN CLINIC..  . ferrous sulfate 325 (65 FE) MG tablet Take 1 tablet (325 mg total) by mouth daily. (Patient not taking: Reported on 04/18/2019)  . metoprolol tartrate (LOPRESSOR) 25 MG tablet Take 1 tablet (25 mg total) by mouth as directed. Once daily at 5 pm   No facility-administered encounter medications on file as of 04/18/2019.     Activities of Daily Living In your present state of health, do you have any difficulty performing the following activities: 04/18/2019  Hearing? Y  Vision? N  Difficulty concentrating or making decisions? N  Walking or climbing stairs? Y  Dressing or bathing? N  Doing errands, shopping? N  Preparing Food and eating ? N  Using the Toilet? N  In the past six months, have you accidently leaked urine? Y  Do you have problems with loss of bowel control? Y  Managing your Medications? Y  Managing your Finances? N  Housekeeping or managing your Housekeeping? Y  Some recent data might be hidden    Patient Care Team: Ria Bush, MD as PCP - General (Family Medicine) Minna Merritts, MD (Cardiology) Thomasene Ripple, MD as Attending Physician (Vascular Surgery)   Assessment:   This is a routine wellness examination for [place].  Vision Screening Comments: Vision exam in Feb 2020 @ [facility]  Exercise Activities and Dietary recommendations Current Exercise Habits: The patient does not participate in regular exercise at present, Exercise limited by: None identified  Goals    . DIET - INCREASE WATER INTAKE     Starting 04/18/2019, I will continue to drink at least 10 bottles of water daily.        Fall Risk Fall Risk  04/18/2019 01/06/2018 01/04/2018 12/16/2017 12/14/2017  Falls in the past year? 0 Yes Yes No  No  Comment - - - - -  Number falls in past yr: - - 1 - -  Comment - - - - -  Injury with Fall? - No - - -  Risk Factor Category  - -  - - -  Risk for fall due to : - - Impaired balance/gait - -  Follow up - - - - -    Depression Screen PHQ 2/9 Scores 04/18/2019 01/06/2018 01/04/2018 12/16/2017  PHQ - 2 Score 0 0 0 0  PHQ- 9 Score 0 - - -  Exception Documentation - - - -    Cognitive Function MMSE - Mini Mental State Exam 04/18/2019 12/14/2017 12/09/2016  Orientation to time 5 5 5   Orientation to Place 5 5 5   Registration 3 3 3   Attention/ Calculation 0 0 0  Recall 3 3 3   Language- name 2 objects 0 0 0  Language- repeat 1 1 1   Language- follow 3 step command 0 2 3  Language- follow 3 step command-comments - unable to follow 1 step of 3 step command -  Language- read & follow direction 0 0 0  Write a sentence 0 0 0  Copy design 0 0 0  Total score 17 19 20      PLEASE NOTE: A Mini-Cog screen was completed. Maximum score is 17. A value of 0 denotes this part of [place] MMSE was not completed or the patient failed this part of the Mini-Cog screening.   Mini-Cog Screening Orientation to Time - Max 5 pts Orientation to Place - Max 5 pts Registration - Max 3 pts Recall - Max 3 pts Language Repeat - Max 1 pts       There is no immunization history on file for this patient.   Screening Tests Health Maintenance  Topic Date Due  . OPHTHALMOLOGY EXAM  12/11/2018  . URINE MICROALBUMIN  12/14/2018  . DTaP/Tdap/Td (1 - Tdap) 12/09/2053 (Originally 05/03/1965)  . COLONOSCOPY  12/09/2053 (Originally 05/02/1994)  . TETANUS/TDAP  12/09/2053 (Originally 05/03/1965)  . PNA vac Low Risk Adult (1 of 2 - PCV13) 12/09/2053 (Originally 05/02/2009)  . HEMOGLOBIN A1C  05/04/2019  . INFLUENZA VACCINE  07/09/2019  . FOOT EXAM  11/04/2019  . Hepatitis C Screening  Completed      Plan:     I have personally reviewed, addressed, and noted the following in the patient's chart:  A. Medical and social history B. Use of alcohol, tobacco or illicit drugs  C. Current medications and supplements D. Functional ability and status E.   Nutritional status F.  Physical activity G. Advance directives H. List of other physicians I.  Hospitalizations, surgeries, and ER visits in previous 12 months J.  Vitals (unless it is a telemedicine encounter) K. Screenings to include cognitive, depression, hearing, vision (NOTE: hearing and vision screenings not completed in telemedicine encounter) L. Referrals and appointments   In addition, I have reviewed and discussed with patient certain preventive protocols, quality metrics, and best practice recommendations. A written personalized care plan for preventive services and recommendations were provided to patient.  With patient's permission, we connected on 04/18/19 at 12:00 PM EDT by a video enabled telemedicine application. Two patient identifiers were used to ensure the encounter occurred with the correct person.    Patient was in home and writer was in office.   Signed,   Lindell Noe, MHA, BS, LPN Health Coach

## 2019-04-18 NOTE — Assessment & Plan Note (Signed)
Previously declined CPAP - now would agree to CPAP as long as it's a small device. Will refer to pulm for re evaluation.

## 2019-04-18 NOTE — Assessment & Plan Note (Signed)
[place] CSRS reviewed.  Will need updated UDS

## 2019-04-20 ENCOUNTER — Other Ambulatory Visit: Payer: Self-pay

## 2019-04-20 ENCOUNTER — Other Ambulatory Visit (INDEPENDENT_AMBULATORY_CARE_PROVIDER_SITE_OTHER): Payer: Medicare Other

## 2019-04-20 DIAGNOSIS — E1140 Type 2 diabetes mellitus with diabetic neuropathy, unspecified: Secondary | ICD-10-CM

## 2019-04-20 DIAGNOSIS — E1165 Type 2 diabetes mellitus with hyperglycemia: Secondary | ICD-10-CM | POA: Diagnosis not present

## 2019-04-20 DIAGNOSIS — IMO0002 Reserved for concepts with insufficient information to code with codable children: Secondary | ICD-10-CM

## 2019-04-20 DIAGNOSIS — R350 Frequency of micturition: Secondary | ICD-10-CM | POA: Diagnosis not present

## 2019-04-20 DIAGNOSIS — D509 Iron deficiency anemia, unspecified: Secondary | ICD-10-CM | POA: Diagnosis not present

## 2019-04-20 DIAGNOSIS — E559 Vitamin D deficiency, unspecified: Secondary | ICD-10-CM

## 2019-04-20 DIAGNOSIS — E782 Mixed hyperlipidemia: Secondary | ICD-10-CM | POA: Diagnosis not present

## 2019-04-20 DIAGNOSIS — E538 Deficiency of other specified B group vitamins: Secondary | ICD-10-CM | POA: Diagnosis not present

## 2019-04-20 LAB — COMPREHENSIVE METABOLIC PANEL
ALT: 13 U/L (ref 0–53)
AST: 16 U/L (ref 0–37)
Albumin: 3.5 g/dL (ref 3.5–5.2)
Alkaline Phosphatase: 69 U/L (ref 39–117)
BUN: 12 mg/dL (ref 6–23)
CO2: 31 mEq/L (ref 19–32)
Calcium: 8.6 mg/dL (ref 8.4–10.5)
Chloride: 104 mEq/L (ref 96–112)
Creatinine, Ser: 0.92 mg/dL (ref 0.40–1.50)
GFR: 80.21 mL/min (ref >60.00)
Glucose, Bld: 119 mg/dL — ABNORMAL HIGH (ref 70–99)
Potassium: 4.4 mEq/L (ref 3.5–5.1)
Sodium: 140 mEq/L (ref 135–145)
Total Bilirubin: 0.4 mg/dL (ref 0.2–1.2)
Total Protein: 6.1 g/dL (ref 6.0–8.3)

## 2019-04-20 LAB — CBC WITH DIFFERENTIAL/PLATELET
Basophils Absolute: 0.1 10*3/uL (ref 0.0–0.1)
Basophils Relative: 0.7 % (ref 0.0–3.0)
Eosinophils Absolute: 0.2 10*3/uL (ref 0.0–0.7)
Eosinophils Relative: 3 % (ref 0.0–5.0)
HCT: 43.2 % (ref 39.0–52.0)
Hemoglobin: 14.6 g/dL (ref 13.0–17.0)
Lymphocytes Relative: 20.7 % (ref 12.0–46.0)
Lymphs Abs: 1.6 10*3/uL (ref 0.7–4.0)
MCHC: 33.8 g/dL (ref 30.0–36.0)
MCV: 95.3 fl (ref 78.0–100.0)
Monocytes Absolute: 0.4 10*3/uL (ref 0.1–1.0)
Monocytes Relative: 5.2 % (ref 3.0–12.0)
Neutro Abs: 5.3 10*3/uL (ref 1.4–7.7)
Neutrophils Relative %: 70.4 % (ref 43.0–77.0)
Platelets: 210 10*3/uL (ref 150.0–400.0)
RBC: 4.53 Mil/uL (ref 4.22–5.81)
RDW: 13.9 % (ref 11.5–15.5)
WBC: 7.5 10*3/uL (ref 4.0–10.5)

## 2019-04-20 LAB — POCT URINALYSIS DIPSTICK
Bilirubin, UA: NEGATIVE
Blood, UA: NEGATIVE
Glucose, UA: NEGATIVE
Ketones, UA: NEGATIVE
Leukocytes, UA: NEGATIVE
Nitrite, UA: NEGATIVE
Protein, UA: NEGATIVE
Spec Grav, UA: 1.02 (ref 1.010–1.025)
Urobilinogen, UA: 0.2 E.U./dL
pH, UA: 6 (ref 5.0–8.0)

## 2019-04-20 LAB — LIPID PANEL
Cholesterol: 175 mg/dL (ref 0–200)
HDL: 35.5 mg/dL — ABNORMAL LOW (ref >39.00)
LDL Cholesterol: 108 mg/dL — ABNORMAL HIGH (ref 0–99)
NonHDL: 139.75
Total CHOL/HDL Ratio: 5
Triglycerides: 159 mg/dL — ABNORMAL HIGH (ref 0.0–149.0)
VLDL: 31.8 mg/dL (ref 0.0–40.0)

## 2019-04-20 LAB — VITAMIN D 25 HYDROXY (VIT D DEFICIENCY, FRACTURES): VITD: 18.9 ng/mL — ABNORMAL LOW (ref 30.00–100.00)

## 2019-04-20 LAB — VITAMIN B12: Vitamin B-12: 803 pg/mL (ref 211–911)

## 2019-04-20 LAB — MICROALBUMIN / CREATININE URINE RATIO
Creatinine,U: 86.5 mg/dL
Microalb Creat Ratio: 1.5 mg/g (ref 0.0–30.0)
Microalb, Ur: 1.3 mg/dL (ref 0.0–1.9)

## 2019-04-20 LAB — HEMOGLOBIN A1C: Hgb A1c MFr Bld: 6.7 % — ABNORMAL HIGH (ref 4.6–6.5)

## 2019-04-20 NOTE — Addendum Note (Signed)
Addended by: Ellamae Sia on: 04/20/2019 09:33 AM   Modules accepted: Orders

## 2019-04-21 ENCOUNTER — Telehealth: Payer: Self-pay | Admitting: Internal Medicine

## 2019-04-21 ENCOUNTER — Encounter: Payer: Self-pay | Admitting: Family Medicine

## 2019-04-21 ENCOUNTER — Telehealth: Payer: Self-pay

## 2019-04-21 ENCOUNTER — Ambulatory Visit (INDEPENDENT_AMBULATORY_CARE_PROVIDER_SITE_OTHER): Payer: Medicare Other | Admitting: Family Medicine

## 2019-04-21 VITALS — BP 110/76 | HR 69 | Temp 97.5°F | Ht 67.0 in | Wt 226.0 lb

## 2019-04-21 DIAGNOSIS — R351 Nocturia: Secondary | ICD-10-CM

## 2019-04-21 DIAGNOSIS — E782 Mixed hyperlipidemia: Secondary | ICD-10-CM | POA: Diagnosis not present

## 2019-04-21 DIAGNOSIS — E559 Vitamin D deficiency, unspecified: Secondary | ICD-10-CM

## 2019-04-21 DIAGNOSIS — I716 Thoracoabdominal aortic aneurysm, without rupture, unspecified: Secondary | ICD-10-CM

## 2019-04-21 DIAGNOSIS — J449 Chronic obstructive pulmonary disease, unspecified: Secondary | ICD-10-CM

## 2019-04-21 DIAGNOSIS — E1142 Type 2 diabetes mellitus with diabetic polyneuropathy: Secondary | ICD-10-CM

## 2019-04-21 DIAGNOSIS — I7141 Pararenal abdominal aortic aneurysm, without rupture: Secondary | ICD-10-CM

## 2019-04-21 DIAGNOSIS — Z7901 Long term (current) use of anticoagulants: Secondary | ICD-10-CM

## 2019-04-21 DIAGNOSIS — D509 Iron deficiency anemia, unspecified: Secondary | ICD-10-CM | POA: Diagnosis not present

## 2019-04-21 DIAGNOSIS — IMO0002 Reserved for concepts with insufficient information to code with codable children: Secondary | ICD-10-CM

## 2019-04-21 DIAGNOSIS — I4819 Other persistent atrial fibrillation: Secondary | ICD-10-CM | POA: Diagnosis not present

## 2019-04-21 DIAGNOSIS — Z7189 Other specified counseling: Secondary | ICD-10-CM

## 2019-04-21 DIAGNOSIS — M150 Primary generalized (osteo)arthritis: Secondary | ICD-10-CM | POA: Diagnosis not present

## 2019-04-21 DIAGNOSIS — I714 Abdominal aortic aneurysm, without rupture, unspecified: Secondary | ICD-10-CM

## 2019-04-21 DIAGNOSIS — I495 Sick sinus syndrome: Secondary | ICD-10-CM

## 2019-04-21 DIAGNOSIS — E66811 Obesity, class 1: Secondary | ICD-10-CM

## 2019-04-21 DIAGNOSIS — M159 Polyosteoarthritis, unspecified: Secondary | ICD-10-CM

## 2019-04-21 DIAGNOSIS — E669 Obesity, unspecified: Secondary | ICD-10-CM

## 2019-04-21 DIAGNOSIS — C61 Malignant neoplasm of prostate: Secondary | ICD-10-CM

## 2019-04-21 DIAGNOSIS — E538 Deficiency of other specified B group vitamins: Secondary | ICD-10-CM

## 2019-04-21 DIAGNOSIS — Z720 Tobacco use: Secondary | ICD-10-CM | POA: Diagnosis not present

## 2019-04-21 DIAGNOSIS — E1140 Type 2 diabetes mellitus with diabetic neuropathy, unspecified: Secondary | ICD-10-CM | POA: Diagnosis not present

## 2019-04-21 DIAGNOSIS — K8689 Other specified diseases of pancreas: Secondary | ICD-10-CM

## 2019-04-21 DIAGNOSIS — E1165 Type 2 diabetes mellitus with hyperglycemia: Secondary | ICD-10-CM

## 2019-04-21 DIAGNOSIS — I700 Atherosclerosis of aorta: Secondary | ICD-10-CM

## 2019-04-21 MED ORDER — PRAVASTATIN SODIUM 20 MG PO TABS: 20.0000 mg | ORAL_TABLET | Freq: Every day | ORAL | 3 refills | Status: DC

## 2019-04-21 MED ORDER — VITAMIN D3 25 MCG (1000 UT) PO CAPS: 1.0000 | ORAL_CAPSULE | Freq: Every day | ORAL | Status: DC

## 2019-04-21 MED ORDER — MIRABEGRON ER 25 MG PO TB24: 25.0000 mg | ORAL_TABLET | Freq: Every day | ORAL | 1 refills | Status: DC

## 2019-04-21 MED ORDER — FERROUS SULFATE 325 (65 FE) MG PO TABS: 325.0000 mg | ORAL_TABLET | ORAL | Status: DC

## 2019-04-21 NOTE — Assessment & Plan Note (Addendum)
Advanced directive discussion - working on this at home. Wife would be HCPOA. New packet mailed today.

## 2019-04-21 NOTE — Patient Instructions (Addendum)
We will mail you stool kit.  Advanced directive packet mailed today.  Start vitamin D3 1000 units daily.  Start pravastatin 63m nightly, if trouble with muscle aches, decrease to twice a week.  Trial myrbetriq for urinary issues. Price out at pharmacy.  Return in 3 months for follow up visit.

## 2019-04-21 NOTE — Telephone Encounter (Signed)
Called to screen patient for apt tomorrow, wife states they need to reschedule. They will call us back when they get home. Will cx apt for 04/22/19.

## 2019-04-21 NOTE — Telephone Encounter (Signed)
Left message to give directions and do covid screening.

## 2019-04-21 NOTE — Telephone Encounter (Signed)
Pls mail iFOB kit to pt.

## 2019-04-21 NOTE — Progress Notes (Signed)
Virtual visit completed through [employer]. Due to national recommendations of social distancing due to [place] 19, a virtual visit is felt to be most appropriate for this patient at this time.   Patient location: home Provider location: [place] at [facility], office If any vitals were documented, they were collected by patient at home unless specified below.    BP 110/76 (BP Location: Left Arm, Patient Position: Sitting)   Pulse 69   Temp (!) 97.5 F (36.4 C) (Oral)   Ht '5\' 7"'$  (1.702 m)   Wt 226 lb (102.5 kg)   BMI 35.40 kg/m    CC: AMW f/u visit Subjective:    Patient ID: [name], male    DOB: 12/26/1945, 73 y.o.   MRN: [id number]  HPI: [name] is a 73 y.o. male presenting on 04/21/2019 for Annual Exam (Pt 2. )   Saw Katha Cabal last week for medicare wellness visit. Note reviewed.    Bad night last night - woke up with chest discomfort that improved when he sat up, feeling hot. No fever, dyspnea, cough. Notes increased phlegm but no wheezing. Had hamburger patties for dinner last night.   Urinary issues - noticing increasing nocturia despite flomax 0.'8mg'$  after supper. No significant daytime symptoms. No stress incontinence. Does have urinary urgency with incontinence (dribbling). Significant dry mouth.   Preventative: Colon cancer screening - h/o IDA that resolved with replacement. He had stopped iron - will restart. Last saw GI [facility]) 12/2017. Hesitant for luminal evaluation given comorbidities. Never returned iFOB - new test mailed today. He had reassuring abd/pelvic CT. From GI's last note: "If he were to consent to a procedure or sedation further cardiac clearance would be required due his bradycardia" Prostate cancer treatment - completed radiation therapy ([place]).  Lung cancer screening - see above - planned CT chest Flu shot - declines Tetanus - unsure Pneumonia shot - declines shingrix - declines Advanced directive discussion - working on this at home. Wife  would be HCPOA.  Seat belt use discussed Sunscreen use discussed, no changing moles  Smoker - 1 ppd longterm smoker (52 yrs) - has chantix rx at pharmacy Alcohol - none  Dentist - has dentures - doesn't see dentist Eye exam yearly   Caffeine: 1 cup coffee, 1/2 gallon unsweet tea, 2 soda/day Lives with wife and 1 dog, 6 cats outside. Occ: Long distance truck driver Started seeking care when received medicare. H/o noncompliance     Relevant past medical, surgical, family and social history reviewed and updated as indicated. Interim medical history since our last visit reviewed. Allergies and medications reviewed and updated. Outpatient Medications Prior to Visit  Medication Sig Dispense Refill  . albuterol (PROVENTIL HFA;VENTOLIN HFA) 108 (90 Base) MCG/ACT inhaler Inhale 2 puffs into the lungs every 6 (six) hours as needed for wheezing or shortness of breath. 1 Inhaler 3  . amiodarone (PACERONE) 200 MG tablet TAKE 1 TABLET(200 MG) BY MOUTH TWICE DAILY 180 tablet 3  . amLODipine (NORVASC) 5 MG tablet TAKE 1 TABLET(5 MG) BY MOUTH DAILY AS NEEDED 90 tablet 3  . Coenzyme Q10 (COQ-10 PO) Take 1 tablet by mouth daily.    Marland Kitchen diltiazem (CARDIZEM) 30 MG tablet TAKE 1 TABLET BY MOUTH EVERY DAY AS DIRECTED AS NEEDED FOR FAST HEART RATE 30 tablet 4  . diphenoxylate-atropine (LOMOTIL) 2.5-0.025 MG tablet TAKE 1 TABLET BY MOUTH TWICE DAILY AS NEEDED 20 tablet 1  . fish oil-omega-3 fatty acids 1000 MG capsule Take 2  g by mouth daily.     . furosemide (LASIX) 20 MG tablet TAKE 1 TABLET BY MOUTH AS NEEDED FOR FLUID 90 tablet 0  . gabapentin (NEURONTIN) 600 MG tablet Take 1 tablet (600 mg total) by mouth 4 (four) times daily as needed. 120 tablet 6  . guaifenesin (HUMIBID E) 400 MG TABS tablet Take 1 tablet (400 mg total) by mouth every 6 (six) hours as needed (mucous congestion). 84 tablet 1  . HYDROcodone-acetaminophen (NORCO) 10-325 MG tablet Take 1 tablet by mouth every 8 (eight) hours as needed for up  to 30 days for severe pain. 90 tablet 0  . metFORMIN (GLUCOPHAGE-XR) 500 MG 24 hr tablet Take 2 tablets (1,000 mg total) by mouth 2 (two) times daily. 360 tablet 3  . methocarbamol (ROBAXIN) 500 MG tablet Take 1 tablet (500 mg total) by mouth 2 (two) times daily. 180 tablet 1  . nitroGLYCERIN (NITROLINGUAL) 0.4 MG/SPRAY spray USE 1 SPRAY AS DIRECTED EVERY 5 MINUTES AS NEEDED 4.9 g 1  . omeprazole (PRILOSEC) 40 MG capsule TAKE 1 CAPSULE BY MOUTH EVERY DAY 90 capsule 0  . ondansetron (ZOFRAN) 4 MG tablet Take 1 tablet (4 mg total) by mouth every 8 (eight) hours as needed for nausea or vomiting. 20 tablet 0  . tamsulosin (FLOMAX) 0.4 MG CAPS capsule Take 1 capsule (0.4 mg total) by mouth 2 (two) times daily. 60 capsule 11  . tiotropium (SPIRIVA HANDIHALER) 18 MCG inhalation capsule Place 1 capsule (18 mcg total) into inhaler and inhale daily. 30 capsule 6  . varenicline (CHANTIX CONTINUING MONTH PAK) 1 MG tablet Take 1 tablet (1 mg total) by mouth 2 (two) times daily. 60 tablet 1  . varenicline (CHANTIX STARTING MONTH PAK) 0.5 MG X 11 & 1 MG X 42 tablet Take one 0.5 mg tablet by mouth once daily for 3 days, then increase to one 0.5 mg tablet twice daily for 4 days, then increase to one 1 mg tablet twice daily. 53 tablet 0  . vitamin B-12 (CYANOCOBALAMIN) 1000 MCG tablet Take 1 tablet (1,000 mcg total) by mouth daily.    Marland Kitchen warfarin (COUMADIN) 5 MG tablet TAKE AS DIRECTED BY COUMADIN CLINIC.Marland Kitchen 100 tablet 0  . ferrous sulfate 325 (65 FE) MG tablet Take 1 tablet (325 mg total) by mouth daily.    . metoprolol tartrate (LOPRESSOR) 25 MG tablet Take 1 tablet (25 mg total) by mouth as directed. Once daily at 5 pm 90 tablet 3   No facility-administered medications prior to visit.      Per HPI unless specifically indicated in ROS section below Review of Systems Objective:    BP 110/76 (BP Location: Left Arm, Patient Position: Sitting)   Pulse 69   Temp (!) 97.5 F (36.4 C) (Oral)   Ht '5\' 7"'$  (1.702 m)    Wt 226 lb (102.5 kg)   BMI 35.40 kg/m   Wt Readings from Last 3 Encounters:  04/21/19 226 lb (102.5 kg)  04/18/19 226 lb 8 oz (102.7 kg)  03/18/19 228 lb 13.4 oz (103.8 kg)     Physical exam: Gen: alert, NAD, not ill appearing Pulm: speaks in complete sentences without increased work of breathing Psych: normal mood, normal thought content      Results for orders placed or performed in visit on 04/20/19  VITAMIN D 25 Hydroxy (Vit-D Deficiency, Fractures)  Result Value Ref Range   VITD 18.90 (L) 30.00 - 100.00 ng/mL  Vitamin B12  Result Value Ref Range  Vitamin B-12 803 211 - 911 pg/mL  Comprehensive metabolic panel  Result Value Ref Range   Sodium 140 135 - 145 mEq/L   Potassium 4.4 3.5 - 5.1 mEq/L   Chloride 104 96 - 112 mEq/L   CO2 31 19 - 32 mEq/L   Glucose, Bld 119 (H) 70 - 99 mg/dL   BUN 12 6 - 23 mg/dL   Creatinine, Ser 0.92 0.40 - 1.50 mg/dL   Total Bilirubin 0.4 0.2 - 1.2 mg/dL   Alkaline Phosphatase 69 39 - 117 U/L   AST 16 0 - 37 U/L   ALT 13 0 - 53 U/L   Total Protein 6.1 6.0 - 8.3 g/dL   Albumin 3.5 3.5 - 5.2 g/dL   Calcium 8.6 8.4 - 10.5 mg/dL   GFR 80.21 >60.00 mL/min  Hemoglobin A1c  Result Value Ref Range   Hgb A1c MFr Bld 6.7 (H) 4.6 - 6.5 %  CBC with Differential/Platelet  Result Value Ref Range   WBC 7.5 4.0 - 10.5 K/uL   RBC 4.53 4.22 - 5.81 Mil/uL   Hemoglobin 14.6 13.0 - 17.0 g/dL   HCT 43.2 39.0 - 52.0 %   MCV 95.3 78.0 - 100.0 fl   MCHC 33.8 30.0 - 36.0 g/dL   RDW 13.9 11.5 - 15.5 %   Platelets 210.0 150.0 - 400.0 K/uL   Neutrophils Relative % 70.4 43.0 - 77.0 %   Lymphocytes Relative 20.7 12.0 - 46.0 %   Monocytes Relative 5.2 3.0 - 12.0 %   Eosinophils Relative 3.0 0.0 - 5.0 %   Basophils Relative 0.7 0.0 - 3.0 %   Neutro Abs 5.3 1.4 - 7.7 K/uL   Lymphs Abs 1.6 0.7 - 4.0 K/uL   Monocytes Absolute 0.4 0.1 - 1.0 K/uL   Eosinophils Absolute 0.2 0.0 - 0.7 K/uL   Basophils Absolute 0.1 0.0 - 0.1 K/uL  Lipid panel  Result Value Ref  Range   Cholesterol 175 0 - 200 mg/dL   Triglycerides 159.0 (H) 0.0 - 149.0 mg/dL   HDL 35.50 (L) >39.00 mg/dL   VLDL 31.8 0.0 - 40.0 mg/dL   LDL Cholesterol 108 (H) 0 - 99 mg/dL   Total CHOL/HDL Ratio 5    NonHDL 139.75   Microalbumin / creatinine urine ratio  Result Value Ref Range   Microalb, Ur 1.3 0.0 - 1.9 mg/dL   Creatinine,U 86.5 mg/dL   Microalb Creat Ratio 1.5 0.0 - 30.0 mg/g  Urinalysis Dipstick  Result Value Ref Range   Color, UA yellow    Clarity, UA clear    Glucose, UA Negative Negative   Bilirubin, UA negative    Ketones, UA negative    Spec Grav, UA 1.020 1.010 - 1.025   Blood, UA negative    pH, UA 6.0 5.0 - 8.0   Protein, UA Negative Negative   Urobilinogen, UA 0.2 0.2 or 1.0 E.U./dL   Nitrite, UA negative    Leukocytes, UA Negative Negative   Appearance     Odor     *Note: Due to a large number of results and/or encounters for the requested time period, some results have not been displayed. A complete set of results can be found in Results Review.   Assessment & Plan:   Problem List Items Addressed This Visit    Vitamin D insufficiency    Levels low - rec restart 1000 IU daily.       Vitamin B12 deficiency    Continue b12 replacement.  Type 2 diabetes, uncontrolled, with neuropathy (HCC)    Chronic, stable. Continue metformin XR.       Relevant Medications   pravastatin (PRAVACHOL) 20 MG tablet   Tobacco abuse    Continue to encourage cessation. 50+ PY hx. Needs to pick up chantix to try.      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HCC)   Relevant Medications   pravastatin (PRAVACHOL) 20 MG tablet   Thoracic aortic atherosclerosis (HCC)   Relevant Medications   pravastatin (PRAVACHOL) 20 MG tablet   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HCC)   Relevant Medications   pravastatin (PRAVACHOL) 20 MG tablet   Sick sinus syndrome (HCC)   Relevant Medications   pravastatin (PRAVACHOL) 20 MG tablet   Prostate cancer ([place])    Appreciate onc/uro care.  Has completed prostate radiation treatments.       Osteoarthritis (Chronic)   Obesity, Class I, BMI 30.0-34.9 (see actual BMI)   Nocturia    Nocturia despite flomax 0.'8mg'$  nightly. Also endorses daytime urge incontinence and leaking. Avoid antimuscarinics given already endorses dry mouth. Will price out myrbetriq.       Iron deficiency anemia    Anemia has resolved on replacement. Suggested maintenance iron MWF. Will send iFOB again today.       Relevant Medications   ferrous sulfate 325 (65 FE) MG tablet   Other Relevant Orders   Fecal occult blood, imunochemical   Hyperlipidemia, unspecified    Chronic, off statin. Intolerance to simvastatin in the past. Agrees to trial low potency statin in setting of DM. Will send pravastatin '20mg'$  daily, discussed option to take MWF if myalgias develop. The 10-year ASCVD risk score Mikey Bussing DC Brooke Bonito., et al., 2013) is: 42.5%   Values used to calculate the score:     Age: 43 years     Sex: Male     Is Non-Hispanic African American: No     Diabetic: Yes     Tobacco smoker: Yes     Systolic Blood Pressure: 161 mmHg     Is BP treated: Yes     HDL Cholesterol: 35.5 mg/dL     Total Cholesterol: 175 mg/dL       Relevant Medications   pravastatin (PRAVACHOL) 20 MG tablet   Fatty pancreas   Diabetic polyneuropathy (HCC) (Chronic)    Continue high dose gabapentin '600mg'$  QID.       Relevant Medications   pravastatin (PRAVACHOL) 20 MG tablet   Current use of long term anticoagulation (Coumadin) (Chronic)   COPD mixed type (HCC)   Atrial fibrillation (HCC)    Continue coumadin, amiodarone.       Relevant Medications   pravastatin (PRAVACHOL) 20 MG tablet   Advanced care planning/counseling discussion - Primary    Advanced directive discussion - working on this at home. Wife would be HCPOA. New packet mailed today.       AAA (abdominal aortic aneurysm) without rupture (HCC)   Relevant Medications   pravastatin (PRAVACHOL) 20 MG tablet        Meds ordered this encounter  Medications  . Cholecalciferol (VITAMIN D3) 25 MCG (1000 UT) CAPS    Sig: Take 1 capsule (1,000 Units total) by mouth daily.    Dispense:  30 capsule  . ferrous sulfate 325 (65 FE) MG tablet    Sig: Take 1 tablet (325 mg total) by mouth every Monday, Wednesday, and Friday.  . pravastatin (PRAVACHOL) 20 MG tablet    Sig: Take 1 tablet (20 mg total) by mouth daily.  Dispense:  90 tablet    Refill:  3  . mirabegron ER (MYRBETRIQ) 25 MG TB24 tablet    Sig: Take 1 tablet (25 mg total) by mouth daily.    Dispense:  30 tablet    Refill:  1    For urinary urgency and incontinence   Orders Placed This Encounter  Procedures  . Fecal occult blood, imunochemical    Standing Status:   Future    Standing Expiration Date:   04/20/2020   Patient Instructions  We will mail you stool kit.  Advanced directive packet mailed today.  Start vitamin D3 1000 units daily.  Start pravastatin '20mg'$  nightly, if trouble with muscle aches, decrease to twice a week.  Trial myrbetriq for urinary issues. Price out at pharmacy.  Return in 3 months for follow up visit.    Follow up plan: Return in about 3 months (around 07/22/2019) for follow up visit.  Ria Bush, MD

## 2019-04-22 ENCOUNTER — Institutional Professional Consult (permissible substitution): Payer: Medicare Other | Admitting: Internal Medicine

## 2019-04-22 DIAGNOSIS — R351 Nocturia: Secondary | ICD-10-CM | POA: Insufficient documentation

## 2019-04-22 NOTE — Assessment & Plan Note (Signed)
Continue coumadin, amiodarone.

## 2019-04-22 NOTE — Assessment & Plan Note (Signed)
Chronic, stable. Continue metformin XR.

## 2019-04-22 NOTE — Assessment & Plan Note (Signed)
Continue b12 replacement.  

## 2019-04-22 NOTE — Assessment & Plan Note (Signed)
Appreciate onc/uro care. Has completed prostate radiation treatments.

## 2019-04-22 NOTE — Assessment & Plan Note (Addendum)
Anemia has resolved on replacement. Suggested maintenance iron MWF. Will send iFOB again today.

## 2019-04-22 NOTE — Assessment & Plan Note (Signed)
Levels low - rec restart 1000 IU daily.

## 2019-04-22 NOTE — Assessment & Plan Note (Signed)
Nocturia despite flomax 0.8mg  nightly. Also endorses daytime urge incontinence and leaking. Avoid antimuscarinics given already endorses dry mouth. Will price out myrbetriq.

## 2019-04-22 NOTE — Assessment & Plan Note (Signed)
Continue to encourage cessation. 50+ PY hx. Needs to pick up chantix to try.

## 2019-04-22 NOTE — Assessment & Plan Note (Signed)
Continue high dose gabapentin 600mg  QID.

## 2019-04-22 NOTE — Assessment & Plan Note (Signed)
Chronic, off statin. Intolerance to simvastatin in the past. Agrees to trial low potency statin in setting of DM. Will send pravastatin 20mg  daily, discussed option to take MWF if myalgias develop. The 10-year ASCVD risk score Mikey Bussing DC Brooke Bonito., et al., 2013) is: 42.5%   Values used to calculate the score:     Age: 73 years     Sex: Male     Is Non-Hispanic African American: No     Diabetic: Yes     Tobacco smoker: Yes     Systolic Blood Pressure: 915 mmHg     Is BP treated: Yes     HDL Cholesterol: 35.5 mg/dL     Total Cholesterol: 175 mg/dL

## 2019-05-01 ENCOUNTER — Other Ambulatory Visit: Payer: Self-pay | Admitting: Cardiovascular Disease

## 2019-05-12 ENCOUNTER — Other Ambulatory Visit: Payer: Self-pay | Admitting: Family Medicine

## 2019-05-16 ENCOUNTER — Other Ambulatory Visit: Payer: Self-pay | Admitting: Family Medicine

## 2019-05-16 ENCOUNTER — Telehealth: Payer: Self-pay

## 2019-05-16 DIAGNOSIS — G894 Chronic pain syndrome: Secondary | ICD-10-CM

## 2019-05-16 NOTE — Telephone Encounter (Signed)

## 2019-05-16 NOTE — Telephone Encounter (Signed)
Name of Medication: Hydrocodone apap 10-325 mg Name of Pharmacy:[facility] [address]  Last Fill or Written Date and Quantity: #90 on 04/18/19 Last Office Visit and Type: 04/21/19 annual Next Office Visit and Type: none scheduled Last Controlled Substance Agreement Date:07/06/17 Last UDS:07/06/17

## 2019-05-18 ENCOUNTER — Ambulatory Visit (INDEPENDENT_AMBULATORY_CARE_PROVIDER_SITE_OTHER): Payer: Medicare Other

## 2019-05-18 ENCOUNTER — Other Ambulatory Visit: Payer: Self-pay

## 2019-05-18 DIAGNOSIS — I4891 Unspecified atrial fibrillation: Secondary | ICD-10-CM | POA: Diagnosis not present

## 2019-05-18 DIAGNOSIS — Z5181 Encounter for therapeutic drug level monitoring: Secondary | ICD-10-CM

## 2019-05-18 LAB — POCT INR: INR: 5 — AB (ref 2.0–3.0)

## 2019-05-18 MED ORDER — HYDROCODONE-ACETAMINOPHEN 10-325 MG PO TABS: 1.0000 | ORAL_TABLET | Freq: Three times a day (TID) | ORAL | 0 refills | Status: DC | PRN

## 2019-05-18 NOTE — Telephone Encounter (Signed)
Eprescribed.

## 2019-05-18 NOTE — Patient Instructions (Signed)
Please skip coumadin tonight, tomorrow & Friday, then e dosage of 1 tablet every day except 1/2 resume dosage of tablet on Mondays, Wednesdays and Fridays.  Recheck INR in 4 weeks.

## 2019-06-01 ENCOUNTER — Other Ambulatory Visit: Payer: Self-pay | Admitting: Family Medicine

## 2019-06-08 ENCOUNTER — Other Ambulatory Visit: Payer: Self-pay | Admitting: Family Medicine

## 2019-06-08 NOTE — Telephone Encounter (Signed)
Gabapentin Last filled:  05/11/19, #120 Last OV:  04/21/19, f/u Next OV:  none

## 2019-06-13 ENCOUNTER — Telehealth: Payer: Self-pay

## 2019-06-13 ENCOUNTER — Other Ambulatory Visit: Payer: Self-pay | Admitting: Family Medicine

## 2019-06-13 DIAGNOSIS — C61 Malignant neoplasm of prostate: Secondary | ICD-10-CM | POA: Diagnosis not present

## 2019-06-13 DIAGNOSIS — G894 Chronic pain syndrome: Secondary | ICD-10-CM

## 2019-06-13 NOTE — Progress Notes (Signed)
I reviewed health advisor's note, was available for consultation, and agree with documentation and plan.  

## 2019-06-13 NOTE — Telephone Encounter (Signed)
Name of Medication: Hydrocodone-APAP Name of Pharmacy: [facility] [address] Last Fill or Written Date and Quantity: 05/18/19, #90 Last Office Visit and Type: 04/21/19, f/u Next Office Visit and Type: none Last Controlled Substance Agreement Date: 07/06/17 Last UDS: 07/06/17
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
## 2019-06-13 NOTE — Telephone Encounter (Signed)

## 2019-06-14 ENCOUNTER — Other Ambulatory Visit: Payer: Self-pay | Admitting: Family Medicine

## 2019-06-14 ENCOUNTER — Telehealth (INDEPENDENT_AMBULATORY_CARE_PROVIDER_SITE_OTHER): Payer: Medicare Other | Admitting: Cardiovascular Disease

## 2019-06-14 ENCOUNTER — Encounter: Payer: Self-pay | Admitting: Cardiovascular Disease

## 2019-06-14 ENCOUNTER — Other Ambulatory Visit: Payer: Self-pay

## 2019-06-14 VITALS — BP 134/68 | HR 42 | Ht 72.0 in | Wt 229.0 lb

## 2019-06-14 DIAGNOSIS — R001 Bradycardia, unspecified: Secondary | ICD-10-CM

## 2019-06-14 DIAGNOSIS — E782 Mixed hyperlipidemia: Secondary | ICD-10-CM

## 2019-06-14 DIAGNOSIS — I495 Sick sinus syndrome: Secondary | ICD-10-CM

## 2019-06-14 DIAGNOSIS — I10 Essential (primary) hypertension: Secondary | ICD-10-CM | POA: Diagnosis not present

## 2019-06-14 DIAGNOSIS — I959 Hypotension, unspecified: Secondary | ICD-10-CM | POA: Diagnosis not present

## 2019-06-14 DIAGNOSIS — J432 Centrilobular emphysema: Secondary | ICD-10-CM | POA: Diagnosis not present

## 2019-06-14 DIAGNOSIS — I4891 Unspecified atrial fibrillation: Secondary | ICD-10-CM | POA: Diagnosis not present

## 2019-06-14 DIAGNOSIS — I77810 Thoracic aortic ectasia: Secondary | ICD-10-CM | POA: Diagnosis not present

## 2019-06-14 DIAGNOSIS — I2721 Secondary pulmonary arterial hypertension: Secondary | ICD-10-CM | POA: Diagnosis not present

## 2019-06-14 MED ORDER — HYDROCODONE-ACETAMINOPHEN 10-325 MG PO TABS: 1.0000 | ORAL_TABLET | Freq: Three times a day (TID) | ORAL | 0 refills | Status: DC | PRN

## 2019-06-14 NOTE — Patient Instructions (Addendum)
We will order a zio monitor for bradycardia, atrial flutter   Medication Instructions:  Hold the amiodarone  Continue metoprolol and diltiazem as needed for elevated heart rates  If you need a refill on your cardiac medications before your next appointment, please call your pharmacy.    Lab work: No new labs needed   If you have labs (blood work) drawn today and your tests are completely normal, you will receive your results only by: Marland Kitchen MyChart Message (if you have MyChart) OR . A paper copy in the mail If you have any lab test that is abnormal or we need to change your treatment, we will call you to review the results.   Testing/Procedures: No new testing needed   Follow-Up: At [facility], you and your health needs are our priority.  As part of our continuing mission to provide you with exceptional heart care, we have created designated Provider Care Teams.  These Care Teams include your primary Cardiologist (physician) and Advanced Practice Providers (APPs -  Physician Assistants and Nurse Practitioners) who all work together to provide you with the care you need, when you need it.  . You will need a follow up appointment in 12 months .   Please call our office 2 months in advance to schedule this appointment.    . Providers on your designated Care Team:   . Murray Hodgkins, NP . Christell Faith, PA-C . Marrianne Mood, PA-C  Any Other Special Instructions Will Be Listed Below (If Applicable).  For educational health videos Log in to : [email or web address] Or : [email or web address], password : [email or web address]

## 2019-06-14 NOTE — Telephone Encounter (Signed)
Eprescribed.

## 2019-06-15 ENCOUNTER — Ambulatory Visit (INDEPENDENT_AMBULATORY_CARE_PROVIDER_SITE_OTHER): Payer: Medicare Other

## 2019-06-15 ENCOUNTER — Other Ambulatory Visit: Payer: Self-pay

## 2019-06-15 DIAGNOSIS — I4891 Unspecified atrial fibrillation: Secondary | ICD-10-CM

## 2019-06-15 DIAGNOSIS — R001 Bradycardia, unspecified: Secondary | ICD-10-CM | POA: Diagnosis not present

## 2019-06-15 DIAGNOSIS — Z5181 Encounter for therapeutic drug level monitoring: Secondary | ICD-10-CM | POA: Diagnosis not present

## 2019-06-15 LAB — POCT INR: INR: 3 (ref 2.0–3.0)

## 2019-06-15 NOTE — Patient Instructions (Signed)
Please continue dosage of 1 tablet every day except 1/2 resume dosage of tablet on Mondays, Wednesdays and Fridays.  Recheck INR in 5 weeks.

## 2019-07-06 ENCOUNTER — Other Ambulatory Visit: Payer: Self-pay | Admitting: Family Medicine

## 2019-07-06 DIAGNOSIS — G894 Chronic pain syndrome: Secondary | ICD-10-CM

## 2019-07-07 DIAGNOSIS — I484 Atypical atrial flutter: Secondary | ICD-10-CM | POA: Diagnosis not present

## 2019-07-07 MED ORDER — HYDROCODONE-ACETAMINOPHEN 10-325 MG PO TABS: 1.0000 | ORAL_TABLET | Freq: Three times a day (TID) | ORAL | 0 refills | Status: DC | PRN

## 2019-07-07 NOTE — Telephone Encounter (Signed)
Eprescribed.

## 2019-07-07 NOTE — Telephone Encounter (Signed)
Name of Medication: Hydrocodone-APAP Name of Pharmacy: [facility] Last Fill or Written Date and Quantity: 06/14/19, #90 Last Office Visit and Type: 04/21/19, CPE Pt 2 Next Office Visit and Type: none Last Controlled Substance Agreement Date: 07/06/17 Last UDS: 07/06/17

## 2019-07-08 ENCOUNTER — Other Ambulatory Visit: Payer: Self-pay | Admitting: Family Medicine

## 2019-07-08 ENCOUNTER — Other Ambulatory Visit: Payer: Self-pay | Admitting: Cardiovascular Disease

## 2019-07-08 DIAGNOSIS — Z8546 Personal history of malignant neoplasm of prostate: Secondary | ICD-10-CM | POA: Diagnosis not present

## 2019-07-08 DIAGNOSIS — R3915 Urgency of urination: Secondary | ICD-10-CM | POA: Diagnosis not present

## 2019-07-08 NOTE — Telephone Encounter (Signed)
Please review for refill. Thank you! 

## 2019-07-18 ENCOUNTER — Telehealth: Payer: Self-pay | Admitting: *Deleted

## 2019-07-18 NOTE — Telephone Encounter (Signed)
Reviewed results and recommendations with patient and he verbalized understanding but was very adamant that he would not see Dr. Caryl Comes and would not even consider pacemaker. Advised that I would make provider aware and if he had any further questions to please give [place] a call back.

## 2019-07-18 NOTE — Telephone Encounter (Signed)
-----   Message from Minna Merritts, MD sent at 07/17/2019  4:25 PM EDT ----- Event monitor Predominant heart rate in NSR is 40s to 55 bpm Atrial Fibrillation occurred (32% burden),  Avg heart rate in atrial fibrillation is 60 to 90 bpm Previously he reported having sx when rate in low 40s Would recommend he have appt with dr. Caryl Comes for consideration of pacer if he is having symptoms when rate is low

## 2019-07-19 ENCOUNTER — Other Ambulatory Visit: Payer: Self-pay | Admitting: Family Medicine

## 2019-07-20 ENCOUNTER — Other Ambulatory Visit: Payer: Self-pay

## 2019-07-20 ENCOUNTER — Ambulatory Visit (INDEPENDENT_AMBULATORY_CARE_PROVIDER_SITE_OTHER): Payer: Medicare Other

## 2019-07-20 DIAGNOSIS — Z5181 Encounter for therapeutic drug level monitoring: Secondary | ICD-10-CM

## 2019-07-20 DIAGNOSIS — I4891 Unspecified atrial fibrillation: Secondary | ICD-10-CM | POA: Diagnosis not present

## 2019-07-20 LAB — POCT INR: INR: 3.2 — AB (ref 2.0–3.0)

## 2019-07-20 NOTE — Patient Instructions (Signed)
Please skip coumadin tonight, then continue dosage of 1 tablet every day except 1/2 resume dosage of tablet on Mondays, Wednesdays and Fridays.  Recheck INR in 6 weeks.

## 2019-07-20 NOTE — Telephone Encounter (Signed)
Spoke w/ pt and his wife today during INR check and asked them what their concerns were w/ consideration of pacemaker.  Pt states that his concern is not necessarily w/ the pacemaker itself, but w/ general anesthesia.  He reports that the last time he "went under", he had difficulty waking up and that he was told that he "died" before waking up. He reports heart issues since he was 73 years old; his father had heart problems and "died 3 times" while he had a procedure.  He states that he is 73 years old and that a pacer might help him get to 69, but will not necessarily improve his quality of life.  He states that he has discussed this w/ his doctors for years and that "it is their responsibility to keep him alive, not a pacemaker's".   Pt's wife is in agreement that they do not want pt to have any procedure that would involve general anesthesia.

## 2019-07-25 ENCOUNTER — Ambulatory Visit (INDEPENDENT_AMBULATORY_CARE_PROVIDER_SITE_OTHER)
Admission: RE | Admit: 2019-07-25 | Discharge: 2019-07-25 | Disposition: A | Discharge status: Home / Self Care | Payer: Medicare Other | Source: Ambulatory Visit | Attending: Family Medicine | Admitting: Family Medicine

## 2019-07-25 ENCOUNTER — Encounter: Payer: Self-pay | Admitting: Family Medicine

## 2019-07-25 ENCOUNTER — Ambulatory Visit (INDEPENDENT_AMBULATORY_CARE_PROVIDER_SITE_OTHER): Payer: Medicare Other | Admitting: Family Medicine

## 2019-07-25 ENCOUNTER — Other Ambulatory Visit: Payer: Self-pay

## 2019-07-25 VITALS — BP 122/60 | HR 54 | Temp 97.6°F | Ht 67.0 in | Wt 240.1 lb

## 2019-07-25 DIAGNOSIS — W19XXXA Unspecified fall, initial encounter: Secondary | ICD-10-CM

## 2019-07-25 DIAGNOSIS — S42031A Displaced fracture of lateral end of right clavicle, initial encounter for closed fracture: Secondary | ICD-10-CM | POA: Diagnosis not present

## 2019-07-25 DIAGNOSIS — M79671 Pain in right foot: Secondary | ICD-10-CM | POA: Diagnosis not present

## 2019-07-25 DIAGNOSIS — S99921A Unspecified injury of right foot, initial encounter: Secondary | ICD-10-CM | POA: Diagnosis not present

## 2019-07-25 DIAGNOSIS — M25511 Pain in right shoulder: Secondary | ICD-10-CM | POA: Diagnosis not present

## 2019-07-25 DIAGNOSIS — M7989 Other specified soft tissue disorders: Secondary | ICD-10-CM | POA: Diagnosis not present

## 2019-07-25 HISTORY — DX: Displaced fracture of lateral end of right clavicle, initial encounter for closed fracture: S42.031A

## 2019-07-25 MED ORDER — HYDROCODONE-ACETAMINOPHEN 10-325 MG PO TABS: 1.0000 | ORAL_TABLET | Freq: Three times a day (TID) | ORAL | 0 refills | Status: DC | PRN

## 2019-07-25 NOTE — Assessment & Plan Note (Addendum)
Fall at home - possible syncope from coughing fit after aspirating soda. Resultant R clavicle fracture - see below.

## 2019-07-25 NOTE — Assessment & Plan Note (Signed)
Overall reassuring xrays. Supportive care reviewed - leg elevation, encouraged increased water intake and limiting sodas and sodium.

## 2019-07-25 NOTE — Progress Notes (Signed)
This visit was conducted in person.  BP 122/60 (BP Location: Left Arm, Patient Position: Sitting, Cuff Size: Large)    Pulse (!) 54    Temp 97.6 F (36.4 C) (Temporal)    Ht 5\' 7"  (1.702 m)    Wt 240 lb 2 oz (108.9 kg)    SpO2 94%    BMI 37.61 kg/m    CC: fall Subjective:    Patient ID: [name], male    DOB: 1946-02-23, 73 y.o.   MRN: [id number]  HPI: [name] is a 73 y.o. male presenting on 07/25/2019 for Fall (Pt fell at home on 07/22/19.  Has injuries to left shoulder and left foot. Pt accompanied by wife, [name]. )   DOI: 07/22/2019 [place] out of kitchen chair while drinking pepsi - aspirated some pepsi and started coughing. Thinks passed out, landed on R foot and R shoulder. Limited ROM to shoulder since fall - significant pain cannot raise shoulder past midline.   Treating pain with salon pas as well as chronic pain regimen of gabapentin 600mg  QID and hydrocodone 10/325mg  TID, as well as methocarbamol 500mg  BID. Currently taking hydrocodone 2 tab TID - without benefit of pain.      Relevant past medical, surgical, family and social history reviewed and updated as indicated. Interim medical history since our last visit reviewed. Allergies and medications reviewed and updated. Outpatient Medications Prior to Visit  Medication Sig Dispense Refill   albuterol (VENTOLIN HFA) 108 (90 Base) MCG/ACT inhaler INHALE 2 PUFFS BY MOUTH EVERY 6 HOURS AS NEEDED FOR WHEEZING OR SHORTNESS OF BREATH 8.5 g 3   amiodarone (PACERONE) 200 MG tablet TAKE 1 TABLET(200 MG) BY MOUTH TWICE DAILY 180 tablet 3   amLODipine (NORVASC) 5 MG tablet TAKE 1 TABLET(5 MG) BY MOUTH DAILY AS NEEDED 90 tablet 3   Cholecalciferol (VITAMIN D3) 25 MCG (1000 UT) CAPS Take 1 capsule (1,000 Units total) by mouth daily. 30 capsule    Coenzyme Q10 (COQ-10 PO) Take 1 tablet by mouth daily.     diltiazem (CARDIZEM) 30 MG tablet TAKE 1 TABLET BY MOUTH EVERY DAY AS DIRECTED AS NEEDED FOR FAST HEART RATE 30 tablet 4    diphenoxylate-atropine (LOMOTIL) 2.5-0.025 MG tablet TAKE 1 TABLET BY MOUTH TWICE DAILY AS NEEDED 20 tablet 1   ferrous sulfate 325 (65 FE) MG tablet Take 1 tablet (325 mg total) by mouth every Monday, Wednesday, and Friday.     fish oil-omega-3 fatty acids 1000 MG capsule Take 2 g by mouth daily.      furosemide (LASIX) 20 MG tablet TAKE 1 TABLET BY MOUTH EVERY DAY AS NEEDED FOR FLUID 90 tablet 0   gabapentin (NEURONTIN) 600 MG tablet TAKE 1 TABLET(600 MG) BY MOUTH FOUR TIMES DAILY AS NEEDED 120 tablet 6   guaifenesin (HUMIBID E) 400 MG TABS tablet Take 1 tablet (400 mg total) by mouth every 6 (six) hours as needed (mucous congestion). 84 tablet 1   HYDROcodone-acetaminophen (NORCO) 10-325 MG tablet Take 1 tablet by mouth every 8 (eight) hours as needed for severe pain. 90 tablet 0   metFORMIN (GLUCOPHAGE-XR) 500 MG 24 hr tablet Take 2 tablets (1,000 mg total) by mouth 2 (two) times daily. 360 tablet 3   methocarbamol (ROBAXIN) 500 MG tablet Take 1 tablet (500 mg total) by mouth 2 (two) times daily. 180 tablet 1   metoprolol tartrate (LOPRESSOR) 25 MG tablet TAKE 1 TABLET(25 MG) BY MOUTH EVERY DAY AT 5 PM AS DIRECTED 90  tablet 0   MYRBETRIQ 25 MG TB24 tablet TAKE 1 TABLET(25 MG) BY MOUTH DAILY 30 tablet 1   nitroGLYCERIN (NITROLINGUAL) 0.4 MG/SPRAY spray USE 1 SPRAY AS DIRECTED EVERY 5 MINUTES AS NEEDED 4.9 g 1   omeprazole (PRILOSEC) 40 MG capsule TAKE 1 CAPSULE BY MOUTH EVERY DAY 90 capsule 1   ondansetron (ZOFRAN) 4 MG tablet Take 1 tablet (4 mg total) by mouth every 8 (eight) hours as needed for nausea or vomiting. 20 tablet 0   pravastatin (PRAVACHOL) 20 MG tablet Take 1 tablet (20 mg total) by mouth daily. 90 tablet 3   tamsulosin (FLOMAX) 0.4 MG CAPS capsule Take 1 capsule (0.4 mg total) by mouth 2 (two) times daily. 60 capsule 11   tiotropium (SPIRIVA HANDIHALER) 18 MCG inhalation capsule Place 1 capsule (18 mcg total) into inhaler and inhale daily. 30 capsule 6    varenicline (CHANTIX CONTINUING MONTH PAK) 1 MG tablet Take 1 tablet (1 mg total) by mouth 2 (two) times daily. 60 tablet 1   varenicline (CHANTIX STARTING MONTH PAK) 0.5 MG X 11 & 1 MG X 42 tablet Take one 0.5 mg tablet by mouth once daily for 3 days, then increase to one 0.5 mg tablet twice daily for 4 days, then increase to one 1 mg tablet twice daily. 53 tablet 0   vitamin B-12 (CYANOCOBALAMIN) 1000 MCG tablet Take 1 tablet (1,000 mcg total) by mouth daily.     warfarin (COUMADIN) 5 MG tablet TAKE AS DIRECTED BY COUMADIN CLINIC 100 tablet 0   No facility-administered medications prior to visit.      Per HPI unless specifically indicated in ROS section below Review of Systems Objective:    BP 122/60 (BP Location: Left Arm, Patient Position: Sitting, Cuff Size: Large)    Pulse (!) 54    Temp 97.6 F (36.4 C) (Temporal)    Ht 5\' 7"  (1.702 m)    Wt 240 lb 2 oz (108.9 kg)    SpO2 94%    BMI 37.61 kg/m   Wt Readings from Last 3 Encounters:  07/25/19 240 lb 2 oz (108.9 kg)  06/14/19 229 lb (103.9 kg)  04/21/19 226 lb (102.5 kg)    Physical Exam Vitals signs and nursing note reviewed.  Constitutional:      Appearance: Normal appearance. He is not ill-appearing.  Musculoskeletal:        General: Swelling, tenderness and signs of injury present.     Right lower leg: Edema (1+ pitting) present.     Left lower leg: Edema (tr pitting) present.     Comments: L shoulder WNL R shoulder limited active ROM in abduction past 90 degrees, does better with passive ROM. No pain with palpation of R upper arm. Reproducible tenderness with palpation of R anterior shoulder at [facility] joint into posterior R shoulder near trapezius mm.  L foot - mild edema R foot - marked edema distal to mid calf without significant point tenderness throughout foot  Skin:    Findings: Abrasion, bruising and ecchymosis present.     Comments: Bruising, ecchymoses to R shoulder and back as well as R foot into toes    Neurological:     Mental Status: He is alert.       Results for orders placed or performed in visit on 07/20/19  POCT INR  Result Value Ref Range   INR 3.2 (A) 2.0 - 3.0   *Note: Due to a large number of results and/or encounters for the requested  time period, some results have not been displayed. A complete set of results can be found in Results Review.   Lab Results  Component Value Date   HGBA1C 6.7 (H) 04/20/2019   DG Shoulder Right CLINICAL DATA:  Acute RIGHT shoulder pain following fall. Initial encounter.  EXAM: RIGHT SHOULDER - 2+ VIEW  COMPARISON:  12/09/2016 radiograph and 01/22/2018 MR  FINDINGS: A mildly comminuted distal clavicle fracture is noted with minimal distraction. There is no definite extension to the [facility] joint.  No other acute fracture, subluxation or dislocation identified.  AC and glenohumeral joint degenerative changes are again noted as well as cysts in the proximal RIGHT humerus.  IMPRESSION: Mildly comminuted distal clavicle fracture.  Electronically Signed   By: Margarette Canada M.D.   On: 07/25/2019 13:29   Assessment & Plan:   Problem List Items Addressed This Visit    Traumatic closed fracture of distal clavicle with minimal displacement, right, initial encounter - Primary    Xray showing distal clavicle fracture. Placed in sling. Reviewed acute breathrough pain regimen in setting of chronic pain. Treat with additional hydrocodone 10/325mg  TID x 10 days. Reviewed recommended work restrictions. 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 Will refer to ortho for f/u.       Relevant Orders   Ambulatory referral to Orthopedic Surgery   Right foot pain    Overall reassuring xrays. Supportive care reviewed - leg elevation, encouraged increased water intake and limiting sodas and sodium.       Relevant Orders   DG Foot Complete Right   Fall with injury    Fall at home - possible syncope from coughing fit after aspirating soda. Resultant R  clavicle fracture - see below.       Relevant Orders   DG Foot Complete Right   DG Shoulder Right (Completed)   Ambulatory referral to Orthopedic Surgery    Other Visit Diagnoses    Acute pain of right shoulder       Relevant Orders   DG Shoulder Right (Completed)       Meds ordered this encounter  Medications   HYDROcodone-acetaminophen (NORCO) 10-325 MG tablet    Sig: Take 1 tablet by mouth 3 (three) times daily as needed for severe pain.    Dispense:  30 tablet    Refill:  0    In addition to chronic hydrocodone pain regimen for acute pain after fall   Orders Placed This Encounter  Procedures   DG Foot Complete Right    Standing Status:   Future    Number of Occurrences:   1    Standing Expiration Date:   09/23/2020    Order Specific Question:   Reason for Exam (SYMPTOM  OR DIAGNOSIS REQUIRED)    Answer:   R foot pain at [place] after fall    Order Specific Question:   Preferred imaging location?    Answer:   [facility]    Order Specific Question:   Radiology Contrast Protocol - do NOT remove file path    Answer:   \[email or web address]   DG Shoulder Right    Standing Status:   Future    Number of Occurrences:   1    Standing Expiration Date:   09/23/2020    Order Specific Question:   Reason for Exam (SYMPTOM  OR DIAGNOSIS REQUIRED)    Answer:   R shoulder pain after fall 4d ago    Order Specific Question:   Preferred imaging location?  Answer:   [facility]    Order Specific Question:   Radiology Contrast Protocol - do NOT remove file path    Answer:   \[email or web address]   Ambulatory referral to Orthopedic Surgery    Referral Priority:   Routine    Referral Type:   Surgical    Referral Reason:   Specialty Services Required    Requested Specialty:   Orthopedic Surgery    Number of Visits Requested:   1    Follow up plan: No follow-ups on file.  Ria Bush,  MD

## 2019-07-25 NOTE — Patient Instructions (Addendum)
You have a distal clavicle fracture.  Try sling.  Hydrocodone prescribed to take 2 tablets three times daily for acute pain.  We will refer you to orthopedist for further evaluation.

## 2019-07-25 NOTE — Assessment & Plan Note (Signed)
Xray showing distal clavicle fracture. Placed in sling. Reviewed acute breathrough pain regimen in setting of chronic pain. Treat with additional hydrocodone 10/325mg  TID x 10 days. Reviewed recommended work restrictions. 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 Will refer to ortho for f/u.

## 2019-07-26 ENCOUNTER — Telehealth: Payer: Self-pay | Admitting: Family Medicine

## 2019-07-26 MED ORDER — HYDROCODONE-ACETAMINOPHEN 10-325 MG PO TABS: 2.0000 | ORAL_TABLET | Freq: Three times a day (TID) | ORAL | 0 refills | Status: DC | PRN

## 2019-07-26 NOTE — Telephone Encounter (Signed)
Patient's wife, [name], called. Patient's prescription for Hydrocodone was suppose to be changed according to the after visit summary  to 2 tablets 3 x a day.  [facility] [address] told patient the prescription was sent in for 1 tablet 3 x a day. Patient would like a new rx sent to [facility] for 2 tablets 3 x a day.

## 2019-07-26 NOTE — Telephone Encounter (Signed)
plz notify new sig sent to pharmacy 2 tab TID x 10 days. I clarified with pharmacy. don't take more than 2 at a time.

## 2019-07-27 DIAGNOSIS — S42034A Nondisplaced fracture of lateral end of right clavicle, initial encounter for closed fracture: Secondary | ICD-10-CM | POA: Diagnosis not present

## 2019-07-27 NOTE — Telephone Encounter (Signed)
Spoke with pt's wife, [name] (on dpr), relaying Dr. Synthia Innocent message.  Verbalizes understanding.

## 2019-07-29 ENCOUNTER — Inpatient Hospital Stay: Payer: Medicare Other

## 2019-08-04 ENCOUNTER — Other Ambulatory Visit: Payer: Self-pay | Admitting: Cardiovascular Disease

## 2019-08-05 ENCOUNTER — Ambulatory Visit: Payer: Medicare Other | Admitting: Radiation Oncology

## 2019-08-08 DIAGNOSIS — S42034A Nondisplaced fracture of lateral end of right clavicle, initial encounter for closed fracture: Secondary | ICD-10-CM | POA: Diagnosis not present

## 2019-08-09 ENCOUNTER — Other Ambulatory Visit: Payer: Self-pay | Admitting: Family Medicine

## 2019-08-09 DIAGNOSIS — G894 Chronic pain syndrome: Secondary | ICD-10-CM

## 2019-08-09 NOTE — Telephone Encounter (Signed)
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 There were x rays taken. The doctor said the fractured bone has not shifted, staying in place. It will take at least 6-8 weeks for healing. The pain is still constant and his 10-day prescription for hydrocodone is almost complete. He is requesting a prescription refill for 1 tablet 4 times daily if possible. Thank you.  Name of Medication: Hydrocodone Name of Pharmacy: [facility] [address] or Written Date and Quantity: 07-07-19 #90 and 07-26-19 #30 Last Office Visit and Type: 07-26-19 Next Office Visit and Type: No Future OV Last Controlled Substance Agreement Date: 02-03-18 Last UDS: N/A

## 2019-08-10 MED ORDER — HYDROCODONE-ACETAMINOPHEN 10-325 MG PO TABS: 1.0000 | ORAL_TABLET | Freq: Four times a day (QID) | ORAL | 0 refills | Status: DC | PRN

## 2019-08-10 NOTE — Telephone Encounter (Signed)
plz notify I have sent in QID dosing (increase from prior) due to recent clavicle fracture.

## 2019-08-12 NOTE — Telephone Encounter (Signed)
Spoke with pt's wife, [name] (on dpr), relaying Dr. G's message.  Verbalizes understanding.  

## 2019-08-31 ENCOUNTER — Other Ambulatory Visit: Payer: Self-pay

## 2019-08-31 ENCOUNTER — Ambulatory Visit (INDEPENDENT_AMBULATORY_CARE_PROVIDER_SITE_OTHER): Payer: Medicare Other

## 2019-08-31 DIAGNOSIS — I4891 Unspecified atrial fibrillation: Secondary | ICD-10-CM | POA: Diagnosis not present

## 2019-08-31 DIAGNOSIS — Z5181 Encounter for therapeutic drug level monitoring: Secondary | ICD-10-CM | POA: Diagnosis not present

## 2019-08-31 LAB — POCT INR: INR: 4.4 — AB (ref 2.0–3.0)

## 2019-09-06 ENCOUNTER — Other Ambulatory Visit: Payer: Self-pay | Admitting: Family Medicine

## 2019-09-06 DIAGNOSIS — G894 Chronic pain syndrome: Secondary | ICD-10-CM

## 2019-09-07 NOTE — Telephone Encounter (Signed)
Name of Medication: Hydrocodone-APAP Name of Pharmacy: [facility] Last Fill or Written Date and Quantity: 08/10/19, #120 Last Office Visit and Type: 07/25/19, acute Next Office Visit and Type: none Last Controlled Substance Agreement Date: 07/06/17 Last UDS: 07/06/17

## 2019-09-08 ENCOUNTER — Encounter: Payer: Self-pay | Admitting: Family Medicine

## 2019-09-09 ENCOUNTER — Other Ambulatory Visit: Payer: Self-pay

## 2019-09-09 ENCOUNTER — Inpatient Hospital Stay: Payer: Medicare Other | Attending: Radiation Oncology

## 2019-09-09 DIAGNOSIS — C61 Malignant neoplasm of prostate: Secondary | ICD-10-CM | POA: Insufficient documentation

## 2019-09-09 DIAGNOSIS — S42034A Nondisplaced fracture of lateral end of right clavicle, initial encounter for closed fracture: Secondary | ICD-10-CM | POA: Diagnosis not present

## 2019-09-09 LAB — PSA: Prostatic Specific Antigen: 0.02 ng/mL (ref 0.00–4.00)

## 2019-09-09 MED ORDER — HYDROCODONE-ACETAMINOPHEN 10-325 MG PO TABS: 1.0000 | ORAL_TABLET | Freq: Four times a day (QID) | ORAL | 0 refills | Status: DC | PRN

## 2019-09-09 NOTE — Telephone Encounter (Signed)
Eprescribed.

## 2019-09-09 NOTE — Telephone Encounter (Signed)
[name] (spouse) called checking on refill med [facility] number [phone]  Pt is almost out 6 pills left

## 2019-09-16 ENCOUNTER — Other Ambulatory Visit: Payer: Self-pay

## 2019-09-16 ENCOUNTER — Ambulatory Visit: Payer: Medicare Other | Admitting: Radiation Oncology

## 2019-09-19 ENCOUNTER — Ambulatory Visit: Payer: Medicare Other | Admitting: Radiation Oncology

## 2019-09-26 ENCOUNTER — Other Ambulatory Visit: Payer: Self-pay

## 2019-09-26 ENCOUNTER — Ambulatory Visit (INDEPENDENT_AMBULATORY_CARE_PROVIDER_SITE_OTHER): Payer: Medicare Other

## 2019-09-26 DIAGNOSIS — I4891 Unspecified atrial fibrillation: Secondary | ICD-10-CM

## 2019-09-26 DIAGNOSIS — Z5181 Encounter for therapeutic drug level monitoring: Secondary | ICD-10-CM | POA: Diagnosis not present

## 2019-09-26 LAB — POCT INR: INR: 4.7 — AB (ref 2.0–3.0)

## 2019-09-26 NOTE — Patient Instructions (Signed)
Please skip warfarin tonight & tomorrow, then START NEW DOSAGE of 1/2 tablet every day EXCEPT 1 TABLET ON WEDNESDAYS.  Recheck INR in 2 weeks.

## 2019-09-27 ENCOUNTER — Other Ambulatory Visit: Payer: Self-pay

## 2019-09-27 ENCOUNTER — Ambulatory Visit
Admission: RE | Admit: 2019-09-27 | Discharge: 2019-09-27 | Disposition: A | Discharge status: Home / Self Care | Payer: Medicare Other | Source: Ambulatory Visit | Attending: Radiation Oncology | Admitting: Radiation Oncology

## 2019-09-27 ENCOUNTER — Encounter: Payer: Self-pay | Admitting: Radiation Oncology

## 2019-09-27 VITALS — BP 134/107 | HR 52 | Temp 98.0°F | Wt 238.8 lb

## 2019-09-27 DIAGNOSIS — C61 Malignant neoplasm of prostate: Secondary | ICD-10-CM

## 2019-09-27 DIAGNOSIS — R5383 Other fatigue: Secondary | ICD-10-CM | POA: Diagnosis not present

## 2019-09-27 DIAGNOSIS — Z923 Personal history of irradiation: Secondary | ICD-10-CM | POA: Insufficient documentation

## 2019-09-27 NOTE — Progress Notes (Signed)
Radiation Oncology Follow up Note  Name: [name]   Date:   09/27/2019 MRN:  [id number] DOB: Mar 06, 1946    This 73 y.o. male presents to the clinic today for 17-month follow-up status post IMRT radiation therapy for Gleason 7 (4+3) adenocarcinoma the prostate.  REFERRING PROVIDER: Ria Bush, MD  HPI: Patient is a 73 year old male now out 5 months having completed IMRT radiation therapy for Gleason 7 (4+3 adenocarcinoma the prostate presenting with a PSA of 9.7.  Seen today in routine follow-up he is doing well.  He specifically denies any increased lower urinary tract symptoms diarrhea fatigue.  His most recent PSA is less than 0000000.  COMPLICATIONS OF TREATMENT: none  FOLLOW UP COMPLIANCE: keeps appointments   PHYSICAL EXAM:  BP (!) 134/107   Pulse (!) 52   Temp 98 F (36.7 C)   Wt 238 lb 12.8 oz (108.3 kg)   BMI 37.40 kg/m  Well-developed well-nourished patient in NAD. HEENT reveals PERLA, EOMI, discs not visualized.  Oral cavity is clear. No oral mucosal lesions are identified. Neck is clear without evidence of cervical or supraclavicular adenopathy. Lungs are clear to A&P. Cardiac examination is essentially unremarkable with regular rate and rhythm without murmur rub or thrill. Abdomen is benign with no organomegaly or masses noted. Motor sensory and DTR levels are equal and symmetric in the upper and lower extremities. Cranial nerves II through XII are grossly intact. Proprioception is intact. No peripheral adenopathy or edema is identified. No motor or sensory levels are noted. Crude visual fields are within normal range.  RADIOLOGY RESULTS: No current films to review  PLAN: Present time patient is an excellent biochemical control of his prostate cancer.  I am pleased with his overall progress.  I have asked to see him back in 6 months for follow-up with a PSA at that time.  Patient knows to call with any concerns.  I would like to take this opportunity to thank you  for allowing me to participate in the care of your patient.Noreene Filbert, MD

## 2019-10-05 ENCOUNTER — Other Ambulatory Visit: Payer: Self-pay | Admitting: Family Medicine

## 2019-10-05 DIAGNOSIS — G894 Chronic pain syndrome: Secondary | ICD-10-CM

## 2019-10-05 MED ORDER — HYDROCODONE-ACETAMINOPHEN 10-325 MG PO TABS: 1.0000 | ORAL_TABLET | Freq: Four times a day (QID) | ORAL | 0 refills | Status: DC | PRN

## 2019-10-05 NOTE — Telephone Encounter (Signed)
Last office visit 07/25/2019 for Fall & Clavicle Fx.  Last refilled 09/09/2019 for #120 with no refills.  UDS 07/06/2017/Contract 02/03/2018.  No future appointments with PCP.

## 2019-10-05 NOTE — Telephone Encounter (Signed)
ERx 

## 2019-10-10 ENCOUNTER — Other Ambulatory Visit: Payer: Self-pay

## 2019-10-10 ENCOUNTER — Other Ambulatory Visit: Payer: Self-pay | Admitting: Cardiovascular Disease

## 2019-10-10 ENCOUNTER — Ambulatory Visit (INDEPENDENT_AMBULATORY_CARE_PROVIDER_SITE_OTHER): Payer: Medicare Other

## 2019-10-10 DIAGNOSIS — I4891 Unspecified atrial fibrillation: Secondary | ICD-10-CM | POA: Diagnosis not present

## 2019-10-10 DIAGNOSIS — Z5181 Encounter for therapeutic drug level monitoring: Secondary | ICD-10-CM | POA: Diagnosis not present

## 2019-10-10 LAB — POCT INR: INR: 1.5 — AB (ref 2.0–3.0)

## 2019-10-10 NOTE — Telephone Encounter (Signed)
Refill Request.  

## 2019-10-10 NOTE — Patient Instructions (Signed)
Please take 1 tablet today, then resume dosage of 1/2 tablet every day EXCEPT 1 TABLET ON WEDNESDAYS.  Recheck INR in 2 weeks.

## 2019-10-13 ENCOUNTER — Other Ambulatory Visit: Payer: Self-pay

## 2019-10-13 MED ORDER — ALBUTEROL SULFATE HFA 108 (90 BASE) MCG/ACT IN AERS: INHALATION_SPRAY | RESPIRATORY_TRACT | 3 refills | Status: DC

## 2019-10-13 NOTE — Telephone Encounter (Signed)
Escribed

## 2019-10-17 ENCOUNTER — Encounter: Payer: Self-pay | Admitting: Family Medicine

## 2019-10-18 ENCOUNTER — Institutional Professional Consult (permissible substitution): Payer: Medicare Other | Admitting: Pulmonary Disease

## 2019-10-18 MED ORDER — METHOCARBAMOL 500 MG PO TABS: 500.0000 mg | ORAL_TABLET | Freq: Two times a day (BID) | ORAL | 1 refills | Status: DC

## 2019-10-18 NOTE — Telephone Encounter (Signed)
Robaxin Last rx:  02/08/19, #180/1 Last OV:  07/25/19, acute f/x Next OV:  none

## 2019-10-24 ENCOUNTER — Other Ambulatory Visit: Payer: Self-pay

## 2019-10-24 MED ORDER — WARFARIN SODIUM 5 MG PO TABS: ORAL_TABLET | ORAL | 0 refills | Status: DC

## 2019-10-31 ENCOUNTER — Telehealth: Payer: Self-pay

## 2019-10-31 MED ORDER — AMLODIPINE BESYLATE 5 MG PO TABS: ORAL_TABLET | ORAL | 1 refills | Status: DC

## 2019-10-31 NOTE — Telephone Encounter (Signed)
Requested Prescriptions   Signed Prescriptions Disp Refills  . amLODipine (NORVASC) 5 MG tablet 90 tablet 1    Sig: TAKE 1 TABLET(5 MG) BY MOUTH DAILY AS NEEDED    Authorizing Provider: Minna Merritts    Ordering User: Raelene Bott, BRANDY L

## 2019-11-01 ENCOUNTER — Encounter: Payer: Self-pay | Admitting: Pulmonary Disease

## 2019-11-01 ENCOUNTER — Ambulatory Visit (INDEPENDENT_AMBULATORY_CARE_PROVIDER_SITE_OTHER): Payer: Medicare Other | Admitting: Pulmonary Disease

## 2019-11-01 ENCOUNTER — Other Ambulatory Visit: Payer: Self-pay

## 2019-11-01 ENCOUNTER — Other Ambulatory Visit: Payer: Self-pay | Admitting: Cardiovascular Disease

## 2019-11-01 VITALS — BP 116/68 | HR 70 | Temp 97.3°F | Ht 73.0 in | Wt 244.6 lb

## 2019-11-01 DIAGNOSIS — J449 Chronic obstructive pulmonary disease, unspecified: Secondary | ICD-10-CM

## 2019-11-01 MED ORDER — IPRATROPIUM-ALBUTEROL 0.5-2.5 (3) MG/3ML IN SOLN: 3.0000 mL | Freq: Four times a day (QID) | RESPIRATORY_TRACT | 11 refills | Status: DC | PRN

## 2019-11-01 NOTE — Patient Instructions (Addendum)
COPD  We will get your nebulizer to be used up to 4 times a day as needed  Albuterol should be used as needed-use only 2 puffs  Talk to your cardiologist about medications that you are using to slow down the heart rate  If you can, get yourself a pulse ox-to check your oxygen regularly, you do not benefit from oxygen if it is not running below 88  Graded exercises as you can tolerate  Call your insurance company to find out what medications will be most affordable for the treatment of COPD-any preferred medications will be better than not using anything at all  Work on quitting smoking  I will see you back in 3 months

## 2019-11-01 NOTE — Telephone Encounter (Signed)
Refill request

## 2019-11-01 NOTE — Progress Notes (Signed)
[name]    RI:[id number]    [birth year]  Primary Care Physician:Gutierrez, Garlon Hatchet, MD  Referring Physician: Ria Bush, MD [address] [place],  [place] [address]  Chief complaint:   Patient with shortness of breath  HPI:  Shortness of breath History of obstructive lung disease History of heart failure History of atrial fibrillation for which he is on multiple medications-it does appear that he may be taking some of the medications as needed  [name] feels he needs oxygen supplementation-we had not able to document oxygen desaturations as he does not desaturate at rest, does not want to have an exercise desaturation test  Not willing to quit smoking at present  Had a sleep study in the past showing moderate obstructive sleep apnea  Recently tried to repeat a sleep study but got very nauseous during the study and study was discontinued Does not want to repeat a sleep study He does not feel he has any symptoms to suggest he has sleep apnea  Outpatient Encounter Medications as of 11/01/2019  Medication Sig  . albuterol (VENTOLIN HFA) 108 (90 Base) MCG/ACT inhaler INHALE 2 PUFFS BY MOUTH EVERY 6 HOURS AS NEEDED FOR WHEEZING OR SHORTNESS OF BREATH  . amiodarone (PACERONE) 200 MG tablet TAKE 1 TABLET(200 MG) BY MOUTH TWICE DAILY  . amLODipine (NORVASC) 5 MG tablet TAKE 1 TABLET(5 MG) BY MOUTH DAILY AS NEEDED  . Cholecalciferol (VITAMIN D3) 25 MCG (1000 UT) CAPS Take 1 capsule (1,000 Units total) by mouth daily.  . Coenzyme Q10 (COQ-10 PO) Take 1 tablet by mouth daily.  Marland Kitchen diltiazem (CARDIZEM) 30 MG tablet TAKE 1 TABLET BY MOUTH EVERY DAY AS DIRECTED AS NEEDED FOR FAST HEART RATE  . diphenoxylate-atropine (LOMOTIL) 2.5-0.025 MG tablet TAKE 1 TABLET BY MOUTH TWICE DAILY AS NEEDED  . fish oil-omega-3 fatty acids 1000 MG capsule Take 2 g by mouth daily.   . furosemide (LASIX) 20 MG tablet TAKE 1 TABLET BY MOUTH EVERY DAY AS NEEDED FOR FLUID RETENTION  .  gabapentin (NEURONTIN) 600 MG tablet TAKE 1 TABLET(600 MG) BY MOUTH FOUR TIMES DAILY AS NEEDED  . HYDROcodone-acetaminophen (NORCO) 10-325 MG tablet Take 1 tablet by mouth every 6 (six) hours as needed for moderate pain or severe pain.  . metFORMIN (GLUCOPHAGE-XR) 500 MG 24 hr tablet Take 2 tablets (1,000 mg total) by mouth 2 (two) times daily.  . methocarbamol (ROBAXIN) 500 MG tablet Take 1 tablet (500 mg total) by mouth 2 (two) times daily.  . metoprolol tartrate (LOPRESSOR) 25 MG tablet TAKE 1 TABLET(25 MG) BY MOUTH EVERY DAY AT 5 PM AS DIRECTED  . MYRBETRIQ 25 MG TB24 tablet TAKE 1 TABLET(25 MG) BY MOUTH DAILY  . nitroGLYCERIN (NITROLINGUAL) 0.4 MG/SPRAY spray USE 1 SPRAY AS DIRECTED EVERY 5 MINUTES AS NEEDED  . omeprazole (PRILOSEC) 40 MG capsule TAKE 1 CAPSULE BY MOUTH EVERY DAY  . pravastatin (PRAVACHOL) 20 MG tablet Take 1 tablet (20 mg total) by mouth daily.  . tamsulosin (FLOMAX) 0.4 MG CAPS capsule Take 1 capsule (0.4 mg total) by mouth 2 (two) times daily.  Marland Kitchen tiotropium (SPIRIVA HANDIHALER) 18 MCG inhalation capsule Place 1 capsule (18 mcg total) into inhaler and inhale daily.  . vitamin B-12 (CYANOCOBALAMIN) 1000 MCG tablet Take 1 tablet (1,000 mcg total) by mouth daily.  Marland Kitchen warfarin (COUMADIN) 5 MG tablet TAKE AS DIRECTED BY ANTICOAGULANT CLINIC  . [DISCONTINUED] ferrous sulfate 325 (65 FE) MG tablet Take 1 tablet (  325 mg total) by mouth every Monday, Wednesday, and Friday.  . [DISCONTINUED] guaifenesin (HUMIBID E) 400 MG TABS tablet Take 1 tablet (400 mg total) by mouth every 6 (six) hours as needed (mucous congestion).  . [DISCONTINUED] HYDROcodone-acetaminophen (NORCO) 10-325 MG tablet Take 2 tablets by mouth 3 (three) times daily as needed for up to 10 days for severe pain.  . [DISCONTINUED] ondansetron (ZOFRAN) 4 MG tablet Take 1 tablet (4 mg total) by mouth every 8 (eight) hours as needed for nausea or vomiting.  . [DISCONTINUED] varenicline (CHANTIX CONTINUING MONTH PAK) 1 MG  tablet Take 1 tablet (1 mg total) by mouth 2 (two) times daily.  . [DISCONTINUED] varenicline (CHANTIX STARTING MONTH PAK) 0.5 MG X 11 & 1 MG X 42 tablet Take one 0.5 mg tablet by mouth once daily for 3 days, then increase to one 0.5 mg tablet twice daily for 4 days, then increase to one 1 mg tablet twice daily.   No facility-administered encounter medications on file as of 11/01/2019.     Allergies as of 11/01/2019 - Review Complete 11/01/2019  Allergen Reaction Noted  . Oxycodone hcl Shortness Of Breath 02/03/2018  . Metformin and related Diarrhea 12/18/2017  . Varenicline tartrate Other (See Comments) 02/08/2011  . Varenicline tartrate Other (See Comments) 02/08/2011  . Wellbutrin [bupropion hcl] Other (See Comments) 04/17/2011  . Zocor [simvastatin] Other (See Comments) 04/17/2011    Past Medical History:  Diagnosis Date  . AAA (abdominal aortic aneurysm) ([place]) 2013   s/p stent graft repair now with supra/pararenal aneurysm 3.5cm, referred to Dr. Sammuel Hines at [facility] for endovascular repair (12/2013)  . Abnormal drug screen 06/2015   see problem list  . Cervical neck pain with evidence of disc disease 07/2011   MRI - disk bulging and foraminal stenosis, advanced at C4/5, 5/6; rec pain management for ESI by Dr. Mack Guise   . COPD (chronic obstructive pulmonary disease) ([facility])    mod-severe COPD/emphysema.  PFTs 12/2010.  He still smokes 1 ppd.  . Depression   . ED (erectile dysfunction) 02/2012   penile injections - failed viagra, poor arterial flow ([place])  . Fatty liver   . GERD (gastroesophageal reflux disease)   . Hyperlipidemia    myalgias with simvastatin and atorvastatin  . Leg cramps    idiopathic severe  . Muscle spasm    chronic  . Neuralgia    pain in hands. L>R from accident  . Obesity   . Olecranon bursitis of left elbow 09/01/2014   S/p aspiration x3 in our office and x2 by ortho [facility])   . OSA (obstructive sleep apnea) 03/2012   AHI 18.6, desat to 74%, severe  snoring, consider ENT eval  . Osteoarthritis   . Paroxysmal atrial fibrillation ([facility])    on coumadin only.  . Peripheral autonomic neuropathy due to diabetes mellitus ([place])   . 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 Right shoulder injury 05/2012   after fall out of chair, s/p injection, rec conservative management with PT Noemi [facility])  . Smoker    1ppd  . T2DM (type 2 diabetes mellitus) ([place])     Past Surgical History:  Procedure Laterality Date  . CHOLECYSTECTOMY  2001  . CTA abd  09/2011   6.1cm AAA, bilateral ing hernias, R with bladder wall, promient prostate calcifications  . ENDOVASCULAR STENT INSERTION  11/11/2011   Procedure: ENDOVASCULAR STENT  GRAFT INSERTION;  Surgeon: Angelia Mould, MD;  Location: [facility];  Service: Vascular;  Laterality: N/A;  aorta bi iliac  . KNEE SURGERY     L side cartilage taken out  . PFTs  12/2010   mod-severe obstruction, ?bronchodilator response  . TONSILLECTOMY      Family History  Problem Relation Age of Onset  . Heart disease Father   . Leukemia Father   . Coronary artery disease Father   . Melanoma Sister   . Arthritis Mother   . Diabetes Neg Hx   . Stroke Neg Hx     Social History   Socioeconomic History  . Marital status: Married    Spouse name: Juliann Pulse  . Number of children: 3  . Years of education: 12th gr  . Highest education level: Not on file  Occupational History  . Occupation: truck Education administrator: TRANSPORTATION    Comment: for years  . Occupation: Corporate treasurer: [employer]    Comment: runs this  Social Needs  . Financial resource strain: Not on file  . Food insecurity    Worry: Not on file    Inability: Not on file  . Transportation needs    Medical: Not on file    Non-medical: Not on file  Tobacco Use  . Smoking status: Current Every Day Smoker    Packs/day: 1.00    Years:  52.00    Pack years: 52.00    Types: Cigarettes  . Smokeless tobacco: Never Used  Substance and Sexual Activity  . Alcohol use: Yes    Alcohol/week: 0.0 standard drinks    Comment: beer occassionally  . Drug use: No  . Sexual activity: Not on file  Lifestyle  . Physical activity    Days per week: Not on file    Minutes per session: Not on file  . Stress: Not on file  Relationships  . Social Herbalist on phone: Not on file    Gets together: Not on file    Attends religious service: Not on file    Active member of club or organization: Not on file    Attends meetings of clubs or organizations: Not on file    Relationship status: Not on file  . Intimate partner violence    Fear of current or ex partner: Not on file    Emotionally abused: Not on file    Physically abused: Not on file    Forced sexual activity: Not on file  Other Topics Concern  . Not on file  Social History Narrative   Caffeine: 1 cup coffee, 1/2 gallon unsweet tea, 2 soda/day   Lives with wife and 1 dog, 6 cats outside.   Occ: Long distance truck driver   Started seeking care when received medicare.   H/o noncompliance    Review of Systems  Constitutional: Negative.   HENT: Negative.   Eyes: Negative.   Respiratory: Positive for wheezing.   Cardiovascular: Positive for leg swelling.  Gastrointestinal: Negative.   Endocrine: Negative.   Genitourinary: Negative.   Musculoskeletal: Positive for arthralgias.  Psychiatric/Behavioral: Positive for sleep disturbance.    Vitals:   11/01/19 1633  BP: 116/68  Pulse: 70  Temp: (!) 97.3 F (36.3 C)  SpO2: 95%     Physical Exam  Constitutional: He appears well-developed and well-nourished.  HENT:  Head: Normocephalic and atraumatic.  Eyes: Pupils are equal, round, and reactive to light. Conjunctivae are  normal. Right eye exhibits no discharge.  Neck: Normal range of motion. Neck supple. No tracheal deviation present. No thyromegaly  present.  Cardiovascular: Normal rate and regular rhythm.  Pulmonary/Chest: Effort normal. No respiratory distress. He has wheezes. He has no rales. He exhibits no tenderness.  Abdominal: Soft. Bowel sounds are normal. He exhibits no distension. There is no abdominal tenderness.  Musculoskeletal:        General: Edema present.  Neurological: He is alert.  Skin: Skin is warm and dry.   Saturations was checked with oximetry showing saturations of 94% on room air, heart rate was 38  Results of the Epworth flowsheet 11/01/2019 02/01/2018  Sitting and reading 1 0  Watching TV 2 0  Sitting, inactive in a public place (e.g. a theatre or a meeting) 0 0  As a passenger in a car for an hour without a break 2 1  Lying down to rest in the afternoon when circumstances permit 3 1  Sitting and talking to someone 0 0  Sitting quietly after a lunch without alcohol 1 0  In a car, while stopped for a few minutes in traffic 0 0  Total score 9 2   Assessment:  Multiple comorbidities  Shortness of breath with activity  Obstructive lung disease -Reluctant for any testing   Atrial fibrillation -On multiple medications -Unfortunately, he does appear to use the medications as needed, taking extra pills if he feels his heart rate is running too high  Chronic obstructive pulmonary disease -Currently uses albuterol -Was on Spiriva in the past-was not covered by his insurance and is not affordable for him  History of obstructive sleep apnea -Could not tolerate retesting recently  Deconditioning  Morbid obesity  Plan/Recommendations: Prescribed nebulizer with DuoNeb  Encouraged to talk with his cardiologist regarding his atrial fibrillation medications  Graded exercises as tolerated  Smoking cessation counseling  Encouraged to check his pulse ox regularly  I will see him back in the office in about 3 months  Sherrilyn Rist MD [facility] 11/01/2019, 5:20 PM  CC:  Ria Bush, MD

## 2019-11-02 ENCOUNTER — Ambulatory Visit (INDEPENDENT_AMBULATORY_CARE_PROVIDER_SITE_OTHER): Payer: Medicare Other

## 2019-11-02 DIAGNOSIS — Z5181 Encounter for therapeutic drug level monitoring: Secondary | ICD-10-CM | POA: Diagnosis not present

## 2019-11-02 DIAGNOSIS — I4891 Unspecified atrial fibrillation: Secondary | ICD-10-CM | POA: Diagnosis not present

## 2019-11-02 LAB — POCT INR: INR: 1.9 — AB (ref 2.0–3.0)

## 2019-11-02 NOTE — Progress Notes (Signed)
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  Pt reports that he becomes drowsy while driving so far away, but will call his wife to keep him awake.  Asked pt how often he stops for breaks - he reports that he doesn't. Asked if there were laws for taking breaks - he states that there are, but "they don't apply to me because I'm special".  Asked about the safety of the other drivers that are on the road while he is driving while sleepy, he replied "they don't matter to me, they're not special".  Asked how much longer he was planning on driving, he states "not much longer", but that he makes a lot of money doing so, so he will drive as long as he is able.

## 2019-11-02 NOTE — Patient Instructions (Signed)
Please take 1.5 tablets today, then resume dosage of 1/2 tablet every day EXCEPT 1 TABLET ON WEDNESDAYS.  Recheck INR in 3 weeks.

## 2019-11-06 ENCOUNTER — Other Ambulatory Visit: Payer: Self-pay | Admitting: Family Medicine

## 2019-11-06 DIAGNOSIS — G894 Chronic pain syndrome: Secondary | ICD-10-CM

## 2019-11-07 NOTE — Telephone Encounter (Signed)
Name of Medication: Hydrocodone-APAP Name of Pharmacy: [facility] Last Fill or Written Date and Quantity: 10/05/19, #120/0  Last Office Visit and Type: 07/25/19, acute f/x Next Office Visit and Type: none Last Controlled Substance Agreement Date: 07/06/17 Last UDS: 07/06/17

## 2019-11-08 MED ORDER — HYDROCODONE-ACETAMINOPHEN 10-325 MG PO TABS: 1.0000 | ORAL_TABLET | Freq: Four times a day (QID) | ORAL | 0 refills | Status: DC | PRN

## 2019-11-08 NOTE — Telephone Encounter (Signed)
ERx 

## 2019-11-14 ENCOUNTER — Other Ambulatory Visit: Payer: Self-pay

## 2019-11-14 NOTE — Telephone Encounter (Signed)
Received a refill request for furosemide 20mg  tablets. This was last refilled on 08/09/19 for #90 tablets with 0 refills. Patient was last seen on 07/25/19. Is this ok to refill?

## 2019-11-15 MED ORDER — FUROSEMIDE 20 MG PO TABS: ORAL_TABLET | ORAL | 0 refills | Status: DC

## 2019-11-17 DIAGNOSIS — E119 Type 2 diabetes mellitus without complications: Secondary | ICD-10-CM | POA: Diagnosis not present

## 2019-11-17 DIAGNOSIS — F17210 Nicotine dependence, cigarettes, uncomplicated: Secondary | ICD-10-CM | POA: Diagnosis not present

## 2019-11-17 DIAGNOSIS — K439 Ventral hernia without obstruction or gangrene: Secondary | ICD-10-CM | POA: Diagnosis not present

## 2019-11-17 DIAGNOSIS — Z006 Encounter for examination for normal comparison and control in clinical research program: Secondary | ICD-10-CM | POA: Diagnosis not present

## 2019-11-17 DIAGNOSIS — I716 Thoracoabdominal aortic aneurysm, without rupture: Secondary | ICD-10-CM | POA: Diagnosis not present

## 2019-11-17 DIAGNOSIS — E785 Hyperlipidemia, unspecified: Secondary | ICD-10-CM | POA: Diagnosis not present

## 2019-11-23 ENCOUNTER — Other Ambulatory Visit: Payer: Self-pay

## 2019-11-23 ENCOUNTER — Ambulatory Visit (INDEPENDENT_AMBULATORY_CARE_PROVIDER_SITE_OTHER): Payer: Medicare Other

## 2019-11-23 DIAGNOSIS — Z5181 Encounter for therapeutic drug level monitoring: Secondary | ICD-10-CM | POA: Diagnosis not present

## 2019-11-23 DIAGNOSIS — I4891 Unspecified atrial fibrillation: Secondary | ICD-10-CM | POA: Diagnosis not present

## 2019-11-23 LAB — POCT INR: INR: 1.9 — AB (ref 2.0–3.0)

## 2019-11-23 NOTE — Patient Instructions (Signed)
Please take 1.5 tablets today, then START NEW DOSAGE of 1/2 tablet every day EXCEPT 1 TABLET ON [place].  Recheck INR in 3 weeks.

## 2019-11-25 ENCOUNTER — Ambulatory Visit (INDEPENDENT_AMBULATORY_CARE_PROVIDER_SITE_OTHER)
Admission: RE | Admit: 2019-11-25 | Discharge: 2019-11-25 | Disposition: A | Discharge status: Home / Self Care | Payer: Medicare Other | Source: Ambulatory Visit | Attending: Family Medicine | Admitting: Family Medicine

## 2019-11-25 ENCOUNTER — Other Ambulatory Visit: Payer: Self-pay

## 2019-11-25 ENCOUNTER — Ambulatory Visit (INDEPENDENT_AMBULATORY_CARE_PROVIDER_SITE_OTHER): Payer: Medicare Other | Admitting: Family Medicine

## 2019-11-25 ENCOUNTER — Encounter: Payer: Self-pay | Admitting: Family Medicine

## 2019-11-25 ENCOUNTER — Telehealth: Payer: Self-pay | Admitting: Cardiovascular Disease

## 2019-11-25 VITALS — BP 110/60 | HR 79 | Temp 98.0°F | Ht 73.0 in | Wt 246.6 lb

## 2019-11-25 DIAGNOSIS — G8911 Acute pain due to trauma: Secondary | ICD-10-CM

## 2019-11-25 DIAGNOSIS — W19XXXA Unspecified fall, initial encounter: Secondary | ICD-10-CM

## 2019-11-25 DIAGNOSIS — M25511 Pain in right shoulder: Secondary | ICD-10-CM

## 2019-11-25 DIAGNOSIS — M25551 Pain in right hip: Secondary | ICD-10-CM

## 2019-11-25 DIAGNOSIS — G894 Chronic pain syndrome: Secondary | ICD-10-CM | POA: Diagnosis not present

## 2019-11-25 DIAGNOSIS — G8929 Other chronic pain: Secondary | ICD-10-CM

## 2019-11-25 DIAGNOSIS — J449 Chronic obstructive pulmonary disease, unspecified: Secondary | ICD-10-CM

## 2019-11-25 DIAGNOSIS — F1120 Opioid dependence, uncomplicated: Secondary | ICD-10-CM

## 2019-11-25 DIAGNOSIS — Z7901 Long term (current) use of anticoagulants: Secondary | ICD-10-CM

## 2019-11-25 DIAGNOSIS — E559 Vitamin D deficiency, unspecified: Secondary | ICD-10-CM

## 2019-11-25 MED ORDER — ALBUTEROL SULFATE HFA 108 (90 BASE) MCG/ACT IN AERS: INHALATION_SPRAY | RESPIRATORY_TRACT | 3 refills | Status: DC

## 2019-11-25 MED ORDER — METHOCARBAMOL 750 MG PO TABS: 750.0000 mg | ORAL_TABLET | Freq: Two times a day (BID) | ORAL | 1 refills | Status: DC

## 2019-11-25 NOTE — Patient Instructions (Addendum)
Xray today I think you had groin strain and hip bursitis, but not hip fracture or dislocation.  Continue muscle relaxant, hydrocodone and gabapentin.  Ice or heating pad to tender areas - whichever soothes better.  Should improve over the next few weeks.  Albuterol refilled. Ideally we would want to take spiriva daily to help control breathing/COPD

## 2019-11-25 NOTE — Telephone Encounter (Signed)
STAT if HR is under 50 or over 120 (normal HR is 60-100 beats per minute)  1) What is your heart rate? Yesterday patient fell and it was 52  2) Do you have a log of your heart rate readings (document readings)? When active it is 80 and 90 and starts feeling weak  3) Do you have any other symptoms? Weakness when elevated.  States when it goes above 50 he starts feeling weak.

## 2019-11-25 NOTE — Progress Notes (Signed)
This visit was conducted in person.  BP 110/60 (BP Location: Right Arm, Patient Position: Sitting, Cuff Size: Normal)   Pulse 79   Temp 98 F (36.7 C) (Temporal)   Ht 6\' 1"  (1.854 m)   Wt 246 lb 9 oz (111.8 kg)   SpO2 96%   BMI 32.53 kg/m    CC: fall with injury Subjective:    Patient ID: [name], male    DOB: November 01, 1946, 73 y.o.   MRN: [id number]  HPI: [name] is a 73 y.o. male presenting on 11/25/2019 for Fall (Pt fell at [facility] yesterday.  C/o right groin pain, right hip pain radiating down leg to knee and right shoulder pain.  Pt accompanied by wife, [name], 97.9]. ) and Medication Refill (Requests 4 inhalers. )   DOI: 11/24/2019 [place] at [facility] yesterday - girl ran into him while on her phone. Thinks he may have slipped into a split position.  Since then R groin staying very sore, R lateral hip pain into knee, R lateral shoulder and upper arm sore. Able to bear weight after accident. Able to move R shoulder/arm without significant pain but point tenderness to lateral R upper arm.   He did recently have comminuted distal R clavicular fracture.   Still has hydrocodone chronic pain meds, doesn't need refill yet.   COPD - requests albuterol refill. Using 1-2 times a day as unable to afford spiriva inhaler.      Relevant past medical, surgical, family and social history reviewed and updated as indicated. Interim medical history since our last visit reviewed. Allergies and medications reviewed and updated. Outpatient Medications Prior to Visit  Medication Sig Dispense Refill  . amiodarone (PACERONE) 200 MG tablet TAKE 1 TABLET(200 MG) BY MOUTH TWICE DAILY 180 tablet 3  . amLODipine (NORVASC) 5 MG tablet TAKE 1 TABLET(5 MG) BY MOUTH DAILY AS NEEDED 90 tablet 1  . Coenzyme Q10 (COQ-10 PO) Take 1 tablet by mouth daily.    Marland Kitchen diltiazem (CARDIZEM) 30 MG tablet TAKE 1 TABLET BY MOUTH EVERY DAY AS DIRECTED AS NEEDED FOR FAST HEART RATE 30 tablet 4  .  diphenoxylate-atropine (LOMOTIL) 2.5-0.025 MG tablet TAKE 1 TABLET BY MOUTH TWICE DAILY AS NEEDED 20 tablet 1  . fish oil-omega-3 fatty acids 1000 MG capsule Take 2 g by mouth daily.     . furosemide (LASIX) 20 MG tablet TAKE 1 TABLET BY MOUTH EVERY DAY AS NEEDED FOR FLUID RETENTION 90 tablet 0  . gabapentin (NEURONTIN) 600 MG tablet TAKE 1 TABLET(600 MG) BY MOUTH FOUR TIMES DAILY AS NEEDED 120 tablet 6  . HYDROcodone-acetaminophen (NORCO) 10-325 MG tablet Take 1 tablet by mouth every 6 (six) hours as needed for moderate pain or severe pain. 120 tablet 0  . ipratropium-albuterol (DUONEB) 0.5-2.5 (3) MG/3ML SOLN Take 3 mLs by nebulization every 6 (six) hours as needed. 360 mL 11  . metFORMIN (GLUCOPHAGE-XR) 500 MG 24 hr tablet Take 2 tablets (1,000 mg total) by mouth 2 (two) times daily. 360 tablet 3  . metoprolol tartrate (LOPRESSOR) 25 MG tablet TAKE 1 TABLET(25 MG) BY MOUTH EVERY DAY AT 5 PM AS DIRECTED 90 tablet 1  . nitroGLYCERIN (NITROLINGUAL) 0.4 MG/SPRAY spray USE 1 SPRAY AS DIRECTED EVERY 5 MINUTES AS NEEDED 4.9 g 1  . omeprazole (PRILOSEC) 40 MG capsule TAKE 1 CAPSULE BY MOUTH EVERY DAY 90 capsule 1  . pravastatin (PRAVACHOL) 20 MG tablet Take 1 tablet (20 mg total) by mouth daily. 90 tablet 3  .  tamsulosin (FLOMAX) 0.4 MG CAPS capsule Take 1 capsule (0.4 mg total) by mouth 2 (two) times daily. 60 capsule 11  . tiotropium (SPIRIVA HANDIHALER) 18 MCG inhalation capsule Place 1 capsule (18 mcg total) into inhaler and inhale daily. 30 capsule 6  . vitamin B-12 (CYANOCOBALAMIN) 1000 MCG tablet Take 1 tablet (1,000 mcg total) by mouth daily.    Marland Kitchen warfarin (COUMADIN) 5 MG tablet TAKE AS DIRECTED BY ANTICOAGULANT CLINIC 10 tablet 0  . albuterol (VENTOLIN HFA) 108 (90 Base) MCG/ACT inhaler INHALE 2 PUFFS BY MOUTH EVERY 6 HOURS AS NEEDED FOR WHEEZING OR SHORTNESS OF BREATH 8.5 g 3  . Cholecalciferol (VITAMIN D3) 25 MCG (1000 UT) CAPS Take 1 capsule (1,000 Units total) by mouth daily. 30 capsule   .  methocarbamol (ROBAXIN) 500 MG tablet Take 1 tablet (500 mg total) by mouth 2 (two) times daily. 180 tablet 1  . MYRBETRIQ 25 MG TB24 tablet TAKE 1 TABLET(25 MG) BY MOUTH DAILY 30 tablet 1   No facility-administered medications prior to visit.     Per HPI unless specifically indicated in ROS section below Review of Systems Objective:    BP 110/60 (BP Location: Right Arm, Patient Position: Sitting, Cuff Size: Normal)   Pulse 79   Temp 98 F (36.7 C) (Temporal)   Ht 6\' 1"  (1.854 m)   Wt 246 lb 9 oz (111.8 kg)   SpO2 96%   BMI 32.53 kg/m   Wt Readings from Last 3 Encounters:  11/25/19 246 lb 9 oz (111.8 kg)  11/01/19 244 lb 9.6 oz (110.9 kg)  09/27/19 238 lb 12.8 oz (108.3 kg)    Physical Exam Vitals and nursing note reviewed.  Constitutional:      Appearance: Normal appearance. He is obese. He is not ill-appearing.  Musculoskeletal:        General: Tenderness present. Normal range of motion.     Right lower leg: No edema.     Left lower leg: No edema.     Comments:  L shoulder WNL R shoulder - no pain at posterior shoulder. Point tender at [facility] joint and anterior and lateral shoulder to humerus. FROM at shoulder.  L hip WNL R hip - point tender to palpation lateral hip at bursa, no reproducible tenderness to palpation of femur or groin. No pain with int/ext rotation of femur in hip joint. Neg seated SLR test. Able to bear weight on legs.  Skin:    General: Skin is warm and dry.     Findings: No bruising or rash.  Neurological:     Mental Status: He is alert.       Results for orders placed or performed in visit on 11/23/19  POCT INR  Result Value Ref Range   INR 1.9 (A) 2.0 - 3.0   *Note: Due to a large number of results and/or encounters for the requested time period, some results have not been displayed. A complete set of results can be found in Results Review.   DG Shoulder Right CLINICAL DATA:  Right shoulder pain after fall yesterday.  EXAM: RIGHT SHOULDER -  2+ VIEW  COMPARISON:  07/25/2019  FINDINGS: Exam demonstrates nonunion of patient's known distal right clavicle fracture mild degenerate changes of the [facility] joint and glenohumeral joints. No acute fractures or dislocation.  IMPRESSION: No acute findings.  Known distal right clavicle fracture with nonunion.  Electronically Signed   By: Marin Olp M.D.   On: 11/25/2019 16:21 DG Hip Unilat W OR W/O  Pelvis 2-3 Views Right CLINICAL DATA:  Right lateral hip and groin pain after fall.  EXAM: DG HIP (WITH OR WITHOUT PELVIS) 2-3V RIGHT  COMPARISON:  CT report 01/22/2018.  FINDINGS: Iliac stents noted. Surgical clips over the pubis. Diffuse osteopenia degenerative change. No acute bony abnormality identified. No evidence of fracture dislocation.  IMPRESSION: Diffuse osteopenia and degenerative change. No acute bony or joint abnormality identified. No evidence of fracture dislocation.  Electronically Signed   By: Marcello Moores  Register   On: 11/25/2019 16:15   Assessment & Plan:  This visit occurred during the SARS-CoV-2 public health emergency.  Safety protocols were in place, including screening questions prior to the visit, additional usage of staff PPE, and extensive cleaning of exam room while observing appropriate contact time as indicated for disinfecting solutions.   Problem List Items Addressed This Visit    Vitamin D insufficiency    Levels low, unsure if he's taking daily replacement - Rx 50k units weekly.       Opiate dependence ([place]) (Chronic)   Fall with injury - Primary    Fall at [facility].  Doubt fracture. Check films today.      Relevant Orders   DG Hip Unilat W OR W/O Pelvis 2-3 Views Right (Completed)   DG Shoulder Right (Completed)   Encounter for chronic pain management    [place] CSRS reviewed. Needs UDS updated. Will need to review #/month after temporary increase after clavicle fracture suffered 07/2019 (see above).       Current use of long term  anticoagulation (Coumadin) (Chronic)   COPD mixed type (HCC)    Albuterol refilled. He is unable to afford spiriva. He continue smoking.       Relevant Medications   albuterol (VENTOLIN HFA) 108 (90 Base) MCG/ACT inhaler   Chronic pain syndrome (Chronic)    [place] CSRS reviewed.  Previously on #90 hydrocodone 10/325mg /month, this was increased to #120 after fall with clavicle fracture 07/2019. Will need to review #/month at f/u chronic pain visit.  He will call back when refill due (12/2019)      Relevant Medications   methocarbamol (ROBAXIN) 750 MG tablet   Acute right hip pain    After fall - not consistent with hip fracture. Check films r/o acute osseous injury. Anticipate R groin strain and hip bursitis after fall. Supportive care reviewed, continue current regimen of narcotic, robaxin (increased to 750mg  dose). Discussed ice/heat to tender area. Pt agrees with plan.       Acute pain of right shoulder due to trauma    Pain after fall at [facility] - overall reassuring exam. Check films r/o fracture (in h/o recent clavicle fracture). Supportive care reviewed.           Meds ordered this encounter  Medications  . DISCONTD: albuterol (VENTOLIN HFA) 108 (90 Base) MCG/ACT inhaler    Sig: INHALE 2 PUFFS BY MOUTH EVERY 6 HOURS AS NEEDED FOR WHEEZING OR SHORTNESS OF BREATH    Dispense:  8.5 g    Refill:  3  . albuterol (VENTOLIN HFA) 108 (90 Base) MCG/ACT inhaler    Sig: INHALE 2 PUFFS BY MOUTH EVERY 6 HOURS AS NEEDED FOR WHEEZING OR SHORTNESS OF BREATH    Dispense:  18 g    Refill:  3  . methocarbamol (ROBAXIN) 750 MG tablet    Sig: Take 1 tablet (750 mg total) by mouth 2 (two) times daily.    Dispense:  180 tablet    Refill:  1  .  Cholecalciferol (VITAMIN D3) 1.25 MG (50000 UT) TABS    Sig: Take 1 tablet by mouth once a week.    Dispense:  12 tablet    Refill:  1   Orders Placed This Encounter  Procedures  . DG Hip Unilat W OR W/O Pelvis 2-3 Views Right    Standing Status:    Future    Number of Occurrences:   1    Standing Expiration Date:   01/25/2021    Order Specific Question:   Reason for Exam (SYMPTOM  OR DIAGNOSIS REQUIRED)    Answer:   R lateral hip and groin pain after fall    Order Specific Question:   Preferred imaging location?    Answer:   [facility]    Order Specific Question:   Radiology Contrast Protocol - do NOT remove file path    Answer:   \\[email or web address]  . DG Shoulder Right    Standing Status:   Future    Number of Occurrences:   1    Standing Expiration Date:   01/25/2021    Order Specific Question:   Reason for Exam (SYMPTOM  OR DIAGNOSIS REQUIRED)    Answer:   R shoulder pain after fall yesterday    Order Specific Question:   Preferred imaging location?    Answer:   [facility]    Order Specific Question:   Radiology Contrast Protocol - do NOT remove file path    Answer:   \\[email or web address].[email or web address]    Patient Instructions  Xray today I think you had groin strain and hip bursitis, but not hip fracture or dislocation.  Continue muscle relaxant, hydrocodone and gabapentin.  Ice or heating pad to tender areas - whichever soothes better.  Should improve over the next few weeks.  Albuterol refilled. Ideally we would want to take spiriva daily to help control breathing/COPD   Follow up plan: Return if symptoms worsen or fail to improve.  Ria Bush, MD

## 2019-11-25 NOTE — Telephone Encounter (Signed)
Spoke with patients wife and she reports his heart rates have been up and down. She states he functions well with heart rate of 47-52. When his heart rates increases he does not feel well and is weak. I reviewed his chart and based on last visits his amiodarone was discontinued with instructions for him to continue metoprolol and diltiazem. Patient was in back ground cussing and stating that amiodarone has been the only thing that has helped him. He said that he would find another F__ing doctor and to cancel his appointment. His caregiver [name] was apologetic, requested that we keep appointment, and she was very sorry for his behavior. Requested that she please call back if appointment needs to be cancelled. She verbalized understanding of our conversation, agreement with plan, and had no further questions at this time.

## 2019-11-25 NOTE — Telephone Encounter (Signed)
Can you review for refill on amiodarone. I see the patient was to hold from taking it and haven't seen where he was told to start it back.

## 2019-11-25 NOTE — Telephone Encounter (Signed)
*  STAT* If patient is at the pharmacy, call can be transferred to refill team.   1. Which medications need to be refilled? (please list name of each medication and dose if known) Amiodarone  2. Which pharmacy/location (including street and city if local pharmacy) is medication to be sent to? [address]  3. Do they need a 30 day or 90 day supply? [place]

## 2019-11-26 ENCOUNTER — Encounter: Payer: Self-pay | Admitting: Family Medicine

## 2019-11-26 DIAGNOSIS — M25551 Pain in right hip: Secondary | ICD-10-CM | POA: Insufficient documentation

## 2019-11-26 DIAGNOSIS — M25511 Pain in right shoulder: Secondary | ICD-10-CM | POA: Insufficient documentation

## 2019-11-26 DIAGNOSIS — G8911 Acute pain due to trauma: Secondary | ICD-10-CM | POA: Insufficient documentation

## 2019-11-26 MED ORDER — VITAMIN D3 1.25 MG (50000 UT) PO TABS: 1.0000 | ORAL_TABLET | ORAL | 1 refills | Status: DC

## 2019-11-26 NOTE — Assessment & Plan Note (Addendum)
[place] CSRS reviewed.  Previously on #90 hydrocodone 10/325mg /month, this was increased to #120 after fall with clavicle fracture 07/2019. Will need to review #/month at f/u chronic pain visit.  He will call back when refill due (12/2019)

## 2019-11-26 NOTE — Assessment & Plan Note (Signed)
After fall - not consistent with hip fracture. Check films r/o acute osseous injury. Anticipate R groin strain and hip bursitis after fall. Supportive care reviewed, continue current regimen of narcotic, robaxin (increased to 750mg  dose). Discussed ice/heat to tender area. Pt agrees with plan.

## 2019-11-26 NOTE — Assessment & Plan Note (Addendum)
 CSRS reviewed. Needs UDS updated. Will need to review #/month after temporary increase after clavicle fracture suffered 07/2019 (see above).

## 2019-11-26 NOTE — Assessment & Plan Note (Addendum)
Fall at [place].  Doubt fracture. Check films today.

## 2019-11-26 NOTE — Assessment & Plan Note (Signed)
Levels low, unsure if he's taking daily replacement - Rx 50k units weekly.

## 2019-11-26 NOTE — Assessment & Plan Note (Signed)
Albuterol refilled. He is unable to afford spiriva. He continue smoking.

## 2019-11-26 NOTE — Assessment & Plan Note (Signed)
Pain after fall at [facility] - overall reassuring exam. Check films r/o fracture (in h/o recent clavicle fracture). Supportive care reviewed.

## 2019-11-28 ENCOUNTER — Other Ambulatory Visit: Payer: Self-pay | Admitting: *Deleted

## 2019-11-28 MED ORDER — AMIODARONE HCL 200 MG PO TABS: ORAL_TABLET | ORAL | 0 refills | Status: DC

## 2019-11-28 NOTE — Telephone Encounter (Signed)
Me to Minna Merritts, MD    11/25/19 5:23 PM Pt was cussing and very angry because he states amiodarone is the only thing that helps him. Last 2 visits it was noted for him to stop medication and stay on metoprolol and diltiazem. Appt scheduled and Arbie Cookey was apologetic for his behavior.

## 2019-11-28 NOTE — Telephone Encounter (Signed)
Please advise if ok to refill Amiodarone 200 mg tablet bid. Amiodarone held @ last OV 06/14/2019 Medication pending phone note concerning Amiodarone.  *Error- I sent Amiodarone Rx to pharmacy trying to route message to Triage . I contacted pharmacy to cancel Rx and forwarded note to Triage.

## 2019-12-05 ENCOUNTER — Other Ambulatory Visit: Payer: Self-pay | Admitting: Family Medicine

## 2019-12-05 DIAGNOSIS — G894 Chronic pain syndrome: Secondary | ICD-10-CM

## 2019-12-05 MED ORDER — HYDROCODONE-ACETAMINOPHEN 10-325 MG PO TABS: 1.0000 | ORAL_TABLET | Freq: Four times a day (QID) | ORAL | 0 refills | Status: DC | PRN

## 2019-12-05 NOTE — Progress Notes (Signed)
Wife requests new Rx to pharmacy of pain medication.

## 2019-12-12 ENCOUNTER — Ambulatory Visit: Payer: Medicare Other | Admitting: Cardiovascular Disease

## 2019-12-14 ENCOUNTER — Other Ambulatory Visit: Payer: Self-pay

## 2019-12-14 ENCOUNTER — Ambulatory Visit (INDEPENDENT_AMBULATORY_CARE_PROVIDER_SITE_OTHER): Payer: Medicare Other

## 2019-12-14 DIAGNOSIS — I4891 Unspecified atrial fibrillation: Secondary | ICD-10-CM | POA: Diagnosis not present

## 2019-12-14 DIAGNOSIS — Z5181 Encounter for therapeutic drug level monitoring: Secondary | ICD-10-CM

## 2019-12-14 LAB — POCT INR: INR: 1.6 — AB (ref 2.0–3.0)

## 2019-12-14 NOTE — Patient Instructions (Signed)
Please take 1.5 tablets today, then START NEW DOSAGE of 1/2 tablet every day EXCEPT 1 TABLET ON MONDAYS, [place].  Recheck INR in 2 weeks.

## 2019-12-20 ENCOUNTER — Other Ambulatory Visit: Payer: Self-pay | Admitting: Radiation Oncology
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
## 2019-12-27 ENCOUNTER — Ambulatory Visit (INDEPENDENT_AMBULATORY_CARE_PROVIDER_SITE_OTHER): Payer: Medicare Other | Admitting: Cardiovascular Disease

## 2019-12-27 ENCOUNTER — Encounter: Payer: Self-pay | Admitting: Cardiovascular Disease

## 2019-12-27 ENCOUNTER — Other Ambulatory Visit: Payer: Self-pay

## 2019-12-27 VITALS — BP 120/70 | HR 44 | Ht 72.0 in | Wt 228.0 lb

## 2019-12-27 DIAGNOSIS — I2721 Secondary pulmonary arterial hypertension: Secondary | ICD-10-CM

## 2019-12-27 DIAGNOSIS — I4891 Unspecified atrial fibrillation: Secondary | ICD-10-CM | POA: Diagnosis not present

## 2019-12-27 DIAGNOSIS — I495 Sick sinus syndrome: Secondary | ICD-10-CM

## 2019-12-27 DIAGNOSIS — I77810 Thoracic aortic ectasia: Secondary | ICD-10-CM | POA: Diagnosis not present

## 2019-12-27 DIAGNOSIS — J432 Centrilobular emphysema: Secondary | ICD-10-CM

## 2019-12-27 DIAGNOSIS — I10 Essential (primary) hypertension: Secondary | ICD-10-CM

## 2019-12-27 DIAGNOSIS — E782 Mixed hyperlipidemia: Secondary | ICD-10-CM | POA: Diagnosis not present

## 2019-12-27 DIAGNOSIS — R001 Bradycardia, unspecified: Secondary | ICD-10-CM | POA: Diagnosis not present

## 2019-12-27 MED ORDER — FUROSEMIDE 20 MG PO TABS: ORAL_TABLET | ORAL | 1 refills | Status: DC

## 2019-12-27 MED ORDER — AMIODARONE HCL 200 MG PO TABS: ORAL_TABLET | ORAL | 1 refills | Status: DC

## 2019-12-27 NOTE — Patient Instructions (Addendum)
Medication Instructions:  Please take lasix daily for leg swelling  Take amiodarone 200 mg daily  For atrial fibrillation, variable heart rate Take amiodarone 1-2 pills as needed  If atrial fib and blood pressure elevated, You can do a metoprolol and diltiazem  If pressure 90 top number, Hold off on diltiazem and metoprolol  If you need a refill on your cardiac medications before your next appointment, please call your pharmacy.    Lab work: No new labs needed   If you have labs (blood work) drawn today and your tests are completely normal, you will receive your results only by: Marland Kitchen MyChart Message (if you have MyChart) OR . A paper copy in the mail If you have any lab test that is abnormal or we need to change your treatment, we will call you to review the results.   Testing/Procedures: No new testing needed   Follow-Up: At [facility], you and your health needs are our priority.  As part of our continuing mission to provide you with exceptional heart care, we have created designated Provider Care Teams.  These Care Teams include your primary Cardiologist (physician) and Advanced Practice Providers (APPs -  Physician Assistants and Nurse Practitioners) who all work together to provide you with the care you need, when you need it.  . You will need a follow up appointment in 6 months  . Providers on your designated Care Team:   . Murray Hodgkins, NP . Christell Faith, PA-C . Marrianne Mood, PA-C  Any Other Special Instructions Will Be Listed Below (If Applicable).  For educational health videos Log in to : [email or web address] Or : [email or web address], password : [email or web address]

## 2019-12-30 DIAGNOSIS — H25813 Combined forms of age-related cataract, bilateral: Secondary | ICD-10-CM | POA: Diagnosis not present

## 2019-12-30 LAB — HM DIABETES EYE EXAM

## 2020-01-02 ENCOUNTER — Other Ambulatory Visit: Payer: Self-pay | Admitting: Family Medicine

## 2020-01-02 DIAGNOSIS — G894 Chronic pain syndrome: Secondary | ICD-10-CM

## 2020-01-03 NOTE — Telephone Encounter (Signed)
Name of Medication: Hydrocodone-APAP Name of Pharmacy: [facility] Last Fill or Written Date and Quantity: 12/05/19, #120 Last Office Visit and Type: 12/05/19, acute fall Next Office Visit and Type: none Last Controlled Substance Agreement Date: 07/06/17 Last UDS: 07/06/17

## 2020-01-04 ENCOUNTER — Other Ambulatory Visit: Payer: Self-pay | Admitting: Family Medicine

## 2020-01-04 DIAGNOSIS — G894 Chronic pain syndrome: Secondary | ICD-10-CM

## 2020-01-06 ENCOUNTER — Other Ambulatory Visit: Payer: Self-pay | Admitting: Cardiovascular Disease

## 2020-01-06 ENCOUNTER — Other Ambulatory Visit: Payer: Self-pay | Admitting: Family Medicine

## 2020-01-06 ENCOUNTER — Other Ambulatory Visit: Payer: Self-pay

## 2020-01-06 MED ORDER — HYDROCODONE-ACETAMINOPHEN 10-325 MG PO TABS: 1.0000 | ORAL_TABLET | Freq: Four times a day (QID) | ORAL | 0 refills | Status: DC | PRN

## 2020-01-06 MED ORDER — WARFARIN SODIUM 5 MG PO TABS: ORAL_TABLET | ORAL | 0 refills | Status: DC

## 2020-01-06 NOTE — Telephone Encounter (Signed)
*  STAT* If patient is at the pharmacy, call can be transferred to refill team.   1. Which medications need to be refilled? (please list name of each medication and dose if known) warfarin  2. Which pharmacy/location (including street and city if local pharmacy) is medication to be sent to? [facility]  3. Do they need a 30 day or 90 day supply? #100 tablets requested

## 2020-01-06 NOTE — Addendum Note (Signed)
Addended by: Johny Shock B on: 01/06/2020 11:59 AM   Modules accepted: Orders

## 2020-01-06 NOTE — Telephone Encounter (Signed)
ERx. plz notify wife

## 2020-01-06 NOTE — Telephone Encounter (Signed)
Refill request

## 2020-01-06 NOTE — Telephone Encounter (Signed)
Pt's wife calling checking on refill he is out of medication.

## 2020-01-06 NOTE — Telephone Encounter (Signed)
Pt overdue for an appointment to get his INR checked. Called and spoke to pt . Scheduled him to come in on 01/09/2020 at the [place] office. Pt stated he only has 2 tablets left of coumadin. Will refill prescription and give him a 1 month supply.

## 2020-01-09 ENCOUNTER — Ambulatory Visit (INDEPENDENT_AMBULATORY_CARE_PROVIDER_SITE_OTHER): Payer: Medicare Other

## 2020-01-09 ENCOUNTER — Other Ambulatory Visit: Payer: Self-pay

## 2020-01-09 DIAGNOSIS — Z5181 Encounter for therapeutic drug level monitoring: Secondary | ICD-10-CM | POA: Diagnosis not present

## 2020-01-09 DIAGNOSIS — I4891 Unspecified atrial fibrillation: Secondary | ICD-10-CM | POA: Diagnosis not present

## 2020-01-09 LAB — POCT INR: INR: 5.2 — AB (ref 2.0–3.0)

## 2020-01-09 NOTE — Patient Instructions (Signed)
-   skip warfarin tonight,  - then START NEW DOSAGE of 1/2 tablet every day EXCEPT 1 TABLET ON MONDAYS & FRIDAYS.  Recheck INR in 2 weeks.

## 2020-01-09 NOTE — Progress Notes (Signed)
Pt was very belligerent at today's visit.  He arrived to his appt 15 mins late and yelled that I made him wait once he got here (he only waited 3 mins), he yelled that his INR readings are incorrect because I am not doing my job correctly - advised pt that he can go to our [place] office if he has a problem w/ me, but he states that is too much trouble.  Inquired if pt had been offered another anticoagulant since he is frustrated about his fluctuating INR levels.  He states that he was offered Eliquis before, but he "likes to know how thin his blood is".  Explained to pt and his wife why we monitor INR and that is the only drug that this monitors.  After some explanation, pt states that he will consider Eliquis if it is not too expensive.  Advised him to speak w/ Dr. Rockey Situ and his pharmacist.  He is quiet at the end of the visit, his wife is appreciative.

## 2020-01-16 ENCOUNTER — Telehealth: Payer: Self-pay | Admitting: *Deleted

## 2020-01-16 NOTE — Telephone Encounter (Signed)
-----   Message from Minna Merritts, MD sent at 01/14/2020 10:51 AM EST ----- He has displayed poor behavior in the past Pam, to limit his trips to the office, Can we send in eliquis 5 BID, We would help him transition off the warfrain Thx TG  ----- Message ----- From: Stana Bunting, RN Sent: 01/09/2020   4:25 PM EST To: Minna Merritts, MD

## 2020-01-16 NOTE — Telephone Encounter (Signed)
Left voicemail message to call back about some possible medication changes.

## 2020-01-17 NOTE — Telephone Encounter (Signed)
Patient spouse returning call  Please call Juliann Pulse at [phone]

## 2020-01-17 NOTE — Telephone Encounter (Signed)
Spoke with patients wife per release form and she wanted to review this information on medication so she can do some research. Provided with name, dose, frequency, and how many pills in order to run it past insurance. She said that they would look this info up and then get back in touch with [place] on if they want to switch.

## 2020-01-17 NOTE — Telephone Encounter (Signed)
Spoke with patient and he wants to wait and review side effects, cost, and make sure his coumadin levels are right before switching to anything. Advised that he speak with his pharmacist to review all of these questions he may have and to call us back or let the coumadin nurse know at his next visit. He verbalized understanding with no further questions at this time.

## 2020-01-20 ENCOUNTER — Encounter: Payer: Self-pay | Admitting: Family Medicine

## 2020-01-20 ENCOUNTER — Ambulatory Visit (INDEPENDENT_AMBULATORY_CARE_PROVIDER_SITE_OTHER): Payer: Medicare Other | Admitting: Family Medicine

## 2020-01-20 ENCOUNTER — Other Ambulatory Visit: Payer: Self-pay

## 2020-01-20 VITALS — BP 130/70 | HR 48 | Temp 97.6°F | Ht 73.0 in | Wt 241.4 lb

## 2020-01-20 DIAGNOSIS — E1165 Type 2 diabetes mellitus with hyperglycemia: Secondary | ICD-10-CM

## 2020-01-20 DIAGNOSIS — I480 Paroxysmal atrial fibrillation: Secondary | ICD-10-CM

## 2020-01-20 DIAGNOSIS — E1140 Type 2 diabetes mellitus with diabetic neuropathy, unspecified: Secondary | ICD-10-CM

## 2020-01-20 DIAGNOSIS — J449 Chronic obstructive pulmonary disease, unspecified: Secondary | ICD-10-CM | POA: Diagnosis not present

## 2020-01-20 DIAGNOSIS — Z7189 Other specified counseling: Secondary | ICD-10-CM | POA: Diagnosis not present

## 2020-01-20 DIAGNOSIS — IMO0002 Reserved for concepts with insufficient information to code with codable children: Secondary | ICD-10-CM

## 2020-01-20 LAB — BASIC METABOLIC PANEL
BUN: 19 mg/dL (ref 6–23)
CO2: 33 mEq/L — ABNORMAL HIGH (ref 19–32)
Calcium: 8.5 mg/dL (ref 8.4–10.5)
Chloride: 100 mEq/L (ref 96–112)
Creatinine, Ser: 1.12 mg/dL (ref 0.40–1.50)
GFR: 63.79 mL/min (ref >60.00)
Glucose, Bld: 256 mg/dL — ABNORMAL HIGH (ref 70–99)
Potassium: 4 mEq/L (ref 3.5–5.1)
Sodium: 139 mEq/L (ref 135–145)

## 2020-01-20 LAB — HEMOGLOBIN A1C: Hgb A1c MFr Bld: 8.8 % — ABNORMAL HIGH (ref 4.6–6.5)

## 2020-01-20 MED ORDER — PREDNISONE 20 MG PO TABS: ORAL_TABLET | ORAL | 0 refills | Status: DC

## 2020-01-20 MED ORDER — ALBUTEROL SULFATE HFA 108 (90 BASE) MCG/ACT IN AERS: INHALATION_SPRAY | RESPIRATORY_TRACT | 3 refills | Status: DC

## 2020-01-20 MED ORDER — SPIRIVA HANDIHALER 18 MCG IN CAPS: 18.0000 ug | ORAL_CAPSULE | Freq: Every day | RESPIRATORY_TRACT | 11 refills | Status: DC

## 2020-01-20 NOTE — Progress Notes (Signed)
This visit was conducted in person.  BP 130/70   Pulse (!) 48   Temp 97.6 F (36.4 C) (Skin)   Ht 6\' 1"  (1.854 m)   Wt 241 lb 7 oz (109.5 kg)   BMI 31.85 kg/m    CC: med concerns Subjective:    Patient ID: [name], male    DOB: [birth year], 74 y.o.   MRN: [id number]  HPI: [name] is a 74 y.o. male presenting on 01/20/2020 for discuss medications   "I need more proair than 3 inhalers a month." uses albuterol inhaler 2-3 times a day, but 5! puffs at a time. He is regularly using with spacer. He has not been using duoneb "that doesn't work at all". He ran out of spiriva months ago. No recent prednisone use. He feels cold weather has worsened his breathing. He denies worsening productive cough but has ongoing wheezing and dyspnea. Continued smoker.   Recent planned switch from warfarin to eliquis by cardiology due to fluctuating INR levels.   He has been taking lasix 20mg  daily for the last few weeks.   DM - he takes his metformin XR PRN. Written as 1000mg  bid. Notes fluctuating sugars.      Relevant past medical, surgical, family and social history reviewed and updated as indicated. Interim medical history since our last visit reviewed. Allergies and medications reviewed and updated. Outpatient Medications Prior to Visit  Medication Sig Dispense Refill  . amiodarone (PACERONE) 200 MG tablet TAKE 1 TABLET(200 MG) BY MOUTH TWICE DAILY 90 tablet 1  . Cholecalciferol (VITAMIN D3) 1.25 MG (50000 UT) TABS Take 1 tablet by mouth once a week. 12 tablet 1  . Coenzyme Q10 (COQ-10 PO) Take 1 tablet by mouth daily.    Marland Kitchen diltiazem (CARDIZEM) 30 MG tablet TAKE 1 TABLET BY MOUTH EVERY DAY AS DIRECTED AS NEEDED FOR FAST HEART RATE 30 tablet 4  . diphenoxylate-atropine (LOMOTIL) 2.5-0.025 MG tablet TAKE 1 TABLET BY MOUTH TWICE DAILY AS NEEDED 20 tablet 1  . fish oil-omega-3 fatty acids 1000 MG capsule Take 2 g by mouth daily.     . furosemide (LASIX) 20 MG tablet TAKE 1 TABLET BY MOUTH  EVERY DAY AS NEEDED FOR FLUID RETENTION 90 tablet 1  . gabapentin (NEURONTIN) 600 MG tablet TAKE 1 TABLET(600 MG) BY MOUTH FOUR TIMES DAILY AS NEEDED 120 tablet 6  . HYDROcodone-acetaminophen (NORCO) 10-325 MG tablet Take 1 tablet by mouth every 6 (six) hours as needed for moderate pain or severe pain. 120 tablet 0  . ipratropium-albuterol (DUONEB) 0.5-2.5 (3) MG/3ML SOLN Take 3 mLs by nebulization every 6 (six) hours as needed. 360 mL 11  . metFORMIN (GLUCOPHAGE-XR) 500 MG 24 hr tablet Take 2 tablets (1,000 mg total) by mouth 2 (two) times daily. 360 tablet 3  . methocarbamol (ROBAXIN) 750 MG tablet Take 1 tablet (750 mg total) by mouth 2 (two) times daily. 180 tablet 1  . metoprolol tartrate (LOPRESSOR) 25 MG tablet TAKE 1 TABLET(25 MG) BY MOUTH EVERY DAY AT 5 PM AS DIRECTED 90 tablet 1  . nitroGLYCERIN (NITROLINGUAL) 0.4 MG/SPRAY spray USE 1 SPRAY AS DIRECTED EVERY 5 MINUTES AS NEEDED 4.9 g 1  . omeprazole (PRILOSEC) 40 MG capsule TAKE 1 CAPSULE BY MOUTH EVERY DAY 90 capsule 1  . pravastatin (PRAVACHOL) 20 MG tablet Take 1 tablet (20 mg total) by mouth daily. 90 tablet 3  . tamsulosin (FLOMAX) 0.4 MG CAPS capsule TAKE 1 CAPSULE(0.4 MG) BY MOUTH TWICE DAILY 60  capsule 11  . vitamin B-12 (CYANOCOBALAMIN) 1000 MCG tablet Take 1 tablet (1,000 mcg total) by mouth daily.    Marland Kitchen warfarin (COUMADIN) 5 MG tablet TAKE AS DIRECTED BY ANTICOAGULANT CLINIC 70 tablet 0  . albuterol (VENTOLIN HFA) 108 (90 Base) MCG/ACT inhaler INHALE 2 PUFFS BY MOUTH EVERY 6 HOURS AS NEEDED FOR WHEEZING OR SHORTNESS OF BREATH 18 g 3  . tiotropium (SPIRIVA HANDIHALER) 18 MCG inhalation capsule Place 1 capsule (18 mcg total) into inhaler and inhale daily. 30 capsule 6   No facility-administered medications prior to visit.     Per HPI unless specifically indicated in ROS section below Review of Systems Objective:    BP 130/70   Pulse (!) 48   Temp 97.6 F (36.4 C) (Skin)   Ht 6\' 1"  (1.854 m)   Wt 241 lb 7 oz (109.5 kg)    BMI 31.85 kg/m   Wt Readings from Last 3 Encounters:  01/20/20 241 lb 7 oz (109.5 kg)  12/27/19 228 lb (103.4 kg)  11/25/19 246 lb 9 oz (111.8 kg)    Physical Exam Vitals and nursing note reviewed.  Constitutional:      General: He is not in acute distress.    Appearance: Normal appearance. He is well-developed. He is obese. He is not ill-appearing.  HENT:     Head: Normocephalic and atraumatic.     Right Ear: External ear normal.     Left Ear: External ear normal.  Cardiovascular:     Rate and Rhythm: Normal rate and regular rhythm.     Pulses: Normal pulses.     Heart sounds: Normal heart sounds. No murmur.  Pulmonary:     Effort: Pulmonary effort is normal. No respiratory distress.     Breath sounds: Wheezing (mild) present. No rhonchi or rales.     Comments: Coarse breath sounds, mild wheezing throughout Musculoskeletal:     Cervical back: Normal range of motion and neck supple.     Right lower leg: Edema (tr) present.     Left lower leg: Edema (tr) present.     Comments: See HPI for foot exam if done  Lymphadenopathy:     Cervical: No cervical adenopathy.  Skin:    General: Skin is warm and dry.     Findings: No rash.  Neurological:     Mental Status: He is alert.        Lab Results  Component Value Date   CREATININE 1.12 01/20/2020   BUN 19 01/20/2020   NA 139 01/20/2020   K 4.0 01/20/2020   CL 100 01/20/2020   CO2 33 (H) 01/20/2020    Lab Results  Component Value Date   HGBA1C 8.8 (H) 01/20/2020   Lab Results  Component Value Date   INR 5.2 (A) 01/09/2020   INR 1.6 (A) 12/14/2019   INR 1.9 (A) 11/23/2019     Assessment & Plan:  This visit occurred during the SARS-CoV-2 public health emergency.  Safety protocols were in place, including screening questions prior to the visit, additional usage of staff PPE, and extensive cleaning of exam room while observing appropriate contact time as indicated for disinfecting solutions.   Problem List Items  Addressed This Visit    Type 2 diabetes, uncontrolled, with neuropathy ([place])    Check A1c. Has not been taking metformin XL as prescribed but rather PRN. Sugars fluctuating as a result. Suggested he start daily metformin, start at 1-2 tablets a day and see if better sugar control  achieved with this.       Relevant Orders   Hemoglobin A1c (Completed)   Basic metabolic panel (Completed)   Encounter for anticoagulation discussion and counseling    Fluctuating INRs noted. See above.       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 He is overusing albuterol which has led to decreased effect each pump - reviewed this. Will Rx short prednisone taper and advised he restart daily spiriva.       Relevant Medications   tiotropium (SPIRIVA HANDIHALER) 18 MCG inhalation capsule   albuterol (VENTOLIN HFA) 108 (90 Base) MCG/ACT inhaler   predniSONE (DELTASONE) 20 MG tablet   Atrial fibrillation (HCC)    On coumadin and amiodarone.  Cardiology recommends transition off coumadin onto DOAC (eliquis) - this is a good idea.           Meds ordered this encounter  Medications  . tiotropium (SPIRIVA HANDIHALER) 18 MCG inhalation capsule    Sig: Place 1 capsule (18 mcg total) into inhaler and inhale daily.    Dispense:  30 capsule    Refill:  11    Formulate for patient affordability/insurance coverage (handihaler vs respimat)  . albuterol (VENTOLIN HFA) 108 (90 Base) MCG/ACT inhaler    Sig: INHALE 2 PUFFS BY MOUTH EVERY 6 HOURS AS NEEDED FOR WHEEZING OR SHORTNESS OF BREATH    Dispense:  18 g    Refill:  3  . predniSONE (DELTASONE) 20 MG tablet    Sig: Take two tablets daily for 3 days followed by one tablet daily for 3 days    Dispense:  9 tablet    Refill:  0   Orders Placed This Encounter  Procedures  . Hemoglobin A1c  . Basic metabolic panel    Patient Instructions  Restart spiriva - controller daily medicine for COPD.  Albuterol refilled.  Take prednisone taper to help current lung inflamation while spiriva kicks in. Start metformin XR 500mg  once daily - with option to take more depending on sugar readings. Metformin will take at least several weeks to take full effect.  Labs today (A1c and kidney check).  Schedule physical for after May 11th.    Follow up plan: No follow-ups on file.  Ria Bush, MD

## 2020-01-20 NOTE — Patient Instructions (Addendum)
Restart spiriva - controller daily medicine for COPD. Albuterol refilled.  Take prednisone taper to help current lung inflamation while spiriva kicks in. Start metformin XR 500mg  once daily - with option to take more depending on sugar readings. Metformin will take at least several weeks to take full effect.  Labs today (A1c and kidney check).  Schedule physical for after May 11th.

## 2020-01-22 NOTE — Assessment & Plan Note (Addendum)
Fluctuating INRs noted. See above.

## 2020-01-22 NOTE — Assessment & Plan Note (Signed)
On coumadin and amiodarone.  Cardiology recommends transition off coumadin onto DOAC (eliquis) - this is a good idea.

## 2020-01-22 NOTE — Assessment & Plan Note (Signed)
Reviewed difference between rescue inhaler and controller medication. He is overusing albuterol which has led to decreased effect each pump - reviewed this. Will Rx short prednisone taper and advised he restart daily spiriva.

## 2020-01-22 NOTE — Assessment & Plan Note (Signed)
Check A1c. Has not been taking metformin XL as prescribed but rather PRN. Sugars fluctuating as a result. Suggested he start daily metformin, start at 1-2 tablets a day and see if better sugar control achieved with this.

## 2020-01-23 ENCOUNTER — Ambulatory Visit (INDEPENDENT_AMBULATORY_CARE_PROVIDER_SITE_OTHER): Payer: Medicare Other

## 2020-01-23 ENCOUNTER — Other Ambulatory Visit: Payer: Self-pay

## 2020-01-23 ENCOUNTER — Other Ambulatory Visit: Payer: Medicare Other

## 2020-01-23 ENCOUNTER — Telehealth: Payer: Self-pay

## 2020-01-23 DIAGNOSIS — I4891 Unspecified atrial fibrillation: Secondary | ICD-10-CM

## 2020-01-23 DIAGNOSIS — Z5181 Encounter for therapeutic drug level monitoring: Secondary | ICD-10-CM

## 2020-01-23 LAB — POCT INR: INR: 3 (ref 2.0–3.0)

## 2020-01-23 NOTE — Patient Instructions (Signed)
-   then START NEW DOSAGE of warfarin 1/2 tablet every day EXCEPT 1 TABLET ON MONDAYS & FRIDAYS.  Recheck INR in 3 weeks.

## 2020-01-23 NOTE — Telephone Encounter (Signed)
Patient had help calling in and while asking her questions, patient was yelling in the background. Patient would yell "I only need to speak with [facility]" "just have her call"  Patient was instructed that he would get a call when able

## 2020-01-23 NOTE — Telephone Encounter (Signed)
Spoke w/ pt.  He is going out of town this afternoon and wants to come in early. He asked if he could come in b/t 1:00-2:00 but I do not have any openings at that time. Advised him that my 11:45 pt got here early, so he can come in then.  He verified w/ Juliann Pulse and they will be here then. He is appreciative of the call.

## 2020-01-23 NOTE — Telephone Encounter (Signed)
Patient calling in needing to speak with coumadin nurse. Does not want to give much information over the phone other than he is going out of town and wants to talk about another medication  Please advise when able

## 2020-01-26 ENCOUNTER — Other Ambulatory Visit: Payer: Self-pay | Admitting: Cardiovascular Disease

## 2020-01-31 ENCOUNTER — Encounter: Payer: Medicare Other | Admitting: Family Medicine

## 2020-02-01 ENCOUNTER — Other Ambulatory Visit: Payer: Self-pay | Admitting: Family Medicine

## 2020-02-01 DIAGNOSIS — G894 Chronic pain syndrome: Secondary | ICD-10-CM

## 2020-02-01 NOTE — Telephone Encounter (Signed)
Name of Medication: Hydrocodone-APAP Name of Pharmacy: [facility] Last Fill or Written Date and Quantity: 01/06/20, #120/0 Last Office Visit and Type: 01/20/20, discuss meds Next Office Visit and Type: none Last Controlled Substance Agreement Date: 07/06/17 Last UDS: 07/06/17

## 2020-02-03 MED ORDER — HYDROCODONE-ACETAMINOPHEN 10-325 MG PO TABS: 1.0000 | ORAL_TABLET | Freq: Four times a day (QID) | ORAL | 0 refills | Status: DC | PRN

## 2020-02-03 NOTE — Telephone Encounter (Signed)
ERx 

## 2020-02-03 NOTE — Telephone Encounter (Signed)
Patient's wife called to check on refill request, She stated that the patient is out of medication

## 2020-02-14 ENCOUNTER — Encounter: Payer: Self-pay | Admitting: Family Medicine

## 2020-02-15 ENCOUNTER — Ambulatory Visit (INDEPENDENT_AMBULATORY_CARE_PROVIDER_SITE_OTHER): Payer: Medicare Other | Admitting: Family Medicine

## 2020-02-15 ENCOUNTER — Ambulatory Visit (INDEPENDENT_AMBULATORY_CARE_PROVIDER_SITE_OTHER)
Admission: RE | Admit: 2020-02-15 | Discharge: 2020-02-15 | Disposition: A | Discharge status: Home / Self Care | Payer: Medicare Other | Source: Ambulatory Visit | Attending: Family Medicine | Admitting: Family Medicine

## 2020-02-15 ENCOUNTER — Encounter: Payer: Self-pay | Admitting: Family Medicine

## 2020-02-15 ENCOUNTER — Other Ambulatory Visit: Payer: Self-pay

## 2020-02-15 ENCOUNTER — Ambulatory Visit (INDEPENDENT_AMBULATORY_CARE_PROVIDER_SITE_OTHER): Payer: Medicare Other

## 2020-02-15 DIAGNOSIS — R0789 Other chest pain: Secondary | ICD-10-CM

## 2020-02-15 DIAGNOSIS — R079 Chest pain, unspecified: Secondary | ICD-10-CM | POA: Diagnosis not present

## 2020-02-15 DIAGNOSIS — S8991XA Unspecified injury of right lower leg, initial encounter: Secondary | ICD-10-CM

## 2020-02-15 DIAGNOSIS — J449 Chronic obstructive pulmonary disease, unspecified: Secondary | ICD-10-CM | POA: Diagnosis not present

## 2020-02-15 DIAGNOSIS — Z7189 Other specified counseling: Secondary | ICD-10-CM | POA: Diagnosis not present

## 2020-02-15 DIAGNOSIS — G894 Chronic pain syndrome: Secondary | ICD-10-CM

## 2020-02-15 DIAGNOSIS — Z7901 Long term (current) use of anticoagulants: Secondary | ICD-10-CM

## 2020-02-15 DIAGNOSIS — M79604 Pain in right leg: Secondary | ICD-10-CM | POA: Diagnosis not present

## 2020-02-15 DIAGNOSIS — I714 Abdominal aortic aneurysm, without rupture, unspecified: Secondary | ICD-10-CM

## 2020-02-15 DIAGNOSIS — G8929 Other chronic pain: Secondary | ICD-10-CM | POA: Diagnosis not present

## 2020-02-15 DIAGNOSIS — I7141 Pararenal abdominal aortic aneurysm, without rupture: Secondary | ICD-10-CM

## 2020-02-15 DIAGNOSIS — R1012 Left upper quadrant pain: Secondary | ICD-10-CM | POA: Diagnosis not present

## 2020-02-15 DIAGNOSIS — I4891 Unspecified atrial fibrillation: Secondary | ICD-10-CM

## 2020-02-15 LAB — POCT INR: INR: 4.6 — AB (ref 2.0–3.0)

## 2020-02-15 NOTE — Patient Instructions (Signed)
Pre visit review using our clinic review tool, if applicable. No additional management support is needed unless otherwise documented below in the visit note.    Hold dose today and hold dose tomorrow. Pt has f/u apt for coumadin management with cardiology on 3/15.

## 2020-02-15 NOTE — Patient Instructions (Addendum)
Check coumadin today - high. Follow new instructions for next 5 days, keep coumadin clinic appointment. xrays today
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
## 2020-02-16 DIAGNOSIS — R1012 Left upper quadrant pain: Secondary | ICD-10-CM | POA: Insufficient documentation
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
## 2020-02-16 NOTE — Assessment & Plan Note (Signed)
See above re concern for spleen injury.  Reviewed red flags to seek ER care even if in [place].  Wife will call me tomorrow with update.

## 2020-02-16 NOTE — Assessment & Plan Note (Signed)
Update INR - high at 4.6. advised hold coumadin for next 2 days then return to previous dosing, keep coumadin clinic appointment on Monday.
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
## 2020-02-16 NOTE — Assessment & Plan Note (Signed)
Continued smoker.  He has restarted spiriva at night.  Again reviewed albuterol use - he had been taking up to 3 pumps at once. rec 1 pump with second pump 5 minutes later. Again reviewed correct albuterol inhaler administration.

## 2020-02-16 NOTE — Assessment & Plan Note (Signed)
S/p fenestrated endovascular AAA repair 09/2016

## 2020-02-20 ENCOUNTER — Other Ambulatory Visit: Payer: Self-pay

## 2020-02-20 ENCOUNTER — Ambulatory Visit (INDEPENDENT_AMBULATORY_CARE_PROVIDER_SITE_OTHER): Payer: Medicare Other

## 2020-02-20 DIAGNOSIS — Z5181 Encounter for therapeutic drug level monitoring: Secondary | ICD-10-CM | POA: Diagnosis not present

## 2020-02-20 DIAGNOSIS — I4891 Unspecified atrial fibrillation: Secondary | ICD-10-CM | POA: Diagnosis not present

## 2020-02-20 LAB — POCT INR: INR: 2.8 (ref 2.0–3.0)
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
## 2020-02-20 NOTE — Patient Instructions (Signed)
START NEW DOSAGE of warfarin 1/2 tablet every day. Recheck INR in 2 weeks.

## 2020-02-23 ENCOUNTER — Other Ambulatory Visit: Payer: Self-pay | Admitting: Family Medicine

## 2020-02-26 ENCOUNTER — Other Ambulatory Visit: Payer: Self-pay | Admitting: Family Medicine

## 2020-02-28 NOTE — Telephone Encounter (Signed)
Sent in 3 inhalers which should last 3 months

## 2020-02-28 NOTE — Telephone Encounter (Signed)
Per pharmacy, pt is requesting a 90-day supply.

## 2020-02-29 ENCOUNTER — Telehealth: Payer: Self-pay | Admitting: Cardiovascular Disease

## 2020-02-29 NOTE — Telephone Encounter (Signed)
Pt c/o medication issue:  1. Name of Medication: amiodarone   2. How are you currently taking this medication (dosage and times per day)? 200 MG 1 tablet 3 times daily   3. Are you having a reaction (difficulty breathing--STAT)? HR has been low  4. What is your medication issue? Patient calling, has run out of medication because he has been taking medication 3 times daily instead of 2 times daily.  States that his HR gets too low but when he takes 3 his HR is where it should be.  Patient would need a refill but would like for prescription to notify the 3 times daily.  Please call to discuss and send in refill to [facility] on [address]

## 2020-03-01 ENCOUNTER — Telehealth: Payer: Self-pay

## 2020-03-01 ENCOUNTER — Telehealth: Payer: Self-pay | Admitting: Cardiovascular Disease

## 2020-03-01 ENCOUNTER — Other Ambulatory Visit: Payer: Self-pay | Admitting: Family Medicine

## 2020-03-01 ENCOUNTER — Other Ambulatory Visit: Payer: Self-pay | Admitting: Cardiovascular Disease

## 2020-03-01 ENCOUNTER — Other Ambulatory Visit: Payer: Self-pay

## 2020-03-01 DIAGNOSIS — G894 Chronic pain syndrome: Secondary | ICD-10-CM

## 2020-03-01 MED ORDER — WARFARIN SODIUM 5 MG PO TABS: ORAL_TABLET | ORAL | 0 refills | Status: DC

## 2020-03-01 NOTE — Addendum Note (Signed)
Addended by: Johny Shock B on: 03/01/2020 11:34 AM   Modules accepted: Orders

## 2020-03-01 NOTE — Telephone Encounter (Signed)
Called and spoke to pt. Pt stated that when he blows his nose he will notice some blood. However, it is not every time he blows. Offered to schedule pt an appointment to get INR checked. Pt stated that he is driving to [place] right now and will not be back until Sunday. Pt stated he plans on going to his appointment on 03/05/2020 to get his INR checked. Informed pt that if his nose starts to bleed continuously then he should seek medical attention.

## 2020-03-01 NOTE — Telephone Encounter (Signed)
Please review for refill, Thanks !  

## 2020-03-01 NOTE — Telephone Encounter (Signed)
Pharmacy requesting Warfarin 5MG  tablets.

## 2020-03-01 NOTE — Telephone Encounter (Signed)
[facility] signed said that pt is out of town until this weekend; for the past 2 mornings when pt wakes up he has a nose bleed; pt clamps his nose and the bleed does not last long; pt is on blood thinner, warfarin and has appt on 03/05/20 to have coumadin ck at Dr Donivan Scull office in [place]. After nose bleed stops does not have any more problems for the rest of the day. Today pt blowed his nose and a small amt of blood came out then but no nose bleed. Today BP 140/80 P in the 50s. Pt does not have H/A,dizziness, CP or SOB. Pt said he is feeling great. Pt wants to know what Dr Darnell Level thinks is causing a nose bleed. Pt request cb. UC & ED precautions given and [name] voiced understanding.

## 2020-03-01 NOTE — Telephone Encounter (Signed)
If Home Health RN is calling please get Coumadin Nurse on the phone STAT  1.  Are you calling in regards to an appointment? n/a  2.  Are you calling for a refill ? n/a  3.  Are you having bleeding issues? Yes, when blowing nose blood comes out. More in the morning but all through out the day. Bright red  4.  Do you need clearance to hold Coumadin? n/a   Please route to the Coumadin Clinic Pool

## 2020-03-01 NOTE — Telephone Encounter (Signed)
Dr Darnell Level said pt can use nasal saline and vaseline in nose; if has another nose bleed clamp down on nose and if nose bleed persist go to UC or ED. Pt should keep his coumadin clinic appt at Dr Gwenyth Ober office in [place] on 03/05/20 at 3:15. Pt wanted to know if he should stop his coumadin and pt was advised per DR G should not stop coumadin. UC & ED precautions given and pt voiced understanding. FYI to Dr Darnell Level.

## 2020-03-02 MED ORDER — HYDROCODONE-ACETAMINOPHEN 10-325 MG PO TABS: 1.0000 | ORAL_TABLET | Freq: Four times a day (QID) | ORAL | 0 refills | Status: DC | PRN
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
## 2020-03-02 NOTE — Telephone Encounter (Signed)
ERx 

## 2020-03-02 NOTE — Telephone Encounter (Signed)
Would not write the prescription for amiodarone at high dosing He is supposed to be taking amiodarone 200 mg daily Perhaps an extra amiodarone for breakthrough atrial fibrillation spell I do not want him on this twice a day indefinitely let alone 3 times a day, it is not designed for that long-term  Options include Staying on amiodarone 200 mg daily occasional extra 200 mg dose   increasing his metoprolol dosing, would confirm he is on this Also taking diltiazem 30 mg pill for breakthrough episodes He has had difficulty with his paroxysmal atrial fibrillation, he did meet with EP Perhaps he would benefit from a ablation

## 2020-03-05 ENCOUNTER — Ambulatory Visit (INDEPENDENT_AMBULATORY_CARE_PROVIDER_SITE_OTHER): Payer: Medicare Other

## 2020-03-05 ENCOUNTER — Other Ambulatory Visit: Payer: Self-pay

## 2020-03-05 DIAGNOSIS — Z5181 Encounter for therapeutic drug level monitoring: Secondary | ICD-10-CM | POA: Diagnosis not present

## 2020-03-05 DIAGNOSIS — I4891 Unspecified atrial fibrillation: Secondary | ICD-10-CM | POA: Diagnosis not present

## 2020-03-05 LAB — POCT INR: INR: 2.6 (ref 2.0–3.0)

## 2020-03-05 MED ORDER — AMIODARONE HCL 200 MG PO TABS: 200.0000 mg | ORAL_TABLET | ORAL | 0 refills | Status: DC

## 2020-03-05 NOTE — Telephone Encounter (Signed)
Patient came in today to have his coumadin checked and was then placed in room 4 to discuss his recent call for refill. His wife did accompany him today and we reviewed his medications. He is NOT taking metoprolol which I reviewed can help with heart rate control and rhythm. Wife confirmed that he does take diltiazem 30 mg as needed for fast heart rate. Patient states that he needs more amiodarone. He reports taking it twice a day, sometimes 3 times a day, and then 4 times a day depending on his heart rate. His wife said "you are doing fine and working on the twice a day". Reviewed that taking higher doses can cause potential toxicity and that based on our records he should only be taking once daily with extra dose as needed for breakthrough afib. Reviewed provider recommendations that he should not be taking this medication at increased doses and recommends a referral to see EP about potential ablation. Patient became upset stating that he did not want to see them and he was not going to let them do anything to his heart. Patients wife reviewed appointment is out some time and would like that to be moved up to discuss this with Dr. Rockey Situ. She did report she would come with him to this appointment given that he sometimes he has trouble with hearing and walking. Scheduling did work with wife to get next available to discuss the risks of the high dose amiodarone. She was appreciative for the time and had no further questions for now.

## 2020-03-05 NOTE — Patient Instructions (Signed)
-   continue dosage of warfarin 1/2 tablet every day. - recheck INR in 3 weeks.

## 2020-03-05 NOTE — Progress Notes (Signed)
Pt presented to the office w/ his wife, [name].  Pt is not feeling well today, so he was quiet, but pleasant.   Pt mentioned that he had a nosebleed while driving out of town this past week, but this improved after he tried saline nasal spray per recommendation from our office. At the end of the visit, pt asked to speak w/ Dr. Rockey Situ, as he would like to know if he should proceed w/ COVID vaccine tomorrow. Advised him that Dr. Rockey Situ was in clinic, but I could get a message to him. [name] stated that they called the office last week w/ a question about pt's amiodarone dosage and had not heard back. She reports that pt received the refill for amiodarone, but had not heard back from Dr. Rockey Situ about the dosage that he recommends. [name] asked to speak w/ Dr. Donivan Scull nurse, as she is frustrated that they have not heard back. Advised her that Dr. Donivan Scull nurse is in clinic, as well, but she asked to speak w/ her since she is in the office and did not want to leave w/o an answer. Placed pt and his wife in room 4 and notified Pam that they would like to speak w/ her regarding the phone call from 02/29/20. Pt and wife are appreciative.

## 2020-03-06 ENCOUNTER — Telehealth: Payer: Self-pay | Admitting: Family Medicine

## 2020-03-06 NOTE — Progress Notes (Signed)
Amio 200 mg daily With extra dose sparingly To avoid pulmonary toxicity, tyroid issue and eye problems Can we let him know the dose thanks

## 2020-03-06 NOTE — Progress Notes (Signed)
  Chronic Care Management   Note  03/06/2020 Name: [name] MRN: [id number] DOB: 04/26/1946  [name] is a 74 y.o. year old male who is a primary care patient of Ria Bush, MD. I reached out to [name] by phone today in response to a referral sent by Mr. [name]'s PCP, Ria Bush, MD.   [name] was given information about Chronic Care Management services today including:  1. CCM service includes personalized support from designated clinical staff supervised by his physician, including individualized plan of care and coordination with other care providers 2. 24/7 contact phone numbers for assistance for urgent and routine care needs. 3. Service will only be billed when office clinical staff spend 20 minutes or more in a month to coordinate care. 4. Only one practitioner may furnish and bill the service in a calendar month. 5. The patient may stop CCM services at any time (effective at the end of the month) by phone call to the office staff.   Patient agreed to services and verbal consent obtained.   Follow up plan:   Raynicia Dukes [employer] Scheduler

## 2020-03-07 NOTE — Progress Notes (Signed)
Patient has appointment on Monday with provider to review recommendations. I reviewed with patient and his wife the risks of taking extra doses. Only sent in one script until he can review his concerns and questions with provider at upcoming appointment.
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
## 2020-03-12 ENCOUNTER — Encounter: Payer: Self-pay | Admitting: Cardiovascular Disease

## 2020-03-12 ENCOUNTER — Other Ambulatory Visit: Payer: Self-pay

## 2020-03-12 ENCOUNTER — Ambulatory Visit (INDEPENDENT_AMBULATORY_CARE_PROVIDER_SITE_OTHER): Payer: Medicare Other | Admitting: Cardiovascular Disease

## 2020-03-12 VITALS — BP 116/64 | HR 47 | Ht 72.0 in | Wt 236.0 lb

## 2020-03-12 DIAGNOSIS — I77810 Thoracic aortic ectasia: Secondary | ICD-10-CM

## 2020-03-12 DIAGNOSIS — I495 Sick sinus syndrome: Secondary | ICD-10-CM

## 2020-03-12 DIAGNOSIS — E782 Mixed hyperlipidemia: Secondary | ICD-10-CM

## 2020-03-12 DIAGNOSIS — J432 Centrilobular emphysema: Secondary | ICD-10-CM | POA: Diagnosis not present

## 2020-03-12 DIAGNOSIS — I10 Essential (primary) hypertension: Secondary | ICD-10-CM

## 2020-03-12 DIAGNOSIS — I4891 Unspecified atrial fibrillation: Secondary | ICD-10-CM | POA: Diagnosis not present

## 2020-03-12 DIAGNOSIS — I2721 Secondary pulmonary arterial hypertension: Secondary | ICD-10-CM

## 2020-03-12 MED ORDER — AMIODARONE HCL 200 MG PO TABS: 300.0000 mg | ORAL_TABLET | Freq: Every day | ORAL | 3 refills | Status: DC

## 2020-03-12 MED ORDER — METOPROLOL SUCCINATE ER 25 MG PO TB24: 12.5000 mg | ORAL_TABLET | Freq: Every day | ORAL | 3 refills | Status: DC

## 2020-03-12 NOTE — Patient Instructions (Addendum)
Medication Instructions:  Your physician has recommended you make the following change in your medication:  1. DECREASE Amiodarone 200 mg to 1 and 1/2 tablet (300 mg) once daily 2. START Metoprolol succinate 12.5 mg once daily   If you need a refill on your cardiac medications before your next appointment, please call your pharmacy.    Lab work: No new labs needed   If you have labs (blood work) drawn today and your tests are completely normal, you will receive your results only by: Marland Kitchen MyChart Message (if you have MyChart) OR . A paper copy in the mail If you have any lab test that is abnormal or we need to change your treatment, we will call you to review the results.   Testing/Procedures: No new testing needed   Follow-Up: At [facility], you and your health needs are our priority.  As part of our continuing mission to provide you with exceptional heart care, we have created designated Provider Care Teams.  These Care Teams include your primary Cardiologist (physician) and Advanced Practice Providers (APPs -  Physician Assistants and Nurse Practitioners) who all work together to provide you with the care you need, when you need it.  . You will need a follow up appointment in 6 months   . Providers on your designated Care Team:   . Murray Hodgkins, NP . Christell Faith, PA-C . Marrianne Mood, PA-C  Any Other Special Instructions Will Be Listed Below (If Applicable).  For educational health videos Log in to : [email or web address] Or : [email or web address], password : [email or web address]

## 2020-03-22 ENCOUNTER — Inpatient Hospital Stay: Payer: Medicare Other | Attending: Radiation Oncology

## 2020-03-22 ENCOUNTER — Other Ambulatory Visit: Payer: Self-pay

## 2020-03-22 DIAGNOSIS — C61 Malignant neoplasm of prostate: Secondary | ICD-10-CM | POA: Insufficient documentation

## 2020-03-22 LAB — PSA: Prostatic Specific Antigen: 0.03 ng/mL (ref 0.00–4.00)

## 2020-03-23 ENCOUNTER — Telehealth: Payer: Self-pay

## 2020-03-23 DIAGNOSIS — I10 Essential (primary) hypertension: Secondary | ICD-10-CM

## 2020-03-23 DIAGNOSIS — I4891 Unspecified atrial fibrillation: Secondary | ICD-10-CM

## 2020-03-23 NOTE — Telephone Encounter (Signed)
Per written referral from PCP, requesting referral in Epic for [name] to chronic care management pharmacy services for the following conditions:   Essential hypertension, benign  [I10]  Atrial fibrillation [I48.91  [name], PharmD Clinical Pharmacist [facility] [phone]

## 2020-03-27 ENCOUNTER — Other Ambulatory Visit: Payer: Self-pay

## 2020-03-28 ENCOUNTER — Ambulatory Visit: Payer: Medicare Other

## 2020-03-28 DIAGNOSIS — E782 Mixed hyperlipidemia: Secondary | ICD-10-CM

## 2020-03-28 DIAGNOSIS — IMO0002 Reserved for concepts with insufficient information to code with codable children: Secondary | ICD-10-CM

## 2020-03-28 DIAGNOSIS — I509 Heart failure, unspecified: Secondary | ICD-10-CM

## 2020-03-28 DIAGNOSIS — J449 Chronic obstructive pulmonary disease, unspecified: Secondary | ICD-10-CM

## 2020-03-28 DIAGNOSIS — I4891 Unspecified atrial fibrillation: Secondary | ICD-10-CM

## 2020-03-28 DIAGNOSIS — I10 Essential (primary) hypertension: Secondary | ICD-10-CM

## 2020-03-28 DIAGNOSIS — E1140 Type 2 diabetes mellitus with diabetic neuropathy, unspecified: Secondary | ICD-10-CM
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
## 2020-03-29 ENCOUNTER — Ambulatory Visit
Admission: RE | Admit: 2020-03-29 | Discharge: 2020-03-29 | Disposition: A | Discharge status: Home / Self Care | Payer: Medicare Other | Source: Ambulatory Visit | Attending: Radiation Oncology | Admitting: Radiation Oncology

## 2020-03-29 DIAGNOSIS — C61 Malignant neoplasm of prostate: Secondary | ICD-10-CM | POA: Diagnosis not present

## 2020-03-29 NOTE — Progress Notes (Signed)
Radiation Oncology Follow up Note  Name: [name]   Date:   03/29/2020 MRN:  [id number] DOB: December 19, 1945   Radiation Oncology TeleHEALTH VISIT PROGRESS NOTE  I connected with     Melrose Nakayama  by telephone-Webex and verified that I am speaking with the correct person using two identifiers.  I discussed the limitations, risks, security and privacy concerns of performing an evaluation and management service by telemedicine and the availability of in-person appointments. I also discussed with the patient that there may be a patient responsible charge related to this service. The patient expressed understanding and agreed to proceed.    Other persons participating in the visit and their role in the encounter:    Patient's location: Home   Provider's location: work   This 74 y.o. male presents by telephone for follow-up.  He is a 74 year old male now 11 months out status post IMRT radiation therapy for a Gleason 7 (4+3) adenocarcinoma the prostate.  His initial PSA was 9.7.  I spoke with him on telephone he is doing extremely well specifically denies any increased lower urinary tract symptoms diarrhea or fatigue.  His most recent PSA is 0.03.  REFERRING PROVIDER: Ria Bush, MD  HPI: As above.  COMPLICATIONS OF TREATMENT: none  FOLLOW UP COMPLIANCE: keeps appointments   PHYSICAL EXAM:  There were no vitals taken for this visit. No physical exam was performed based on being a telephone visit  RADIOLOGY RESULTS: No films reviewed  PLAN: Present time patient is under excellent biochemical control of his prostate cancer and pleased with his overall progress.  I have asked to see him back in 1 year for follow-up with a PSA at that time.  Patient knows to call sooner with any concerns.  I would like to take this opportunity to thank you for allowing me to participate in the care of your patient.Noreene Filbert, MD

## 2020-03-30 ENCOUNTER — Other Ambulatory Visit: Payer: Self-pay | Admitting: Family Medicine

## 2020-03-30 DIAGNOSIS — G894 Chronic pain syndrome: Secondary | ICD-10-CM
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
## 2020-04-01 MED ORDER — HYDROCODONE-ACETAMINOPHEN 10-325 MG PO TABS: 1.0000 | ORAL_TABLET | Freq: Four times a day (QID) | ORAL | 0 refills | Status: DC | PRN

## 2020-04-01 NOTE — Telephone Encounter (Signed)
ERx 

## 2020-04-02 ENCOUNTER — Other Ambulatory Visit: Payer: Self-pay

## 2020-04-02 ENCOUNTER — Ambulatory Visit (INDEPENDENT_AMBULATORY_CARE_PROVIDER_SITE_OTHER): Payer: Medicare Other

## 2020-04-02 DIAGNOSIS — I4891 Unspecified atrial fibrillation: Secondary | ICD-10-CM

## 2020-04-02 DIAGNOSIS — Z5181 Encounter for therapeutic drug level monitoring: Secondary | ICD-10-CM | POA: Diagnosis not present

## 2020-04-02 LAB — POCT INR: INR: 2.7 (ref 2.0–3.0)

## 2020-04-02 MED ORDER — WARFARIN SODIUM 5 MG PO TABS: ORAL_TABLET | ORAL | 0 refills | Status: DC

## 2020-04-02 NOTE — Progress Notes (Signed)
Pt's INR is therapeutic today, so added a week to his next appt.  Asked pt and his wife if 4 wks would be good for them. Pt states the will not come in the week of 5/24 b/c that is Memorial Day.  Advised him that [facility] is the next week on 5/31. He states that his birthday is on [birth year] and he will not come back until June.  Advised pt that b/c his amiodarone dosage was changed, I cannot give him 6 wks until his next appt.  Pt's wife, [name], asks to come in 3 wks, on 5/17. Pt states that he will cancel that appt and come in June.  Pt's wife told him to behave and gestured for me to make the appt on 5/17.  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

## 2020-04-02 NOTE — Patient Instructions (Addendum)
-   continue warfarin dosage of warfarin 1/2 tablet every day. - recheck INR in 3 weeks.

## 2020-04-03 ENCOUNTER — Telehealth: Payer: Self-pay

## 2020-04-03 NOTE — Telephone Encounter (Signed)
Incoming fax from [place] requesting a 90 day supply of Warfarin 5MG  tablets.  Looks like a 30 day supply was sent in 04/02/2020.

## 2020-04-03 NOTE — Telephone Encounter (Signed)
90 day supply was sent yesterday. Called pharmacy who sated that pt has already picked up prescription.

## 2020-04-08 NOTE — Patient Instructions (Addendum)
Dear [name],  It was a pleasure meeting you during our initial appointment on March 28, 2020. Below is a summary of the goals we discussed and components of chronic care management. Please contact me anytime with questions or concerns.    Visit Information  Goals Addressed            This Visit's Progress   . Pharmacy Care Plan: Diabetes       CARE PLAN ENTRY  Current Barriers:  . Chronic Disease Management support, education, and care coordination needs related to Hyperlipidemia, Diabetes, and COPD   Hyperlipidemia . Pharmacist Clinical Goal(s): o Over the next 90 days, patient will work with PharmD and providers to achieve LDL goal < 100 . Current regimen:   Pravastatin 20 mg - 1 tablet daily  Fish oil 1000 mg - 1 capsule daily  . Interventions: o Recommend annual lipid panel, exercise as able, low cholesterol diet  . Patient self care activities - Over the next 90 days, patient will: o Review cholesterol content in foods handout and improve dietary choices  Diabetes . Pharmacist Clinical Goal(s): o Over the next 90 days, patient will work with PharmD and providers to achieve A1c goal < 7% . Current regimen:  o Metformin 500 mg - 2 tablets twice daily (taking 1 tablet daily) . Interventions: o Due to adverse abdominal effects with metformin, consider alternative therapy . Patient self care activities - Over the next 90 days, patient will: o Check check blood sugar daily before breakfast, document, and provide at future appointments o Contact provider with any episodes of hypoglycemia o Continue metformin once daily if tolerated  Initial goal documentation        [name] was given information about Chronic Care Management services today including:  1. CCM service includes personalized support from designated clinical staff supervised by his physician, including individualized plan of care and coordination with other care providers 2. 24/7 contact phone  numbers for assistance for urgent and routine care needs. 3. Standard insurance, coinsurance, copays and deductibles apply for chronic care management only during months in which we provide at least 20 minutes of these services. Most insurances cover these services at 100%, however patients may be responsible for any copay, coinsurance and/or deductible if applicable. This service may help you avoid the need for more expensive face-to-face services. 4. Only one practitioner may furnish and bill the service in a calendar month. 5. The patient may stop CCM services at any time (effective at the end of the month) by phone call to the office staff.  Patient agreed to services and verbal consent obtained.   The patient verbalized understanding of instructions provided today and agreed to receive a mailed copy of patient instruction and/or educational materials. Telephone follow up appointment with pharmacy team member scheduled for: Apr 13, 2020 at 9:00 AM (telephone)  [name], PharmD Clinical Pharmacist [facility] at [facility] [phone]

## 2020-04-09 NOTE — Progress Notes (Signed)
I have collaborated with the care management provider regarding care management and care coordination activities outlined in this encounter and have reviewed this encounter including documentation in the note and care plan. I am certifying that I agree with the content of this note and encounter as supervising physician.  

## 2020-04-13 ENCOUNTER — Telehealth: Payer: Self-pay

## 2020-04-13 ENCOUNTER — Telehealth: Payer: Medicare Other

## 2020-04-13 NOTE — Telephone Encounter (Signed)
Contacted patient's wife, [name], for CCM follow up visit today to discuss blood glucose readings. Due to busy schedule, she has not been able to check readings recently. Will begin checking and reschedule appt for 04/27/20 at 3:30 PM (telephone).  [name], PharmD Clinical Pharmacist [facility] at [facility] [phone]

## 2020-04-14 ENCOUNTER — Other Ambulatory Visit: Payer: Self-pay | Admitting: Family Medicine

## 2020-04-16 ENCOUNTER — Encounter: Payer: Self-pay | Admitting: Family Medicine

## 2020-04-16 ENCOUNTER — Telehealth: Payer: Self-pay | Admitting: Family Medicine

## 2020-04-16 ENCOUNTER — Other Ambulatory Visit: Payer: Self-pay

## 2020-04-16 ENCOUNTER — Other Ambulatory Visit (INDEPENDENT_AMBULATORY_CARE_PROVIDER_SITE_OTHER): Payer: Medicare Other

## 2020-04-16 ENCOUNTER — Other Ambulatory Visit: Payer: Self-pay | Admitting: Family Medicine

## 2020-04-16 ENCOUNTER — Ambulatory Visit (INDEPENDENT_AMBULATORY_CARE_PROVIDER_SITE_OTHER): Payer: Medicare Other | Admitting: Family Medicine

## 2020-04-16 DIAGNOSIS — IMO0002 Reserved for concepts with insufficient information to code with codable children: Secondary | ICD-10-CM

## 2020-04-16 DIAGNOSIS — C61 Malignant neoplasm of prostate: Secondary | ICD-10-CM

## 2020-04-16 DIAGNOSIS — T24032A Burn of unspecified degree of left lower leg, initial encounter: Secondary | ICD-10-CM | POA: Insufficient documentation

## 2020-04-16 DIAGNOSIS — E782 Mixed hyperlipidemia: Secondary | ICD-10-CM

## 2020-04-16 DIAGNOSIS — D509 Iron deficiency anemia, unspecified: Secondary | ICD-10-CM

## 2020-04-16 DIAGNOSIS — E1140 Type 2 diabetes mellitus with diabetic neuropathy, unspecified: Secondary | ICD-10-CM

## 2020-04-16 DIAGNOSIS — E559 Vitamin D deficiency, unspecified: Secondary | ICD-10-CM

## 2020-04-16 DIAGNOSIS — T24232A Burn of second degree of left lower leg, initial encounter: Secondary | ICD-10-CM

## 2020-04-16 DIAGNOSIS — E1165 Type 2 diabetes mellitus with hyperglycemia: Secondary | ICD-10-CM | POA: Diagnosis not present

## 2020-04-16 DIAGNOSIS — E538 Deficiency of other specified B group vitamins: Secondary | ICD-10-CM | POA: Diagnosis not present

## 2020-04-16 DIAGNOSIS — I10 Essential (primary) hypertension: Secondary | ICD-10-CM | POA: Diagnosis not present

## 2020-04-16 LAB — CBC WITH DIFFERENTIAL/PLATELET
Basophils Absolute: 0 10*3/uL (ref 0.0–0.1)
Basophils Relative: 0.4 % (ref 0.0–3.0)
Eosinophils Absolute: 0.1 10*3/uL (ref 0.0–0.7)
Eosinophils Relative: 1.1 % (ref 0.0–5.0)
HCT: 43.6 % (ref 39.0–52.0)
Hemoglobin: 14.6 g/dL (ref 13.0–17.0)
Lymphocytes Relative: 15.9 % (ref 12.0–46.0)
Lymphs Abs: 1.7 10*3/uL (ref 0.7–4.0)
MCHC: 33.5 g/dL (ref 30.0–36.0)
MCV: 92.9 fl (ref 78.0–100.0)
Monocytes Absolute: 0.8 10*3/uL (ref 0.1–1.0)
Monocytes Relative: 7.7 % (ref 3.0–12.0)
Neutro Abs: 7.9 10*3/uL — ABNORMAL HIGH (ref 1.4–7.7)
Neutrophils Relative %: 74.9 % (ref 43.0–77.0)
Platelets: 211 10*3/uL (ref 150.0–400.0)
RBC: 4.69 Mil/uL (ref 4.22–5.81)
RDW: 14.9 % (ref 11.5–15.5)
WBC: 10.6 10*3/uL — ABNORMAL HIGH (ref 4.0–10.5)

## 2020-04-16 LAB — COMPREHENSIVE METABOLIC PANEL
ALT: 15 U/L (ref 0–53)
AST: 15 U/L (ref 0–37)
Albumin: 3.6 g/dL (ref 3.5–5.2)
Alkaline Phosphatase: 82 U/L (ref 39–117)
BUN: 15 mg/dL (ref 6–23)
CO2: 29 mEq/L (ref 19–32)
Calcium: 8.7 mg/dL (ref 8.4–10.5)
Chloride: 100 mEq/L (ref 96–112)
Creatinine, Ser: 1.03 mg/dL (ref 0.40–1.50)
GFR: 70.22 mL/min (ref >60.00)
Glucose, Bld: 145 mg/dL — ABNORMAL HIGH (ref 70–99)
Potassium: 4.4 mEq/L (ref 3.5–5.1)
Sodium: 137 mEq/L (ref 135–145)
Total Bilirubin: 0.8 mg/dL (ref 0.2–1.2)
Total Protein: 6 g/dL (ref 6.0–8.3)

## 2020-04-16 LAB — LIPID PANEL
Cholesterol: 132 mg/dL (ref 0–200)
HDL: 34.3 mg/dL — ABNORMAL LOW (ref >39.00)
LDL Cholesterol: 75 mg/dL (ref 0–99)
NonHDL: 97.76
Total CHOL/HDL Ratio: 4
Triglycerides: 115 mg/dL (ref 0.0–149.0)
VLDL: 23 mg/dL (ref 0.0–40.0)

## 2020-04-16 LAB — MICROALBUMIN / CREATININE URINE RATIO
Creatinine,U: 150 mg/dL
Microalb Creat Ratio: 2 mg/g (ref 0.0–30.0)
Microalb, Ur: 3.1 mg/dL — ABNORMAL HIGH (ref 0.0–1.9)

## 2020-04-16 LAB — VITAMIN B12: Vitamin B-12: >1500 pg/mL — ABNORMAL HIGH (ref 211–911)

## 2020-04-16 LAB — HEMOGLOBIN A1C: Hgb A1c MFr Bld: 8 % — ABNORMAL HIGH (ref 4.6–6.5)

## 2020-04-16 LAB — FERRITIN: Ferritin: 40.2 ng/mL (ref 22.0–322.0)

## 2020-04-16 LAB — VITAMIN D 25 HYDROXY (VIT D DEFICIENCY, FRACTURES): VITD: 76.5 ng/mL (ref 30.00–100.00)

## 2020-04-16 MED ORDER — SILVER SULFADIAZINE 1 % EX CREA: 1.0000 "application " | TOPICAL_CREAM | Freq: Every day | CUTANEOUS | 0 refills | Status: DC

## 2020-04-16 MED ORDER — CEPHALEXIN 500 MG PO CAPS: 500.0000 mg | ORAL_CAPSULE | Freq: Three times a day (TID) | ORAL | 0 refills | Status: DC
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
## 2020-04-16 NOTE — Patient Instructions (Addendum)
Start antibiotic sent to pharmacy Use silvadene cream for dressing changes daily. Keep wound clinic appointment on Wednesday Watch for fever, streaking redness or other signs of infection and let us know right away if that happens.  Burn Care, Adult A burn is an injury to the skin or the tissues under the skin. There are three types of burns:  First degree. These burns may cause the skin to be red and a bit swollen.  Second degree. These burns are very painful and cause the skin to be very red. The skin may also leak fluid, look shiny, and start to have blisters.  Third degree. These burns cause permanent damage. They turn the skin white or black and make it look charred, dry, and leathery. Taking care of your burn properly can help to prevent pain and infection. It can also help the burn to heal more quickly. How is this treated? Right after a burn:  Rinse or soak the burn under cool water. Do this for several minutes. Do not put ice on your burn. That can cause more damage.  Lightly cover the burn with a clean (sterile) cloth (dressing). Burn care  Raise (elevate) the injured area above the level of your heart while sitting or lying down.  Follow instructions from your doctor about: ? How to clean and take care of the burn. ? When to change and remove the cloth.  Check your burn every day for signs of infection. Check for: ? More redness, swelling, or pain. ? Warmth. ? Pus or a bad smell. Medicine   Take over-the-counter and prescription medicines only as told by your doctor.  If you were prescribed antibiotic medicine, take or apply it as told by your doctor. Do not stop using the antibiotic even if your condition improves. General instructions  To prevent infection: ? Do not put butter, oil, or other home treatments on the burn. ? Do not scratch or pick at the burn. ? Do not break any blisters. ? Do not peel skin.  Do not rub your burn, even when you are cleaning  it.  Protect your burn from the sun. Contact a doctor if:  Your condition does not get better.  Your condition gets worse.  You have a fever.  Your burn looks different or starts to have black or red spots on it.  Your burn feels warm to the touch.  Your pain is not controlled with medicine. Get help right away if:  You have redness, swelling, or pain at the site of the burn.  You have fluid, blood, or pus coming from your burn.  You have red streaks near the burn.  You have very bad pain. This information is not intended to replace advice given to you by your health care provider. Make sure you discuss any questions you have with your health care provider. Document Revised: 03/16/2019 Document Reviewed: 05/13/2016 Elsevier Patient Education  [employer].
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
## 2020-04-16 NOTE — Telephone Encounter (Signed)
Spoke with pt offering Td shot.  Says he'll get it at 04/19/19 OV.  In too much pain right now.  FYI to Dr. Darnell Level.
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
## 2020-04-17 ENCOUNTER — Ambulatory Visit: Payer: Medicare Other | Admitting: Physician Assistant

## 2020-04-18 ENCOUNTER — Encounter: Payer: Medicare Other | Admitting: Family Medicine

## 2020-04-18 ENCOUNTER — Encounter: Payer: Medicare Other | Attending: Physician Assistant | Admitting: Internal Medicine

## 2020-04-18 ENCOUNTER — Other Ambulatory Visit: Payer: Self-pay

## 2020-04-18 DIAGNOSIS — E538 Deficiency of other specified B group vitamins: Secondary | ICD-10-CM | POA: Insufficient documentation

## 2020-04-18 DIAGNOSIS — Z7901 Long term (current) use of anticoagulants: Secondary | ICD-10-CM | POA: Insufficient documentation

## 2020-04-18 DIAGNOSIS — E785 Hyperlipidemia, unspecified: Secondary | ICD-10-CM | POA: Diagnosis not present

## 2020-04-18 DIAGNOSIS — G4730 Sleep apnea, unspecified: Secondary | ICD-10-CM | POA: Diagnosis not present

## 2020-04-18 DIAGNOSIS — I10 Essential (primary) hypertension: Secondary | ICD-10-CM | POA: Insufficient documentation

## 2020-04-18 DIAGNOSIS — L97821 Non-pressure chronic ulcer of other part of left lower leg limited to breakdown of skin: Secondary | ICD-10-CM | POA: Insufficient documentation

## 2020-04-18 DIAGNOSIS — E11622 Type 2 diabetes mellitus with other skin ulcer: Secondary | ICD-10-CM | POA: Insufficient documentation

## 2020-04-18 DIAGNOSIS — I714 Abdominal aortic aneurysm, without rupture: Secondary | ICD-10-CM | POA: Insufficient documentation

## 2020-04-18 DIAGNOSIS — I4891 Unspecified atrial fibrillation: Secondary | ICD-10-CM | POA: Insufficient documentation

## 2020-04-18 DIAGNOSIS — Z8546 Personal history of malignant neoplasm of prostate: Secondary | ICD-10-CM | POA: Insufficient documentation

## 2020-04-18 DIAGNOSIS — L97111 Non-pressure chronic ulcer of right thigh limited to breakdown of skin: Secondary | ICD-10-CM | POA: Insufficient documentation

## 2020-04-18 DIAGNOSIS — T24232A Burn of second degree of left lower leg, initial encounter: Secondary | ICD-10-CM | POA: Diagnosis not present

## 2020-04-18 DIAGNOSIS — E1142 Type 2 diabetes mellitus with diabetic polyneuropathy: Secondary | ICD-10-CM | POA: Diagnosis not present

## 2020-04-18 DIAGNOSIS — D509 Iron deficiency anemia, unspecified: Secondary | ICD-10-CM | POA: Diagnosis not present

## 2020-04-18 DIAGNOSIS — M1990 Unspecified osteoarthritis, unspecified site: Secondary | ICD-10-CM | POA: Diagnosis not present

## 2020-04-19 ENCOUNTER — Telehealth: Payer: Self-pay | Admitting: Family Medicine
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
## 2020-04-19 NOTE — Progress Notes (Addendum)
[name], [name] ([id number]) Visit Report for 04/18/2020 Abuse/Suicide Risk Screen Details Patient Name: [name], [name] Date of Service: 04/18/2020 3:00 PM Medical Record Number: [id number] Patient Account Number: [id number] Date of Birth/Sex: 06-18-1946 (74 y.o. M) Treating RN: Army Melia Primary Care Adib Wahba: Ria Bush Other Clinician: Referring Lavere Stork: Ria Bush Treating Kelisha Dall/Extender: Tito Dine in Treatment: 0 Abuse/Suicide Risk Screen Items Answer ABUSE RISK SCREEN: Has anyone close to you tried to hurt or harm you recentlyo No Do you feel uncomfortable with anyone in your familyo No Has anyone forced you do things that you didnot want to doo No Electronic Signature(s) Signed: 04/20/2020 8:22:49 AM By: Gretta Cool, BSN, RN, CWS, Kim RN, BSN Signed: 04/24/2020 10:45:21 AM By: Army Melia Previous Signature: 04/19/2020 11:19:24 AM Version By: Army Melia Entered By: Gretta Cool BSN, RN, CWS, Kim on 04/20/2020 08:22:49 [name] ([id number]) -------------------------------------------------------------------------------- Activities of Daily Living Details Patient Name: [name] Date of Service: 04/18/2020 3:00 PM Medical Record Number: [id number] Patient Account Number: [id number] Date of Birth/Sex: December 10, 1945 (73 y.o. M) Treating RN: Army Melia Primary Care Layloni Fahrner: Ria Bush Other Clinician: Referring Darren Nodal: Ria Bush Treating Jalan Fariss/Extender: Tito Dine in Treatment: 0 Activities of Daily Living Items Answer Activities of Daily Living (Please select one for each item) Drive Automobile Completely Able Take Medications Completely Able Use Telephone Completely Able Care for Appearance Completely Able Use Toilet Completely Able Bath / Shower Completely Able Dress Self Completely Able Feed Self Completely Able Walk Completely Able Get In / Out Bed Completely Able Housework Completely Able Prepare  Meals Completely [place] for Self Completely Able Electronic Signature(s) Signed: 04/20/2020 8:22:56 AM By: Gretta Cool, BSN, RN, CWS, Kim RN, BSN Signed: 04/24/2020 10:45:21 AM By: Army Melia Previous Signature: 04/19/2020 11:19:24 AM Version By: Army Melia Entered By: Gretta Cool BSN, RN, CWS, Kim on 04/20/2020 08:22:56 [name] ([id number]) -------------------------------------------------------------------------------- Education Screening Details Patient Name: [name] Date of Service: 04/18/2020 3:00 PM Medical Record Number: [id number] Patient Account Number: [id number] Date of Birth/Sex: 05/14/1946 (74 y.o. M) Treating RN: Army Melia Primary Care Bentli Llorente: Ria Bush Other Clinician: Referring Tamer Baughman: Ria Bush Treating Naturi Alarid/Extender: Tito Dine in Treatment: 0 Primary Learner Assessed: Patient Learning Preferences/Education Level/Primary Language Learning Preference: Explanation Highest Education Level: College or Above Preferred Language: English Cognitive Barrier Language Barrier: No Translator Needed: No Memory Deficit: No Emotional Barrier: No Cultural/Religious Beliefs Affecting Medical Care: No Physical Barrier Impaired Vision: No Impaired Hearing: No Decreased Hand dexterity: No Knowledge/Comprehension Knowledge Level: High Comprehension Level: High Ability to understand written instructions: High Ability to understand verbal instructions: High Motivation Anxiety Level: Calm Cooperation: Cooperative Education Importance: Acknowledges Need Interest in Health Problems: Asks Questions Perception: Coherent Willingness to Engage in Self-Management High Activities: Readiness to Engage in Self-Management High Activities: Electronic Signature(s) Signed: 04/20/2020 8:23:21 AM By: Gretta Cool, BSN, RN, CWS, Kim RN, BSN Signed: 04/24/2020 10:45:21 AM By: Army Melia Previous Signature: 04/19/2020  11:19:24 AM Version By: Army Melia Entered By: Gretta Cool BSN, RN, CWS, Kim on 04/20/2020 08:23:21 [name] ([id number]) -------------------------------------------------------------------------------- Fall Risk Assessment Details Patient Name: [name] Date of Service: 04/18/2020 3:00 PM Medical Record Number: [id number] Patient Account Number: [id number] Date of Birth/Sex: 10-16-1946 (75 y.o. M) Treating RN: Army Melia Primary Care Needham Biggins: Ria Bush Other Clinician: Referring Madalynne Gutmann: Ria Bush Treating Oluwaseyi Raffel/Extender: Tito Dine in Treatment: 0 Fall Risk Assessment Items Have you had 2 or more falls in the last 12 monthso  0 Yes Have you had any fall that resulted in injury in the last 12 monthso 0 No FALLS RISK SCREEN History of falling - immediate or within 3 months 25 Yes Secondary diagnosis (Do you have 2 or more medical diagnoseso) 0 No Ambulatory aid None/bed rest/wheelchair/nurse 0 No Crutches/cane/walker 15 Yes Furniture 0 No Intravenous therapy Access/Saline/Heparin Lock 0 No Gait/Transferring Normal/ bed rest/ wheelchair 0 No Weak (short steps with or without shuffle, stooped but able to lift head while walking, may 0 No seek support from furniture) Impaired (short steps with shuffle, may have difficulty arising from chair, head down, impaired 0 No balance) Mental Status Oriented to own ability 0 No Electronic Signature(s) Signed: 04/20/2020 8:23:28 AM By: Gretta Cool, BSN, RN, CWS, Kim RN, BSN Signed: 04/24/2020 10:45:21 AM By: Army Melia Previous Signature: 04/19/2020 11:19:24 AM Version By: Army Melia Entered By: Gretta Cool BSN, RN, CWS, Kim on 04/20/2020 08:23:28 [name] ([id number]) -------------------------------------------------------------------------------- Foot Assessment Details Patient Name: [name] Date of Service: 04/18/2020 3:00 PM Medical Record Number: [id number] Patient Account Number:  [id number] Date of Birth/Sex: 1946/05/29 (74 y.o. M) Treating RN: Army Melia Primary Care Mansel Strother: Ria Bush Other Clinician: Referring Mauro Arps: Ria Bush Treating Tayelor Osborne/Extender: Tito Dine in Treatment: 0 Foot Assessment Items Site Locations + = Sensation present, - = Sensation absent, C = Callus, U = Ulcer R = Redness, W = Warmth, M = Maceration, PU = Pre-ulcerative lesion F = Fissure, S = Swelling, D = Dryness Assessment Right: Left: Other Deformity: No No Prior Foot Ulcer: No No Prior Amputation: No No Charcot Joint: No No Ambulatory Status: Ambulatory With Help Assistance Device: Cane Gait: Steady Electronic Signature(s) Signed: 04/20/2020 8:23:48 AM By: Gretta Cool, BSN, RN, CWS, Kim RN, BSN Signed: 04/24/2020 10:45:21 AM By: Army Melia Previous Signature: 04/19/2020 11:19:24 AM Version By: Army Melia Entered By: Gretta Cool BSN, RN, CWS, Kim on 04/20/2020 08:23:47 [name] ([id number]) -------------------------------------------------------------------------------- Nutrition Risk Screening Details Patient Name: [name] Date of Service: 04/18/2020 3:00 PM Medical Record Number: [id number] Patient Account Number: [id number] Date of Birth/Sex: [birth year] (73 y.o. M) Treating RN: Army Melia Primary Care Marl Seago: Ria Bush Other Clinician: Referring Rainen Vanrossum: Ria Bush Treating Aerilynn Goin/Extender: Tito Dine in Treatment: 0 Height (in): 72 Weight (lbs): 219 Body Mass Index (BMI): 29.7 Nutrition Risk Screening Items Score Screening NUTRITION RISK SCREEN: I have an illness or condition that made me change the kind and/or amount of food I eat 0 No I eat fewer than two meals per day 0 No I eat few fruits and vegetables, or milk products 0 No I have three or more drinks of beer, liquor or wine almost every day 0 No I have tooth or mouth problems that make it hard for me to eat 0 No I don't always have  enough money to buy the food I need 0 No I eat alone most of the time 0 No I take three or more different prescribed or over-the-counter drugs a day 0 No Without wanting to, I have lost or gained 10 pounds in the last six months 0 No I am not always physically able to shop, cook and/or feed myself 0 No Nutrition Protocols Good Risk Protocol 0 No interventions needed Moderate Risk Protocol High Risk Proctocol Risk Level: Good Risk Score: 0 Electronic Signature(s) Signed: 04/20/2020 8:23:38 AM By: Gretta Cool, BSN, RN, CWS, Kim RN, BSN Signed: 04/24/2020 10:45:21 AM By: Army Melia Previous Signature: 04/19/2020 11:19:24 AM Version By: Army Melia Entered By:  Gretta Cool, BSN, RN, CWS, Kim on 04/20/2020 08:23:38
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
## 2020-04-20 ENCOUNTER — Other Ambulatory Visit: Payer: Self-pay

## 2020-04-20 DIAGNOSIS — L97111 Non-pressure chronic ulcer of right thigh limited to breakdown of skin: Secondary | ICD-10-CM | POA: Diagnosis not present

## 2020-04-20 DIAGNOSIS — E11622 Type 2 diabetes mellitus with other skin ulcer: Secondary | ICD-10-CM | POA: Diagnosis not present

## 2020-04-20 DIAGNOSIS — E785 Hyperlipidemia, unspecified: Secondary | ICD-10-CM | POA: Diagnosis not present

## 2020-04-20 DIAGNOSIS — L97821 Non-pressure chronic ulcer of other part of left lower leg limited to breakdown of skin: Secondary | ICD-10-CM | POA: Diagnosis not present

## 2020-04-20 DIAGNOSIS — E1142 Type 2 diabetes mellitus with diabetic polyneuropathy: Secondary | ICD-10-CM | POA: Diagnosis not present

## 2020-04-20 DIAGNOSIS — I10 Essential (primary) hypertension: Secondary | ICD-10-CM | POA: Diagnosis not present
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
## 2020-04-23 ENCOUNTER — Ambulatory Visit: Payer: Medicare Other

## 2020-04-23 DIAGNOSIS — T24232D Burn of second degree of left lower leg, subsequent encounter: Secondary | ICD-10-CM | POA: Diagnosis not present

## 2020-04-24 ENCOUNTER — Other Ambulatory Visit: Payer: Self-pay | Admitting: Family Medicine

## 2020-04-24 DIAGNOSIS — G894 Chronic pain syndrome: Secondary | ICD-10-CM

## 2020-04-25 ENCOUNTER — Encounter: Payer: Medicare Other | Admitting: Internal Medicine

## 2020-04-25 ENCOUNTER — Other Ambulatory Visit: Payer: Self-pay

## 2020-04-25 DIAGNOSIS — L97111 Non-pressure chronic ulcer of right thigh limited to breakdown of skin: Secondary | ICD-10-CM | POA: Diagnosis not present

## 2020-04-25 DIAGNOSIS — L97821 Non-pressure chronic ulcer of other part of left lower leg limited to breakdown of skin: Secondary | ICD-10-CM | POA: Diagnosis not present

## 2020-04-25 DIAGNOSIS — T24232A Burn of second degree of left lower leg, initial encounter: Secondary | ICD-10-CM | POA: Diagnosis not present

## 2020-04-25 DIAGNOSIS — E11622 Type 2 diabetes mellitus with other skin ulcer: Secondary | ICD-10-CM | POA: Diagnosis not present

## 2020-04-25 DIAGNOSIS — E785 Hyperlipidemia, unspecified: Secondary | ICD-10-CM | POA: Diagnosis not present

## 2020-04-25 DIAGNOSIS — E1142 Type 2 diabetes mellitus with diabetic polyneuropathy: Secondary | ICD-10-CM | POA: Diagnosis not present

## 2020-04-25 DIAGNOSIS — I10 Essential (primary) hypertension: Secondary | ICD-10-CM | POA: Diagnosis not present

## 2020-04-25 MED ORDER — HYDROCODONE-ACETAMINOPHEN 10-325 MG PO TABS: 1.0000 | ORAL_TABLET | Freq: Four times a day (QID) | ORAL | 0 refills | Status: DC | PRN

## 2020-04-25 NOTE — Telephone Encounter (Signed)
ERx 
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
## 2020-04-26 ENCOUNTER — Telehealth: Payer: Self-pay | Admitting: Cardiovascular Disease
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
## 2020-04-26 NOTE — Telephone Encounter (Signed)
Patient message sent with advice.
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
## 2020-04-27 ENCOUNTER — Ambulatory Visit: Payer: Medicare Other

## 2020-04-27 ENCOUNTER — Other Ambulatory Visit: Payer: Self-pay

## 2020-04-27 DIAGNOSIS — E782 Mixed hyperlipidemia: Secondary | ICD-10-CM

## 2020-04-27 DIAGNOSIS — E1142 Type 2 diabetes mellitus with diabetic polyneuropathy: Secondary | ICD-10-CM | POA: Diagnosis not present

## 2020-04-27 DIAGNOSIS — E785 Hyperlipidemia, unspecified: Secondary | ICD-10-CM | POA: Diagnosis not present

## 2020-04-27 DIAGNOSIS — I10 Essential (primary) hypertension: Secondary | ICD-10-CM | POA: Diagnosis not present

## 2020-04-27 DIAGNOSIS — L97111 Non-pressure chronic ulcer of right thigh limited to breakdown of skin: Secondary | ICD-10-CM | POA: Diagnosis not present

## 2020-04-27 DIAGNOSIS — IMO0002 Reserved for concepts with insufficient information to code with codable children: Secondary | ICD-10-CM

## 2020-04-27 DIAGNOSIS — L97821 Non-pressure chronic ulcer of other part of left lower leg limited to breakdown of skin: Secondary | ICD-10-CM | POA: Diagnosis not present

## 2020-04-27 DIAGNOSIS — E11622 Type 2 diabetes mellitus with other skin ulcer: Secondary | ICD-10-CM | POA: Diagnosis not present
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
## 2020-04-30 ENCOUNTER — Ambulatory Visit (INDEPENDENT_AMBULATORY_CARE_PROVIDER_SITE_OTHER): Payer: Medicare Other | Admitting: Family Medicine

## 2020-04-30 ENCOUNTER — Other Ambulatory Visit: Payer: Self-pay

## 2020-04-30 ENCOUNTER — Encounter: Payer: Self-pay | Admitting: Family Medicine

## 2020-04-30 VITALS — BP 122/64 | HR 55 | Temp 98.0°F | Ht 72.0 in | Wt 228.1 lb

## 2020-04-30 DIAGNOSIS — I4891 Unspecified atrial fibrillation: Secondary | ICD-10-CM

## 2020-04-30 DIAGNOSIS — L97821 Non-pressure chronic ulcer of other part of left lower leg limited to breakdown of skin: Secondary | ICD-10-CM | POA: Diagnosis not present

## 2020-04-30 DIAGNOSIS — T24232A Burn of second degree of left lower leg, initial encounter: Secondary | ICD-10-CM

## 2020-04-30 DIAGNOSIS — Z7689 Persons encountering health services in other specified circumstances: Secondary | ICD-10-CM

## 2020-04-30 DIAGNOSIS — L97511 Non-pressure chronic ulcer of other part of right foot limited to breakdown of skin: Secondary | ICD-10-CM

## 2020-04-30 DIAGNOSIS — E1140 Type 2 diabetes mellitus with diabetic neuropathy, unspecified: Secondary | ICD-10-CM | POA: Diagnosis not present

## 2020-04-30 DIAGNOSIS — E11622 Type 2 diabetes mellitus with other skin ulcer: Secondary | ICD-10-CM | POA: Diagnosis not present

## 2020-04-30 DIAGNOSIS — J449 Chronic obstructive pulmonary disease, unspecified: Secondary | ICD-10-CM

## 2020-04-30 DIAGNOSIS — L97111 Non-pressure chronic ulcer of right thigh limited to breakdown of skin: Secondary | ICD-10-CM | POA: Diagnosis not present

## 2020-04-30 DIAGNOSIS — Z720 Tobacco use: Secondary | ICD-10-CM | POA: Diagnosis not present

## 2020-04-30 DIAGNOSIS — M8949 Other hypertrophic osteoarthropathy, multiple sites: Secondary | ICD-10-CM

## 2020-04-30 DIAGNOSIS — E785 Hyperlipidemia, unspecified: Secondary | ICD-10-CM | POA: Diagnosis not present

## 2020-04-30 DIAGNOSIS — Z7901 Long term (current) use of anticoagulants: Secondary | ICD-10-CM

## 2020-04-30 DIAGNOSIS — Z23 Encounter for immunization: Secondary | ICD-10-CM | POA: Diagnosis not present

## 2020-04-30 DIAGNOSIS — IMO0002 Reserved for concepts with insufficient information to code with codable children: Secondary | ICD-10-CM

## 2020-04-30 DIAGNOSIS — E1142 Type 2 diabetes mellitus with diabetic polyneuropathy: Secondary | ICD-10-CM | POA: Diagnosis not present

## 2020-04-30 DIAGNOSIS — R2689 Other abnormalities of gait and mobility: Secondary | ICD-10-CM | POA: Diagnosis not present

## 2020-04-30 DIAGNOSIS — G894 Chronic pain syndrome: Secondary | ICD-10-CM

## 2020-04-30 DIAGNOSIS — E11621 Type 2 diabetes mellitus with foot ulcer: Secondary | ICD-10-CM | POA: Diagnosis not present

## 2020-04-30 DIAGNOSIS — I10 Essential (primary) hypertension: Secondary | ICD-10-CM | POA: Diagnosis not present

## 2020-04-30 DIAGNOSIS — R0600 Dyspnea, unspecified: Secondary | ICD-10-CM | POA: Diagnosis not present

## 2020-04-30 DIAGNOSIS — S81802D Unspecified open wound, left lower leg, subsequent encounter: Secondary | ICD-10-CM

## 2020-04-30 DIAGNOSIS — R0609 Other forms of dyspnea: Secondary | ICD-10-CM

## 2020-04-30 DIAGNOSIS — M159 Polyosteoarthritis, unspecified: Secondary | ICD-10-CM

## 2020-04-30 DIAGNOSIS — E1165 Type 2 diabetes mellitus with hyperglycemia: Secondary | ICD-10-CM
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
## 2020-04-30 NOTE — Patient Instructions (Addendum)
Tetanus shot today.  Call to schedule coumadin check.  Cut down on smoking to 1/2 ppd. Especially with summer months and heat coming on.  I will work on power mobility device application and send everything in.  I would like to check CT of head for worsening unsteadiness. We will call you to set this up.
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
## 2020-05-01 DIAGNOSIS — Z7689 Persons encountering health services in other specified circumstances: Secondary | ICD-10-CM | POA: Insufficient documentation

## 2020-05-01 DIAGNOSIS — R2681 Unsteadiness on feet: Secondary | ICD-10-CM | POA: Insufficient documentation

## 2020-05-01 NOTE — Assessment & Plan Note (Signed)
Due for coumadin check - he will call to schedule appointment when he gets home today.

## 2020-05-01 NOTE — Assessment & Plan Note (Signed)
Doesn't think he can quit smoking. Encouraged backing down - he is willing to decrease to 1/2 ppd.

## 2020-05-01 NOTE — Assessment & Plan Note (Signed)
On coumadin.  Not currently taking amiodarone

## 2020-05-01 NOTE — Assessment & Plan Note (Signed)
This affects mobility and ambulation

## 2020-05-01 NOTE — Addendum Note (Signed)
Addended by: Ria Bush on: 05/01/2020 08:18 AM   Modules accepted: Orders

## 2020-05-01 NOTE — Assessment & Plan Note (Addendum)
PMD forms filled out for patient for power wheelchair.

## 2020-05-01 NOTE — Assessment & Plan Note (Addendum)
Notes worsening imbalance over weeks to months with multiple falls, some with injury, tends to fall to the right. Given known risk factors will check head CT to eval for new stroke.

## 2020-05-01 NOTE — Assessment & Plan Note (Signed)
Chronic pain from osteoarthritis affects mobility.

## 2020-05-01 NOTE — Assessment & Plan Note (Addendum)
Appreciate wound clinic care. This seems to be progressing.  Update Td today

## 2020-05-01 NOTE — Assessment & Plan Note (Signed)
Neuropathy affects balance.

## 2020-05-01 NOTE — Assessment & Plan Note (Signed)
Affects ability to use manual wheelchair

## 2020-05-01 NOTE — Assessment & Plan Note (Signed)
Worse in summer months. Encouraged cutting back on smoking. This affects mobility outside of the home

## 2020-05-02 ENCOUNTER — Encounter: Payer: Self-pay | Admitting: Family Medicine

## 2020-05-02 ENCOUNTER — Encounter: Payer: Medicare Other | Admitting: Internal Medicine

## 2020-05-02 ENCOUNTER — Ambulatory Visit (INDEPENDENT_AMBULATORY_CARE_PROVIDER_SITE_OTHER): Payer: Medicare Other

## 2020-05-02 ENCOUNTER — Other Ambulatory Visit: Payer: Self-pay

## 2020-05-02 ENCOUNTER — Ambulatory Visit (INDEPENDENT_AMBULATORY_CARE_PROVIDER_SITE_OTHER): Payer: Medicare Other | Admitting: Family Medicine

## 2020-05-02 VITALS — BP 126/64 | HR 53 | Temp 97.9°F | Ht 70.75 in | Wt 223.0 lb

## 2020-05-02 DIAGNOSIS — T24232A Burn of second degree of left lower leg, initial encounter: Secondary | ICD-10-CM | POA: Diagnosis not present

## 2020-05-02 DIAGNOSIS — Z720 Tobacco use: Secondary | ICD-10-CM

## 2020-05-02 DIAGNOSIS — T24232D Burn of second degree of left lower leg, subsequent encounter: Secondary | ICD-10-CM

## 2020-05-02 DIAGNOSIS — L97821 Non-pressure chronic ulcer of other part of left lower leg limited to breakdown of skin: Secondary | ICD-10-CM | POA: Diagnosis not present

## 2020-05-02 DIAGNOSIS — I4891 Unspecified atrial fibrillation: Secondary | ICD-10-CM

## 2020-05-02 DIAGNOSIS — D509 Iron deficiency anemia, unspecified: Secondary | ICD-10-CM

## 2020-05-02 DIAGNOSIS — E538 Deficiency of other specified B group vitamins: Secondary | ICD-10-CM | POA: Diagnosis not present

## 2020-05-02 DIAGNOSIS — E1142 Type 2 diabetes mellitus with diabetic polyneuropathy: Secondary | ICD-10-CM

## 2020-05-02 DIAGNOSIS — R634 Abnormal weight loss: Secondary | ICD-10-CM

## 2020-05-02 DIAGNOSIS — G894 Chronic pain syndrome: Secondary | ICD-10-CM | POA: Diagnosis not present

## 2020-05-02 DIAGNOSIS — E669 Obesity, unspecified: Secondary | ICD-10-CM

## 2020-05-02 DIAGNOSIS — E1165 Type 2 diabetes mellitus with hyperglycemia: Secondary | ICD-10-CM

## 2020-05-02 DIAGNOSIS — F1120 Opioid dependence, uncomplicated: Secondary | ICD-10-CM | POA: Diagnosis not present

## 2020-05-02 DIAGNOSIS — IMO0002 Reserved for concepts with insufficient information to code with codable children: Secondary | ICD-10-CM

## 2020-05-02 DIAGNOSIS — Z7901 Long term (current) use of anticoagulants: Secondary | ICD-10-CM

## 2020-05-02 DIAGNOSIS — I716 Thoracoabdominal aortic aneurysm, without rupture, unspecified: Secondary | ICD-10-CM

## 2020-05-02 DIAGNOSIS — Z5181 Encounter for therapeutic drug level monitoring: Secondary | ICD-10-CM | POA: Diagnosis not present

## 2020-05-02 DIAGNOSIS — R2689 Other abnormalities of gait and mobility: Secondary | ICD-10-CM

## 2020-05-02 DIAGNOSIS — I10 Essential (primary) hypertension: Secondary | ICD-10-CM | POA: Diagnosis not present

## 2020-05-02 DIAGNOSIS — E11622 Type 2 diabetes mellitus with other skin ulcer: Secondary | ICD-10-CM | POA: Diagnosis not present

## 2020-05-02 DIAGNOSIS — Z7189 Other specified counseling: Secondary | ICD-10-CM | POA: Diagnosis not present

## 2020-05-02 DIAGNOSIS — C61 Malignant neoplasm of prostate: Secondary | ICD-10-CM

## 2020-05-02 DIAGNOSIS — Z0000 Encounter for general adult medical examination without abnormal findings: Secondary | ICD-10-CM | POA: Insufficient documentation

## 2020-05-02 DIAGNOSIS — E559 Vitamin D deficiency, unspecified: Secondary | ICD-10-CM

## 2020-05-02 DIAGNOSIS — L97111 Non-pressure chronic ulcer of right thigh limited to breakdown of skin: Secondary | ICD-10-CM | POA: Diagnosis not present

## 2020-05-02 DIAGNOSIS — I714 Abdominal aortic aneurysm, without rupture, unspecified: Secondary | ICD-10-CM

## 2020-05-02 DIAGNOSIS — Z7689 Persons encountering health services in other specified circumstances: Secondary | ICD-10-CM

## 2020-05-02 DIAGNOSIS — E782 Mixed hyperlipidemia: Secondary | ICD-10-CM

## 2020-05-02 DIAGNOSIS — E785 Hyperlipidemia, unspecified: Secondary | ICD-10-CM | POA: Diagnosis not present

## 2020-05-02 DIAGNOSIS — I700 Atherosclerosis of aorta: Secondary | ICD-10-CM

## 2020-05-02 DIAGNOSIS — T17308A Unspecified foreign body in larynx causing other injury, initial encounter: Secondary | ICD-10-CM

## 2020-05-02 DIAGNOSIS — E1140 Type 2 diabetes mellitus with diabetic neuropathy, unspecified: Secondary | ICD-10-CM

## 2020-05-02 DIAGNOSIS — G8929 Other chronic pain: Secondary | ICD-10-CM

## 2020-05-02 LAB — POCT INR: INR: 1.8 — AB (ref 2.0–3.0)

## 2020-05-02 MED ORDER — VARENICLINE TARTRATE 1 MG PO TABS
1.0000 mg | ORAL_TABLET | Freq: Two times a day (BID) | ORAL | 1 refills | Status: DC
Start: 2020-05-02 — End: 2020-09-11

## 2020-05-02 MED ORDER — CHANTIX STARTING MONTH PAK 0.5 MG X 11 & 1 MG X 42 PO TABS: ORAL_TABLET | ORAL | 0 refills | Status: DC

## 2020-05-02 MED ORDER — VITAMIN B 12 500 MCG PO TABS: 500.0000 ug | ORAL_TABLET | Freq: Every day | ORAL | Status: DC

## 2020-05-02 MED ORDER — VITAMIN D 50 MCG (2000 UT) PO CAPS: 1.0000 | ORAL_CAPSULE | Freq: Every day | ORAL | Status: AC

## 2020-05-02 NOTE — Patient Instructions (Addendum)
Vit D levels are now normal - finish weekly dose then start 2000 units daily.  Decrease vitamin B12 to 590mcg daily.  You are doing well today, labs were looking ok. Return as needed or in 3 months for chronic pain visit.   Health Maintenance After Age 76 After age 62, you are at a higher risk for certain long-term diseases and infections as well as injuries from falls. Falls are a major cause of broken bones and head injuries in people who are older than age 57. Getting regular preventive care can help to keep you healthy and well. Preventive care includes getting regular testing and making lifestyle changes as recommended by your health care provider. Talk with your health care provider about:  Which screenings and tests you should have. A screening is a test that checks for a disease when you have no symptoms.  A diet and exercise plan that is right for you. What should I know about screenings and tests to prevent falls? Screening and testing are the best ways to find a health problem early. Early diagnosis and treatment give you the best chance of managing medical conditions that are common after age 29. Certain conditions and lifestyle choices may make you more likely to have a fall. Your health care provider may recommend:  Regular vision checks. Poor vision and conditions such as cataracts can make you more likely to have a fall. If you wear glasses, make sure to get your prescription updated if your vision changes.  Medicine review. Work with your health care provider to regularly review all of the medicines you are taking, including over-the-counter medicines. Ask your health care provider about any side effects that may make you more likely to have a fall. Tell your health care provider if any medicines that you take make you feel dizzy or sleepy.  Osteoporosis screening. Osteoporosis is a condition that causes the bones to get weaker. This can make the bones weak and cause them to break  more easily.  Blood pressure screening. Blood pressure changes and medicines to control blood pressure can make you feel dizzy.  Strength and balance checks. Your health care provider may recommend certain tests to check your strength and balance while standing, walking, or changing positions.  Foot health exam. Foot pain and numbness, as well as not wearing proper footwear, can make you more likely to have a fall.  Depression screening. You may be more likely to have a fall if you have a fear of falling, feel emotionally low, or feel unable to do activities that you used to do.  Alcohol use screening. Using too much alcohol can affect your balance and may make you more likely to have a fall. What actions can I take to lower my risk of falls? General instructions  Talk with your health care provider about your risks for falling. Tell your health care provider if: ? You fall. Be sure to tell your health care provider about all falls, even ones that seem minor. ? You feel dizzy, sleepy, or off-balance.  Take over-the-counter and prescription medicines only as told by your health care provider. These include any supplements.  Eat a healthy diet and maintain a healthy weight. A healthy diet includes low-fat dairy products, low-fat (lean) meats, and fiber from whole grains, beans, and lots of fruits and vegetables. Home safety  Remove any tripping hazards, such as rugs, cords, and clutter.  Install safety equipment such as grab bars in bathrooms and safety rails on  stairs.  Keep rooms and walkways well-lit. Activity   Follow a regular exercise program to stay fit. This will help you maintain your balance. Ask your health care provider what types of exercise are appropriate for you.  If you need a cane or walker, use it as recommended by your health care provider.  Wear supportive shoes that have nonskid soles. Lifestyle  Do not drink alcohol if your health care provider tells you not  to drink.  If you drink alcohol, limit how much you have: ? 0-1 drink a day for women. ? 0-2 drinks a day for men.  Be aware of how much alcohol is in your drink. In the [place] one drink equals one typical bottle of beer (12 oz), one-half glass of wine (5 oz), or one shot of hard liquor (1 oz).  Do not use any products that contain nicotine or tobacco, such as cigarettes and e-cigarettes. If you need help quitting, ask your health care provider. Summary  Having a healthy lifestyle and getting preventive care can help to protect your health and wellness after age 65.  Screening and testing are the best way to find a health problem early and help you avoid having a fall. Early diagnosis and treatment give you the best chance for managing medical conditions that are more common for people who are older than age 9.  Falls are a major cause of broken bones and head injuries in people who are older than age 82. Take precautions to prevent a fall at home.  Work with your health care provider to learn what changes you can make to improve your health and wellness and to prevent falls. This information is not intended to replace advice given to you by your health care provider. Make sure you discuss any questions you have with your health care provider. Document Revised: 03/17/2019 Document Reviewed: 10/07/2017 Elsevier Patient Education  2020 [employer].
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
## 2020-05-02 NOTE — Patient Instructions (Signed)
-   take 1 whole tablet today & tomorrow, then  - resume warfarin dosage of warfarin 1/2 tablet every day. - recheck INR in 4 weeks

## 2020-05-02 NOTE — Assessment & Plan Note (Signed)

## 2020-05-03 NOTE — Progress Notes (Signed)
[name] ([id number]) Visit Report for 05/02/2020 Arrival Information Details Patient Name: [name] Date of Service: 05/02/2020 9:30 AM Medical Record Number: [id number] Patient Account Number: [id number] Date of Birth/Sex: February 03, 1946 (74 y.o. M) Treating RN: Army Melia Primary Care Leshea Jaggers: Ria Bush Other Clinician: Referring Krysia Zahradnik: Ria Bush Treating Evaluna Utke/Extender: Beverly Gust in Treatment: 2 Visit Information History Since Last Visit Added or deleted any medications: No Patient Arrived: Wheel Chair Any new allergies or adverse reactions: No Arrival Time: 09:37 Had a fall or experienced change in No Accompanied By: self activities of daily living that may affect Transfer Assistance: None risk of falls: Patient Identification Verified: Yes Signs or symptoms of abuse/neglect since last visito No Hospitalized since last visit: No Has Dressing in Place as Prescribed: Yes Pain Present Now: Yes Electronic Signature(s) Signed: 05/02/2020 11:17:30 AM By: Army Melia Entered By: Army Melia on 05/02/2020 09:49:18 [name] ([id number]) -------------------------------------------------------------------------------- Compression Therapy Details Patient Name: [name] Date of Service: 05/02/2020 9:30 AM Medical Record Number: [id number] Patient Account Number: [id number] Date of Birth/Sex: [birth year] (74 y.o. M) Treating RN: Cornell Barman Primary Care Romesha Scherer: Ria Bush Other Clinician: Referring Allante Beane: Ria Bush Treating Sibel Khurana/Extender: Beverly Gust in Treatment: 2 Compression Therapy Performed for Wound Assessment: Wound #4 Left,Distal Lower Leg Performed By: Clinician Montey Hora, RN Compression Type: Three Layer Post Procedure Diagnosis Same as Pre-procedure Electronic Signature(s) Signed: 05/02/2020 5:27:22 PM By: Gretta Cool, BSN, RN, CWS, Kim RN, BSN Entered By: Gretta Cool, BSN, RN, CWS, Kim on  05/02/2020 10:02:11 [name] ([id number]) -------------------------------------------------------------------------------- Encounter Discharge Information Details Patient Name: [name] Date of Service: 05/02/2020 9:30 AM Medical Record Number: [id number] Patient Account Number: [id number] Date of Birth/Sex: 06-05-1946 (74 y.o. M) Treating RN: Cornell Barman Primary Care Cindel Daugherty: Ria Bush Other Clinician: Referring Charde Macfarlane: Ria Bush Treating Shamir Tuzzolino/Extender: Beverly Gust in Treatment: 2 Encounter Discharge Information Items Discharge Condition: Stable Ambulatory Status: Wheelchair Discharge Destination: Home Transportation: Private Auto Accompanied By: wife Schedule Follow-up Appointment: Yes Clinical Summary of Care: Electronic Signature(s) Signed: 05/02/2020 5:27:22 PM By: Gretta Cool, BSN, RN, CWS, Kim RN, BSN Entered By: Gretta Cool, BSN, RN, CWS, Kim on 05/02/2020 10:04:29 [name] ([id number]) -------------------------------------------------------------------------------- Lower Extremity Assessment Details Patient Name: [name] Date of Service: 05/02/2020 9:30 AM Medical Record Number: [id number] Patient Account Number: [id number] Date of Birth/Sex: 06/18/1946 (74 y.o. M) Treating RN: Army Melia Primary Care Dashawna Delbridge: Ria Bush Other Clinician: Referring Danaysha Kirn: Ria Bush Treating Zekiel Torian/Extender: Beverly Gust in Treatment: 2 Edema Assessment Assessed: [Left: No] [Right: No] Edema: [Left: N] [Right: o] Calf Left: Right: Point of Measurement: 34 cm From Medial Instep 34 cm cm Ankle Left: Right: Point of Measurement: 10 cm From Medial Instep 25 cm cm Vascular Assessment Pulses: Dorsalis Pedis Palpable: [Left:Yes] Electronic Signature(s) Signed: 05/02/2020 11:17:30 AM By: Army Melia Entered By: Army Melia on 05/02/2020 09:57:41 [name]  ([id number]) -------------------------------------------------------------------------------- Multi Wound Chart Details Patient Name: [name] Date of Service: 05/02/2020 9:30 AM Medical Record Number: [id number] Patient Account Number: [id number] Date of Birth/Sex: 03/23/1946 (74 y.o. M) Treating RN: Cornell Barman Primary Care Nona Gracey: Ria Bush Other Clinician: Referring Naturi Alarid: Ria Bush Treating Lashawnna Lambrecht/Extender: Beverly Gust in Treatment: 2 Vital Signs Height(in): 72 Pulse(bpm): 50 Weight(lbs): 219 Blood Pressure(mmHg): 109/56 Body Mass Index(BMI): 30 Temperature(F): 98.0 Respiratory Rate(breaths/min): 16 Photos: [N/A:N/A] Wound Location: Left, Proximal Lower Leg Left, Distal Lower Leg N/A Wounding Event: Thermal Burn Thermal Burn N/A Primary Etiology: 2nd degree Burn 2nd degree  Burn N/A Comorbid History: Sleep Apnea, Arrhythmia, Sleep Apnea, Arrhythmia, N/A Hypertension, Type II Diabetes, Hypertension, Type II Diabetes, Osteoarthritis, Neuropathy, Osteoarthritis, Neuropathy, Received Radiation Received Radiation Date Acquired: 04/12/2020 04/12/2020 N/A Weeks of Treatment: 2 2 N/A Wound Status: Open Open N/A Measurements L x W x D (cm) 0x0x0 12.8x6x0.1 N/A Area (cm) : 0 60.319 N/A Volume (cm) : 0 6.032 N/A % Reduction in Area: 100.00% 35.60% N/A % Reduction in Volume: 100.00% 35.60% N/A Classification: Full Thickness Without Exposed Full Thickness Without Exposed N/A Support Structures Support Structures Exudate Amount: Medium Medium N/A Exudate Type: Serosanguineous Serosanguineous N/A Exudate Color: red, brown red, brown N/A Wound Margin: N/A Flat and Intact N/A Granulation Amount: None Present (0%) Medium (34-66%) N/A Granulation Quality: N/A Red N/A Necrotic Amount: None Present (0%) Medium (34-66%) N/A Exposed Structures: Fascia: No Fat Layer (Subcutaneous Tissue) N/A Fat Layer (Subcutaneous Tissue) Exposed: Yes Exposed:  No Fascia: No Tendon: No Tendon: No Muscle: No Muscle: No Joint: No Joint: No Bone: No Bone: No Epithelialization: Large (67-100%) Small (1-33%) N/A Treatment Notes Electronic Signature(s) Signed: 05/02/2020 5:27:22 PM By: Gretta Cool, BSN, RN, CWS, Kim RN, BSN Entered By: Gretta Cool, BSN, RN, CWS, Kim on 05/02/2020 10:01:47 [name] ([id number]) -------------------------------------------------------------------------------- [place] Details Patient Name: [name] Date of Service: 05/02/2020 9:30 AM Medical Record Number: [id number] Patient Account Number: [id number] Date of Birth/Sex: 1946-11-19 (74 y.o. M) Treating RN: Cornell Barman Primary Care Beuna Bolding: Ria Bush Other Clinician: Referring Contina Strain: Ria Bush Treating Kassia Demarinis/Extender: Beverly Gust in Treatment: 2 Active Inactive Necrotic Tissue Nursing Diagnoses: Impaired tissue integrity related to necrotic/devitalized tissue Goals: Necrotic/devitalized tissue will be minimized in the wound bed Date Initiated: 04/18/2020 Target Resolution Date: 05/02/2020 Goal Status: Active Interventions: Assess patient pain level pre-, during and post procedure and prior to discharge Notes: Orientation to the Wound Care Program Nursing Diagnoses: Knowledge deficit related to the wound healing center program Goals: Patient/caregiver will verbalize understanding of the [place] Date Initiated: 04/18/2020 Target Resolution Date: 05/02/2020 Goal Status: Active Interventions: Provide education on orientation to the wound center Notes: Pain, Acute or Chronic Nursing Diagnoses: Pain, acute or chronic: actual or potential Potential alteration in comfort, pain Goals: Patient/caregiver will verbalize adequate pain control between visits Date Initiated: 04/18/2020 Target Resolution Date: 05/02/2020 Goal Status: Active Interventions: Assess comfort goal upon  admission Treatment Activities: Refer to pain specialist or other pain support program : 04/18/2020 Notes: Wound/Skin Impairment Nursing Diagnoses: Impaired tissue integrity Goals: [name] ([id number]) Ulcer/skin breakdown will have a volume reduction of 30% by week 4 Date Initiated: 04/18/2020 Target Resolution Date: 05/19/2020 Goal Status: Active Interventions: Assess ulceration(s) every visit Treatment Activities: Skin care regimen initiated : 04/18/2020 Topical wound management initiated : 04/18/2020 Notes: Electronic Signature(s) Signed: 05/02/2020 5:27:22 PM By: Gretta Cool, BSN, RN, CWS, Kim RN, BSN Entered By: Gretta Cool, BSN, RN, CWS, Kim on 05/02/2020 10:01:39 [name] ([id number]) -------------------------------------------------------------------------------- Pain Assessment Details Patient Name: [name] Date of Service: 05/02/2020 9:30 AM Medical Record Number: [id number] Patient Account Number: [id number] Date of Birth/Sex: [birth year] (74 y.o. M) Treating RN: Army Melia Primary Care Akhilesh Sassone: Ria Bush Other Clinician: Referring Hadlea Furuya: Ria Bush Treating Male Minish/Extender: Beverly Gust in Treatment: 2 Active Problems Location of Pain Severity and Description of Pain Patient Has Paino No Site Locations Pain Management and Medication Current Pain Management: Electronic Signature(s) Signed: 05/02/2020 11:17:30 AM By: Army Melia Entered By: Army Melia on 05/02/2020 09:53:14 [name] ([id number]) -------------------------------------------------------------------------------- Patient/Caregiver Education Details  Patient Name: [name], [name] Date of Service: 05/02/2020 9:30 AM Medical Record Number: [id number] Patient Account Number: [id number] Date of Birth/Gender: 1946-03-23 (74 y.o. M) Treating RN: Cornell Barman Primary Care Physician: Ria Bush Other Clinician: Referring Physician: Ria Bush Treating  Physician/Extender: Beverly Gust in Treatment: 2 Education Assessment Education Provided To: Patient Education Topics Provided Wound/Skin Impairment: Handouts: Caring for Your Ulcer Methods: Demonstration, Explain/Verbal Responses: State content correctly Electronic Signature(s) Signed: 05/02/2020 5:27:22 PM By: Gretta Cool, BSN, RN, CWS, Kim RN, BSN Entered By: Gretta Cool, BSN, RN, CWS, Kim on 05/02/2020 10:03:50 [name] ([id number]) -------------------------------------------------------------------------------- Wound Assessment Details Patient Name: [name] Date of Service: 05/02/2020 9:30 AM Medical Record Number: [id number] Patient Account Number: [id number] Date of Birth/Sex: 1946/12/03 (74 y.o. M) Treating RN: Army Melia Primary Care Paisli Silfies: Ria Bush Other Clinician: Referring Milayah Krell: Ria Bush Treating Jessina Marse/Extender: Beverly Gust in Treatment: 2 Wound Status Wound Number: 3 Primary 2nd degree Burn Etiology: Wound Location: Left, Proximal Lower Leg Wound Open Wounding Event: Thermal Burn Status: Date Acquired: 04/12/2020 Comorbid Sleep Apnea, Arrhythmia, Hypertension, Type II Weeks Of Treatment: 2 History: Diabetes, Osteoarthritis, Neuropathy, Received Radiation Clustered Wound: No Photos Wound Measurements Length: (cm) 0 Width: (cm) 0 Depth: (cm) 0 Area: (cm) Volume: (cm) % Reduction in Area: 100% % Reduction in Volume: 100% Epithelialization: Large (67-100%) 0 Tunneling: No 0 Undermining: No Wound Description Classification: Full Thickness Without Exposed Support Structu Exudate Amount: Medium Exudate Type: Serosanguineous Exudate Color: red, brown res Foul Odor After Cleansing: No Slough/Fibrino No Wound Bed Granulation Amount: None Present (0%) Exposed Structure Necrotic Amount: None Present (0%) Fascia Exposed: No Fat Layer (Subcutaneous Tissue) Exposed: No Tendon Exposed: No Muscle Exposed:  No Joint Exposed: No Bone Exposed: No Electronic Signature(s) Signed: 05/02/2020 11:17:30 AM By: Army Melia Entered By: Army Melia on 05/02/2020 09:56:31 [name] ([id number]) -------------------------------------------------------------------------------- Wound Assessment Details Patient Name: [name] Date of Service: 05/02/2020 9:30 AM Medical Record Number: [id number] Patient Account Number: [id number] Date of Birth/Sex: [birth year] (74 y.o. M) Treating RN: Army Melia Primary Care Daaiyah Baumert: Ria Bush Other Clinician: Referring Chyrel Taha: Ria Bush Treating Fannie Gathright/Extender: Beverly Gust in Treatment: 2 Wound Status Wound Number: 4 Primary 2nd degree Burn Etiology: Wound Location: Left, Distal Lower Leg Wound Open Wounding Event: Thermal Burn Status: Date Acquired: 04/12/2020 Comorbid Sleep Apnea, Arrhythmia, Hypertension, Type II Weeks Of Treatment: 2 History: Diabetes, Osteoarthritis, Neuropathy, Received Radiation Clustered Wound: No Photos Wound Measurements Length: (cm) 12.8 Width: (cm) 6 Depth: (cm) 0.1 Area: (cm) 60.319 Volume: (cm) 6.032 % Reduction in Area: 35.6% % Reduction in Volume: 35.6% Epithelialization: Small (1-33%) Tunneling: No Undermining: No Wound Description Classification: Full Thickness Without Exposed Support Structu Wound Margin: Flat and Intact Exudate Amount: Medium Exudate Type: Serosanguineous Exudate Color: red, brown res Foul Odor After Cleansing: No Slough/Fibrino Yes Wound Bed Granulation Amount: Medium (34-66%) Exposed Structure Granulation Quality: Red Fascia Exposed: No Necrotic Amount: Medium (34-66%) Fat Layer (Subcutaneous Tissue) Exposed: Yes Necrotic Quality: Adherent Slough Tendon Exposed: No Muscle Exposed: No Joint Exposed: No Bone Exposed: No Treatment Notes Wound #4 (Left, Distal Lower Leg) Notes adaptic, h.blue, ABD, 3 layer Electronic Signature(s) Signed:  05/02/2020 11:17:30 AM By: Anselm Lis ([id number]) Entered By: Army Melia on 05/02/2020 09:55:54 [name] ([id number]) -------------------------------------------------------------------------------- Vitals Details Patient Name: [name] Date of Service: 05/02/2020 9:30 AM Medical Record Number: [id number] Patient Account Number: [id number] Date of Birth/Sex: 03-11-1946 (74 y.o. M) Treating RN: Army Melia Primary Care Keith Felten: Danise Mina  JAVIER Other Clinician: Referring Ricci Paff: Ria Bush Treating Fardowsa Authier/Extender: Beverly Gust in Treatment: 2 Vital Signs Time Taken: 09:49 Temperature (F): 98.0 Height (in): 72 Pulse (bpm): 50 Weight (lbs): 219 Respiratory Rate (breaths/min): 16 Body Mass Index (BMI): 29.7 Blood Pressure (mmHg): 109/56 Reference Range: 80 - 120 mg / dl Electronic Signature(s) Signed: 05/02/2020 11:17:30 AM By: Army Melia Entered By: Army Melia on 05/02/2020 09:53:09
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
## 2020-05-04 ENCOUNTER — Telehealth: Payer: Self-pay

## 2020-05-04 ENCOUNTER — Other Ambulatory Visit: Payer: Self-pay

## 2020-05-04 DIAGNOSIS — L97111 Non-pressure chronic ulcer of right thigh limited to breakdown of skin: Secondary | ICD-10-CM | POA: Diagnosis not present

## 2020-05-04 DIAGNOSIS — E1142 Type 2 diabetes mellitus with diabetic polyneuropathy: Secondary | ICD-10-CM | POA: Diagnosis not present

## 2020-05-04 DIAGNOSIS — L97821 Non-pressure chronic ulcer of other part of left lower leg limited to breakdown of skin: Secondary | ICD-10-CM | POA: Diagnosis not present

## 2020-05-04 DIAGNOSIS — I10 Essential (primary) hypertension: Secondary | ICD-10-CM | POA: Diagnosis not present

## 2020-05-04 DIAGNOSIS — E785 Hyperlipidemia, unspecified: Secondary | ICD-10-CM | POA: Diagnosis not present

## 2020-05-04 DIAGNOSIS — E11622 Type 2 diabetes mellitus with other skin ulcer: Secondary | ICD-10-CM | POA: Diagnosis not present

## 2020-05-04 NOTE — Telephone Encounter (Signed)
Consult with PCP regarding CCM visit 04/27/20:  Patient agreeable to adding on additional therapy for DM due to A1c at 8% and BG readings elevated (see CCM note). Pt is only able to tolerate metformin 1000 mg daily due to GI upset. He would like low-cost medication as not eligible for assistance programs. Would you consider adding low dose glipizide?  Thanks!  [name], PharmD Clinical Pharmacist [facility] at [facility] [phone]

## 2020-05-04 NOTE — Patient Instructions (Addendum)
Dear [name],  Below is a summary of the goals we discussed during our follow up appointment on Apr 27, 2020. Please contact me anytime with questions or concerns.   Visit Information  Goals Addressed            This Visit's Progress   . Pharmacy Care Plan: Diabetes       CARE PLAN ENTRY  Current Barriers:  . Chronic Disease Management support, education, and care coordination needs related to Hyperlipidemia and Diabetes   Hyperlipidemia . Pharmacist Clinical Goal(s): o Over the next 30 days, patient will work with PharmD and providers to maintain LDL goal < 100 - LDL goal achieved 04/16/20 . Current regimen:   Pravastatin 20 mg - 1 tablet daily  Fish oil 1000 mg - 1 capsule daily  . Interventions: o Recommend annual lipid panel - completed, exercise as able, low cholesterol diet  . Patient self care activities - Over the next 30 days, patient will: o Review cholesterol content in foods handout and improve dietary choices  Diabetes . Pharmacist Clinical Goal(s): o Over the next 30 days, patient will work with PharmD and providers to achieve A1c goal < 7% . Current regimen:  o Metformin 500 mg ER - 2 tablets once daily . Interventions: o Consult with Dr. Danise Mina for additional medication therapy . Patient self care activities - Over the next 30 days, patient will: o Check check blood sugar daily before breakfast, document, and provide at future appointments o Contact provider with any episodes of hypoglycemia o Continue metformin 500 mg ER - 2 tablets every morning until further notice   Please see past updates related to this goal by clicking on the "Past Updates" button in the selected goal       The patient verbalized understanding of instructions provided today and agreed to receive a mailed copy of patient instruction and/or educational materials.  Telephone follow up appointment with pharmacy team member scheduled for: 06/05/20 at 3:00 PM  (telephone)  [name], PharmD Clinical Pharmacist [facility] at [facility] [phone]   Cholesterol Content in Foods Cholesterol is a waxy, fat-like substance that helps to carry fat in the blood. The body needs cholesterol in small amounts, but too much cholesterol can cause damage to the arteries and heart. Most people should eat less than 200 milligrams (mg) of cholesterol a day. Foods with cholesterol  Cholesterol is found in animal-based foods, such as meat, seafood, and dairy. Generally, low-fat dairy and lean meats have less cholesterol than full-fat dairy and fatty meats. The milligrams of cholesterol per serving (mg per serving) of common cholesterol-containing foods are listed below. Meat and other proteins  Egg -- one large whole egg has 186 mg.  Veal shank -- 4 oz has 141 mg.  Lean ground [place] (93% lean) -- 4 oz has 118 mg.  Fat-trimmed lamb loin -- 4 oz has 106 mg.  Lean ground beef (90% lean) -- 4 oz has 100 mg.  Lobster -- 3.5 oz has 90 mg.  Pork loin chops -- 4 oz has 86 mg.  Canned salmon -- 3.5 oz has 83 mg.  Fat-trimmed beef top loin -- 4 oz has 78 mg.  Frankfurter -- 1 frank (3.5 oz) has 77 mg.  Crab -- 3.5 oz has 71 mg.  Roasted chicken without skin, white meat -- 4 oz has 66 mg.  Light bologna -- 2 oz has 45 mg.  Deli-cut [place] -- 2 oz has 31 mg.  Canned tuna -- 3.5 oz has 31 mg.  [name] -- 1 oz has 29 mg.  Oysters and mussels (raw) -- 3.5 oz has 25 mg.  Mackerel -- 1 oz has 22 mg.  Trout -- 1 oz has 20 mg.  Pork sausage -- 1 link (1 oz) has 17 mg.  Salmon -- 1 oz has 16 mg.  Tilapia -- 1 oz has 14 mg. Dairy  Soft-serve ice cream --  cup (4 oz) has 103 mg.  Whole-milk yogurt -- 1 cup (8 oz) has 29 mg.  Cheddar cheese -- 1 oz has 28 mg.  American cheese -- 1 oz has 28 mg.  Whole milk -- 1 cup (8 oz) has 23 mg.  2% milk -- 1 cup (8 oz) has 18 mg.  Cream cheese -- 1 tablespoon (Tbsp) has 15  mg.  Cottage cheese --  cup (4 oz) has 14 mg.  Low-fat (1%) milk -- 1 cup (8 oz) has 10 mg.  Sour cream -- 1 Tbsp has 8.5 mg.  Low-fat yogurt -- 1 cup (8 oz) has 8 mg.  Nonfat Greek yogurt -- 1 cup (8 oz) has 7 mg.  Half-and-half cream -- 1 Tbsp has 5 mg. Fats and oils  Cod liver oil -- 1 tablespoon (Tbsp) has 82 mg.  Butter -- 1 Tbsp has 15 mg.  Lard -- 1 Tbsp has 14 mg.  Bacon grease -- 1 Tbsp has 14 mg.  Mayonnaise -- 1 Tbsp has 5-10 mg.  Margarine -- 1 Tbsp has 3-10 mg. Exact amounts of cholesterol in these foods may vary depending on specific ingredients and brands. Foods without cholesterol Most plant-based foods do not have cholesterol unless you combine them with a food that has cholesterol. Foods without cholesterol include:  Grains and cereals.  Vegetables.  Fruits.  Vegetable oils, such as olive, canola, and sunflower oil.  Legumes, such as peas, beans, and lentils.  Nuts and seeds.  Egg whites. Summary  The body needs cholesterol in small amounts, but too much cholesterol can cause damage to the arteries and heart.  Most people should eat less than 200 milligrams (mg) of cholesterol a day. This information is not intended to replace advice given to you by your health care provider. Make sure you discuss any questions you have with your health care provider. Document Revised: 11/06/2017 Document Reviewed: 07/21/2017 Elsevier Patient Education  [place].

## 2020-05-04 NOTE — Progress Notes (Signed)
[name], [name] ([id number]) Visit Report for 05/04/2020 Arrival Information Details Patient Name: [name], [name] Date of Service: 05/04/2020 11:00 AM Medical Record Number: [id number] Patient Account Number: [id number] Date of Birth/Sex: 07/03/1946 (74 y.o. M) Treating RN: Cornell Barman Primary Care Anaalicia Reimann: Ria Bush Other Clinician: Referring Keeyon Privitera: Ria Bush Treating [place] Grayer/Extender: Melburn Hake, HOYT Weeks in Treatment: 2 Visit Information History Since Last Visit Added or deleted any medications: No Patient Arrived: Ambulatory Has Dressing in Place as Prescribed: Yes Arrival Time: 11:39 Pain Present Now: Yes Accompanied By: wife Transfer Assistance: None Patient Identification Verified: Yes Secondary Verification Process Completed: Yes Electronic Signature(s) Signed: 05/04/2020 2:01:12 PM By: Gretta Cool, BSN, RN, CWS, Kim RN, BSN Entered By: Gretta Cool, BSN, RN, CWS, Kim on 05/04/2020 11:45:00 [name] ([id number]) -------------------------------------------------------------------------------- Compression Therapy Details Patient Name: [name] Date of Service: 05/04/2020 11:00 AM Medical Record Number: [id number] Patient Account Number: [id number] Date of Birth/Sex: November 28, 1946 (74 y.o. M) Treating RN: Cornell Barman Primary Care Genora Arp: Ria Bush Other Clinician: Referring Lyndall Bellot: Ria Bush Treating Dameisha Tschida/Extender: Melburn Hake, HOYT Weeks in Treatment: 2 Compression Therapy Performed for Wound Assessment: Wound #4 Left,Distal Lower Leg Performed By: Clinician Cornell Barman, RN Compression Type: Three Hydrologist) Signed: 05/04/2020 1:50:11 PM By: Gretta Cool, BSN, RN, CWS, Kim RN, BSN Entered By: Gretta Cool, BSN, RN, CWS, Kim on 05/04/2020 13:50:10 [name] ([id number]) -------------------------------------------------------------------------------- Encounter Discharge Information Details Patient Name: [name] Date of  Service: 05/04/2020 11:00 AM Medical Record Number: [id number] Patient Account Number: [id number] Date of Birth/Sex: [birth year] (74 y.o. M) Treating RN: Cornell Barman Primary Care Quillan Whitter: Ria Bush Other Clinician: Referring Sarahlynn Cisnero: Ria Bush Treating Karyss Frese/Extender: Melburn Hake, HOYT Weeks in Treatment: 2 Encounter Discharge Information Items Discharge Condition: Stable Ambulatory Status: Wheelchair Discharge Destination: Home Transportation: Private Auto Accompanied By: wife Schedule Follow-up Appointment: Yes Clinical Summary of Care: Electronic Signature(s) Signed: 05/04/2020 1:50:42 PM By: Gretta Cool, BSN, RN, CWS, Kim RN, BSN Entered By: Gretta Cool, BSN, RN, CWS, Kim on 05/04/2020 13:50:42 [name] ([id number]) -------------------------------------------------------------------------------- Pain Assessment Details Patient Name: [name] Date of Service: 05/04/2020 11:00 AM Medical Record Number: [id number] Patient Account Number: [id number] Date of Birth/Sex: 09/16/1946 (74 y.o. M) Treating RN: Cornell Barman Primary Care Rubee Vega: Ria Bush Other Clinician: Referring [place]: Ria Bush Treating Riko Lumsden/Extender: Melburn Hake, HOYT Weeks in Treatment: 2 Active Problems Location of Pain Severity and Description of Pain Patient Has Paino Yes Site Locations Rate the pain. Current Pain Level: 5 Character of Pain Describe the Pain: Aching Pain Management and Medication Current Pain Management: Electronic Signature(s) Signed: 05/04/2020 2:01:12 PM By: Gretta Cool, BSN, RN, CWS, Kim RN, BSN Entered By: Gretta Cool, BSN, RN, CWS, Kim on 05/04/2020 11:40:09 [name] ([id number]) -------------------------------------------------------------------------------- Wound Assessment Details Patient Name: [name] Date of Service: 05/04/2020 11:00 AM Medical Record Number: [id number] Patient Account Number: [id number] Date of Birth/Sex: 10/16/1946 (74 y.o.  M) Treating RN: Cornell Barman Primary Care Joanmarie Tsang: Ria Bush Other Clinician: Referring Ezreal Turay: Ria Bush Treating Faithe Ariola/Extender: STONE III, HOYT Weeks in Treatment: 2 Wound Status Wound Number: 4 Primary 2nd degree Burn Etiology: Wound Location: Left, Distal Lower Leg Wound Open Wounding Event: Thermal Burn Status: Date Acquired: 04/12/2020 Comorbid Sleep Apnea, Arrhythmia, Hypertension, Type II Weeks Of Treatment: 2 History: Diabetes, Osteoarthritis, Neuropathy, Received Radiation Clustered Wound: No Photos Wound Measurements Length: (cm) 12 Width: (cm) 6 Depth: (cm) 0.1 Area: (cm) 56.549 Volume: (cm) 5.655 % Reduction in Area: 39.6% % Reduction in Volume: 39.6% Epithelialization: Small (1-33%) Tunneling: No  Undermining: No Wound Description Classification: Full Thickness Without Exposed Support Structu Wound Margin: Flat and Intact Exudate Amount: Medium Exudate Type: Serosanguineous Exudate Color: red, brown res Foul Odor After Cleansing: No Slough/Fibrino Yes Wound Bed Granulation Amount: Medium (34-66%) Exposed Structure Granulation Quality: Red Fascia Exposed: No Necrotic Amount: Medium (34-66%) Fat Layer (Subcutaneous Tissue) Exposed: Yes Necrotic Quality: Adherent Slough Tendon Exposed: No Muscle Exposed: No Joint Exposed: No Bone Exposed: No Treatment Notes Wound #4 (Left, Distal Lower Leg) Notes adaptic, h.blue, ABD, 3 layer Electronic Signature(s) Signed: 05/04/2020 2:01:12 PM By: Gretta Cool, BSN, RN, CWS, Kim RN, BSN [place], [place] ([id number]) Entered By: Gretta Cool, BSN, RN, CWS, Kim on 05/04/2020 11:51:20

## 2020-05-05 ENCOUNTER — Encounter: Payer: Self-pay | Admitting: Family Medicine

## 2020-05-05 DIAGNOSIS — T17308A Unspecified foreign body in larynx causing other injury, initial encounter: Secondary | ICD-10-CM | POA: Insufficient documentation

## 2020-05-05 MED ORDER — METFORMIN HCL ER 500 MG PO TB24: 1000.0000 mg | ORAL_TABLET | Freq: Every day | ORAL | 3 refills | Status: DC

## 2020-05-05 MED ORDER — GABAPENTIN 600 MG PO TABS: 600.0000 mg | ORAL_TABLET | Freq: Four times a day (QID) | ORAL | 11 refills | Status: DC

## 2020-05-05 MED ORDER — PRAVASTATIN SODIUM 20 MG PO TABS: 20.0000 mg | ORAL_TABLET | Freq: Every day | ORAL | 3 refills | Status: DC

## 2020-05-05 MED ORDER — OMEPRAZOLE 40 MG PO CPDR: 40.0000 mg | DELAYED_RELEASE_CAPSULE | Freq: Every day | ORAL | 3 refills | Status: DC

## 2020-05-05 NOTE — Assessment & Plan Note (Signed)
He states medicare will not cover scooter PMD, asks about eligibility for power wheelchair - advised I would review criteria but don't think he will qualify due to being able to maintain postural stability and position as well as able to transfer to and from scooter.

## 2020-05-05 NOTE — Assessment & Plan Note (Addendum)
Recent A1c too high - despite metformin XR 2000mg  daily. Will need to review alternative treatment. Continues gabapentin 600mg  QID

## 2020-05-05 NOTE — Assessment & Plan Note (Addendum)
Doesn't think he would be able to quit smoking- interested in retrial of chantix. Previously only tried starter dose for 1 month. Has had vivid dreams and intolerance of chantix before, but then also tolerated well. Will retrial chantix per pt request - starter and continuing month packs sent to pharmacy.

## 2020-05-05 NOTE — Assessment & Plan Note (Signed)
Encouraged healthy diet and lifestyle choices. 

## 2020-05-05 NOTE — Assessment & Plan Note (Signed)
Iron stores stable off iron replacement - will continue to monitor.

## 2020-05-05 NOTE — Assessment & Plan Note (Signed)
Stable period. Notes decreased appetite.

## 2020-05-05 NOTE — Assessment & Plan Note (Addendum)
Completed radiation therapy, good report by onc recently. Latest PSA 0.03

## 2020-05-05 NOTE — Progress Notes (Signed)
I have collaborated with the care management provider regarding care management and care coordination activities outlined in this encounter and have reviewed this encounter including documentation in the note and care plan. I am certifying that I agree with the content of this note and encounter as supervising physician.  

## 2020-05-05 NOTE — Assessment & Plan Note (Signed)
Chronic, stable on pravastatin low dose. Intolerant to more potent statins. Continue. The 10-year ASCVD risk score Mikey Bussing DC Brooke Bonito., et al., 2013) is: 50.8%   Values used to calculate the score:     Age: 74 years     Sex: Male     Is Non-Hispanic African American: No     Diabetic: Yes     Tobacco smoker: Yes     Systolic Blood Pressure: 123XX123 mmHg     Is BP treated: Yes     HDL Cholesterol: 34.3 mg/dL     Total Cholesterol: 132 mg/dL

## 2020-05-05 NOTE — Assessment & Plan Note (Signed)
S/p 4v fenestrated repair 2018 by [facility]

## 2020-05-05 NOTE — Assessment & Plan Note (Signed)
Continue statin. 

## 2020-05-05 NOTE — Assessment & Plan Note (Signed)
Continues healing well. Appreciate wound clinic care.

## 2020-05-05 NOTE — Assessment & Plan Note (Signed)
Appreciate [facility]VS care, last saw 11/2019

## 2020-05-05 NOTE — Assessment & Plan Note (Signed)
[place] CSRS reviewed  ?

## 2020-05-05 NOTE — Assessment & Plan Note (Signed)
Levels repleted - will finish 50k u weekly dose and then start 2000 IU daily.

## 2020-05-05 NOTE — Assessment & Plan Note (Addendum)
Endorses intermittent trouble with choking on food - pending head CT. Discussed possible swallow evaluation (barium swallow vs MBS) for further evaluation.

## 2020-05-05 NOTE — Assessment & Plan Note (Signed)
Levels high - will drop b12 replacement to 530mcg daily.

## 2020-05-05 NOTE — Assessment & Plan Note (Signed)
Advanced directive discussion - working on this at home. Wife would be HCPOA.  

## 2020-05-05 NOTE — Assessment & Plan Note (Signed)
Continues QID gabapentin

## 2020-05-05 NOTE — Assessment & Plan Note (Addendum)
On coumadin and amiodarone. Pending coumadin clinic later this week.

## 2020-05-05 NOTE — Assessment & Plan Note (Signed)
Pending head CT.

## 2020-05-09 ENCOUNTER — Ambulatory Visit
Admission: RE | Admit: 2020-05-09 | Discharge: 2020-05-09 | Disposition: A | Discharge status: Home / Self Care | Payer: Medicare Other | Source: Ambulatory Visit | Attending: Family Medicine | Admitting: Family Medicine

## 2020-05-09 ENCOUNTER — Encounter: Payer: Medicare Other | Attending: Internal Medicine | Admitting: Internal Medicine

## 2020-05-09 ENCOUNTER — Telehealth: Payer: Self-pay

## 2020-05-09 ENCOUNTER — Other Ambulatory Visit: Payer: Self-pay

## 2020-05-09 DIAGNOSIS — X58XXXD Exposure to other specified factors, subsequent encounter: Secondary | ICD-10-CM | POA: Insufficient documentation

## 2020-05-09 DIAGNOSIS — T24202D Burn of second degree of unspecified site of left lower limb, except ankle and foot, subsequent encounter: Secondary | ICD-10-CM | POA: Insufficient documentation

## 2020-05-09 DIAGNOSIS — L97821 Non-pressure chronic ulcer of other part of left lower leg limited to breakdown of skin: Secondary | ICD-10-CM | POA: Insufficient documentation

## 2020-05-09 DIAGNOSIS — G4730 Sleep apnea, unspecified: Secondary | ICD-10-CM | POA: Insufficient documentation

## 2020-05-09 DIAGNOSIS — Z8546 Personal history of malignant neoplasm of prostate: Secondary | ICD-10-CM | POA: Insufficient documentation

## 2020-05-09 DIAGNOSIS — IMO0002 Reserved for concepts with insufficient information to code with codable children: Secondary | ICD-10-CM

## 2020-05-09 DIAGNOSIS — I4891 Unspecified atrial fibrillation: Secondary | ICD-10-CM | POA: Insufficient documentation

## 2020-05-09 DIAGNOSIS — I10 Essential (primary) hypertension: Secondary | ICD-10-CM | POA: Diagnosis not present

## 2020-05-09 DIAGNOSIS — E11622 Type 2 diabetes mellitus with other skin ulcer: Secondary | ICD-10-CM | POA: Diagnosis not present

## 2020-05-09 DIAGNOSIS — E1142 Type 2 diabetes mellitus with diabetic polyneuropathy: Secondary | ICD-10-CM | POA: Insufficient documentation

## 2020-05-09 DIAGNOSIS — R2689 Other abnormalities of gait and mobility: Secondary | ICD-10-CM | POA: Insufficient documentation

## 2020-05-09 DIAGNOSIS — M1990 Unspecified osteoarthritis, unspecified site: Secondary | ICD-10-CM | POA: Diagnosis not present

## 2020-05-09 DIAGNOSIS — Z7901 Long term (current) use of anticoagulants: Secondary | ICD-10-CM | POA: Insufficient documentation

## 2020-05-09 DIAGNOSIS — L97111 Non-pressure chronic ulcer of right thigh limited to breakdown of skin: Secondary | ICD-10-CM | POA: Insufficient documentation

## 2020-05-09 DIAGNOSIS — E1165 Type 2 diabetes mellitus with hyperglycemia: Secondary | ICD-10-CM | POA: Diagnosis not present

## 2020-05-09 DIAGNOSIS — T24232A Burn of second degree of left lower leg, initial encounter: Secondary | ICD-10-CM | POA: Diagnosis not present

## 2020-05-09 DIAGNOSIS — E1140 Type 2 diabetes mellitus with diabetic neuropathy, unspecified: Secondary | ICD-10-CM | POA: Insufficient documentation

## 2020-05-09 DIAGNOSIS — R42 Dizziness and giddiness: Secondary | ICD-10-CM | POA: Diagnosis not present

## 2020-05-09 NOTE — Telephone Encounter (Signed)
Jannet Askew left v/m(DPR signed) requesting cb on status of documentation for support of hover round power chair rather than a scooter. Was discussed at 05/02/20 [place]. Hover round does not have info about power chair. [name] request cb.

## 2020-05-10 NOTE — Progress Notes (Signed)
BALDWIN, KRISTIANSEN ([id number]) Visit Report for 05/09/2020 Arrival Information Details Patient Name: [name], [name] Date of Service: 05/09/2020 8:45 AM Medical Record Number: [id number] Patient Account Number: [id number] Date of Birth/Sex: 04-Apr-1946 (74 y.o. M) Treating RN: Montey Hora Primary Care Inaya Gillham: Ria Bush Other Clinician: Referring Kani Jobson: Ria Bush Treating Shakerra Red/Extender: Tito Dine in Treatment: 3 Visit Information History Since Last Visit Added or deleted any medications: No Patient Arrived: Wheel Chair Any new allergies or adverse reactions: No Arrival Time: 09:16 Had a fall or experienced change in No Accompanied By: wife activities of daily living that may affect Transfer Assistance: None risk of falls: Patient Identification Verified: Yes Signs or symptoms of abuse/neglect since last visito No Secondary Verification Process Completed: Yes Hospitalized since last visit: No Implantable device outside of the clinic excluding No cellular tissue based products placed in the center since last visit: Has Dressing in Place as Prescribed: Yes Has Compression in Place as Prescribed: Yes Pain Present Now: No Electronic Signature(s) Signed: 05/09/2020 4:33:52 PM By: Montey Hora Entered By: Montey Hora on 05/09/2020 09:16:48 [name] ([id number]) -------------------------------------------------------------------------------- Compression Therapy Details Patient Name: [name] Date of Service: 05/09/2020 8:45 AM Medical Record Number: [id number] Patient Account Number: [id number] Date of Birth/Sex: 02-28-1946 (74 y.o. M) Treating RN: Cornell Barman Primary Care Heydi Swango: Ria Bush Other Clinician: Referring Niambi Smoak: Ria Bush Treating Lenka Zhao/Extender: Tito Dine in Treatment: 3 Compression Therapy Performed for Wound Assessment: Wound #4 Left,Distal Lower Leg Performed By: Clinician Cornell Barman, RN Compression Type: Three Layer Post Procedure Diagnosis Same as Pre-procedure Notes Patient tolerating wraps well. Electronic Signature(s) Signed: 05/10/2020 1:40:27 PM By: Gretta Cool, BSN, RN, CWS, Kim RN, BSN Entered By: Gretta Cool, BSN, RN, CWS, Kim on 05/09/2020 09:39:12 [name] ([id number]) -------------------------------------------------------------------------------- Encounter Discharge Information Details Patient Name: [name] Date of Service: 05/09/2020 8:45 AM Medical Record Number: [id number] Patient Account Number: [id number] Date of Birth/Sex: 1946-10-14 (74 y.o. M) Treating RN: Cornell Barman Primary Care Kortlyn Koltz: Ria Bush Other Clinician: Referring Allia Wiltsey: Ria Bush Treating Cloee Dunwoody/Extender: Tito Dine in Treatment: 3 Encounter Discharge Information Items Discharge Condition: Stable Ambulatory Status: Wheelchair Discharge Destination: Home Transportation: Private Auto Accompanied By: self Schedule Follow-up Appointment: Yes Clinical Summary of Care: Electronic Signature(s) Signed: 05/10/2020 1:40:27 PM By: Gretta Cool, BSN, RN, CWS, Kim RN, BSN Entered By: Gretta Cool, BSN, RN, CWS, Kim on 05/09/2020 09:42:05 [name] ([id number]) -------------------------------------------------------------------------------- Lower Extremity Assessment Details Patient Name: [name] Date of Service: 05/09/2020 8:45 AM Medical Record Number: [id number] Patient Account Number: [id number] Date of Birth/Sex: 11-07-1946 (74 y.o. M) Treating RN: Montey Hora Primary Care Flara Storti: Ria Bush Other Clinician: Referring Ezra Denne: Ria Bush Treating Kiah Keay/Extender: Tito Dine in Treatment: 3 Edema Assessment Assessed: [Left: No] [Right: No] Edema: [Left: Ye] [Right: s] Calf Left: Right: Point of Measurement: 34 cm From Medial Instep 33 cm cm Ankle Left: Right: Point of Measurement: 10 cm From Medial Instep  23.5 cm cm Vascular Assessment Pulses: Dorsalis Pedis Palpable: [Left:Yes] Electronic Signature(s) Signed: 05/09/2020 4:33:52 PM By: Montey Hora Entered By: Montey Hora on 05/09/2020 09:23:25 [name] ([id number]) -------------------------------------------------------------------------------- Multi Wound Chart Details Patient Name: [name] Date of Service: 05/09/2020 8:45 AM Medical Record Number: [id number] Patient Account Number: [id number] Date of Birth/Sex: December 07, 1946 (74 y.o. M) Treating RN: Cornell Barman Primary Care Paislei Dorval: Ria Bush Other Clinician: Referring Marcelle Bebout: Ria Bush Treating Isaiha Asare/Extender: Tito Dine in Treatment: 3 Vital Signs Height(in): 72 Pulse(bpm): 45  Weight(lbs): 219 Blood Pressure(mmHg): 133/86 Body Mass Index(BMI): 30 Temperature(F): 97.5 Respiratory Rate(breaths/min): 16 Photos: [N/A:N/A] Wound Location: Left, Distal Lower Leg N/A N/A Wounding Event: Thermal Burn N/A N/A Primary Etiology: 2nd degree Burn N/A N/A Comorbid History: Sleep Apnea, Arrhythmia, N/A N/A Hypertension, Type II Diabetes, Osteoarthritis, Neuropathy, Received Radiation Date Acquired: 04/12/2020 N/A N/A Weeks of Treatment: 3 N/A N/A Wound Status: Open N/A N/A Measurements L x W x D (cm) 12.1x5.8x0.1 N/A N/A Area (cm) : 55.119 N/A N/A Volume (cm) : 5.512 N/A N/A % Reduction in Area: 41.10% N/A N/A % Reduction in Volume: 41.10% N/A N/A Classification: Full Thickness Without Exposed N/A N/A Support Structures Exudate Amount: Medium N/A N/A Exudate Type: Serosanguineous N/A N/A Exudate Color: red, brown N/A N/A Wound Margin: Flat and Intact N/A N/A Granulation Amount: Medium (34-66%) N/A N/A Granulation Quality: Red N/A N/A Necrotic Amount: Medium (34-66%) N/A N/A Exposed Structures: Fat Layer (Subcutaneous Tissue) N/A N/A Exposed: Yes Fascia: No Tendon: No Muscle: No Joint: No Bone: No Epithelialization: Small  (1-33%) N/A N/A Procedures Performed: Compression Therapy N/A N/A Treatment Notes Wound #4 (Left, Distal Lower Leg) Notes iodoflex, ABD, 3 layer [name], [name] ([id number]) Electronic Signature(s) Signed: 05/09/2020 4:41:32 PM By: Linton Ham MD Entered By: Linton Ham on 05/09/2020 09:53:44 [name] ([id number]) -------------------------------------------------------------------------------- Multi-Disciplinary Care Plan Details Patient Name: [name] Date of Service: 05/09/2020 8:45 AM Medical Record Number: [id number] Patient Account Number: [id number] Date of Birth/Sex: 04/01/1946 (74 y.o. M) Treating RN: Cornell Barman Primary Care Zhane Bluitt: Ria Bush Other Clinician: Referring Beverlee Wilmarth: Ria Bush Treating Shanley Furlough/Extender: Tito Dine in Treatment: 3 Active Inactive Necrotic Tissue Nursing Diagnoses: Impaired tissue integrity related to necrotic/devitalized tissue Goals: Necrotic/devitalized tissue will be minimized in the wound bed Date Initiated: 04/18/2020 Target Resolution Date: 05/02/2020 Goal Status: Active Interventions: Assess patient pain level pre-, during and post procedure and prior to discharge Notes: Orientation to the Wound Care Program Nursing Diagnoses: Knowledge deficit related to the wound healing center program Goals: Patient/caregiver will verbalize understanding of the [place] Date Initiated: 04/18/2020 Target Resolution Date: 05/02/2020 Goal Status: Active Interventions: Provide education on orientation to the wound center Notes: Pain, Acute or Chronic Nursing Diagnoses: Pain, acute or chronic: actual or potential Potential alteration in comfort, pain Goals: Patient/caregiver will verbalize adequate pain control between visits Date Initiated: 04/18/2020 Target Resolution Date: 05/02/2020 Goal Status: Active Interventions: Assess comfort goal upon admission Treatment  Activities: Refer to pain specialist or other pain support program : 04/18/2020 Notes: Wound/Skin Impairment Nursing Diagnoses: Impaired tissue integrity Goals: [name] ([id number]) Ulcer/skin breakdown will have a volume reduction of 30% by week 4 Date Initiated: 04/18/2020 Target Resolution Date: 05/19/2020 Goal Status: Active Interventions: Assess ulceration(s) every visit Treatment Activities: Skin care regimen initiated : 04/18/2020 Topical wound management initiated : 04/18/2020 Notes: Electronic Signature(s) Signed: 05/10/2020 1:40:27 PM By: Gretta Cool, BSN, RN, CWS, Kim RN, BSN Entered By: Gretta Cool, BSN, RN, CWS, Kim on 05/09/2020 09:37:23 [name] ([id number]) -------------------------------------------------------------------------------- Pain Assessment Details Patient Name: [name] Date of Service: 05/09/2020 8:45 AM Medical Record Number: [id number] Patient Account Number: [id number] Date of Birth/Sex: Apr 07, 1946 (74 y.o. M) Treating RN: Montey Hora Primary Care Jarely Juncaj: Ria Bush Other Clinician: Referring Carnie Bruemmer: Ria Bush Treating Basheer Molchan/Extender: Tito Dine in Treatment: 3 Active Problems Location of Pain Severity and Description of Pain Patient Has Paino Yes Site Locations Pain Location: Pain in Ulcers With Dressing Change: Yes Duration of the Pain. Constant / Intermittento  Constant Pain Management and Medication Current Pain Management: Electronic Signature(s) Signed: 05/09/2020 4:33:52 PM By: Montey Hora Entered By: Montey Hora on 05/09/2020 09:21:51 [name] ([id number]) -------------------------------------------------------------------------------- Patient/Caregiver Education Details Patient Name: [name] Date of Service: 05/09/2020 8:45 AM Medical Record Number: [id number] Patient Account Number: [id number] Date of Birth/Gender: [birth year] (74 y.o. M) Treating RN: Cornell Barman Primary  Care Physician: Ria Bush Other Clinician: Referring Physician: Ria Bush Treating Physician/Extender: Tito Dine in Treatment: 3 Education Assessment Education Provided To: Patient Education Topics Provided Wound/Skin Impairment: Handouts: Caring for Your Ulcer Methods: Demonstration, Explain/Verbal Responses: State content correctly Electronic Signature(s) Signed: 05/10/2020 1:40:27 PM By: Gretta Cool, BSN, RN, CWS, Kim RN, BSN Entered By: Gretta Cool, BSN, RN, CWS, Kim on 05/09/2020 09:40:57 [name] ([id number]) -------------------------------------------------------------------------------- Wound Assessment Details Patient Name: [name] Date of Service: 05/09/2020 8:45 AM Medical Record Number: [id number] Patient Account Number: [id number] Date of Birth/Sex: Jun 30, 1946 (74 y.o. M) Treating RN: Montey Hora Primary Care Osama Coleson: Ria Bush Other Clinician: Referring Wilbon Obenchain: Ria Bush Treating Kevante Lunt/Extender: Tito Dine in Treatment: 3 Wound Status Wound Number: 4 Primary 2nd degree Burn Etiology: Wound Location: Left, Distal Lower Leg Wound Open Wounding Event: Thermal Burn Status: Date Acquired: 04/12/2020 Comorbid Sleep Apnea, Arrhythmia, Hypertension, Type II Weeks Of Treatment: 3 History: Diabetes, Osteoarthritis, Neuropathy, Received Radiation Clustered Wound: No Photos Wound Measurements Length: (cm) 12.1 Width: (cm) 5.8 Depth: (cm) 0.1 Area: (cm) 55.119 Volume: (cm) 5.512 % Reduction in Area: 41.1% % Reduction in Volume: 41.1% Epithelialization: Small (1-33%) Tunneling: No Undermining: No Wound Description Classification: Full Thickness Without Exposed Support Structu Wound Margin: Flat and Intact Exudate Amount: Medium Exudate Type: Serosanguineous Exudate Color: red, brown res Foul Odor After Cleansing: No Slough/Fibrino Yes Wound Bed Granulation Amount: Medium (34-66%) Exposed  Structure Granulation Quality: Red Fascia Exposed: No Necrotic Amount: Medium (34-66%) Fat Layer (Subcutaneous Tissue) Exposed: Yes Necrotic Quality: Adherent Slough Tendon Exposed: No Muscle Exposed: No Joint Exposed: No Bone Exposed: No Treatment Notes Wound #4 (Left, Distal Lower Leg) Notes iodoflex, ABD, 3 layer Electronic Signature(s) Signed: 05/09/2020 4:33:52 PM By: Darryl Lent ([id number]) Entered By: Montey Hora on 05/09/2020 09:26:58 [name] ([id number]) -------------------------------------------------------------------------------- Vitals Details Patient Name: [name] Date of Service: 05/09/2020 8:45 AM Medical Record Number: [id number] Patient Account Number: [id number] Date of Birth/Sex: February 05, 1946 (74 y.o. M) Treating RN: Montey Hora Primary Care Shuntae Herzig: Ria Bush Other Clinician: Referring Bernard Donahoo: Ria Bush Treating Milliana Reddoch/Extender: Tito Dine in Treatment: 3 Vital Signs Time Taken: 09:22 Temperature (F): 97.5 Height (in): 72 Pulse (bpm): 45 Weight (lbs): 219 Respiratory Rate (breaths/min): 16 Body Mass Index (BMI): 29.7 Blood Pressure (mmHg): 133/86 Reference Range: 80 - 120 mg / dl Electronic Signature(s) Signed: 05/09/2020 4:33:52 PM By: Montey Hora Entered By: Montey Hora on 05/09/2020 09:22:37
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
## 2020-05-11 ENCOUNTER — Other Ambulatory Visit: Payer: Self-pay

## 2020-05-11 ENCOUNTER — Encounter: Payer: Medicare Other | Attending: Physician Assistant

## 2020-05-11 DIAGNOSIS — T24202D Burn of second degree of unspecified site of left lower limb, except ankle and foot, subsequent encounter: Secondary | ICD-10-CM | POA: Insufficient documentation

## 2020-05-11 DIAGNOSIS — X088XXD Exposure to other specified smoke, fire and flames, subsequent encounter: Secondary | ICD-10-CM | POA: Diagnosis not present

## 2020-05-11 DIAGNOSIS — T24202A Burn of second degree of unspecified site of left lower limb, except ankle and foot, initial encounter: Secondary | ICD-10-CM | POA: Diagnosis present

## 2020-05-11 DIAGNOSIS — T310 Burns involving less than 10% of body surface: Secondary | ICD-10-CM | POA: Diagnosis not present

## 2020-05-11 NOTE — Telephone Encounter (Signed)
Message left on triage line from [place] with [employer] stating they needed to speak to someone about the paperwork they received. Said to call [phone] Ref [phone]

## 2020-05-14 ENCOUNTER — Other Ambulatory Visit: Payer: Self-pay

## 2020-05-14 DIAGNOSIS — E11622 Type 2 diabetes mellitus with other skin ulcer: Secondary | ICD-10-CM | POA: Diagnosis not present

## 2020-05-14 MED ORDER — GLIPIZIDE 5 MG PO TABS: 5.0000 mg | ORAL_TABLET | Freq: Every day | ORAL | 3 refills | Status: DC

## 2020-05-14 NOTE — Telephone Encounter (Signed)
Spoke with pt's wife, [name] (on dpr), relaying Dr. G's message.  She verbalizes understanding and will inform pt.  

## 2020-05-14 NOTE — Telephone Encounter (Signed)
Ok to add glipizide 5mg  daily with breakfast. plz emphasize needs to always take with food.  Will forwad to [name] as I am no longer able to reply back to [name].

## 2020-05-16 ENCOUNTER — Encounter: Payer: Medicare Other | Admitting: Internal Medicine

## 2020-05-16 ENCOUNTER — Encounter: Payer: Self-pay | Admitting: Family Medicine

## 2020-05-16 ENCOUNTER — Ambulatory Visit (INDEPENDENT_AMBULATORY_CARE_PROVIDER_SITE_OTHER): Payer: Medicare Other | Admitting: Family Medicine

## 2020-05-16 ENCOUNTER — Telehealth: Payer: Self-pay

## 2020-05-16 ENCOUNTER — Other Ambulatory Visit: Payer: Self-pay

## 2020-05-16 VITALS — BP 122/60 | HR 54 | Temp 97.6°F | Ht 70.75 in | Wt 220.0 lb

## 2020-05-16 DIAGNOSIS — T24232D Burn of second degree of left lower leg, subsequent encounter: Secondary | ICD-10-CM

## 2020-05-16 DIAGNOSIS — G6289 Other specified polyneuropathies: Secondary | ICD-10-CM | POA: Diagnosis not present

## 2020-05-16 DIAGNOSIS — E11622 Type 2 diabetes mellitus with other skin ulcer: Secondary | ICD-10-CM | POA: Diagnosis not present

## 2020-05-16 DIAGNOSIS — R2689 Other abnormalities of gait and mobility: Secondary | ICD-10-CM | POA: Diagnosis not present

## 2020-05-16 DIAGNOSIS — E1142 Type 2 diabetes mellitus with diabetic polyneuropathy: Secondary | ICD-10-CM | POA: Diagnosis not present

## 2020-05-16 DIAGNOSIS — L97821 Non-pressure chronic ulcer of other part of left lower leg limited to breakdown of skin: Secondary | ICD-10-CM | POA: Diagnosis not present

## 2020-05-16 DIAGNOSIS — T24232A Burn of second degree of left lower leg, initial encounter: Secondary | ICD-10-CM | POA: Diagnosis not present

## 2020-05-16 DIAGNOSIS — L97111 Non-pressure chronic ulcer of right thigh limited to breakdown of skin: Secondary | ICD-10-CM | POA: Diagnosis not present

## 2020-05-16 MED ORDER — GABAPENTIN 600 MG PO TABS: 600.0000 mg | ORAL_TABLET | Freq: Two times a day (BID) | ORAL | 11 refills | Status: DC

## 2020-05-16 NOTE — Telephone Encounter (Signed)
[facility] - Client TELEPHONE ADVICE RECORD [facility] Patient Name: [name] Gender: Male DOB: 1946-09-28 Age: 74 Y 14 D Return Phone Number: [phone] (Primary) Address: City/State/Zip: [place]  [address] Client [facility] - Client Client Site [place] Physician Ria Bush - MD Contact Type Call Who Is Calling Patient / Member / Family / Caregiver Call Type Triage / Clinical Caller Name [name] Relationship To Patient Spouse Return Phone Number [phone] (Primary) Chief Complaint Weakness, Generalized Reason for Call Symptomatic / Request for [place] states her husband has been experiencing night sweats and weakness. Translation No Nurse Assessment Nurse: Ysidro Evert, RN, Levada Dy Date/Time (Eastern Time): 05/16/2020 9:10:05 AM Confirm and document reason for call. If symptomatic, describe symptoms. ---Caller states he is having weakness at times that makes it difficult for him to do activities and he has been sleeping more than usual. He gets hot and sweats at night Has the patient had close contact with a person known or suspected to have the novel coronavirus illness OR traveled / lives in area with major community spread (including international travel) in the last 14 days from the onset of symptoms? * If Asymptomatic, screen for exposure and travel within the last 14 days. ---No Does the patient have any new or worsening symptoms? ---Yes Will a triage be completed? ---Yes Related visit to physician within the last 2 weeks? ---No Does the PT have any chronic conditions? (i.e. diabetes, asthma, this includes High risk factors for pregnancy, etc.) ---Yes List chronic conditions. ---diabetes Is this a behavioral health or substance abuse call? ---No Guidelines Guideline Title Affirmed Question Affirmed Notes Nurse Date/Time  (Eastern Time) Weakness (Generalized) and Fatigue [1] MODERATE weakness (i.e., interferes with work, school, normal activities) AND [2] cause unknown (Exceptions: weakness Ysidro Evert, RN, Levada Dy 05/16/2020 9:12:33 AMPLEASE NOTE: All timestamps contained within this report are represented as [place] Standard Time. CONFIDENTIALTY NOTICE: This fax transmission is intended only for the addressee. It contains information that is legally privileged, confidential or otherwise protected from use or disclosure. If you are not the intended recipient, you are strictly prohibited from reviewing, disclosing, copying using or disseminating any of this information or taking any action in reliance on or regarding this information. If you have received this fax in error, please notify us immediately by telephone so that we can arrange for its return to [place]. Phone: [phone], Toll-Free: [phone], Fax: [phone] Page: 2 of 2 Call Id: [id number] Guidelines Guideline Title Affirmed Question Affirmed Notes Nurse Date/Time Eilene Ghazi Time) with acute minor illness, or weakness from poor fluid intake) Disp. Time Eilene Ghazi Time) Disposition Final User 05/16/2020 9:17:48 AM See HCP within 4 Hours (or PCP triage) Yes Ysidro Evert, RN, Marin Shutter Disagree/Comply Comply Caller Understands Yes PreDisposition Did not know what to do Care Advice Given Per Guideline SEE HCP WITHIN 4 HOURS (OR PCP TRIAGE): * IF OFFICE WILL BE OPEN: You need to be seen within the next 3 or 4 hours. Call your doctor (or NP/PA) now or as soon as the office opens. CARE ADVICE given per Weakness and Fatigue (Adult) guideline. CALL BACK IF: * You become worse. Referrals REFERRED TO PCP OFFICE
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
## 2020-05-16 NOTE — Telephone Encounter (Signed)
Pt has apt today at 4:15

## 2020-05-16 NOTE — Patient Instructions (Addendum)
You do have peripheral neuropathy which could contribute to unsteadiness. This is likely from diabetes.  We will refer you to neurology.  Labs today.   Peripheral Neuropathy Peripheral neuropathy is a type of nerve damage. It affects nerves that carry signals between the spinal cord and the arms, legs, and the rest of the body (peripheral nerves). It does not affect nerves in the spinal cord or brain. In peripheral neuropathy, one nerve or a group of nerves may be damaged. Peripheral neuropathy is a broad category that includes many specific nerve disorders, like diabetic neuropathy, hereditary neuropathy, and carpal tunnel syndrome. What are the causes? This condition may be caused by:  Diabetes. This is the most common cause of peripheral neuropathy.  Nerve injury.  Pressure or stress on a nerve that lasts a long time.  Lack (deficiency) of B vitamins. This can result from alcoholism, poor diet, or a restricted diet.  Infections.  Autoimmune diseases, such as rheumatoid arthritis and systemic lupus erythematosus.  Nerve diseases that are passed from parent to child (inherited).  Some medicines, such as cancer medicines (chemotherapy).  Poisonous (toxic) substances, such as lead and mercury.  Too little blood flowing to the legs.  Kidney disease.  Thyroid disease. In some cases, the cause of this condition is not known. What are the signs or symptoms? Symptoms of this condition depend on which of your nerves is damaged. Common symptoms include:  Loss of feeling (numbness) in the feet, hands, or both.  Tingling in the feet, hands, or both.  Burning pain.  Very sensitive skin.  Weakness.  Not being able to move a part of the body (paralysis).  Muscle twitching.  Clumsiness or poor coordination.  Loss of balance.  Not being able to control your bladder.  Feeling dizzy.  Sexual problems. How is this diagnosed? Diagnosing and finding the cause of peripheral  neuropathy can be difficult. Your health care provider will take your medical history and do a physical exam. A neurological exam will also be done. This involves checking things that are affected by your brain, spinal cord, and nerves (nervous system). For example, your health care provider will check your reflexes, how you move, and what you can feel. You may have other tests, such as:  Blood tests.  Electromyogram (EMG) and nerve conduction tests. These tests check nerve function and how well the nerves are controlling the muscles.  Imaging tests, such as CT scans or MRI to rule out other causes of your symptoms.  Removing a small piece of nerve to be examined in a lab (nerve biopsy). This is rare.  Removing and examining a small amount of the fluid that surrounds the brain and spinal cord (lumbar puncture). This is rare. How is this treated? Treatment for this condition may involve:  Treating the underlying cause of the neuropathy, such as diabetes, kidney disease, or vitamin deficiencies.  Stopping medicines that can cause neuropathy, such as chemotherapy.  Medicine to relieve pain. Medicines may include: ? Prescription or over-the-counter pain medicine. ? Antiseizure medicine. ? Antidepressants. ? Pain-relieving patches that are applied to painful areas of skin.  Surgery to relieve pressure on a nerve or to destroy a nerve that is causing pain.  Physical therapy to help improve movement and balance.  Devices to help you move around (assistive devices). Follow these instructions at home: Medicines  Take over-the-counter and prescription medicines only as told by your health care provider. Do not take any other medicines without first asking your  health care provider.  Do not drive or use heavy machinery while taking prescription pain medicine. Lifestyle   Do not use any products that contain nicotine or tobacco, such as cigarettes and e-cigarettes. Smoking keeps blood from  reaching damaged nerves. If you need help quitting, ask your health care provider.  Avoid or limit alcohol. Too much alcohol can cause a vitamin B deficiency, and vitamin B is needed for healthy nerves.  Eat a healthy diet. This includes: ? Eating foods that are high in fiber, such as fresh fruits and vegetables, whole grains, and beans. ? Limiting foods that are high in fat and processed sugars, such as fried or sweet foods. General instructions   If you have diabetes, work closely with your health care provider to keep your blood sugar under control.  If you have numbness in your feet: ? Check every day for signs of injury or infection. Watch for redness, warmth, and swelling. ? Wear padded socks and comfortable shoes. These help protect your feet.  Develop a good support system. Living with peripheral neuropathy can be stressful. Consider talking with a mental health specialist or joining a support group.  Use assistive devices and attend physical therapy as told by your health care provider. This may include using a walker or a cane.  Keep all follow-up visits as told by your health care provider. This is important. Contact a health care provider if:  You have new signs or symptoms of peripheral neuropathy.  You are struggling emotionally from dealing with peripheral neuropathy.  Your pain is not well-controlled. Get help right away if:  You have an injury or infection that is not healing normally.  You develop new weakness in an arm or leg.  You fall frequently. Summary  Peripheral neuropathy is when the nerves in the arms, or legs are damaged, resulting in numbness, weakness, or pain.  There are many causes of peripheral neuropathy, including diabetes, pinched nerves, vitamin deficiencies, autoimmune disease, and hereditary conditions.  Diagnosing and finding the cause of peripheral neuropathy can be difficult. Your health care provider will take your medical history,  do a physical exam, and do tests, including blood tests and nerve function tests.  Treatment involves treating the underlying cause of the neuropathy and taking medicines to help control pain. Physical therapy and assistive devices may also help. This information is not intended to replace advice given to you by your health care provider. Make sure you discuss any questions you have with your health care provider. Document Revised: 11/06/2017 Document Reviewed: 02/02/2017 [employer] Patient Education  2020 [employer].

## 2020-05-18 ENCOUNTER — Other Ambulatory Visit: Payer: Self-pay

## 2020-05-18 ENCOUNTER — Encounter: Payer: Self-pay | Admitting: Family Medicine

## 2020-05-18 DIAGNOSIS — E11622 Type 2 diabetes mellitus with other skin ulcer: Secondary | ICD-10-CM | POA: Diagnosis not present
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
## 2020-05-18 NOTE — Progress Notes (Signed)
JABORI, HENEGAR ([id number]) Visit Report for 05/16/2020 Arrival Information Details Patient Name: [name], [name] Date of Service: 05/16/2020 1:15 PM Medical Record Number: [id number] Patient Account Number: [id number] Date of Birth/Sex: 03/27/1946 (74 y.o. M) Treating RN: Cornell Barman Primary Care Tanga Gloor: Ria Bush Other Clinician: Referring Mana Morison: Ria Bush Treating Rayelle Armor/Extender: Tito Dine in Treatment: 4 Visit Information History Since Last Visit Added or deleted any medications: No Patient Arrived: Cane Any new allergies or adverse reactions: No Arrival Time: 13:28 Had a fall or experienced change in No Accompanied By: wife activities of daily living that may affect Transfer Assistance: None risk of falls: Signs or symptoms of abuse/neglect since last visito No Hospitalized since last visit: No Implantable device outside of the clinic excluding No cellular tissue based products placed in the center since last visit: Has Dressing in Place as Prescribed: Yes Has Compression in Place as Prescribed: Yes Pain Present Now: Yes Electronic Signature(s) Signed: 05/17/2020 11:39:41 AM By: Lorine Bears RCP, RRT, CHT Entered By: Lorine Bears on 05/16/2020 13:29:16 [name] ([id number]) -------------------------------------------------------------------------------- Compression Therapy Details Patient Name: [name] Date of Service: 05/16/2020 1:15 PM Medical Record Number: [id number] Patient Account Number: [id number] Date of Birth/Sex: 10/17/1946 (74 y.o. M) Treating RN: Cornell Barman Primary Care Johni Narine: Ria Bush Other Clinician: Referring Jaydah Stahle: Ria Bush Treating Brittnie Lewey/Extender: Tito Dine in Treatment: 4 Compression Therapy Performed for Wound Assessment: Wound #4 Left,Distal Lower Leg Performed By: Clinician Cornell Barman, RN Compression Type: Three Layer Post  Procedure Diagnosis Same as Pre-procedure Notes Tolerated wraps well Electronic Signature(s) Signed: 05/18/2020 12:58:54 PM By: Gretta Cool, BSN, RN, CWS, Kim RN, BSN Entered By: Gretta Cool, BSN, RN, CWS, Kim on 05/16/2020 14:03:01 [name] ([id number]) -------------------------------------------------------------------------------- Encounter Discharge Information Details Patient Name: [name] Date of Service: 05/16/2020 1:15 PM Medical Record Number: [id number] Patient Account Number: [id number] Date of Birth/Sex: 13-Oct-1946 (74 y.o. M) Treating RN: Cornell Barman Primary Care Daphyne Miguez: Ria Bush Other Clinician: Referring Jerilyn Gillaspie: Ria Bush Treating Kalliopi Coupland/Extender: Tito Dine in Treatment: 4 Encounter Discharge Information Items Discharge Condition: Stable Ambulatory Status: Ambulatory Discharge Destination: Home Transportation: Private Auto Accompanied By: self Schedule Follow-up Appointment: Yes Clinical Summary of Care: Electronic Signature(s) Signed: 05/18/2020 12:58:54 PM By: Gretta Cool, BSN, RN, CWS, Kim RN, BSN Entered By: Gretta Cool, BSN, RN, CWS, Kim on 05/16/2020 14:04:38 [name] ([id number]) -------------------------------------------------------------------------------- Lower Extremity Assessment Details Patient Name: [name] Date of Service: 05/16/2020 1:15 PM Medical Record Number: [id number] Patient Account Number: [id number] Date of Birth/Sex: 03/27/1946 (74 y.o. M) Treating RN: Army Melia Primary Care Shantavia Jha: Ria Bush Other Clinician: Referring Zedric Deroy: Ria Bush Treating Cleora Karnik/Extender: Tito Dine in Treatment: 4 Edema Assessment Assessed: [Left: No] [Right: No] Edema: [Left: N] [Right: o] Calf Left: Right: Point of Measurement: 34 cm From Medial Instep 33 cm cm Ankle Left: Right: Point of Measurement: 10 cm From Medial Instep 23 cm cm Vascular Assessment Pulses: Dorsalis  Pedis Palpable: [Left:Yes] Electronic Signature(s) Signed: 05/16/2020 2:58:20 PM By: Army Melia Entered By: Army Melia on 05/16/2020 13:40:09 [name] ([id number]) -------------------------------------------------------------------------------- Multi Wound Chart Details Patient Name: [name] Date of Service: 05/16/2020 1:15 PM Medical Record Number: [id number] Patient Account Number: [id number] Date of Birth/Sex: Aug 23, 1946 (74 y.o. M) Treating RN: Cornell Barman Primary Care Donald Memoli: Ria Bush Other Clinician: Referring Ameliyah Sarno: Ria Bush Treating Avy Barlett/Extender: Tito Dine in Treatment: 4 Vital Signs Height(in): 72 Pulse(bpm): 48 Weight(lbs): 219 Blood Pressure(mmHg): 126/64 Body  Mass Index(BMI): 30 Temperature(F): 98.0 Respiratory Rate(breaths/min): 18 Photos: [N/A:N/A] Wound Location: Left, Distal Lower Leg N/A N/A Wounding Event: Thermal Burn N/A N/A Primary Etiology: 2nd degree Burn N/A N/A Comorbid History: Sleep Apnea, Arrhythmia, N/A N/A Hypertension, Type II Diabetes, Osteoarthritis, Neuropathy, Received Radiation Date Acquired: 04/12/2020 N/A N/A Weeks of Treatment: 4 N/A N/A Wound Status: Open N/A N/A Measurements L x W x D (cm) 7.5x5.5x0.1 N/A N/A Area (cm) : 32.398 N/A N/A Volume (cm) : 3.24 N/A N/A % Reduction in Area: 65.40% N/A N/A % Reduction in Volume: 65.40% N/A N/A Classification: Full Thickness Without Exposed N/A N/A Support Structures Exudate Amount: Medium N/A N/A Exudate Type: Serous N/A N/A Exudate Color: amber N/A N/A Wound Margin: Flat and Intact N/A N/A Granulation Amount: Large (67-100%) N/A N/A Granulation Quality: Pink N/A N/A Necrotic Amount: Small (1-33%) N/A N/A Exposed Structures: Fat Layer (Subcutaneous Tissue) N/A N/A Exposed: Yes Fascia: No Tendon: No Muscle: No Joint: No Bone: No Epithelialization: Small (1-33%) N/A N/A Procedures Performed: Compression Therapy N/A  N/A Treatment Notes Wound #4 (Left, Distal Lower Leg) Notes iodoflex, ABD, 3 layer [name], [name] ([id number]) Electronic Signature(s) Signed: 05/16/2020 4:11:49 PM By: Linton Ham MD Entered By: Linton Ham on 05/16/2020 14:47:34 [name] ([id number]) -------------------------------------------------------------------------------- Multi-Disciplinary Care Plan Details Patient Name: [name] Date of Service: 05/16/2020 1:15 PM Medical Record Number: [id number] Patient Account Number: [id number] Date of Birth/Sex: June 28, 1946 (74 y.o. M) Treating RN: Cornell Barman Primary Care Vergil Burby: Ria Bush Other Clinician: Referring Dorothy Polhemus: Ria Bush Treating Hutton Pellicane/Extender: Tito Dine in Treatment: 4 Active Inactive Necrotic Tissue Nursing Diagnoses: Impaired tissue integrity related to necrotic/devitalized tissue Goals: Necrotic/devitalized tissue will be minimized in the wound bed Date Initiated: 04/18/2020 Target Resolution Date: 05/02/2020 Goal Status: Active Interventions: Assess patient pain level pre-, during and post procedure and prior to discharge Notes: Orientation to the Wound Care Program Nursing Diagnoses: Knowledge deficit related to the wound healing center program Goals: Patient/caregiver will verbalize understanding of the [place] Date Initiated: 04/18/2020 Target Resolution Date: 05/02/2020 Goal Status: Active Interventions: Provide education on orientation to the wound center Notes: Pain, Acute or Chronic Nursing Diagnoses: Pain, acute or chronic: actual or potential Potential alteration in comfort, pain Goals: Patient/caregiver will verbalize adequate pain control between visits Date Initiated: 04/18/2020 Target Resolution Date: 05/02/2020 Goal Status: Active Interventions: Assess comfort goal upon admission Treatment Activities: Refer to pain specialist or other pain support program :  04/18/2020 Notes: Wound/Skin Impairment Nursing Diagnoses: Impaired tissue integrity Goals: [name] ([id number]) Ulcer/skin breakdown will have a volume reduction of 30% by week 4 Date Initiated: 04/18/2020 Target Resolution Date: 05/19/2020 Goal Status: Active Interventions: Assess ulceration(s) every visit Treatment Activities: Skin care regimen initiated : 04/18/2020 Topical wound management initiated : 04/18/2020 Notes: Electronic Signature(s) Signed: 05/18/2020 12:58:54 PM By: Gretta Cool, BSN, RN, CWS, Kim RN, BSN Entered By: Gretta Cool, BSN, RN, CWS, Kim on 05/16/2020 14:02:10 [name] ([id number]) -------------------------------------------------------------------------------- Pain Assessment Details Patient Name: [name] Date of Service: 05/16/2020 1:15 PM Medical Record Number: [id number] Patient Account Number: [id number] Date of Birth/Sex: [birth year] (74 y.o. M) Treating RN: Army Melia Primary Care Dorethia Jeanmarie: Ria Bush Other Clinician: Referring Damione Robideau: Ria Bush Treating Teagyn Fishel/Extender: Tito Dine in Treatment: 4 Active Problems Location of Pain Severity and Description of Pain Patient Has Paino Yes Site Locations Pain Location: Pain in Ulcers Rate the pain. Current Pain Level: 6 Pain Management and Medication Current Pain Management: Electronic Signature(s) Signed: 05/16/2020  2:58:20 PM By: Army Melia Entered By: Army Melia on 05/16/2020 13:38:39 [name] ([id number]) -------------------------------------------------------------------------------- Patient/Caregiver Education Details Patient Name: [name] Date of Service: 05/16/2020 1:15 PM Medical Record Number: [id number] Patient Account Number: [id number] Date of Birth/Gender: [birth year] (74 y.o. M) Treating RN: Cornell Barman Primary Care Physician: Ria Bush Other Clinician: Referring Physician: Ria Bush Treating Physician/Extender:  Tito Dine in Treatment: 4 Education Assessment Education Provided To: Patient Education Topics Provided Venous: Handouts: Controlling Swelling with Multilayered Compression Wraps Methods: Demonstration, Explain/Verbal Responses: State content correctly Electronic Signature(s) Signed: 05/18/2020 12:58:54 PM By: Gretta Cool, BSN, RN, CWS, Kim RN, BSN Entered By: Gretta Cool, BSN, RN, CWS, Kim on 05/16/2020 14:04:09 [name] ([id number]) -------------------------------------------------------------------------------- Wound Assessment Details Patient Name: [name] Date of Service: 05/16/2020 1:15 PM Medical Record Number: [id number] Patient Account Number: [id number] Date of Birth/Sex: 03-15-1946 (74 y.o. M) Treating RN: Army Melia Primary Care Sincere Liuzzi: Ria Bush Other Clinician: Referring Soul Deveney: Ria Bush Treating Taysom Glymph/Extender: Tito Dine in Treatment: 4 Wound Status Wound Number: 4 Primary 2nd degree Burn Etiology: Wound Location: Left, Distal Lower Leg Wound Open Wounding Event: Thermal Burn Status: Date Acquired: 04/12/2020 Comorbid Sleep Apnea, Arrhythmia, Hypertension, Type II Weeks Of Treatment: 4 History: Diabetes, Osteoarthritis, Neuropathy, Received Radiation Clustered Wound: No Photos Wound Measurements Length: (cm) 7.5 Width: (cm) 5.5 Depth: (cm) 0.1 Area: (cm) 32.398 Volume: (cm) 3.24 % Reduction in Area: 65.4% % Reduction in Volume: 65.4% Epithelialization: Small (1-33%) Wound Description Classification: Full Thickness Without Exposed Support Structu Wound Margin: Flat and Intact Exudate Amount: Medium Exudate Type: Serous Exudate Color: amber res Foul Odor After Cleansing: No Slough/Fibrino Yes Wound Bed Granulation Amount: Large (67-100%) Exposed Structure Granulation Quality: Pink Fascia Exposed: No Necrotic Amount: Small (1-33%) Fat Layer (Subcutaneous Tissue) Exposed: Yes Necrotic Quality:  Adherent Slough Tendon Exposed: No Muscle Exposed: No Joint Exposed: No Bone Exposed: No Treatment Notes Wound #4 (Left, Distal Lower Leg) Notes iodoflex, ABD, 3 layer Electronic Signature(s) Signed: 05/16/2020 2:58:20 PM By: Anselm Lis ([id number]) Entered By: Army Melia on 05/16/2020 13:38:58 [name] ([id number]) -------------------------------------------------------------------------------- Vitals Details Patient Name: [name] Date of Service: 05/16/2020 1:15 PM Medical Record Number: [id number] Patient Account Number: [id number] Date of Birth/Sex: [birth year] (74 y.o. M) Treating RN: Cornell Barman Primary Care Kalanie Fewell: Ria Bush Other Clinician: Referring Dennys Guin: Ria Bush Treating Madalene Mickler/Extender: Tito Dine in Treatment: 4 Vital Signs Time Taken: 13:25 Temperature (F): 98.0 Height (in): 72 Pulse (bpm): 48 Weight (lbs): 219 Respiratory Rate (breaths/min): 18 Body Mass Index (BMI): 29.7 Blood Pressure (mmHg): 126/64 Reference Range: 80 - 120 mg / dl Electronic Signature(s) Signed: 05/17/2020 11:39:41 AM By: Lorine Bears RCP, RRT, CHT Entered By: Becky Sax, Amado Nash on 05/16/2020 13:29:48

## 2020-05-21 ENCOUNTER — Other Ambulatory Visit: Payer: Self-pay

## 2020-05-21 ENCOUNTER — Telehealth: Payer: Self-pay | Admitting: *Deleted

## 2020-05-21 DIAGNOSIS — G629 Polyneuropathy, unspecified: Secondary | ICD-10-CM | POA: Insufficient documentation

## 2020-05-21 DIAGNOSIS — E11622 Type 2 diabetes mellitus with other skin ulcer: Secondary | ICD-10-CM | POA: Diagnosis not present

## 2020-05-21 LAB — PROTEIN ELECTROPHORESIS, SERUM, WITH REFLEX
Albumin ELP: 3.3 g/dL — ABNORMAL LOW (ref 3.8–4.8)
Alpha 1: 0.4 g/dL — ABNORMAL HIGH (ref 0.2–0.3)
Alpha 2: 0.9 g/dL (ref 0.5–0.9)
Beta 2: 1 g/dL — ABNORMAL HIGH (ref 0.2–0.5)
Beta Globulin: 0.5 g/dL (ref 0.4–0.6)
Gamma Globulin: 0.5 g/dL — ABNORMAL LOW (ref 0.8–1.7)
Total Protein: 6.5 g/dL (ref 6.1–8.1)

## 2020-05-21 NOTE — Assessment & Plan Note (Signed)
Appreciate wound clinic care.  

## 2020-05-21 NOTE — Telephone Encounter (Signed)
Linda with [facility] left a voicemail stating that they need another statement ruling out why patient can not use a scooter. Vaughan Basta stated that they have faxed that question back to you. Vaughan Basta also stated that every time you open the patient's notes Medicare knows it. Vaughan Basta stated that when you open a note you have to go in and underline the change and sign and date the change. Vaughan Basta stated that this needs to be done on the note opened on 04/30/20, 05/21/20 and when it is opened to answer the last question. Vaughan Basta stated that Medicare knows that there have been changes made to each note when it is opened. Vaughan Basta stated when calling back or with any questions to use reference [phone].Marland Kitchen

## 2020-05-21 NOTE — Assessment & Plan Note (Addendum)
He does have evidence of this which contributes to imbalance. Anticipate component of diabetic neuropathy. No significant alcohol use, no h/o B12 deficiency. Check SPEP today, continue gabapentin 600mg  bid (TID night time dose caused dry mouth), refer to neurology for further evaluation.

## 2020-05-21 NOTE — Progress Notes (Signed)
[name], [name] ([id number]) Visit Report for 05/21/2020 Arrival Information Details Patient Name: [name] Date of Service: 05/21/2020 11:15 AM Medical Record Number: [id number] Patient Account Number: [id number] Date of Birth/Sex: 30-Dec-1945 (74 y.o. M) Treating RN: Montey Hora Primary Care Avenir Lozinski: Ria Bush Other Clinician: Referring Jerzey Komperda: Ria Bush Treating Grasiela Jonsson/Extender: Melburn Hake, HOYT Weeks in Treatment: 4 Visit Information History Since Last Visit Added or deleted any medications: No Patient Arrived: Cane Any new allergies or adverse reactions: No Arrival Time: 11:34 Had a fall or experienced change in No Accompanied By: spouse activities of daily living that may affect Transfer Assistance: None risk of falls: Patient Identification Verified: Yes Signs or symptoms of abuse/neglect since last visito No Secondary Verification Process Completed: Yes Hospitalized since last visit: No Implantable device outside of the clinic excluding No cellular tissue based products placed in the center since last visit: Has Dressing in Place as Prescribed: Yes Has Compression in Place as Prescribed: Yes Pain Present Now: Yes Electronic Signature(s) Signed: 05/21/2020 12:43:42 PM By: Montey Hora Entered By: Montey Hora on 05/21/2020 11:34:40 [name] ([id number]) -------------------------------------------------------------------------------- Compression Therapy Details Patient Name: [name] Date of Service: 05/21/2020 11:15 AM Medical Record Number: [id number] Patient Account Number: [id number] Date of Birth/Sex: 1946/10/14 (74 y.o. M) Treating RN: Montey Hora Primary Care Jaynie Hitch: Ria Bush Other Clinician: Referring Refugio Mcconico: Ria Bush Treating Ayeisha Lindenberger/Extender: Melburn Hake, HOYT Weeks in Treatment: 4 Compression Therapy Performed for Wound Assessment: Wound #4 Left,Distal Lower Leg Performed By: Clinician  Montey Hora, RN Compression Type: Three Layer Electronic Signature(s) Signed: 05/21/2020 12:43:42 PM By: Montey Hora Entered By: Montey Hora on 05/21/2020 11:48:33 [name] ([id number]) -------------------------------------------------------------------------------- Encounter Discharge Information Details Patient Name: [name] Date of Service: 05/21/2020 11:15 AM Medical Record Number: [id number] Patient Account Number: [id number] Date of Birth/Sex: 07/12/1946 (74 y.o. M) Treating RN: Montey Hora Primary Care Arelis Neumeier: Ria Bush Other Clinician: Referring Krislynn Gronau: Ria Bush Treating Renada Cronin/Extender: Melburn Hake, HOYT Weeks in Treatment: 4 Encounter Discharge Information Items Discharge Condition: Stable Ambulatory Status: Cane Discharge Destination: Home Transportation: Private Auto Accompanied By: wife Schedule Follow-up Appointment: Yes Clinical Summary of Care: Electronic Signature(s) Signed: 05/21/2020 12:43:42 PM By: Montey Hora Entered By: Montey Hora on 05/21/2020 11:49:03 [name] ([id number]) -------------------------------------------------------------------------------- Wound Assessment Details Patient Name: [name] Date of Service: 05/21/2020 11:15 AM Medical Record Number: [id number] Patient Account Number: [id number] Date of Birth/Sex: May 15, 1946 (74 y.o. M) Treating RN: Montey Hora Primary Care Reynolds Kittel: Ria Bush Other Clinician: Referring Deston Bilyeu: Ria Bush Treating Cam Dauphin/Extender: STONE III, HOYT Weeks in Treatment: 4 Wound Status Wound Number: 4 Primary 2nd degree Burn Etiology: Wound Location: Left, Distal Lower Leg Wound Open Wounding Event: Thermal Burn Status: Date Acquired: 04/12/2020 Comorbid Sleep Apnea, Arrhythmia, Hypertension, Type II Weeks Of Treatment: 4 History: Diabetes, Osteoarthritis, Neuropathy, Received Radiation Clustered Wound: No Photos Wound  Measurements Length: (cm) 6.9 Width: (cm) 5.4 Depth: (cm) 0.1 Area: (cm) 29.264 Volume: (cm) 2.926 % Reduction in Area: 68.7% % Reduction in Volume: 68.7% Epithelialization: Small (1-33%) Tunneling: No Undermining: No Wound Description Classification: Full Thickness Without Exposed Support Structu Wound Margin: Flat and Intact Exudate Amount: Medium Exudate Type: Serous Exudate Color: amber res Foul Odor After Cleansing: No Slough/Fibrino Yes Wound Bed Granulation Amount: Medium (34-66%) Exposed Structure Granulation Quality: Pink Fascia Exposed: No Necrotic Amount: Medium (34-66%) Fat Layer (Subcutaneous Tissue) Exposed: Yes Necrotic Quality: Adherent Slough Tendon Exposed: No Muscle Exposed: No Joint Exposed: No Bone Exposed: No Treatment Notes Wound #4 (Left,  Distal Lower Leg) Notes iodoflex, ABD, 3 layer Electronic Signature(s) Signed: 05/21/2020 12:43:42 PM By: Darryl Lent ([id number]) Entered By: Montey Hora on 05/21/2020 11:35:52

## 2020-05-21 NOTE — Assessment & Plan Note (Signed)
Recent head CT reassuring. Neuropathy contributes to unsteadiness/imbalance. Consider PT fall prevention/balance training program. Will refer to neurology for further evaluation. Pending approval for power wheelchair through [facility].

## 2020-05-23 ENCOUNTER — Encounter: Payer: Medicare Other | Admitting: Internal Medicine

## 2020-05-23 ENCOUNTER — Other Ambulatory Visit: Payer: Self-pay

## 2020-05-23 DIAGNOSIS — E1142 Type 2 diabetes mellitus with diabetic polyneuropathy: Secondary | ICD-10-CM | POA: Diagnosis not present

## 2020-05-23 DIAGNOSIS — T24232D Burn of second degree of left lower leg, subsequent encounter: Secondary | ICD-10-CM | POA: Diagnosis not present

## 2020-05-23 DIAGNOSIS — L97111 Non-pressure chronic ulcer of right thigh limited to breakdown of skin: Secondary | ICD-10-CM | POA: Diagnosis not present

## 2020-05-23 DIAGNOSIS — T24232A Burn of second degree of left lower leg, initial encounter: Secondary | ICD-10-CM | POA: Diagnosis not present

## 2020-05-23 DIAGNOSIS — E11622 Type 2 diabetes mellitus with other skin ulcer: Secondary | ICD-10-CM | POA: Diagnosis not present

## 2020-05-23 DIAGNOSIS — L97821 Non-pressure chronic ulcer of other part of left lower leg limited to breakdown of skin: Secondary | ICD-10-CM | POA: Diagnosis not present
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
## 2020-05-23 NOTE — Progress Notes (Signed)
KAITLYN, SKOWRON ([id number]) Visit Report for 05/23/2020 Arrival Information Details Patient Name: [name], [name] Date of Service: 05/23/2020 3:15 PM Medical Record Number: [id number] Patient Account Number: [id number] Date of Birth/Sex: [birth year] (74 y.o. M) Treating RN: Cornell Barman Primary Care Cailynn Bodnar: Ria Bush Other Clinician: Referring Shauniece Kwan: Ria Bush Treating Aryanne Gilleland/Extender: Tito Dine in Treatment: 5 Visit Information History Since Last Visit Added or deleted any medications: No Patient Arrived: Cane Any new allergies or adverse reactions: No Arrival Time: 15:26 Had a fall or experienced change in No Accompanied By: wife activities of daily living that may affect Transfer Assistance: None risk of falls: Patient Identification Verified: Yes Signs or symptoms of abuse/neglect since last visito No Secondary Verification Process Completed: Yes Hospitalized since last visit: No Implantable device outside of the clinic excluding No cellular tissue based products placed in the center since last visit: Has Dressing in Place as Prescribed: Yes Has Compression in Place as Prescribed: Yes Pain Present Now: No Electronic Signature(s) Signed: 05/23/2020 4:31:33 PM By: Lorine Bears RCP, RRT, CHT Entered By: Lorine Bears on 05/23/2020 15:26:57 [name] ([id number]) -------------------------------------------------------------------------------- Compression Therapy Details Patient Name: [name] Date of Service: 05/23/2020 3:15 PM Medical Record Number: [id number] Patient Account Number: [id number] Date of Birth/Sex: 1946-12-01 (74 y.o. M) Treating RN: Cornell Barman Primary Care Layonna Dobie: Ria Bush Other Clinician: Referring Aamari Strawderman: Ria Bush Treating Kage Willmann/Extender: Tito Dine in Treatment: 5 Compression Therapy Performed for Wound Assessment: Wound #4 Left,Distal Lower  Leg Performed By: Clinician Cornell Barman, RN Compression Type: Three Layer Post Procedure Diagnosis Same as Pre-procedure Notes Patient tolerating wrap well. Electronic Signature(s) Signed: 05/23/2020 5:04:05 PM By: Gretta Cool, BSN, RN, CWS, Kim RN, BSN Entered By: Gretta Cool, BSN, RN, CWS, Kim on 05/23/2020 15:44:09 [name] ([id number]) -------------------------------------------------------------------------------- Encounter Discharge Information Details Patient Name: [name] Date of Service: 05/23/2020 3:15 PM Medical Record Number: [id number] Patient Account Number: [id number] Date of Birth/Sex: 1946/04/26 (74 y.o. M) Treating RN: Cornell Barman Primary Care Zhania Shaheen: Ria Bush Other Clinician: Referring Alik Mawson: Ria Bush Treating Anicia Leuthold/Extender: Tito Dine in Treatment: 5 Encounter Discharge Information Items Discharge Condition: Stable Ambulatory Status: Ambulatory Discharge Destination: Home Transportation: Private Auto Accompanied By: wife Schedule Follow-up Appointment: Yes Clinical Summary of Care: Electronic Signature(s) Signed: 05/23/2020 5:04:05 PM By: Gretta Cool, BSN, RN, CWS, Kim RN, BSN Entered By: Gretta Cool, BSN, RN, CWS, Kim on 05/23/2020 15:46:06 [name] ([id number]) -------------------------------------------------------------------------------- Lower Extremity Assessment Details Patient Name: [name] Date of Service: 05/23/2020 3:15 PM Medical Record Number: [id number] Patient Account Number: [id number] Date of Birth/Sex: 02-18-1946 (74 y.o. M) Treating RN: Montey Hora Primary Care Marciana Uplinger: Ria Bush Other Clinician: Referring Arin Peral: Ria Bush Treating Gaither Biehn/Extender: Tito Dine in Treatment: 5 Edema Assessment Assessed: [Left: No] [Right: No] Edema: [Left: Ye] [Right: s] Calf Left: Right: Point of Measurement: 34 cm From Medial Instep 33 cm cm Ankle Left: Right: Point of  Measurement: 10 cm From Medial Instep 24.4 cm cm Vascular Assessment Pulses: Dorsalis Pedis Palpable: [Left:Yes] Electronic Signature(s) Signed: 05/23/2020 4:35:33 PM By: Montey Hora Entered By: Montey Hora on 05/23/2020 15:31:42 [name] ([id number]) -------------------------------------------------------------------------------- Multi Wound Chart Details Patient Name: [name] Date of Service: 05/23/2020 3:15 PM Medical Record Number: [id number] Patient Account Number: [id number] Date of Birth/Sex: 05/08/1946 (74 y.o. M) Treating RN: Cornell Barman Primary Care Karuna Balducci: Ria Bush Other Clinician: Referring Kate Larock: Ria Bush Treating Bethania Schlotzhauer/Extender: Ricard Dillon Weeks in Treatment: 5 Vital Signs  Height(in): 72 Pulse(bpm): 51 Weight(lbs): 219 Blood Pressure(mmHg): 119/53 Body Mass Index(BMI): 30 Temperature(F): 98.2 Respiratory Rate(breaths/min): 18 Photos: [N/A:N/A] Wound Location: Left, Distal Lower Leg N/A N/A Wounding Event: Thermal Burn N/A N/A Primary Etiology: 2nd degree Burn N/A N/A Comorbid History: Sleep Apnea, Arrhythmia, N/A N/A Hypertension, Type II Diabetes, Osteoarthritis, Neuropathy, Received Radiation Date Acquired: 04/12/2020 N/A N/A Weeks of Treatment: 5 N/A N/A Wound Status: Open N/A N/A Measurements L x W x D (cm) 5.2x4.1x0.1 N/A N/A Area (cm) : 16.745 N/A N/A Volume (cm) : 1.674 N/A N/A % Reduction in Area: 82.10% N/A N/A % Reduction in Volume: 82.10% N/A N/A Classification: Full Thickness Without Exposed N/A N/A Support Structures Exudate Amount: Medium N/A N/A Exudate Type: Serous N/A N/A Exudate Color: amber N/A N/A Wound Margin: Flat and Intact N/A N/A Granulation Amount: Large (67-100%) N/A N/A Granulation Quality: Pink N/A N/A Necrotic Amount: Small (1-33%) N/A N/A Exposed Structures: Fat Layer (Subcutaneous Tissue) N/A N/A Exposed: Yes Fascia: No Tendon: No Muscle: No Joint: No Bone:  No Epithelialization: Medium (34-66%) N/A N/A Procedures Performed: Compression Therapy N/A N/A Treatment Notes Wound #4 (Left, Distal Lower Leg) Notes Hydrofera Blue, ABD, 3 layer [name] ([id number]) Electronic Signature(s) Signed: 05/23/2020 4:26:25 PM By: Linton Ham MD Entered By: Linton Ham on 05/23/2020 15:47:46 [name] ([id number]) -------------------------------------------------------------------------------- Multi-Disciplinary Care Plan Details Patient Name: [name] Date of Service: 05/23/2020 3:15 PM Medical Record Number: [id number] Patient Account Number: [id number] Date of Birth/Sex: August 10, 1946 (74 y.o. M) Treating RN: Cornell Barman Primary Care Delrae Hagey: Ria Bush Other Clinician: Referring Darice Vicario: Ria Bush Treating Eyal Greenhaw/Extender: Tito Dine in Treatment: 5 Active Inactive Necrotic Tissue Nursing Diagnoses: Impaired tissue integrity related to necrotic/devitalized tissue Goals: Necrotic/devitalized tissue will be minimized in the wound bed Date Initiated: 04/18/2020 Target Resolution Date: 05/02/2020 Goal Status: Active Interventions: Assess patient pain level pre-, during and post procedure and prior to discharge Notes: Orientation to the Wound Care Program Nursing Diagnoses: Knowledge deficit related to the wound healing center program Goals: Patient/caregiver will verbalize understanding of the [place] Date Initiated: 04/18/2020 Target Resolution Date: 05/02/2020 Goal Status: Active Interventions: Provide education on orientation to the wound center Notes: Pain, Acute or Chronic Nursing Diagnoses: Pain, acute or chronic: actual or potential Potential alteration in comfort, pain Goals: Patient/caregiver will verbalize adequate pain control between visits Date Initiated: 04/18/2020 Target Resolution Date: 05/02/2020 Goal Status: Active Interventions: Assess comfort goal  upon admission Treatment Activities: Refer to pain specialist or other pain support program : 04/18/2020 Notes: Wound/Skin Impairment Nursing Diagnoses: Impaired tissue integrity Goals: [name] ([id number]) Ulcer/skin breakdown will have a volume reduction of 30% by week 4 Date Initiated: 04/18/2020 Target Resolution Date: 05/19/2020 Goal Status: Active Interventions: Assess ulceration(s) every visit Treatment Activities: Skin care regimen initiated : 04/18/2020 Topical wound management initiated : 04/18/2020 Notes: Electronic Signature(s) Signed: 05/23/2020 5:04:05 PM By: Gretta Cool, BSN, RN, CWS, Kim RN, BSN Entered By: Gretta Cool, BSN, RN, CWS, Kim on 05/23/2020 15:43:12 [name] ([id number]) -------------------------------------------------------------------------------- Pain Assessment Details Patient Name: [name] Date of Service: 05/23/2020 3:15 PM Medical Record Number: [id number] Patient Account Number: [id number] Date of Birth/Sex: [birth year] (74 y.o. M) Treating RN: Montey Hora Primary Care Maryiah Olvey: Ria Bush Other Clinician: Referring Jennice Renegar: Ria Bush Treating Mischa Pollard/Extender: Tito Dine in Treatment: 5 Active Problems Location of Pain Severity and Description of Pain Patient Has Paino No Site Locations Pain Management and Medication Current Pain Management: Electronic Signature(s) Signed: 05/23/2020 4:35:33  PM By: Montey Hora Entered By: Montey Hora on 05/23/2020 15:30:50 [name] ([id number]) -------------------------------------------------------------------------------- Patient/Caregiver Education Details Patient Name: [name] Date of Service: 05/23/2020 3:15 PM Medical Record Number: [id number] Patient Account Number: [id number] Date of Birth/Gender: [birth year] (74 y.o. M) Treating RN: Cornell Barman Primary Care Physician: Ria Bush Other Clinician: Referring Physician: Ria Bush Treating Physician/Extender: Tito Dine in Treatment: 5 Education Assessment Education Provided To: Patient Education Topics Provided Wound/Skin Impairment: Handouts: Caring for Your Ulcer Methods: Demonstration, Explain/Verbal Responses: State content correctly Electronic Signature(s) Signed: 05/23/2020 5:04:05 PM By: Gretta Cool, BSN, RN, CWS, Kim RN, BSN Entered By: Gretta Cool, BSN, RN, CWS, Kim on 05/23/2020 15:45:15 [name] ([id number]) -------------------------------------------------------------------------------- Wound Assessment Details Patient Name: [name] Date of Service: 05/23/2020 3:15 PM Medical Record Number: [id number] Patient Account Number: [id number] Date of Birth/Sex: [birth year] (74 y.o. M) Treating RN: Montey Hora Primary Care Shiara Mcgough: Ria Bush Other Clinician: Referring Aleyza Salmi: Ria Bush Treating Sholonda Jobst/Extender: Tito Dine in Treatment: 5 Wound Status Wound Number: 4 Primary 2nd degree Burn Etiology: Wound Location: Left, Distal Lower Leg Wound Open Wounding Event: Thermal Burn Status: Date Acquired: 04/12/2020 Comorbid Sleep Apnea, Arrhythmia, Hypertension, Type II Weeks Of Treatment: 5 History: Diabetes, Osteoarthritis, Neuropathy, Received Radiation Clustered Wound: No Photos Wound Measurements Length: (cm) 5.2 Width: (cm) 4.1 Depth: (cm) 0.1 Area: (cm) 16.745 Volume: (cm) 1.674 % Reduction in Area: 82.1% % Reduction in Volume: 82.1% Epithelialization: Medium (34-66%) Tunneling: No Undermining: No Wound Description Classification: Full Thickness Without Exposed Support Structu Wound Margin: Flat and Intact Exudate Amount: Medium Exudate Type: Serous Exudate Color: amber res Foul Odor After Cleansing: No Slough/Fibrino Yes Wound Bed Granulation Amount: Large (67-100%) Exposed Structure Granulation Quality: Pink Fascia Exposed: No Necrotic Amount: Small (1-33%) Fat Layer  (Subcutaneous Tissue) Exposed: Yes Necrotic Quality: Adherent Slough Tendon Exposed: No Muscle Exposed: No Joint Exposed: No Bone Exposed: No Treatment Notes Wound #4 (Left, Distal Lower Leg) Notes Hydrofera Blue, ABD, 3 layer Electronic Signature(s) Signed: 05/23/2020 4:35:33 PM By: Darryl Lent ([id number]) Entered By: Montey Hora on 05/23/2020 15:36:37 [name] ([id number]) -------------------------------------------------------------------------------- Vitals Details Patient Name: [name] Date of Service: 05/23/2020 3:15 PM Medical Record Number: [id number] Patient Account Number: [id number] Date of Birth/Sex: 1946/01/12 (74 y.o. M) Treating RN: Cornell Barman Primary Care Coury Grieger: Ria Bush Other Clinician: Referring Synai Prettyman: Ria Bush Treating Dayanne Yiu/Extender: Tito Dine in Treatment: 5 Vital Signs Time Taken: 15:25 Temperature (F): 98.2 Height (in): 72 Pulse (bpm): 51 Weight (lbs): 219 Respiratory Rate (breaths/min): 18 Body Mass Index (BMI): 29.7 Blood Pressure (mmHg): 119/53 Reference Range: 80 - 120 mg / dl Electronic Signature(s) Signed: 05/23/2020 4:31:33 PM By: Lorine Bears RCP, RRT, CHT Entered By: Lorine Bears on 05/23/2020 15:27:37

## 2020-05-24 NOTE — Telephone Encounter (Addendum)
Faxed form.    Left message on vm per dpr for pt's wife, [name] (on dpr), relaying Dr. Synthia Innocent message.

## 2020-05-24 NOTE — Telephone Encounter (Signed)
I've addended note from 04/30/2020 per their request and placed in Lisa's box. If continued denial then their insurance will likely not cover power wheelchair and they may need to check into other options for power scooter (ie different [facility] may be another option to provide a scooter under patient's insurance. [facility] phone number is [phone])

## 2020-05-25 ENCOUNTER — Other Ambulatory Visit: Payer: Self-pay

## 2020-05-25 DIAGNOSIS — E11622 Type 2 diabetes mellitus with other skin ulcer: Secondary | ICD-10-CM | POA: Diagnosis not present

## 2020-05-28 ENCOUNTER — Telehealth: Payer: Self-pay

## 2020-05-28 ENCOUNTER — Other Ambulatory Visit: Payer: Self-pay

## 2020-05-28 ENCOUNTER — Other Ambulatory Visit: Payer: Self-pay | Admitting: Family Medicine

## 2020-05-28 DIAGNOSIS — G894 Chronic pain syndrome: Secondary | ICD-10-CM

## 2020-05-28 DIAGNOSIS — E11622 Type 2 diabetes mellitus with other skin ulcer: Secondary | ICD-10-CM | POA: Diagnosis not present

## 2020-05-28 MED ORDER — HYDROCODONE-ACETAMINOPHEN 10-325 MG PO TABS: 1.0000 | ORAL_TABLET | Freq: Four times a day (QID) | ORAL | 0 refills | Status: DC | PRN

## 2020-05-28 NOTE — Telephone Encounter (Signed)
ERx 

## 2020-05-28 NOTE — Telephone Encounter (Signed)
[name] with [employer] left v/m that 05/28/20 [employer] faxed a delivery document called a standard written order with what equipment pt is to receive and the cost of that equipment. [name] request Dr Danise Mina to sign and date and fax back to [employer] at fax # [phone] using ref # [id number].

## 2020-05-29 ENCOUNTER — Telehealth: Payer: Self-pay

## 2020-05-29 NOTE — Telephone Encounter (Signed)
Filled and in my outbox

## 2020-05-29 NOTE — Telephone Encounter (Signed)
Received faxed Standard Written Order for power chair accessories.    Placed order in Dr. Synthia Innocent box.

## 2020-05-29 NOTE — Progress Notes (Signed)
CAINEN, BURNHAM ([id number]) Visit Report for 05/25/2020 Arrival Information Details Patient Name: CARSTON, RIEDL Date of Service: 05/25/2020 2:30 PM Medical Record Number: [id number] Patient Account Number: [id number] Date of Birth/Sex: May 06, 1946 (74 y.o. M) Treating RN: Cornell Barman Primary Care Libbi Towner: Ria Bush Other Clinician: Referring Jasmaine Rochel: Ria Bush Treating Kashonda Sarkisyan/Extender: Melburn Hake, HOYT Weeks in Treatment: 5 Visit Information History Since Last Visit Pain Present Now: Yes Patient Arrived: Ambulatory Arrival Time: 14:48 Accompanied By: wife Transfer Assistance: None Patient Identification Verified: Yes Secondary Verification Process Completed: Yes Electronic Signature(s) Signed: 05/28/2020 4:54:26 PM By: Gretta Cool, BSN, RN, CWS, Kim RN, BSN Entered By: Gretta Cool, BSN, RN, CWS, Kim on 05/25/2020 14:49:08 [name] ([id number]) -------------------------------------------------------------------------------- Compression Therapy Details Patient Name: [name] Date of Service: 05/25/2020 2:30 PM Medical Record Number: [id number] Patient Account Number: [id number] Date of Birth/Sex: 05-09-1946 (74 y.o. M) Treating RN: Cornell Barman Primary Care Barabara Motz: Ria Bush Other Clinician: Referring Angila Wombles: Ria Bush Treating Elwyn Lowden/Extender: Melburn Hake, HOYT Weeks in Treatment: 5 Compression Therapy Performed for Wound Assessment: Wound #4 Left,Distal Lower Leg Performed By: Clinician Cornell Barman, RN Compression Type: Three Layer Notes Patient is tolerating wraps Electronic Signature(s) Signed: 05/28/2020 4:54:26 PM By: Gretta Cool, BSN, RN, CWS, Kim RN, BSN Entered By: Gretta Cool, BSN, RN, CWS, Kim on 05/25/2020 14:57:30 [name] ([id number]) -------------------------------------------------------------------------------- Encounter Discharge Information Details Patient Name: [name] Date of Service: 05/25/2020 2:30 PM Medical Record  Number: [id number] Patient Account Number: [id number] Date of Birth/Sex: [birth year] (74 y.o. M) Treating RN: Cornell Barman Primary Care Silvano Garofano: Ria Bush Other Clinician: Referring Caedon Bond: Ria Bush Treating Sarya Linenberger/Extender: Melburn Hake, HOYT Weeks in Treatment: 5 Encounter Discharge Information Items Discharge Condition: Stable Ambulatory Status: Wheelchair Discharge Destination: Home Transportation: Private Auto Accompanied By: self Schedule Follow-up Appointment: Yes Clinical Summary of Care: Electronic Signature(s) Signed: 05/28/2020 4:54:26 PM By: Gretta Cool, BSN, RN, CWS, Kim RN, BSN Entered By: Gretta Cool, BSN, RN, CWS, Kim on 05/25/2020 14:59:35 [name] ([id number]) -------------------------------------------------------------------------------- Wound Assessment Details Patient Name: [name] Date of Service: 05/25/2020 2:30 PM Medical Record Number: [id number] Patient Account Number: [id number] Date of Birth/Sex: 13-Jul-1946 (74 y.o. M) Treating RN: Cornell Barman Primary Care Hollie Bartus: Ria Bush Other Clinician: Referring Ferrah Panagopoulos: Ria Bush Treating Adalea Handler/Extender: STONE III, HOYT Weeks in Treatment: 5 Wound Status Wound Number: 4 Primary Etiology: 2nd degree Burn Wound Location: Left, Distal Lower Leg Wound Status: Open Wounding Event: Thermal Burn Date Acquired: 04/12/2020 Weeks Of Treatment: 5 Clustered Wound: No Wound Measurements Length: (cm) 5.2 Width: (cm) 4.1 Depth: (cm) 0.1 Area: (cm) 16.745 Volume: (cm) 1.674 % Reduction in Area: 82.1% % Reduction in Volume: 82.1% Wound Description Classification: Full Thickness Without Exposed Support Structure s Electronic Signature(s) Signed: 05/28/2020 4:54:26 PM By: Gretta Cool, BSN, RN, CWS, Kim RN, BSN Entered By: Gretta Cool, BSN, RN, CWS, Kim on 05/25/2020 14:49:26

## 2020-05-29 NOTE — Telephone Encounter (Signed)
Noted  

## 2020-05-29 NOTE — Progress Notes (Signed)
[name], [name] ([id number]) Visit Report for 05/28/2020 Arrival Information Details Patient Name: [name], [name] Date of Service: 05/28/2020 3:30 PM Medical Record Number: [id number] Patient Account Number: [id number] Date of Birth/Sex: 02-11-1946 (74 y.o. M) Treating RN: Cornell Barman Primary Care Lakelyn Straus: Ria Bush Other Clinician: Referring Slayden Mennenga: Ria Bush Treating Narjis Mira/Extender: Melburn Hake, HOYT Weeks in Treatment: 5 Visit Information History Since Last Visit Added or deleted any medications: No Patient Arrived: Cane Any new allergies or adverse reactions: No Arrival Time: 15:42 Had a fall or experienced change in No Accompanied By: caregiver activities of daily living that may affect Transfer Assistance: None risk of falls: Patient Identification Verified: Yes Signs or symptoms of abuse/neglect since last visito No Secondary Verification Process Completed: Yes Hospitalized since last visit: No Implantable device outside of the clinic excluding No cellular tissue based products placed in the center since last visit: Has Dressing in Place as Prescribed: Yes Pain Present Now: No Electronic Signature(s) Signed: 05/28/2020 4:16:21 PM By: Sandre Kitty Entered By: Sandre Kitty on 05/28/2020 15:42:44 [name] ([id number]) -------------------------------------------------------------------------------- Compression Therapy Details Patient Name: [name] Date of Service: 05/28/2020 3:30 PM Medical Record Number: [id number] Patient Account Number: [id number] Date of Birth/Sex: [birth year] (74 y.o. M) Treating RN: Montey Hora Primary Care Dyanna Seiter: Ria Bush Other Clinician: Referring Clee Pandit: Ria Bush Treating Vivan Agostino/Extender: STONE III, HOYT Weeks in Treatment: 5 Compression Therapy Performed for Wound Assessment: Wound #4 Left,Distal Lower Leg Performed By: Clinician Montey Hora, RN Compression Type: Three  Layer Electronic Signature(s) Signed: 05/28/2020 4:30:49 PM By: Montey Hora Entered By: Montey Hora on 05/28/2020 16:30:49 [name] ([id number]) -------------------------------------------------------------------------------- Encounter Discharge Information Details Patient Name: [name] Date of Service: 05/28/2020 3:30 PM Medical Record Number: [id number] Patient Account Number: [id number] Date of Birth/Sex: [birth year] (74 y.o. M) Treating RN: Montey Hora Primary Care Sacred Roa: Ria Bush Other Clinician: Referring Nils Thor: Ria Bush Treating Ayriana Wix/Extender: Melburn Hake, HOYT Weeks in Treatment: 5 Encounter Discharge Information Items Discharge Condition: Stable Ambulatory Status: Ambulatory Discharge Destination: Home Transportation: Private Auto Accompanied By: self Schedule Follow-up Appointment: Yes Clinical Summary of Care: Electronic Signature(s) Signed: 05/28/2020 4:31:16 PM By: Montey Hora Entered By: Montey Hora on 05/28/2020 16:31:16 [name] ([id number]) -------------------------------------------------------------------------------- Wound Assessment Details Patient Name: [name] Date of Service: 05/28/2020 3:30 PM Medical Record Number: [id number] Patient Account Number: [id number] Date of Birth/Sex: Jul 05, 1946 (74 y.o. M) Treating RN: Cornell Barman Primary Care Christien Frankl: Ria Bush Other Clinician: Referring Mashanda Ishibashi: Ria Bush Treating Savonna Birchmeier/Extender: STONE III, HOYT Weeks in Treatment: 5 Wound Status Wound Number: 4 Primary 2nd degree Burn Etiology: Wound Location: Left, Distal Lower Leg Wound Open Wounding Event: Thermal Burn Status: Date Acquired: 04/12/2020 Comorbid Sleep Apnea, Arrhythmia, Hypertension, Type II Weeks Of Treatment: 5 History: Diabetes, Osteoarthritis, Neuropathy, Received Radiation Clustered Wound: No Photos Wound Measurements Length: (cm) 0.3 Width: (cm)  0.2 Depth: (cm) 0.1 Area: (cm) 0.047 Volume: (cm) 0.005 % Reduction in Area: 99.9% % Reduction in Volume: 99.9% Wound Description Classification: Full Thickness Without Exposed Support Structure s Treatment Notes Wound #4 (Left, Distal Lower Leg) Notes Hydrofera Blue, ABD, 3 layer Electronic Signature(s) Signed: 05/28/2020 4:16:21 PM By: Sandre Kitty Signed: 05/28/2020 4:54:26 PM By: Gretta Cool, BSN, RN, CWS, Kim RN, BSN Entered By: Sandre Kitty on 05/28/2020 15:43:42

## 2020-05-30 ENCOUNTER — Encounter: Payer: Medicare Other | Admitting: Internal Medicine

## 2020-05-30 ENCOUNTER — Other Ambulatory Visit: Payer: Self-pay

## 2020-05-30 DIAGNOSIS — E11622 Type 2 diabetes mellitus with other skin ulcer: Secondary | ICD-10-CM | POA: Diagnosis not present

## 2020-05-30 NOTE — Progress Notes (Signed)
[name], [name] ([id number]) Visit Report for 05/30/2020 Arrival Information Details Patient Name: [name] Date of Service: 05/30/2020 2:15 PM Medical Record Number: [id number] Patient Account Number: [id number] Date of Birth/Sex: [birth year] (74 y.o. M) Treating RN: Cornell Barman Primary Care Tri Chittick: Ria Bush Other Clinician: Referring Suzanne Kho: Ria Bush Treating Custer Pimenta/Extender: Tito Dine in Treatment: 6 Visit Information History Since Last Visit Added or deleted any medications: No Patient Arrived: Cane Any new allergies or adverse reactions: No Arrival Time: 14:27 Had a fall or experienced change in No Accompanied By: wife activities of daily living that may affect Transfer Assistance: None risk of falls: Patient Identification Verified: Yes Signs or symptoms of abuse/neglect since last visito No Secondary Verification Process Completed: Yes Hospitalized since last visit: No Implantable device outside of the clinic excluding No cellular tissue based products placed in the center since last visit: Has Dressing in Place as Prescribed: Yes Has Compression in Place as Prescribed: Yes Pain Present Now: No Electronic Signature(s) Signed: 05/30/2020 3:28:46 PM By: Lorine Bears RCP, RRT, CHT Entered By: Lorine Bears on 05/30/2020 14:29:03 [name] ([id number]) -------------------------------------------------------------------------------- Clinic Level of Care Assessment Details Patient Name: [name] Date of Service: 05/30/2020 2:15 PM Medical Record Number: [id number] Patient Account Number: [id number] Date of Birth/Sex: Mar 21, 1946 (74 y.o. M) Treating RN: Cornell Barman Primary Care Stevan Eberwein: Ria Bush Other Clinician: Referring Britten Parady: Ria Bush Treating Sahan Pen/Extender: Tito Dine in Treatment: 6 Clinic Level of Care Assessment Items TOOL 4 Quantity Score []  - Use  when only an EandM is performed on FOLLOW-UP visit 0 ASSESSMENTS - Nursing Assessment / Reassessment X - Reassessment of Co-morbidities (includes updates in patient status) 1 10 X- 1 5 Reassessment of Adherence to Treatment Plan ASSESSMENTS - Wound and Skin Assessment / Reassessment X - Simple Wound Assessment / Reassessment - one wound 1 5 []  - 0 Complex Wound Assessment / Reassessment - multiple wounds []  - 0 Dermatologic / Skin Assessment (not related to wound area) ASSESSMENTS - Focused Assessment []  - Circumferential Edema Measurements - multi extremities 0 []  - 0 Nutritional Assessment / Counseling / Intervention []  - 0 Lower Extremity Assessment (monofilament, tuning fork, pulses) []  - 0 Peripheral Arterial Disease Assessment (using hand held doppler) ASSESSMENTS - Ostomy and/or Continence Assessment and Care []  - Incontinence Assessment and Management 0 []  - 0 Ostomy Care Assessment and Management (repouching, etc.) PROCESS - Coordination of Care X - Simple Patient / Family Education for ongoing care 1 15 []  - 0 Complex (extensive) Patient / Family Education for ongoing care []  - 0 Staff obtains Programmer, systems, Records, Test Results / Process Orders []  - 0 Staff telephones HHA, Nursing Homes / Clarify orders / etc []  - 0 Routine Transfer to another Facility (non-emergent condition) []  - 0 Routine Hospital Admission (non-emergent condition) []  - 0 New Admissions / Biomedical engineer / Ordering NPWT, Apligraf, etc. []  - 0 Emergency Hospital Admission (emergent condition) X- 1 10 Simple Discharge Coordination []  - 0 Complex (extensive) Discharge Coordination PROCESS - Special Needs []  - Pediatric / Minor Patient Management 0 []  - 0 Isolation Patient Management []  - 0 Hearing / Language / Visual special needs []  - 0 Assessment of Community assistance (transportation, D/C planning, etc.) []  - 0 Additional assistance / Altered mentation []  - 0 Support  Surface(s) Assessment (bed, cushion, seat, etc.) INTERVENTIONS - Wound Cleansing / Measurement Moore, Jim Norris ([id number]) X- 1 5 Simple Wound Cleansing - one wound []  - 0  Complex Wound Cleansing - multiple wounds X- 1 5 Wound Imaging (photographs - any number of wounds) []  - 0 Wound Tracing (instead of photographs) X- 1 5 Simple Wound Measurement - one wound []  - 0 Complex Wound Measurement - multiple wounds INTERVENTIONS - Wound Dressings []  - Small Wound Dressing one or multiple wounds 0 []  - 0 Medium Wound Dressing one or multiple wounds []  - 0 Large Wound Dressing one or multiple wounds []  - 0 Application of Medications - topical []  - 0 Application of Medications - injection INTERVENTIONS - Miscellaneous []  - External ear exam 0 []  - 0 Specimen Collection (cultures, biopsies, blood, body fluids, etc.) []  - 0 Specimen(s) / Culture(s) sent or taken to Lab for analysis []  - 0 Patient Transfer (multiple staff / Civil Service fast streamer / Similar devices) []  - 0 Simple Staple / Suture removal (25 or less) []  - 0 Complex Staple / Suture removal (26 or more) []  - 0 Hypo / Hyperglycemic Management (close monitor of Blood Glucose) []  - 0 Ankle / Brachial Index (ABI) - do not check if billed separately X- 1 5 Vital Signs Has the patient been seen at the hospital within the last three years: Yes Total Score: 65 Level Of Care: New/Established - Level 2 Electronic Signature(s) Signed: 05/30/2020 3:55:21 PM By: Gretta Cool, BSN, RN, CWS, Kim RN, BSN Entered By: Gretta Cool, BSN, RN, CWS, Kim on 05/30/2020 14:52:20 [name] ([id number]) -------------------------------------------------------------------------------- Encounter Discharge Information Details Patient Name: [name] Date of Service: 05/30/2020 2:15 PM Medical Record Number: [id number] Patient Account Number: [id number] Date of Birth/Sex: 06/07/1946 (74 y.o. M) Treating RN: Cornell Barman Primary Care Novali Vollman: Ria Bush  Other Clinician: Referring Debroh Sieloff: Ria Bush Treating Fuller Makin/Extender: Tito Dine in Treatment: 6 Encounter Discharge Information Items Discharge Condition: Stable Ambulatory Status: Ambulatory Discharge Destination: Home Transportation: Private Auto Accompanied By: self Schedule Follow-up Appointment: No Clinical Summary of Care: Electronic Signature(s) Signed: 05/30/2020 3:55:21 PM By: Gretta Cool, BSN, RN, CWS, Kim RN, BSN Entered By: Gretta Cool, BSN, RN, CWS, Kim on 05/30/2020 14:53:06 [name] ([id number]) -------------------------------------------------------------------------------- Lower Extremity Assessment Details Patient Name: [name] Date of Service: 05/30/2020 2:15 PM Medical Record Number: [id number] Patient Account Number: [id number] Date of Birth/Sex: December 29, 1945 (74 y.o. M) Treating RN: Montey Hora Primary Care Hilman Kissling: Ria Bush Other Clinician: Referring Daziya Redmond: Ria Bush Treating Grier Czerwinski/Extender: Tito Dine in Treatment: 6 Edema Assessment Assessed: [Left: No] [Right: No] Edema: [Left: Ye] [Right: s] Calf Left: Right: Point of Measurement: 34 cm From Medial Instep 33.5 cm cm Ankle Left: Right: Point of Measurement: 10 cm From Medial Instep 24.5 cm cm Vascular Assessment Pulses: Dorsalis Pedis Palpable: [Left:Yes] Electronic Signature(s) Signed: 05/30/2020 3:45:40 PM By: Montey Hora Entered By: Montey Hora on 05/30/2020 14:39:26 [name] ([id number]) -------------------------------------------------------------------------------- Multi Wound Chart Details Patient Name: [name] Date of Service: 05/30/2020 2:15 PM Medical Record Number: [id number] Patient Account Number: [id number] Date of Birth/Sex: August 21, 1946 (74 y.o. M) Treating RN: Cornell Barman Primary Care Stefan Karen: Ria Bush Other Clinician: Referring Ela Moffat: Ria Bush Treating Suzy Kugel/Extender:  Tito Dine in Treatment: 6 Vital Signs Height(in): 72 Pulse(bpm): 38 Weight(lbs): 219 Blood Pressure(mmHg): 124/63 Body Mass Index(BMI): 30 Temperature(F): 97.7 Respiratory Rate(breaths/min): 18 Photos: [N/A:N/A] Wound Location: Left, Distal Lower Leg N/A N/A Wounding Event: Thermal Burn N/A N/A Primary Etiology: 2nd degree Burn N/A N/A Comorbid History: Sleep Apnea, Arrhythmia, N/A N/A Hypertension, Type II Diabetes, Osteoarthritis, Neuropathy, Received Radiation Date Acquired: 04/12/2020 N/A N/A Weeks of Treatment:  6 N/A N/A Wound Status: Healed - Epithelialized N/A N/A Measurements L x W x D (cm) 0x0x0 N/A N/A Area (cm) : 0 N/A N/A Volume (cm) : 0 N/A N/A % Reduction in Area: 100.00% N/A N/A % Reduction in Volume: 100.00% N/A N/A Classification: Full Thickness Without Exposed N/A N/A Support Structures Exudate Amount: Medium N/A N/A Exudate Type: Serous N/A N/A Exudate Color: amber N/A N/A Wound Margin: Flat and Intact N/A N/A Granulation Amount: Large (67-100%) N/A N/A Granulation Quality: Pink N/A N/A Necrotic Amount: None Present (0%) N/A N/A Exposed Structures: Fat Layer (Subcutaneous Tissue) N/A N/A Exposed: Yes Fascia: No Tendon: No Muscle: No Joint: No Bone: No Epithelialization: Large (67-100%) N/A N/A Treatment Notes Electronic Signature(s) Signed: 05/30/2020 3:45:53 PM By: Linton Ham MD Entered By: Linton Ham on 05/30/2020 14:54:49 [name] ([id number]) -------------------------------------------------------------------------------- Multi-Disciplinary Care Plan Details Patient Name: [name] Date of Service: 05/30/2020 2:15 PM Medical Record Number: [id number] Patient Account Number: [id number] Date of Birth/Sex: November 12, 1946 (74 y.o. M) Treating RN: Cornell Barman Primary Care Aljean Horiuchi: Ria Bush Other Clinician: Referring Kenidee Cregan: Ria Bush Treating Darcie Mellone/Extender: Tito Dine in  Treatment: 6 Active Inactive Electronic Signature(s) Signed: 05/30/2020 3:55:21 PM By: Gretta Cool, BSN, RN, CWS, Kim RN, BSN Entered By: Gretta Cool, BSN, RN, CWS, Kim on 05/30/2020 14:50:46 [name] ([id number]) -------------------------------------------------------------------------------- Pain Assessment Details Patient Name: [name] Date of Service: 05/30/2020 2:15 PM Medical Record Number: [id number] Patient Account Number: [id number] Date of Birth/Sex: 08/13/1946 (74 y.o. M) Treating RN: Montey Hora Primary Care Mikalia Fessel: Ria Bush Other Clinician: Referring Gates Jividen: Ria Bush Treating Codee Bloodworth/Extender: Tito Dine in Treatment: 6 Active Problems Location of Pain Severity and Description of Pain Patient Has Paino No Site Locations Pain Management and Medication Current Pain Management: Electronic Signature(s) Signed: 05/30/2020 3:45:40 PM By: Montey Hora Entered By: Montey Hora on 05/30/2020 14:39:35 [name] ([id number]) -------------------------------------------------------------------------------- Patient/Caregiver Education Details Patient Name: [name] Date of Service: 05/30/2020 2:15 PM Medical Record Number: [id number] Patient Account Number: [id number] Date of Birth/Gender: [birth year] (74 y.o. M) Treating RN: Cornell Barman Primary Care Physician: Ria Bush Other Clinician: Referring Physician: Ria Bush Treating Physician/Extender: Tito Dine in Treatment: 6 Education Assessment Education Provided To: Patient Education Topics Provided Wound/Skin Impairment: Handouts: Other: treatment complete Methods: Demonstration Responses: State content correctly Electronic Signature(s) Signed: 05/30/2020 3:55:21 PM By: Gretta Cool, BSN, RN, CWS, Kim RN, BSN Entered By: Gretta Cool, BSN, RN, CWS, Kim on 05/30/2020 14:52:50 [name]  ([id number]) -------------------------------------------------------------------------------- Wound Assessment Details Patient Name: [name] Date of Service: 05/30/2020 2:15 PM Medical Record Number: [id number] Patient Account Number: [id number] Date of Birth/Sex: [birth year] (74 y.o. M) Treating RN: Cornell Barman Primary Care Dorian Renfro: Ria Bush Other Clinician: Referring Corianne Buccellato: Ria Bush Treating Abenezer Odonell/Extender: Tito Dine in Treatment: 6 Wound Status Wound Number: 4 Primary 2nd degree Burn Etiology: Wound Location: Left, Distal Lower Leg Wound Healed - Epithelialized Wounding Event: Thermal Burn Status: Date Acquired: 04/12/2020 Comorbid Sleep Apnea, Arrhythmia, Hypertension, Type II Weeks Of Treatment: 6 History: Diabetes, Osteoarthritis, Neuropathy, Received Radiation Clustered Wound: No Photos Wound Measurements Length: (cm) 0 Width: (cm) 0 Depth: (cm) 0 Area: (cm) Volume: (cm) % Reduction in Area: 100% % Reduction in Volume: 100% Epithelialization: Large (67-100%) 0 Tunneling: No 0 Undermining: No Wound Description Classification: Full Thickness Without Exposed Support Structu Wound Margin: Flat and Intact Exudate Amount: Medium Exudate Type: Serous Exudate Color: amber res Foul Odor After Cleansing: No Slough/Fibrino No Wound Bed  Granulation Amount: Large (67-100%) Exposed Structure Granulation Quality: Pink Fascia Exposed: No Necrotic Amount: None Present (0%) Fat Layer (Subcutaneous Tissue) Exposed: Yes Tendon Exposed: No Muscle Exposed: No Joint Exposed: No Bone Exposed: No Electronic Signature(s) Signed: 05/30/2020 3:55:21 PM By: Gretta Cool, BSN, RN, CWS, Kim RN, BSN Entered By: Gretta Cool, BSN, RN, CWS, Kim on 05/30/2020 14:50:26 [name] ([id number]) -------------------------------------------------------------------------------- Vitals Details Patient Name: [name] Date of Service: 05/30/2020 2:15  PM Medical Record Number: [id number] Patient Account Number: [id number] Date of Birth/Sex: [birth year] (74 y.o. M) Treating RN: Cornell Barman Primary Care Kimila Papaleo: Ria Bush Other Clinician: Referring Liddy Deam: Ria Bush Treating Modell Fendrick/Extender: Tito Dine in Treatment: 6 Vital Signs Time Taken: 14:25 Temperature (F): 97.7 Height (in): 72 Pulse (bpm): 57 Weight (lbs): 219 Respiratory Rate (breaths/min): 18 Body Mass Index (BMI): 29.7 Blood Pressure (mmHg): 124/63 Reference Range: 80 - 120 mg / dl Electronic Signature(s) Signed: 05/30/2020 3:28:46 PM By: Lorine Bears RCP, RRT, CHT Entered By: Becky Sax, Amado Nash on 05/30/2020 14:29:27
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
## 2020-06-03 ENCOUNTER — Other Ambulatory Visit: Payer: Self-pay | Admitting: Family Medicine

## 2020-06-04 NOTE — Telephone Encounter (Signed)
Robaxin Last filled:  01/02/20, #180 Last OV:  05/16/20, weakness, imbalance Next OV:  none

## 2020-06-05 ENCOUNTER — Other Ambulatory Visit: Payer: Self-pay

## 2020-06-05 ENCOUNTER — Ambulatory Visit: Payer: Medicare Other

## 2020-06-05 DIAGNOSIS — E782 Mixed hyperlipidemia: Secondary | ICD-10-CM

## 2020-06-05 DIAGNOSIS — IMO0002 Reserved for concepts with insufficient information to code with codable children: Secondary | ICD-10-CM

## 2020-06-05 NOTE — Progress Notes (Signed)
CLARKE, PERETZ ([id number]) Visit Report for 04/18/2020 Allergy List Details Patient Name: [name], [name] Date of Service: 04/18/2020 3:00 PM Medical Record Number: [id number] Patient Account Number: [id number] Date of Birth/Sex: [birth year] (74 y.o. M) Treating RN: Cornell Barman Primary Care Provider: Ria Bush Other Clinician: Referring Provider: Ria Bush Treating Provider/Extender: Tito Dine in Treatment: 0 Allergies Active Allergies No Known Drug Allergies Allergy Notes Electronic Signature(s) Signed: 04/20/2020 8:22:26 AM By: Gretta Cool, BSN, RN, CWS, Kim RN, BSN Entered By: Gretta Cool, BSN, RN, CWS, Kim on 04/20/2020 17:49:44 [name] ([id number]) -------------------------------------------------------------------------------- Arrival Information Details Patient Name: [name] Date of Service: 04/18/2020 3:00 PM Medical Record Number: [id number] Patient Account Number: [id number] Date of Birth/Sex: [birth year] (73 y.o. M) Treating RN: Cornell Barman Primary Care Provider: Ria Bush Other Clinician: Referring Provider: Ria Bush Treating Provider/Extender: Tito Dine in Treatment: 0 Visit Information Patient Arrived: Wheel Chair Arrival Time: 15:24 Accompanied By: wife Transfer Assistance: None Patient Identification Verified: Yes Secondary Verification Process Completed: Yes History Since Last Visit Added or deleted any medications: No Any new allergies or adverse reactions: No Had a fall or experienced change in activities of daily living that may affect risk of falls: No Signs or symptoms of abuse/neglect since last visito No Hospitalized since last visit: No Implantable device outside of the clinic excluding cellular tissue based products placed in the center since last visit: No Pain Present Now: No Electronic Signature(s) Signed: 04/20/2020 8:21:43 AM By: Gretta Cool, BSN, RN, CWS, Kim RN, BSN Previous Signature:  04/18/2020 4:54:55 PM Version By: Lorine Bears RCP, RRT, CHT Entered By: Gretta Cool, BSN, RN, CWS, Kim on 04/20/2020 08:21:43 [name] ([id number]) -------------------------------------------------------------------------------- Clinic Level of Care Assessment Details Patient Name: [name] Date of Service: 04/18/2020 3:00 PM Medical Record Number: [id number] Patient Account Number: [id number] Date of Birth/Sex: [birth year] (73 y.o. M) Treating RN: Cornell Barman Primary Care Provider: Ria Bush Other Clinician: Referring Provider: Ria Bush Treating Provider/Extender: Tito Dine in Treatment: 0 Clinic Level of Care Assessment Items TOOL 1 Quantity Score []  - Use when EandM and Procedure is performed on INITIAL visit 0 ASSESSMENTS - Nursing Assessment / Reassessment X - General Physical Exam (combine w/ comprehensive assessment (listed just below) when performed on new 1 20 pt. evals) X- 1 25 Comprehensive Assessment (HX, ROS, Risk Assessments, Wounds Hx, etc.) ASSESSMENTS - Wound and Skin Assessment / Reassessment []  - Dermatologic / Skin Assessment (not related to wound area) 0 ASSESSMENTS - Ostomy and/or Continence Assessment and Care []  - Incontinence Assessment and Management 0 []  - 0 Ostomy Care Assessment and Management (repouching, etc.) PROCESS - Coordination of Care X - Simple Patient / Family Education for ongoing care 1 15 []  - 0 Complex (extensive) Patient / Family Education for ongoing care []  - 0 Staff obtains Programmer, systems, Records, Test Results / Process Orders []  - 0 Staff telephones HHA, Nursing Homes / Clarify orders / etc []  - 0 Routine Transfer to another Facility (non-emergent condition) []  - 0 Routine Hospital Admission (non-emergent condition) X- 1 15 New Admissions / Biomedical engineer / Ordering NPWT, Apligraf, etc. []  - 0 Emergency Hospital Admission (emergent condition) PROCESS - Special Needs []  -  Pediatric / Minor Patient Management 0 []  - 0 Isolation Patient Management []  - 0 Hearing / Language / Visual special needs []  - 0 Assessment of Community assistance (transportation, D/C planning, etc.) []  - 0 Additional assistance / Altered mentation []  - 0 Support Surface(s) Assessment (  bed, cushion, seat, etc.) INTERVENTIONS - Miscellaneous []  - External ear exam 0 []  - 0 Patient Transfer (multiple staff / Civil Service fast streamer / Similar devices) []  - 0 Simple Staple / Suture removal (25 or less) []  - 0 Complex Staple / Suture removal (26 or more) []  - 0 Hypo/Hyperglycemic Management (do not check if billed separately) []  - 0 Ankle / Brachial Index (ABI) - do not check if billed separately Has the patient been seen at the hospital within the last three years: Yes Total Score: 75 Level Of Care: New/Established - Level 2 MONTY, SPICHER ([id number]) Electronic Signature(s) Signed: 06/05/2020 12:53:52 PM By: Gretta Cool, BSN, RN, CWS, Kim RN, BSN Entered By: Gretta Cool, BSN, RN, CWS, Kim on 04/20/2020 08:26:20 [name] ([id number]) -------------------------------------------------------------------------------- Encounter Discharge Information Details Patient Name: [name] Date of Service: 04/18/2020 3:00 PM Medical Record Number: [id number] Patient Account Number: [id number] Date of Birth/Sex: Aug 31, 1946 (74 y.o. M) Treating RN: Cornell Barman Primary Care Provider: Ria Bush Other Clinician: Referring Provider: Ria Bush Treating Provider/Extender: Tito Dine in Treatment: 0 Encounter Discharge Information Items Discharge Condition: Stable Ambulatory Status: Wheelchair Discharge Destination: Home Transportation: Private Auto Accompanied By: caregiver Schedule Follow-up Appointment: Yes Clinical Summary of Care: Electronic Signature(s) Signed: 04/20/2020 8:39:17 AM By: Gretta Cool, BSN, RN, CWS, Kim RN, BSN Entered By: Gretta Cool, BSN, RN, CWS, Kim on 04/20/2020  08:39:17 [name] ([id number]) -------------------------------------------------------------------------------- Lower Extremity Assessment Details Patient Name: [name] Date of Service: 04/18/2020 3:00 PM Medical Record Number: [id number] Patient Account Number: [id number] Date of Birth/Sex: 1946-08-27 (73 y.o. M) Treating RN: Army Melia Primary Care Provider: Ria Bush Other Clinician: Referring Provider: Ria Bush Treating Provider/Extender: Tito Dine in Treatment: 0 Edema Assessment Assessed: [Left: No] [Right: No] Edema: [Left: Yes] [Right: No] Calf Left: Right: Point of Measurement: 34 cm From Medial Instep 38 cm 36 cm Ankle Left: Right: Point of Measurement: 10 cm From Medial Instep 26 cm 23 cm Vascular Assessment Pulses: Dorsalis Pedis Palpable: [Left:Yes] Posterior Tibial Palpable: [Left:Yes] Notes unable to obtain ABIs d/t wound location and pain Electronic Signature(s) Signed: 04/20/2020 8:22:15 AM By: Gretta Cool, BSN, RN, CWS, Kim RN, BSN Signed: 04/24/2020 10:45:21 AM By: Army Melia Previous Signature: 04/19/2020 11:19:24 AM Version By: Army Melia Entered By: Gretta Cool BSN, RN, CWS, Kim on 04/20/2020 08:22:14 [name] ([id number]) -------------------------------------------------------------------------------- Multi Wound Chart Details Patient Name: [name] Date of Service: 04/18/2020 3:00 PM Medical Record Number: [id number] Patient Account Number: [id number] Date of Birth/Sex: 1946/09/16 (74 y.o. M) Treating RN: Cornell Barman Primary Care Provider: Ria Bush Other Clinician: Referring Provider: Ria Bush Treating Provider/Extender: Tito Dine in Treatment: 0 Vital Signs Height(in): 72 Pulse(bpm): 34 Weight(lbs): 219 Blood Pressure(mmHg): 131/60 Body Mass Index(BMI): 30 Temperature(F): 98.2 Respiratory Rate(breaths/min): 18 Photos: Wound Location: Left, Anterior Lower Leg  Left, Proximal Lower Leg Left, Distal Lower Leg Wounding Event: Thermal Burn Thermal Burn Thermal Burn Primary Etiology: 3rd degree Burn 2nd degree Burn 2nd degree Burn Date Acquired: 04/12/2020 04/12/2020 04/12/2020 Weeks of Treatment: 0 0 0 Wound Status: Open Open Open Measurements L x W x D (cm) 1.5x0.5x0.1 11x5.5x0.1 13.7x8.7x0.1 Area (cm) : 0.589 47.517 93.612 Volume (cm) : 0.059 4.752 9.361 % Reduction in Area: N/A N/A 0.00% % Reduction in Volume: N/A N/A 0.00% Classification: Full Thickness Without Exposed Full Thickness Without Exposed Full Thickness Without Exposed Support Structures Support Structures Support Structures Exudate Amount: Medium Medium Medium Exudate Type: Serosanguineous Serosanguineous Serosanguineous Exudate Color: red, brown red, brown  red, brown Wound Margin: Flat and Intact Flat and Intact Flat and Intact Granulation Amount: Large (67-100%) Medium (34-66%) Small (1-33%) Granulation Quality: Red Red Red Necrotic Amount: Small (1-33%) Medium (34-66%) Large (67-100%) Exposed Structures: Fat Layer (Subcutaneous Tissue) Fat Layer (Subcutaneous Tissue) Fat Layer (Subcutaneous Tissue) Exposed: Yes Exposed: Yes Exposed: Yes Fascia: No Fascia: No Fascia: No Tendon: No Tendon: No Tendon: No Muscle: No Muscle: No Muscle: No Joint: No Joint: No Joint: No Bone: No Bone: No Bone: No Epithelialization: None None None Procedures Performed: N/A N/A Burn Debridement: Small Treatment Notes Wound #2 (Left, Anterior Lower Leg) 1. Cleansed with: Cleanse wound with antibacterial soap and water 2. Anesthetic Topical Lidocaine 4% cream to wound bed prior to debridement 4. Dressing Applied: [address] [place], [place] ([id number]) 7. Secured with 3 Layer Compression System - Left Lower Extremity Wound #3 (Left, Proximal Lower Leg) 1. Cleansed with: Cleanse wound with antibacterial soap and water 2. Anesthetic Topical Lidocaine 4% cream to wound bed prior to  debridement 4. Dressing Applied: Hydrafera Blue 7. Secured with 3 Layer Compression System - Left Lower Extremity Wound #4 (Left, Distal Lower Leg) 1. Cleansed with: Cleanse wound with antibacterial soap and water 2. Anesthetic Topical Lidocaine 4% cream to wound bed prior to debridement 4. Dressing Applied: Hydrafera Blue 7. Secured with 3 Layer Compression System - Left Lower Extremity Electronic Signature(s) Signed: 04/20/2020 8:26:55 AM By: Gretta Cool, BSN, RN, CWS, Kim RN, BSN Previous Signature: 04/20/2020 8:25:01 AM Version By: Gretta Cool, BSN, RN, CWS, Kim RN, BSN Entered By: Gretta Cool, BSN, RN, CWS, Kim on 04/20/2020 08:26:55 [name] ([id number]) -------------------------------------------------------------------------------- [place] Details Patient Name: [name] Date of Service: 04/18/2020 3:00 PM Medical Record Number: [id number] Patient Account Number: [id number] Date of Birth/Sex: Sep 01, 1946 (73 y.o. M) Treating RN: Cornell Barman Primary Care Provider: Ria Bush Other Clinician: Referring Provider: Ria Bush Treating Provider/Extender: Tito Dine in Treatment: 0 Active Inactive Necrotic Tissue Nursing Diagnoses: Impaired tissue integrity related to necrotic/devitalized tissue Goals: Necrotic/devitalized tissue will be minimized in the wound bed Date Initiated: 04/18/2020 Target Resolution Date: 05/02/2020 Goal Status: Active Interventions: Assess patient pain level pre-, during and post procedure and prior to discharge Notes: Orientation to the Wound Care Program Nursing Diagnoses: Knowledge deficit related to the wound healing center program Goals: Patient/caregiver will verbalize understanding of the [place] Date Initiated: 04/18/2020 Target Resolution Date: 05/02/2020 Goal Status: Active Interventions: Provide education on orientation to the wound center Notes: Pain, Acute or  Chronic Nursing Diagnoses: Pain, acute or chronic: actual or potential Potential alteration in comfort, pain Goals: Patient/caregiver will verbalize adequate pain control between visits Date Initiated: 04/18/2020 Target Resolution Date: 05/02/2020 Goal Status: Active Interventions: Assess comfort goal upon admission Treatment Activities: Refer to pain specialist or other pain support program : 04/18/2020 Notes: Wound/Skin Impairment Nursing Diagnoses: Impaired tissue integrity Goals: [name] ([id number]) Ulcer/skin breakdown will have a volume reduction of 30% by week 4 Date Initiated: 04/18/2020 Target Resolution Date: 05/19/2020 Goal Status: Active Interventions: Assess ulceration(s) every visit Treatment Activities: Skin care regimen initiated : 04/18/2020 Topical wound management initiated : 04/18/2020 Notes: Electronic Signature(s) Signed: 04/20/2020 8:23:54 AM By: Gretta Cool, BSN, RN, CWS, Kim RN, BSN Entered By: Gretta Cool, BSN, RN, CWS, Kim on 04/20/2020 08:23:54 [name] ([id number]) -------------------------------------------------------------------------------- Pain Assessment Details Patient Name: [name] Date of Service: 04/18/2020 3:00 PM Medical Record Number: [id number] Patient Account Number: [id number] Date of Birth/Sex: 1946/01/29 (73 y.o. M) Treating RN: Gretta Cool,  [place] Primary Care Provider: Ria Bush Other Clinician: Referring Provider: Ria Bush Treating Provider/Extender: Tito Dine in Treatment: 0 Active Problems Location of Pain Severity and Description of Pain Patient Has Paino No Site Locations Pain Management and Medication Current Pain Management: Electronic Signature(s) Signed: 04/20/2020 8:21:51 AM By: Gretta Cool, BSN, RN, CWS, Kim RN, BSN Previous Signature: 04/18/2020 4:54:55 PM Version By: Becky Sax, Sallie RCP, RRT, CHT Entered By: Gretta Cool, BSN, RN, CWS, Kim on 04/20/2020 08:21:51 [name]  ([id number]) -------------------------------------------------------------------------------- Patient/Caregiver Education Details Patient Name: [name] Date of Service: 04/18/2020 3:00 PM Medical Record Number: [id number] Patient Account Number: [id number] Date of Birth/Gender: 1945-12-30 (73 y.o. M) Treating RN: Cornell Barman Primary Care Physician: Ria Bush Other Clinician: Referring Physician: Ria Bush Treating Physician/Extender: Tito Dine in Treatment: 0 Education Assessment Education Provided To: Patient Education Topics Provided Venous: Handouts: Controlling Swelling with Multilayered Compression Wraps Methods: Demonstration, Explain/Verbal Responses: State content correctly Wound Debridement: Handouts: Wound Debridement Methods: Demonstration, Explain/Verbal Responses: State content correctly Wound/Skin Impairment: Handouts: Caring for Your Ulcer Methods: Demonstration, Explain/Verbal Responses: State content correctly Electronic Signature(s) Signed: 06/05/2020 12:53:52 PM By: Gretta Cool, BSN, RN, CWS, Kim RN, BSN Entered By: Gretta Cool, BSN, RN, CWS, Kim on 04/20/2020 08:26:35 [name] ([id number]) -------------------------------------------------------------------------------- Wound Assessment Details Patient Name: [name] Date of Service: 04/18/2020 3:00 PM Medical Record Number: [id number] Patient Account Number: [id number] Date of Birth/Sex: 06-07-1946 (73 y.o. M) Treating RN: Army Melia Primary Care Provider: Ria Bush Other Clinician: Referring Provider: Ria Bush Treating Provider/Extender: Ricard Dillon Weeks in Treatment: 0 Wound Status Wound Number: 2 Primary 3rd degree Burn Etiology: Wound Location: Left, Anterior Lower Leg Wound Open Wounding Event: Thermal Burn Status: Date Acquired: 04/12/2020 Comorbid Sleep Apnea, Arrhythmia, Hypertension, Type II Weeks Of Treatment: 0 History: Diabetes,  Osteoarthritis, Neuropathy, Received Radiation Clustered Wound: No Photos Wound Measurements Length: (cm) 1.5 Width: (cm) 0.5 Depth: (cm) 0.1 Area: (cm) 0.589 Volume: (cm) 0.059 % Reduction in Area: 0% % Reduction in Volume: 0% Epithelialization: None Tunneling: No Undermining: No Wound Description Classification: Full Thickness Without Exposed Support Structu Wound Margin: Flat and Intact Exudate Amount: Medium Exudate Type: Serosanguineous Exudate Color: red, brown res Foul Odor After Cleansing: No Slough/Fibrino Yes Wound Bed Granulation Amount: Large (67-100%) Exposed Structure Granulation Quality: Red Fascia Exposed: No Necrotic Amount: Small (1-33%) Fat Layer (Subcutaneous Tissue) Exposed: Yes Necrotic Quality: Adherent Slough Tendon Exposed: No Muscle Exposed: No Joint Exposed: No Bone Exposed: No Electronic Signature(s) Signed: 04/25/2020 8:45:28 AM By: Gretta Cool, BSN, RN, CWS, Kim RN, BSN Signed: 05/02/2020 4:03:16 PM By: Army Melia Previous Signature: 04/19/2020 11:19:24 AM Version By: Army Melia Entered By: Gretta Cool BSN, RN, CWS, Kim on 04/25/2020 08:45:28 [name] ([id number]) -------------------------------------------------------------------------------- Wound Assessment Details Patient Name: [name] Date of Service: 04/18/2020 3:00 PM Medical Record Number: [id number] Patient Account Number: [id number] Date of Birth/Sex: 1946/02/14 (73 y.o. M) Treating RN: Army Melia Primary Care Provider: Ria Bush Other Clinician: Referring Provider: Ria Bush Treating Provider/Extender: Ricard Dillon Weeks in Treatment: 0 Wound Status Wound Number: 3 Primary 2nd degree Burn Etiology: Wound Location: Left, Proximal Lower Leg Wound Open Wounding Event: Thermal Burn Status: Date Acquired: 04/12/2020 Comorbid Sleep Apnea, Arrhythmia, Hypertension, Type II Weeks Of Treatment: 0 History: Diabetes, Osteoarthritis, Neuropathy, Received  Radiation Clustered Wound: No Photos Wound Measurements Length: (cm) 11 Width: (cm) 5.5 Depth: (cm) 0.1 Area: (cm) 47.517 Volume: (cm) 4.752 % Reduction in Area: 0% % Reduction in Volume: 0% Epithelialization: None  Tunneling: No Undermining: No Wound Description Classification: Full Thickness Without Exposed Support Structu Wound Margin: Flat and Intact Exudate Amount: Medium Exudate Type: Serosanguineous Exudate Color: red, brown res Foul Odor After Cleansing: No Slough/Fibrino Yes Wound Bed Granulation Amount: Medium (34-66%) Exposed Structure Granulation Quality: Red Fascia Exposed: No Necrotic Amount: Medium (34-66%) Fat Layer (Subcutaneous Tissue) Exposed: Yes Necrotic Quality: Adherent Slough Tendon Exposed: No Muscle Exposed: No Joint Exposed: No Bone Exposed: No Electronic Signature(s) Signed: 04/25/2020 8:45:44 AM By: Gretta Cool, BSN, RN, CWS, Kim RN, BSN Signed: 05/02/2020 4:03:16 PM By: Army Melia Previous Signature: 04/19/2020 11:19:24 AM Version By: Army Melia Entered By: Gretta Cool BSN, RN, CWS, Kim on 04/25/2020 08:45:44 [name] ([id number]) -------------------------------------------------------------------------------- Wound Assessment Details Patient Name: [name] Date of Service: 04/18/2020 3:00 PM Medical Record Number: [id number] Patient Account Number: [id number] Date of Birth/Sex: Jan 11, 1946 (73 y.o. M) Treating RN: Army Melia Primary Care Provider: Ria Bush Other Clinician: Referring Provider: Ria Bush Treating Provider/Extender: Ricard Dillon Weeks in Treatment: 0 Wound Status Wound Number: 4 Primary 2nd degree Burn Etiology: Wound Location: Left, Distal Lower Leg Wound Open Wounding Event: Thermal Burn Status: Date Acquired: 04/12/2020 Comorbid Sleep Apnea, Arrhythmia, Hypertension, Type II Weeks Of Treatment: 0 History: Diabetes, Osteoarthritis, Neuropathy, Received Radiation Clustered Wound:  No Photos Wound Measurements Length: (cm) 13.7 Width: (cm) 8.7 Depth: (cm) 0.1 Area: (cm) 93.612 Volume: (cm) 9.361 % Reduction in Area: 0% % Reduction in Volume: 0% Epithelialization: None Tunneling: No Undermining: No Wound Description Classification: Full Thickness Without Exposed Support Structu Wound Margin: Flat and Intact Exudate Amount: Medium Exudate Type: Serosanguineous Exudate Color: red, brown res Foul Odor After Cleansing: No Slough/Fibrino Yes Wound Bed Granulation Amount: Small (1-33%) Exposed Structure Granulation Quality: Red Fascia Exposed: No Necrotic Amount: Large (67-100%) Fat Layer (Subcutaneous Tissue) Exposed: Yes Necrotic Quality: Adherent Slough Tendon Exposed: No Muscle Exposed: No Joint Exposed: No Bone Exposed: No Electronic Signature(s) Signed: 04/25/2020 8:45:57 AM By: Gretta Cool, BSN, RN, CWS, Kim RN, BSN Signed: 05/02/2020 4:03:16 PM By: Army Melia Previous Signature: 04/19/2020 11:19:24 AM Version By: Army Melia Entered By: Gretta Cool BSN, RN, CWS, Kim on 04/25/2020 08:45:57 [name] ([id number]) -------------------------------------------------------------------------------- Vitals Details Patient Name: [name] Date of Service: 04/18/2020 3:00 PM Medical Record Number: [id number] Patient Account Number: [id number] Date of Birth/Sex: [birth year] (73 y.o. M) Treating RN: Cornell Barman Primary Care Provider: Ria Bush Other Clinician: Referring Provider: Ria Bush Treating Provider/Extender: Tito Dine in Treatment: 0 Vital Signs Time Taken: 15:20 Temperature (F): 98.2 Height (in): 72 Pulse (bpm): 54 Source: Stated Respiratory Rate (breaths/min): 18 Weight (lbs): 219 Blood Pressure (mmHg): 131/60 Source: Stated Reference Range: 80 - 120 mg / dl Body Mass Index (BMI): 29.7 Electronic Signature(s) Signed: 04/20/2020 8:21:59 AM By: Gretta Cool, BSN, RN, CWS, Kim RN, BSN Previous Signature: 04/18/2020  4:54:55 PM Version By: Lorine Bears RCP, RRT, CHT Entered By: Gretta Cool, BSN, RN, CWS, Kim on 04/20/2020 08:21:58
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
## 2020-06-05 NOTE — Patient Instructions (Signed)
Dear [name],  Below is a summary of the goals we discussed during our follow up appointment on June 05, 2020. Please contact me anytime with questions or concerns.   Visit Information  Goals Addressed            This Visit's Progress   . Pharmacy Care Plan       CARE PLAN ENTRY  Current Barriers:  . Chronic Disease Management support, education, and care coordination needs related to Hyperlipidemia and Diabetes   Hyperlipidemia . Pharmacist Clinical Goal(s): o Over the next 30 days, patient will work with PharmD and providers to maintain LDL goal < 100 - LDL goal achieved 04/16/20 . Current regimen:   Pravastatin 20 mg - 1 tablet daily  Fish oil 1000 mg - 1 capsule daily  . Interventions: o Recommend annual lipid panel - completed, exercise as able, low cholesterol diet  . Patient self care activities - Over the next 30 days, patient will: o Review cholesterol content in foods handout and improve dietary choices  Diabetes . Pharmacist Clinical Goal(s): o Over the next 2 weeks, patient will work with PharmD and providers to achieve A1c goal < 7% . Current regimen:  o Metformin 500 mg ER - 2 tablets once daily o Glipizide 5 mg - 1 tablet daily 30 minutes before breakfast  . Interventions: o Recommend close glucose monitoring for 2 weeks  . Patient self care activities - Over the next 2 weeks, patient will: o Check check blood sugar at least three days per week before breakfast, document, and provide at follow up appointment with PharmD o Contact provider with any episodes of hypoglycemia (BG < 70 mg/dL) o Continue current medications o Only take glipizide within 30 minutes of eating a meal   Medication Cost .  Emailed application for Spiriva cost assistance program. Please call with any questions.  Please see past updates related to this goal by clicking on the "Past Updates" button in the selected goal       The patient verbalized understanding of  instructions provided today and agreed to receive a mailed copy of patient instruction and/or educational materials. Telephone follow up appointment with pharmacy team member scheduled for: June 20, 2020 at 1 PM (telephone) for blood glucose log  [name], PharmD Clinical Pharmacist [facility] [phone]   Hypoglycemia Hypoglycemia is when the sugar (glucose) level in your blood is too low. Signs of low blood sugar may include:  Feeling: ? Hungry. ? Worried or nervous (anxious). ? Sweaty and clammy. ? Confused. ? Dizzy. ? Sleepy. ? Sick to your stomach (nauseous).  Having: ? A fast heartbeat. ? A headache. ? A change in your vision. ? Tingling or no feeling (numbness) around your mouth, lips, or tongue. ? Jerky movements that you cannot control (seizure).  Having trouble with: ? Moving (coordination). ? Sleeping. ? Passing out (fainting). ? Getting upset easily (irritability). Low blood sugar can happen to people who have diabetes and people who do not have diabetes. Low blood sugar can happen quickly, and it can be an emergency. Treating low blood sugar Low blood sugar is often treated by eating or drinking something sugary right away, such as:  Fruit juice, 4-6 oz (120-150 mL).  Regular soda (not diet soda), 4-6 oz (120-150 mL).  Low-fat milk, 4 oz (120 mL).  Several pieces of hard candy.  Sugar or honey, 1 Tbsp (15 mL). Treating low blood sugar if you have diabetes  If you can think clearly and swallow safely, follow the 15:15 rule:  Take 15 grams of a fast-acting carb (carbohydrate). Talk with your doctor about how much you should take.  Always keep a source of fast-acting carb with you, such as: ? Sugar tablets (glucose pills). Take 3-4 pills. ? 6-8 pieces of hard candy. ? 4-6 oz (120-150 mL) of fruit juice. ? 4-6 oz (120-150 mL) of regular (not diet) soda. ? 1 Tbsp (15 mL) honey or sugar.  Check your blood sugar 15  minutes after you take the carb.  If your blood sugar is still at or below 70 mg/dL (3.9 mmol/L), take 15 grams of a carb again.  If your blood sugar does not go above 70 mg/dL (3.9 mmol/L) after 3 tries, get help right away.  After your blood sugar goes back to normal, eat a meal or a snack within 1 hour.  Treating very low blood sugar If your blood sugar is at or below 54 mg/dL (3 mmol/L), you have very low blood sugar (severe hypoglycemia). This may also cause:  Passing out.  Jerky movements you cannot control (seizure).  Losing consciousness (coma). This is an emergency. Do not wait to see if the symptoms will go away. Get medical help right away. Call your local emergency services (911 in the [place] Do not drive yourself to the hospital. If you have very low blood sugar and you cannot eat or drink, you may need a glucagon shot (injection). A family member or friend should learn how to check your blood sugar and how to give you a glucagon shot. Ask your doctor if you need to have a glucagon shot kit at home. Follow these instructions at home: General instructions  Take over-the-counter and prescription medicines only as told by your doctor.  Stay aware of your blood sugar as told by your doctor.  Limit alcohol intake to no more than 1 drink a day for nonpregnant women and 2 drinks a day for men. One drink equals 12 oz of beer (355 mL), 5 oz of wine (148 mL), or 1 oz of hard liquor (44 mL).  Keep all follow-up visits as told by your doctor. This is important. If you have diabetes:   Follow your diabetes care plan as told by your doctor. Make sure you: ? Know the signs of low blood sugar. ? Take your medicines as told. ? Follow your exercise and meal plan. ? Eat on time. Do not skip meals. ? Check your blood sugar as often as told by your doctor. Always check it before and after exercise. ? Follow your sick day plan when you cannot eat or drink normally. Make this plan ahead  of time with your doctor.  Share your diabetes care plan with: ? Your work or school. ? People you live with.  Check your pee (urine) for ketones: ? When you are sick. ? As told by your doctor.  Carry a card or wear jewelry that says you have diabetes. Contact a doctor if:  You have trouble keeping your blood sugar in your target range.  You have low blood sugar often. Get help right away if:  You still have symptoms after you eat or drink something sugary.  Your blood sugar is at or below 54 mg/dL (3 mmol/L).  You have jerky movements that you cannot control.  You pass out. These symptoms may be an emergency. Do not wait to see if the symptoms will go away. Get medical  help right away. Call your local emergency services (911 in the [place] Do not drive yourself to the hospital. Summary  Hypoglycemia happens when the level of sugar (glucose) in your blood is too low.  Low blood sugar can happen to people who have diabetes and people who do not have diabetes. Low blood sugar can happen quickly, and it can be an emergency.  Make sure you know the signs of low blood sugar and know how to treat it.  Always keep a source of sugar (fast-acting carb) with you to treat low blood sugar. This information is not intended to replace advice given to you by your health care provider. Make sure you discuss any questions you have with your health care provider. Document Revised: 03/17/2019 Document Reviewed: 12/28/2015 Elsevier Patient Education  2020 [employer].
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
## 2020-06-06 NOTE — Progress Notes (Signed)
I have collaborated with the care management provider regarding care management and care coordination activities outlined in this encounter and have reviewed this encounter including documentation in the note and care plan. I am certifying that I agree with the content of this note and encounter as supervising physician.  

## 2020-06-07 ENCOUNTER — Encounter: Payer: Self-pay | Admitting: Family Medicine

## 2020-06-07 ENCOUNTER — Ambulatory Visit (INDEPENDENT_AMBULATORY_CARE_PROVIDER_SITE_OTHER): Payer: Medicare Other | Admitting: Family Medicine

## 2020-06-07 ENCOUNTER — Other Ambulatory Visit: Payer: Self-pay

## 2020-06-07 VITALS — BP 124/60 | HR 50 | Temp 97.8°F | Ht 70.75 in | Wt 222.5 lb

## 2020-06-07 DIAGNOSIS — E1140 Type 2 diabetes mellitus with diabetic neuropathy, unspecified: Secondary | ICD-10-CM | POA: Diagnosis not present

## 2020-06-07 DIAGNOSIS — R197 Diarrhea, unspecified: Secondary | ICD-10-CM

## 2020-06-07 DIAGNOSIS — R2689 Other abnormalities of gait and mobility: Secondary | ICD-10-CM | POA: Diagnosis not present

## 2020-06-07 DIAGNOSIS — R21 Rash and other nonspecific skin eruption: Secondary | ICD-10-CM | POA: Diagnosis not present

## 2020-06-07 DIAGNOSIS — IMO0002 Reserved for concepts with insufficient information to code with codable children: Secondary | ICD-10-CM

## 2020-06-07 DIAGNOSIS — E1165 Type 2 diabetes mellitus with hyperglycemia: Secondary | ICD-10-CM

## 2020-06-07 DIAGNOSIS — T24232D Burn of second degree of left lower leg, subsequent encounter: Secondary | ICD-10-CM
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
## 2020-06-07 NOTE — Assessment & Plan Note (Signed)
No recent falls. Awaiting delivery of power wheelchair.  Upcoming neurology appt.

## 2020-06-07 NOTE — Assessment & Plan Note (Signed)
Ongoing despite decreasing metformin XR dose to 1000mg 

## 2020-06-07 NOTE — Assessment & Plan Note (Signed)
Metformin dropped to 1000mg  XR daily due to intermittent diarrhea, glipizide 5mg  added with breakfast. He continues to hold metformin when he gets diarrhea.
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
## 2020-06-07 NOTE — Assessment & Plan Note (Addendum)
Marked improvement, wound has fully closed, with residual dry skin. Released from wound clinic.

## 2020-06-07 NOTE — Patient Instructions (Addendum)
Ok to use itch cream as up to now.  I'm glad it's better.

## 2020-06-14 ENCOUNTER — Other Ambulatory Visit: Payer: Self-pay

## 2020-06-14 ENCOUNTER — Ambulatory Visit (INDEPENDENT_AMBULATORY_CARE_PROVIDER_SITE_OTHER): Payer: Medicare Other | Admitting: Family Medicine

## 2020-06-14 ENCOUNTER — Ambulatory Visit: Payer: Medicare Other | Admitting: Family Medicine

## 2020-06-14 ENCOUNTER — Encounter: Payer: Self-pay | Admitting: Family Medicine

## 2020-06-14 VITALS — BP 126/74 | HR 50 | Temp 98.0°F | Ht 70.75 in | Wt 222.3 lb

## 2020-06-14 DIAGNOSIS — R21 Rash and other nonspecific skin eruption: Secondary | ICD-10-CM

## 2020-06-14 DIAGNOSIS — Z7901 Long term (current) use of anticoagulants: Secondary | ICD-10-CM

## 2020-06-14 DIAGNOSIS — R197 Diarrhea, unspecified: Secondary | ICD-10-CM | POA: Diagnosis not present

## 2020-06-14 NOTE — Assessment & Plan Note (Signed)
Forearm cleaned with alcohol pad without true rash present. Discussed cleaning forearm at home with mild soap and then starting applying moisturizing cream regularly to prevent further irrigation.

## 2020-06-14 NOTE — Assessment & Plan Note (Signed)
Previously diarrhea resolved off metformin. He currently continues taking metformin XR 2000mg  BID. I recommend dropping dose to 500mg  XR once daily and monitor effect on diarrhea. Given prolonged nature of diarrhea, will also send GI pathogen panel as well as fecal fat to eval for infection or malabsorption.
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
## 2020-06-14 NOTE — Assessment & Plan Note (Signed)
Overdue for coumadin check - will call to schedule coumadin clinic appt.

## 2020-06-14 NOTE — Patient Instructions (Addendum)
Wash forearms with soapy water then use moisturizing cream.  Restart metformin XR 500mg  once daily to see if low dose is tolerable.  Pass by lab to pick up stool tests - to rule out infection or malabsorption as cause of diarrhea.

## 2020-06-16 ENCOUNTER — Other Ambulatory Visit: Payer: Self-pay | Admitting: Family Medicine

## 2020-06-18 ENCOUNTER — Other Ambulatory Visit: Payer: Self-pay

## 2020-06-18 ENCOUNTER — Ambulatory Visit (INDEPENDENT_AMBULATORY_CARE_PROVIDER_SITE_OTHER): Payer: Medicare Other

## 2020-06-18 DIAGNOSIS — I4811 Longstanding persistent atrial fibrillation: Secondary | ICD-10-CM

## 2020-06-18 DIAGNOSIS — I4891 Unspecified atrial fibrillation: Secondary | ICD-10-CM | POA: Diagnosis not present

## 2020-06-18 DIAGNOSIS — Z5181 Encounter for therapeutic drug level monitoring: Secondary | ICD-10-CM | POA: Diagnosis not present

## 2020-06-18 LAB — POCT INR: INR: 1.7 — AB (ref 2.0–3.0)

## 2020-06-18 NOTE — Patient Instructions (Signed)
-    START NEW DOSAGE of warfarin 1/2 tablet every day EXCEPT 1 tablet on MONDAYS & FRIDAYS - recheck INR in 3 weeks

## 2020-06-18 NOTE — Telephone Encounter (Signed)
ERx 

## 2020-06-18 NOTE — Telephone Encounter (Signed)
Name of Medication: Lomotil Name of Pharmacy: [facility] Last Fill or Written Date and Quantity: 10/23/19, #20 Last Office Visit and Type: 06/14/20, acute rash Next Office Visit and Type: none Last Controlled Substance Agreement Date: 7/0/18 Last UDS: 07/06/17

## 2020-06-20 ENCOUNTER — Telehealth: Payer: Medicare Other

## 2020-06-21 ENCOUNTER — Other Ambulatory Visit: Payer: Self-pay | Admitting: Family Medicine

## 2020-06-21 DIAGNOSIS — G894 Chronic pain syndrome: Secondary | ICD-10-CM

## 2020-06-21 NOTE — Telephone Encounter (Signed)
Name of Medication: Hydrocodone-APAP Name of Pharmacy: [facility] Last Fill or Written Date and Quantity: 05/28/20, #120 Last Office Visit and Type: 06/14/20, skin rash Next Office Visit and Type: none Last Controlled Substance Agreement Date: 07/06/17 Last UDS: 07/06/17  Message from pharmacy per pt: I am requesting authorization for refill. I am near empty and the refill date is next Tuesday. Thank you.

## 2020-06-26 MED ORDER — HYDROCODONE-ACETAMINOPHEN 10-325 MG PO TABS: 1.0000 | ORAL_TABLET | Freq: Four times a day (QID) | ORAL | 0 refills | Status: DC | PRN

## 2020-06-26 NOTE — Telephone Encounter (Signed)
ERx 

## 2020-06-27 ENCOUNTER — Other Ambulatory Visit: Payer: Self-pay | Admitting: Cardiovascular Disease
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
## 2020-06-29 ENCOUNTER — Telehealth: Payer: Medicare Other

## 2020-07-04 ENCOUNTER — Other Ambulatory Visit: Payer: Self-pay

## 2020-07-04 ENCOUNTER — Encounter: Payer: Medicare Other | Attending: Internal Medicine | Admitting: Internal Medicine

## 2020-07-04 DIAGNOSIS — E11622 Type 2 diabetes mellitus with other skin ulcer: Secondary | ICD-10-CM | POA: Insufficient documentation

## 2020-07-04 DIAGNOSIS — I10 Essential (primary) hypertension: Secondary | ICD-10-CM | POA: Diagnosis not present

## 2020-07-04 DIAGNOSIS — L97822 Non-pressure chronic ulcer of other part of left lower leg with fat layer exposed: Secondary | ICD-10-CM | POA: Diagnosis not present

## 2020-07-04 DIAGNOSIS — I480 Paroxysmal atrial fibrillation: Secondary | ICD-10-CM | POA: Insufficient documentation

## 2020-07-04 DIAGNOSIS — L97821 Non-pressure chronic ulcer of other part of left lower leg limited to breakdown of skin: Secondary | ICD-10-CM | POA: Insufficient documentation

## 2020-07-04 DIAGNOSIS — I872 Venous insufficiency (chronic) (peripheral): Secondary | ICD-10-CM | POA: Diagnosis not present

## 2020-07-04 DIAGNOSIS — G4730 Sleep apnea, unspecified: Secondary | ICD-10-CM | POA: Insufficient documentation

## 2020-07-04 DIAGNOSIS — E785 Hyperlipidemia, unspecified: Secondary | ICD-10-CM | POA: Insufficient documentation

## 2020-07-04 DIAGNOSIS — Z7901 Long term (current) use of anticoagulants: Secondary | ICD-10-CM | POA: Diagnosis not present

## 2020-07-04 DIAGNOSIS — E1140 Type 2 diabetes mellitus with diabetic neuropathy, unspecified: Secondary | ICD-10-CM | POA: Diagnosis not present

## 2020-07-04 NOTE — Progress Notes (Signed)
[name], [name] ([id number]) Visit Report for 07/04/2020 Abuse/Suicide Risk Screen Details Patient Name: [name], [name] Date of Service: 07/04/2020 1:15 PM Medical Record Number: [id number] Patient Account Number: [id number] Date of Birth/Sex: 1946/05/21 (74 y.o. M) Treating RN: Cornell Barman Primary Care Calandra Madura: Ria Bush Other Clinician: Referring Jonanthony Nahar: Referral, Self Treating Yovany Clock/Extender: Tito Dine in Treatment: 0 Abuse/Suicide Risk Screen Items Answer ABUSE RISK SCREEN: Has anyone close to you tried to hurt or harm you recentlyo No Do you feel uncomfortable with anyone in your familyo No Has anyone forced you do things that you didnot want to doo No Electronic Signature(s) Signed: 07/04/2020 2:14:25 PM By: Darci Needle Signed: 07/04/2020 4:56:00 PM By: Gretta Cool, BSN, RN, CWS, Kim RN, BSN Entered By: Darci Needle on 07/04/2020 13:54:06 [name] ([id number]) -------------------------------------------------------------------------------- Activities of Daily Living Details Patient Name: [name] Date of Service: 07/04/2020 1:15 PM Medical Record Number: [id number] Patient Account Number: [id number] Date of Birth/Sex: [birth year] (74 y.o. M) Treating RN: Cornell Barman Primary Care Cauy Melody: Ria Bush Other Clinician: Referring Jenisha Faison: Referral, Self Treating Dierdre Mccalip/Extender: Tito Dine in Treatment: 0 Activities of Daily Living Items Answer Activities of Daily Living (Please select one for each item) Drive Automobile Completely Able Take Medications Completely Able Use Telephone Completely Able Care for Appearance Completely Able Use Toilet Completely Able Bath / Shower Completely Able Dress Self Completely Able Feed Self Completely Able Walk Completely Able Get In / Out Bed Completely Able Housework Completely Able Prepare Meals Completely Able Handle Money Completely Able Shop for Self Completely  Able Electronic Signature(s) Signed: 07/04/2020 2:14:25 PM By: Darci Needle Signed: 07/04/2020 4:56:00 PM By: Gretta Cool, BSN, RN, CWS, Kim RN, BSN Entered By: Darci Needle on 07/04/2020 13:54:58 [name] ([id number]) -------------------------------------------------------------------------------- Education Screening Details Patient Name: [name] Date of Service: 07/04/2020 1:15 PM Medical Record Number: [id number] Patient Account Number: [id number] Date of Birth/Sex: 10/20/1946 (74 y.o. M) Treating RN: Cornell Barman Primary Care Alson Mcpheeters: Ria Bush Other Clinician: Referring Aniyiah Zell: Referral, Self Treating Hermie Reagor/Extender: Tito Dine in Treatment: 0 Learning Preferences/Education Level/Primary Language Learning Preference: Explanation Preferred Language: English Cognitive Barrier Language Barrier: No Translator Needed: No Memory Deficit: No Emotional Barrier: No Cultural/Religious Beliefs Affecting Medical Care: No Physical Barrier Impaired Vision: No Impaired Hearing: No Decreased Hand dexterity: No Knowledge/Comprehension Knowledge Level: High Comprehension Level: High Ability to understand written instructions: High Ability to understand verbal instructions: High Motivation Anxiety Level: Calm Cooperation: Cooperative Education Importance: Denies Need Interest in Health Problems: Asks Questions Perception: Coherent Willingness to Engage in Self-Management High Activities: Readiness to Engage in Self-Management High Activities: Electronic Signature(s) Signed: 07/04/2020 2:14:25 PM By: Darci Needle Signed: 07/04/2020 4:56:00 PM By: Gretta Cool, BSN, RN, CWS, Kim RN, BSN Entered By: Darci Needle on 07/04/2020 13:55:56 [name] ([id number]) -------------------------------------------------------------------------------- Fall Risk Assessment Details Patient Name: [name] Date of Service: 07/04/2020 1:15 PM Medical  Record Number: [id number] Patient Account Number: [id number] Date of Birth/Sex: [birth year] (74 y.o. M) Treating RN: Cornell Barman Primary Care Temiloluwa [place]: Ria Bush Other Clinician: Referring Soul Deveney: Referral, Self Treating Marija Calamari/Extender: Tito Dine in Treatment: 0 Fall Risk Assessment Items Have you had 2 or more falls in the last 12 monthso 0 Yes Have you had any fall that resulted in injury in the last 12 monthso 0 No FALLS RISK SCREEN History of falling - immediate or within 3 months 25 Yes Secondary diagnosis (Do you have 2 or more medical diagnoseso) 15 Yes Ambulatory aid  None/bed rest/wheelchair/nurse 0 No Crutches/cane/walker 0 No Furniture 0 No Intravenous therapy Access/Saline/Heparin Lock 0 No Gait/Transferring Normal/ bed rest/ wheelchair 0 No Weak (short steps with or without shuffle, stooped but able to lift head while walking, may 0 No seek support from furniture) Impaired (short steps with shuffle, may have difficulty arising from chair, head down, impaired 0 No balance) Mental Status Oriented to own ability 0 No Electronic Signature(s) Signed: 07/04/2020 2:14:25 PM By: Darci Needle Signed: 07/04/2020 4:56:00 PM By: Gretta Cool, BSN, RN, CWS, Kim RN, BSN Entered By: Darci Needle on 07/04/2020 13:57:13 [name] ([id number]) -------------------------------------------------------------------------------- Foot Assessment Details Patient Name: [name] Date of Service: 07/04/2020 1:15 PM Medical Record Number: [id number] Patient Account Number: [id number] Date of Birth/Sex: 1946-07-16 (74 y.o. M) Treating RN: Grover Canavan Primary Care Maleeah Crossman: Ria Bush Other Clinician: Referring Flonnie Wierman: Referral, Self Treating Silvester Reierson/Extender: Tito Dine in Treatment: 0 Foot Assessment Items Site Locations + = Sensation present, - = Sensation absent, C = Callus, U = Ulcer R = Redness, W = Warmth, M = Maceration, PU  = Pre-ulcerative lesion F = Fissure, S = Swelling, D = Dryness Assessment Right: Left: Other Deformity: No No Prior Foot Ulcer: No No Prior Amputation: No No Charcot Joint: No No Ambulatory Status: Ambulatory Without Help Gait: Steady Electronic Signature(s) Signed: 07/04/2020 4:16:31 PM By: Grover Canavan Entered By: Grover Canavan on 07/04/2020 14:00:24 [name] ([id number]) -------------------------------------------------------------------------------- Nutrition Risk Screening Details Patient Name: [name] Date of Service: 07/04/2020 1:15 PM Medical Record Number: [id number] Patient Account Number: [id number] Date of Birth/Sex: Dec 17, 1945 (75 y.o. M) Treating RN: Cornell Barman Primary Care Kohana Amble: Ria Bush Other Clinician: Referring Sritha Chauncey: Referral, Self Treating Ashauna Bertholf/Extender: Tito Dine in Treatment: 0 Height (in): 72 Weight (lbs): 223 Body Mass Index (BMI): 30.2 Nutrition Risk Screening Items Score Screening NUTRITION RISK SCREEN: I have an illness or condition that made me change the kind and/or amount of food I eat 0 No I eat fewer than two meals per day 0 No I eat few fruits and vegetables, or milk products 0 No I have three or more drinks of beer, liquor or wine almost every day 0 No I have tooth or mouth problems that make it hard for me to eat 0 No I don't always have enough money to buy the food I need 0 No I eat alone most of the time 0 No I take three or more different prescribed or over-the-counter drugs a day 0 No Without wanting to, I have lost or gained 10 pounds in the last six months 0 No I am not always physically able to shop, cook and/or feed myself 0 No Nutrition Protocols Good Risk Protocol 0 No interventions needed Moderate Risk Protocol High Risk Proctocol Risk Level: Good Risk Score: 0 Electronic Signature(s) Signed: 07/04/2020 2:14:25 PM By: Darci Needle Signed: 07/04/2020 4:56:00 PM By: Gretta Cool,  BSN, RN, CWS, Kim RN, BSN Entered By: Darci Needle on 07/04/2020 13:57:32

## 2020-07-04 NOTE — Progress Notes (Signed)
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 Referring Provider: Referral, Self Treating Provider/Extender: Tito Dine in Treatment: 0 Information Obtained From Patient Eyes Medical History: Negative for: Cataracts; Glaucoma; Optic Neuritis Ear/Nose/Mouth/Throat Medical History: Negative for: Chronic sinus problems/congestion; Middle ear problems Hematologic/Lymphatic Medical History: Negative for: Anemia; Hemophilia; Human Immunodeficiency Virus; Lymphedema; Sickle Cell Disease Respiratory Medical History: Positive for: Sleep Apnea Negative for: Aspiration; Asthma; Chronic Obstructive Pulmonary Disease (COPD); Pneumothorax; Tuberculosis Cardiovascular Medical History: Positive for: Arrhythmia - a fib; Hypertension Negative for: Angina; Congestive Heart Failure; Coronary Artery Disease; Deep Vein Thrombosis; Hypotension; Myocardial Infarction; Peripheral Arterial Disease; Peripheral Venous Disease; Phlebitis; Vasculitis Gastrointestinal Medical History: Negative for: Cirrhosis ; Colitis;  Crohnos; Hepatitis A; Hepatitis B; Hepatitis C Endocrine Medical History: Positive for: Type II Diabetes Negative for: Type I Diabetes Treated with: Oral agents Blood sugar tested every day: Yes Tested : BID Genitourinary Medical History: Negative for: End Stage Renal Disease Immunological Medical History: Negative for: Lupus Erythematosus; Raynaudos; Scleroderma Integumentary (Skin) Medical History: Negative for: History of Burn; History of pressure wounds Bolio, Arnette Norris ([id number]) Musculoskeletal Medical History: Positive for: Osteoarthritis Negative for: Gout; Rheumatoid Arthritis; Osteomyelitis Neurologic Medical History: Positive for: Neuropathy Negative for: Dementia; Quadriplegia; Paraplegia; Seizure Disorder Oncologic Medical History: Positive for: Received Radiation Negative for: Received Chemotherapy Past Medical History Notes: prostate cancer Psychiatric Medical History: Negative for: Anorexia/bulimia; Confinement Anxiety Immunizations Pneumococcal Vaccine: Received Pneumococcal Vaccination: No Implantable Devices None Family and Social History Cancer: Yes - Siblings; Diabetes: Yes - Father,Mother,Siblings; Heart Disease: Yes - Father; Hereditary Spherocytosis: No; Hypertension: Yes - Father; Kidney Disease: No; Lung Disease: Yes - Father; Seizures: No; Stroke: No; Thyroid Problems: No; Tuberculosis: No; Current every day smoker; Marital Status - Married; Alcohol Use: Rarely; Drug Use: No History; Caffeine Use: Daily; Financial Concerns: No; Food, Clothing or Shelter Needs: No; Support System Lacking: No; Transportation Concerns: No Electronic Signature(s) Signed: 07/04/2020 2:14:25 PM By: Darci Needle Signed: 07/04/2020 3:56:44 PM By: Linton Ham MD Signed: 07/04/2020 4:56:00 PM By: Gretta Cool BSN, RN, CWS, Kim RN, BSN Entered By: Darci Needle on 07/04/2020 13:53:44 [name]  ([id number]) -------------------------------------------------------------------------------- SuperBill Details Patient Name: [name] Date of Service: 07/04/2020 Medical Record Number: [id number] Patient Account Number: [id number] Date of Birth/Sex: Nov 24, 1946 (74 y.o. M) Treating RN: Cornell Barman Primary Care  Provider: Ria Bush Other Clinician: Referring Provider: Referral, Self Treating Provider/Extender: Tito Dine in Treatment: 0 Diagnosis Coding ICD-10 Codes Code Description 6083722277 Non-pressure chronic ulcer of other part of left lower leg limited to breakdown of skin E11.622 Type 2 diabetes mellitus with other skin ulcer Facility Procedures CPT4 Code: 22025427 Description: [facility] VISIT-LEV 3 EST PT Modifier: Quantity: 1 CPT4 Code: 06237628 Description: (Facility Use Only) 29581LT - [place] LWR LT LEG Modifier: Quantity: 1 Physician Procedures CPT4 Code: 3151761 Description: 60737 - [facility] PHYS LEVEL 4 - EST PT Modifier: Quantity: 1 CPT4 Code: Description: ICD-10 Diagnosis Description L97.821 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

## 2020-07-05 ENCOUNTER — Other Ambulatory Visit: Payer: Self-pay | Admitting: Family Medicine

## 2020-07-09 ENCOUNTER — Other Ambulatory Visit: Payer: Self-pay

## 2020-07-09 ENCOUNTER — Ambulatory Visit (INDEPENDENT_AMBULATORY_CARE_PROVIDER_SITE_OTHER): Payer: Medicare Other

## 2020-07-09 DIAGNOSIS — I4891 Unspecified atrial fibrillation: Secondary | ICD-10-CM

## 2020-07-09 DIAGNOSIS — Z5181 Encounter for therapeutic drug level monitoring: Secondary | ICD-10-CM

## 2020-07-09 LAB — POCT INR: INR: 2.8 (ref 2.0–3.0)

## 2020-07-09 NOTE — Patient Instructions (Signed)
-    continue dosage of warfarin 1/2 tablet every day EXCEPT 1 tablet on MONDAYS & FRIDAYS - recheck INR in 4 weeks

## 2020-07-11 ENCOUNTER — Encounter: Payer: Self-pay | Admitting: Family Medicine

## 2020-07-11 NOTE — Progress Notes (Signed)
JOAHAN, SWATZELL ([id number]) Visit Report for 07/04/2020 Allergy List Details Patient Name: [name], [name] Date of Service: 07/04/2020 1:15 PM Medical Record Number: [id number] Patient Account Number: [id number] Date of Birth/Sex: 08-Jun-1946 (74 y.o. M) Treating RN: Cornell Barman Primary Care Maziah Smola: Ria Bush Other Clinician: Referring Kayen Grabel: Referral, Self Treating Elmon Shader/Extender: Ricard Dillon Weeks in Treatment: 0 Allergies Active Allergies No Known Drug Allergies Allergy Notes Electronic Signature(s) Signed: 07/04/2020 2:14:25 PM By: Darci Needle Entered By: Darci Needle on 07/04/2020 13:53:16 [name] ([id number]) -------------------------------------------------------------------------------- Arrival Information Details Patient Name: [name] Date of Service: 07/04/2020 1:15 PM Medical Record Number: [id number] Patient Account Number: [id number] Date of Birth/Sex: 1946/12/07 (74 y.o. M) Treating RN: Cornell Barman Primary Care Johney Perotti: Ria Bush Other Clinician: Referring Jamaiya Tunnell: Referral, Self Treating Tondra Reierson/Extender: Tito Dine in Treatment: 0 Visit Information Patient Arrived: Ambulatory Arrival Time: 13:24 Accompanied By: self Transfer Assistance: None Patient Identification Verified: Yes Secondary Verification Process Completed: Yes Patient Requires Transmission-Based No Precautions: Patient Has Alerts: Yes Patient Alerts: Patient on Blood Thinner Type II Diabetic History Since Last Visit All ordered tests and consults were completed: No Added or deleted any medications: No Any new allergies or adverse reactions: No Had a fall or experienced change in activities of daily living that may affect risk of falls: No Signs or symptoms of abuse/neglect since last visito No Hospitalized since last visit: No Implantable device outside of the clinic excluding cellular tissue based products placed in the center  since last visit: No Electronic Signature(s) Signed: 07/04/2020 4:56:00 PM By: Gretta Cool, BSN, RN, CWS, Kim RN, BSN Entered By: Gretta Cool, BSN, RN, CWS, Kim on 07/04/2020 14:08:59 [name] ([id number]) -------------------------------------------------------------------------------- Clinic Level of Care Assessment Details Patient Name: [name] Date of Service: 07/04/2020 1:15 PM Medical Record Number: [id number] Patient Account Number: [id number] Date of Birth/Sex: [birth year] (74 y.o. M) Treating RN: Cornell Barman Primary Care Karren Newland: Ria Bush Other Clinician: Referring Nori Poland: Referral, Self Treating Winter Jocelyn/Extender: Tito Dine in Treatment: 0 Clinic Level of Care Assessment Items TOOL 1 Quantity Score []  - Use when EandM and Procedure is performed on INITIAL visit 0 ASSESSMENTS - Nursing Assessment / Reassessment X - General Physical Exam (combine w/ comprehensive assessment (listed just below) when performed on new 1 20 pt. evals) X- 1 25 Comprehensive Assessment (HX, ROS, Risk Assessments, Wounds Hx, etc.) ASSESSMENTS - Wound and Skin Assessment / Reassessment []  - Dermatologic / Skin Assessment (not related to wound area) 0 ASSESSMENTS - Ostomy and/or Continence Assessment and Care []  - Incontinence Assessment and Management 0 []  - 0 Ostomy Care Assessment and Management (repouching, etc.) PROCESS - Coordination of Care X - Simple Patient / Family Education for ongoing care 1 15 []  - 0 Complex (extensive) Patient / Family Education for ongoing care X- 1 10 Staff obtains Programmer, systems, Records, Test Results / Process Orders []  - 0 Staff telephones HHA, Nursing Homes / Clarify orders / etc []  - 0 Routine Transfer to another Facility (non-emergent condition) []  - 0 Routine Hospital Admission (non-emergent condition) X- 1 15 New Admissions / Biomedical engineer / Ordering NPWT, Apligraf, etc. []  - 0 Emergency Hospital Admission (emergent  condition) PROCESS - Special Needs []  - Pediatric / Minor Patient Management 0 []  - 0 Isolation Patient Management []  - 0 Hearing / Language / Visual special needs []  - 0 Assessment of Community assistance (transportation, D/C planning, etc.) []  - 0 Additional assistance / Altered mentation []  - 0 Support Surface(s) Assessment (  bed, cushion, seat, etc.) INTERVENTIONS - Miscellaneous []  - External ear exam 0 []  - 0 Patient Transfer (multiple staff / Civil Service fast streamer / Similar devices) []  - 0 Simple Staple / Suture removal (25 or less) []  - 0 Complex Staple / Suture removal (26 or more) []  - 0 Hypo/Hyperglycemic Management (do not check if billed separately) X- 1 15 Ankle / Brachial Index (ABI) - do not check if billed separately Has the patient been seen at the hospital within the last three years: Yes Total Score: 100 Level Of Care: New/Established - Level 3 [name], [name] ([id number]) Electronic Signature(s) Signed: 07/04/2020 4:56:00 PM By: Gretta Cool, BSN, RN, CWS, Kim RN, BSN Entered By: Gretta Cool, BSN, RN, CWS, Kim on 07/04/2020 14:15:50 [name] ([id number]) -------------------------------------------------------------------------------- Compression Therapy Details Patient Name: [name] Date of Service: 07/04/2020 1:15 PM Medical Record Number: [id number] Patient Account Number: [id number] Date of Birth/Sex: [birth year] (74 y.o. M) Treating RN: Cornell Barman Primary Care Sharmain Lastra: Ria Bush Other Clinician: Referring Golda Zavalza: Referral, Self Treating Glorious Flicker/Extender: Tito Dine in Treatment: 0 Compression Therapy Performed for Wound Assessment: Wound #6 Left,Medial Lower Leg Performed By: Clinician Cornell Barman, RN Compression Type: Three Layer Pre Treatment ABI: 1.3 Post Procedure Diagnosis Same as Pre-procedure Electronic Signature(s) Signed: 07/04/2020 4:56:00 PM By: Gretta Cool, BSN, RN, CWS, Kim RN, BSN Entered By: Gretta Cool, BSN, RN, CWS, Kim on  07/04/2020 14:12:30 [name] ([id number]) -------------------------------------------------------------------------------- Compression Therapy Details Patient Name: [name] Date of Service: 07/04/2020 1:15 PM Medical Record Number: [id number] Patient Account Number: [id number] Date of Birth/Sex: 01-14-1946 (74 y.o. M) Treating RN: Cornell Barman Primary Care Sonji Starkes: Ria Bush Other Clinician: Referring Ellsworth Waldschmidt: Referral, Self Treating Arizbeth Cawthorn/Extender: Tito Dine in Treatment: 0 Compression Therapy Performed for Wound Assessment: Wound #7 Left,Distal,Medial Lower Leg Performed By: Clinician Cornell Barman, RN Compression Type: Three Layer Pre Treatment ABI: 1.3 Post Procedure Diagnosis Same as Pre-procedure Electronic Signature(s) Signed: 07/04/2020 4:56:00 PM By: Gretta Cool, BSN, RN, CWS, Kim RN, BSN Entered By: Gretta Cool, BSN, RN, CWS, Kim on 07/04/2020 14:13:00 [name] ([id number]) -------------------------------------------------------------------------------- Encounter Discharge Information Details Patient Name: [name] Date of Service: 07/04/2020 1:15 PM Medical Record Number: [id number] Patient Account Number: [id number] Date of Birth/Sex: October 17, 1946 (74 y.o. M) Treating RN: Cornell Barman Primary Care Charlina Dwight: Ria Bush Other Clinician: Referring Jaidalyn Schillo: Referral, Self Treating Tyger Oka/Extender: Tito Dine in Treatment: 0 Encounter Discharge Information Items Discharge Condition: Stable Ambulatory Status: Ambulatory Discharge Destination: Home Transportation: Private Auto Accompanied By: self Schedule Follow-up Appointment: Yes Clinical Summary of Care: Electronic Signature(s) Signed: 07/04/2020 4:56:00 PM By: Gretta Cool, BSN, RN, CWS, Kim RN, BSN Entered By: Gretta Cool, BSN, RN, CWS, Kim on 07/04/2020 14:17:04 [name]  ([id number]) -------------------------------------------------------------------------------- Lower Extremity Assessment Details Patient Name: [name] Date of Service: 07/04/2020 1:15 PM Medical Record Number: [id number] Patient Account Number: [id number] Date of Birth/Sex: 27-Nov-1946 (74 y.o. M) Treating RN: Cornell Barman Primary Care Kalden Wanke: Ria Bush Other Clinician: Referring Torin Whisner: Referral, Self Treating Stanley Helmuth/Extender: Tito Dine in Treatment: 0 Edema Assessment Assessed: [Left: No] [Right: No] Edema: [Left: Ye] [Right: s] Calf Left: Right: Point of Measurement: 28 cm From Medial Instep 36.5 cm cm Ankle Left: Right: Point of Measurement: 9 cm From Medial Instep 25 cm cm Vascular Assessment Pulses: Dorsalis Pedis Palpable: [Left:No] Doppler Audible: [Left:Yes] Posterior Tibial Palpable: [Left:No] Doppler Audible: [Left:Yes] Popliteal Doppler Audible: [Left:Yes] Blood Pressure: Brachial: [Left:94] Ankle: [Left:Dorsalis Pedis: 120 1.28] Electronic Signature(s) Signed: 07/04/2020 2:14:25 PM By:  Darci Needle Signed: 07/04/2020 4:56:00 PM By: Gretta Cool, BSN, RN, CWS, Kim RN, BSN Entered By: Darci Needle on 07/04/2020 13:53:06 [name] ([id number]) -------------------------------------------------------------------------------- Multi Wound Chart Details Patient Name: [name] Date of Service: 07/04/2020 1:15 PM Medical Record Number: [id number] Patient Account Number: [id number] Date of Birth/Sex: Nov 20, 1946 (74 y.o. 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] [6:Venous Leg Ulcer] [7:Venous Leg Ulcer] Date Acquired: [5:06/13/2020] [6:07/04/2020] [7:06/20/2020] Weeks of Treatment: [5:0] [6:0] [7:0] Wound Status: [5:Healed - Epithelialized] [6:Open] [7:Open] Clustered Wound: [5:Yes] [6:No] [7:No] Measurements L x W x D (cm) [5:0x0x0] [6:0.9x1.1x0.1] [7:0.4x0.5x0.1] Area (cm) : [5:0] [6:0.778] [7:0.157] Volume (cm) : [5:0] [6:0.078] [7:0.016] % Reduction in Area: [5:100.00%] [6:N/A] [7:N/A] % Reduction in Volume: [5:100.00%] [6:N/A] [7:N/A] Classification: [5:Partial Thickness] [6:N/A] [7:N/A] Exudate Amount: [5:Small] [6:N/A] [7:N/A] Exudate Type: [5:Serosanguineous] [6:N/A] [7:N/A] Exudate Color: [5:red, brown] [6:N/A] [7:N/A] Wound Margin: [5:Flat and Intact] [6:N/A] [7:N/A] Granulation Amount: [5:Large (67-100%)] [6:N/A] [7:N/A] Granulation Quality: [5:Red] [6:N/A] [7:N/A] Necrotic Amount: [5:None Present (0%)] [6:N/A] [7:N/A] Exposed Structures: [5:Fat Layer (Subcutaneous Tissue) Exposed: Yes N/A] [6:N/A Compression Therapy] [7:N/A Compression Therapy] Treatment Notes Wound #6 (Left, Medial Lower Leg) 1. Cleansed with: Cleanse wound with antibacterial soap and water 4. Dressing Applied: Other dressing (specify in notes) 5. Secondary Dressing Applied ABD Pad 7. Secured with 3 Layer Compression System - Left Lower Extremity Notes SIlver cell Wound #7 (Left, Distal, Medial Lower Leg) 1. Cleansed with: Cleanse wound with antibacterial soap and water 4. Dressing Applied: Other dressing (specify in notes) [name], [name]. ([id number]) 5. Secondary Dressing Applied ABD Pad 7. Secured with 3 Layer Compression System - Left Lower Extremity Notes SIlver cell Electronic Signature(s) Signed: 07/04/2020 3:56:44 PM By: Linton Ham  MD Entered By: Linton Ham on 07/04/2020 14:19:48 [name] ([id number]) -------------------------------------------------------------------------------- [place] Details Patient Name: [name] Date of Service: 07/04/2020 1:15 PM Medical Record Number: [id number] Patient Account Number: [id number] Date of Birth/Sex: 1946-02-14 (74 y.o. M) Treating RN: Cornell Barman Primary Care Makhayla Mcmurry: Ria Bush Other Clinician: Referring Reakwon Barren: Referral, Self Treating Lavere Shinsky/Extender: Tito Dine in Treatment: 0 Active Inactive Orientation to the Wound Care Program Nursing Diagnoses: Knowledge deficit related to the wound healing center program Goals: Patient/caregiver will verbalize understanding of the [facility] Program Date Initiated: 07/04/2020 Target Resolution Date: 07/12/2020 Goal Status: Active Interventions: Provide education on orientation to the wound center Notes: Venous Leg Ulcer Nursing Diagnoses: Potential for venous Insuffiency (use before diagnosis confirmed) Goals: Patient/caregiver will verbalize understanding of disease process and disease management Date Initiated: 07/04/2020 Target Resolution Date: 07/12/2020 Goal Status: Active Interventions: Assess peripheral edema status every visit. Treatment Activities: Therapeutic compression applied : 07/04/2020 Notes: Wound/Skin Impairment Nursing Diagnoses: Impaired tissue integrity Goals: Ulcer/skin breakdown will have a volume reduction of 30% by week 4 Date Initiated: 07/04/2020 Target Resolution Date: 08/04/2020 Goal Status: Active Interventions: Assess ulceration(s) every visit Treatment Activities: Topical wound management initiated : 07/04/2020 Notes: Electronic Signature(s) Signed: 07/04/2020 4:56:00 PM By: Gretta Cool, BSN, RN, CWS, Kim RN, BSN Entered By: Gretta Cool, BSN, RN, CWS, Kim on 07/04/2020 14:11:39 Melrose Nakayama  Viona Gilmore ([id number] Plump, Arnette Norris  ([id number]) -------------------------------------------------------------------------------- Pain Assessment Details Patient Name: [name], [name] Date of Service: 07/04/2020 1:15 PM Medical Record Number: [id number] Patient Account Number: [id number] Date of Birth/Sex: 02/10/1946 (74 y.o. M) Treating RN: Cornell Barman Primary Care Jenai Scaletta: Ria Bush Other Clinician: Referring Geetika Laborde: Referral, Self Treating Anastazia Creek/Extender: Tito Dine in Treatment: 0 Active Problems Location of Pain Severity and Description of Pain Patient Has Paino No Site Locations Pain Management and Medication Current Pain Management: Electronic Signature(s) Signed: 07/04/2020 4:56:00 PM By: Gretta Cool, BSN, RN, CWS, Kim RN, BSN Entered By: Gretta Cool, BSN, RN, CWS, Kim on 07/04/2020 14:07:55 [name] ([id number]) -------------------------------------------------------------------------------- Wound Assessment Details Patient Name: [name] Date of Service: 07/04/2020 1:15 PM Medical Record Number: [id number] Patient Account Number: [id number] Date of Birth/Sex: 18-Nov-1946 (74 y.o. M) Treating RN: Cornell Barman Primary Care Crystel Demarco: Ria Bush Other Clinician: Referring Adalynn Corne: Referral, Self Treating Altair Stanko/Extender: Tito Dine in Treatment: 0 Wound Status Wound Number: 5 Primary Etiology: Venous Leg Ulcer Wound Location: Left, Anterior Lower Leg Wound Status: Healed - Epithelialized Wounding Event: Blister Date Acquired: 06/13/2020 Weeks Of Treatment: 0 Clustered Wound: Yes Wound Measurements Length: (cm) 0 Width: (cm) 0 Depth: (cm) 0 Area: (cm) 0 Volume: (cm) 0 % Reduction in Area: 100% % Reduction in Volume: 100% Wound Description Classification: Partial Thickness Wound Margin: Flat and Intact Exudate Amount: Small Exudate Type: Serosanguineous Exudate Color: red, brown Foul Odor After Cleansing: No Wound Bed Granulation Amount: Large  (67-100%) Exposed Structure Granulation Quality: Red Fat Layer (Subcutaneous Tissue) Exposed: Yes Necrotic Amount: None Present (0%) Electronic Signature(s) Signed: 07/04/2020 4:56:00 PM By: Gretta Cool, BSN, RN, CWS, Kim RN, BSN Entered By: Gretta Cool, BSN, RN, CWS, Kim on 07/04/2020 14:11:30 [name] ([id number]) -------------------------------------------------------------------------------- Wound Assessment Details Patient Name: [name] Date of Service: 07/04/2020 1:15 PM Medical Record Number: [id number] Patient Account Number: [id number] Date of Birth/Sex: 09/11/1946 (74 y.o. M) Treating RN: Cornell Barman Primary Care Mame Twombly: Ria Bush Other Clinician: Referring Ferron Ishmael: Referral, Self Treating Angee Gupton/Extender: Tito Dine in Treatment: 0 Wound Status Wound Number: 6 Primary Venous Leg Ulcer Etiology: Wound Location: Left, Medial Lower Leg Wound Open Wounding Event: Blister Status: Date Acquired: 07/04/2020 Comorbid Sleep Apnea, Arrhythmia, Hypertension, Type II Weeks Of Treatment: 0 History: Diabetes, Osteoarthritis, Neuropathy, Received Radiation Clustered Wound: No Photos Wound Measurements Length: (cm) 0.9 Width: (cm) 1.1 Depth: (cm) 0.1 Area: (cm) 0.778 Volume: (cm) 0.078 % Reduction in Area: 0% % Reduction in Volume: 0% Wound Description Classification: Partial Thickness Treatment Notes Wound #6 (Left, Medial Lower Leg) 1. Cleansed with: Cleanse wound with antibacterial soap and water 4. Dressing Applied: Other dressing (specify in notes) 5. Secondary Dressing Applied ABD Pad 7. Secured with 3 Layer Compression System - Left Lower Extremity Notes SIlver cell Electronic Signature(s) Signed: 07/04/2020 4:56:00 PM By: Gretta Cool, BSN, RN, CWS, Kim RN, BSN Signed: 07/11/2020 7:59:37 AM By: Sandre Kitty Previous Signature: 07/04/2020 2:14:25 PM Version By: Darci Needle Entered By: Sandre Kitty on 07/04/2020 15:39:27 [name] ([name] ([id number]) -------------------------------------------------------------------------------- Wound Assessment Details Patient Name: [name] Date of Service: 07/04/2020 1:15 PM Medical Record Number: [id number] Patient Account Number: [id number] Date of Birth/Sex: 03-30-1946 (74 y.o. M) Treating RN: Cornell Barman Primary Care Hyland Mollenkopf: Ria Bush Other Clinician: Referring Massiah Minjares: Referral, Self Treating Omer Monter/Extender: Tito Dine in Treatment: 0 Wound Status Wound Number: 7 Primary Venous Leg Ulcer Etiology: Wound Location: Left, Distal, Medial Lower Leg Wound Open  Wounding Event: Blister Status: Date Acquired: 06/20/2020 Comorbid Sleep Apnea, Arrhythmia, Hypertension, Type II Weeks Of Treatment: 0 History: Diabetes, Osteoarthritis, Neuropathy, Received Radiation Clustered Wound: No Photos Wound Measurements Length: (cm) 0.4 Width: (cm) 0.5 Depth: (cm) 0.1 Area: (cm) 0.157 Volume: (cm) 0.016 % Reduction in Area: 0% % Reduction in Volume: 0% Wound Description Classification: Partial Thickness Treatment Notes Wound #7 (Left, Distal, Medial Lower Leg) 1. Cleansed with: Cleanse wound with antibacterial soap and water 4. Dressing Applied: Other dressing (specify in notes) 5. Secondary Dressing Applied ABD Pad 7. Secured with 3 Layer Compression System - Left Lower Extremity Notes SIlver cell Electronic Signature(s) Signed: 07/04/2020 4:56:00 PM By: Gretta Cool, BSN, RN, CWS, Kim RN, BSN Signed: 07/11/2020 7:59:37 AM By: Sandre Kitty Previous Signature: 07/04/2020 2:14:25 PM Version By: Darci Needle Entered By: Sandre Kitty on 07/04/2020 15:38:52 [name] ([name] ([id number]) -------------------------------------------------------------------------------- Vitals Details Patient Name: [name] Date of Service: 07/04/2020 1:15 PM Medical Record Number:  [id number] Patient Account Number: [id number] Date of Birth/Sex: Oct 21, 1946 (74 y.o. M) Treating RN: Cornell Barman Primary Care Pailyn Bellevue: Ria Bush Other Clinician: Referring Hazeline Charnley: Referral, Self Treating Amani Nodarse/Extender: Tito Dine in Treatment: 0 Vital Signs Time Taken: 01:15 Temperature (F): 97.7 Height (in): 72 Pulse (bpm): 50 Source: Stated Respiratory Rate (breaths/min): 18 Weight (lbs): 223 Blood Pressure (mmHg): 122/55 Source: Stated Reference Range: 80 - 120 mg / dl Body Mass Index (BMI): 30.2 Electronic Signature(s) Signed: 07/04/2020 2:14:25 PM By: Darci Needle Entered By: Darci Needle on 07/04/2020 13:26:29

## 2020-07-12 ENCOUNTER — Other Ambulatory Visit: Payer: Self-pay

## 2020-07-12 ENCOUNTER — Encounter: Payer: Medicare Other | Attending: Physician Assistant

## 2020-07-12 DIAGNOSIS — L97111 Non-pressure chronic ulcer of right thigh limited to breakdown of skin: Secondary | ICD-10-CM | POA: Diagnosis not present

## 2020-07-12 DIAGNOSIS — I10 Essential (primary) hypertension: Secondary | ICD-10-CM | POA: Insufficient documentation

## 2020-07-12 DIAGNOSIS — D509 Iron deficiency anemia, unspecified: Secondary | ICD-10-CM | POA: Insufficient documentation

## 2020-07-12 DIAGNOSIS — E11622 Type 2 diabetes mellitus with other skin ulcer: Secondary | ICD-10-CM | POA: Insufficient documentation

## 2020-07-12 DIAGNOSIS — I480 Paroxysmal atrial fibrillation: Secondary | ICD-10-CM | POA: Diagnosis not present

## 2020-07-12 DIAGNOSIS — Z7901 Long term (current) use of anticoagulants: Secondary | ICD-10-CM | POA: Diagnosis not present

## 2020-07-12 DIAGNOSIS — Z8546 Personal history of malignant neoplasm of prostate: Secondary | ICD-10-CM | POA: Insufficient documentation

## 2020-07-12 DIAGNOSIS — E538 Deficiency of other specified B group vitamins: Secondary | ICD-10-CM | POA: Insufficient documentation

## 2020-07-12 DIAGNOSIS — M1990 Unspecified osteoarthritis, unspecified site: Secondary | ICD-10-CM | POA: Diagnosis not present

## 2020-07-12 DIAGNOSIS — G4730 Sleep apnea, unspecified: Secondary | ICD-10-CM | POA: Insufficient documentation

## 2020-07-12 DIAGNOSIS — L97821 Non-pressure chronic ulcer of other part of left lower leg limited to breakdown of skin: Secondary | ICD-10-CM | POA: Insufficient documentation

## 2020-07-12 DIAGNOSIS — I714 Abdominal aortic aneurysm, without rupture: Secondary | ICD-10-CM | POA: Diagnosis not present

## 2020-07-12 DIAGNOSIS — E1142 Type 2 diabetes mellitus with diabetic polyneuropathy: Secondary | ICD-10-CM | POA: Insufficient documentation

## 2020-07-12 DIAGNOSIS — E785 Hyperlipidemia, unspecified: Secondary | ICD-10-CM | POA: Diagnosis not present

## 2020-07-12 NOTE — Progress Notes (Signed)
JERAN, HILTZ ([id number]) Visit Report for 07/12/2020 Arrival Information Details Patient Name: [name], [name] Date of Service: 07/12/2020 11:00 AM Medical Record Number: [id number] Patient Account Number: [id number] Date of Birth/Sex: [birth year] (74 y.o. M) Treating RN: Grover Canavan Primary Care Bernal Luhman: Ria Bush Other Clinician: Referring Raywood Wailes: Ria Bush Treating Lochlyn Zullo/Extender: Melburn Hake, HOYT Weeks in Treatment: 1 Visit Information History Since Last Visit Added or deleted any medications: No Patient Arrived: Kasandra Knudsen Had a fall or experienced change in No Arrival Time: 10:46 activities of daily living that may affect Accompanied By: self risk of falls: Transfer Assistance: None Hospitalized since last visit: No Patient Requires Transmission-Based No Has Dressing in Place as Prescribed: Yes Precautions: Pain Present Now: No Patient Has Alerts: Yes Patient Alerts: Patient on Blood Thinner Type II Diabetic Electronic Signature(s) Signed: 07/12/2020 11:08:56 AM By: Grover Canavan Entered By: Grover Canavan on 07/12/2020 11:01:52 [name] ([id number]) -------------------------------------------------------------------------------- Clinic Level of Care Assessment Details Patient Name: [name] Date of Service: 07/12/2020 11:00 AM Medical Record Number: [id number] Patient Account Number: [id number] Date of Birth/Sex: 09/07/1946 (74 y.o. M) Treating RN: Grover Canavan Primary Care Edras Wilford: Ria Bush Other Clinician: Referring Angla Delahunt: Ria Bush Treating Roux Brandy/Extender: Melburn Hake, HOYT Weeks in Treatment: 1 Clinic Level of Care Assessment Items TOOL 4 Quantity Score []  - Use when only an EandM is performed on FOLLOW-UP visit 0 ASSESSMENTS - Nursing Assessment / Reassessment X - Reassessment of Co-morbidities (includes updates in patient status) 1 10 X- 1 5 Reassessment of Adherence to Treatment Plan ASSESSMENTS -  Wound and Skin Assessment / Reassessment []  - Simple Wound Assessment / Reassessment - one wound 0 X- 2 5 Complex Wound Assessment / Reassessment - multiple wounds []  - 0 Dermatologic / Skin Assessment (not related to wound area) ASSESSMENTS - Focused Assessment []  - Circumferential Edema Measurements - multi extremities 0 []  - 0 Nutritional Assessment / Counseling / Intervention []  - 0 Lower Extremity Assessment (monofilament, tuning fork, pulses) []  - 0 Peripheral Arterial Disease Assessment (using hand held doppler) ASSESSMENTS - Ostomy and/or Continence Assessment and Care []  - Incontinence Assessment and Management 0 []  - 0 Ostomy Care Assessment and Management (repouching, etc.) PROCESS - Coordination of Care X - Simple Patient / Family Education for ongoing care 1 15 []  - 0 Complex (extensive) Patient / Family Education for ongoing care []  - 0 Staff obtains Programmer, systems, Records, Test Results / Process Orders []  - 0 Staff telephones HHA, Nursing Homes / Clarify orders / etc []  - 0 Routine Transfer to another Facility (non-emergent condition) []  - 0 Routine Hospital Admission (non-emergent condition) []  - 0 New Admissions / Biomedical engineer / Ordering NPWT, Apligraf, etc. []  - 0 Emergency Hospital Admission (emergent condition) X- 1 10 Simple Discharge Coordination []  - 0 Complex (extensive) Discharge Coordination PROCESS - Special Needs []  - Pediatric / Minor Patient Management 0 []  - 0 Isolation Patient Management []  - 0 Hearing / Language / Visual special needs []  - 0 Assessment of Community assistance (transportation, D/C planning, etc.) []  - 0 Additional assistance / Altered mentation []  - 0 Support Surface(s) Assessment (bed, cushion, seat, etc.) INTERVENTIONS - Wound Cleansing / Measurement [place], Arnette Norris ([id number]) []  - 0 Simple Wound Cleansing - one wound X- 2 5 Complex Wound Cleansing - multiple wounds []  - 0 Wound Imaging (photographs -  any number of wounds) []  - 0 Wound Tracing (instead of photographs) []  - 0 Simple Wound Measurement - one wound []  - 0 Complex Wound  Measurement - multiple wounds INTERVENTIONS - Wound Dressings []  - Small Wound Dressing one or multiple wounds 0 []  - 0 Medium Wound Dressing one or multiple wounds []  - 0 Large Wound Dressing one or multiple wounds []  - 0 Application of Medications - topical []  - 0 Application of Medications - injection INTERVENTIONS - Miscellaneous []  - External ear exam 0 []  - 0 Specimen Collection (cultures, biopsies, blood, body fluids, etc.) []  - 0 Specimen(s) / Culture(s) sent or taken to Lab for analysis []  - 0 Patient Transfer (multiple staff / Civil Service fast streamer / Similar devices) []  - 0 Simple Staple / Suture removal (25 or less) []  - 0 Complex Staple / Suture removal (26 or more) []  - 0 Hypo / Hyperglycemic Management (close monitor of Blood Glucose) []  - 0 Ankle / Brachial Index (ABI) - do not check if billed separately []  - 0 Vital Signs Has the patient been seen at the hospital within the last three years: Yes Total Score: 60 Level Of Care: New/Established - Level 2 Electronic Signature(s) Signed: 07/12/2020 11:08:56 AM By: Grover Canavan Entered By: Grover Canavan on 07/12/2020 11:05:50 [name] ([id number]) -------------------------------------------------------------------------------- Encounter Discharge Information Details Patient Name: [name] Date of Service: 07/12/2020 11:00 AM Medical Record Number: [id number] Patient Account Number: [id number] Date of Birth/Sex: 08-Feb-1946 (74 y.o. M) Treating RN: Grover Canavan Primary Care Yaniel Limbaugh: Ria Bush Other Clinician: Referring Ethal Gotay: Ria Bush Treating Shondrea Steinert/Extender: Melburn Hake, HOYT Weeks in Treatment: 1 Encounter Discharge Information Items Discharge Condition: Stable Ambulatory Status: Cane Discharge Destination: Home Transportation: Private  Auto Accompanied By: self Schedule Follow-up Appointment: Yes Clinical Summary of Care: Electronic Signature(s) Signed: 07/12/2020 11:08:56 AM By: Grover Canavan Entered By: Grover Canavan on 07/12/2020 11:05:03 [name] ([id number]) -------------------------------------------------------------------------------- Wound Assessment Details Patient Name: [name] Date of Service: 07/12/2020 11:00 AM Medical Record Number: [id number] Patient Account Number: [id number] Date of Birth/Sex: [birth year] (74 y.o. M) Treating RN: Grover Canavan Primary Care Leonte Horrigan: Ria Bush Other Clinician: Referring Freeda Spivey: Ria Bush Treating Makel Mcmann/Extender: Melburn Hake, HOYT Weeks in Treatment: 1 Wound Status Wound Number: 6 Primary Venous Leg Ulcer Etiology: Wound Location: Left, Medial Lower Leg Wound Open Wounding Event: Blister Status: Date Acquired: 07/04/2020 Comorbid Sleep Apnea, Arrhythmia, Hypertension, Type II Weeks Of Treatment: 1 History: Diabetes, Osteoarthritis, Neuropathy, Received Clustered Wound: No Radiation Wound Measurements Length: (cm) 0.1 Width: (cm) 0.1 Depth: (cm) 0.1 Area: (cm) 0.008 Volume: (cm) 0.001 % Reduction in Area: 99% % Reduction in Volume: 98.7% Epithelialization: Large (67-100%) Tunneling: No Undermining: No Wound Description Classification: Partial Thickness Exudate Amount: None Present Foul Odor After Cleansing: No Slough/Fibrino No Wound Bed Granulation Amount: Large (67-100%) Necrotic Amount: None Present (0%) Treatment Notes Wound #6 (Left, Medial Lower Leg) Electronic Signature(s) Signed: 07/12/2020 11:08:56 AM By: Grover Canavan Entered By: Grover Canavan on 07/12/2020 11:02:54 [name] ([id number]) -------------------------------------------------------------------------------- Wound Assessment Details Patient Name: [name] Date of Service: 07/12/2020 11:00 AM Medical Record Number:  [id number] Patient Account Number: [id number] Date of Birth/Sex: 1946-04-20 (74 y.o. M) Treating RN: Grover Canavan Primary Care Mailin Coglianese: Ria Bush Other Clinician: Referring Girtie Wiersma: Ria Bush Treating Xolani Degracia/Extender: Melburn Hake, HOYT Weeks in Treatment: 1 Wound Status Wound Number: 7 Primary Venous Leg Ulcer Etiology: Wound Location: Left, Distal, Medial Lower Leg Wound Open Wounding Event: Blister Status: Date Acquired: 06/20/2020 Comorbid Sleep Apnea, Arrhythmia, Hypertension, Type II Weeks Of Treatment: 1 History: Diabetes, Osteoarthritis, Neuropathy, Received Clustered Wound: No Radiation Wound Measurements Length: (cm) 0.1 Width: (cm) 0.1 Depth: (cm)  0.1 Area: (cm) 0.008 Volume: (cm) 0.001 % Reduction in Area: 94.9% % Reduction in Volume: 93.8% Epithelialization: Large (67-100%) Tunneling: No Undermining: No Wound Description Classification: Partial Thickness Exudate Amount: None Present Foul Odor After Cleansing: No Slough/Fibrino No Wound Bed Granulation Amount: Large (67-100%) Necrotic Amount: None Present (0%) Treatment Notes Wound #7 (Left, Distal, Medial Lower Leg) Electronic Signature(s) Signed: 07/12/2020 11:08:56 AM By: Grover Canavan Entered By: Grover Canavan on 07/12/2020 11:03:19

## 2020-07-14 ENCOUNTER — Other Ambulatory Visit: Payer: Self-pay | Admitting: Family Medicine

## 2020-07-17 ENCOUNTER — Ambulatory Visit: Payer: Medicare Other | Admitting: Physician Assistant

## 2020-07-18 ENCOUNTER — Other Ambulatory Visit: Payer: Self-pay

## 2020-07-18 ENCOUNTER — Encounter: Payer: Medicare Other | Admitting: Internal Medicine

## 2020-07-18 DIAGNOSIS — E11622 Type 2 diabetes mellitus with other skin ulcer: Secondary | ICD-10-CM | POA: Diagnosis not present

## 2020-07-18 DIAGNOSIS — E785 Hyperlipidemia, unspecified: Secondary | ICD-10-CM | POA: Diagnosis not present

## 2020-07-18 DIAGNOSIS — L97821 Non-pressure chronic ulcer of other part of left lower leg limited to breakdown of skin: Secondary | ICD-10-CM | POA: Diagnosis not present

## 2020-07-18 DIAGNOSIS — I10 Essential (primary) hypertension: Secondary | ICD-10-CM | POA: Diagnosis not present

## 2020-07-18 DIAGNOSIS — L97111 Non-pressure chronic ulcer of right thigh limited to breakdown of skin: Secondary | ICD-10-CM | POA: Diagnosis not present

## 2020-07-18 DIAGNOSIS — E1142 Type 2 diabetes mellitus with diabetic polyneuropathy: Secondary | ICD-10-CM | POA: Diagnosis not present
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
## 2020-07-20 ENCOUNTER — Encounter: Payer: Self-pay | Admitting: Family Medicine

## 2020-07-20 NOTE — Progress Notes (Addendum)
TEIGAN, [name] ([id number]) Visit Report for 07/18/2020 Arrival Information Details Patient Name: [name], [name] Date of Service: 07/18/2020 1:15 PM Medical Record Number: [id number] Patient Account Number: [id number] Date of Birth/Sex: 05/18/1946 (74 y.o. M) Treating RN: Army Melia Primary Care Ugochukwu Chichester: Ria Bush Other Clinician: Referring Mery Guadalupe: Ria Bush Treating Roxane Puerto/Extender: Beverly Gust in Treatment: 2 Visit Information History Since Last Visit Added or deleted any medications: No Patient Arrived: Cane Any new allergies or adverse reactions: No Arrival Time: 13:24 Had a fall or experienced change in No Accompanied By: self activities of daily living that may affect Transfer Assistance: None risk of falls: Patient Identification Verified: Yes Signs or symptoms of abuse/neglect since last visito No Patient Requires Transmission-Based No Hospitalized since last visit: No Precautions: Has Dressing in Place as Prescribed: Yes Patient Has Alerts: Yes Pain Present Now: No Patient Alerts: Patient on Blood Thinner Type II Diabetic Electronic Signature(s) Signed: 07/19/2020 8:31:51 AM By: Army Melia Entered By: Army Melia on 07/18/2020 13:24:26 [name] ([id number]) -------------------------------------------------------------------------------- Lower Extremity Assessment Details Patient Name: [name] Date of Service: 07/18/2020 1:15 PM Medical Record Number: [id number] Patient Account Number: [id number] Date of Birth/Sex: [birth year] (74 y.o. M) Treating RN: Army Melia Primary Care Orlanda Lemmerman: Ria Bush Other Clinician: Referring Corene Resnick: Ria Bush Treating Amran Malter/Extender: Beverly Gust in Treatment: 2 Edema Assessment Assessed: [Left: No] [Right: No] Edema: [Left: No] [Right: No] Vascular Assessment Pulses: Dorsalis Pedis Palpable: [Left:Yes] [Right:Yes] Electronic Signature(s) Signed:  07/19/2020 8:31:51 AM By: Army Melia Entered By: Army Melia on 07/18/2020 13:28:46 [name] ([id number]) -------------------------------------------------------------------------------- Pain Assessment Details Patient Name: [name] Date of Service: 07/18/2020 1:15 PM Medical Record Number: [id number] Patient Account Number: [id number] Date of Birth/Sex: 06-30-1946 (74 y.o. M) Treating RN: Army Melia Primary Care Ozell Juhasz: Ria Bush Other Clinician: Referring Shon Mansouri: Ria Bush Treating Karalina Tift/Extender: Beverly Gust in Treatment: 2 Active Problems Location of Pain Severity and Description of Pain Patient Has Paino No Site Locations Pain Management and Medication Current Pain Management: Electronic Signature(s) Signed: 07/19/2020 8:31:51 AM By: Army Melia Entered By: Army Melia on 07/18/2020 13:25:28 [name] ([id number]) -------------------------------------------------------------------------------- Wound Assessment Details Patient Name: [name] Date of Service: 07/18/2020 1:15 PM Medical Record Number: [id number] Patient Account Number: [id number] Date of Birth/Sex: Apr 17, 1946 (74 y.o. M) Treating RN: Army Melia Primary Care Rayn Enderson: Ria Bush Other Clinician: Referring Kaisey Huseby: Ria Bush Treating Sharde Gover/Extender: Beverly Gust in Treatment: 2 Wound Status Wound Number: 6 Primary Venous Leg Ulcer Etiology: Wound Location: Left, Medial Lower Leg Wound Healed - Epithelialized Wounding Event: Blister Status: Date Acquired: 07/04/2020 Comorbid Sleep Apnea, Arrhythmia, Hypertension, Type II Weeks Of Treatment: 2 History: Diabetes, Osteoarthritis, Neuropathy, Received Clustered Wound: No Radiation Photos Wound Measurements Length: (cm) 0 Width: (cm) 0 Depth: (cm) 0 Area: (cm) 0 Volume: (cm) 0 % Reduction in Area: 100% % Reduction in Volume: 100% Epithelialization: Large  (67-100%) Wound Description Classification: Partial Thickness Exudate Amount: None Present Foul Odor After Cleansing: No Slough/Fibrino No Wound Bed Granulation Amount: None Present (0%) Exposed Structure Necrotic Amount: None Present (0%) Fascia Exposed: No Fat Layer (Subcutaneous Tissue) Exposed: No Tendon Exposed: No Muscle Exposed: No Joint Exposed: No Bone Exposed: No Electronic Signature(s) Signed: 07/19/2020 8:31:51 AM By: Army Melia Entered By: Army Melia on 07/18/2020 13:28:07 [name] ([id number]) -------------------------------------------------------------------------------- Wound Assessment Details Patient Name: [name] Date of Service: 07/18/2020 1:15 PM Medical Record Number: [id number] Patient Account Number: [id number] Date of Birth/Sex: 02-28-1946 (74 y.o. M)  Treating RN: Army Melia Primary Care Laquida Cotrell: Ria Bush Other Clinician: Referring Karlena Luebke: Ria Bush Treating Virlee Stroschein/Extender: Beverly Gust in Treatment: 2 Wound Status Wound Number: 7 Primary Venous Leg Ulcer Etiology: Wound Location: Left, Distal, Medial Lower Leg Wound Healed - Epithelialized Wounding Event: Blister Status: Date Acquired: 06/20/2020 Comorbid Sleep Apnea, Arrhythmia, Hypertension, Type II Weeks Of Treatment: 2 History: Diabetes, Osteoarthritis, Neuropathy, Received Clustered Wound: No Radiation Photos Wound Measurements Length: (cm) 0 Width: (cm) 0 Depth: (cm) 0 Area: (cm) 0 Volume: (cm) 0 % Reduction in Area: 100% % Reduction in Volume: 100% Epithelialization: Large (67-100%) Wound Description Classification: Partial Thickness Exudate Amount: None Present Foul Odor After Cleansing: No Slough/Fibrino No Wound Bed Granulation Amount: None Present (0%) Exposed Structure Necrotic Amount: None Present (0%) Fascia Exposed: No Fat Layer (Subcutaneous Tissue) Exposed: No Tendon Exposed: No Muscle Exposed: No Joint  Exposed: No Bone Exposed: No Electronic Signature(s) Signed: 07/19/2020 8:31:51 AM By: Army Melia Entered By: Army Melia on 07/18/2020 13:28:33 [name] ([id number]) -------------------------------------------------------------------------------- Vitals Details Patient Name: [name] Date of Service: 07/18/2020 1:15 PM Medical Record Number: [id number] Patient Account Number: [id number] Date of Birth/Sex: 1946-10-20 (74 y.o. M) Treating RN: Army Melia Primary Care Jia Dottavio: Ria Bush Other Clinician: Referring Lenette Rau: Ria Bush Treating Valentine Barney/Extender: Beverly Gust in Treatment: 2 Vital Signs Time Taken: 13:24 Temperature (F): 97.7 Height (in): 72 Pulse (bpm): 44 Weight (lbs): 223 Respiratory Rate (breaths/min): 16 Body Mass Index (BMI): 30.2 Blood Pressure (mmHg): 140/54 Reference Range: 80 - 120 mg / dl Electronic Signature(s) Signed: 07/19/2020 8:31:51 AM By: Army Melia Entered By: Army Melia on 07/18/2020 13:25:13

## 2020-07-23 MED ORDER — GABAPENTIN 600 MG PO TABS: 600.0000 mg | ORAL_TABLET | Freq: Three times a day (TID) | ORAL | 6 refills | Status: DC

## 2020-07-24 ENCOUNTER — Other Ambulatory Visit: Payer: Self-pay | Admitting: Cardiovascular Disease

## 2020-07-24 NOTE — Telephone Encounter (Signed)
Please review for refills, thanks   

## 2020-07-25 NOTE — Telephone Encounter (Signed)
Please review for refill. Thanks!  

## 2020-07-27 ENCOUNTER — Other Ambulatory Visit: Payer: Self-pay | Admitting: Family Medicine

## 2020-07-27 DIAGNOSIS — G894 Chronic pain syndrome: Secondary | ICD-10-CM

## 2020-07-28 ENCOUNTER — Other Ambulatory Visit: Payer: Self-pay | Admitting: Cardiovascular Disease

## 2020-07-28 ENCOUNTER — Other Ambulatory Visit: Payer: Self-pay | Admitting: Family Medicine

## 2020-07-30 MED ORDER — HYDROCODONE-ACETAMINOPHEN 10-325 MG PO TABS: 1.0000 | ORAL_TABLET | Freq: Four times a day (QID) | ORAL | 0 refills | Status: DC | PRN

## 2020-07-30 NOTE — Telephone Encounter (Signed)
Name of Medication: Hydrocodone-APAP Name of Pharmacy: [facility] Last Fill or Written Date and Quantity: 7/20/221, #120 Last Office Visit and Type: 06/14/20, worsening rash Next Office Visit and Type: none Last Controlled Substance Agreement Date: 07/06/17 Last UDS: 07/06/17

## 2020-07-30 NOTE — Telephone Encounter (Signed)
Robaxin Last filled:  06/04/20, #180 Last OV:  06/14/20, worsening rash Next OV:  none

## 2020-07-30 NOTE — Telephone Encounter (Signed)
ERx 

## 2020-08-01 ENCOUNTER — Other Ambulatory Visit: Payer: Self-pay | Admitting: Cardiovascular Disease

## 2020-08-03 ENCOUNTER — Other Ambulatory Visit: Payer: Self-pay | Admitting: Radiation Oncology

## 2020-08-06 ENCOUNTER — Other Ambulatory Visit: Payer: Self-pay

## 2020-08-06 ENCOUNTER — Other Ambulatory Visit: Payer: Medicare Other

## 2020-08-06 ENCOUNTER — Ambulatory Visit (INDEPENDENT_AMBULATORY_CARE_PROVIDER_SITE_OTHER): Payer: Medicare Other

## 2020-08-06 DIAGNOSIS — I4891 Unspecified atrial fibrillation: Secondary | ICD-10-CM

## 2020-08-06 DIAGNOSIS — R197 Diarrhea, unspecified: Secondary | ICD-10-CM | POA: Diagnosis not present

## 2020-08-06 DIAGNOSIS — Z5181 Encounter for therapeutic drug level monitoring: Secondary | ICD-10-CM | POA: Diagnosis not present

## 2020-08-06 LAB — POCT INR: INR: 2 (ref 2.0–3.0)

## 2020-08-06 NOTE — Patient Instructions (Signed)
-    continue dosage of warfarin 1/2 tablet every day EXCEPT 1 tablet on MONDAYS & FRIDAYS - recheck INR in 5 weeks

## 2020-08-07 ENCOUNTER — Encounter: Payer: Self-pay | Admitting: Family Medicine

## 2020-08-07 ENCOUNTER — Ambulatory Visit (INDEPENDENT_AMBULATORY_CARE_PROVIDER_SITE_OTHER): Payer: Medicare Other | Admitting: Family Medicine

## 2020-08-07 VITALS — BP 134/60 | HR 49 | Temp 98.0°F | Ht 70.75 in | Wt 231.5 lb

## 2020-08-07 DIAGNOSIS — R197 Diarrhea, unspecified: Secondary | ICD-10-CM | POA: Diagnosis not present

## 2020-08-07 DIAGNOSIS — I4891 Unspecified atrial fibrillation: Secondary | ICD-10-CM | POA: Diagnosis not present

## 2020-08-07 DIAGNOSIS — Z7901 Long term (current) use of anticoagulants: Secondary | ICD-10-CM | POA: Diagnosis not present

## 2020-08-07 DIAGNOSIS — I495 Sick sinus syndrome: Secondary | ICD-10-CM | POA: Diagnosis not present

## 2020-08-07 DIAGNOSIS — R1030 Lower abdominal pain, unspecified: Secondary | ICD-10-CM

## 2020-08-07 DIAGNOSIS — E1140 Type 2 diabetes mellitus with diabetic neuropathy, unspecified: Secondary | ICD-10-CM

## 2020-08-07 DIAGNOSIS — R55 Syncope and collapse: Secondary | ICD-10-CM | POA: Diagnosis not present

## 2020-08-07 DIAGNOSIS — K4090 Unilateral inguinal hernia, without obstruction or gangrene, not specified as recurrent: Secondary | ICD-10-CM | POA: Diagnosis not present

## 2020-08-07 DIAGNOSIS — E1165 Type 2 diabetes mellitus with hyperglycemia: Secondary | ICD-10-CM | POA: Diagnosis not present

## 2020-08-07 DIAGNOSIS — R634 Abnormal weight loss: Secondary | ICD-10-CM | POA: Diagnosis not present

## 2020-08-07 DIAGNOSIS — J449 Chronic obstructive pulmonary disease, unspecified: Secondary | ICD-10-CM

## 2020-08-07 DIAGNOSIS — IMO0002 Reserved for concepts with insufficient information to code with codable children: Secondary | ICD-10-CM

## 2020-08-07 LAB — CBC WITH DIFFERENTIAL/PLATELET
Basophils Absolute: 0 10*3/uL (ref 0.0–0.1)
Basophils Relative: 0.4 % (ref 0.0–3.0)
Eosinophils Absolute: 0.2 10*3/uL (ref 0.0–0.7)
Eosinophils Relative: 2.2 % (ref 0.0–5.0)
HCT: 38.2 % — ABNORMAL LOW (ref 39.0–52.0)
Hemoglobin: 12.6 g/dL — ABNORMAL LOW (ref 13.0–17.0)
Lymphocytes Relative: 18.3 % (ref 12.0–46.0)
Lymphs Abs: 1.4 10*3/uL (ref 0.7–4.0)
MCHC: 33 g/dL (ref 30.0–36.0)
MCV: 94.6 fl (ref 78.0–100.0)
Monocytes Absolute: 0.5 10*3/uL (ref 0.1–1.0)
Monocytes Relative: 6.3 % (ref 3.0–12.0)
Neutro Abs: 5.5 10*3/uL (ref 1.4–7.7)
Neutrophils Relative %: 72.8 % (ref 43.0–77.0)
Platelets: 211 10*3/uL (ref 150.0–400.0)
RBC: 4.03 Mil/uL — ABNORMAL LOW (ref 4.22–5.81)
RDW: 14.5 % (ref 11.5–15.5)
WBC: 7.6 10*3/uL (ref 4.0–10.5)

## 2020-08-07 LAB — COMPREHENSIVE METABOLIC PANEL
ALT: 11 U/L (ref 0–53)
AST: 15 U/L (ref 0–37)
Albumin: 3.7 g/dL (ref 3.5–5.2)
Alkaline Phosphatase: 75 U/L (ref 39–117)
BUN: 16 mg/dL (ref 6–23)
CO2: 31 mEq/L (ref 19–32)
Calcium: 8.8 mg/dL (ref 8.4–10.5)
Chloride: 103 mEq/L (ref 96–112)
Creatinine, Ser: 1.03 mg/dL (ref 0.40–1.50)
GFR: 70.16 mL/min (ref >60.00)
Glucose, Bld: 187 mg/dL — ABNORMAL HIGH (ref 70–99)
Potassium: 4.6 mEq/L (ref 3.5–5.1)
Sodium: 139 mEq/L (ref 135–145)
Total Bilirubin: 0.6 mg/dL (ref 0.2–1.2)
Total Protein: 6.3 g/dL (ref 6.0–8.3)

## 2020-08-07 LAB — POC URINALSYSI DIPSTICK (AUTOMATED)
Blood, UA: NEGATIVE
Glucose, UA: NEGATIVE
Leukocytes, UA: NEGATIVE
Nitrite, UA: NEGATIVE
Protein, UA: POSITIVE — AB
Spec Grav, UA: >=1.03 — AB (ref 1.010–1.025)
Urobilinogen, UA: 1 E.U./dL
pH, UA: 5.5 (ref 5.0–8.0)

## 2020-08-07 NOTE — Assessment & Plan Note (Signed)
He's fully stopped metformin with resolution of diarrhea. Only on glipizide 5mg  daily with meal. reports adequate readings at home.

## 2020-08-07 NOTE — Assessment & Plan Note (Addendum)
9 lb weight gain noted over the past month.

## 2020-08-07 NOTE — Patient Instructions (Addendum)
I think pain may be coming from inguinal hernia.  Labs today Urine test today.  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 If they recommend surgery, you would need to see Dr Rockey Situ for cardiac clearance prior to surgery.

## 2020-08-07 NOTE — Assessment & Plan Note (Signed)
Likely hypotension related. Recent episode not consistent with hypoglycemia. He already holds dilt/lasix when BP runs low. Encouraged good hydration status. Consider compression stockings.

## 2020-08-07 NOTE — Assessment & Plan Note (Signed)
Seems to have resolved off metformin.

## 2020-08-07 NOTE — Assessment & Plan Note (Signed)
Has been recommended, declined implantable monitor
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
## 2020-08-07 NOTE — Assessment & Plan Note (Addendum)
Anticipate progressive RIH causing current lower abdominal pain. Latest imaging (2019) showing bladder and appendix-containing RIH. He agrees to gen surgery evaluation today. Referral placed. Discussed he is high risk due to high comorbidities - would at least need cardiac clearance prior to surgery.  Will also further evaluate with UA and labwork today.

## 2020-08-09 LAB — GASTROINTESTINAL PATHOGEN PANEL PCR
C. difficile Tox A/B, PCR: NOT DETECTED
Campylobacter, PCR: NOT DETECTED
Cryptosporidium, PCR: NOT DETECTED
E coli (ETEC) LT/ST PCR: NOT DETECTED
E coli (STEC) stx1/stx2, PCR: NOT DETECTED
E coli 0157, PCR: NOT DETECTED
Giardia lamblia, PCR: NOT DETECTED
Norovirus, PCR: NOT DETECTED
Rotavirus A, PCR: NOT DETECTED
Salmonella, PCR: NOT DETECTED
Shigella, PCR: NOT DETECTED

## 2020-08-09 LAB — FECAL FAT, QUALITATIVE: FECAL FAT, QUALITATIVE: NORMAL

## 2020-08-15 ENCOUNTER — Ambulatory Visit: Payer: Medicare Other | Admitting: Surgery

## 2020-08-22 ENCOUNTER — Encounter: Payer: Self-pay | Admitting: *Deleted

## 2020-08-24 ENCOUNTER — Other Ambulatory Visit: Payer: Self-pay | Admitting: Family Medicine

## 2020-08-24 DIAGNOSIS — G894 Chronic pain syndrome: Secondary | ICD-10-CM

## 2020-08-24 NOTE — Telephone Encounter (Signed)
Name of Medication: Hydrocodone-APAP Name of Pharmacy: [facility] Last Fill or Written Date and Quantity: 07/30/20, #120 Last Office Visit and Type: 08/07/20, abd pain Next Office Visit and Type: none Last Controlled Substance Agreement Date: 02/03/18 Last UDS: 07/06/17

## 2020-08-27 MED ORDER — HYDROCODONE-ACETAMINOPHEN 10-325 MG PO TABS: 1.0000 | ORAL_TABLET | Freq: Four times a day (QID) | ORAL | 0 refills | Status: DC | PRN

## 2020-08-27 NOTE — Telephone Encounter (Signed)
ERx 

## 2020-09-04 ENCOUNTER — Encounter: Payer: Self-pay | Admitting: Family Medicine

## 2020-09-04 ENCOUNTER — Ambulatory Visit (INDEPENDENT_AMBULATORY_CARE_PROVIDER_SITE_OTHER): Payer: Medicare Other | Admitting: Family Medicine

## 2020-09-04 ENCOUNTER — Other Ambulatory Visit: Payer: Self-pay

## 2020-09-04 VITALS — BP 134/76 | HR 70 | Temp 97.8°F | Ht 70.75 in | Wt 238.2 lb

## 2020-09-04 DIAGNOSIS — L989 Disorder of the skin and subcutaneous tissue, unspecified: Secondary | ICD-10-CM | POA: Diagnosis not present

## 2020-09-04 DIAGNOSIS — E1165 Type 2 diabetes mellitus with hyperglycemia: Secondary | ICD-10-CM | POA: Diagnosis not present

## 2020-09-04 DIAGNOSIS — E1140 Type 2 diabetes mellitus with diabetic neuropathy, unspecified: Secondary | ICD-10-CM | POA: Diagnosis not present

## 2020-09-04 DIAGNOSIS — R42 Dizziness and giddiness: Secondary | ICD-10-CM

## 2020-09-04 DIAGNOSIS — IMO0002 Reserved for concepts with insufficient information to code with codable children: Secondary | ICD-10-CM

## 2020-09-04 LAB — POCT GLYCOSYLATED HEMOGLOBIN (HGB A1C): Hemoglobin A1C: 7.2 % — AB (ref 4.0–5.6)

## 2020-09-04 MED ORDER — DIPHENOXYLATE-ATROPINE 2.5-0.025 MG PO TABS: 1.0000 | ORAL_TABLET | Freq: Two times a day (BID) | ORAL | 1 refills | Status: DC | PRN

## 2020-09-04 MED ORDER — SITAGLIPTIN PHOSPHATE 50 MG PO TABS
50.0000 mg | ORAL_TABLET | Freq: Every day | ORAL | 6 refills | Status: DC
Start: 2020-09-04 — End: 2020-10-22

## 2020-09-04 NOTE — Patient Instructions (Addendum)
A1c today.  Try [place] 50mg  daily for diabetes.  Start wearing compression stockings - prescription provided today.  Back off sodas - including pepsi. Increase water to help dizziness. Use diltiazem sparingly - it may be dropping your blood pressure too low.  Lesion on skin looks like possible wart - may try salicylic acid cream you can get over the counter. Let us know if interested in evaluation by skin doctor.

## 2020-09-04 NOTE — Progress Notes (Signed)
This visit was conducted in person.  BP 134/76 (BP Location: Right Arm, Patient Position: Sitting, Cuff Size: Normal)   Pulse 70   Temp 97.8 F (36.6 C) (Temporal)   Ht 5' 10.75" (1.797 m)   Wt 238 lb 4 oz (108.1 kg)   SpO2 93%   BMI 33.46 kg/m    No data found.  CC: check mole Subjective:    Patient ID: [name], male    DOB: [birth year], 74 y.o.   MRN: [id number]  HPI: [name] is a 74 y.o. male presenting on 09/04/2020 for Nevus (C/o mole in right groin increasing in size. ) and Dizziness (C/o dizziness when standing/walking. )   Wants mole evaluated to R groin - feels it is enlarging. Noted larger last week. Recent fungal infection to groin treating with OTC antifungal cream  Ongoing orthostatic dizziness. No further falls or syncopal episodes. On metoprolol XL 25mg  1/2 tab daily, lasix 20mg  daily PRN fluid, diltiazem 30mg  daily PRN fast heart rate. Regularly takes amiodarone 300mg  daily.   This morning took only diltiazem 30mg  and amiodarone.   Not taking metformin or glipizide due to diarrhea. cbg 2 nights ago was 116. Drinks 2 pepsi's/day. Lab Results  Component Value Date   HGBA1C 7.2 (A) 09/04/2020  Continues smoking 1 ppd.      Relevant past medical, surgical, family and social history reviewed and updated as indicated. Interim medical history since our last visit reviewed. Allergies and medications reviewed and updated. Outpatient Medications Prior to Visit  Medication Sig Dispense Refill  . albuterol (VENTOLIN HFA) 108 (90 Base) MCG/ACT inhaler INHALE 2 PUFFS BY MOUTH EVERY 6 HOURS AS NEEDED FOR WHEEZING OR SHORTNESS OF BREATH 54 g 1  . amiodarone (PACERONE) 200 MG tablet Take 1.5 tablets (300 mg total) by mouth daily. 135 tablet 3  . Cholecalciferol (VITAMIN D) 50 MCG (2000 UT) CAPS Take 1 capsule (2,000 Units total) by mouth daily. 30 capsule   . Coenzyme Q10 (COQ-10 PO) Take 1 tablet by mouth daily.    . Cyanocobalamin (VITAMIN B-12) 500 MCG TABS  Take 500 mcg by mouth daily.    Marland Kitchen diltiazem (CARDIZEM) 30 MG tablet TAKE 1 TABLET BY MOUTH EVERY DAY AS DIRECTED AS NEEDED FOR FAST HEART RATE 30 tablet 4  . fish oil-omega-3 fatty acids 1000 MG capsule Take 2 g by mouth daily.     . furosemide (LASIX) 20 MG tablet TAKE 1 TABLET BY MOUTH EVERY DAY AS NEEDED FOR FLUID RETENTION 90 tablet 0  . gabapentin (NEURONTIN) 600 MG tablet Take 1 tablet (600 mg total) by mouth 3 (three) times daily. 90 tablet 6  . HYDROcodone-acetaminophen (NORCO) 10-325 MG tablet Take 1 tablet by mouth every 6 (six) hours as needed for moderate pain or severe pain. 120 tablet 0  . ipratropium-albuterol (DUONEB) 0.5-2.5 (3) MG/3ML SOLN Take 3 mLs by nebulization every 6 (six) hours as needed. 360 mL 11  . methocarbamol (ROBAXIN) 750 MG tablet TAKE 1 TABLET(750 MG) BY MOUTH TWICE DAILY 180 tablet 0  . metoprolol succinate (TOPROL XL) 25 MG 24 hr tablet Take 0.5 tablets (12.5 mg total) by mouth daily. 45 tablet 3  . nitroGLYCERIN (NITROLINGUAL) 0.4 MG/SPRAY spray USE 1 SPRAY AS DIRECTED EVERY 5 MINUTES AS NEEDED 4.9 g 1  . omeprazole (PRILOSEC) 40 MG capsule Take 1 capsule (40 mg total) by mouth daily. 90 capsule 3  . pravastatin (PRAVACHOL) 20 MG tablet TAKE 1 TABLET(20 MG) BY MOUTH DAILY 90  tablet 3  . silver sulfADIAZINE (SILVADENE) 1 % cream Apply 1 application topically daily. 50 g 0  . tamsulosin (FLOMAX) 0.4 MG CAPS capsule TAKE 1 CAPSULE(0.4 MG) BY MOUTH TWICE DAILY 60 capsule 11  . tiotropium (SPIRIVA HANDIHALER) 18 MCG inhalation capsule Place 1 capsule (18 mcg total) into inhaler and inhale daily. 30 capsule 11  . varenicline (CHANTIX CONTINUING MONTH PAK) 1 MG tablet Take 1 tablet (1 mg total) by mouth 2 (two) times daily. 60 tablet 1  . varenicline (CHANTIX STARTING MONTH PAK) 0.5 MG X 11 & 1 MG X 42 tablet Take one 0.5 mg tablet by mouth once daily for 3 days, then increase to one 0.5 mg tablet twice daily for 4 days, then increase to one 1 mg tablet twice daily. 53  tablet 0  . warfarin (COUMADIN) 5 MG tablet TAKE AS DIRECTED FOR ANTI-COAG CLINIC 180 tablet 0  . diphenoxylate-atropine (LOMOTIL) 2.5-0.025 MG tablet TAKE 1 TABLET BY MOUTH TWICE DAILY AS NEEDED 20 tablet 0  . glipiZIDE (GLUCOTROL) 5 MG tablet Take 1 tablet (5 mg total) by mouth daily before breakfast. 30 tablet 3  . metFORMIN (GLUCOPHAGE-XR) 500 MG 24 hr tablet Take 2 tablets (1,000 mg total) by mouth daily with breakfast. 180 tablet 3   No facility-administered medications prior to visit.     Per HPI unless specifically indicated in ROS section below Review of Systems Objective:  BP 134/76 (BP Location: Right Arm, Patient Position: Sitting, Cuff Size: Normal)   Pulse 70   Temp 97.8 F (36.6 C) (Temporal)   Ht 5' 10.75" (1.797 m)   Wt 238 lb 4 oz (108.1 kg)   SpO2 93%   BMI 33.46 kg/m   Wt Readings from Last 3 Encounters:  09/04/20 238 lb 4 oz (108.1 kg)  08/07/20 231 lb 8 oz (105 kg)  06/14/20 222 lb 5 oz (100.8 kg)      Physical Exam Vitals and nursing note reviewed.  Constitutional:      Appearance: Normal appearance. He is not ill-appearing.  Musculoskeletal:     Right lower leg: No edema.     Left lower leg: No edema.  Skin:    General: Skin is warm and dry.     Findings: Lesion present.          Comments: Verrucous filiform flesh colored lesion to R groin without erythema   Neurological:     Mental Status: He is alert.  Psychiatric:        Mood and Affect: Mood normal.        Behavior: Behavior normal.       Results for orders placed or performed in visit on 09/04/20  POCT glycosylated hemoglobin (Hb A1C)  Result Value Ref Range   Hemoglobin A1C 7.2 (A) 4.0 - 5.6 %   HbA1c POC (<> result, manual entry)     HbA1c, POC (prediabetic range)     HbA1c, POC (controlled diabetic range)     *Note: Due to a large number of results and/or encounters for the requested time period, some results have not been displayed. A complete set of results can be found in  Results Review.   Assessment & Plan:  This visit occurred during the SARS-CoV-2 public health emergency.  Safety protocols were in place, including screening questions prior to the visit, additional usage of staff PPE, and extensive cleaning of exam room while observing appropriate contact time as indicated for disinfecting solutions.   Problem List Items Addressed This  Visit    Type 2 diabetes, uncontrolled, with neuropathy ([place])    Has stopped metformin and glipizide - states both cause diarrhea.  Will trial [place] 50mg  daily. Continue to encourage limiting added sugars especially in sweetened beverages.       Relevant Medications   sitaGLIPtin (JANUVIA) 50 MG tablet   Other Relevant Orders   POCT glycosylated hemoglobin (Hb A1C) (Completed)   Skin lesion    To R groin - anticipate large filiform wart. Consider OTC salicylic acid vs imiquimod. If growing, consider derm evaluation.       Orthostatic dizziness - Primary    Already only on antihypertensives PRN.  Discussed continued limiting their use.  rec compression stockings.           Meds ordered this encounter  Medications  . sitaGLIPtin (JANUVIA) 50 MG tablet    Sig: Take 1 tablet (50 mg total) by mouth daily.    Dispense:  30 tablet    Refill:  6    In place of metformin and glipizide  . diphenoxylate-atropine (LOMOTIL) 2.5-0.025 MG tablet    Sig: Take 1 tablet by mouth 2 (two) times daily as needed.    Dispense:  20 tablet    Refill:  1   Orders Placed This Encounter  Procedures  . POCT glycosylated hemoglobin (Hb A1C)    Patient Instructions  A1c today.  Try [place] 50mg  daily for diabetes.  Start wearing compression stockings - prescription provided today.  Back off sodas - including pepsi. Increase water to help dizziness. Use diltiazem sparingly - it may be dropping your blood pressure too low.  Lesion on skin looks like possible wart - may try salicylic acid cream you can get over the counter. Let us  know if interested in evaluation by skin doctor.    Follow up plan: Return if symptoms worsen or fail to improve.  Ria Bush, MD

## 2020-09-04 NOTE — Assessment & Plan Note (Addendum)
Already only on antihypertensives PRN.  Discussed continued limiting their use.  rec compression stockings.

## 2020-09-06 DIAGNOSIS — L989 Disorder of the skin and subcutaneous tissue, unspecified: Secondary | ICD-10-CM | POA: Insufficient documentation

## 2020-09-06 NOTE — Assessment & Plan Note (Signed)
To R groin - anticipate large filiform wart. Consider OTC salicylic acid vs imiquimod. If growing, consider derm evaluation.

## 2020-09-06 NOTE — Assessment & Plan Note (Addendum)
Has stopped metformin and glipizide - states both cause diarrhea.  Will trial [place] 50mg  daily. Continue to encourage limiting added sugars especially in sweetened beverages.

## 2020-09-10 ENCOUNTER — Ambulatory Visit (INDEPENDENT_AMBULATORY_CARE_PROVIDER_SITE_OTHER): Payer: Medicare Other

## 2020-09-10 ENCOUNTER — Other Ambulatory Visit: Payer: Self-pay

## 2020-09-10 DIAGNOSIS — I4891 Unspecified atrial fibrillation: Secondary | ICD-10-CM | POA: Diagnosis not present

## 2020-09-10 DIAGNOSIS — Z5181 Encounter for therapeutic drug level monitoring: Secondary | ICD-10-CM

## 2020-09-10 LAB — POCT INR: INR: 2.8 (ref 2.0–3.0)
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
## 2020-09-10 NOTE — Patient Instructions (Signed)
-    continue dosage of warfarin 1/2 tablet every day EXCEPT 1 tablet on MONDAYS & FRIDAYS - recheck INR in 6 weeks 

## 2020-09-11 ENCOUNTER — Ambulatory Visit (INDEPENDENT_AMBULATORY_CARE_PROVIDER_SITE_OTHER): Payer: Medicare Other | Admitting: Cardiovascular Disease

## 2020-09-11 ENCOUNTER — Encounter: Payer: Self-pay | Admitting: Cardiovascular Disease

## 2020-09-11 VITALS — BP 120/60 | HR 52 | Ht 72.0 in | Wt 238.0 lb

## 2020-09-11 DIAGNOSIS — I495 Sick sinus syndrome: Secondary | ICD-10-CM | POA: Diagnosis not present

## 2020-09-11 DIAGNOSIS — I10 Essential (primary) hypertension: Secondary | ICD-10-CM

## 2020-09-11 DIAGNOSIS — I2721 Secondary pulmonary arterial hypertension: Secondary | ICD-10-CM | POA: Diagnosis not present

## 2020-09-11 DIAGNOSIS — I4891 Unspecified atrial fibrillation: Secondary | ICD-10-CM

## 2020-09-11 DIAGNOSIS — I77810 Thoracic aortic ectasia: Secondary | ICD-10-CM | POA: Diagnosis not present

## 2020-09-11 DIAGNOSIS — J432 Centrilobular emphysema: Secondary | ICD-10-CM | POA: Diagnosis not present

## 2020-09-11 MED ORDER — METOPROLOL TARTRATE 25 MG PO TABS
25.0000 mg | ORAL_TABLET | Freq: Two times a day (BID) | ORAL | 3 refills | Status: DC
Start: 2020-09-11 — End: 2021-01-03

## 2020-09-11 NOTE — Patient Instructions (Addendum)
Medication Instructions:  Please start metoprolol tartrate 25 mg twice a day Take additional dose as needed for atrial fibrillation  Stop metoprolol succinate  If you need a refill on your cardiac medications before your next appointment, please call your pharmacy.    Lab work: No new labs needed   If you have labs (blood work) drawn today and your tests are completely normal, you will receive your results only by: Marland Kitchen MyChart Message (if you have MyChart) OR . A paper copy in the mail If you have any lab test that is abnormal or we need to change your treatment, we will call you to review the results.   Testing/Procedures: No new testing needed   Follow-Up: At [facility], you and your health needs are our priority.  As part of our continuing mission to provide you with exceptional heart care, we have created designated Provider Care Teams.  These Care Teams include your primary Cardiologist (physician) and Advanced Practice Providers (APPs -  Physician Assistants and Nurse Practitioners) who all work together to provide you with the care you need, when you need it.  . You will need a follow up appointment in 6 months  . Providers on your designated Care Team:   . Murray Hodgkins, NP . Christell Faith, PA-C . Marrianne Mood, PA-C  Any Other Special Instructions Will Be Listed Below (If Applicable).  COVID-19 Vaccine Information can be found at: ShippingScam.co.uk For questions related to vaccine distribution or appointments, please email [email or web address] or call [phone].

## 2020-09-20 ENCOUNTER — Other Ambulatory Visit: Payer: Self-pay | Admitting: Family Medicine

## 2020-09-20 ENCOUNTER — Encounter: Payer: Self-pay | Admitting: *Deleted

## 2020-09-21 NOTE — Telephone Encounter (Signed)
Robaxin Last filled:  07/31/20, #[id number] Last OV:  09/04/20, mole Next OV:  none

## 2020-09-25 ENCOUNTER — Encounter: Payer: Self-pay | Admitting: Pulmonary Disease

## 2020-09-25 ENCOUNTER — Other Ambulatory Visit: Payer: Self-pay

## 2020-09-25 ENCOUNTER — Ambulatory Visit (INDEPENDENT_AMBULATORY_CARE_PROVIDER_SITE_OTHER): Payer: Medicare Other | Admitting: Pulmonary Disease

## 2020-09-25 VITALS — BP 114/76 | HR 60 | Temp 97.3°F | Ht 72.0 in | Wt 238.2 lb

## 2020-09-25 DIAGNOSIS — Z9119 Patient's noncompliance with other medical treatment and regimen: Secondary | ICD-10-CM

## 2020-09-25 DIAGNOSIS — J449 Chronic obstructive pulmonary disease, unspecified: Secondary | ICD-10-CM

## 2020-09-25 DIAGNOSIS — Z91199 Patient's noncompliance with other medical treatment and regimen due to unspecified reason: Secondary | ICD-10-CM

## 2020-09-25 DIAGNOSIS — F17210 Nicotine dependence, cigarettes, uncomplicated: Secondary | ICD-10-CM | POA: Diagnosis not present

## 2020-09-25 DIAGNOSIS — R0600 Dyspnea, unspecified: Secondary | ICD-10-CM

## 2020-09-25 DIAGNOSIS — I480 Paroxysmal atrial fibrillation: Secondary | ICD-10-CM | POA: Diagnosis not present

## 2020-09-25 MED ORDER — ALBUTEROL SULFATE HFA 108 (90 BASE) MCG/ACT IN AERS: 2.0000 | INHALATION_SPRAY | Freq: Four times a day (QID) | RESPIRATORY_TRACT | 11 refills | Status: DC | PRN

## 2020-09-25 MED ORDER — BREZTRI AEROSPHERE 160-9-4.8 MCG/ACT IN AERO: 2.0000 | INHALATION_SPRAY | Freq: Two times a day (BID) | RESPIRATORY_TRACT | 0 refills | Status: AC

## 2020-09-25 NOTE — Patient Instructions (Signed)
We are going to order some breathing tests, these will probably be done around December time.  We are going to get an echocardiogram (heart test) this will evaluate the artery that goes from your heart to the lungs.  We are also going to get an overnight oximetry (oxygen test overnight).  We are giving you a trial of BREZTRI, 2 puffs twice a day take it whether you feel you need it or not this is a maintenance medication should be taken twice a day.  Rinse your mouth well after you use it.  Sent in a prescription for albuterol   We will see you in follow-up in 3 to 4 weeks time call sooner should any new problems arise.
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
## 2020-09-26 ENCOUNTER — Other Ambulatory Visit: Payer: Self-pay | Admitting: Family Medicine

## 2020-09-26 DIAGNOSIS — G894 Chronic pain syndrome: Secondary | ICD-10-CM

## 2020-09-26 MED ORDER — HYDROCODONE-ACETAMINOPHEN 10-325 MG PO TABS: 1.0000 | ORAL_TABLET | Freq: Four times a day (QID) | ORAL | 0 refills | Status: DC | PRN

## 2020-09-26 NOTE — Telephone Encounter (Signed)
Name of Medication: Hydrocodone-APAP Name of Pharmacy: [facility] Last Fill or Written Date and Quantity: 08/27/20, #120 Last Office Visit and Type: 09/04/20, mole Next Office Visit and Type: none w/Dr. Darnell Level;  09/27/20 w/ [place] Last Controlled Substance Agreement Date: 02/03/18 Last UDS: 07/06/17

## 2020-09-26 NOTE — Telephone Encounter (Signed)
ERx 

## 2020-09-27 ENCOUNTER — Ambulatory Visit: Payer: Medicare Other | Admitting: Family Medicine

## 2020-10-03 ENCOUNTER — Encounter: Payer: Self-pay | Admitting: Pulmonary Disease

## 2020-10-03 ENCOUNTER — Ambulatory Visit: Payer: Medicare Other | Admitting: Family Medicine

## 2020-10-04 DIAGNOSIS — R0600 Dyspnea, unspecified: Secondary | ICD-10-CM | POA: Diagnosis not present

## 2020-10-05 ENCOUNTER — Telehealth: Payer: Self-pay

## 2020-10-05 DIAGNOSIS — J449 Chronic obstructive pulmonary disease, unspecified: Secondary | ICD-10-CM

## 2020-10-05 DIAGNOSIS — Z23 Encounter for immunization: Secondary | ICD-10-CM | POA: Diagnosis not present

## 2020-10-05 MED ORDER — BREZTRI AEROSPHERE 160-9-4.8 MCG/ACT IN AERO: 2.0000 | INHALATION_SPRAY | Freq: Two times a day (BID) | RESPIRATORY_TRACT | 3 refills | Status: DC

## 2020-10-05 NOTE — Telephone Encounter (Signed)
ONO has been reviewed by Dr. Patsey Berthold-- recommend 2L at night.   Both patient and patient's spouse, [name](DPR) are aware of results and voiced their understanding.  Patient is agreeable with nighttime oxyegn. Order has been placed. Patient is also requesting Rx for breztri, as he felt this medication was effective.  Rx has been sent to preferred pharmacy. Nothing further needed.

## 2020-10-12 ENCOUNTER — Ambulatory Visit (INDEPENDENT_AMBULATORY_CARE_PROVIDER_SITE_OTHER): Payer: Medicare Other

## 2020-10-12 ENCOUNTER — Other Ambulatory Visit: Payer: Self-pay

## 2020-10-12 ENCOUNTER — Telehealth: Payer: Self-pay | Admitting: Pulmonary Disease

## 2020-10-12 DIAGNOSIS — R0600 Dyspnea, unspecified: Secondary | ICD-10-CM

## 2020-10-12 MED ORDER — BREZTRI AEROSPHERE 160-9-4.8 MCG/ACT IN AERO: 2.0000 | INHALATION_SPRAY | Freq: Two times a day (BID) | RESPIRATORY_TRACT | 0 refills | Status: DC

## 2020-10-12 NOTE — Telephone Encounter (Signed)
2 samples of Breztri have been placed up front for pickup. Patient stated breztri is not affordable.  I have recommended that patient contact insurance for a list of covered alternatives.  Patient will call back with list.  Patient is aware and voiced his understanding.  Nothing further needed.

## 2020-10-15 LAB — ECHOCARDIOGRAM COMPLETE
AR max vel: 3.16 cm2
AV Area VTI: 3.76 cm2
AV Area mean vel: 3.4 cm2
AV Mean grad: 2 mmHg
AV Peak grad: 4.2 mmHg
Ao pk vel: 1.02 m/s
Calc EF: 43.8 %
S' Lateral: 4 cm
Single Plane A2C EF: 38.7 %
Single Plane A4C EF: 45.3 %

## 2020-10-17 ENCOUNTER — Encounter: Payer: Self-pay | Admitting: Cardiovascular Disease

## 2020-10-17 NOTE — Telephone Encounter (Signed)
This encounter was created in error - please disregard.
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
## 2020-10-22 ENCOUNTER — Other Ambulatory Visit: Payer: Self-pay

## 2020-10-22 ENCOUNTER — Ambulatory Visit (INDEPENDENT_AMBULATORY_CARE_PROVIDER_SITE_OTHER): Payer: Medicare Other | Admitting: Cardiovascular Disease

## 2020-10-22 ENCOUNTER — Encounter: Payer: Self-pay | Admitting: Cardiovascular Disease

## 2020-10-22 ENCOUNTER — Ambulatory Visit (INDEPENDENT_AMBULATORY_CARE_PROVIDER_SITE_OTHER): Payer: Medicare Other

## 2020-10-22 VITALS — BP 106/56 | HR 57 | Ht 72.0 in | Wt 237.0 lb

## 2020-10-22 DIAGNOSIS — I495 Sick sinus syndrome: Secondary | ICD-10-CM | POA: Diagnosis not present

## 2020-10-22 DIAGNOSIS — E782 Mixed hyperlipidemia: Secondary | ICD-10-CM | POA: Diagnosis not present

## 2020-10-22 DIAGNOSIS — Z5181 Encounter for therapeutic drug level monitoring: Secondary | ICD-10-CM

## 2020-10-22 DIAGNOSIS — I4891 Unspecified atrial fibrillation: Secondary | ICD-10-CM

## 2020-10-22 DIAGNOSIS — I77810 Thoracic aortic ectasia: Secondary | ICD-10-CM | POA: Diagnosis not present

## 2020-10-22 DIAGNOSIS — I4811 Longstanding persistent atrial fibrillation: Secondary | ICD-10-CM | POA: Diagnosis not present

## 2020-10-22 DIAGNOSIS — R0600 Dyspnea, unspecified: Secondary | ICD-10-CM

## 2020-10-22 DIAGNOSIS — J432 Centrilobular emphysema: Secondary | ICD-10-CM | POA: Diagnosis not present

## 2020-10-22 DIAGNOSIS — I2721 Secondary pulmonary arterial hypertension: Secondary | ICD-10-CM

## 2020-10-22 DIAGNOSIS — I10 Essential (primary) hypertension: Secondary | ICD-10-CM | POA: Diagnosis not present

## 2020-10-22 LAB — POCT INR: INR: 2.6 (ref 2.0–3.0)

## 2020-10-22 NOTE — Patient Instructions (Signed)
-    continue dosage of warfarin 1/2 tablet every day EXCEPT 1 tablet on MONDAYS & FRIDAYS - recheck INR in 6 weeks

## 2020-10-22 NOTE — Patient Instructions (Signed)
Medication Instructions:  No changes  If you need a refill on your cardiac medications before your next appointment, please call your pharmacy.    Lab work: No new labs needed   If you have labs (blood work) drawn today and your tests are completely normal, you will receive your results only by: . MyChart Message (if you have MyChart) OR . A paper copy in the mail If you have any lab test that is abnormal or we need to change your treatment, we will call you to review the results.   Testing/Procedures: No new testing needed   Follow-Up: At [facility], you and your health needs are our priority.  As part of our continuing mission to provide you with exceptional heart care, we have created designated Provider Care Teams.  These Care Teams include your primary Cardiologist (physician) and Advanced Practice Providers (APPs -  Physician Assistants and Nurse Practitioners) who all work together to provide you with the care you need, when you need it.  . You will need a follow up appointment in 6 months  . Providers on your designated Care Team:   . Christopher Berge, NP . Ryan Dunn, PA-C . Jacquelyn Visser, PA-C  Any Other Special Instructions Will Be Listed Below (If Applicable).  COVID-19 Vaccine Information can be found at: [email or web address] For questions related to vaccine distribution or appointments, please email [email or web address] or call [phone].     

## 2020-10-23 ENCOUNTER — Ambulatory Visit (INDEPENDENT_AMBULATORY_CARE_PROVIDER_SITE_OTHER): Payer: Medicare Other | Admitting: Pulmonary Disease

## 2020-10-23 ENCOUNTER — Encounter: Payer: Self-pay | Admitting: Pulmonary Disease

## 2020-10-23 VITALS — BP 122/68 | HR 50 | Temp 97.7°F | Ht 72.0 in | Wt 235.4 lb

## 2020-10-23 DIAGNOSIS — J449 Chronic obstructive pulmonary disease, unspecified: Secondary | ICD-10-CM

## 2020-10-23 DIAGNOSIS — F17210 Nicotine dependence, cigarettes, uncomplicated: Secondary | ICD-10-CM

## 2020-10-23 DIAGNOSIS — G4736 Sleep related hypoventilation in conditions classified elsewhere: Secondary | ICD-10-CM

## 2020-10-23 DIAGNOSIS — Z91199 Patient's noncompliance with other medical treatment and regimen due to unspecified reason: Secondary | ICD-10-CM

## 2020-10-23 DIAGNOSIS — I480 Paroxysmal atrial fibrillation: Secondary | ICD-10-CM

## 2020-10-23 DIAGNOSIS — J439 Emphysema, unspecified: Secondary | ICD-10-CM | POA: Diagnosis not present

## 2020-10-23 DIAGNOSIS — Z9119 Patient's noncompliance with other medical treatment and regimen: Secondary | ICD-10-CM | POA: Diagnosis not present

## 2020-10-23 MED ORDER — BREZTRI AEROSPHERE 160-9-4.8 MCG/ACT IN AERO: 2.0000 | INHALATION_SPRAY | Freq: Two times a day (BID) | RESPIRATORY_TRACT | 0 refills | Status: DC

## 2020-10-23 NOTE — Patient Instructions (Addendum)
Continue taking Breztri 2 puffs twice a day.  You have received samples of Breztri today.  We will see you in follow-up in 3 months time call sooner should any new problems arise.  Have the oxygen concentrator removed from your home at your request.  It is highly recommended that you stop smoking.
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
## 2020-10-25 ENCOUNTER — Ambulatory Visit: Payer: Medicare Other

## 2020-11-01 ENCOUNTER — Other Ambulatory Visit: Payer: Self-pay | Admitting: Family Medicine

## 2020-11-01 DIAGNOSIS — G894 Chronic pain syndrome: Secondary | ICD-10-CM

## 2020-11-04 NOTE — Telephone Encounter (Signed)
Name of Medication: Hydrocodone-APAP Name of Pharmacy: [facility] Last Fill or Written Date and Quantity: 09/26/20, #120 Last Office Visit and Type: 09/04/20, mole Next Office Visit and Type: none w/Dr. Darnell Level;  09/27/20 w/ [place] Last Controlled Substance Agreement Date: 02/03/18 Last UDS: 07/06/17

## 2020-11-05 MED ORDER — HYDROCODONE-ACETAMINOPHEN 10-325 MG PO TABS: 1.0000 | ORAL_TABLET | Freq: Four times a day (QID) | ORAL | 0 refills | Status: DC | PRN

## 2020-11-05 NOTE — Telephone Encounter (Signed)
ERx 

## 2020-11-06 MED ORDER — HYDROCODONE-ACETAMINOPHEN 10-325 MG PO TABS: 1.0000 | ORAL_TABLET | Freq: Four times a day (QID) | ORAL | 0 refills | Status: DC | PRN

## 2020-11-06 NOTE — Addendum Note (Signed)
Addended by: Ria Bush on: 11/06/2020 07:22 AM   Modules accepted: Orders

## 2020-11-06 NOTE — Addendum Note (Signed)
Addended by: Ria Bush on: 11/06/2020 07:21 AM   Modules accepted: Orders

## 2020-11-06 NOTE — Telephone Encounter (Addendum)
Received notice about ERx fail to [facility].  I've resent.  Can we call to see if they received this time?  [place] CSRS reviewed

## 2020-11-16 ENCOUNTER — Other Ambulatory Visit: Payer: Self-pay | Admitting: Family Medicine

## 2020-11-19 NOTE — Telephone Encounter (Signed)
Robaxin Last filled:  09/24/20, #180 Last OV:  9/28/212, mole Next OV:  none

## 2020-11-30 ENCOUNTER — Other Ambulatory Visit: Payer: Self-pay | Admitting: Family Medicine

## 2020-11-30 DIAGNOSIS — G894 Chronic pain syndrome: Secondary | ICD-10-CM

## 2020-12-03 ENCOUNTER — Other Ambulatory Visit: Payer: Self-pay

## 2020-12-03 ENCOUNTER — Ambulatory Visit (INDEPENDENT_AMBULATORY_CARE_PROVIDER_SITE_OTHER): Payer: Medicare Other

## 2020-12-03 DIAGNOSIS — Z5181 Encounter for therapeutic drug level monitoring: Secondary | ICD-10-CM | POA: Diagnosis not present

## 2020-12-03 DIAGNOSIS — I4891 Unspecified atrial fibrillation: Secondary | ICD-10-CM | POA: Diagnosis not present

## 2020-12-03 LAB — POCT INR: INR: 2.8 (ref 2.0–3.0)

## 2020-12-03 MED ORDER — HYDROCODONE-ACETAMINOPHEN 10-325 MG PO TABS: 1.0000 | ORAL_TABLET | Freq: Four times a day (QID) | ORAL | 0 refills | Status: DC | PRN

## 2020-12-03 NOTE — Telephone Encounter (Signed)
ERx 

## 2020-12-03 NOTE — Telephone Encounter (Signed)
LAST APPOINTMENT DATE: 09/04/2020   NEXT APPOINTMENT DATE: Visit date not found    LAST REFILL: 11/06/2020  QTY: 120 no r/f

## 2020-12-03 NOTE — Patient Instructions (Signed)
-    continue dosage of warfarin 1/2 tablet every day EXCEPT 1 tablet on MONDAYS & FRIDAYS - recheck INR in 6 weeks 

## 2020-12-06 DIAGNOSIS — I716 Thoracoabdominal aortic aneurysm, without rupture: Secondary | ICD-10-CM | POA: Diagnosis not present

## 2020-12-06 DIAGNOSIS — Z09 Encounter for follow-up examination after completed treatment for conditions other than malignant neoplasm: Secondary | ICD-10-CM | POA: Diagnosis not present

## 2020-12-06 DIAGNOSIS — Z006 Encounter for examination for normal comparison and control in clinical research program: Secondary | ICD-10-CM | POA: Diagnosis not present

## 2020-12-06 DIAGNOSIS — Z95828 Presence of other vascular implants and grafts: Secondary | ICD-10-CM | POA: Diagnosis not present

## 2020-12-06 DIAGNOSIS — F17210 Nicotine dependence, cigarettes, uncomplicated: Secondary | ICD-10-CM | POA: Diagnosis not present

## 2020-12-06 DIAGNOSIS — I723 Aneurysm of iliac artery: Secondary | ICD-10-CM | POA: Diagnosis not present

## 2020-12-06 DIAGNOSIS — E785 Hyperlipidemia, unspecified: Secondary | ICD-10-CM | POA: Diagnosis not present

## 2020-12-06 DIAGNOSIS — J90 Pleural effusion, not elsewhere classified: Secondary | ICD-10-CM | POA: Diagnosis not present

## 2020-12-06 DIAGNOSIS — Z9049 Acquired absence of other specified parts of digestive tract: Secondary | ICD-10-CM | POA: Diagnosis not present

## 2020-12-17 ENCOUNTER — Encounter: Payer: Self-pay | Admitting: Surgery

## 2020-12-17 ENCOUNTER — Ambulatory Visit (INDEPENDENT_AMBULATORY_CARE_PROVIDER_SITE_OTHER): Payer: Medicare Other | Admitting: Surgery

## 2020-12-17 ENCOUNTER — Other Ambulatory Visit: Payer: Self-pay

## 2020-12-17 VITALS — BP 145/77 | HR 82 | Temp 98.8°F | Wt 242.8 lb

## 2020-12-17 DIAGNOSIS — K4020 Bilateral inguinal hernia, without obstruction or gangrene, not specified as recurrent: Secondary | ICD-10-CM

## 2020-12-17 NOTE — Patient Instructions (Addendum)
We have placed a referral to [facility] with a general surgeon for evaluation of hernia. They will call you with an appointment. If you have any concerns or questions, please feel free to call our office. Go to Emergency room is right groin starts hurting extremely or if its bulging out.     Inguinal Hernia, Adult An inguinal hernia is when fat or your intestines push through a weak spot in a muscle where your leg meets your lower belly (groin). This causes a bulge. This kind of hernia could also be:  In your scrotum, if you are male.  In folds of skin around your vagina, if you are male. There are three types of inguinal hernias:  Hernias that can be pushed back into the belly (are reducible). This type rarely causes pain.  Hernias that cannot be pushed back into the belly (are incarcerated).  Hernias that cannot be pushed back into the belly and lose their blood supply (are strangulated). This type needs emergency surgery. What are the causes? This condition is caused by having a weak spot in the muscles or tissues in your groin. This develops over time. The hernia may poke through the weak spot when you strain your lower belly muscles all of a sudden, such as when you:  Lift a heavy object.  Strain to poop (have a bowel movement). Trouble pooping (constipation) can lead to straining.  Cough. What increases the risk? This condition is more likely to develop in:  Males.  Pregnant females.  People who: ? Are overweight. ? Work in jobs that require long periods of standing or heavy lifting. ? Have had an inguinal hernia before. ? Smoke or have lung disease. These factors can lead to long-term (chronic) coughing. What are the signs or symptoms? Symptoms may depend on the size of the hernia. Often, a small hernia has no symptoms. Symptoms of a larger hernia may include:  A bulge in the groin area. This is easier to see when standing. You might not be able to see it when  you are lying down.  Pain or burning in the groin. This may get worse when you lift, strain, or cough.  A dull ache or a feeling of pressure in the groin.  An abnormal bulge in the scrotum, in males. Symptoms of a strangulated inguinal hernia may include:  A bulge in your groin that is very painful and tender to the touch.  A bulge that turns red or purple.  Fever, feeling like you may vomit (nausea), and vomiting.  Not being able to poop or to pass gas. How is this treated? Treatment depends on the size of your hernia and whether you have symptoms. If you do not have symptoms, your doctor may have you watch your hernia carefully and have you come in for follow-up visits. If your hernia is large or if you have symptoms, you may need surgery to repair the hernia. Follow these instructions at home: Lifestyle  Avoid lifting heavy objects.  Avoid standing for long amounts of time.  Do not smoke or use any products that contain nicotine or tobacco. If you need help quitting, ask your doctor.  Stay at a healthy weight. Prevent trouble pooping You may need to take these actions to prevent or treat trouble pooping:  Drink enough fluid to keep your pee (urine) pale yellow.  Take over-the-counter or prescription medicines.  Eat foods that are high in fiber. These include beans, whole grains, and fresh fruits  and vegetables.  Limit foods that are high in fat and sugar. These include fried or sweet foods. General instructions  You may try to push your hernia back in place by very gently pressing on it when you are lying down. Do not try to push the bulge back in if it will not go in easily.  Watch your hernia for any changes in shape, size, or color. Tell your doctor if you see any changes.  Take over-the-counter and prescription medicines only as told by your doctor.  Keep all follow-up visits. Contact a doctor if:  You have a fever or chills.  You have new symptoms.  Your  symptoms get worse. Get help right away if:  You have pain in your groin that gets worse all of a sudden.  You have a bulge in your groin that: ? Gets bigger all of a sudden, and it does not get smaller after that. ? Turns red or purple. ? Is painful when you touch it.  You are a male, and you have: ? Sudden pain in your scrotum. ? A sudden change in the size of your scrotum.  You cannot push the hernia back in place by very gently pressing on it when you are lying down.  You feel like you may vomit, and that feeling does not go away.  You keep vomiting.  You have a fast heartbeat.  You cannot poop or pass gas. These symptoms may be an emergency. Get help right away. Call your local emergency services (911 in the [place]  Do not wait to see if the symptoms will go away.  Do not drive yourself to the hospital. Summary  An inguinal hernia is when fat or your intestines push through a weak spot in a muscle where your leg meets your lower belly (groin). This causes a bulge.  If you do not have symptoms, you may not need treatment. If you have symptoms or a large hernia, you may need surgery.  Avoid lifting heavy objects. Also, avoid standing for long amounts of time.  Do not try to push the bulge back in if it will not go in easily. This information is not intended to replace advice given to you by your health care provider. Make sure you discuss any questions you have with your health care provider. Document Revised: 07/24/2020 Document Reviewed: 07/24/2020 Elsevier Patient Education  2021 [employer].
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
## 2020-12-18 ENCOUNTER — Telehealth: Payer: Self-pay

## 2020-12-18 NOTE — Telephone Encounter (Signed)
Referral has been faxed to [facility] at [phone]

## 2021-01-02 ENCOUNTER — Telehealth: Payer: Self-pay

## 2021-01-02 NOTE — Telephone Encounter (Signed)
Pt calls reporting several syncope episodes over the last wk. Once at [employer] store, [employer], [place], and then again today at [employer]. Pt c/o medication for Afib also causing hypotension with drops into the 80s over 50s. Pt reports as he was walking into [employer] today he had a syncope event and had an injury to his R rib area. He reports he has had this problems with his medications in the past and then explained which cardiac meds he is and is not taking. He reports he only takes what helps him and he does not think all of his meds help him. Advised he needs to f/u with cardiology concerning his cardiac meds and hypotension. Pt reports R rib injury under breast which hurts when he turns his body or his neck. Also causes pain when taking a deep breath but not with normal breathing. Denies hemoptysis. Discussed with Dr. Darnell Level who advised to schedule pt with another provider in office tomorrow. Scheduled pt with Dr. Silvio Pate tomorrow. Advised pt if any symptoms worsen or he has any difficulty breathing or coughs up any blood to call 911. Pt verbalized understanding.

## 2021-01-03 ENCOUNTER — Other Ambulatory Visit: Payer: Self-pay | Admitting: Internal Medicine

## 2021-01-03 ENCOUNTER — Ambulatory Visit (INDEPENDENT_AMBULATORY_CARE_PROVIDER_SITE_OTHER)
Admission: RE | Admit: 2021-01-03 | Discharge: 2021-01-03 | Disposition: A | Discharge status: Home / Self Care | Payer: Medicare Other | Source: Ambulatory Visit | Attending: Internal Medicine | Admitting: Internal Medicine

## 2021-01-03 ENCOUNTER — Other Ambulatory Visit: Payer: Self-pay

## 2021-01-03 ENCOUNTER — Encounter: Payer: Self-pay | Admitting: Internal Medicine

## 2021-01-03 ENCOUNTER — Ambulatory Visit (INDEPENDENT_AMBULATORY_CARE_PROVIDER_SITE_OTHER): Payer: Medicare Other | Admitting: Internal Medicine

## 2021-01-03 ENCOUNTER — Other Ambulatory Visit: Payer: Self-pay | Admitting: Family Medicine

## 2021-01-03 VITALS — BP 134/80 | HR 40 | Temp 96.5°F | Ht 72.0 in | Wt 238.0 lb

## 2021-01-03 DIAGNOSIS — R0781 Pleurodynia: Secondary | ICD-10-CM

## 2021-01-03 DIAGNOSIS — I495 Sick sinus syndrome: Secondary | ICD-10-CM | POA: Diagnosis not present

## 2021-01-03 DIAGNOSIS — M25552 Pain in left hip: Secondary | ICD-10-CM

## 2021-01-03 DIAGNOSIS — I951 Orthostatic hypotension: Secondary | ICD-10-CM | POA: Insufficient documentation

## 2021-01-03 DIAGNOSIS — G894 Chronic pain syndrome: Secondary | ICD-10-CM

## 2021-01-03 NOTE — Assessment & Plan Note (Addendum)
Will check x-ray May have fracture but not obvious on exam X-ray negative for fracture--discussed it is just a bruise Left hip also checked and was normal
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
## 2021-01-03 NOTE — Assessment & Plan Note (Addendum)
Sig bradycardia---no increase in HR despite fall in BP to 60 systolic (this could be helped by pacemaker--but he got upset and vehemently swore he would not get any anaesthesia)

## 2021-01-03 NOTE — Assessment & Plan Note (Addendum)
Has clear positional dizziness with multiple falls He states he doesn't take the metoprolol regularly--and no other regular BP meds May be autonomic neuropathy--might benefit from Rx for this (will send my note to Dr Gollan)==like fludrocortisone or midodrine

## 2021-01-03 NOTE — Telephone Encounter (Signed)
Name of Medication: Hydrocodone-APAP Name of Pharmacy: [facility] Last Fill or Written Date and Quantity: 12/03/20, #120 Last Office Visit and Type: 09/04/20, orthostatic dizziness Next Office Visit and Type: none Last Controlled Substance Agreement Date: 02/03/18 Last UDS: 07/06/17, scanned

## 2021-01-03 NOTE — Telephone Encounter (Signed)
Okay I will certainly review his meds, etc

## 2021-01-04 ENCOUNTER — Encounter: Payer: Self-pay | Admitting: Family Medicine

## 2021-01-04 MED ORDER — HYDROCODONE-ACETAMINOPHEN 10-325 MG PO TABS: 1.0000 | ORAL_TABLET | Freq: Four times a day (QID) | ORAL | 0 refills | Status: DC | PRN

## 2021-01-04 NOTE — Telephone Encounter (Signed)
ERx 

## 2021-01-04 NOTE — Telephone Encounter (Signed)
Wanted a phone call , he wants to talk about the doctor he saw yesterday and medication , hes in pain when cough, sneeze.

## 2021-01-14 ENCOUNTER — Ambulatory Visit: Payer: Medicare Other | Admitting: Cardiovascular Disease

## 2021-01-15 ENCOUNTER — Other Ambulatory Visit: Payer: Self-pay

## 2021-01-15 ENCOUNTER — Telehealth: Payer: Self-pay

## 2021-01-15 ENCOUNTER — Encounter: Payer: Self-pay | Admitting: Family Medicine

## 2021-01-15 ENCOUNTER — Ambulatory Visit (INDEPENDENT_AMBULATORY_CARE_PROVIDER_SITE_OTHER): Payer: Medicare Other | Admitting: Family Medicine

## 2021-01-15 VITALS — BP 106/66 | HR 70 | Temp 97.7°F | Ht 72.0 in | Wt 238.5 lb

## 2021-01-15 DIAGNOSIS — R55 Syncope and collapse: Secondary | ICD-10-CM | POA: Diagnosis not present

## 2021-01-15 DIAGNOSIS — E1165 Type 2 diabetes mellitus with hyperglycemia: Secondary | ICD-10-CM | POA: Diagnosis not present

## 2021-01-15 DIAGNOSIS — I10 Essential (primary) hypertension: Secondary | ICD-10-CM | POA: Diagnosis not present

## 2021-01-15 DIAGNOSIS — G8929 Other chronic pain: Secondary | ICD-10-CM | POA: Diagnosis not present

## 2021-01-15 DIAGNOSIS — I951 Orthostatic hypotension: Secondary | ICD-10-CM

## 2021-01-15 DIAGNOSIS — C61 Malignant neoplasm of prostate: Secondary | ICD-10-CM | POA: Diagnosis not present

## 2021-01-15 DIAGNOSIS — E1140 Type 2 diabetes mellitus with diabetic neuropathy, unspecified: Secondary | ICD-10-CM | POA: Diagnosis not present

## 2021-01-15 DIAGNOSIS — I4891 Unspecified atrial fibrillation: Secondary | ICD-10-CM

## 2021-01-15 DIAGNOSIS — J449 Chronic obstructive pulmonary disease, unspecified: Secondary | ICD-10-CM | POA: Diagnosis not present

## 2021-01-15 DIAGNOSIS — I495 Sick sinus syndrome: Secondary | ICD-10-CM

## 2021-01-15 DIAGNOSIS — R2689 Other abnormalities of gait and mobility: Secondary | ICD-10-CM

## 2021-01-15 DIAGNOSIS — Z720 Tobacco use: Secondary | ICD-10-CM

## 2021-01-15 DIAGNOSIS — W19XXXD Unspecified fall, subsequent encounter: Secondary | ICD-10-CM

## 2021-01-15 DIAGNOSIS — Z7901 Long term (current) use of anticoagulants: Secondary | ICD-10-CM

## 2021-01-15 DIAGNOSIS — IMO0002 Reserved for concepts with insufficient information to code with codable children: Secondary | ICD-10-CM

## 2021-01-15 LAB — BASIC METABOLIC PANEL
BUN: 12 mg/dL (ref 6–23)
CO2: 32 mEq/L (ref 19–32)
Calcium: 9 mg/dL (ref 8.4–10.5)
Chloride: 102 mEq/L (ref 96–112)
Creatinine, Ser: 1.21 mg/dL (ref 0.40–1.50)
GFR: 58.08 mL/min — ABNORMAL LOW (ref >60.00)
Glucose, Bld: 188 mg/dL — ABNORMAL HIGH (ref 70–99)
Potassium: 4.4 mEq/L (ref 3.5–5.1)
Sodium: 137 mEq/L (ref 135–145)

## 2021-01-15 LAB — CBC WITH DIFFERENTIAL/PLATELET
Basophils Absolute: 0 10*3/uL (ref 0.0–0.1)
Basophils Relative: 0.2 % (ref 0.0–3.0)
Eosinophils Absolute: 0.2 10*3/uL (ref 0.0–0.7)
Eosinophils Relative: 1.8 % (ref 0.0–5.0)
HCT: 39.1 % (ref 39.0–52.0)
Hemoglobin: 12.9 g/dL — ABNORMAL LOW (ref 13.0–17.0)
Lymphocytes Relative: 16.5 % (ref 12.0–46.0)
Lymphs Abs: 1.4 10*3/uL (ref 0.7–4.0)
MCHC: 33.1 g/dL (ref 30.0–36.0)
MCV: 90.9 fl (ref 78.0–100.0)
Monocytes Absolute: 0.5 10*3/uL (ref 0.1–1.0)
Monocytes Relative: 6.1 % (ref 3.0–12.0)
Neutro Abs: 6.6 10*3/uL (ref 1.4–7.7)
Neutrophils Relative %: 75.4 % (ref 43.0–77.0)
Platelets: 232 10*3/uL (ref 150.0–400.0)
RBC: 4.3 Mil/uL (ref 4.22–5.81)
RDW: 15.9 % — ABNORMAL HIGH (ref 11.5–15.5)
WBC: 8.8 10*3/uL (ref 4.0–10.5)

## 2021-01-15 LAB — PROTIME-INR
INR: 3.6 ratio — ABNORMAL HIGH (ref 0.8–1.0)
Prothrombin Time: 40.3 s — ABNORMAL HIGH (ref 9.6–13.1)

## 2021-01-15 LAB — HEMOGLOBIN A1C: Hgb A1c MFr Bld: 7.6 % — ABNORMAL HIGH (ref 4.6–6.5)

## 2021-01-15 LAB — TSH: TSH: 4.47 u[IU]/mL (ref 0.35–4.50)

## 2021-01-15 NOTE — Telephone Encounter (Signed)
Received call from Access Nurse reporting they have pt on phone with syncope episodes that triggered an ER disposition. Pt is refusing ER. Talked to pt who reports since his last OV with Dr. Silvio Pate he has had several more syncope episodes, 3 times in [place], [place]. Pt has a long hx of syncope events and was last seen in office by Dr. Silvio Pate on 1/27 for this. Pt did not agree this provider and reported he just tried to take his meds away and did nothing to help him. Pt reports falling again in [place] and hurting himself again in the same area as last time. Pt reports his HR is normally in the 40s to 50s and his BP will be 160-80. He reports if his HR goes up to 80s or 90s he was told he could have a stroke or HA and his BP drops which causes the syncope events. Pt is afraid to go anywhere due to these events. Pt has apt today with cardiology coumadin clinic and cardiologist on 2/11 but is requesting to be seen by PCP on 2/11 due to that is the only time his wife can bring him. Advised no apts on 2/11 but he has apt today for VV. He said he wants an OV but is not sure if anyone can bring him. He contacted a friend and they said they would bring him today for OV. Advised apt for today will be changed to OV. Advised of ER precautions. Pt verbalized understanding.

## 2021-01-15 NOTE — Patient Instructions (Addendum)
Labs today  Ambulatory pulse ox today to see if you qualify for home oxygen.  Decrease flomax to 1 pill nightly - we may transition to finasteride instead of flomax.  Keep appointment with Dr Rockey Situ - to discuss pacemaker vs medicine to raise blood pressures.  Hold metoprolol for now. Decrease gabapentin to 300mg  once daily.

## 2021-01-15 NOTE — Telephone Encounter (Signed)
[facility] Day - Client TELEPHONE ADVICE RECORD [facility]urse Patient Name: [name] Gender: Male DOB: 27-Jun-1946 Age: 75 Y 1 M 13 D Return Phone Number: [phone] (Primary) Address: City/State/Zip: [place] [address] Client [facility] Day - Client Client Site [place] Physician Ria Bush - MD Contact Type Call Who Is Calling Patient / Member / Family / Caregiver Call Type Triage / Clinical Relationship To Patient Self Return Phone Number [phone] (Primary) Chief Complaint FAINTING or [place] Reason for Call Symptomatic / Request for [place] states when he stands up he passes out . When he sits he is fine. He has an appointment Friday with his doctor. He has bruises, his head is sore, and his shoulder. Current BP is102/70 and HR 87 at rest. Translation No Nurse Assessment Nurse: Louretta Shorten, RN, [place] Date/Time (Eastern Time): 01/15/2021 10:11:21 AM Confirm and document reason for call. If symptomatic, describe symptoms. ---Caller states when he stands up he passes out . When he sits he is fine. He has an appointment Friday with his doctor. He has bruises, his head is sore, and his shoulder. Current BP is102/70 and HR 87 at rest. Does the patient have any new or worsening symptoms? ---Yes Will a triage be completed? ---Yes Related visit to physician within the last 2 weeks? ---No Does the PT have any chronic conditions? (i.e. diabetes, asthma, this includes High risk factors for pregnancy, etc.) ---Unknown Is this a behavioral health or substance abuse call? ---No Guidelines Guideline Title Affirmed Question Affirmed Notes Nurse Date/Time (Eastern Time) Fainting Shock suspected (e.g., cold/pale/clammy skin, too weak to stand, low BP, rapid pulse) Louretta Shorten, RN, [place] 01/15/2021 10:11:51 AM Disp. Time Eilene Ghazi Time) Disposition Final  User 01/15/2021 10:08:48 AM Send to Urgent Queue Hilarie Fredrickson 01/15/2021 10:17:34 AM 911 Outcome Documentation Louretta Shorten, RN, [place] Reason: caller refused to call 911. PLEASE NOTE: All timestamps contained within this report are represented as [place] Standard Time. CONFIDENTIALTY NOTICE: This fax transmission is intended only for the addressee. It contains information that is legally privileged, confidential or otherwise protected from use or disclosure. If you are not the intended recipient, you are strictly prohibited from reviewing, disclosing, copying using or disseminating any of this information or taking any action in reliance on or regarding this information. If you have received this fax in error, please notify us immediately by telephone so that we can arrange for its return to [place]. Phone: [phone], Toll-Free: [phone], Fax: [phone] Page: 2 of 2 Call Id: [id number] 01/15/2021 10:16:17 AM Call EMS 911 Now Yes Louretta Shorten, RN, [place] Caller Disagree/Comply Disagree Caller Understands Yes PreDisposition InappropriateToAsk Care Advice Given Per Guideline CALL EMS 911 NOW: * Immediate medical attention is needed. You need to hang up and call 911 (or an ambulance). * Triager Discretion: I'll call you back in a few minutes to be sure you were able to reach them. Comments User: [place], [place], RN Date/Time Eilene Ghazi Time): 01/15/2021 10:15:24 AM Caller refused 911 outcome- states he wants to see his PCP on Friday. Referrals GO TO FACILITY REFUSED

## 2021-01-15 NOTE — Telephone Encounter (Signed)
Seen today. 

## 2021-01-15 NOTE — Progress Notes (Addendum)
Patient ID: [name], male    DOB: [birth year], 75 y.o.   MRN: [id number]  This visit was conducted in person.  BP 106/66 (BP Location: Right Arm, Patient Position: Sitting, Cuff Size: Large)   Pulse 70   Temp 97.7 F (36.5 C) (Temporal)   Ht 6' (1.829 m)   Wt 238 lb 8 oz (108.2 kg)   SpO2 92% Comment: RA  BMI 32.35 kg/m   No data found.  Lying 112/68 Standing 108/64 O2 sat intially low, increases to 92% (RA) at rest.  Unable to do ambulatory pulse ox due to unsteadiness on his feet   CC: recurrent syncope Subjective:   HPI: [name] is a 75 y.o. male presenting on 01/15/2021 for Loss of Consciousness (C/o fainting spells for past 2-3 yrs, worsening.  Last episode this morning.  Brought in home BP monitor to compare.  Reading in office to day, 72/63.)   Known COPD continued smoker, DM, parox afib, sick sinus syndrome with chronotropic incompetence who has declined pacemaker placement. Has coumadin clinic scheduled tomorrow - asks to check INR if labs done today.   Saw Dr Silvio Pate 2 wks ago after recurrent falls with syncope - multiple syncopal events over the past few months. Has had falls with injury to ribs.  Orthostatic blood pressures markedly abnormal - with Dr Silvio Pate, less severe in office today.   Latest syncopal event this morning - BP when he woke up 130/70, HR 53. When he got up to walk from kitchen to bedroom passed out and landed on bed. Feels very dizzy when he gets up. Notes BP elevation in evenings.  He has upcoming cardiology appointment scheduled Friday.  Has previously declined pacemaker discussion.   Does not regularly take lasix.  Takes amiodarone 200mg  once daily.  BPH - on flomax 0.4mg  2 tablets at night.  Takes metformin XR twice daily.  Takes gabapentin 600mg  in the mornings.   Denies tremor, memory trouble or muscle weakness.  He walks with quad cane but not walker.   Bilat inguinal hernias - saw local gen surgeon - referred to [facility] for  further evaluation.   Hypoxic when arriving into office today - off oxygen. He states friend brought him in today.  OSA on CPAP with O2 at night.  Smoking - down to 1 ppd (from 2 ppd).   Requests prescription for nurse aide visit given recurrent syncope.      Relevant past medical, surgical, family and social history reviewed and updated as indicated. Interim medical history since our last visit reviewed. Allergies and medications reviewed and updated. Outpatient Medications Prior to Visit  Medication Sig Dispense Refill  . albuterol (VENTOLIN HFA) 108 (90 Base) MCG/ACT inhaler Inhale 2 puffs into the lungs every 6 (six) hours as needed for wheezing or shortness of breath. 18 g 11  . amiodarone (PACERONE) 200 MG tablet Take 1.5 tablets (300 mg total) by mouth daily. 135 tablet 3  . Cholecalciferol (VITAMIN D) 50 MCG (2000 UT) CAPS Take 1 capsule (2,000 Units total) by mouth daily. 30 capsule   . Coenzyme Q10 (COQ-10 PO) Take 1 tablet by mouth daily.    . Cyanocobalamin (VITAMIN B-12) 500 MCG TABS Take 500 mcg by mouth daily.    . diphenoxylate-atropine (LOMOTIL) 2.5-0.025 MG tablet Take 1 tablet by mouth 2 (two) times daily as needed. 20 tablet 1  . fish oil-omega-3 fatty acids 1000 MG capsule Take 2 g by mouth daily.    Marland Kitchen  furosemide (LASIX) 20 MG tablet TAKE 1 TABLET BY MOUTH EVERY DAY AS NEEDED FOR FLUID RETENTION 90 tablet 0  . gabapentin (NEURONTIN) 600 MG tablet Take 0.5 tablets (300 mg total) by mouth daily.    Marland Kitchen HYDROcodone-acetaminophen (NORCO) 10-325 MG tablet Take 1 tablet by mouth every 6 (six) hours as needed for moderate pain or severe pain. 120 tablet 0  . ipratropium-albuterol (DUONEB) 0.5-2.5 (3) MG/3ML SOLN Take 3 mLs by nebulization every 6 (six) hours as needed. 360 mL 11  . metFORMIN (GLUCOPHAGE-XR) 500 MG 24 hr tablet Take 500 mg by mouth 2 (two) times daily.    . methocarbamol (ROBAXIN) 750 MG tablet TAKE 1 TABLET(750 MG) BY MOUTH TWICE DAILY 180 tablet 0  .  nitroGLYCERIN (NITROLINGUAL) 0.4 MG/SPRAY spray USE 1 SPRAY AS DIRECTED EVERY 5 MINUTES AS NEEDED 4.9 g 1  . omeprazole (PRILOSEC) 40 MG capsule Take 1 capsule (40 mg total) by mouth daily. 90 capsule 3  . pravastatin (PRAVACHOL) 20 MG tablet TAKE 1 TABLET(20 MG) BY MOUTH DAILY 90 tablet 3  . warfarin (COUMADIN) 5 MG tablet TAKE AS DIRECTED FOR ANTI-COAG CLINIC 180 tablet 0  . tamsulosin (FLOMAX) 0.4 MG CAPS capsule TAKE 1 CAPSULE(0.4 MG) BY MOUTH TWICE DAILY 60 capsule 11  . tamsulosin (FLOMAX) 0.4 MG CAPS capsule Take 1 capsule (0.4 mg total) by mouth daily after supper. 30 capsule 6   No facility-administered medications prior to visit.     Per HPI unless specifically indicated in ROS section below Review of Systems Objective:  BP 106/66 (BP Location: Right Arm, Patient Position: Sitting, Cuff Size: Large)   Pulse 70   Temp 97.7 F (36.5 C) (Temporal)   Ht 6' (1.829 m)   Wt 238 lb 8 oz (108.2 kg)   SpO2 92% Comment: RA  BMI 32.35 kg/m   Wt Readings from Last 3 Encounters:  01/15/21 238 lb 8 oz (108.2 kg)  01/03/21 238 lb (108 kg)  12/17/20 242 lb 12.8 oz (110.1 kg)      Physical Exam Vitals and nursing note reviewed.  Constitutional:      Appearance: Normal appearance. He is not ill-appearing.     Comments: In wheelchair, brings quad cane as well  Cardiovascular:     Rate and Rhythm: Normal rate. Rhythm irregular.     Pulses: Normal pulses.  Pulmonary:     Effort: Pulmonary effort is normal. No respiratory distress.     Breath sounds: Wheezing (faint) present. No rhonchi or rales.     Comments: Coarse breath sounds Musculoskeletal:     Right lower leg: No edema.     Left lower leg: No edema.     Comments: FROM at hips and knees  Skin:    General: Skin is warm and dry.  Neurological:     Mental Status: He is alert.     Comments:  Strength grossly intact including BLE 5/5 Significantly unsteady on his feet  Psychiatric:        Mood and Affect: Mood normal.         Behavior: Behavior normal.       Results for orders placed or performed in visit on 01/15/21  CBC with Differential/Platelet  Result Value Ref Range   WBC 8.8 4.0 - 10.5 K/uL   RBC 4.30 4.22 - 5.81 Mil/uL   Hemoglobin 12.9 (L) 13.0 - 17.0 g/dL   HCT 39.1 39.0 - 52.0 %   MCV 90.9 78.0 - 100.0 fl   MCHC 33.1  30.0 - 36.0 g/dL   RDW 15.9 (H) 11.5 - 15.5 %   Platelets 232.0 150.0 - 400.0 K/uL   Neutrophils Relative % 75.4 43.0 - 77.0 %   Lymphocytes Relative 16.5 12.0 - 46.0 %   Monocytes Relative 6.1 3.0 - 12.0 %   Eosinophils Relative 1.8 0.0 - 5.0 %   Basophils Relative 0.2 0.0 - 3.0 %   Neutro Abs 6.6 1.4 - 7.7 K/uL   Lymphs Abs 1.4 0.7 - 4.0 K/uL   Monocytes Absolute 0.5 0.1 - 1.0 K/uL   Eosinophils Absolute 0.2 0.0 - 0.7 K/uL   Basophils Absolute 0.0 0.0 - 0.1 K/uL  Protime-INR  Result Value Ref Range   INR 3.6 (H) 0.8 - 1.0 ratio   Prothrombin Time 40.3 (H) 9.6 - 13.1 sec  Basic metabolic panel  Result Value Ref Range   Sodium 137 135 - 145 mEq/L   Potassium 4.4 3.5 - 5.1 mEq/L   Chloride 102 96 - 112 mEq/L   CO2 32 19 - 32 mEq/L   Glucose, Bld 188 (H) 70 - 99 mg/dL   BUN 12 6 - 23 mg/dL   Creatinine, Ser 1.21 0.40 - 1.50 mg/dL   GFR 58.08 (L) >60.00 mL/min   Calcium 9.0 8.4 - 10.5 mg/dL  TSH  Result Value Ref Range   TSH 4.47 0.35 - 4.50 uIU/mL  Hemoglobin A1c  Result Value Ref Range   Hgb A1c MFr Bld 7.6 (H) 4.6 - 6.5 %   *Note: Due to a large number of results and/or encounters for the requested time period, some results have not been displayed. A complete set of results can be found in Results Review.   Assessment & Plan:  This visit occurred during the SARS-CoV-2 public health emergency.  Safety protocols were in place, including screening questions prior to the visit, additional usage of staff PPE, and extensive cleaning of exam room while observing appropriate contact time as indicated for disinfecting solutions.   Problem List Items Addressed This Visit     Type 2 diabetes, uncontrolled, with neuropathy ([place])    Update A1c. Taking metformin XR 500mg  BID.       Relevant Orders   Hemoglobin A1c (Completed)   Tobacco abuse    Continue to encourage cessation. Precontemplative.       Syncope due to orthostatic hypotension - Primary    Frequent recurrent episodes of syncope over the last few months.  Reviewed contributory meds. He doesn't regularly take antihypertensives. rec drop gabapentin to 300mg  once daily, already holding metoprolol.  Anticipate SSS contributing - he has previously declined pacemaker but today states he would be willing to further discuss this with cardiology - and has f/u appt on Friday.  Anticipate autonomic neuropathy also contributing - also discussed possible benefit of medication to raise BP - to also discuss this with cardiology. He doesn't have signs/symptoms of other neurodegenerative process going on like parkinson's or multiple system atrophy.  Advised avoid driving, avoid ambulation as much as able until cardiology eval later this week.       Relevant Orders   CBC with Differential/Platelet (Completed)   Basic metabolic panel (Completed)   TSH (Completed)   Sick sinus syndrome ([place])    Anticipate contributing to recurrent syncope.  Today he is more open to discussion of pacemaker - states he is fearful of being put under due to bad experience last under anesthesia. I encouraged he discuss options with Dr Rockey Situ on Friday.  Prostate cancer ([place])    S/p XRT ([place]).  He is on flomax 0.8mg  nightly - advised drop to 0.4mg  and discussed possible transition off flomax and onto finasteride.       Imbalance    Ongoing marked unsteadiness with standing.  He may benefit from part time in-home aide - will check with insurance and bring me any form that may need to be filled out.       HTN (hypertension)    This has largely resolved - now concern for hypotension. Only takes antihypertensives PRN - none  recently, no recent lasix use.       Fall with injury    Multiple recent falls with injury - see below.       Encounter for chronic pain management   Current use of long term anticoagulation (Coumadin)    Check INR, forward to coumadin clinic.       COPD mixed type ([place])    Continued smoker. O2 sats low when arriving but do rise with rest.  Unable to complete ambulatory pulse ox today given unsteadiness on his feet. Will work towards controlling orthostatic syncope then reassess COPD.       Atrial fibrillation ([facility])    Continues coumadin daily - check INR as we're drawing labs and will forward to coumadin clinic.       Relevant Orders   Protime-INR (Completed)   TSH (Completed)       No orders of the defined types were placed in this encounter.  Orders Placed This Encounter  Procedures  . CBC with Differential/Platelet  . Protime-INR  . Basic metabolic panel  . TSH  . Hemoglobin A1c    Patient Instructions  Labs today  Ambulatory pulse ox today to see if you qualify for home oxygen.  Decrease flomax to 1 pill nightly - we may transition to finasteride instead of flomax.  Keep appointment with Dr Rockey Situ - to discuss pacemaker vs medicine to raise blood pressures.  Hold metoprolol for now. Decrease gabapentin to 300mg  once daily.   Follow up plan: Return in about 3 months (around 04/14/2021), or if symptoms worsen or fail to improve.  Ria Bush, MD

## 2021-01-17 ENCOUNTER — Ambulatory Visit (INDEPENDENT_AMBULATORY_CARE_PROVIDER_SITE_OTHER): Payer: Medicare Other

## 2021-01-17 ENCOUNTER — Telehealth: Payer: Self-pay | Admitting: Family Medicine

## 2021-01-17 DIAGNOSIS — I4891 Unspecified atrial fibrillation: Secondary | ICD-10-CM

## 2021-01-17 NOTE — Telephone Encounter (Signed)
plz notify INR returned elevated at 3.6. Recommend he hold coumadin next dose and await further recommendations from coumadin clinic.

## 2021-01-17 NOTE — Assessment & Plan Note (Signed)
Check INR, forward to coumadin clinic.

## 2021-01-17 NOTE — Assessment & Plan Note (Signed)
S/p XRT ([place]).  He is on flomax 0.8mg  nightly - advised drop to 0.4mg  and discussed possible transition off flomax and onto finasteride.

## 2021-01-17 NOTE — Assessment & Plan Note (Signed)
Update A1c. Taking metformin XR 500mg  BID.

## 2021-01-17 NOTE — Assessment & Plan Note (Signed)
Ongoing marked unsteadiness with standing.  He may benefit from part time in-home aide - will check with insurance and bring me any form that may need to be filled out.

## 2021-01-17 NOTE — Assessment & Plan Note (Signed)
Continue to encourage cessation. Precontemplative.  ?

## 2021-01-17 NOTE — Assessment & Plan Note (Addendum)
This has largely resolved - now concern for hypotension. Only takes antihypertensives PRN - none recently, no recent lasix use.

## 2021-01-17 NOTE — Assessment & Plan Note (Signed)
Continued smoker. O2 sats low when arriving but do rise with rest.  Unable to complete ambulatory pulse ox today given unsteadiness on his feet. Will work towards controlling orthostatic syncope then reassess COPD.
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
## 2021-01-17 NOTE — Assessment & Plan Note (Signed)
Continues coumadin daily - check INR as we're drawing labs and will forward to coumadin clinic.

## 2021-01-17 NOTE — Telephone Encounter (Signed)
Spoke with pt's wife, [name] (on dpr), relaying Dr. Synthia Innocent message.  She verbalizes understanding and will inform pt.

## 2021-01-17 NOTE — Assessment & Plan Note (Addendum)
Anticipate contributing to recurrent syncope.  Today he is more open to discussion of pacemaker - states he is fearful of being put under due to bad experience last under anesthesia. I encouraged he discuss options with Dr Rockey Situ on Friday.

## 2021-01-17 NOTE — Assessment & Plan Note (Signed)
Multiple recent falls with injury - see below.

## 2021-01-17 NOTE — Telephone Encounter (Signed)
Result addressed, see anticoagulation note in Epic.  

## 2021-01-17 NOTE — Patient Instructions (Signed)
Description   -Called spoke with pt's wife, advised to have pt skip 1 dosage of Warfarin, then resume same dosage of warfarin 1/2 tablet every day EXCEPT 1 tablet on [place]. - recheck INR in 3 weeks

## 2021-01-17 NOTE — Assessment & Plan Note (Addendum)
Frequent recurrent episodes of syncope over the last few months.  Reviewed contributory meds. He doesn't regularly take antihypertensives. rec drop gabapentin to 300mg  once daily, already holding metoprolol.  Anticipate SSS contributing - he has previously declined pacemaker but today states he would be willing to further discuss this with cardiology - and has f/u appt on Friday.  Anticipate autonomic neuropathy also contributing - also discussed possible benefit of medication to raise BP - to also discuss this with cardiology. He doesn't have signs/symptoms of other neurodegenerative process going on like parkinson's or multiple system atrophy.  Advised avoid driving, avoid ambulation as much as able until cardiology eval later this week.

## 2021-01-18 ENCOUNTER — Other Ambulatory Visit: Payer: Self-pay

## 2021-01-18 ENCOUNTER — Ambulatory Visit: Payer: Medicare Other

## 2021-01-18 ENCOUNTER — Ambulatory Visit (INDEPENDENT_AMBULATORY_CARE_PROVIDER_SITE_OTHER): Payer: Medicare Other | Admitting: Cardiovascular Disease

## 2021-01-18 ENCOUNTER — Encounter: Payer: Self-pay | Admitting: Cardiovascular Disease

## 2021-01-18 VITALS — BP 133/62 | HR 40 | Ht 72.0 in | Wt 239.0 lb

## 2021-01-18 DIAGNOSIS — I2721 Secondary pulmonary arterial hypertension: Secondary | ICD-10-CM

## 2021-01-18 DIAGNOSIS — I10 Essential (primary) hypertension: Secondary | ICD-10-CM

## 2021-01-18 DIAGNOSIS — J432 Centrilobular emphysema: Secondary | ICD-10-CM

## 2021-01-18 DIAGNOSIS — R55 Syncope and collapse: Secondary | ICD-10-CM

## 2021-01-18 DIAGNOSIS — I4891 Unspecified atrial fibrillation: Secondary | ICD-10-CM

## 2021-01-18 DIAGNOSIS — E782 Mixed hyperlipidemia: Secondary | ICD-10-CM

## 2021-01-18 DIAGNOSIS — I77810 Thoracic aortic ectasia: Secondary | ICD-10-CM

## 2021-01-18 DIAGNOSIS — R0600 Dyspnea, unspecified: Secondary | ICD-10-CM

## 2021-01-18 MED ORDER — MIDODRINE HCL 10 MG PO TABS: 10.0000 mg | ORAL_TABLET | Freq: Three times a day (TID) | ORAL | 1 refills | Status: DC | PRN

## 2021-01-18 NOTE — Patient Instructions (Addendum)
Medication Instructions:  To stop blood pressure drops, Midodrine 5-10 mg Am and after lunch as needed for low blood pressure  If you need a refill on your cardiac medications before your next appointment, please call your pharmacy.    Lab work: No new labs needed   If you have labs (blood work) drawn today and your tests are completely normal, you will receive your results only by: Marland Kitchen MyChart Message (if you have MyChart) OR . A paper copy in the mail If you have any lab test that is abnormal or we need to change your treatment, we will call you to review the results.   Testing/Procedures: Zio monitor for atrial fibrillation, syncope  Your physician has recommended that you wear a 14 day Zio XT (heart) monitor (placed in office today). This monitor is a medical device that records the heart's electrical activity. Doctors most often use these monitors to diagnose arrhythmias. Arrhythmias are problems with the speed or rhythm of the heartbeat. The monitor is a small device applied to your chest. You can wear one while you do your normal daily activities. While wearing this monitor if you have any symptoms to push the button and record what you felt. Once you have worn this monitor for the period of time provider prescribed (Usually 14 days), you will return the monitor device in the postage paid box. Once it is returned they will download the data collected and provide [place] with a report which the provider will then review and we will call you with those results. Important tips:  1. Avoid showering during the first 24 hours of wearing the monitor. 2. Avoid excessive sweating to help maximize wear time. 3. Do not submerge the device, no hot tubs, and no swimming pools. 4. Keep any lotions or oils away from the patch. 5. After 24 hours you may shower with the patch on. Take brief showers with your back facing the shower head.  6. Do not remove patch once it has been placed because that will  interrupt data and decrease adhesive wear time. 7. Push the button when you have any symptoms and write down what you were feeling. 8. Once you have completed wearing your monitor, remove and place into box which has postage paid and place in your outgoing mailbox.  9. If for some reason you have misplaced your box then call our office and we can provide another box and/or mail it off for you.         Follow-Up: At [facility], you and your health needs are our priority.  As part of our continuing mission to provide you with exceptional heart care, we have created designated Provider Care Teams.  These Care Teams include your primary Cardiologist (physician) and Advanced Practice Providers (APPs -  Physician Assistants and Nurse Practitioners) who all work together to provide you with the care you need, when you need it.  . You will need a follow up appointment in 5-6 weeks APP ok  . Providers on your designated Care Team:   . Murray Hodgkins, NP . Christell Faith, PA-C . Marrianne Mood, PA-C  Any Other Special Instructions Will Be Listed Below (If Applicable).  COVID-19 Vaccine Information can be found at: ShippingScam.co.uk For questions related to vaccine distribution or appointments, please email vaccine@[email or web address] .[email or web address] or call [phone].

## 2021-01-21 ENCOUNTER — Encounter: Payer: Self-pay | Admitting: Family Medicine

## 2021-01-21 ENCOUNTER — Ambulatory Visit (INDEPENDENT_AMBULATORY_CARE_PROVIDER_SITE_OTHER): Payer: Medicare Other | Admitting: Family Medicine

## 2021-01-21 ENCOUNTER — Telehealth: Payer: Self-pay

## 2021-01-21 ENCOUNTER — Other Ambulatory Visit: Payer: Self-pay

## 2021-01-21 ENCOUNTER — Ambulatory Visit (INDEPENDENT_AMBULATORY_CARE_PROVIDER_SITE_OTHER)
Admission: RE | Admit: 2021-01-21 | Discharge: 2021-01-21 | Disposition: A | Discharge status: Home / Self Care | Payer: Medicare Other | Source: Ambulatory Visit | Attending: Family Medicine | Admitting: Family Medicine

## 2021-01-21 VITALS — BP 116/70 | HR 71 | Temp 98.4°F | Ht 72.0 in | Wt 235.2 lb

## 2021-01-21 DIAGNOSIS — I951 Orthostatic hypotension: Secondary | ICD-10-CM | POA: Diagnosis not present

## 2021-01-21 DIAGNOSIS — J449 Chronic obstructive pulmonary disease, unspecified: Secondary | ICD-10-CM | POA: Diagnosis not present

## 2021-01-21 DIAGNOSIS — I4891 Unspecified atrial fibrillation: Secondary | ICD-10-CM

## 2021-01-21 DIAGNOSIS — I25118 Atherosclerotic heart disease of native coronary artery with other forms of angina pectoris: Secondary | ICD-10-CM

## 2021-01-21 DIAGNOSIS — I2721 Secondary pulmonary arterial hypertension: Secondary | ICD-10-CM

## 2021-01-21 DIAGNOSIS — M79604 Pain in right leg: Secondary | ICD-10-CM

## 2021-01-21 DIAGNOSIS — F1120 Opioid dependence, uncomplicated: Secondary | ICD-10-CM

## 2021-01-21 DIAGNOSIS — M79651 Pain in right thigh: Secondary | ICD-10-CM | POA: Diagnosis not present

## 2021-01-21 DIAGNOSIS — G4733 Obstructive sleep apnea (adult) (pediatric): Secondary | ICD-10-CM

## 2021-01-21 DIAGNOSIS — I495 Sick sinus syndrome: Secondary | ICD-10-CM

## 2021-01-21 DIAGNOSIS — R2689 Other abnormalities of gait and mobility: Secondary | ICD-10-CM

## 2021-01-21 DIAGNOSIS — J9621 Acute and chronic respiratory failure with hypoxia: Secondary | ICD-10-CM

## 2021-01-21 DIAGNOSIS — W19XXXD Unspecified fall, subsequent encounter: Secondary | ICD-10-CM

## 2021-01-21 NOTE — Telephone Encounter (Signed)
Pt's wife, [name], returning call.  States pt is running a few mins late.  I relayed Dr. Synthia Innocent message.  She states pt needs to show Dr. Darnell Level something on his hip concerning fall.    OV kept on schedule.

## 2021-01-21 NOTE — Progress Notes (Signed)
Patient ID: [name], male    DOB: 06-03-1946, 75 y.o.   MRN: [id number]  This visit was conducted in person.  BP 116/70   Pulse 71   Temp 98.4 F (36.9 C) (Temporal)   Ht 6' (1.829 m)   Wt 235 lb 4 oz (106.7 kg)   SpO2 99%   BMI 31.91 kg/m   No data found. Last visit O2 sat on arrival was down to 84% on RA, did improve with rest.   CC: R hip pain due to fall  Subjective:   HPI: [name] is a 75 y.o. male presenting on 01/21/2021 for Hip Pain (C/o right hip pain due to fall. )   See prior note for details.  Recently saw Dr Rockey Situ, note reviewed. Ordered Zio monitor to eval for arrhythmia as cause of syncope. Started on midodrin 5-10mg  TID PRN to prevent further orthostatic syncope - tolerating this well and has felt more steady on his feet. Has only taken one or two doses.   Continues holding metoprolol, only uses metoprolol or dilt PRN tachycardia.  Last visit we dropped gabapentin to 300mg  once daily.   Notes worsening R lateral hip pain since a fall 12/2020. Painful when turning over on R side in bed.      Relevant past medical, surgical, family and social history reviewed and updated as indicated. Interim medical history since our last visit reviewed. Allergies and medications reviewed and updated. Outpatient Medications Prior to Visit  Medication Sig Dispense Refill  . albuterol (VENTOLIN HFA) 108 (90 Base) MCG/ACT inhaler Inhale 2 puffs into the lungs every 6 (six) hours as needed for wheezing or shortness of breath. 18 g 11  . amiodarone (PACERONE) 200 MG tablet Take 1.5 tablets (300 mg total) by mouth daily. 135 tablet 3  . Cholecalciferol (VITAMIN D) 50 MCG (2000 UT) CAPS Take 1 capsule (2,000 Units total) by mouth daily. 30 capsule   . Coenzyme Q10 (COQ-10 PO) Take 1 tablet by mouth daily.    . Cyanocobalamin (VITAMIN B-12) 500 MCG TABS Take 500 mcg by mouth daily.    . diphenoxylate-atropine (LOMOTIL) 2.5-0.025 MG tablet Take 1 tablet by mouth 2 (two)  times daily as needed. 20 tablet 1  . fish oil-omega-3 fatty acids 1000 MG capsule Take 2 g by mouth daily.    . furosemide (LASIX) 20 MG tablet TAKE 1 TABLET BY MOUTH EVERY DAY AS NEEDED FOR FLUID RETENTION 90 tablet 0  . gabapentin (NEURONTIN) 600 MG tablet Take 0.5 tablets (300 mg total) by mouth daily.    Marland Kitchen HYDROcodone-acetaminophen (NORCO) 10-325 MG tablet Take 1 tablet by mouth every 6 (six) hours as needed for moderate pain or severe pain. 120 tablet 0  . ipratropium-albuterol (DUONEB) 0.5-2.5 (3) MG/3ML SOLN Take 3 mLs by nebulization every 6 (six) hours as needed. 360 mL 11  . metFORMIN (GLUCOPHAGE-XR) 500 MG 24 hr tablet Take 500 mg by mouth 2 (two) times daily.    . methocarbamol (ROBAXIN) 750 MG tablet TAKE 1 TABLET(750 MG) BY MOUTH TWICE DAILY 180 tablet 0  . midodrine (PROAMATINE) 10 MG tablet Take 1 tablet (10 mg total) by mouth 3 (three) times daily as needed. 90 tablet 1  . nitroGLYCERIN (NITROLINGUAL) 0.4 MG/SPRAY spray USE 1 SPRAY AS DIRECTED EVERY 5 MINUTES AS NEEDED 4.9 g 1  . omeprazole (PRILOSEC) 40 MG capsule Take 1 capsule (40 mg total) by mouth daily. 90 capsule 3  . pravastatin (PRAVACHOL) 20 MG tablet  TAKE 1 TABLET(20 MG) BY MOUTH DAILY 90 tablet 3  . tamsulosin (FLOMAX) 0.4 MG CAPS capsule Take 1 capsule (0.4 mg total) by mouth daily after supper. 30 capsule 6  . warfarin (COUMADIN) 5 MG tablet TAKE AS DIRECTED FOR ANTI-COAG CLINIC 180 tablet 0   No facility-administered medications prior to visit.     Per HPI unless specifically indicated in ROS section below Review of Systems Objective:  BP 116/70   Pulse 71   Temp 98.4 F (36.9 C) (Temporal)   Ht 6' (1.829 m)   Wt 235 lb 4 oz (106.7 kg)   SpO2 99%   BMI 31.91 kg/m   Wt Readings from Last 3 Encounters:  01/21/21 235 lb 4 oz (106.7 kg)  01/18/21 239 lb (108.4 kg)  01/15/21 238 lb 8 oz (108.2 kg)      Physical Exam Vitals and nursing note reviewed.  Constitutional:      Appearance: Normal  appearance. He is obese. He is not ill-appearing.     Comments: In wheelchair  Cardiovascular:     Rate and Rhythm: Tachycardia present. Rhythm irregular.     Pulses: Normal pulses.     Heart sounds: Normal heart sounds. No murmur heard.   Pulmonary:     Effort: Pulmonary effort is normal. No respiratory distress.     Breath sounds: Normal breath sounds. No wheezing, rhonchi or rales.  Musculoskeletal:        General: No swelling or tenderness. Normal range of motion.     Right lower leg: No edema.     Left lower leg: No edema.     Comments:  Neg seated SLR bilaterally. No pain with int/ext rotation at hip. No pain at GTB bilaterally  Point discomfort to palpation R lateral proximal femur distal to trochanteric bursa  Skin:    General: Skin is warm and dry.     Findings: No bruising.  Neurological:     Mental Status: He is alert.       Results for orders placed or performed in visit on 01/15/21  CBC with Differential/Platelet  Result Value Ref Range   WBC 8.8 4.0 - 10.5 K/uL   RBC 4.30 4.22 - 5.81 Mil/uL   Hemoglobin 12.9 (L) 13.0 - 17.0 g/dL   HCT 39.1 39.0 - 52.0 %   MCV 90.9 78.0 - 100.0 fl   MCHC 33.1 30.0 - 36.0 g/dL   RDW 15.9 (H) 11.5 - 15.5 %   Platelets 232.0 150.0 - 400.0 K/uL   Neutrophils Relative % 75.4 43.0 - 77.0 %   Lymphocytes Relative 16.5 12.0 - 46.0 %   Monocytes Relative 6.1 3.0 - 12.0 %   Eosinophils Relative 1.8 0.0 - 5.0 %   Basophils Relative 0.2 0.0 - 3.0 %   Neutro Abs 6.6 1.4 - 7.7 K/uL   Lymphs Abs 1.4 0.7 - 4.0 K/uL   Monocytes Absolute 0.5 0.1 - 1.0 K/uL   Eosinophils Absolute 0.2 0.0 - 0.7 K/uL   Basophils Absolute 0.0 0.0 - 0.1 K/uL  Protime-INR  Result Value Ref Range   INR 3.6 (H) 0.8 - 1.0 ratio   Prothrombin Time 40.3 (H) 9.6 - 13.1 sec  Basic metabolic panel  Result Value Ref Range   Sodium 137 135 - 145 mEq/L   Potassium 4.4 3.5 - 5.1 mEq/L   Chloride 102 96 - 112 mEq/L   CO2 32 19 - 32 mEq/L   Glucose, Bld 188 (H) 70 -  99 mg/dL  BUN 12 6 - 23 mg/dL   Creatinine, Ser 1.21 0.40 - 1.50 mg/dL   GFR 58.08 (L) >60.00 mL/min   Calcium 9.0 8.4 - 10.5 mg/dL  TSH  Result Value Ref Range   TSH 4.47 0.35 - 4.50 uIU/mL  Hemoglobin A1c  Result Value Ref Range   Hgb A1c MFr Bld 7.6 (H) 4.6 - 6.5 %   *Note: Due to a large number of results and/or encounters for the requested time period, some results have not been displayed. A complete set of results can be found in Results Review.   DG FEMUR, MIN 2 VIEWS RIGHT CLINICAL DATA:  Right proximal femur pain.  EXAM: RIGHT FEMUR 2 VIEWS  COMPARISON:  Pelvic radiograph 01/03/2021 and right hip radiographs from 11/25/2019  FINDINGS: Chronic spurring along the right femoral head. Common iliac stents noted.  Tricompartmental loss of articular space in the knee with associated spurring and probable meniscal chondrocalcinosis, potentially reflecting CPPD arthropathy.  IMPRESSION: 1. No acute femoral findings. 2. 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  Electronically Signed   By: Van Clines M.D.   On: 01/21/2021 15:57   Assessment & Plan:  This visit occurred during the SARS-CoV-2 public health emergency.  Safety protocols were in place, including screening questions prior to the visit, additional usage of staff PPE, and extensive cleaning of exam room while observing appropriate contact time as indicated for disinfecting solutions.   Problem List Items Addressed This Visit    Syncope due to orthostatic hypotension - Primary    Ongoing. Saw cardiology planned event monitor for further evaluation of syncopal spells.  Hopeful midodrine will significant help stabilize upright blood pressures to prevent further orthostatic syncope.        Sick sinus syndrome (HCC)   Right leg pain    Ongoing R lateral thigh pain after fall suffered last week.  Pain is distal to trochanteric bursitis.  Anticipate bony bruise/contusion. Check films r/o femur injury.       Relevant Orders   DG FEMUR, MIN 2 VIEWS RIGHT (Completed)   PAH (pulmonary artery hypertension) (HCC)    Continue CPAP with O2      OSA (obstructive sleep apnea)    Continue nightly CPAP with O2      Opiate dependence (HCC) (Chronic)   Imbalance   Relevant Orders   For home use only DME oxygen   Fall with injury   COPD mixed type ()   Relevant Orders   For home use only DME oxygen   Atrial fibrillation ([place])   Atherosclerotic heart disease of native coronary artery with other forms of angina pectoris ([place])   Acute on chronic respiratory failure with hypoxia ([place])    Passes ambulatory pulse ox today however last visit he arrived hypoxic to 84% on RA. Will refer for home O2 therapy. Needs portable oxygen concentrator for mobility.       Relevant Orders   For home use only DME oxygen       No orders of the defined types were placed in this encounter.  Orders Placed This Encounter  Procedures  . For home use only DME oxygen    Order Specific Question:   Length of Need    Answer:   Lifetime    Order Specific Question:   Mode or (Route)    Answer:   Nasal cannula    Order Specific Question:   Liters per Minute    Answer:  2    Order Specific Question:   Frequency    Answer:   Continuous (stationary and portable oxygen unit needed)    Order Specific Question:   Oxygen conserving device    Answer:   Yes    Order Specific Question:   Oxygen delivery system    Answer:   Gas  . DG FEMUR, MIN 2 VIEWS RIGHT    Standing Status:   Future    Number of Occurrences:   1    Standing Expiration Date:   01/21/2022    Order Specific Question:   Reason for Exam (SYMPTOM  OR DIAGNOSIS REQUIRED)    Answer:   R lateral proximal femur pain to palpation anticipate bone contusion    Order Specific Question:   Preferred imaging location?    Answer:   [facility]    Patient Instructions  Walking oxygen test  today  Right leg xray today - anticipate you had a bony contusion.  We will request home oxygen through [facility] after reviewing today's test results.  Good to see you. Return in 6 weeks for follow up visit.    Follow up plan: Return in about 6 weeks (around 03/04/2021), or if symptoms worsen or fail to improve, for follow up visit.  Ria Bush, MD

## 2021-01-21 NOTE — Telephone Encounter (Signed)
Lvm asking pt/pt's wife, [name] (on dpr) to call back.  Pt was seen last week for same sxs scheduled for today.  Per Dr. Darnell Level, today's appt can be c/x.

## 2021-01-21 NOTE — Patient Instructions (Addendum)
Walking oxygen test today  Right leg xray today - anticipate you had a bony contusion.  We will request home oxygen through [facility] after reviewing today's test results.  Good to see you. Return in 6 weeks for follow up visit.

## 2021-01-23 ENCOUNTER — Telehealth: Payer: Self-pay

## 2021-01-23 NOTE — Telephone Encounter (Signed)
Call from pt asking for update on O2 order.   I do not see an order.

## 2021-01-24 DIAGNOSIS — J9621 Acute and chronic respiratory failure with hypoxia: Secondary | ICD-10-CM | POA: Insufficient documentation

## 2021-01-24 DIAGNOSIS — J9610 Chronic respiratory failure, unspecified whether with hypoxia or hypercapnia: Secondary | ICD-10-CM | POA: Insufficient documentation

## 2021-01-24 NOTE — Telephone Encounter (Signed)
Spoke with pt's wife, [name] (on dpr), relaying Dr. Synthia Innocent message.  She verbalizes understanding and will inform pt.

## 2021-01-24 NOTE — Assessment & Plan Note (Signed)
Passes ambulatory pulse ox today however last visit he arrived hypoxic to 84% on RA. Will refer for home O2 therapy. Needs portable oxygen concentrator for mobility.

## 2021-01-24 NOTE — Assessment & Plan Note (Signed)
Continue nightly CPAP with O2

## 2021-01-24 NOTE — Assessment & Plan Note (Signed)
Ongoing R lateral thigh pain after fall suffered last week.  Pain is distal to trochanteric bursitis. Anticipate bony bruise/contusion. Check films r/o femur injury.

## 2021-01-24 NOTE — Assessment & Plan Note (Signed)
Continue CPAP with O2

## 2021-01-24 NOTE — Assessment & Plan Note (Signed)
Ongoing. Saw cardiology planned event monitor for further evaluation of syncopal spells.  Hopeful midodrine will significant help stabilize upright blood pressures to prevent further orthostatic syncope.

## 2021-01-24 NOTE — Telephone Encounter (Signed)
Order placed. He passed ambulatory pulse ox this week however given hypoxic presentation last week I still think he would qualify for home oxygen therapy.

## 2021-01-25 NOTE — Telephone Encounter (Signed)
Pt called and stated that the prescription for the oxygen was never reeivd and they are needing another script and they gave him a fax number [phone]

## 2021-01-25 NOTE — Telephone Encounter (Signed)
Patients wife would like you to call back about a portable oxygen tank for day use. EM

## 2021-01-25 NOTE — Telephone Encounter (Signed)
Please send order to [facility] and notify patient.

## 2021-01-25 NOTE — Telephone Encounter (Signed)
[name], from [facility] called my referral phone line, stating they received an order for Oxygen but when they tried to file it through with insurance found out that patient has contract/orders with [facility]. Order needs to go there. I am not sure of all the situation with this. Sending to [name] and Dr Chilton Greathouse said patient has been calling [place] several times about this so I am not sure if patient does not know what company he is using or trying to switch? Please review and speak with patient. Thank you

## 2021-01-25 NOTE — Telephone Encounter (Signed)
Printed and faxed order to fax #: [phone] provided by pt.

## 2021-01-25 NOTE — Telephone Encounter (Signed)
I sent a Community Msg to [place] letting them know Dr. Darnell Level placed DME order for home use O2.

## 2021-01-28 ENCOUNTER — Ambulatory Visit: Payer: Medicare Other | Admitting: Cardiovascular Disease

## 2021-01-31 ENCOUNTER — Telehealth: Payer: Self-pay | Admitting: Licensed Clinical Social Worker

## 2021-01-31 NOTE — Telephone Encounter (Signed)
Patient's wife called stating that he is having trouble urinating and feeling like his prostate cancer is reoccurring. I advised the wife that it would be good for him to follow up with his urologist also. Patient wanted to move April appointment already scheduled to March. Patient will be coming tomorrow for a psa and Friday 3/4 to see Dr. Baruch Gouty

## 2021-02-01 ENCOUNTER — Other Ambulatory Visit: Payer: Self-pay

## 2021-02-01 ENCOUNTER — Encounter: Payer: Self-pay | Admitting: Family Medicine

## 2021-02-01 ENCOUNTER — Telehealth: Payer: Self-pay

## 2021-02-01 ENCOUNTER — Ambulatory Visit (INDEPENDENT_AMBULATORY_CARE_PROVIDER_SITE_OTHER): Payer: Medicare Other | Admitting: Family Medicine

## 2021-02-01 ENCOUNTER — Inpatient Hospital Stay: Payer: Medicare Other | Attending: Radiation Oncology

## 2021-02-01 VITALS — BP 120/70 | HR 40 | Temp 97.7°F | Ht 72.0 in | Wt 222.5 lb

## 2021-02-01 DIAGNOSIS — G4733 Obstructive sleep apnea (adult) (pediatric): Secondary | ICD-10-CM | POA: Diagnosis not present

## 2021-02-01 DIAGNOSIS — J9621 Acute and chronic respiratory failure with hypoxia: Secondary | ICD-10-CM | POA: Diagnosis not present

## 2021-02-01 DIAGNOSIS — I25118 Atherosclerotic heart disease of native coronary artery with other forms of angina pectoris: Secondary | ICD-10-CM

## 2021-02-01 DIAGNOSIS — I951 Orthostatic hypotension: Secondary | ICD-10-CM | POA: Diagnosis not present

## 2021-02-01 DIAGNOSIS — C61 Malignant neoplasm of prostate: Secondary | ICD-10-CM | POA: Insufficient documentation

## 2021-02-01 DIAGNOSIS — R1084 Generalized abdominal pain: Secondary | ICD-10-CM | POA: Diagnosis not present

## 2021-02-01 DIAGNOSIS — R634 Abnormal weight loss: Secondary | ICD-10-CM

## 2021-02-01 LAB — CBC WITH DIFFERENTIAL/PLATELET
Basophils Absolute: 0 10*3/uL (ref 0.0–0.1)
Basophils Relative: 0.4 % (ref 0.0–3.0)
Eosinophils Absolute: 0.2 10*3/uL (ref 0.0–0.7)
Eosinophils Relative: 2 % (ref 0.0–5.0)
HCT: 42 % (ref 39.0–52.0)
Hemoglobin: 14 g/dL (ref 13.0–17.0)
Lymphocytes Relative: 17.4 % (ref 12.0–46.0)
Lymphs Abs: 1.5 10*3/uL (ref 0.7–4.0)
MCHC: 33.3 g/dL (ref 30.0–36.0)
MCV: 90.8 fl (ref 78.0–100.0)
Monocytes Absolute: 0.6 10*3/uL (ref 0.1–1.0)
Monocytes Relative: 6.7 % (ref 3.0–12.0)
Neutro Abs: 6.1 10*3/uL (ref 1.4–7.7)
Neutrophils Relative %: 73.5 % (ref 43.0–77.0)
Platelets: 238 10*3/uL (ref 150.0–400.0)
RBC: 4.62 Mil/uL (ref 4.22–5.81)
RDW: 16 % — ABNORMAL HIGH (ref 11.5–15.5)
WBC: 8.4 10*3/uL (ref 4.0–10.5)

## 2021-02-01 LAB — COMPREHENSIVE METABOLIC PANEL
ALT: 13 U/L (ref 0–53)
AST: 18 U/L (ref 0–37)
Albumin: 3.8 g/dL (ref 3.5–5.2)
Alkaline Phosphatase: 87 U/L (ref 39–117)
BUN: 16 mg/dL (ref 6–23)
CO2: 30 mEq/L (ref 19–32)
Calcium: 9.2 mg/dL (ref 8.4–10.5)
Chloride: 102 mEq/L (ref 96–112)
Creatinine, Ser: 1.18 mg/dL (ref 0.40–1.50)
GFR: 59.84 mL/min — ABNORMAL LOW (ref >60.00)
Glucose, Bld: 147 mg/dL — ABNORMAL HIGH (ref 70–99)
Potassium: 4 mEq/L (ref 3.5–5.1)
Sodium: 139 mEq/L (ref 135–145)
Total Bilirubin: 0.6 mg/dL (ref 0.2–1.2)
Total Protein: 7.1 g/dL (ref 6.0–8.3)

## 2021-02-01 LAB — PSA: Prostatic Specific Antigen: 0.09 ng/mL (ref 0.00–4.00)

## 2021-02-01 NOTE — Telephone Encounter (Signed)
Trying to get him an oxygen concentrator. Will have to have another ambulatory oxygen test. Will he need an OV or just a NV?

## 2021-02-01 NOTE — Telephone Encounter (Signed)
On 01/15/2021 his presenting oxygen was 84% on RA - will this qualify him?

## 2021-02-01 NOTE — Progress Notes (Signed)
Patient ID: [name], male    DOB: 09-08-1946, 75 y.o.   MRN: [id number]  This visit was conducted in person.  BP 120/70   Pulse (!) 40   Temp 97.7 F (36.5 C) (Temporal)   Ht 6' (1.829 m)   Wt 222 lb 8 oz (100.9 kg)   SpO2 91%   BMI 30.18 kg/m    CC: stomach pain, no appetite Subjective:   HPI: RONIE Moore is a 75 y.o. male presenting on 02/01/2021 for Abdominal Pain (C/o abd achiness and loss of appetite, improved.  Pt accompanied by wife, [name]- temp 97.9.)   Wife notices loss of appetite, progressive weakness, and abdominal discomfort briefly last week, now resolved. he continues feeling unsteady with standing. Syncopal episode x1 this past week. Not post-ictal, no bowel/bladder incontinence.   13 lb weight loss in 2 wks.   Continues passing out - despite midodrine use. Home BP readings running 494-496 systolic.  Has not received portable concentrator - uses [facility]. He has large home concentrator that is not portable.   No urinary changes - denies dysuria, urgency, frequency, feels like he fully empties. He has stopped the flomax.  He has stopped gabapentin.  He states he's never used CPAP.      Relevant past medical, surgical, family and social history reviewed and updated as indicated. Interim medical history since our last visit reviewed. Allergies and medications reviewed and updated. Outpatient Medications Prior to Visit  Medication Sig Dispense Refill  . albuterol (VENTOLIN HFA) 108 (90 Base) MCG/ACT inhaler Inhale 2 puffs into the lungs every 6 (six) hours as needed for wheezing or shortness of breath. 18 g 11  . amiodarone (PACERONE) 200 MG tablet Take 1.5 tablets (300 mg total) by mouth daily. 135 tablet 3  . Cholecalciferol (VITAMIN D) 50 MCG (2000 UT) CAPS Take 1 capsule (2,000 Units total) by mouth daily. 30 capsule   . Coenzyme Q10 (COQ-10 PO) Take 1 tablet by mouth daily.    . Cyanocobalamin (VITAMIN B-12) 500 MCG TABS Take 500 mcg by mouth daily.     . diphenoxylate-atropine (LOMOTIL) 2.5-0.025 MG tablet Take 1 tablet by mouth 2 (two) times daily as needed. 20 tablet 1  . fish oil-omega-3 fatty acids 1000 MG capsule Take 2 g by mouth daily.    . furosemide (LASIX) 20 MG tablet TAKE 1 TABLET BY MOUTH EVERY DAY AS NEEDED FOR FLUID RETENTION 90 tablet 0  . HYDROcodone-acetaminophen (NORCO) 10-325 MG tablet Take 1 tablet by mouth every 6 (six) hours as needed for moderate pain or severe pain. 120 tablet 0  . ipratropium-albuterol (DUONEB) 0.5-2.5 (3) MG/3ML SOLN Take 3 mLs by nebulization every 6 (six) hours as needed. 360 mL 11  . metFORMIN (GLUCOPHAGE-XR) 500 MG 24 hr tablet Take 500 mg by mouth 2 (two) times daily.    . methocarbamol (ROBAXIN) 750 MG tablet TAKE 1 TABLET(750 MG) BY MOUTH TWICE DAILY 180 tablet 0  . midodrine (PROAMATINE) 10 MG tablet Take 1 tablet (10 mg total) by mouth 3 (three) times daily as needed. 90 tablet 1  . nitroGLYCERIN (NITROLINGUAL) 0.4 MG/SPRAY spray USE 1 SPRAY AS DIRECTED EVERY 5 MINUTES AS NEEDED 4.9 g 1  . omeprazole (PRILOSEC) 40 MG capsule Take 1 capsule (40 mg total) by mouth daily. 90 capsule 3  . pravastatin (PRAVACHOL) 20 MG tablet TAKE 1 TABLET(20 MG) BY MOUTH DAILY 90 tablet 3  . warfarin (COUMADIN) 5 MG tablet TAKE AS DIRECTED FOR ANTI-COAG  CLINIC 180 tablet 0  . gabapentin (NEURONTIN) 600 MG tablet Take 0.5 tablets (300 mg total) by mouth daily.    . tamsulosin (FLOMAX) 0.4 MG CAPS capsule Take 1 capsule (0.4 mg total) by mouth daily after supper. 30 capsule 6   No facility-administered medications prior to visit.     Per HPI unless specifically indicated in ROS section below Review of Systems Objective:  BP 120/70   Pulse (!) 40   Temp 97.7 F (36.5 C) (Temporal)   Ht 6' (1.829 m)   Wt 222 lb 8 oz (100.9 kg)   SpO2 91%   BMI 30.18 kg/m   Wt Readings from Last 3 Encounters:  02/01/21 222 lb 8 oz (100.9 kg)  01/21/21 235 lb 4 oz (106.7 kg)  01/18/21 239 lb (108.4 kg)      Physical  Exam Vitals and nursing note reviewed.  Constitutional:      Appearance: Normal appearance. He is not ill-appearing.  Cardiovascular:     Rate and Rhythm: Regular rhythm. Bradycardia present.     Pulses: Normal pulses.     Heart sounds: Normal heart sounds. No murmur heard.   Pulmonary:     Effort: Pulmonary effort is normal. No respiratory distress.     Breath sounds: Normal breath sounds. No wheezing, rhonchi or rales.  Abdominal:     General: Bowel sounds are normal. There is no distension.     Palpations: Abdomen is soft. There is no mass.     Tenderness: There is no abdominal tenderness. There is no guarding or rebound.     Hernia: No hernia is present.  Musculoskeletal:     Right lower leg: No edema.     Left lower leg: No edema.  Skin:    General: Skin is warm and dry.  Neurological:     Mental Status: He is alert.     Comments: 5/5 BLE strength  Psychiatric:        Mood and Affect: Mood normal.        Behavior: Behavior normal.       Results for orders placed or performed in visit on 02/01/21  Comprehensive metabolic panel  Result Value Ref Range   Sodium 139 135 - 145 mEq/L   Potassium 4.0 3.5 - 5.1 mEq/L   Chloride 102 96 - 112 mEq/L   CO2 30 19 - 32 mEq/L   Glucose, Bld 147 (H) 70 - 99 mg/dL   BUN 16 6 - 23 mg/dL   Creatinine, Ser 1.18 0.40 - 1.50 mg/dL   Total Bilirubin 0.6 0.2 - 1.2 mg/dL   Alkaline Phosphatase 87 39 - 117 U/L   AST 18 0 - 37 U/L   ALT 13 0 - 53 U/L   Total Protein 7.1 6.0 - 8.3 g/dL   Albumin 3.8 3.5 - 5.2 g/dL   GFR 59.84 (L) >60.00 mL/min   Calcium 9.2 8.4 - 10.5 mg/dL  CBC with Differential/Platelet  Result Value Ref Range   WBC 8.4 4.0 - 10.5 K/uL   RBC 4.62 4.22 - 5.81 Mil/uL   Hemoglobin 14.0 13.0 - 17.0 g/dL   HCT 42.0 39.0 - 52.0 %   MCV 90.8 78.0 - 100.0 fl   MCHC 33.3 30.0 - 36.0 g/dL   RDW 16.0 (H) 11.5 - 15.5 %   Platelets 238.0 150.0 - 400.0 K/uL   Neutrophils Relative % 73.5 43.0 - 77.0 %   Lymphocytes Relative  17.4 12.0 - 46.0 %   Monocytes  Relative 6.7 3.0 - 12.0 %   Eosinophils Relative 2.0 0.0 - 5.0 %   Basophils Relative 0.4 0.0 - 3.0 %   Neutro Abs 6.1 1.4 - 7.7 K/uL   Lymphs Abs 1.5 0.7 - 4.0 K/uL   Monocytes Absolute 0.6 0.1 - 1.0 K/uL   Eosinophils Absolute 0.2 0.0 - 0.7 K/uL   Basophils Absolute 0.0 0.0 - 0.1 K/uL   *Note: Due to a large number of results and/or encounters for the requested time period, some results have not been displayed. A complete set of results can be found in Results Review.   Lab Results  Component Value Date   TSH 4.47 01/15/2021    Assessment & Plan:  This visit occurred during the SARS-CoV-2 public health emergency.  Safety protocols were in place, including screening questions prior to the visit, additional usage of staff PPE, and extensive cleaning of exam room while observing appropriate contact time as indicated for disinfecting solutions.   Problem List Items Addressed This Visit    Unexplained weight loss    Weight fluctuates significantly, noted weight loss trend of 17 lbs over last several weeks.  Check labs today.       Syncope due to orthostatic hypotension - Primary    Ongoing. He's stopped gabapentin and flomax in an effort to minimize meds that could contribute.  He's using midodrin PRN low blood pressures, a few times a week. Endorses PM BP readings elevated. Discussed regular use of midodrine in an effort to prevent further orthostatic syncope, discussed treating standing blood pressures instead of seated BP. Will continue working towards portable oxygen concentrator.       OSA (obstructive sleep apnea)    He states he's never used CPAP. Previously refused CPAP, refused sleep study, refused home sleep test - now may agree to CPAP. Discussed return to sleep doctor. He is now seeing pulm Dr Patsey Berthold.       Acute on chronic respiratory failure with hypoxia (HCC)    Will again request lightweight portable oxygen concentrator.       Abdominal pain    Isolated episode last week, now resolved. Benign exam today.           No orders of the defined types were placed in this encounter.  Orders Placed This Encounter  Procedures  . Comprehensive metabolic panel  . CBC with Differential/Platelet    Patient Instructions  Labs today Stay off flomax.  Stay off gabapentin.  I will provide prescription for portable oxygen concentrator.   Follow up plan: Return if symptoms worsen or fail to improve.  Ria Bush, MD

## 2021-02-01 NOTE — Patient Instructions (Addendum)
Labs today Stay off flomax.  Stay off gabapentin.  I will provide prescription for portable oxygen concentrator.

## 2021-02-02 NOTE — Assessment & Plan Note (Signed)
Ongoing. He's stopped gabapentin and flomax in an effort to minimize meds that could contribute.  He's using midodrin PRN low blood pressures, a few times a week. Endorses PM BP readings elevated. Discussed regular use of midodrine in an effort to prevent further orthostatic syncope, discussed treating standing blood pressures instead of seated BP. Will continue working towards portable oxygen concentrator.

## 2021-02-02 NOTE — Assessment & Plan Note (Addendum)
Weight fluctuates significantly, noted weight loss trend of 17 lbs over last several weeks.  Check labs today.

## 2021-02-02 NOTE — Assessment & Plan Note (Addendum)
He states he's never used CPAP. Previously refused CPAP, refused sleep study, refused home sleep test - now may agree to CPAP. Discussed return to sleep doctor. He is now seeing pulm Dr Patsey Berthold.

## 2021-02-02 NOTE — Assessment & Plan Note (Signed)
Will again request lightweight portable oxygen concentrator.

## 2021-02-02 NOTE — Assessment & Plan Note (Signed)
Isolated episode last week, now resolved. Benign exam today.

## 2021-02-04 NOTE — Telephone Encounter (Signed)
He is going to his pulmonologist on 02-06-21. She will find out if the 84% we have will work. If he needs ambulatory, pulmonary should be able to do that.

## 2021-02-05 DIAGNOSIS — Z6832 Body mass index (BMI) 32.0-32.9, adult: Secondary | ICD-10-CM | POA: Diagnosis not present

## 2021-02-05 DIAGNOSIS — K4020 Bilateral inguinal hernia, without obstruction or gangrene, not specified as recurrent: Secondary | ICD-10-CM | POA: Diagnosis not present

## 2021-02-06 ENCOUNTER — Ambulatory Visit (INDEPENDENT_AMBULATORY_CARE_PROVIDER_SITE_OTHER): Payer: Medicare Other | Admitting: Pulmonary Disease

## 2021-02-06 ENCOUNTER — Encounter: Payer: Self-pay | Admitting: Pulmonary Disease

## 2021-02-06 ENCOUNTER — Other Ambulatory Visit
Admission: RE | Admit: 2021-02-06 | Discharge: 2021-02-06 | Disposition: A | Discharge status: Home / Self Care | Payer: Medicare Other | Attending: Internal Medicine | Admitting: Internal Medicine

## 2021-02-06 ENCOUNTER — Other Ambulatory Visit: Payer: Self-pay

## 2021-02-06 ENCOUNTER — Telehealth: Payer: Self-pay | Admitting: Internal Medicine

## 2021-02-06 ENCOUNTER — Ambulatory Visit (INDEPENDENT_AMBULATORY_CARE_PROVIDER_SITE_OTHER): Payer: Medicare Other

## 2021-02-06 VITALS — BP 122/68 | HR 46 | Temp 97.7°F | Ht 72.0 in | Wt 222.0 lb

## 2021-02-06 DIAGNOSIS — F17210 Nicotine dependence, cigarettes, uncomplicated: Secondary | ICD-10-CM | POA: Diagnosis not present

## 2021-02-06 DIAGNOSIS — R791 Abnormal coagulation profile: Secondary | ICD-10-CM

## 2021-02-06 DIAGNOSIS — I4891 Unspecified atrial fibrillation: Secondary | ICD-10-CM | POA: Insufficient documentation

## 2021-02-06 DIAGNOSIS — Z5181 Encounter for therapeutic drug level monitoring: Secondary | ICD-10-CM | POA: Diagnosis not present

## 2021-02-06 DIAGNOSIS — Z9119 Patient's noncompliance with other medical treatment and regimen: Secondary | ICD-10-CM | POA: Diagnosis not present

## 2021-02-06 DIAGNOSIS — Z91199 Patient's noncompliance with other medical treatment and regimen due to unspecified reason: Secondary | ICD-10-CM

## 2021-02-06 DIAGNOSIS — J9611 Chronic respiratory failure with hypoxia: Secondary | ICD-10-CM | POA: Diagnosis not present

## 2021-02-06 DIAGNOSIS — J449 Chronic obstructive pulmonary disease, unspecified: Secondary | ICD-10-CM

## 2021-02-06 DIAGNOSIS — R55 Syncope and collapse: Secondary | ICD-10-CM

## 2021-02-06 DIAGNOSIS — I495 Sick sinus syndrome: Secondary | ICD-10-CM

## 2021-02-06 LAB — PROTIME-INR
INR: 6.1 (ref 0.8–1.2)
Prothrombin Time: 52.4 seconds — ABNORMAL HIGH (ref 11.4–15.2)

## 2021-02-06 LAB — POCT INR: INR: 8 — AB (ref 2.0–3.0)

## 2021-02-06 MED ORDER — BREZTRI AEROSPHERE 160-9-4.8 MCG/ACT IN AERO: 2.0000 | INHALATION_SPRAY | Freq: Two times a day (BID) | RESPIRATORY_TRACT | 0 refills | Status: DC

## 2021-02-06 NOTE — Telephone Encounter (Signed)
Please see anti-coag for today.

## 2021-02-06 NOTE — Patient Instructions (Addendum)
Spoke w/ pt.  Advised him to: - have a serving of greens today - skip warfarin tonight, tomorrow and Friday, then - START NEW dosage of warfarin 1/2 tablet every day - recheck INR in 1 week
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
## 2021-02-06 NOTE — Patient Instructions (Signed)
We are going to give you another trial of Breztri 2 puffs twice a day.  Rinse your mouth well after you use it.  We will provide you with the forms for [employer] which is the maker of [name] so that you can get medication assistance.   You have qualified for oxygen at 2 L/min.  You should use this at all times.   We will see you in follow-up in 4 months time call sooner should any new problems arise.

## 2021-02-06 NOTE — Telephone Encounter (Signed)
[facility] calling with critical lab result.

## 2021-02-07 ENCOUNTER — Other Ambulatory Visit: Payer: Self-pay | Admitting: Family Medicine

## 2021-02-07 ENCOUNTER — Telehealth: Payer: Self-pay

## 2021-02-07 DIAGNOSIS — G894 Chronic pain syndrome: Secondary | ICD-10-CM

## 2021-02-07 NOTE — Telephone Encounter (Signed)
Name of Medication: Hydrocodone-APAP Name of Pharmacy: [facility] Last Fill or Written Date and Quantity: 01/04/21, #120 Last Office Visit and Type: 02/01/21, syncope Next Office Visit and Type: 04/08/21, AWV prt 2 Last Controlled Substance Agreement Date: 02/03/18 Last UDS: 07/06/17
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
## 2021-02-08 ENCOUNTER — Encounter: Payer: Self-pay | Admitting: Radiation Oncology

## 2021-02-08 ENCOUNTER — Other Ambulatory Visit: Payer: Self-pay | Admitting: *Deleted

## 2021-02-08 ENCOUNTER — Other Ambulatory Visit: Payer: Self-pay

## 2021-02-08 ENCOUNTER — Ambulatory Visit
Admission: RE | Admit: 2021-02-08 | Discharge: 2021-02-08 | Disposition: A | Discharge status: Home / Self Care | Payer: Medicare Other | Source: Ambulatory Visit | Attending: Radiation Oncology | Admitting: Radiation Oncology

## 2021-02-08 VITALS — BP 155/139 | HR 45 | Temp 94.8°F | Wt 225.0 lb

## 2021-02-08 DIAGNOSIS — C61 Malignant neoplasm of prostate: Secondary | ICD-10-CM | POA: Diagnosis not present

## 2021-02-08 DIAGNOSIS — Z08 Encounter for follow-up examination after completed treatment for malignant neoplasm: Secondary | ICD-10-CM | POA: Diagnosis not present

## 2021-02-08 NOTE — Progress Notes (Signed)
Radiation Oncology Follow up Note  Name: [name]   Date:   02/08/2021 MRN:  [id number] DOB: 1946/02/13    This 75 y.o. male presents to the clinic today for 2-year follow-up status post IMRT image guided radiation therapy for Gleason 7 (3+4) adenocarcinoma.Marland Kitchen  REFERRING PROVIDER: Ria Bush, MD  HPI: Patient is a 75 year old male now out 2 years having completed image guided IMRT radiation therapy for Gleason 7 adenocarcinoma the prostate presenting with a PSA of 9.7.  Seen today in routine follow-up he is doing well.  Specifically denies any increased lower urinary tract tract symptoms diarrhea or fatigue.  His PSA remains excellent at 0.09 slight uptake from 0.03 approximately a year ago..  COMPLICATIONS OF TREATMENT: none  FOLLOW UP COMPLIANCE: keeps appointments   PHYSICAL EXAM:  BP (!) 155/139   Pulse (!) 45   Temp (!) 94.8 F (34.9 C) (Tympanic)   Wt 225 lb (102.1 kg)   SpO2 99% Comment: room air  BMI 30.52 kg/m  Well-developed well-nourished patient in NAD. HEENT reveals PERLA, EOMI, discs not visualized.  Oral cavity is clear. No oral mucosal lesions are identified. Neck is clear without evidence of cervical or supraclavicular adenopathy. Lungs are clear to A&P. Cardiac examination is essentially unremarkable with regular rate and rhythm without murmur rub or thrill. Abdomen is benign with no organomegaly or masses noted. Motor sensory and DTR levels are equal and symmetric in the upper and lower extremities. Cranial nerves II through XII are grossly intact. Proprioception is intact. No peripheral adenopathy or edema is identified. No motor or sensory levels are noted. Crude visual fields are within normal range.  RADIOLOGY RESULTS: No current films for review  PLAN: Present time patient is doing well under excellent biochemical control of his prostate cancer.  I am pleased with his overall progress.  I have asked to see him back in 1 year with a follow-up PSA at that  time.  Patient knows to call with any concerns at any time.  I would like to take this opportunity to thank you for allowing me to participate in the care of your patient.Noreene Filbert, MD

## 2021-02-09 MED ORDER — HYDROCODONE-ACETAMINOPHEN 10-325 MG PO TABS: 1.0000 | ORAL_TABLET | Freq: Four times a day (QID) | ORAL | 0 refills | Status: DC | PRN

## 2021-02-09 NOTE — Telephone Encounter (Signed)
ERx 

## 2021-02-13 ENCOUNTER — Other Ambulatory Visit: Payer: Self-pay

## 2021-02-13 ENCOUNTER — Ambulatory Visit (INDEPENDENT_AMBULATORY_CARE_PROVIDER_SITE_OTHER): Payer: Medicare Other

## 2021-02-13 DIAGNOSIS — Z5181 Encounter for therapeutic drug level monitoring: Secondary | ICD-10-CM | POA: Diagnosis not present

## 2021-02-13 DIAGNOSIS — I4891 Unspecified atrial fibrillation: Secondary | ICD-10-CM | POA: Diagnosis not present

## 2021-02-13 LAB — POCT INR: INR: 3.4 — AB (ref 2.0–3.0)

## 2021-02-13 NOTE — Patient Instructions (Signed)
Since you've already taken warfarin today, SKIP IT TOMORROW, then - START NEW dosage of warfarin 1/2 tablet every day - recheck INR in 2 weeks

## 2021-02-22 ENCOUNTER — Telehealth: Payer: Self-pay | Admitting: Cardiovascular Disease

## 2021-02-22 ENCOUNTER — Ambulatory Visit: Payer: Medicare Other | Admitting: Cardiovascular Disease
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
## 2021-02-22 NOTE — Telephone Encounter (Signed)
Per Dr. Rockey Situ, the patient is on the schedule for 4:20 pm this afternoon to review the results of his ZIO monitor. However, the monitor has not resulted yet. I have checked the ZIO website and per tracking, it looks like the monitor is still in transit to [facility] as of 02/12/21.  Per Dr. Rockey Situ, the patient does not need to come in this afternoon. He will either need to: 1) r/s for a couple of weeks out so the monitor can result 2) ideally, we would just call him with the results of the monitor and decide if he needs EP follow up or note based on the findings  I have attempted to call the patient. I left a detailed message on his voice mail (ok per DPR) that we need to cancel his appointment for this afternoon as his monitor has not resulted.  I have asked him to please call back so we may r/s or see if he is ok with [place] just calling him when the monitor results.  To scheduling as an FYI.

## 2021-02-22 NOTE — Telephone Encounter (Signed)
Per secure chat message received from scheduling- the patient's wife called back and confirmed the cancellation of his appointment this afternoon.  He has not rescheduled, so I will send a message to Dr. Donivan Scull nurse to please look out for when his ZIO results. The patient will need to be called at that time with a further plan of care per Dr. Rockey Situ.

## 2021-02-25 NOTE — Telephone Encounter (Signed)
ZIO monitor still in transit to [facility], according to tracking history:  February 12, 2021, 12:04 am Departed Kite  Your item departed our [place] facility in [place] on February 12, 2021 at 12:04 am. The item is currently in transit to the destination.  Once monitor arrives to [facility] and is read we will call with results and f/u appt.

## 2021-02-27 ENCOUNTER — Ambulatory Visit (INDEPENDENT_AMBULATORY_CARE_PROVIDER_SITE_OTHER): Payer: Medicare Other

## 2021-02-27 ENCOUNTER — Other Ambulatory Visit: Payer: Self-pay

## 2021-02-27 DIAGNOSIS — I4891 Unspecified atrial fibrillation: Secondary | ICD-10-CM | POA: Diagnosis not present

## 2021-02-27 DIAGNOSIS — Z5181 Encounter for therapeutic drug level monitoring: Secondary | ICD-10-CM

## 2021-02-27 LAB — POCT INR: INR: 3.5 — AB (ref 2.0–3.0)

## 2021-02-27 NOTE — Patient Instructions (Signed)
Since you've already taken warfarin today, SKIP IT TOMORROW AND Friday, then  - resume dosage of warfarin 1/2 tablet every day - recheck INR in 2 weeks

## 2021-03-06 ENCOUNTER — Other Ambulatory Visit: Payer: Self-pay | Admitting: Family Medicine

## 2021-03-06 MED ORDER — GABAPENTIN 600 MG PO TABS: 300.0000 mg | ORAL_TABLET | Freq: Every evening | ORAL | 3 refills | Status: DC | PRN

## 2021-03-06 NOTE — Telephone Encounter (Signed)
Received refill request for Gabapentin. Medication was discontinued by Dr. Silvio Pate on 01/03/21. See office notes. Called and spoke to patient's wife [name] ( on [place]) and was advised that her husband told her that he has started back taking the Gabapentin as needed. Patient's wife stated that he started it back because he has leg pain sometimes at night. Patient's wife also wanted to let Dr. Danise Mina know that her husband blood sugar was up a little this morning but he was eating at the time that he took it.  [name] stated that his blood sugar was 235 and he was a little shaky.

## 2021-03-06 NOTE — Telephone Encounter (Signed)
Rx sent in to take 300-600mg  QHS PRN.

## 2021-03-11 ENCOUNTER — Ambulatory Visit (INDEPENDENT_AMBULATORY_CARE_PROVIDER_SITE_OTHER): Payer: Medicare Other | Admitting: Family Medicine

## 2021-03-11 ENCOUNTER — Other Ambulatory Visit: Payer: Self-pay

## 2021-03-11 ENCOUNTER — Encounter: Payer: Self-pay | Admitting: Family Medicine

## 2021-03-11 VITALS — BP 126/64 | HR 51 | Temp 97.9°F | Ht 72.0 in | Wt 218.4 lb

## 2021-03-11 DIAGNOSIS — G4733 Obstructive sleep apnea (adult) (pediatric): Secondary | ICD-10-CM

## 2021-03-11 DIAGNOSIS — R2689 Other abnormalities of gait and mobility: Secondary | ICD-10-CM

## 2021-03-11 DIAGNOSIS — R0600 Dyspnea, unspecified: Secondary | ICD-10-CM

## 2021-03-11 DIAGNOSIS — R634 Abnormal weight loss: Secondary | ICD-10-CM

## 2021-03-11 DIAGNOSIS — R0609 Other forms of dyspnea: Secondary | ICD-10-CM

## 2021-03-11 DIAGNOSIS — J449 Chronic obstructive pulmonary disease, unspecified: Secondary | ICD-10-CM

## 2021-03-11 DIAGNOSIS — R251 Tremor, unspecified: Secondary | ICD-10-CM

## 2021-03-11 DIAGNOSIS — R21 Rash and other nonspecific skin eruption: Secondary | ICD-10-CM | POA: Diagnosis not present

## 2021-03-11 DIAGNOSIS — J9621 Acute and chronic respiratory failure with hypoxia: Secondary | ICD-10-CM

## 2021-03-11 DIAGNOSIS — I25118 Atherosclerotic heart disease of native coronary artery with other forms of angina pectoris: Secondary | ICD-10-CM

## 2021-03-11 NOTE — Progress Notes (Signed)
Patient ID: [name], male    DOB: 1946-11-11, 75 y.o.   MRN: [id number]  This visit was conducted in person.  BP 126/64   Pulse (!) 51   Temp 97.9 F (36.6 C) (Temporal)   Ht 6' (1.829 m)   Wt 218 lb 6 oz (99.1 kg)   SpO2 (!) 88%   BMI 29.62 kg/m    CC: worsening hand tremors  Subjective:   HPI: [name] is a 75 y.o. male presenting on 03/11/2021 for Tremors (C/o worsening hand/feet tremors.  Pt accompanied by wife, [name]- temp 98.0.)   Now has portable oxygen concentrator ([employer]) - didn't bring in today. Pulse ox stays 88% in office at rest. Discussed oxygen use. He occasionally uses at night time.   Notes increased daytime somnolence - taking more naps during the day. He also sleeps at night time.   13 lb weight loss in the past month. Another 4 lbs down since last visit.  More unsteady, notes increased hand tremors. Orthostatic imbalance.   COPD - was on breztri 2 puffs bid - stopped because "it was affecting my lungs". Now using albuterol PRN - taking 5 times a day.      Relevant past medical, surgical, family and social history reviewed and updated as indicated. Interim medical history since our last visit reviewed. Allergies and medications reviewed and updated. Outpatient Medications Prior to Visit  Medication Sig Dispense Refill  . HYDROcodone-acetaminophen (NORCO) 10-325 MG tablet Take 1 tablet by mouth every 6 (six) hours as needed for moderate pain or severe pain. 120 tablet 0  . albuterol (VENTOLIN HFA) 108 (90 Base) MCG/ACT inhaler Inhale 2 puffs into the lungs every 6 (six) hours as needed for wheezing or shortness of breath. 18 g 11  . amiodarone (PACERONE) 200 MG tablet Take 1.5 tablets (300 mg total) by mouth daily. 135 tablet 3  . Budeson-Glycopyrrol-Formoterol (BREZTRI AEROSPHERE) 160-9-4.8 MCG/ACT AERO Inhale 2 puffs into the lungs in the morning and at bedtime. 5.9 g 0  . Cholecalciferol (VITAMIN D) 50 MCG (2000 UT) CAPS Take 1 capsule (2,000  Units total) by mouth daily. 30 capsule   . Coenzyme Q10 (COQ-10 PO) Take 1 tablet by mouth daily.    . Cyanocobalamin (VITAMIN B-12) 500 MCG TABS Take 500 mcg by mouth daily.    . diphenoxylate-atropine (LOMOTIL) 2.5-0.025 MG tablet Take 1 tablet by mouth 2 (two) times daily as needed. 20 tablet 1  . fish oil-omega-3 fatty acids 1000 MG capsule Take 2 g by mouth daily.    . furosemide (LASIX) 20 MG tablet TAKE 1 TABLET BY MOUTH EVERY DAY AS NEEDED FOR FLUID RETENTION 90 tablet 0  . gabapentin (NEURONTIN) 600 MG tablet Take 0.5-1 tablets (300-600 mg total) by mouth at bedtime as needed (sleep/leg pain). 30 tablet 3  . ipratropium-albuterol (DUONEB) 0.5-2.5 (3) MG/3ML SOLN Take 3 mLs by nebulization every 6 (six) hours as needed. 360 mL 11  . metFORMIN (GLUCOPHAGE-XR) 500 MG 24 hr tablet Take 500 mg by mouth 2 (two) times daily.    . methocarbamol (ROBAXIN) 750 MG tablet TAKE 1 TABLET(750 MG) BY MOUTH TWICE DAILY 180 tablet 0  . midodrine (PROAMATINE) 10 MG tablet Take 1 tablet (10 mg total) by mouth 3 (three) times daily as needed. 90 tablet 1  . nitroGLYCERIN (NITROLINGUAL) 0.4 MG/SPRAY spray USE 1 SPRAY AS DIRECTED EVERY 5 MINUTES AS NEEDED 4.9 g 1  . omeprazole (PRILOSEC) 40 MG capsule Take 1 capsule (  40 mg total) by mouth daily. 90 capsule 3  . pravastatin (PRAVACHOL) 20 MG tablet TAKE 1 TABLET(20 MG) BY MOUTH DAILY 90 tablet 3  . warfarin (COUMADIN) 5 MG tablet TAKE AS DIRECTED FOR ANTI-COAG CLINIC 180 tablet 0   No facility-administered medications prior to visit.     Per HPI unless specifically indicated in ROS section below Review of Systems Objective:  BP 126/64   Pulse (!) 51   Temp 97.9 F (36.6 C) (Temporal)   Ht 6' (1.829 m)   Wt 218 lb 6 oz (99.1 kg)   SpO2 (!) 88%   BMI 29.62 kg/m   Wt Readings from Last 3 Encounters:  03/11/21 218 lb 6 oz (99.1 kg)  02/08/21 225 lb (102.1 kg)  02/06/21 222 lb (100.7 kg)      Physical Exam Vitals and nursing note reviewed.   Constitutional:      Appearance: Normal appearance. He is not ill-appearing.  Cardiovascular:     Rate and Rhythm: Regular rhythm. Bradycardia present.     Pulses: Normal pulses.     Heart sounds: Normal heart sounds. No murmur heard.   Pulmonary:     Effort: Pulmonary effort is normal. No respiratory distress.     Breath sounds: No wheezing, rhonchi or rales.     Comments: Coarse breath sounds without wheezing Musculoskeletal:     Right lower leg: No edema.     Left lower leg: No edema.     Comments: 1-2+ DP bilaterally  Skin:    General: Skin is warm and dry.     Findings: Rash present.     Comments: Blanching papular rash on dorsal feet bilaterally, tender to palpation  Neurological:     Mental Status: He is alert.  Psychiatric:        Mood and Affect: Mood normal.        Behavior: Behavior normal.       Results for orders placed or performed in visit on 02/27/21  POCT INR  Result Value Ref Range   INR 3.5 (A) 2.0 - 3.0   *Note: Due to a large number of results and/or encounters for the requested time period, some results have not been displayed. A complete set of results can be found in Results Review.   Assessment & Plan:  This visit occurred during the SARS-CoV-2 public health emergency.  Safety protocols were in place, including screening questions prior to the visit, additional usage of staff PPE, and extensive cleaning of exam room while observing appropriate contact time as indicated for disinfecting solutions.   Problem List Items Addressed This Visit    COPD mixed type ([place])    Stopped breztri but unclear reason why - back only on albuterol.  Again reviewed rescue vs controller medication.  Reviewed importance of breztri - rec retrial. Update if truly intolerant to start different controller medication.       Dyspnea on exertion   Unexplained weight loss    Ongoing. Anticipate multifactorial. Had reassuring evaluation 2019 with pan CT imaging.        OSA (obstructive sleep apnea)    Will need to f/u with pulm on OSA and consideration for treatment.       Imbalance    Anticipate multifactorial cause including hypoxia.       Skin rash    Unclear cause - anticipate CVI related - rec compression stockings, leg elevation, etc Good pedal pulses.       Acute on chronic respiratory  failure with hypoxia ([place])    Has new portable oxygen concentrator however not using. Discussed importance of regularly using supplemental oxygen.       Tremor - Primary    Progressive, likely multifactorial - discussed contributing factors including hypoxia and albuterol overuse.           No orders of the defined types were placed in this encounter.  No orders of the defined types were placed in this encounter.   Patient Instructions  Retry breztri daily - goal is to use albuterol only as needed, not scheduled or daily.  Look into patient assistance for this medicine if tolerated.  If not tolerated, let me or Dr Patsey Berthold know.  Low oxygen or too much albuterol can both contribute to tremors.  Keep legs elevated. Consider lotrimin and/or compression stocking use to see if this will help.   Follow up plan: Return if symptoms worsen or fail to improve.  Ria Bush, MD

## 2021-03-11 NOTE — Patient Instructions (Addendum)
Retry breztri daily - goal is to use albuterol only as needed, not scheduled or daily.  Look into patient assistance for this medicine if tolerated.  If not tolerated, let me or Dr Patsey Berthold know.  Low oxygen or too much albuterol can both contribute to tremors.  Keep legs elevated. Consider lotrimin and/or compression stocking use to see if this will help.

## 2021-03-12 ENCOUNTER — Other Ambulatory Visit: Payer: Self-pay | Admitting: Family Medicine

## 2021-03-12 DIAGNOSIS — G894 Chronic pain syndrome: Secondary | ICD-10-CM

## 2021-03-13 ENCOUNTER — Other Ambulatory Visit: Payer: Self-pay

## 2021-03-13 ENCOUNTER — Ambulatory Visit (INDEPENDENT_AMBULATORY_CARE_PROVIDER_SITE_OTHER): Payer: Medicare Other

## 2021-03-13 ENCOUNTER — Other Ambulatory Visit: Payer: Self-pay | Admitting: Family Medicine

## 2021-03-13 DIAGNOSIS — I4891 Unspecified atrial fibrillation: Secondary | ICD-10-CM

## 2021-03-13 DIAGNOSIS — Z5181 Encounter for therapeutic drug level monitoring: Secondary | ICD-10-CM

## 2021-03-13 DIAGNOSIS — G894 Chronic pain syndrome: Secondary | ICD-10-CM

## 2021-03-13 DIAGNOSIS — R251 Tremor, unspecified: Secondary | ICD-10-CM | POA: Insufficient documentation

## 2021-03-13 LAB — POCT INR: INR: 4.6 — AB (ref 2.0–3.0)

## 2021-03-13 MED ORDER — WARFARIN SODIUM 2.5 MG PO TABS: ORAL_TABLET | ORAL | 1 refills | Status: DC

## 2021-03-13 NOTE — Assessment & Plan Note (Signed)
Stopped breztri but unclear reason why - back only on albuterol.  Again reviewed rescue vs controller medication.  Reviewed importance of breztri - rec retrial. Update if truly intolerant to start different controller medication.

## 2021-03-13 NOTE — Assessment & Plan Note (Signed)
Will need to f/u with pulm on OSA and consideration for treatment.

## 2021-03-13 NOTE — Assessment & Plan Note (Addendum)
Ongoing. Anticipate multifactorial. Had reassuring evaluation 2019 with pan CT imaging.

## 2021-03-13 NOTE — Assessment & Plan Note (Signed)
Progressive, likely multifactorial - discussed contributing factors including hypoxia and albuterol overuse.

## 2021-03-13 NOTE — Patient Instructions (Signed)
Since you've already taken warfarin today, SKIP IT TOMORROW AND Friday, then  +++I AM SENDING YOU IN A  PRESCRIPTION FOR NEW PILLS+++ - on Saturday, START NEW DOSAGE OF 1 (2.5 mg) EVERY DAY EXCEPT 1/2 tablet  (1.25 mg) on [place]. -Recheck INR in 2 weeks.

## 2021-03-13 NOTE — Assessment & Plan Note (Signed)
Unclear cause - anticipate CVI related - rec compression stockings, leg elevation, etc Good pedal pulses.

## 2021-03-13 NOTE — Assessment & Plan Note (Signed)
Anticipate multifactorial cause including hypoxia.

## 2021-03-13 NOTE — Telephone Encounter (Signed)
Pharmacy requests refill on: Hydrocodone-Acetaminophen 10-325 mg   LAST REFILL: 02/09/2021 (Q-120, R-0) LAST OV: 03/11/2021 NEXT OV: 04/08/2021 PHARMACY: [id number] [place], [place]

## 2021-03-13 NOTE — Assessment & Plan Note (Signed)
Has new portable oxygen concentrator however not using. Discussed importance of regularly using supplemental oxygen.

## 2021-03-14 MED ORDER — HYDROCODONE-ACETAMINOPHEN 10-325 MG PO TABS: 1.0000 | ORAL_TABLET | Freq: Four times a day (QID) | ORAL | 0 refills | Status: DC | PRN

## 2021-03-14 NOTE — Telephone Encounter (Signed)
Duplicate request

## 2021-03-14 NOTE — Telephone Encounter (Signed)
ERx 

## 2021-03-19 ENCOUNTER — Telehealth: Payer: Self-pay | Admitting: Pulmonary Disease

## 2021-03-19 MED ORDER — BREZTRI AEROSPHERE 160-9-4.8 MCG/ACT IN AERO: 2.0000 | INHALATION_SPRAY | Freq: Two times a day (BID) | RESPIRATORY_TRACT | 0 refills | Status: DC

## 2021-03-19 NOTE — Telephone Encounter (Signed)
Called and spoke to patient, who is requesting sample of Breztri.   One sample of [name] has been placed up front for pickup. Patient is aware and voiced his understanding.  Nothing further needed at this time.

## 2021-03-19 NOTE — Telephone Encounter (Signed)
Pt called back and and stated that medication that he would like a sample of is [employer]

## 2021-03-20 ENCOUNTER — Telehealth: Payer: Self-pay

## 2021-03-20 NOTE — Progress Notes (Signed)
Note opened on Error.

## 2021-03-20 NOTE — Chronic Care Management (AMB) (Addendum)
Chronic Care Management Pharmacy Assistant   Name: [name]  MRN: [id number] DOB: Sep 22, 1946   Reason for Encounter: Disease State DM   Conditions to be addressed/monitored: DMII  Recent office visits:  03/11/2021  Dr.Gutierrez,  PCP  Recent consult visits:  03/13/2021  Dede Query, RN Cardiology  PT/INR 02/06/2021  Dr.Gonzalez  Pulmonology    Trial of Breztri 2 puffs a day,rinse mouth afterwards.       Hospital visits:  None in previous 6 months  Medications: Outpatient Encounter Medications as of 03/20/2021  Medication Sig   HYDROcodone-acetaminophen (NORCO) 10-325 MG tablet Take 1 tablet by mouth every 6 (six) hours as needed for moderate pain or severe pain.   albuterol (VENTOLIN HFA) 108 (90 Base) MCG/ACT inhaler Inhale 2 puffs into the lungs every 6 (six) hours as needed for wheezing or shortness of breath.   amiodarone (PACERONE) 200 MG tablet Take 1.5 tablets (300 mg total) by mouth daily.   Budeson-Glycopyrrol-Formoterol (BREZTRI AEROSPHERE) 160-9-4.8 MCG/ACT AERO Inhale 2 puffs into the lungs in the morning and at bedtime.   Budeson-Glycopyrrol-Formoterol (BREZTRI AEROSPHERE) 160-9-4.8 MCG/ACT AERO Inhale 2 puffs into the lungs in the morning and at bedtime.   Cholecalciferol (VITAMIN D) 50 MCG (2000 UT) CAPS Take 1 capsule (2,000 Units total) by mouth daily.   Coenzyme Q10 (COQ-10 PO) Take 1 tablet by mouth daily.   Cyanocobalamin (VITAMIN B-12) 500 MCG TABS Take 500 mcg by mouth daily.   diphenoxylate-atropine (LOMOTIL) 2.5-0.025 MG tablet Take 1 tablet by mouth 2 (two) times daily as needed.   fish oil-omega-3 fatty acids 1000 MG capsule Take 2 g by mouth daily.   furosemide (LASIX) 20 MG tablet TAKE 1 TABLET BY MOUTH EVERY DAY AS NEEDED FOR FLUID RETENTION   gabapentin (NEURONTIN) 600 MG tablet Take 0.5-1 tablets (300-600 mg total) by mouth at bedtime as needed (sleep/leg pain).   ipratropium-albuterol (DUONEB) 0.5-2.5 (3) MG/3ML SOLN Take 3 mLs by nebulization  every 6 (six) hours as needed.   metFORMIN (GLUCOPHAGE-XR) 500 MG 24 hr tablet Take 500 mg by mouth 2 (two) times daily.   methocarbamol (ROBAXIN) 750 MG tablet TAKE 1 TABLET(750 MG) BY MOUTH TWICE DAILY   midodrine (PROAMATINE) 10 MG tablet Take 1 tablet (10 mg total) by mouth 3 (three) times daily as needed.   nitroGLYCERIN (NITROLINGUAL) 0.4 MG/SPRAY spray USE 1 SPRAY AS DIRECTED EVERY 5 MINUTES AS NEEDED   omeprazole (PRILOSEC) 40 MG capsule Take 1 capsule (40 mg total) by mouth daily.   pravastatin (PRAVACHOL) 20 MG tablet TAKE 1 TABLET(20 MG) BY MOUTH DAILY   warfarin (COUMADIN) 2.5 MG tablet TAKE AS DIRECTED FOR ANTI-COAG CLINIC   No facility-administered encounter medications on file as of 03/20/2021.    Recent Relevant Labs: Lab Results  Component Value Date/Time   HGBA1C 7.6 (H) 01/15/2021 01:06 PM   HGBA1C 7.2 (A) 09/04/2020 09:54 AM   HGBA1C 8.0 (H) 04/16/2020 08:37 AM   MICROALBUR 3.1 (H) 04/16/2020 08:37 AM   MICROALBUR 1.3 04/20/2019 09:25 AM    Kidney Function Lab Results  Component Value Date/Time   CREATININE 1.18 02/01/2021 09:46 AM   CREATININE 1.21 01/15/2021 01:06 PM   CREATININE 1.34 (H) 06/09/2018 07:00 PM   CREATININE 1.16 02/27/2012 03:51 PM   GFR 59.84 (L) 02/01/2021 09:46 AM   GFRNONAA 76 09/07/2017 02:19 PM   GFRAA 88 09/07/2017 02:19 PM   Diabetes  Attempted to reach patient on 4/13, 4/25,4/28 to review medication adherence and vital signs.  Unable to reach patient.  Chart reviewed.  Current antihyperglycemic regimen: Metformin 500 mg twice daily   What recent interventions/DTPs have been made to improve glycemic control:   None documented  Have there been any recent hospitalizations or ED visits since last visit with CPP? No    On insulin? No   Adherence Review: Is the patient currently on a STATIN medication? Yes Is the patient currently on ACE/ARB medication? No Does the patient have >5 day gap between last estimated fill dates? No gaps  in adherence  Star Rating Drugs:  Medication:  Last Fill: Day Supply Pravastatin 20mg . 01/18/2021 90ds Metformin  500mg  03/07/2021 90ds   Follow-Up:  Pharmacist Review  [name], CPP notified  Avel Sensor [facility] Clinical Pharmacy Assistant [phone]  I have reviewed the care management and care coordination activities outlined in this encounter and I am certifying that I agree with the content of this note. PCP visit next month scheduled.   [name], PharmD Clinical Pharmacist [facility] at [facility] [phone]

## 2021-03-21 ENCOUNTER — Other Ambulatory Visit: Payer: Medicare Other

## 2021-03-27 ENCOUNTER — Other Ambulatory Visit: Payer: Self-pay

## 2021-03-27 ENCOUNTER — Ambulatory Visit (INDEPENDENT_AMBULATORY_CARE_PROVIDER_SITE_OTHER): Payer: Medicare Other

## 2021-03-27 DIAGNOSIS — Z5181 Encounter for therapeutic drug level monitoring: Secondary | ICD-10-CM | POA: Diagnosis not present

## 2021-03-27 DIAGNOSIS — I4891 Unspecified atrial fibrillation: Secondary | ICD-10-CM

## 2021-03-27 LAB — POCT INR: INR: 2.3 (ref 2.0–3.0)

## 2021-03-27 NOTE — Patient Instructions (Signed)
-   continue warfarin dosage of 1 tablet (2.5 mg) EVERY DAY EXCEPT 1/2 tablet  (1.25 mg) on [place]. -Recheck INR in 3 weeks.

## 2021-03-28 ENCOUNTER — Ambulatory Visit: Payer: Medicare Other | Admitting: Radiation Oncology

## 2021-04-01 ENCOUNTER — Other Ambulatory Visit: Payer: Self-pay | Admitting: Family Medicine

## 2021-04-01 DIAGNOSIS — E538 Deficiency of other specified B group vitamins: Secondary | ICD-10-CM

## 2021-04-01 DIAGNOSIS — I10 Essential (primary) hypertension: Secondary | ICD-10-CM

## 2021-04-01 DIAGNOSIS — I4891 Unspecified atrial fibrillation: Secondary | ICD-10-CM

## 2021-04-01 DIAGNOSIS — E1140 Type 2 diabetes mellitus with diabetic neuropathy, unspecified: Secondary | ICD-10-CM

## 2021-04-01 DIAGNOSIS — C61 Malignant neoplasm of prostate: Secondary | ICD-10-CM

## 2021-04-01 DIAGNOSIS — IMO0002 Reserved for concepts with insufficient information to code with codable children: Secondary | ICD-10-CM

## 2021-04-01 DIAGNOSIS — K8689 Other specified diseases of pancreas: Secondary | ICD-10-CM

## 2021-04-01 DIAGNOSIS — E559 Vitamin D deficiency, unspecified: Secondary | ICD-10-CM

## 2021-04-01 DIAGNOSIS — D509 Iron deficiency anemia, unspecified: Secondary | ICD-10-CM

## 2021-04-01 DIAGNOSIS — E782 Mixed hyperlipidemia: Secondary | ICD-10-CM

## 2021-04-04 ENCOUNTER — Other Ambulatory Visit (INDEPENDENT_AMBULATORY_CARE_PROVIDER_SITE_OTHER): Payer: Medicare Other

## 2021-04-04 ENCOUNTER — Other Ambulatory Visit: Payer: Self-pay

## 2021-04-04 DIAGNOSIS — IMO0002 Reserved for concepts with insufficient information to code with codable children: Secondary | ICD-10-CM

## 2021-04-04 DIAGNOSIS — E782 Mixed hyperlipidemia: Secondary | ICD-10-CM | POA: Diagnosis not present

## 2021-04-04 DIAGNOSIS — E538 Deficiency of other specified B group vitamins: Secondary | ICD-10-CM

## 2021-04-04 DIAGNOSIS — E1140 Type 2 diabetes mellitus with diabetic neuropathy, unspecified: Secondary | ICD-10-CM | POA: Diagnosis not present

## 2021-04-04 DIAGNOSIS — E559 Vitamin D deficiency, unspecified: Secondary | ICD-10-CM | POA: Diagnosis not present

## 2021-04-04 DIAGNOSIS — I10 Essential (primary) hypertension: Secondary | ICD-10-CM

## 2021-04-04 DIAGNOSIS — D509 Iron deficiency anemia, unspecified: Secondary | ICD-10-CM | POA: Diagnosis not present

## 2021-04-04 DIAGNOSIS — E1165 Type 2 diabetes mellitus with hyperglycemia: Secondary | ICD-10-CM

## 2021-04-04 LAB — COMPREHENSIVE METABOLIC PANEL
ALT: 14 U/L (ref 0–53)
AST: 16 U/L (ref 0–37)
Albumin: 3.5 g/dL (ref 3.5–5.2)
Alkaline Phosphatase: 80 U/L (ref 39–117)
BUN: 12 mg/dL (ref 6–23)
CO2: 29 mEq/L (ref 19–32)
Calcium: 8.4 mg/dL (ref 8.4–10.5)
Chloride: 104 mEq/L (ref 96–112)
Creatinine, Ser: 0.97 mg/dL (ref 0.40–1.50)
GFR: 75.61 mL/min (ref >60.00)
Glucose, Bld: 192 mg/dL — ABNORMAL HIGH (ref 70–99)
Potassium: 4.5 mEq/L (ref 3.5–5.1)
Sodium: 140 mEq/L (ref 135–145)
Total Bilirubin: 0.5 mg/dL (ref 0.2–1.2)
Total Protein: 6 g/dL (ref 6.0–8.3)

## 2021-04-04 LAB — CBC WITH DIFFERENTIAL/PLATELET
Basophils Absolute: 0 10*3/uL (ref 0.0–0.1)
Basophils Relative: 0.6 % (ref 0.0–3.0)
Eosinophils Absolute: 0.3 10*3/uL (ref 0.0–0.7)
Eosinophils Relative: 4.4 % (ref 0.0–5.0)
HCT: 38.4 % — ABNORMAL LOW (ref 39.0–52.0)
Hemoglobin: 12.8 g/dL — ABNORMAL LOW (ref 13.0–17.0)
Lymphocytes Relative: 14.2 % (ref 12.0–46.0)
Lymphs Abs: 1.1 10*3/uL (ref 0.7–4.0)
MCHC: 33.4 g/dL (ref 30.0–36.0)
MCV: 92.3 fl (ref 78.0–100.0)
Monocytes Absolute: 0.5 10*3/uL (ref 0.1–1.0)
Monocytes Relative: 6.6 % (ref 3.0–12.0)
Neutro Abs: 5.6 10*3/uL (ref 1.4–7.7)
Neutrophils Relative %: 74.2 % (ref 43.0–77.0)
Platelets: 217 10*3/uL (ref 150.0–400.0)
RBC: 4.16 Mil/uL — ABNORMAL LOW (ref 4.22–5.81)
RDW: 15.6 % — ABNORMAL HIGH (ref 11.5–15.5)
WBC: 7.6 10*3/uL (ref 4.0–10.5)

## 2021-04-04 LAB — LIPID PANEL
Cholesterol: 130 mg/dL (ref 0–200)
HDL: 37.2 mg/dL — ABNORMAL LOW (ref >39.00)
LDL Cholesterol: 72 mg/dL (ref 0–99)
NonHDL: 92.3
Total CHOL/HDL Ratio: 3
Triglycerides: 104 mg/dL (ref 0.0–149.0)
VLDL: 20.8 mg/dL (ref 0.0–40.0)

## 2021-04-04 LAB — MICROALBUMIN / CREATININE URINE RATIO
Creatinine,U: 106.6 mg/dL
Microalb Creat Ratio: 3.1 mg/g (ref 0.0–30.0)
Microalb, Ur: 3.3 mg/dL — ABNORMAL HIGH (ref 0.0–1.9)

## 2021-04-04 LAB — IBC PANEL
Iron: 38 ug/dL — ABNORMAL LOW (ref 42–165)
Saturation Ratios: 12 % — ABNORMAL LOW (ref 20.0–50.0)
Transferrin: 227 mg/dL (ref 212.0–360.0)

## 2021-04-04 LAB — VITAMIN D 25 HYDROXY (VIT D DEFICIENCY, FRACTURES): VITD: 31.53 ng/mL (ref 30.00–100.00)

## 2021-04-04 LAB — FERRITIN: Ferritin: 27.9 ng/mL (ref 22.0–322.0)

## 2021-04-04 LAB — TSH: TSH: 2.24 u[IU]/mL (ref 0.35–4.50)

## 2021-04-04 LAB — HEMOGLOBIN A1C: Hgb A1c MFr Bld: 6.9 % — ABNORMAL HIGH (ref 4.6–6.5)

## 2021-04-04 LAB — VITAMIN B12: Vitamin B-12: >1506 pg/mL — ABNORMAL HIGH (ref 211–911)

## 2021-04-05 NOTE — Progress Notes (Addendum)
The wife Jannet Askew returned a call and stated the following BG's  04/01/2021  10:00pm   140  04/02/2021    7:30am   [place], CPP notified  Avel Sensor, [place] Assistant [phone]  Scheduled CCM visit for July - Patient has PCP visit in June. BG is adequate control.   [name], PharmD Clinical Pharmacist [facility] at [facility] [phone]

## 2021-04-08 ENCOUNTER — Encounter: Payer: Medicare Other | Admitting: Family Medicine

## 2021-04-09 ENCOUNTER — Other Ambulatory Visit: Payer: Self-pay | Admitting: Family Medicine

## 2021-04-09 ENCOUNTER — Other Ambulatory Visit: Payer: Self-pay | Admitting: Cardiovascular Disease

## 2021-04-09 ENCOUNTER — Ambulatory Visit: Payer: Medicare Other | Admitting: Family Medicine

## 2021-04-09 DIAGNOSIS — G894 Chronic pain syndrome: Secondary | ICD-10-CM

## 2021-04-09 NOTE — Telephone Encounter (Signed)
Name of Medication: Hydrocodone Name of Pharmacy: [facility] Last Fill or Written Date and Quantity: 03/14/21 #120 Last Office Visit and Type: 03/11/21 Next Office Visit and Type: 06/04/21 Last Controlled Substance Agreement Date: 02/03/18 Last UDS:07/06/17

## 2021-04-09 NOTE — Telephone Encounter (Signed)
Rx request sent to pharmacy.  

## 2021-04-10 MED ORDER — HYDROCODONE-ACETAMINOPHEN 10-325 MG PO TABS: 1.0000 | ORAL_TABLET | Freq: Four times a day (QID) | ORAL | 0 refills | Status: DC | PRN

## 2021-04-10 NOTE — Telephone Encounter (Signed)
ERx 

## 2021-04-17 ENCOUNTER — Ambulatory Visit (INDEPENDENT_AMBULATORY_CARE_PROVIDER_SITE_OTHER): Payer: Medicare Other

## 2021-04-17 ENCOUNTER — Other Ambulatory Visit: Payer: Self-pay

## 2021-04-17 DIAGNOSIS — I4891 Unspecified atrial fibrillation: Secondary | ICD-10-CM

## 2021-04-17 DIAGNOSIS — Z5181 Encounter for therapeutic drug level monitoring: Secondary | ICD-10-CM

## 2021-04-17 LAB — POCT INR: INR: 1.6 — AB (ref 2.0–3.0)

## 2021-04-17 NOTE — Patient Instructions (Signed)
-   take extra 1/2 tablet tonight, then  - continue warfarin dosage of 1 tablet (2.5 mg) EVERY DAY EXCEPT 1/2 tablet  (1.25 mg) on [place]. -Recheck INR in 2 weeks.

## 2021-04-18 ENCOUNTER — Telehealth: Payer: Self-pay

## 2021-04-18 NOTE — Telephone Encounter (Signed)
Wife [name] called with concern of big toe on left foot seems to be warm to touch--redness... some discharge white and blood tinged mucus... x 1 week... she wants pt to see you, there are no openings with you until Wed next week and no openings in office with anyone else until Monday  Wife says she will send picture this evening when she gets home for you to see

## 2021-04-18 NOTE — Telephone Encounter (Signed)
Noted  

## 2021-04-18 NOTE — Telephone Encounter (Signed)
May schedule tomorrow at 12:30pm thanks.

## 2021-04-18 NOTE — Telephone Encounter (Signed)
Patient is scheduled. Thanks. EM

## 2021-04-19 ENCOUNTER — Other Ambulatory Visit: Payer: Self-pay

## 2021-04-19 ENCOUNTER — Encounter: Payer: Self-pay | Admitting: Family Medicine

## 2021-04-19 ENCOUNTER — Ambulatory Visit (INDEPENDENT_AMBULATORY_CARE_PROVIDER_SITE_OTHER): Payer: Medicare Other | Admitting: Family Medicine

## 2021-04-19 VITALS — BP 124/68 | HR 53 | Temp 97.3°F | Ht 72.0 in | Wt 215.5 lb

## 2021-04-19 DIAGNOSIS — E11621 Type 2 diabetes mellitus with foot ulcer: Secondary | ICD-10-CM

## 2021-04-19 DIAGNOSIS — I25118 Atherosclerotic heart disease of native coronary artery with other forms of angina pectoris: Secondary | ICD-10-CM

## 2021-04-19 DIAGNOSIS — E1140 Type 2 diabetes mellitus with diabetic neuropathy, unspecified: Secondary | ICD-10-CM | POA: Diagnosis not present

## 2021-04-19 DIAGNOSIS — Z8631 Personal history of diabetic foot ulcer: Secondary | ICD-10-CM | POA: Insufficient documentation

## 2021-04-19 DIAGNOSIS — R2689 Other abnormalities of gait and mobility: Secondary | ICD-10-CM

## 2021-04-19 DIAGNOSIS — W19XXXD Unspecified fall, subsequent encounter: Secondary | ICD-10-CM | POA: Diagnosis not present

## 2021-04-19 DIAGNOSIS — L97522 Non-pressure chronic ulcer of other part of left foot with fat layer exposed: Secondary | ICD-10-CM

## 2021-04-19 HISTORY — DX: Personal history of diabetic foot ulcer: Z86.31

## 2021-04-19 MED ORDER — AMOXICILLIN-POT CLAVULANATE 875-125 MG PO TABS: 1.0000 | ORAL_TABLET | Freq: Two times a day (BID) | ORAL | 0 refills | Status: DC

## 2021-04-19 NOTE — Progress Notes (Signed)
Patient ID: [name], male    DOB: September 27, 1946, 75 y.o.   MRN: [id number]  This visit was conducted in person.  BP 124/68   Pulse (!) 53   Temp (!) 97.3 F (36.3 C) (Temporal)   Ht 6' (1.829 m)   Wt 215 lb 8 oz (97.8 kg)   SpO2 95%   BMI 29.23 kg/m    CC: check foot/toe wound  Subjective:   HPI: [name] is a 75 y.o. male presenting on 04/19/2021 for Blister (C/o blister on left great toe.  Noticed about 4 days ago. Area has drained. )   4d h/o large blister to R medial great toe. He punctured blister and drained red and clear fluid.  Denies fevers/chills, nausea, streaking redness.   He is worried as this is side of previous LLE skin burn.  Upcoming appt next Wednesday with wound clinic   Known diabetic on metformin 500mg  bid Lab Results  Component Value Date   HGBA1C 6.9 (H) 04/04/2021  Ongoing falls - loses balance worse on uneven flooring.        Relevant past medical, surgical, family and social history reviewed and updated as indicated. Interim medical history since our last visit reviewed. Allergies and medications reviewed and updated. Outpatient Medications Prior to Visit  Medication Sig Dispense Refill  . albuterol (VENTOLIN HFA) 108 (90 Base) MCG/ACT inhaler Inhale 2 puffs into the lungs every 6 (six) hours as needed for wheezing or shortness of breath. 18 g 11  . amiodarone (PACERONE) 200 MG tablet TAKE 1 AND 1/2 TABLETS(300 MG) BY MOUTH DAILY 135 tablet 3  . Budeson-Glycopyrrol-Formoterol (BREZTRI AEROSPHERE) 160-9-4.8 MCG/ACT AERO Inhale 2 puffs into the lungs in the morning and at bedtime. 5.9 g 0  . Budeson-Glycopyrrol-Formoterol (BREZTRI AEROSPHERE) 160-9-4.8 MCG/ACT AERO Inhale 2 puffs into the lungs in the morning and at bedtime. 5.9 g 0  . Cholecalciferol (VITAMIN D) 50 MCG (2000 UT) CAPS Take 1 capsule (2,000 Units total) by mouth daily. 30 capsule   . Coenzyme Q10 (COQ-10 PO) Take 1 tablet by mouth daily.    . Cyanocobalamin (VITAMIN B-12)  500 MCG TABS Take 500 mcg by mouth daily.    . diphenoxylate-atropine (LOMOTIL) 2.5-0.025 MG tablet Take 1 tablet by mouth 2 (two) times daily as needed. 20 tablet 1  . fish oil-omega-3 fatty acids 1000 MG capsule Take 2 g by mouth daily.    . furosemide (LASIX) 20 MG tablet TAKE 1 TABLET BY MOUTH EVERY DAY AS NEEDED FOR FLUID RETENTION 90 tablet 0  . gabapentin (NEURONTIN) 600 MG tablet Take 0.5-1 tablets (300-600 mg total) by mouth at bedtime as needed (sleep/leg pain). 30 tablet 3  . HYDROcodone-acetaminophen (NORCO) 10-325 MG tablet Take 1 tablet by mouth every 6 (six) hours as needed for moderate pain or severe pain. 120 tablet 0  . ipratropium-albuterol (DUONEB) 0.5-2.5 (3) MG/3ML SOLN Take 3 mLs by nebulization every 6 (six) hours as needed. 360 mL 11  . metFORMIN (GLUCOPHAGE-XR) 500 MG 24 hr tablet Take 500 mg by mouth 2 (two) times daily.    . methocarbamol (ROBAXIN) 750 MG tablet TAKE 1 TABLET(750 MG) BY MOUTH TWICE DAILY 180 tablet 0  . midodrine (PROAMATINE) 10 MG tablet Take 1 tablet (10 mg total) by mouth 3 (three) times daily as needed. 90 tablet 1  . nitroGLYCERIN (NITROLINGUAL) 0.4 MG/SPRAY spray USE 1 SPRAY AS DIRECTED EVERY 5 MINUTES AS NEEDED 4.9 g 1  . omeprazole (PRILOSEC) 40 MG  capsule Take 1 capsule (40 mg total) by mouth daily. 90 capsule 3  . pravastatin (PRAVACHOL) 20 MG tablet TAKE 1 TABLET(20 MG) BY MOUTH DAILY 90 tablet 3  . warfarin (COUMADIN) 2.5 MG tablet TAKE AS DIRECTED FOR ANTI-COAG CLINIC 45 tablet 1   No facility-administered medications prior to visit.     Per HPI unless specifically indicated in ROS section below Review of Systems Objective:  BP 124/68   Pulse (!) 53   Temp (!) 97.3 F (36.3 C) (Temporal)   Ht 6' (1.829 m)   Wt 215 lb 8 oz (97.8 kg)   SpO2 95%   BMI 29.23 kg/m   Wt Readings from Last 3 Encounters:  04/19/21 215 lb 8 oz (97.8 kg)  03/11/21 218 lb 6 oz (99.1 kg)  02/08/21 225 lb (102.1 kg)      Physical Exam Vitals and  nursing note reviewed.  Constitutional:      Appearance: Normal appearance.  Musculoskeletal:        General: No swelling.     Right lower leg: No edema.     Left lower leg: No edema.  Skin:    General: Skin is warm and dry.     Findings: Abrasion, bruising and laceration present.     Comments: Large open blister to L great toe encompassing medial toe, skin remains attached and covering majority of blister except for area of opening near nail bed. Mild surrounding erythema without drainage.  Superficial laceration proximal to blister at L foot medial to MTP joint   Neurological:     Mental Status: He is alert.  Psychiatric:        Mood and Affect: Mood normal.        Behavior: Behavior normal.       Results for orders placed or performed in visit on 04/17/21  POCT INR  Result Value Ref Range   INR 1.6 (A) 2.0 - 3.0   *Note: Due to a large number of results and/or encounters for the requested time period, some results have not been displayed. A complete set of results can be found in Results Review.   Assessment & Plan:  This visit occurred during the SARS-CoV-2 public health emergency.  Safety protocols were in place, including screening questions prior to the visit, additional usage of staff PPE, and extensive cleaning of exam room while observing appropriate contact time as indicated for disinfecting solutions.   Problem List Items Addressed This Visit    Type 2 diabetes, controlled, with neuropathy ([place])   Fall with injury   Imbalance    Referred to neurology 05/2020, pt cancelled and did not reschedule.      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 mellitus, with fat layer exposed ([place]) - Primary    Skin around blister cleaned with alcohol pad, blister and nearby superficial laceration cleaned with normal saline and dressed with antibiotic ointment and nonadherent gauze. Home wound care instructions provided. Rx empiric augmentin course.  Keep  upcoming wound clinic appt next week.       Relevant Orders   WOUND CULTURE       Meds ordered this encounter  Medications  . amoxicillin-clavulanate (AUGMENTIN) 875-125 MG tablet    Sig: Take 1 tablet by mouth 2 (two) times daily for 7 days.    Dispense:  14 tablet    Refill:  0   Orders Placed This Encounter  Procedures  . WOUND CULTURE  Order Specific Question:   Source    Answer:   L great toe blister    Patient Instructions  No more alcohol or peroxide. When you do dressing changes, wash with sterile water or normal saline and dress with triple antibiotic ointment.  Start antibiotic augmentin twice daily for 1 week. Keep wound clinic follow up appointment.   Follow up plan: No follow-ups on file.  Ria Bush, MD

## 2021-04-19 NOTE — Assessment & Plan Note (Signed)
Skin around blister cleaned with alcohol pad, blister and nearby superficial laceration cleaned with normal saline and dressed with antibiotic ointment and nonadherent gauze. Home wound care instructions provided. Rx empiric augmentin course.  Keep upcoming wound clinic appt next week.

## 2021-04-19 NOTE — Assessment & Plan Note (Signed)
Referred to neurology 05/2020, pt cancelled and did not reschedule.

## 2021-04-19 NOTE — Patient Instructions (Addendum)
No more alcohol or peroxide. When you do dressing changes, wash with sterile water or normal saline and dress with triple antibiotic ointment.  Start antibiotic augmentin twice daily for 1 week. Keep wound clinic follow up appointment.

## 2021-04-22 ENCOUNTER — Telehealth: Payer: Self-pay

## 2021-04-22 ENCOUNTER — Other Ambulatory Visit: Payer: Self-pay | Admitting: Family Medicine

## 2021-04-22 LAB — WOUND CULTURE
MICRO NUMBER:: 11888193
SPECIMEN QUALITY:: ADEQUATE

## 2021-04-22 MED ORDER — SULFAMETHOXAZOLE-TRIMETHOPRIM 800-160 MG PO TABS: 1.0000 | ORAL_TABLET | Freq: Two times a day (BID) | ORAL | 0 refills | Status: DC

## 2021-04-22 NOTE — Telephone Encounter (Signed)
Attempted to contact pt to schedule INR check this week since he is on bactrim. Left message for him to call [facility] office since I am here today.

## 2021-04-22 NOTE — Telephone Encounter (Signed)
-----   Message from Ria Bush, MD sent at 04/22/2021 10:30 AM EDT ----- [name] - plz notify wound culture of great toe returned growing MRSA bacteria resistant to antibiotic chosen - recommend he stop augmentin and start bactrim DS twice daily for 5 days. Keep wound clinic appt for Wednesday.  Will also send note to coumadin clinic that we're starting short bactrim course.

## 2021-04-23 ENCOUNTER — Ambulatory Visit (INDEPENDENT_AMBULATORY_CARE_PROVIDER_SITE_OTHER): Payer: Medicare Other | Admitting: Cardiovascular Disease

## 2021-04-23 ENCOUNTER — Encounter: Payer: Self-pay | Admitting: Cardiovascular Disease

## 2021-04-23 ENCOUNTER — Other Ambulatory Visit: Payer: Self-pay

## 2021-04-23 VITALS — BP 124/60 | HR 48 | Ht 73.0 in | Wt 217.5 lb

## 2021-04-23 DIAGNOSIS — I25118 Atherosclerotic heart disease of native coronary artery with other forms of angina pectoris: Secondary | ICD-10-CM | POA: Diagnosis not present

## 2021-04-23 DIAGNOSIS — I77810 Thoracic aortic ectasia: Secondary | ICD-10-CM

## 2021-04-23 DIAGNOSIS — I2721 Secondary pulmonary arterial hypertension: Secondary | ICD-10-CM

## 2021-04-23 DIAGNOSIS — I10 Essential (primary) hypertension: Secondary | ICD-10-CM | POA: Diagnosis not present

## 2021-04-23 DIAGNOSIS — E782 Mixed hyperlipidemia: Secondary | ICD-10-CM | POA: Diagnosis not present

## 2021-04-23 DIAGNOSIS — J432 Centrilobular emphysema: Secondary | ICD-10-CM

## 2021-04-23 DIAGNOSIS — I4891 Unspecified atrial fibrillation: Secondary | ICD-10-CM | POA: Diagnosis not present

## 2021-04-23 DIAGNOSIS — R55 Syncope and collapse: Secondary | ICD-10-CM

## 2021-04-23 MED ORDER — ALBUTEROL SULFATE HFA 108 (90 BASE) MCG/ACT IN AERS: 2.0000 | INHALATION_SPRAY | Freq: Four times a day (QID) | RESPIRATORY_TRACT | 6 refills | Status: DC | PRN
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
## 2021-04-23 NOTE — Patient Instructions (Signed)
Medication Instructions:  No changes  If you need a refill on your cardiac medications before your next appointment, please call your pharmacy.    Lab work: No new labs needed   If you have labs (blood work) drawn today and your tests are completely normal, you will receive your results only by: . MyChart Message (if you have MyChart) OR . A paper copy in the mail If you have any lab test that is abnormal or we need to change your treatment, we will call you to review the results.   Testing/Procedures: No new testing needed   Follow-Up: At [facility], you and your health needs are our priority.  As part of our continuing mission to provide you with exceptional heart care, we have created designated Provider Care Teams.  These Care Teams include your primary Cardiologist (physician) and Advanced Practice Providers (APPs -  Physician Assistants and Nurse Practitioners) who all work together to provide you with the care you need, when you need it.  . You will need a follow up appointment in 6 months  . Providers on your designated Care Team:   . Christopher Berge, NP . Ryan Dunn, PA-C . Jacquelyn Visser, PA-C  Any Other Special Instructions Will Be Listed Below (If Applicable).  COVID-19 Vaccine Information can be found at: [email or web address] For questions related to vaccine distribution or appointments, please email [email or web address] or call [phone].     

## 2021-04-24 ENCOUNTER — Other Ambulatory Visit: Payer: Self-pay | Admitting: Cardiovascular Disease

## 2021-04-24 ENCOUNTER — Encounter: Payer: Medicare Other | Attending: Internal Medicine | Admitting: Internal Medicine

## 2021-04-24 ENCOUNTER — Ambulatory Visit (INDEPENDENT_AMBULATORY_CARE_PROVIDER_SITE_OTHER): Payer: Medicare Other

## 2021-04-24 DIAGNOSIS — Z5181 Encounter for therapeutic drug level monitoring: Secondary | ICD-10-CM

## 2021-04-24 DIAGNOSIS — L97521 Non-pressure chronic ulcer of other part of left foot limited to breakdown of skin: Secondary | ICD-10-CM | POA: Insufficient documentation

## 2021-04-24 DIAGNOSIS — E11621 Type 2 diabetes mellitus with foot ulcer: Secondary | ICD-10-CM | POA: Insufficient documentation

## 2021-04-24 DIAGNOSIS — I480 Paroxysmal atrial fibrillation: Secondary | ICD-10-CM | POA: Diagnosis not present

## 2021-04-24 DIAGNOSIS — I4891 Unspecified atrial fibrillation: Secondary | ICD-10-CM

## 2021-04-24 DIAGNOSIS — I10 Essential (primary) hypertension: Secondary | ICD-10-CM | POA: Diagnosis not present

## 2021-04-24 DIAGNOSIS — Z7901 Long term (current) use of anticoagulants: Secondary | ICD-10-CM | POA: Insufficient documentation

## 2021-04-24 DIAGNOSIS — E1151 Type 2 diabetes mellitus with diabetic peripheral angiopathy without gangrene: Secondary | ICD-10-CM | POA: Diagnosis not present

## 2021-04-24 DIAGNOSIS — E1142 Type 2 diabetes mellitus with diabetic polyneuropathy: Secondary | ICD-10-CM | POA: Diagnosis not present

## 2021-04-24 DIAGNOSIS — L97512 Non-pressure chronic ulcer of other part of right foot with fat layer exposed: Secondary | ICD-10-CM | POA: Diagnosis not present

## 2021-04-24 LAB — POCT INR: INR: 2.5 (ref 2.0–3.0)

## 2021-04-24 MED ORDER — WARFARIN SODIUM 2.5 MG PO TABS: ORAL_TABLET | ORAL | 1 refills | Status: DC

## 2021-04-24 NOTE — Patient Instructions (Signed)
-   continue warfarin dosage of 1 tablet (2.5 mg) EVERY DAY EXCEPT 1/2 tablet  (1.25 mg) on [place]. -Recheck INR in 2 weeks. - since you are on Bactrim, make sure you eat some greens at least once a week

## 2021-04-29 NOTE — Progress Notes (Signed)
[name], [name] ([id number]) Visit Report for 04/24/2021 Allergy List Details Patient Name: [name], [name] Date of Service: 04/24/2021 8:30 AM Medical Record Number: [id number] Patient Account Number: [id number] Date of Birth/Sex: 28-Jun-1946 (74 y.o. M) Treating RN: Carlene Coria Primary Care Haylen Bellotti: Ria Bush Other Clinician: Referring Analiese Krupka: Referral, Self Treating Miyuki Rzasa/Extender: Ricard Dillon Weeks in Treatment: 0 Allergies Active Allergies No Known Drug Allergies Allergy Notes Electronic Signature(s) Signed: 04/29/2021 2:18:56 PM By: Carlene Coria RN Entered By: Carlene Coria on 04/24/2021 08:55:34 [name] ([id number]) -------------------------------------------------------------------------------- Arrival Information Details Patient Name: [name] Date of Service: 04/24/2021 8:30 AM Medical Record Number: [id number] Patient Account Number: [id number] Date of Birth/Sex: [birth year] (74 y.o. M) Treating RN: Carlene Coria Primary Care Kenzel Ruesch: Ria Bush Other Clinician: Referring Kierstynn Babich: Referral, Self Treating Mikhael Hendriks/Extender: Tito Dine in Treatment: 0 Visit Information Patient Arrived: Ambulatory Arrival Time: 08:51 Accompanied By: spouse Transfer Assistance: None Patient Identification Verified: Yes Secondary Verification Process Completed: Yes Patient Requires Transmission-Based No Precautions: Patient Has Alerts: Yes Patient Alerts: Patient on Blood Thinner DIABETIC History Since Last Visit All ordered tests and consults were completed: No Added or deleted any medications: No Any new allergies or adverse reactions: No Had a fall or experienced change in activities of daily living that may affect risk of falls: No Signs or symptoms of abuse/neglect since last visito No Hospitalized since last visit: No Implantable device outside of the clinic excluding cellular tissue based products placed in the center since  last visit: No Electronic Signature(s) Signed: 04/29/2021 2:18:56 PM By: Carlene Coria RN Entered By: Carlene Coria on 04/24/2021 09:17:08 [name] ([id number]) -------------------------------------------------------------------------------- Clinic Level of Care Assessment Details Patient Name: [name] Date of Service: 04/24/2021 8:30 AM Medical Record Number: [id number] Patient Account Number: [id number] Date of Birth/Sex: 1946-03-15 (74 y.o. M) Treating RN: Dolan Amen Primary Care Tykeshia Tourangeau: Ria Bush Other Clinician: Referring Cylis Ayars: Referral, Self Treating Baby Stairs/Extender: Tito Dine in Treatment: 0 Clinic Level of Care Assessment Items TOOL 2 Quantity Score X - Use when only an EandM is performed on the INITIAL visit 1 0 ASSESSMENTS - Nursing Assessment / Reassessment X - General Physical Exam (combine w/ comprehensive assessment (listed just below) when performed on new 1 20 pt. evals) X- 1 25 Comprehensive Assessment (HX, ROS, Risk Assessments, Wounds Hx, etc.) ASSESSMENTS - Wound and Skin Assessment / Reassessment X - Simple Wound Assessment / Reassessment - one wound 1 5 _0  - 0 Complex Wound Assessment / Reassessment - multiple wounds _1  - 0 Dermatologic / Skin Assessment (not related to wound area) ASSESSMENTS - Ostomy and/or Continence Assessment and Care _2  - Incontinence Assessment and Management 0 _3  - 0 Ostomy Care Assessment and Management (repouching, etc.) PROCESS - Coordination of Care X - Simple Patient / Family Education for ongoing care 1 15 _4  - 0 Complex (extensive) Patient / Family Education for ongoing care _5  - 0 Staff obtains Programmer, systems, Records, Test Results / Process Orders _6  - 0 Staff telephones HHA, Nursing Homes / Clarify orders / etc _7  - 0 Routine Transfer to another Facility (non-emergent condition) _8  - 0 Routine Hospital Admission (non-emergent condition) _9  - 0 New Admissions / Medical laboratory scientific officer / Ordering NPWT, Apligraf, etc. _10  - 0 Emergency Hospital Admission (emergent condition) X- 1 10 Simple Discharge Coordination _11  - 0 Complex (extensive) Discharge Coordination PROCESS - Special Needs _12  - Pediatric / Minor Patient Management 0 _13  - 0 Isolation Patient Management _14  - 0 Hearing /  Language / Visual special needs _0  - 0 Assessment of Community assistance (transportation, D/C planning, etc.) _1  - 0 Additional assistance / Altered mentation _2  - 0 Support Surface(s) Assessment (bed, cushion, seat, etc.) INTERVENTIONS - Wound Cleansing / Measurement X - Wound Imaging (photographs - any number of wounds) 1 5 _3  - 0 Wound Tracing (instead of photographs) X- 1 5 Simple Wound Measurement - one wound _4  - 0 Complex Wound Measurement - multiple wounds [name], [name]. ([id number]) X- 1 5 Simple Wound Cleansing - one wound _5  - 0 Complex Wound Cleansing - multiple wounds INTERVENTIONS - Wound Dressings _6  - Small Wound Dressing one or multiple wounds 0 X- 1 15 Medium Wound Dressing one or multiple wounds _7  - 0 Large Wound Dressing one or multiple wounds <[id number]>_0<\/[id number]>_2  - 0 Application of Medications - injection INTERVENTIONS - Miscellaneous _9  - External ear exam 0 _10  - 0 Specimen Collection (cultures, biopsies, blood, body fluids, etc.) _11  - 0 Specimen(s) / Culture(s) sent or taken to Lab for analysis _12  - 0 Patient Transfer (multiple staff / Civil Service fast streamer / Similar devices) _13  - 0 Simple Staple / Suture removal (25 or less) _14  - 0 Complex Staple / Suture removal (26 or more) _15  - 0 Hypo / Hyperglycemic Management (close monitor of Blood Glucose) X- 1 15 Ankle / Brachial Index (ABI) - do not check if billed separately Has the patient been seen at the hospital within the last three years: Yes Total Score: 120 Level Of Care: New/Established - Level 4 Electronic Signature(s) Signed: 04/24/2021 12:48:14 PM By: Georges Mouse, Minus Breeding RN Entered By: Georges Mouse, Minus Breeding on 04/24/2021 09:37:05 [name] ([id number]) -------------------------------------------------------------------------------- Encounter Discharge Information Details Patient Name: [name] Date of Service: 04/24/2021 8:30 AM Medical Record Number: [id number] Patient Account Number: [id number] Date of Birth/Sex: Apr 29, 1946 (74 y.o. M) Treating RN: Cornell Barman Primary Care Jazelyn Sipe: Ria Bush Other Clinician: Referring [name] [name]: Referral, Self Treating Kashlynn Kundert/Extender: Tito Dine in Treatment: 0 Encounter Discharge Information Items Post Procedure Vitals Discharge Condition: Stable Temperature (F): 97.7 Ambulatory Status: Cane Pulse (bpm): 58 Discharge Destination: Home Respiratory Rate (breaths/min): 18 Transportation: Private Auto Blood Pressure (mmHg): 141/66 Accompanied By: spouse Schedule Follow-up Appointment: Yes Clinical Summary of Care: Electronic Signature(s) Signed: 04/25/2021 5:05:23 PM By: Jeanine Luz Entered By: Jeanine Luz on 04/24/2021 09:52:21 [name] ([id number]) -------------------------------------------------------------------------------- Lower Extremity Assessment Details Patient Name: [name] Date of Service: 04/24/2021 8:30 AM Medical Record Number: [id number] Patient Account Number: [id number] Date of Birth/Sex: Feb 20, 1946 (74 y.o. M) Treating RN: Carlene Coria Primary Care Norine Reddington: Ria Bush Other Clinician: Referring Ylonda Storr: Referral, Self Treating Royston Bekele/Extender: Tito Dine in Treatment: 0 Edema Assessment Assessed: [Left: No] [Right: No] Edema: [Left: Ye] [Right: s] Calf Left: Right: Point of Measurement: 39 cm From Medial Instep 33 cm Ankle Left: Right: Point of Measurement: 10 cm From Medial Instep 22 cm Knee To Floor Left: Right: From Medial Instep 47 cm Vascular Assessment Pulses: Dorsalis Pedis Palpable: [Left:Yes] Blood  Pressure: Brachial: [Left:141] Ankle: [Left:Dorsalis Pedis: 160 1.13] Electronic Signature(s) Signed: 04/29/2021 2:18:56 PM By: Carlene Coria RN Entered By: Carlene Coria on 04/24/2021 09:16:46 [name] ([id number]) -------------------------------------------------------------------------------- Multi Wound Chart Details Patient Name: [name] Date of Service: 04/24/2021 8:30 AM Medical Record Number: [id number] Patient Account Number: [id number] Date of Birth/Sex: Oct 14, 1946 (74 y.o. M) Treating RN: Dolan Amen Primary Care Bryker Fletchall: Ria Bush Other Clinician: Referring Chrissi Crow: Referral, Self Treating Mickey Hebel/Extender: Ricard Dillon Weeks in Treatment: 0  Vital Signs Height(in): 72 Pulse(bpm): 58 Weight(lbs): 217 Blood Pressure(mmHg): 141/66 Body Mass Index(BMI): 29 Temperature(F): 97.7 Respiratory Rate(breaths/min): 18 Photos: [N/A:N/A] Wound Location: Right Toe Great N/A N/A Wounding Event: Gradually Appeared N/A N/A Primary Etiology: Diabetic Wound/Ulcer of the Lower N/A N/A Extremity Comorbid History: Sleep Apnea, Arrhythmia, N/A N/A Hypertension, Type II Diabetes, Osteoarthritis, Neuropathy, Received Radiation Date Acquired: 04/07/2021 N/A N/A Weeks of Treatment: 0 N/A N/A Wound Status: Open N/A N/A Measurements L x W x D (cm) 2.5x4x0.2 N/A N/A Area (cm) : 7.854 N/A N/A Volume (cm) : 1.571 N/A N/A Classification: Grade 2 N/A N/A Exudate Amount: Medium N/A N/A Exudate Type: Serosanguineous N/A N/A Exudate Color: red, brown N/A N/A Granulation Amount: Small (1-33%) N/A N/A Granulation Quality: Pink N/A N/A Necrotic Amount: Large (67-100%) N/A N/A Exposed Structures: Fat Layer (Subcutaneous Tissue): N/A N/A Yes Fascia: No Tendon: No Muscle: No Joint: No Bone: No Epithelialization: None N/A N/A Debridement: Chemical/Enzymatic/Mechanical N/A N/A Pre-procedure Verification/Time 09:31 N/A N/A Out Taken: Instrument: Other(saline and  gauze) N/A N/A Bleeding: None N/A N/A Debridement Treatment Procedure was tolerated well N/A N/A Response: Post Debridement 2.5x4x0.2 N/A N/A Measurements L x W x D (cm) Post Debridement Volume: 1.571 N/A N/A (cm) Procedures Performed: Debridement N/A N/A [name] ([id number]) Treatment Notes Electronic Signature(s) Signed: 04/24/2021 5:17:14 PM By: Linton Ham MD Entered By: Linton Ham on 04/24/2021 [place], [place]. ([id number]) -------------------------------------------------------------------------------- Multi-Disciplinary Care Plan Details Patient Name: [name] Date of Service: 04/24/2021 8:30 AM Medical Record Number: [id number] Patient Account Number: [id number] Date of Birth/Sex: [birth year] (74 y.o. M) Treating RN: Dolan Amen Primary Care Shaylinn Hladik: Ria Bush Other Clinician: Referring Shaneal Barasch: Referral, Self Treating Lennox Leikam/Extender: Tito Dine in Treatment: 0 Active Inactive Necrotic Tissue Nursing Diagnoses: Impaired tissue integrity related to necrotic/devitalized tissue Knowledge deficit related to management of necrotic/devitalized tissue Goals: Necrotic/devitalized tissue will be minimized in the wound bed Date Initiated: 04/24/2021 Target Resolution Date: 04/24/2021 Goal Status: Active Patient/caregiver will verbalize understanding of reason and process for debridement of necrotic tissue Date Initiated: 04/24/2021 Target Resolution Date: 04/24/2021 Goal Status: Active Interventions: Assess patient pain level pre-, during and post procedure and prior to discharge Provide education on necrotic tissue and debridement process Treatment Activities: Apply topical anesthetic as ordered : 04/24/2021 Enzymatic debridement : 04/24/2021 Excisional debridement : 04/24/2021 Notes: Orientation to the Wound Care Program Nursing Diagnoses: Knowledge deficit related to the wound healing center  program Goals: Patient/caregiver will verbalize understanding of the [place] Date Initiated: 04/24/2021 Target Resolution Date: 04/24/2021 Goal Status: Active Interventions: Provide education on orientation to the wound center Notes: Wound/Skin Impairment Nursing Diagnoses: Impaired tissue integrity Goals: Patient/caregiver will verbalize understanding of skin care regimen Date Initiated: 04/24/2021 Target Resolution Date: 04/24/2021 Goal Status: Active Ulcer/skin breakdown will have a volume reduction of 30% by week 4 Date Initiated: 04/24/2021 Target Resolution Date: 05/25/2021 Goal Status: Active Ulcer/skin breakdown will have a volume reduction of 50% by week 8 Date Initiated: 04/24/2021 Target Resolution Date: 06/24/2021 MESIAH, MANZO ([id number]) Goal Status: Active Ulcer/skin breakdown will have a volume reduction of 80% by week 12 Date Initiated: 04/24/2021 Target Resolution Date: 07/25/2021 Goal Status: Active Ulcer/skin breakdown will heal within 14 weeks Date Initiated: 04/24/2021 Target Resolution Date: 08/25/2021 Goal Status: Active Interventions: Assess patient/caregiver ability to obtain necessary supplies Assess patient/caregiver ability to perform ulcer/skin care regimen upon admission and as needed Assess ulceration(s) every visit Provide education on ulcer and skin care Treatment Activities: Referred to DME Malaky Tetrault  for dressing supplies : 04/24/2021 Skin care regimen initiated : 04/24/2021 Notes: Electronic Signature(s) Signed: 04/24/2021 12:48:14 PM By: Georges Mouse, Minus Breeding RN Entered By: Georges Mouse, Kenia on 04/24/2021 09:30:17 [name] ([id number]) -------------------------------------------------------------------------------- Pain Assessment Details Patient Name: [name] Date of Service: 04/24/2021 8:30 AM Medical Record Number: [id number] Patient Account Number: [id number] Date of Birth/Sex: 1946-07-31 (74 y.o.  M) Treating RN: Carlene Coria Primary Care Jobe Mutch: Ria Bush Other Clinician: Referring Dewaine Morocho: Referral, Self Treating Burkley Dech/Extender: Tito Dine in Treatment: 0 Active Problems Location of Pain Severity and Description of Pain Patient Has Paino No Site Locations Pain Management and Medication Current Pain Management: Electronic Signature(s) Signed: 04/29/2021 2:18:56 PM By: Carlene Coria RN Entered By: Carlene Coria on 04/24/2021 08:54:34 [name] ([id number]) -------------------------------------------------------------------------------- Patient/Caregiver Education Details Patient Name: [name] Date of Service: 04/24/2021 8:30 AM Medical Record Number: [id number] Patient Account Number: [id number] Date of Birth/Gender: January 26, 1946 (74 y.o. M) Treating RN: Dolan Amen Primary Care Physician: Ria Bush Other Clinician: Referring Physician: Referral, Self Treating Physician/Extender: Tito Dine in Treatment: 0 Education Assessment Education Provided To: Patient Education Topics Provided Welcome To [facility]: Methods: Explain/Verbal Responses: State content correctly Wound/Skin Impairment: Methods: Explain/Verbal Responses: State content correctly Electronic Signature(s) Signed: 04/24/2021 12:48:14 PM By: Georges Mouse, Minus Breeding RN Entered By: Georges Mouse, Minus Breeding on 04/24/2021 09:37:27 [name] ([id number]) -------------------------------------------------------------------------------- Wound Assessment Details Patient Name: [name] Date of Service: 04/24/2021 8:30 AM Medical Record Number: [id number] Patient Account Number: [id number] Date of Birth/Sex: 09-24-1946 (74 y.o. M) Treating RN: Carlene Coria Primary Care Lynnix Schoneman: Ria Bush Other Clinician: Referring Concepcion Kirkpatrick: Referral, Self Treating Alida Greiner/Extender: Tito Dine in Treatment: 0 Wound Status Wound  Number: 8 Primary Diabetic Wound/Ulcer of the Lower Extremity Etiology: Wound Location: Right Toe Great Wound Open Wounding Event: Gradually Appeared Status: Date Acquired: 04/07/2021 Comorbid Sleep Apnea, Arrhythmia, Hypertension, Type II Weeks Of Treatment: 0 History: Diabetes, Osteoarthritis, Neuropathy, Received Radiation Clustered Wound: No Photos Wound Measurements Length: (cm) 2.5 Width: (cm) 4 Depth: (cm) 0.2 Area: (cm) 7.854 Volume: (cm) 1.571 % Reduction in Area: % Reduction in Volume: Epithelialization: None Tunneling: No Undermining: No Wound Description Classification: Grade 2 Exudate Amount: Medium Exudate Type: Serosanguineous Exudate Color: red, brown Foul Odor After Cleansing: No Slough/Fibrino Yes Wound Bed Granulation Amount: Small (1-33%) Exposed Structure Granulation Quality: Pink Fascia Exposed: No Necrotic Amount: Large (67-100%) Fat Layer (Subcutaneous Tissue) Exposed: Yes Necrotic Quality: Adherent Slough Tendon Exposed: No Muscle Exposed: No Joint Exposed: No Bone Exposed: No Treatment Notes Wound #8 (Toe Great) Wound Laterality: Right Cleanser [place] Ancillary Kit - 15 Day Supply Discharge Instruction: Use supplies as instructed; Kit contains: (15) Saline Bullets; (15) 3x3 Gauze; 15 pr Gloves Peri-Wound Care DAANISH, COPES. ([id number]) Topical Bacitracin Ointment, 1 (oz) tube Discharge Instruction: Apply lightly at home Primary Dressing Silvercel Small 2x2 (in/in) Discharge Instruction: Apply Silvercel Small 2x2 (in/in) as instructed Secondary Dressing Telfa Adhesive [place] Dressing, 4x4 (in/in) Discharge Instruction: Apply over dressing to secure in place. Gauze Discharge Instruction: Dry gauze on top of silver alginate Secured With Compression Wrap Compression Stockings Add-Ons Electronic Signature(s) Signed: 04/29/2021 2:18:56 PM By: Carlene Coria RN Entered By: Carlene Coria on 04/24/2021 09:15:16 [name]  ([id number]) -------------------------------------------------------------------------------- Vitals Details Patient Name: [name] Date of Service: 04/24/2021 8:30 AM Medical Record Number: [id number] Patient Account Number: [id number] Date of Birth/Sex: 1946/05/18 (74 y.o. M) Treating RN: Carlene Coria Primary Care Arely Tinner: Ria Bush Other Clinician:  Referring Keiondra Brookover: Referral, Self Treating Harshitha Fretz/Extender: Ricard Dillon Weeks in Treatment: 0 Vital Signs Time Taken: 08:54 Temperature (F): 97.7 Height (in): 72 Pulse (bpm): 58 Source: Stated Respiratory Rate (breaths/min): 18 Weight (lbs): 217 Blood Pressure (mmHg): 141/66 Source: Stated Reference Range: 80 - 120 mg / dl Body Mass Index (BMI): 29.4 Electronic Signature(s) Signed: 04/29/2021 2:18:56 PM By: Carlene Coria RN Entered By: Carlene Coria on 04/24/2021 08:55:07

## 2021-04-29 NOTE — Progress Notes (Signed)
CLARA, SMOLEN ([id number]) Visit Report for 04/24/2021 Abuse/Suicide Risk Screen Details Patient Name: [name], [name] Date of Service: 04/24/2021 8:30 AM Medical Record Number: [id number] Patient Account Number: [id number] Date of Birth/Sex: 11-02-1946 (74 y.o. M) Treating RN: Carlene Coria Primary Care Domingos Riggi: Ria Bush Other Clinician: Referring Maily Debarge: Referral, Self Treating Sahas Sluka/Extender: Tito Dine in Treatment: 0 Abuse/Suicide Risk Screen Items Answer ABUSE RISK SCREEN: Has anyone close to you tried to hurt or harm you recentlyo No Do you feel uncomfortable with anyone in your familyo No Has anyone forced you do things that you didnot want to doo No Electronic Signature(s) Signed: 04/29/2021 2:18:56 PM By: Carlene Coria RN Entered By: Carlene Coria on 04/24/2021 08:58:07 [name] ([id number]) -------------------------------------------------------------------------------- Activities of Daily Living Details Patient Name: [name] Date of Service: 04/24/2021 8:30 AM Medical Record Number: [id number] Patient Account Number: [id number] Date of Birth/Sex: 02-12-1946 (74 y.o. M) Treating RN: Carlene Coria Primary Care Abass Misener: Ria Bush Other Clinician: Referring Deziray Nabi: Referral, Self Treating Mateo Overbeck/Extender: Tito Dine in Treatment: 0 Activities of Daily Living Items Answer Activities of Daily Living (Please select one for each item) Drive Automobile Completely Able Take Medications Completely Able Use Telephone Completely Able Care for Appearance Completely Able Use Toilet Completely Able Bath / Shower Completely Able Dress Self Completely Able Feed Self Completely Able Walk Completely Able Get In / Out Bed Completely Able Housework Completely Able Prepare Meals Completely Able Handle Money Completely Able Shop for Self Completely Able Electronic Signature(s) Signed: 04/29/2021 2:18:56 PM By: Carlene Coria  RN Entered By: Carlene Coria on 04/24/2021 08:59:02 [name] ([id number]) -------------------------------------------------------------------------------- Education Screening Details Patient Name: [name] Date of Service: 04/24/2021 8:30 AM Medical Record Number: [id number] Patient Account Number: [id number] Date of Birth/Sex: December 03, 1946 (74 y.o. M) Treating RN: Carlene Coria Primary Care Keith Cancio: Ria Bush Other Clinician: Referring Brityn Mastrogiovanni: Referral, Self Treating Mariesha Venturella/Extender: Tito Dine in Treatment: 0 Learning Preferences/Education Level/Primary Language Learning Preference: Explanation Highest Education Level: College or Above Preferred Language: English Cognitive Barrier Language Barrier: No Translator Needed: No Memory Deficit: No Emotional Barrier: No Cultural/Religious Beliefs Affecting Medical Care: No Physical Barrier Impaired Vision: No Impaired Hearing: No Decreased Hand dexterity: No Knowledge/Comprehension Knowledge Level: Medium Comprehension Level: High Ability to understand written instructions: High Ability to understand verbal instructions: High Motivation Anxiety Level: Calm Cooperation: Cooperative Education Importance: Acknowledges Need Interest in Health Problems: Asks Questions Perception: Coherent Willingness to Engage in Self-Management High Activities: Readiness to Engage in Self-Management High Activities: Electronic Signature(s) Signed: 04/29/2021 2:18:56 PM By: Carlene Coria RN Entered By: Carlene Coria on 04/24/2021 08:59:40 [name] ([id number]) -------------------------------------------------------------------------------- Fall Risk Assessment Details Patient Name: [name] Date of Service: 04/24/2021 8:30 AM Medical Record Number: [id number] Patient Account Number: [id number] Date of Birth/Sex: 08-Dec-1946 (74 y.o. M) Treating RN: Carlene Coria Primary Care Tameka Hoiland: Ria Bush Other Clinician: Referring Katriel Cutsforth: Referral, Self Treating Ivis Henneman/Extender: Tito Dine in Treatment: 0 Fall Risk Assessment Items Have you had 2 or more falls in the last 12 monthso 0 Yes Have you had any fall that resulted in injury in the last 12 monthso 0 Yes FALLS RISK SCREEN History of falling - immediate or within 3 months 25 Yes Secondary diagnosis (Do you have 2 or more medical diagnoseso) 0 No Ambulatory aid None/bed rest/wheelchair/nurse 0 No Crutches/cane/walker 0 No Furniture 0 No Intravenous therapy Access/Saline/Heparin Lock 0 No Gait/Transferring Normal/ bed rest/ wheelchair 0 No Weak (short steps with  or without shuffle, stooped but able to lift head while walking, may 0 No seek support from furniture) Impaired (short steps with shuffle, may have difficulty arising from chair, head down, impaired 0 No balance) Mental Status Oriented to own ability 0 No Electronic Signature(s) Signed: 04/29/2021 2:18:56 PM By: Carlene Coria RN Entered By: Carlene Coria on 04/24/2021 09:00:08 [name] ([id number]) -------------------------------------------------------------------------------- Foot Assessment Details Patient Name: [name] Date of Service: 04/24/2021 8:30 AM Medical Record Number: [id number] Patient Account Number: [id number] Date of Birth/Sex: 18-Jul-1946 (74 y.o. M) Treating RN: Carlene Coria Primary Care Harriet Sutphen: Ria Bush Other Clinician: Referring Pink Maye: Referral, Self Treating Dearius Hoffmann/Extender: Tito Dine in Treatment: 0 Foot Assessment Items Site Locations + = Sensation present, - = Sensation absent, C = Callus, U = Ulcer R = Redness, W = Warmth, M = Maceration, PU = Pre-ulcerative lesion F = Fissure, S = Swelling, D = Dryness Assessment Right: Left: Other Deformity: No No Prior Foot Ulcer: No No Prior Amputation: No No Charcot Joint: No No Ambulatory Status: Ambulatory Without Help Gait:  Steady Electronic Signature(s) Signed: 04/29/2021 2:18:56 PM By: Carlene Coria RN Entered By: Carlene Coria on 04/24/2021 09:03:41 [name] ([id number]) -------------------------------------------------------------------------------- Nutrition Risk Screening Details Patient Name: [name] Date of Service: 04/24/2021 8:30 AM Medical Record Number: [id number] Patient Account Number: [id number] Date of Birth/Sex: [birth year] (74 y.o. M) Treating RN: Carlene Coria Primary Care Cassy Sprowl: Ria Bush Other Clinician: Referring Jayli Fogleman: Referral, Self Treating Aryona Sill/Extender: Tito Dine in Treatment: 0 Height (in): 72 Weight (lbs): 217 Body Mass Index (BMI): 29.4 Nutrition Risk Screening Items Score Screening NUTRITION RISK SCREEN: I have an illness or condition that made me change the kind and/or amount of food I eat 0 No I eat fewer than two meals per day 0 No I eat few fruits and vegetables, or milk products 0 No I have three or more drinks of beer, liquor or wine almost every day 0 No I have tooth or mouth problems that make it hard for me to eat 0 No I don't always have enough money to buy the food I need 0 No I eat alone most of the time 0 No I take three or more different prescribed or over-the-counter drugs a day 1 Yes Without wanting to, I have lost or gained 10 pounds in the last six months 0 No I am not always physically able to shop, cook and/or feed myself 0 No Nutrition Protocols Good Risk Protocol 0 No interventions needed Moderate Risk Protocol High Risk Proctocol Risk Level: Good Risk Score: 1 Electronic Signature(s) Signed: 04/29/2021 2:18:56 PM By: Carlene Coria RN Entered By: Carlene Coria on 04/24/2021 09:01:01

## 2021-04-29 NOTE — Progress Notes (Signed)
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 Thirst). Genitourinary Denies complaints or symptoms of Kidney failure/ Dialysis, Incontinence/dribbling. Immunological Denies complaints or symptoms of Hives, Itching. Integumentary (Skin) Complains or has symptoms of Wounds. Denies complaints or symptoms of Bleeding or bruising tendency, Breakdown, Swelling. Musculoskeletal Denies complaints or symptoms of Muscle Pain, Muscle Weakness. Neurologic Denies complaints or symptoms of Numbness/parasthesias, Focal/Weakness. Psychiatric Denies complaints or symptoms of Anxiety, Claustrophobia. Objective Constitutional Vitals Time Taken: 8:54 AM, Height: 72 in, Source: Stated, Weight: 217 lbs, Source: Stated, BMI: 29.4, Temperature: 97.7 F, Pulse: 58 bpm, Respiratory Rate: 18 breaths/min, Blood Pressure: 141/66  mmHg. Respiratory Respiratory effort is easy and symmetric bilaterally. Rate is normal at rest and on room air.. Cardiovascular Both dorsalis pedis pulses and posterior tibial pulses on the left are vibrant. General Notes: Wound exam; the patient has wound going from the left medial aspect to the left dorsal aspect of the great toe. The top of the wound comes to the base of the nail. There is no gross surrounding infection here. Wound surface looks reasonably healthy. I did not think there was anything to culture no mechanical debridement. Integumentary (Hair, Skin) Wound #8 status is Open. Original cause of wound was Gradually Appeared. The date acquired was: 04/07/2021. The wound is located on the Right Toe Great. The wound measures 2.5cm length x 4cm width x 0.2cm depth; 7.854cm^2 area and 1.571cm^3 volume. There is Fat Layer (Subcutaneous Tissue) exposed. There is no tunneling or undermining noted. There is a medium amount of serosanguineous drainage noted. There is small (1-33%) pink granulation within the wound bed. There is a large (67-100%) amount of necrotic tissue within the wound bed including Adherent Slough. 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 There was a Chemical/Enzymatic/Mechanical debridement performed by  Ricard Dillon, MD. With the following instrument(s): saline and gauze. Other agent used was saline gauze. A time out was conducted at 09:31, prior to the start of the procedure.  There was no bleeding. The procedure was tolerated well. Post Debridement Measurements: 2.5cm length x 4cm width x 0.2cm depth; 1.571cm^3 volume. Character of Wound/Ulcer Post Debridement is stable. Severity of Tissue Post Debridement is: Fat layer exposed. Post procedure Diagnosis Wound #8: Same as Pre-Procedure Plan Follow-up Appointments: Return Appointment in 1 week. Bathing/ Shower/ Hygiene: Wash wounds with antibacterial soap and water. Off-Loading: Open toe surgical shoe Medications-Please add to medication list.: P.O. Antibiotics - Continue taking Bactrim WOUND #8: - Toe Great Wound Laterality: Right Cleanser: [place] Ancillary Kit - 15 Day Supply (DME) (Generic) 1 x Per Day/15 Days Discharge Instructions: Use supplies as instructed; Kit contains: (15) Saline Bullets; (15) 3x3 Gauze; 15 pr Gloves Topical: Bacitracin Ointment, 1 (oz) tube 1 x Per Day/15 Days Discharge Instructions: Apply lightly at home Primary Dressing: Silvercel Small 2x2 (in/in) (DME) (Generic) 1 x Per Day/15 Days Discharge Instructions: Apply Silvercel Small 2x2 (in/in) as instructed Secondary Dressing: Gauze (DME) (Generic) 1 x Per Day/15 Days Discharge Instructions: Dry gauze on top of silver alginate Secondary Dressing: [place] Dressing, 4x4 (in/in) 1 x Per Day/15 Days Discharge Instructions: Apply over dressing to secure in place. #1 I agree with the Bactrim for their MRSA infection. He should be completing this around the 20th I estimate 2. He will clean this off daily with soap and water apply bacitracin lightly and silver alginate. 3. At this point I do not think that he requires an x-ray or further cultures 4. Based on clinical exam and or ABIs I do not believe he needs noninvasive arterial studies either. 5. It seems to me from their description the wound is actually better and the toe looks better. I am assuming this is because of the antibiotics. 6. We will see him back next  week Electronic Signature(s) Signed: 04/24/2021 5:17:14 PM By: Linton Ham MD Entered By: Linton Ham on 04/24/2021 09:50:49 [name] ([id number]) -------------------------------------------------------------------------------- ROS/PFSH Details Patient Name: [name] Date of Service: 04/24/2021 8:30 AM Medical Record Number: [id number] Patient Account Number: [id number] Date of Birth/Sex: 25-Feb-1946 (74 y.o. M) Treating RN: Carlene Coria Primary Care Provider: Ria Bush Other Clinician: Referring Provider: Referral, Self Treating Provider/Extender: Tito Dine in Treatment: 0 Information Obtained From Patient Constitutional Symptoms (General Health) Complaints and Symptoms: Negative for: Fatigue; Fever; Chills; Marked Weight Change Eyes Complaints and Symptoms: Negative for: Dry Eyes; Vision Changes; Glasses / Contacts Medical History: Negative for: Cataracts; Glaucoma; Optic Neuritis Ear/Nose/Mouth/Throat Complaints and Symptoms: Negative for: Difficult clearing ears; Sinusitis Medical History: Negative for: Chronic sinus problems/congestion; Middle ear problems Hematologic/Lymphatic Complaints and Symptoms: Negative for: Bleeding / Clotting Disorders; Human Immunodeficiency Virus Medical History: Negative for: Anemia; Hemophilia; Human Immunodeficiency Virus; Lymphedema; Sickle Cell Disease Respiratory Complaints and Symptoms: Negative for: Chronic or frequent coughs; Shortness of Breath Medical History: Positive for: Sleep Apnea Negative for: Aspiration; Asthma; Chronic Obstructive Pulmonary Disease (COPD); Pneumothorax; Tuberculosis Cardiovascular Complaints and Symptoms: Negative for: Chest pain; LE edema Medical History: Positive for: Arrhythmia - a fib; Hypertension Negative for: Angina; Congestive Heart Failure; Coronary Artery Disease; Deep Vein Thrombosis; Hypotension; Myocardial Infarction; Peripheral Arterial Disease;  Peripheral Venous Disease; Phlebitis; Vasculitis Gastrointestinal Complaints and Symptoms: Negative for: Frequent diarrhea; Nausea; Vomiting Medical History: Negative for: Cirrhosis ; Colitis; Crohnos; Hepatitis A; Hepatitis B; Hepatitis C Endocrine Wolter, Dhaval W. (  [id number]) Complaints and Symptoms: Negative for: Hepatitis; Thyroid disease; Polydypsia (Excessive Thirst) Medical History: Positive for: Type II Diabetes Negative for: Type I Diabetes Time with diabetes: 10 Treated with: Oral agents Blood sugar tested every day: Yes Tested : Genitourinary Complaints and Symptoms: Negative for: Kidney failure/ Dialysis; Incontinence/dribbling Medical History: Negative for: End Stage Renal Disease Immunological Complaints and Symptoms: Negative for: Hives; Itching Medical History: Negative for: Lupus Erythematosus; Raynaudos; Scleroderma Integumentary (Skin) Complaints and Symptoms: Positive for: Wounds Negative for: Bleeding or bruising tendency; Breakdown; Swelling Medical History: Negative for: History of Burn; History of pressure wounds Musculoskeletal Complaints and Symptoms: Negative for: Muscle Pain; Muscle Weakness Medical History: Positive for: Osteoarthritis Negative for: Gout; Rheumatoid Arthritis; Osteomyelitis Neurologic Complaints and Symptoms: Negative for: Numbness/parasthesias; Focal/Weakness Medical History: Positive for: Neuropathy Negative for: Dementia; Quadriplegia; Paraplegia; Seizure Disorder Psychiatric Complaints and Symptoms: Negative for: Anxiety; Claustrophobia Medical History: Negative for: Anorexia/bulimia; Confinement Anxiety Oncologic Medical History: Positive for: Received Radiation Negative for: Received Chemotherapy Past Medical History Notes: prostate cancer Immunizations HUTCH, RHETT. ([id number]) Pneumococcal Vaccine: Received Pneumococcal Vaccination: No Implantable Devices None Family and Social History Cancer: Yes -  Siblings; Diabetes: Yes - Father,Mother,Siblings; Heart Disease: Yes - Father; Hereditary Spherocytosis: No; Hypertension: Yes - Father; Kidney Disease: No; Lung Disease: Yes - Father; Seizures: No; Stroke: No; Thyroid Problems: No; Tuberculosis: No; Current every day smoker; Marital Status - Married; Alcohol Use: Rarely; Drug Use: No History; Caffeine Use: Daily; Financial Concerns: No; Food, Clothing or Shelter Needs: No; Support System Lacking: No; Transportation Concerns: No Electronic Signature(s) Signed: 04/24/2021 5:17:14 PM By: Linton Ham MD Signed: 04/29/2021 2:18:56 PM By: Carlene Coria RN Entered By: Carlene Coria on 04/24/2021 08:57:56 [name] ([id number]) -------------------------------------------------------------------------------- SuperBill Details Patient Name: [name] Date of Service: 04/24/2021 Medical Record Number: [id number] Patient Account Number: [id number] Date of Birth/Sex: 1946-10-26 (74 y.o. 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 classified elsewhere Facility Procedures CPT4 Code: [id number] Description: 99214 - WOUND CARE VISIT-LEV 4 EST PT Modifier: Quantity: 1 Physician Procedures CPT4 Code Description: [id number] WC PHYS LEVEL 3 o NEW PT Modifier: Quantity: 1 CPT4 Code Description: ICD-10 Diagnosis Description E11.621 Type 2 diabetes mellitus with foot ulcer L97.521 Non-pressure chronic ulcer of other part of left foot limited to breakd E11.42 Type 2 diabetes  mellitus with diabetic polyneuropathy B95.62  Methicillin resistant Staphylococcus aureus infection as the cause of d Modifier: own of skin iseases classified elsew Quantity: here Electronic Signature(s) Signed: 04/24/2021 5:17:14 PM By: Linton Ham MD Entered By: Linton Ham on 04/24/2021 09:51:31

## 2021-05-01 ENCOUNTER — Encounter: Payer: Medicare Other | Admitting: Internal Medicine

## 2021-05-01 ENCOUNTER — Other Ambulatory Visit: Payer: Self-pay

## 2021-05-01 DIAGNOSIS — L97522 Non-pressure chronic ulcer of other part of left foot with fat layer exposed: Secondary | ICD-10-CM | POA: Diagnosis not present

## 2021-05-01 DIAGNOSIS — E11621 Type 2 diabetes mellitus with foot ulcer: Secondary | ICD-10-CM | POA: Diagnosis not present

## 2021-05-01 DIAGNOSIS — E1151 Type 2 diabetes mellitus with diabetic peripheral angiopathy without gangrene: Secondary | ICD-10-CM | POA: Diagnosis not present

## 2021-05-01 DIAGNOSIS — E1142 Type 2 diabetes mellitus with diabetic polyneuropathy: Secondary | ICD-10-CM | POA: Diagnosis not present

## 2021-05-01 DIAGNOSIS — I10 Essential (primary) hypertension: Secondary | ICD-10-CM | POA: Diagnosis not present

## 2021-05-01 DIAGNOSIS — L97521 Non-pressure chronic ulcer of other part of left foot limited to breakdown of skin: Secondary | ICD-10-CM | POA: Diagnosis not present

## 2021-05-01 DIAGNOSIS — I480 Paroxysmal atrial fibrillation: Secondary | ICD-10-CM | POA: Diagnosis not present

## 2021-05-02 DIAGNOSIS — Z6831 Body mass index (BMI) 31.0-31.9, adult: Secondary | ICD-10-CM | POA: Diagnosis not present

## 2021-05-02 DIAGNOSIS — K4020 Bilateral inguinal hernia, without obstruction or gangrene, not specified as recurrent: Secondary | ICD-10-CM | POA: Diagnosis not present

## 2021-05-02 NOTE — Progress Notes (Signed)
TYCE, DELCID ([id number]) Visit Report for 05/01/2021 Arrival Information Details Patient Name: [name], [name] Date of Service: 05/01/2021 9:45 AM Medical Record Number: [id number] Patient Account Number: [id number] Date of Birth/Sex: Jan 28, 1946 (75 y.o. M) Treating RN: Cornell Barman Primary Care Cierah Crader: Ria Bush Other Clinician: Referring Ziare Orrick: Ria Bush Treating Arnisha Laffoon/Extender: Tito Dine in Treatment: 1 Visit Information History Since Last Visit Added or deleted any medications: No Patient Arrived: Kasandra Knudsen Had a fall or experienced change in No Arrival Time: 09:50 activities of daily living that may affect Accompanied By: self risk of falls: Transfer Assistance: None Hospitalized since last visit: No Patient Identification Verified: Yes Pain Present Now: No Secondary Verification Process Completed: Yes Patient Requires Transmission-Based No Precautions: Patient Has Alerts: Yes Patient Alerts: Patient on Blood Thinner DIABETIC Electronic Signature(s) Signed: 05/02/2021 4:37:07 PM By: Jeanine Luz Entered By: Jeanine Luz on 05/01/2021 09:51:04 [name] ([id number]) -------------------------------------------------------------------------------- Clinic Level of Care Assessment Details Patient Name: [name] Date of Service: 05/01/2021 9:45 AM Medical Record Number: [id number] Patient Account Number: [id number] Date of Birth/Sex: 11/18/1946 (75 y.o. M) Treating RN: Cornell Barman Primary Care Niles Ess: Ria Bush Other Clinician: Referring Synda Bagent: Ria Bush Treating Aquil Duhe/Extender: Tito Dine in Treatment: 1 Clinic Level of Care Assessment Items TOOL 4 Quantity Score []  - Use when only an EandM is performed on FOLLOW-UP visit 0 ASSESSMENTS - Nursing Assessment / Reassessment X - Reassessment of Co-morbidities (includes updates in patient status) 1 10 X- 1 5 Reassessment of Adherence to  Treatment Plan ASSESSMENTS - Wound and Skin Assessment / Reassessment X - Simple Wound Assessment / Reassessment - one wound 1 5 []  - 0 Complex Wound Assessment / Reassessment - multiple wounds []  - 0 Dermatologic / Skin Assessment (not related to wound area) ASSESSMENTS - Focused Assessment []  - Circumferential Edema Measurements - multi extremities 0 []  - 0 Nutritional Assessment / Counseling / Intervention []  - 0 Lower Extremity Assessment (monofilament, tuning fork, pulses) []  - 0 Peripheral Arterial Disease Assessment (using hand held doppler) ASSESSMENTS - Ostomy and/or Continence Assessment and Care []  - Incontinence Assessment and Management 0 []  - 0 Ostomy Care Assessment and Management (repouching, etc.) PROCESS - Coordination of Care X - Simple Patient / Family Education for ongoing care 1 15 []  - 0 Complex (extensive) Patient / Family Education for ongoing care X- 1 10 Staff obtains Programmer, systems, Records, Test Results / Process Orders []  - 0 Staff telephones HHA, Nursing Homes / Clarify orders / etc []  - 0 Routine Transfer to another Facility (non-emergent condition) []  - 0 Routine Hospital Admission (non-emergent condition) []  - 0 New Admissions / Biomedical engineer / Ordering NPWT, Apligraf, etc. []  - 0 Emergency Hospital Admission (emergent condition) X- 1 10 Simple Discharge Coordination []  - 0 Complex (extensive) Discharge Coordination PROCESS - Special Needs []  - Pediatric / Minor Patient Management 0 []  - 0 Isolation Patient Management []  - 0 Hearing / Language / Visual special needs []  - 0 Assessment of Community assistance (transportation, D/C planning, etc.) []  - 0 Additional assistance / Altered mentation []  - 0 Support Surface(s) Assessment (bed, cushion, seat, etc.) INTERVENTIONS - Wound Cleansing / Measurement SEAVER, MACHIA. ([id number]) X- 1 5 Simple Wound Cleansing - one wound []  - 0 Complex Wound Cleansing - multiple wounds X- 1  5 Wound Imaging (photographs - any number of wounds) []  - 0 Wound Tracing (instead of photographs) X- 1 5 Simple Wound Measurement - one wound []  - 0 Complex  Wound Measurement - multiple wounds INTERVENTIONS - Wound Dressings $RemoveBeforeD'[]'AmztzkftwjlQDM$  - Small Wound Dressing one or multiple wounds 0 X- 1 15 Medium Wound Dressing one or multiple wounds $RemoveBeforeD'[]'bCCUoYDuOLWbwp$  - 0 Large Wound Dressing one or multiple wounds $RemoveBeforeD'[]'rxPenQKzZxQGHU$  - 0 Application of Medications - topical $RemoveB'[]'TuyiPxBS$  - 0 Application of Medications - injection INTERVENTIONS - Miscellaneous $RemoveBeforeD'[]'rIwCvGaNsKQXmK$  - External ear exam 0 $Remo'[]'LzWrx$  - 0 Specimen Collection (cultures, biopsies, blood, body fluids, etc.) $RemoveBefor'[]'dQmoCQvIKoIt$  - 0 Specimen(s) / Culture(s) sent or taken to Lab for analysis $RemoveBefo'[]'fzSlcDeWhnv$  - 0 Patient Transfer (multiple staff / Civil Service fast streamer / Similar devices) $RemoveBeforeDE'[]'KHIewxLiRiDkyUK$  - 0 Simple Staple / Suture removal (25 or less) $Remove'[]'WLvvaLS$  - 0 Complex Staple / Suture removal (26 or more) $Remove'[]'qSxADeZ$  - 0 Hypo / Hyperglycemic Management (close monitor of Blood Glucose) $RemoveBefore'[]'xrFjAMIxYADSy$  - 0 Ankle / Brachial Index (ABI) - do not check if billed separately X- 1 5 Vital Signs Has the patient been seen at the hospital within the last three years: Yes Total Score: 90 Level Of Care: New/Established - Level 3 Electronic Signature(s) Signed: 05/02/2021 9:33:55 AM By: Gretta Cool, BSN, RN, CWS, Kim RN, BSN Entered By: Gretta Cool, BSN, RN, CWS, Kim on 05/01/2021 10:37:02 [name] ([id number]) -------------------------------------------------------------------------------- Encounter Discharge Information Details Patient Name: [name] Date of Service: 05/01/2021 9:45 AM Medical Record Number: [id number] Patient Account Number: [id number] Date of Birth/Sex: 30-Jun-1946 (75 y.o. M) Treating RN: Carlene Coria Primary Care Rakan Soffer: Ria Bush Other Clinician: Referring Elin Fenley: Ria Bush Treating Keeton Kassebaum/Extender: Tito Dine in Treatment: 1 Encounter Discharge Information Items Discharge Condition: Stable Ambulatory Status:  Ambulatory Discharge Destination: Home Transportation: Private Auto Accompanied By: self Schedule Follow-up Appointment: Yes Clinical Summary of Care: Patient Declined Electronic Signature(s) Signed: 05/01/2021 4:53:29 PM By: Carlene Coria RN Entered By: Carlene Coria on 05/01/2021 10:45:39 [name] ([id number]) -------------------------------------------------------------------------------- Lower Extremity Assessment Details Patient Name: [name] Date of Service: 05/01/2021 9:45 AM Medical Record Number: [id number] Patient Account Number: [id number] Date of Birth/Sex: Mar 14, 1946 (74 y.o. M) Treating RN: Cornell Barman Primary Care Jatavion Peaster: Ria Bush Other Clinician: Referring Shannell Mikkelsen: Ria Bush Treating Saloma Cadena/Extender: Tito Dine in Treatment: 1 Edema Assessment Assessed: [Left: Yes] [Right: No] [Left: Edema] [Right: :] Calf Left: Right: Point of Measurement: 39 cm From Medial Instep 33 cm Ankle Left: Right: Point of Measurement: 10 cm From Medial Instep 22 cm Knee To Floor Left: Right: From Medial Instep 47 cm Vascular Assessment Pulses: Dorsalis Pedis Palpable: [Left:Yes] Electronic Signature(s) Signed: 05/02/2021 9:33:55 AM By: Gretta Cool, BSN, RN, CWS, Kim RN, BSN Signed: 05/02/2021 4:37:07 PM By: Jeanine Luz Entered By: Jeanine Luz on 05/01/2021 10:03:42 [name] ([id number]) -------------------------------------------------------------------------------- Multi Wound Chart Details Patient Name: [name] Date of Service: 05/01/2021 9:45 AM Medical Record Number: [id number] Patient Account Number: [id number] Date of Birth/Sex: March 03, 1946 (74 y.o. M) Treating RN: Cornell Barman Primary Care Ioma Chismar: Ria Bush Other Clinician: Referring Launi Asencio: Ria Bush Treating Jonalyn Sedlak/Extender: Tito Dine in Treatment: 1 Vital Signs Height(in): 72 Pulse(bpm): 66 Weight(lbs): 217 Blood  Pressure(mmHg): 125/66 Body Mass Index(BMI): 29 Temperature(F): 98.0 Respiratory Rate(breaths/min): 18 Photos: [N/A:N/A] Wound Location: Left Toe Great N/A N/A Wounding Event: Gradually Appeared N/A N/A Primary Etiology: Diabetic Wound/Ulcer of the Lower N/A N/A Extremity Comorbid History: Sleep Apnea, Arrhythmia, N/A N/A Hypertension, Type II Diabetes, Osteoarthritis, Neuropathy, Received Radiation Date Acquired: 04/07/2021 N/A N/A Weeks of Treatment: 1 N/A N/A Wound Status: Open N/A N/A Measurements L x W x D (cm) 1.6x2.5x0.1 N/A N/A Area (cm) : 3.142  N/A N/A Volume (cm) : 0.314 N/A N/A % Reduction in Area: 60.00% N/A N/A % Reduction in Volume: 80.00% N/A N/A Classification: Grade 2 N/A N/A Exudate Amount: Medium N/A N/A Exudate Type: Serosanguineous N/A N/A Exudate Color: red, brown N/A N/A Granulation Amount: Small (1-33%) N/A N/A Granulation Quality: Pink N/A N/A Necrotic Amount: Large (67-100%) N/A N/A Exposed Structures: Fat Layer (Subcutaneous Tissue): N/A N/A Yes Fascia: No Tendon: No Muscle: No Joint: No Bone: No Epithelialization: None N/A N/A Treatment Notes Electronic Signature(s) Signed: 05/02/2021 8:39:06 AM By: Linton Ham MD Entered By: Linton Ham on 05/01/2021 10:38:03 [name] ([id number]) -------------------------------------------------------------------------------- Multi-Disciplinary Care Plan Details Patient Name: [name] Date of Service: 05/01/2021 9:45 AM Medical Record Number: [id number] Patient Account Number: [id number] Date of Birth/Sex: 1946-02-02 (74 y.o. M) Treating RN: Cornell Barman Primary Care Aristotle Lieb: Ria Bush Other Clinician: Referring Taylin Leder: Ria Bush Treating Daniele Yankowski/Extender: Tito Dine in Treatment: 1 Active Inactive Necrotic Tissue Nursing Diagnoses: Impaired tissue integrity related to necrotic/devitalized tissue Knowledge deficit related to management of  necrotic/devitalized tissue Goals: Necrotic/devitalized tissue will be minimized in the wound bed Date Initiated: 04/24/2021 Target Resolution Date: 04/24/2021 Goal Status: Active Patient/caregiver will verbalize understanding of reason and process for debridement of necrotic tissue Date Initiated: 04/24/2021 Target Resolution Date: 04/24/2021 Goal Status: Active Interventions: Assess patient pain level pre-, during and post procedure and prior to discharge Provide education on necrotic tissue and debridement process Treatment Activities: Apply topical anesthetic as ordered : 04/24/2021 Enzymatic debridement : 04/24/2021 Excisional debridement : 04/24/2021 Notes: Orientation to the Wound Care Program Nursing Diagnoses: Knowledge deficit related to the wound healing center program Goals: Patient/caregiver will verbalize understanding of the [place] Date Initiated: 04/24/2021 Target Resolution Date: 04/24/2021 Goal Status: Active Interventions: Provide education on orientation to the wound center Notes: Wound/Skin Impairment Nursing Diagnoses: Impaired tissue integrity Goals: Patient/caregiver will verbalize understanding of skin care regimen Date Initiated: 04/24/2021 Target Resolution Date: 04/24/2021 Goal Status: Active Ulcer/skin breakdown will have a volume reduction of 30% by week 4 Date Initiated: 04/24/2021 Target Resolution Date: 05/25/2021 Goal Status: Active Ulcer/skin breakdown will have a volume reduction of 50% by week 8 Date Initiated: 04/24/2021 Target Resolution Date: 06/24/2021 KALOB, BERGEN ([id number]) Goal Status: Active Ulcer/skin breakdown will have a volume reduction of 80% by week 12 Date Initiated: 04/24/2021 Target Resolution Date: 07/25/2021 Goal Status: Active Ulcer/skin breakdown will heal within 14 weeks Date Initiated: 04/24/2021 Target Resolution Date: 08/25/2021 Goal Status: Active Interventions: Assess patient/caregiver  ability to obtain necessary supplies Assess patient/caregiver ability to perform ulcer/skin care regimen upon admission and as needed Assess ulceration(s) every visit Provide education on ulcer and skin care Treatment Activities: Referred to DME Ernestyne Caldwell for dressing supplies : 04/24/2021 Skin care regimen initiated : 04/24/2021 Notes: Electronic Signature(s) Signed: 05/02/2021 9:33:55 AM By: Gretta Cool, BSN, RN, CWS, Kim RN, BSN Entered By: Gretta Cool, BSN, RN, CWS, Kim on 05/01/2021 10:34:18 [name] ([id number]) -------------------------------------------------------------------------------- Pain Assessment Details Patient Name: [name] Date of Service: 05/01/2021 9:45 AM Medical Record Number: [id number] Patient Account Number: [id number] Date of Birth/Sex: 11-13-1946 (74 y.o. M) Treating RN: Cornell Barman Primary Care Ziza Hastings: Ria Bush Other Clinician: Referring Ostin Mathey: Ria Bush Treating Aisling Emigh/Extender: Tito Dine in Treatment: 1 Active Problems Location of Pain Severity and Description of Pain Patient Has Paino No Site Locations Rate the pain. Current Pain Level: 0 Pain Management and Medication Current Pain Management: Electronic Signature(s) Signed: 05/02/2021 9:33:55 AM By: Gretta Cool, BSN, RN,  CWS, Romie Minus, BSN Signed: 05/02/2021 4:37:07 PM By: Jeanine Luz Entered By: Jeanine Luz on 05/01/2021 09:54:41 [name] ([id number]) -------------------------------------------------------------------------------- Patient/Caregiver Education Details Patient Name: [name] Date of Service: 05/01/2021 9:45 AM Medical Record Number: [id number] Patient Account Number: [id number] Date of Birth/Gender: 1946/05/01 (74 y.o. M) Treating RN: Cornell Barman Primary Care Physician: Ria Bush Other Clinician: Referring Physician: Ria Bush Treating Physician/Extender: Tito Dine in Treatment: 1 Education  Assessment Education Provided To: Caregiver Education Topics Provided Wound/Skin Impairment: Handouts: Caring for Your Ulcer, Other: Keep offloading shoe away from dogs. Methods: Demonstration, Explain/Verbal Responses: State content correctly Electronic Signature(s) Signed: 05/02/2021 9:33:55 AM By: Gretta Cool, BSN, RN, CWS, Kim RN, BSN Entered By: Gretta Cool, BSN, RN, CWS, Kim on 05/01/2021 10:38:13 [name] ([id number]) -------------------------------------------------------------------------------- Wound Assessment Details Patient Name: [name] Date of Service: 05/01/2021 9:45 AM Medical Record Number: [id number] Patient Account Number: [id number] Date of Birth/Sex: 22-Dec-1945 (74 y.o. M) Treating RN: Cornell Barman Primary Care Bettylee Feig: Ria Bush Other Clinician: Referring Gerry Heaphy: Ria Bush Treating Corbitt Cloke/Extender: Tito Dine in Treatment: 1 Wound Status Wound Number: 8 Primary Diabetic Wound/Ulcer of the Lower Extremity Etiology: Wound Location: Left Toe Great Wound Open Wounding Event: Gradually Appeared Status: Date Acquired: 04/07/2021 Comorbid Sleep Apnea, Arrhythmia, Hypertension, Type II Weeks Of Treatment: 1 History: Diabetes, Osteoarthritis, Neuropathy, Received Radiation Clustered Wound: No Photos Wound Measurements Length: (cm) 1.6 Width: (cm) 2.5 Depth: (cm) 0.1 Area: (cm) 3.142 Volume: (cm) 0.314 % Reduction in Area: 60% % Reduction in Volume: 80% Epithelialization: None Tunneling: No Undermining: No Wound Description Classification: Grade 2 Exudate Amount: Medium Exudate Type: Serosanguineous Exudate Color: red, brown Foul Odor After Cleansing: No Slough/Fibrino Yes Wound Bed Granulation Amount: Small (1-33%) Exposed Structure Granulation Quality: Pink Fascia Exposed: No Necrotic Amount: Large (67-100%) Fat Layer (Subcutaneous Tissue) Exposed: Yes Tendon Exposed: No Muscle Exposed: No Joint Exposed:  No Bone Exposed: No Treatment Notes Wound #8 (Toe Great) Wound Laterality: Left Cleanser [place] Ancillary Kit - 15 Day Supply Discharge Instruction: Use supplies as instructed; Kit contains: (15) Saline Bullets; (15) 3x3 Gauze; 15 pr Gloves Peri-Wound Care TRYCE, SURRATT ([id number]) Topical Primary Dressing Silvercel Small 2x2 (in/in) Discharge Instruction: Apply Silvercel Small 2x2 (in/in) as instructed Secondary Dressing Telfa Adhesive [place] Dressing, 4x4 (in/in) Discharge Instruction: Apply over dressing to secure in place. Secured With Compression Wrap Compression Stockings Environmental education officer) Signed: 05/02/2021 9:33:55 AM By: Gretta Cool, BSN, RN, CWS, Kim RN, BSN Entered By: Gretta Cool, BSN, RN, CWS, Kim on 05/01/2021 10:32:40 [name] ([id number]) -------------------------------------------------------------------------------- Vitals Details Patient Name: [name] Date of Service: 05/01/2021 9:45 AM Medical Record Number: [id number] Patient Account Number: [id number] Date of Birth/Sex: [birth year] (74 y.o. M) Treating RN: Cornell Barman Primary Care Sherley Leser: Ria Bush Other Clinician: Referring Brylen Wagar: Ria Bush Treating Britnee Mcdevitt/Extender: Tito Dine in Treatment: 1 Vital Signs Time Taken: 09:51 Temperature (F): 98.0 Height (in): 72 Pulse (bpm): 52 Weight (lbs): 217 Respiratory Rate (breaths/min): 18 Body Mass Index (BMI): 29.4 Blood Pressure (mmHg): 125/66 Reference Range: 80 - 120 mg / dl Electronic Signature(s) Signed: 05/02/2021 4:37:07 PM By: Jeanine Luz Entered By: Jeanine Luz on 05/01/2021 09:54:33
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
## 2021-05-06 ENCOUNTER — Other Ambulatory Visit: Payer: Self-pay | Admitting: Family Medicine

## 2021-05-08 ENCOUNTER — Other Ambulatory Visit: Payer: Self-pay | Admitting: Family Medicine

## 2021-05-08 ENCOUNTER — Ambulatory Visit: Payer: Medicare Other | Admitting: Internal Medicine

## 2021-05-08 DIAGNOSIS — G894 Chronic pain syndrome: Secondary | ICD-10-CM

## 2021-05-08 NOTE — Telephone Encounter (Signed)
Name of Medication: Hydrocodone-APAP Name of Pharmacy: [facility] Last Fill or Written Date and Quantity: 04/10/21, #120 Last Office Visit and Type: 04/19/21, diabetic ulcer Next Office Visit and Type: 06/04/21, AWV prt 2 Last Controlled Substance Agreement Date: 07/06/17 Last UDS: 02/03/18

## 2021-05-10 MED ORDER — HYDROCODONE-ACETAMINOPHEN 10-325 MG PO TABS: 1.0000 | ORAL_TABLET | Freq: Four times a day (QID) | ORAL | 0 refills | Status: DC | PRN

## 2021-05-10 NOTE — Telephone Encounter (Signed)
ERx 

## 2021-05-15 ENCOUNTER — Encounter: Payer: Medicare Other | Attending: Internal Medicine | Admitting: Internal Medicine

## 2021-05-15 ENCOUNTER — Ambulatory Visit (INDEPENDENT_AMBULATORY_CARE_PROVIDER_SITE_OTHER): Payer: Medicare Other

## 2021-05-15 ENCOUNTER — Telehealth: Payer: Self-pay | Admitting: Cardiovascular Disease

## 2021-05-15 ENCOUNTER — Other Ambulatory Visit: Payer: Self-pay

## 2021-05-15 DIAGNOSIS — I480 Paroxysmal atrial fibrillation: Secondary | ICD-10-CM | POA: Insufficient documentation

## 2021-05-15 DIAGNOSIS — Z7901 Long term (current) use of anticoagulants: Secondary | ICD-10-CM | POA: Diagnosis not present

## 2021-05-15 DIAGNOSIS — I10 Essential (primary) hypertension: Secondary | ICD-10-CM | POA: Diagnosis not present

## 2021-05-15 DIAGNOSIS — Z5181 Encounter for therapeutic drug level monitoring: Secondary | ICD-10-CM

## 2021-05-15 DIAGNOSIS — L97521 Non-pressure chronic ulcer of other part of left foot limited to breakdown of skin: Secondary | ICD-10-CM | POA: Diagnosis not present

## 2021-05-15 DIAGNOSIS — I4891 Unspecified atrial fibrillation: Secondary | ICD-10-CM

## 2021-05-15 DIAGNOSIS — E1142 Type 2 diabetes mellitus with diabetic polyneuropathy: Secondary | ICD-10-CM | POA: Insufficient documentation

## 2021-05-15 DIAGNOSIS — E11621 Type 2 diabetes mellitus with foot ulcer: Secondary | ICD-10-CM | POA: Diagnosis not present

## 2021-05-15 DIAGNOSIS — E1151 Type 2 diabetes mellitus with diabetic peripheral angiopathy without gangrene: Secondary | ICD-10-CM | POA: Diagnosis not present

## 2021-05-15 DIAGNOSIS — L97529 Non-pressure chronic ulcer of other part of left foot with unspecified severity: Secondary | ICD-10-CM | POA: Diagnosis not present

## 2021-05-15 LAB — POCT INR: INR: 1.8 — AB (ref 2.0–3.0)

## 2021-05-15 NOTE — Progress Notes (Signed)
DERL, ABALOS ([id number]) Visit Report for 05/15/2021 Arrival Information Details Patient Name: [name] Date of Service: 05/15/2021 10:00 AM Medical Record Number: [id number] Patient Account Number: [id number] Date of Birth/Sex: [birth year] (75 y.o. M) Treating Moore: Donnamarie Poag Primary Care Estephania Licciardi: Ria Bush Other Clinician: Referring Adolphus Hanf: Ria Bush Treating Reed Eifert/Extender: Tito Dine in Treatment: 3 Visit Information History Since Last Visit Added or deleted any medications: No Patient Arrived: [name] Had a fall or experienced change in No Arrival Time: 10:06 activities of daily living that may affect Accompanied By: self risk of falls: Transfer Assistance: None Hospitalized since last visit: No Patient Identification Verified: Yes Has Dressing in Place as Prescribed: Yes Secondary Verification Process Completed: Yes Pain Present Now: No Patient Requires Transmission-Based No Precautions: Patient Has Alerts: Yes Patient Alerts: Patient on Blood Thinner DIABETIC Electronic Signature(s) Signed: 05/15/2021 2:11:15 PM By: Donnamarie Poag Entered By: Donnamarie Poag on 05/15/2021 10:06:41 [name] ([id number]) -------------------------------------------------------------------------------- Clinic Level of Care Assessment Details Patient Name: [name] Date of Service: 05/15/2021 10:00 AM Medical Record Number: [id number] Patient Account Number: [id number] Date of Birth/Sex: Apr 29, 1946 (75 y.o. M) Treating Moore: Dolan Amen Primary Care Paula Busenbark: Ria Bush Other Clinician: Referring Trevione Wert: Ria Bush Treating Valeree Leidy/Extender: Tito Dine in Treatment: 3 Clinic Level of Care Assessment Items TOOL 4 Quantity Score X - Use when only an EandM is performed on FOLLOW-UP visit 1 0 ASSESSMENTS - Nursing Assessment / Reassessment X - Reassessment of Co-morbidities (includes updates in patient status) 1 10 X-  1 5 Reassessment of Adherence to Treatment Plan ASSESSMENTS - Wound and Skin Assessment / Reassessment []  - Simple Wound Assessment / Reassessment - one wound 0 []  - 0 Complex Wound Assessment / Reassessment - multiple wounds []  - 0 Dermatologic / Skin Assessment (not related to wound area) ASSESSMENTS - Focused Assessment []  - Circumferential Edema Measurements - multi extremities 0 []  - 0 Nutritional Assessment / Counseling / Intervention []  - 0 Lower Extremity Assessment (monofilament, tuning fork, pulses) []  - 0 Peripheral Arterial Disease Assessment (using hand held doppler) ASSESSMENTS - Ostomy and/or Continence Assessment and Care []  - Incontinence Assessment and Management 0 []  - 0 Ostomy Care Assessment and Management (repouching, etc.) PROCESS - Coordination of Care X - Simple Patient / Family Education for ongoing care 1 15 []  - 0 Complex (extensive) Patient / Family Education for ongoing care []  - 0 Staff obtains Programmer, systems, Records, Test Results / Process Orders []  - 0 Staff telephones HHA, Nursing Homes / Clarify orders / etc []  - 0 Routine Transfer to another Facility (non-emergent condition) []  - 0 Routine Hospital Admission (non-emergent condition) []  - 0 New Admissions / Biomedical engineer / Ordering NPWT, Apligraf, etc. []  - 0 Emergency Hospital Admission (emergent condition) X- 1 10 Simple Discharge Coordination []  - 0 Complex (extensive) Discharge Coordination PROCESS - Special Needs []  - Pediatric / Minor Patient Management 0 []  - 0 Isolation Patient Management []  - 0 Hearing / Language / Visual special needs []  - 0 Assessment of Community assistance (transportation, D/C planning, etc.) []  - 0 Additional assistance / Altered mentation []  - 0 Support Surface(s) Assessment (bed, cushion, seat, etc.) INTERVENTIONS - Wound Cleansing / Measurement [name] ([id number]) []  - 0 Simple Wound Cleansing - one wound []  - 0 Complex Wound  Cleansing - multiple wounds []  - 0 Wound Imaging (photographs - any number of wounds) []  - 0 Wound Tracing (instead of photographs) []  - 0 Simple Wound Measurement -  one wound []  - 0 Complex Wound Measurement - multiple wounds INTERVENTIONS - Wound Dressings []  - Small Wound Dressing one or multiple wounds 0 []  - 0 Medium Wound Dressing one or multiple wounds []  - 0 Large Wound Dressing one or multiple wounds []  - 0 Application of Medications - topical []  - 0 Application of Medications - injection INTERVENTIONS - Miscellaneous []  - External ear exam 0 []  - 0 Specimen Collection (cultures, biopsies, blood, body fluids, etc.) []  - 0 Specimen(s) / Culture(s) sent or taken to Lab for analysis []  - 0 Patient Transfer (multiple staff / Civil Service fast streamer / Similar devices) []  - 0 Simple Staple / Suture removal (25 or less) []  - 0 Complex Staple / Suture removal (26 or more) []  - 0 Hypo / Hyperglycemic Management (close monitor of Blood Glucose) []  - 0 Ankle / Brachial Index (ABI) - do not check if billed separately X- 1 5 Vital Signs Has the patient been seen at the hospital within the last three years: Yes Total Score: 45 Level Of Care: New/Established - Level 2 Electronic Signature(s) Signed: 05/15/2021 1:03:43 PM By: [name] Entered By: Jim Mouse, Minus Breeding on 05/15/2021 10:23:09 [name] ([id number]) -------------------------------------------------------------------------------- Lower Extremity Assessment Details Patient Name: [name] Date of Service: 05/15/2021 10:00 AM Medical Record Number: [id number] Patient Account Number: [id number] Date of Birth/Sex: 12/17/1945 (75 y.o. M) Treating Moore: Donnamarie Poag Primary Care Tareva Leske: Ria Bush Other Clinician: Referring Rotha Cassels: Ria Bush Treating Christianne Zacher/Extender: Ricard Dillon Weeks in Treatment: 3 Edema Assessment Assessed: [Left: Yes] [Right: No] [Left: Edema] [Right:  :] Vascular Assessment Pulses: Dorsalis Pedis Palpable: [Left:Yes] Electronic Signature(s) Signed: 05/15/2021 2:11:15 PM By: Donnamarie Poag Entered By: Donnamarie Poag on 05/15/2021 10:11:25 [name] ([id number]) -------------------------------------------------------------------------------- Multi Wound Chart Details Patient Name: [name] Date of Service: 05/15/2021 10:00 AM Medical Record Number: [id number] Patient Account Number: [id number] Date of Birth/Sex: 03/06/1946 (75 y.o. M) Treating Moore: Dolan Amen Primary Care Anwita Mencer: Ria Bush Other Clinician: Referring Abbagail Scaff: Ria Bush Treating Gizell Danser/Extender: Tito Dine in Treatment: 3 Vital Signs Height(in): 72 Pulse(bpm): 65 Weight(lbs): 644 Blood Pressure(mmHg): 160/90 Body Mass Index(BMI): 29 Temperature(F): 98.1 Respiratory Rate(breaths/min): 20 Photos: [N/A:N/A] Wound Location: Left Toe Great N/A N/A Wounding Event: Gradually Appeared N/A N/A Primary Etiology: Diabetic Wound/Ulcer of the Lower N/A N/A Extremity Comorbid History: Sleep Apnea, Arrhythmia, N/A N/A Hypertension, Type II Diabetes, Osteoarthritis, Neuropathy, Received Radiation Date Acquired: 04/07/2021 N/A N/A Weeks of Treatment: 3 N/A N/A Wound Status: Healed - Epithelialized N/A N/A Measurements L x W x D (cm) 0x0x0 N/A N/A Area (cm) : 0 N/A N/A Volume (cm) : 0 N/A N/A % Reduction in Area: 100.00% N/A N/A % Reduction in Volume: 100.00% N/A N/A Classification: Grade 2 N/A N/A Exudate Amount: None Present N/A N/A Granulation Amount: None Present (0%) N/A N/A Necrotic Amount: None Present (0%) N/A N/A Exposed Structures: Fascia: No N/A N/A Fat Layer (Subcutaneous Tissue): No Tendon: No Muscle: No Joint: No Bone: No Epithelialization: Large (67-100%) N/A N/A Treatment Notes Electronic Signature(s) Signed: 05/15/2021 4:14:58 PM By: Linton Ham MD Entered By: Linton Ham on 05/15/2021  10:24:26 [name] ([id number]) -------------------------------------------------------------------------------- Multi-Disciplinary Care Plan Details Patient Name: [name] Date of Service: 05/15/2021 10:00 AM Medical Record Number: [id number] Patient Account Number: [id number] Date of Birth/Sex: 1946/04/04 (75 y.o. M) Treating Moore: Dolan Amen Primary Care Tiesha Marich: Ria Bush Other Clinician: Referring Cicily Bonano: Ria Bush Treating Salma Walrond/Extender: Tito Dine in Treatment: 3 Active  Inactive Electronic Signature(s) Signed: 05/15/2021 1:03:43 PM By: [name] Entered By: Jim Mouse, Minus Breeding on 05/15/2021 10:22:18 [name] ([id number]) -------------------------------------------------------------------------------- Pain Assessment Details Patient Name: [name] Date of Service: 05/15/2021 10:00 AM Medical Record Number: [id number] Patient Account Number: [id number] Date of Birth/Sex: 1946/03/01 (75 y.o. M) Treating Moore: Donnamarie Poag Primary Care Verity Gilcrest: Ria Bush Other Clinician: Referring Algie Westry: Ria Bush Treating Aashka Salomone/Extender: Tito Dine in Treatment: 3 Active Problems Location of Pain Severity and Description of Pain Patient Has Paino No Site Locations Rate the pain. Current Pain Level: 0 Pain Management and Medication Current Pain Management: Electronic Signature(s) Signed: 05/15/2021 2:11:15 PM By: Donnamarie Poag Entered By: Donnamarie Poag on 05/15/2021 10:07:06 [name] ([id number]) -------------------------------------------------------------------------------- Patient/Caregiver Education Details Patient Name: [name] Date of Service: 05/15/2021 10:00 AM Medical Record Number: [id number] Patient Account Number: [id number] Date of Birth/Gender: 24-Feb-1946 (75 y.o. M) Treating Moore: Dolan Amen Primary Care Physician: Ria Bush Other  Clinician: Referring Physician: Ria Bush Treating Physician/Extender: Tito Dine in Treatment: 3 Education Assessment Education Provided To: Patient Education Topics Provided Notes discharge instructions Electronic Signature(s) Signed: 05/15/2021 1:03:43 PM By: [name] Entered By: Jim Mouse, Minus Breeding on 05/15/2021 10:23:27 [name] ([id number]) -------------------------------------------------------------------------------- Wound Assessment Details Patient Name: [name] Date of Service: 05/15/2021 10:00 AM Medical Record Number: [id number] Patient Account Number: [id number] Date of Birth/Sex: 12-26-1945 (75 y.o. M) Treating Moore: Donnamarie Poag Primary Care Danie Diehl: Ria Bush Other Clinician: Referring Tyquan Carmickle: Ria Bush Treating Jhaniya Briski/Extender: Tito Dine in Treatment: 3 Wound Status Wound Number: 8 Primary Diabetic Wound/Ulcer of the Lower Extremity Etiology: Wound Location: Left Toe Great Wound Healed - Epithelialized Wounding Event: Gradually Appeared Status: Date Acquired: 04/07/2021 Comorbid Sleep Apnea, Arrhythmia, Hypertension, Type II Weeks Of Treatment: 3 History: Diabetes, Osteoarthritis, Neuropathy, Received Radiation Clustered Wound: No Photos Wound Measurements Length: (cm) 0 Width: (cm) 0 Depth: (cm) 0 Area: (cm) 0 Volume: (cm) 0 % Reduction in Area: 100% % Reduction in Volume: 100% Epithelialization: Large (67-100%) Tunneling: No Undermining: No Wound Description Classification: Grade 2 Exudate Amount: None Present Foul Odor After Cleansing: No Slough/Fibrino No Wound Bed Granulation Amount: None Present (0%) Exposed Structure Necrotic Amount: None Present (0%) Fascia Exposed: No Fat Layer (Subcutaneous Tissue) Exposed: No Tendon Exposed: No Muscle Exposed: No Joint Exposed: No Bone Exposed: No Electronic Signature(s) Signed: 05/15/2021 1:03:43 PM By: [name] Signed: 05/15/2021 2:11:15 PM By: Donnamarie Poag Entered By: Jim Mouse, Minus Breeding on 05/15/2021 10:22:00 [name] ([id number]) -------------------------------------------------------------------------------- Vitals Details Patient Name: [name] Date of Service: 05/15/2021 10:00 AM Medical Record Number: [id number] Patient Account Number: [id number] Date of Birth/Sex: Nov 08, 1946 (75 y.o. M) Treating Moore: Donnamarie Poag Primary Care Balbina Depace: Ria Bush Other Clinician: Referring Damaris Geers: Ria Bush Treating Raynette Arras/Extender: Tito Dine in Treatment: 3 Vital Signs Time Taken: 10:07 Temperature (F): 98.1 Height (in): 72 Pulse (bpm): 65 Weight (lbs): 217 Respiratory Rate (breaths/min): 20 Body Mass Index (BMI): 29.4 Blood Pressure (mmHg): 160/90 Reference Range: 80 - 120 mg / dl Electronic Signature(s) Signed: 05/15/2021 2:11:15 PM By: Donnamarie Poag Entered ByDonnamarie Poag on 05/15/2021 10:06:59
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
## 2021-05-15 NOTE — Telephone Encounter (Signed)
Patient has been notified of any changes in his medications that were prescribed by Dr. Rockey Situ since his last office visit. The patient stated, " I am taking all my cardiac medications as prescribed by Dr. Rockey Situ from my last office visit." Mr. Bun stated, "there has been no changes in his medications."  Nothing to update in medications.

## 2021-05-15 NOTE — Patient Instructions (Signed)
-   take 2 tablets (5 mg) tonight, then  - continue warfarin dosage of 1 tablet (2.5 mg) EVERY DAY EXCEPT 1/2 tablet  (1.25 mg) on [place]. -Recheck INR in 2 weeks.

## 2021-05-15 NOTE — Telephone Encounter (Signed)
Patient states not taking several of his medication please call to update.

## 2021-05-16 ENCOUNTER — Other Ambulatory Visit: Payer: Self-pay | Admitting: Family Medicine

## 2021-05-24 ENCOUNTER — Telehealth: Payer: Self-pay | Admitting: Internal Medicine

## 2021-05-24 NOTE — Telephone Encounter (Signed)
Received call from [place] with [employer], who stated that patient would like to switch from [place] to brovana and albuterol for longer acting.   Dr. Mortimer Fries, please advise. Thanks

## 2021-05-28 NOTE — Telephone Encounter (Signed)
Spoke to patient, who stated that he would like to proceed with Breztri.   Lm for Brandi with [employer]

## 2021-05-28 NOTE — Telephone Encounter (Signed)
I have spoken to [name] with [employer] and made her aware of below message.  Nothing further needed.

## 2021-06-04 ENCOUNTER — Encounter: Payer: Medicare Other | Admitting: Family Medicine

## 2021-06-05 ENCOUNTER — Other Ambulatory Visit: Payer: Self-pay

## 2021-06-05 ENCOUNTER — Ambulatory Visit (INDEPENDENT_AMBULATORY_CARE_PROVIDER_SITE_OTHER): Payer: Medicare Other | Admitting: Pharmacist

## 2021-06-05 DIAGNOSIS — I4891 Unspecified atrial fibrillation: Secondary | ICD-10-CM | POA: Diagnosis not present

## 2021-06-05 DIAGNOSIS — Z5181 Encounter for therapeutic drug level monitoring: Secondary | ICD-10-CM | POA: Diagnosis not present

## 2021-06-05 LAB — POCT INR: INR: 1.7 — AB (ref 2.0–3.0)

## 2021-06-05 NOTE — Patient Instructions (Signed)
Description   - take 2 tablets (5 mg) tonight, then  - continue warfarin dosage of 1 tablet (2.5 mg) EVERY DAY EXCEPT 1/2 tablet  (1.25 mg) on [place]. -Recheck INR in 2 weeks.

## 2021-06-06 ENCOUNTER — Other Ambulatory Visit: Payer: Self-pay | Admitting: Family Medicine

## 2021-06-06 DIAGNOSIS — G894 Chronic pain syndrome: Secondary | ICD-10-CM

## 2021-06-06 NOTE — Telephone Encounter (Signed)
Name of Medication: Hydrocodone-APAP Name of Pharmacy: [facility] Last Fill or Written Date and Quantity: 05/10/21, #120 Last Office Visit and Type: 04/19/21, toe ulcer Next Office Visit and Type: 06/14/21, hernia concern Last Controlled Substance Agreement Date: 07/06/17 Last UDS: 02/03/18

## 2021-06-07 MED ORDER — HYDROCODONE-ACETAMINOPHEN 10-325 MG PO TABS: 1.0000 | ORAL_TABLET | Freq: Four times a day (QID) | ORAL | 0 refills | Status: DC | PRN

## 2021-06-07 NOTE — Telephone Encounter (Signed)
ERx 

## 2021-06-14 ENCOUNTER — Encounter: Payer: Self-pay | Admitting: Family Medicine

## 2021-06-14 ENCOUNTER — Other Ambulatory Visit: Payer: Self-pay

## 2021-06-14 ENCOUNTER — Ambulatory Visit (INDEPENDENT_AMBULATORY_CARE_PROVIDER_SITE_OTHER): Payer: Medicare Other | Admitting: Family Medicine

## 2021-06-14 VITALS — BP 132/80 | HR 45 | Temp 97.9°F | Ht 73.0 in | Wt 210.5 lb

## 2021-06-14 DIAGNOSIS — K4090 Unilateral inguinal hernia, without obstruction or gangrene, not specified as recurrent: Secondary | ICD-10-CM

## 2021-06-14 DIAGNOSIS — G894 Chronic pain syndrome: Secondary | ICD-10-CM

## 2021-06-14 DIAGNOSIS — Z7901 Long term (current) use of anticoagulants: Secondary | ICD-10-CM | POA: Diagnosis not present

## 2021-06-14 DIAGNOSIS — I25118 Atherosclerotic heart disease of native coronary artery with other forms of angina pectoris: Secondary | ICD-10-CM | POA: Diagnosis not present

## 2021-06-14 DIAGNOSIS — J449 Chronic obstructive pulmonary disease, unspecified: Secondary | ICD-10-CM

## 2021-06-14 DIAGNOSIS — Z720 Tobacco use: Secondary | ICD-10-CM

## 2021-06-14 DIAGNOSIS — R220 Localized swelling, mass and lump, head: Secondary | ICD-10-CM

## 2021-06-14 DIAGNOSIS — I951 Orthostatic hypotension: Secondary | ICD-10-CM

## 2021-06-14 DIAGNOSIS — G8929 Other chronic pain: Secondary | ICD-10-CM | POA: Diagnosis not present

## 2021-06-14 MED ORDER — CEPHALEXIN 500 MG PO CAPS: 500.0000 mg | ORAL_CAPSULE | Freq: Four times a day (QID) | ORAL | 0 refills | Status: DC

## 2021-06-14 MED ORDER — METHOCARBAMOL 500 MG PO TABS: 500.0000 mg | ORAL_TABLET | Freq: Every day | ORAL | 0 refills | Status: DC

## 2021-06-14 NOTE — Progress Notes (Signed)
Patient ID: [name], male    DOB: Dec 01, 1946, 75 y.o.   MRN: [id number]  This visit was conducted in person.  BP 132/80   Pulse (!) 45   Temp 97.9 F (36.6 C) (Temporal)   Ht 6\' 1"  (1.854 m)   Wt 210 lb 8 oz (95.5 kg)   SpO2 96%   BMI 27.77 kg/m    CC: groin pain, mass on back of head Subjective:   HPI: [name] is a 75 y.o. male presenting on 06/14/2021 for Groin Pain (C/o right side groin pain.  States he has hernia.  Pt accompanied by wife, [name]- temp 97.9.) and Mass (C/o knot on back of head.  Noticed about 1 wk ago.  Area is tender. )   Known h/o R inguinal hernia present for years. Imaging from 2019 showed bladder and appendix containing RIH. Has seen [facility] gen surgery Dr Trellis Moment latest 04/2021 - surgeon did not feel he was a surgery candidate due to comorbidities and current smoking status.  Worried as hernia has enlarged. No pain at hernia, no nausea/vomiting.  Continues using hernia belt as well as jock strap.   Continued 1ppd smoker - does not want to quit.   Also - 1 wk h/o tender bump to R parietal scalp. Denies inciting trauma/injury or fall. No fevers/chills, vomiting.   Requests refill robaxin which he uses 1 at night.      Relevant past medical, surgical, family and social history reviewed and updated as indicated. Interim medical history since our last visit reviewed. Allergies and medications reviewed and updated. Outpatient Medications Prior to Visit  Medication Sig Dispense Refill   albuterol (VENTOLIN HFA) 108 (90 Base) MCG/ACT inhaler Inhale 2 puffs into the lungs every 6 (six) hours as needed for wheezing or shortness of breath. 18 g 6   amiodarone (PACERONE) 200 MG tablet Take 200 mg by mouth daily.     amLODipine (NORVASC) 5 MG tablet Take 5 mg by mouth as needed.     Budeson-Glycopyrrol-Formoterol (BREZTRI AEROSPHERE) 160-9-4.8 MCG/ACT AERO Inhale 2 puffs into the lungs in the morning and at bedtime. 5.9 g 0   Cholecalciferol (VITAMIN D) 50  MCG (2000 UT) CAPS Take 1 capsule (2,000 Units total) by mouth daily. 30 capsule    Coenzyme Q10 (COQ-10 PO) Take 1 tablet by mouth daily.     Cyanocobalamin (VITAMIN B-12) 500 MCG TABS Take 500 mcg by mouth daily.     diphenoxylate-atropine (LOMOTIL) 2.5-0.025 MG tablet Take 1 tablet by mouth 2 (two) times daily as needed. 20 tablet 1   fish oil-omega-3 fatty acids 1000 MG capsule Take 2 g by mouth daily.     furosemide (LASIX) 20 MG tablet TAKE 1 TABLET BY MOUTH EVERY DAY AS NEEDED FOR FLUID RETENTION 90 tablet 0   gabapentin (NEURONTIN) 600 MG tablet Take 0.5-1 tablets (300-600 mg total) by mouth at bedtime as needed (sleep/leg pain). 30 tablet 3   HYDROcodone-acetaminophen (NORCO) 10-325 MG tablet Take 1 tablet by mouth every 6 (six) hours as needed for moderate pain or severe pain. 120 tablet 0   ipratropium-albuterol (DUONEB) 0.5-2.5 (3) MG/3ML SOLN Take 3 mLs by nebulization every 6 (six) hours as needed. 360 mL 11   metFORMIN (GLUCOPHAGE-XR) 500 MG 24 hr tablet TAKE 2 TABLETS(1000 MG) BY MOUTH DAILY WITH BREAKFAST 180 tablet 0   midodrine (PROAMATINE) 10 MG tablet Take 1 tablet (10 mg total) by mouth 3 (three) times daily as needed. 90 tablet  1   nitroGLYCERIN (NITROLINGUAL) 0.4 MG/SPRAY spray USE 1 SPRAY AS DIRECTED EVERY 5 MINUTES AS NEEDED 4.9 g 1   omeprazole (PRILOSEC) 40 MG capsule TAKE 1 CAPSULE(40 MG) BY MOUTH DAILY 90 capsule 0   pravastatin (PRAVACHOL) 20 MG tablet TAKE 1 TABLET(20 MG) BY MOUTH DAILY 90 tablet 3   sulfamethoxazole-trimethoprim (BACTRIM DS) 800-160 MG tablet Take 1 tablet by mouth 2 (two) times daily. 10 tablet 0   warfarin (COUMADIN) 2.5 MG tablet TAKE AS DIRECTED FOR ANTI-COAG CLINIC 135 tablet 1   methocarbamol (ROBAXIN) 750 MG tablet TAKE 1 TABLET(750 MG) BY MOUTH TWICE DAILY 180 tablet 0   No facility-administered medications prior to visit.     Per HPI unless specifically indicated in ROS section below Review of Systems  Objective:  BP 132/80   Pulse  (!) 45   Temp 97.9 F (36.6 C) (Temporal)   Ht 6\' 1"  (1.854 m)   Wt 210 lb 8 oz (95.5 kg)   SpO2 96%   BMI 27.77 kg/m   Wt Readings from Last 3 Encounters:  06/14/21 210 lb 8 oz (95.5 kg)  04/23/21 217 lb 8 oz (98.7 kg)  04/19/21 215 lb 8 oz (97.8 kg)      Physical Exam Vitals and nursing note reviewed.  Constitutional:      Appearance: Normal appearance.  HENT:     Head: Normocephalic and atraumatic.  Abdominal:     Hernia: A hernia is present. Hernia is present in the right inguinal area (large).  Genitourinary:    Testes: Normal.        Right: Mass or tenderness not present.        Left: Mass or tenderness not present.     Comments: Large R inguinal mass predominantly in inguinal area without significant extension into scrotum  Lymphadenopathy:     Lower Body: No right inguinal adenopathy. No left inguinal adenopathy.  Skin:    General: Skin is warm and dry.     Findings: No rash.     Comments: Tender ~1cm nodule to R parietal scalp without fluctuance, small scabbed pore in center of lump  Neurological:     Mental Status: He is alert.      Results for orders placed or performed in visit on 06/05/21  POCT INR  Result Value Ref Range   INR 1.7 (A) 2.0 - 3.0   *Note: Due to a large number of results and/or encounters for the requested time period, some results have not been displayed. A complete set of results can be found in Results Review.   Lab Results  Component Value Date   HGBA1C 6.9 (H) 04/04/2021    Assessment & Plan:  This visit occurred during the SARS-CoV-2 public health emergency.  Safety protocols were in place, including screening questions prior to the visit, additional usage of staff PPE, and extensive cleaning of exam room while observing appropriate contact time as indicated for disinfecting solutions.   Problem List Items Addressed This Visit     Chronic pain syndrome (Chronic)    Requests robaxin muscle relaxant refill which helps him sleep -  he takes 1 tab nightly. Tolerates well without oversedation.        Relevant Medications   methocarbamol (ROBAXIN) 500 MG tablet   Tobacco abuse    Encouraged smoking cessation or at least cutting down to help COPD and improve chances of wound healing.  He has no desire to quit.  COPD mixed type ([place])    Continued smoker, no interest in quitting.  Continues using albuterol rescue inhaler 6-7 times daily regularly. Reviewed detrimental effects of such high abuterol use.  Not using breztri. Reviewed reasons to restart controller medication. They are in the process of completing patient assistance application through pulmonology for this controller medication.        Current use of long term anticoagulation (Coumadin)   Encounter for chronic pain management    [place] CSRS reviewed        Right inguinal hernia - Primary    Known large R inguinal hernia - has been evaluated by gen surg latest at [facility] 04/2021 and deemed high surgical risk however with worsening symptoms, patient would regardless want to proceed with surgery, aware of increased risks. Offered referral for another opinion - they state they have already scheduled second opinion consult with [facility] surgery clinic. Discussed it is ultimately up to surgeon's discretion whether to proceed with surgery or not. Reviewed red flags to seek ER care.        Syncope due to orthostatic hypotension    This has significantly improved since stopping flomax. Unsure if still using midodrine PRN       Mass of scalp    Small indurated lump to right parietal scalp present for the past week, tender especially to any pressure even wearing hat. Anticipate small inflamed cyst - rec warm compresses and oral keflex course. Nothing to drain today, no indication for I&D.          Meds ordered this encounter  Medications   cephALEXin (KEFLEX) 500 MG capsule    Sig: Take 1 capsule (500 mg total) by mouth 4 (four) times daily.    Dispense:   28 capsule    Refill:  0   methocarbamol (ROBAXIN) 500 MG tablet    Sig: Take 1 tablet (500 mg total) by mouth at bedtime.    Dispense:  90 tablet    Refill:  0   No orders of the defined types were placed in this encounter.   Patient Instructions  Keep appointment on Monday with surgeon for second opinion, let me know what they say. If severe hernia pain, nausea/vomiting, seek urgent care.  For spot on scalp - looks like inflamed cyst. Treat with antibiotic sent to pharmacy. Use warm compresses regularly to stop - this will help pain as well.   Follow up plan: No follow-ups on file.  Ria Bush, MD

## 2021-06-14 NOTE — Patient Instructions (Addendum)
Keep appointment on Monday with surgeon for second opinion, let me know what they say. If severe hernia pain, nausea/vomiting, seek urgent care.  For spot on scalp - looks like inflamed cyst. Treat with antibiotic sent to pharmacy. Use warm compresses regularly to stop - this will help pain as well.

## 2021-06-15 DIAGNOSIS — R220 Localized swelling, mass and lump, head: Secondary | ICD-10-CM | POA: Insufficient documentation

## 2021-06-15 NOTE — Assessment & Plan Note (Addendum)
Requests robaxin muscle relaxant refill which helps him sleep - he takes 1 tab nightly. Tolerates well without oversedation.

## 2021-06-15 NOTE — Assessment & Plan Note (Signed)
[place] CSRS reviewed  ?

## 2021-06-15 NOTE — Addendum Note (Signed)
Addended by: Ria Bush on: 06/15/2021 10:15 AM   Modules accepted: Level of Service

## 2021-06-15 NOTE — Assessment & Plan Note (Signed)
Continued smoker, no interest in quitting.  Continues using albuterol rescue inhaler 6-7 times daily regularly. Reviewed detrimental effects of such high abuterol use.  Not using breztri. Reviewed reasons to restart controller medication. They are in the process of completing patient assistance application through pulmonology for this controller medication.

## 2021-06-15 NOTE — Assessment & Plan Note (Addendum)
This has significantly improved since stopping flomax. Unsure if still using midodrine PRN

## 2021-06-15 NOTE — Assessment & Plan Note (Addendum)
Known large R inguinal hernia - has been evaluated by gen surg latest at [facility] 04/2021 and deemed high surgical risk however with worsening symptoms, patient would regardless want to proceed with surgery, aware of increased risks. Offered referral for another opinion - they state they have already scheduled second opinion consult with [facility] surgery clinic. Discussed it is ultimately up to surgeon's discretion whether to proceed with surgery or not. Reviewed red flags to seek ER care.

## 2021-06-15 NOTE — Assessment & Plan Note (Signed)
Small indurated lump to right parietal scalp present for the past week, tender especially to any pressure even wearing hat. Anticipate small inflamed cyst - rec warm compresses and oral keflex course. Nothing to drain today, no indication for I&D.

## 2021-06-15 NOTE — Assessment & Plan Note (Addendum)
Encouraged smoking cessation or at least cutting down to help COPD and improve chances of wound healing.  He has no desire to quit.

## 2021-06-17 ENCOUNTER — Encounter: Payer: Self-pay | Admitting: Family Medicine

## 2021-06-17 DIAGNOSIS — K4030 Unilateral inguinal hernia, with obstruction, without gangrene, not specified as recurrent: Secondary | ICD-10-CM | POA: Diagnosis not present

## 2021-06-17 MED ORDER — GABAPENTIN 600 MG PO TABS: 300.0000 mg | ORAL_TABLET | Freq: Every evening | ORAL | 3 refills | Status: DC | PRN

## 2021-06-18 ENCOUNTER — Telehealth: Payer: Self-pay

## 2021-06-18 IMAGING — DX DG SHOULDER 2+V*R*
3 series · 3 of 3 positions shown · non-contrast
Comparison: 07/25/2019

CLINICAL DATA: Right shoulder pain after fall yesterday.

EXAM:
RIGHT SHOULDER - 2+ VIEW

[shoulder axial]
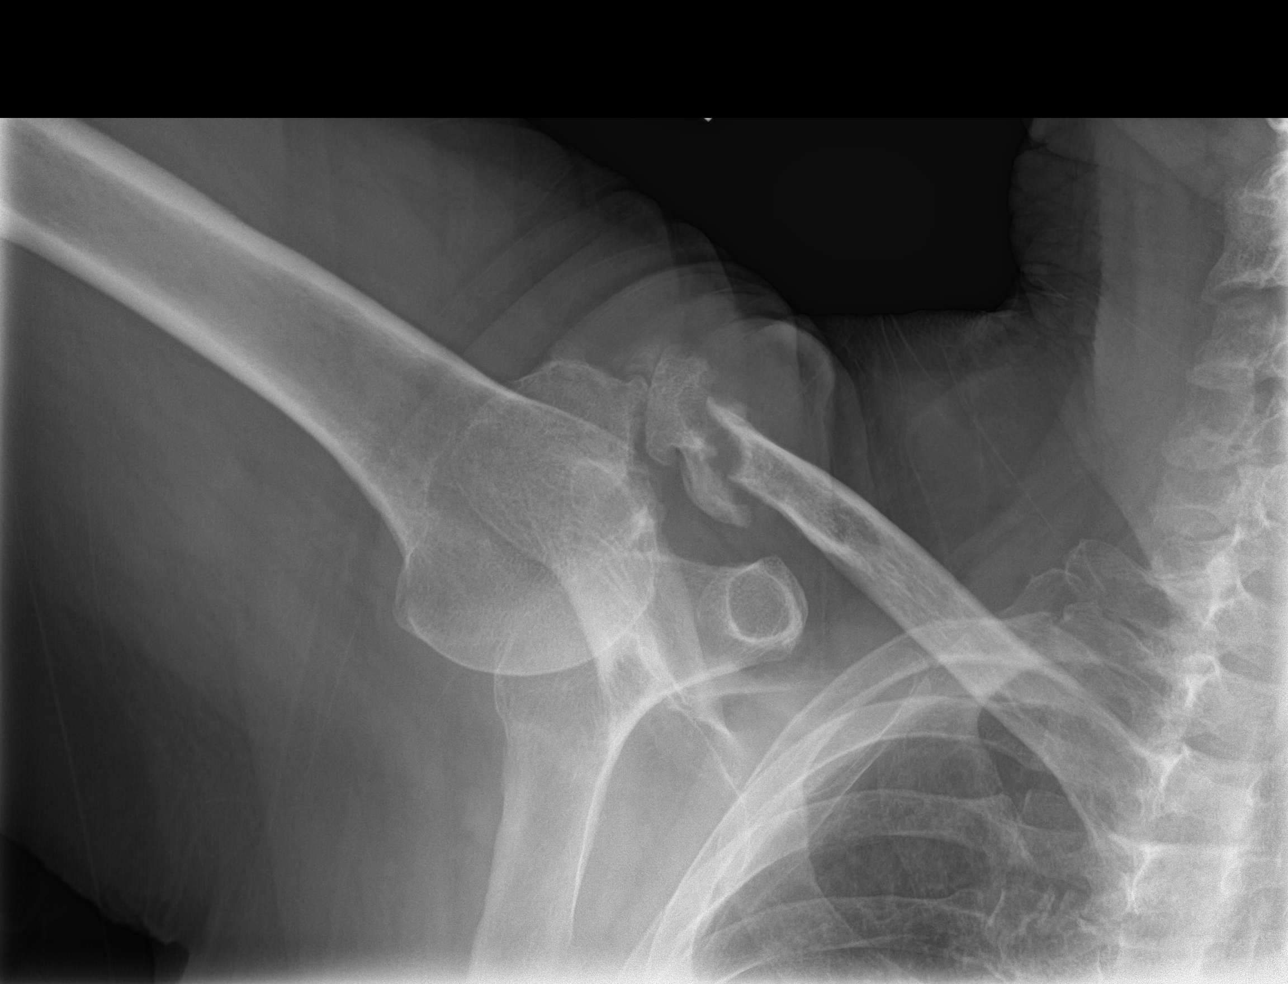

[shoulder obl]
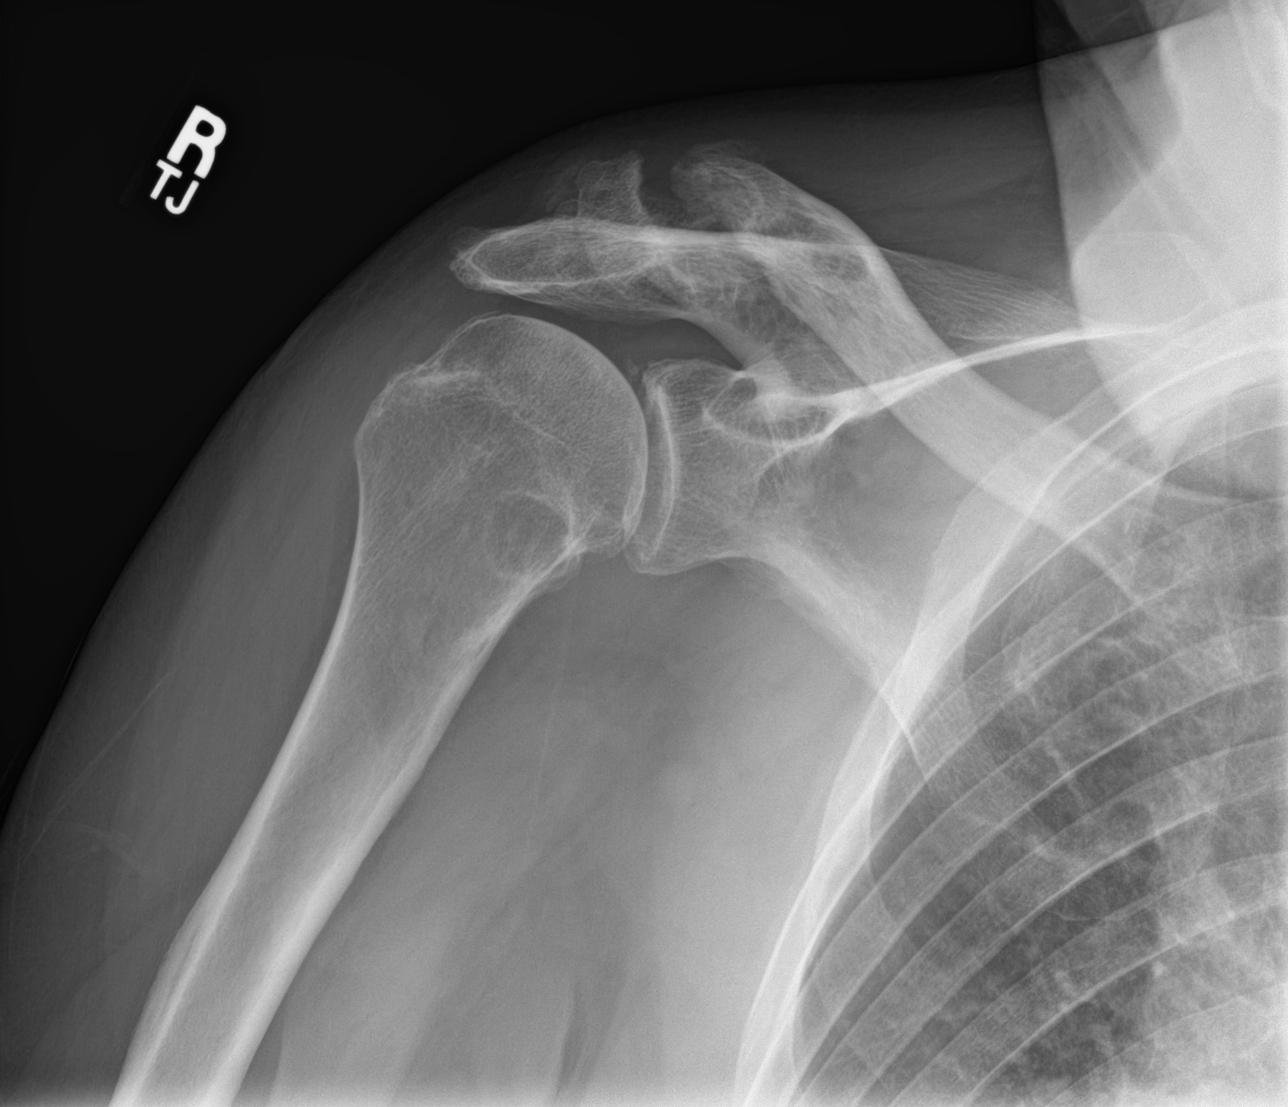

[shoulder y-view]
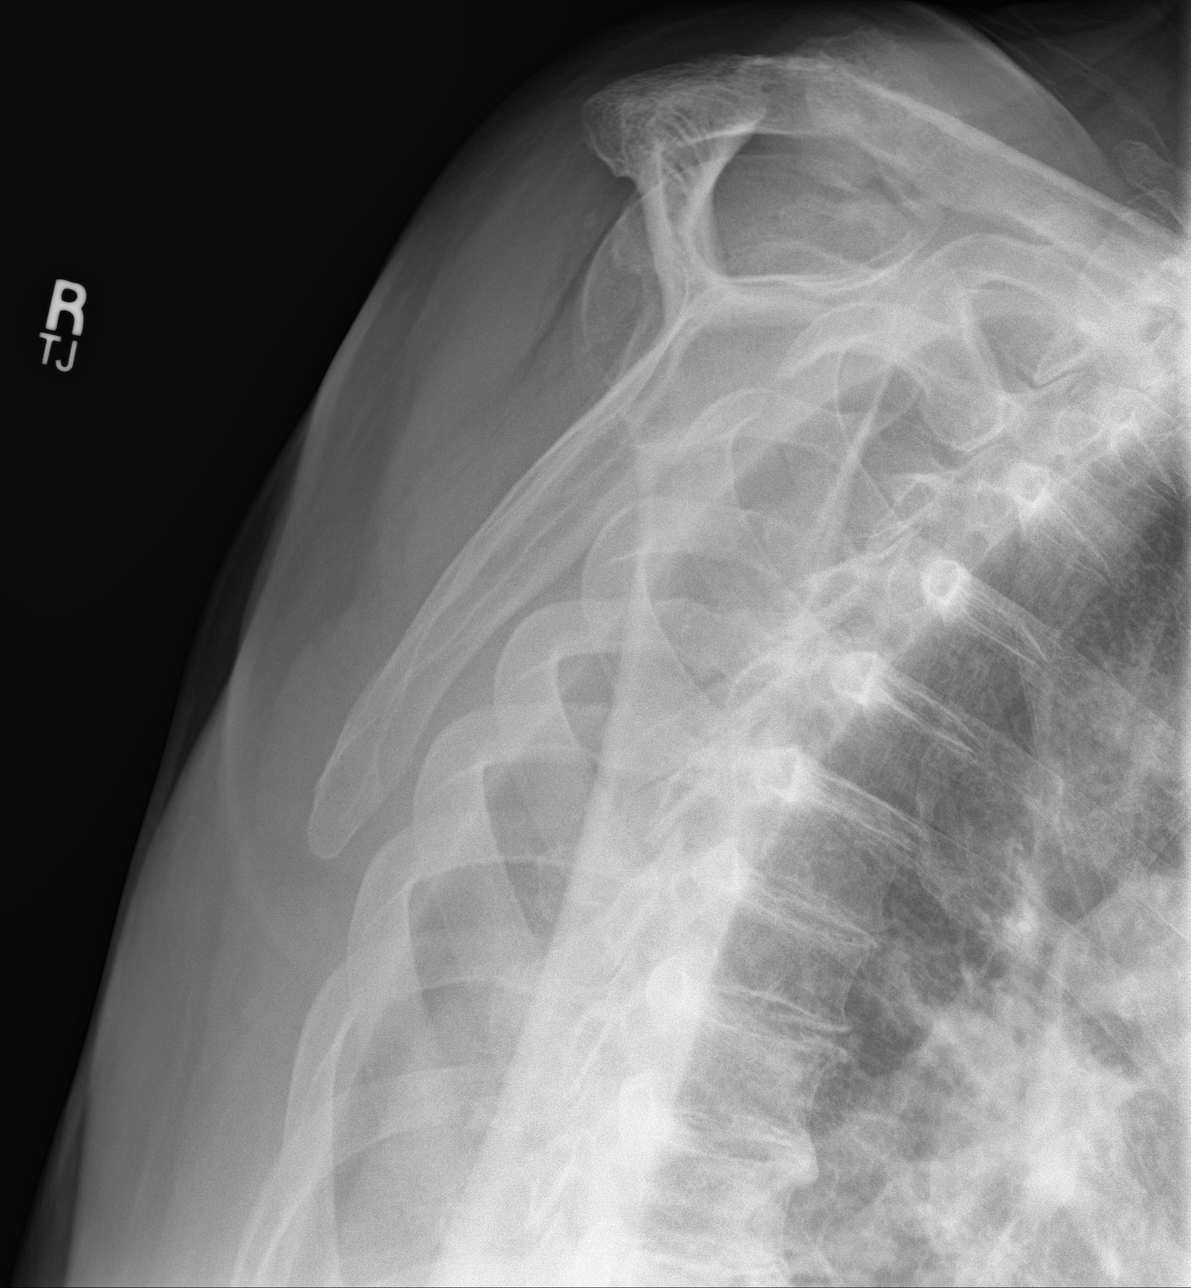

[3 of 3 positions shown; findings below may reference images not displayed]

FINDINGS: Exam demonstrates nonunion of patient's known distal right clavicle
fracture mild degenerate changes of the AC joint and glenohumeral
joints. No acute fractures or dislocation.
IMPRESSION: No acute findings.

Known distal right clavicle fracture with nonunion.

## 2021-06-18 NOTE — Chronic Care Management (AMB) (Addendum)
Chronic Care Management Pharmacy Assistant   Name: [name]  MRN: [id number] DOB: 1946-05-08  Reason for Encounter: CCM Reminder Call   Conditions to be addressed/monitored: CHF, HTN, and COPD  Medications: Outpatient Encounter Medications as of 06/18/2021  Medication Sig   albuterol (VENTOLIN HFA) 108 (90 Base) MCG/ACT inhaler Inhale 2 puffs into the lungs every 6 (six) hours as needed for wheezing or shortness of breath.   amiodarone (PACERONE) 200 MG tablet Take 200 mg by mouth daily.   amLODipine (NORVASC) 5 MG tablet Take 5 mg by mouth as needed.   Budeson-Glycopyrrol-Formoterol (BREZTRI AEROSPHERE) 160-9-4.8 MCG/ACT AERO Inhale 2 puffs into the lungs in the morning and at bedtime.   cephALEXin (KEFLEX) 500 MG capsule Take 1 capsule (500 mg total) by mouth 4 (four) times daily.   Cholecalciferol (VITAMIN D) 50 MCG (2000 UT) CAPS Take 1 capsule (2,000 Units total) by mouth daily.   Coenzyme Q10 (COQ-10 PO) Take 1 tablet by mouth daily.   Cyanocobalamin (VITAMIN B-12) 500 MCG TABS Take 500 mcg by mouth daily.   diphenoxylate-atropine (LOMOTIL) 2.5-0.025 MG tablet Take 1 tablet by mouth 2 (two) times daily as needed.   fish oil-omega-3 fatty acids 1000 MG capsule Take 2 g by mouth daily.   furosemide (LASIX) 20 MG tablet TAKE 1 TABLET BY MOUTH EVERY DAY AS NEEDED FOR FLUID RETENTION   gabapentin (NEURONTIN) 600 MG tablet Take 0.5-1 tablets (300-600 mg total) by mouth at bedtime as needed (sleep/leg pain).   HYDROcodone-acetaminophen (NORCO) 10-325 MG tablet Take 1 tablet by mouth every 6 (six) hours as needed for moderate pain or severe pain.   ipratropium-albuterol (DUONEB) 0.5-2.5 (3) MG/3ML SOLN Take 3 mLs by nebulization every 6 (six) hours as needed.   metFORMIN (GLUCOPHAGE-XR) 500 MG 24 hr tablet TAKE 2 TABLETS(1000 MG) BY MOUTH DAILY WITH BREAKFAST   methocarbamol (ROBAXIN) 500 MG tablet Take 1 tablet (500 mg total) by mouth at bedtime.   midodrine (PROAMATINE) 10 MG  tablet Take 1 tablet (10 mg total) by mouth 3 (three) times daily as needed.   nitroGLYCERIN (NITROLINGUAL) 0.4 MG/SPRAY spray USE 1 SPRAY AS DIRECTED EVERY 5 MINUTES AS NEEDED   omeprazole (PRILOSEC) 40 MG capsule TAKE 1 CAPSULE(40 MG) BY MOUTH DAILY   pravastatin (PRAVACHOL) 20 MG tablet TAKE 1 TABLET(20 MG) BY MOUTH DAILY   sulfamethoxazole-trimethoprim (BACTRIM DS) 800-160 MG tablet Take 1 tablet by mouth 2 (two) times daily.   warfarin (COUMADIN) 2.5 MG tablet TAKE AS DIRECTED FOR ANTI-COAG CLINIC   No facility-administered encounter medications on file as of 06/18/2021.   Patient was contacted to remind him of upcoming telephone visit with [name] on 06/24/2021 at 11:00 AM. He was reminded to have all medications, supplements and any blood glucose and blood pressure readings available for review at appointment. [name], patient's wife, will be speaking on the patients behalf at the appointment per patient's request.  Are you having any problems with your medications? no issues with cost or supply   What concerns would you like to discuss with the pharmacist? doing well at this time and has no immediate concerns with health   Star Rating Drugs: Medication:   Last Fill: Day Supply Metformin XR 500mg   06/06/21 90 Pravastatin 20mg   04/16/21 90   Future appointments: PCP appointment on 09/04/2021, CCM appointment on 06/24/2021, and Cardiology appointment on 06/19/2021   [name], CPP notified  Avel Sensor, [place] Assistant [phone]  I have reviewed the care  management and care coordination activities outlined in this encounter and I am certifying that I agree with the content of this note. No further action required.  [name], PharmD Clinical Pharmacist [facility] [phone]

## 2021-06-19 ENCOUNTER — Other Ambulatory Visit: Payer: Self-pay

## 2021-06-19 ENCOUNTER — Ambulatory Visit (INDEPENDENT_AMBULATORY_CARE_PROVIDER_SITE_OTHER): Payer: Medicare Other

## 2021-06-19 DIAGNOSIS — Z5181 Encounter for therapeutic drug level monitoring: Secondary | ICD-10-CM

## 2021-06-19 DIAGNOSIS — I4891 Unspecified atrial fibrillation: Secondary | ICD-10-CM

## 2021-06-19 LAB — POCT INR: INR: 1.7 — AB (ref 2.0–3.0)

## 2021-06-19 NOTE — Patient Instructions (Signed)
-   take 2 tablets (5 mg) tonight, then  - START NEW warfarin dosage of 1 tablet (2.5 mg) every day -Recheck INR in 2 weeks.

## 2021-06-21 ENCOUNTER — Other Ambulatory Visit: Payer: Self-pay | Admitting: Family Medicine

## 2021-06-24 ENCOUNTER — Ambulatory Visit (INDEPENDENT_AMBULATORY_CARE_PROVIDER_SITE_OTHER): Payer: Medicare Other

## 2021-06-24 ENCOUNTER — Other Ambulatory Visit: Payer: Self-pay

## 2021-06-24 DIAGNOSIS — E1140 Type 2 diabetes mellitus with diabetic neuropathy, unspecified: Secondary | ICD-10-CM

## 2021-06-24 DIAGNOSIS — I10 Essential (primary) hypertension: Secondary | ICD-10-CM
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
## 2021-06-25 ENCOUNTER — Telehealth: Payer: Self-pay | Admitting: Cardiovascular Disease

## 2021-06-25 DIAGNOSIS — K4090 Unilateral inguinal hernia, without obstruction or gangrene, not specified as recurrent: Secondary | ICD-10-CM | POA: Diagnosis not present

## 2021-06-25 NOTE — Telephone Encounter (Signed)
   [facility] Pre-operative Risk Assessment    Patient Name: [name]  DOB: 1946-12-01 MRN: [id number]  HEARTCARE STAFF:  - IMPORTANT!!!!!! Under Visit Info/Reason for Call, type in Other and utilize the format Clearance MM/DD/YY or Clearance TBD. Do not use dashes or single digits. - Please review there is not already an duplicate clearance open for this procedure. - If request is for dental extraction, please clarify the # of teeth to be extracted. - If the patient is currently at the dentist's office, call Pre-Op Callback Staff (MA/nurse) to input urgent request.  - If the patient is not currently in the dentist office, please route to the Pre-Op pool.  Request for surgical clearance:  What type of surgery is being performed? OPEN INGUINAL HERNIA REPAIR  When is this surgery scheduled? TBD  What type of clearance is required (medical clearance vs. Pharmacy clearance to hold med vs. Both)? BOTH  Are there any medications that need to be held prior to surgery and how long? WARFARIN  Practice name and name of physician performing surgery? [facility], DR Michaelle Birks  What is the office phone number? [phone]   7.   What is the office fax number? [phone]  8.   Anesthesia type (None, local, MAC, general) ? GENERAL  Elissa Hefty 06/25/2021, 3:49 PM  _________________________________________________________________   (provider comments below)

## 2021-06-26 NOTE — Telephone Encounter (Addendum)
   Name: [name]  DOB: 1945-12-10  MRN: [id number]   Primary Cardiologist: None  Chart reviewed as part of pre-operative protocol coverage. Pt has open inguinal hernia repair with surgery date TBD.  He has history of persistent A. fib on Coumadin, tachybradycardia syndrome with refusal of pacemaker, diastolic dysfunction, AAA status post endovascular repair at [facility] in 2017, medication nonadherence, prostate cancer with radiation therapy, COPD with ongoing tobacco abuse, poorly controlled diabetes, hyperlipidemia, hypertension, obesity, and obstructive sleep apnea with CPAP. 10/2020 echo EF 50% with mild dilation of the aortic root, measuring 52mm and borderline dilation of the ascending aorta, measuring 32mm.   [name] was last seen on 04/23/21 by Dr. Rockey Situ. It was noted he was not interested in an ischemic workup with pt ruled high risk of underlying ischemia given long smoking history and statin intolerance.  Since that day, [name] has been wearing a monitor with results pending. Previous Zio was reportedly lost in the mail on review of EMR.   On 05/15/21, he called the office and stated he was not taking several of his medications with request that someone call to update him. It is unclear the result of this request, as subsequent documentation states he is taking his medications.   Based on the last office visit note and sx as documented, as well as his past medical history and time since last visit, he will require a follow-up visit in order to better assess preoperative cardiovascular risk.  Pre-op covering staff: - Please schedule appointment and call patient to inform them.  --It appears as if he has an upcoming visit to check his INR. He should keep this, as his INR has been sub-therapeutic for some time. - Please contact requesting surgeon's office via preferred method (i.e, phone, fax) to inform them of need for appointment prior to surgery. I will also route to the  surgeon to provide an update.  For the provider completing his preoperative OV in the future: Pt preoperative request was reviewed by pharmacy. Per office protocol, he may hold warfarin 5 days prior to procedure without need for Lovenox bridge. He should restart warfarin on the evening of the procedure or the day after at the discretion of the surgeon. His last INR was 1.7 and sub-therapeutic. He will require therapeutic INR per Coumadin clinic.   I will close this encounter and remove from preop pool at this time and until the above is completed.  Arvil Chaco, PA-C 06/26/2021, 5:32 PM

## 2021-06-26 NOTE — Telephone Encounter (Signed)
Patient with diagnosis of atrial fibrillation on warfarin for anticoagulation.    Procedure: open inguinal hernia repair Date of procedure: TBD   CHA2DS2-VASc Score = 6  This indicates a 9.7% annual risk of stroke. The patient's score is based upon: CHF History: Yes HTN History: Yes Diabetes History: Yes Stroke History: No Vascular Disease History: Yes Age Score: 2 Gender Score: 0    CrCl 77.7 Platelet count 217  Per office protocol, patient can hold warfarin for 5 days prior to procedure.   Patient will not need bridging with Lovenox (enoxaparin) around procedure.  If not bridging, patient should restart warfarin on the evening of procedure or day after, at discretion of procedure MD

## 2021-06-27 NOTE — Telephone Encounter (Signed)
[name] notified of patients appointment and no further needs at this time.

## 2021-06-27 NOTE — Telephone Encounter (Signed)
Scheduled 7/25 Gollan

## 2021-06-27 NOTE — Telephone Encounter (Signed)
I s/w Jim Bolk, RN who states they will call the pt about an appt for pre op clearance and will place him on a wait list as well. Per Pam, pt prefers to see Dr. Rockey Situ and not an APP. I thanked Pam for her help in this matter.

## 2021-06-27 NOTE — Telephone Encounter (Signed)
Pt has appt 07/01/21 with Dr. Rockey Situ which pre op clearance will be addressed at appt. Will forward notes to MD for upcoming appt. Will send FYI to surgeon's office pt has appt 07/01/21.

## 2021-06-27 NOTE — Telephone Encounter (Signed)
I have reviewed the [place] schedule to see where I may schedule the pt an appt for pre op clearance. Pt has appt with [facility] 07/03/21, though I was unable to make appt with provider same day as [facility]. There are multiple appt's on schedules though they are held appt time slots. First available appt slots not being held are not until 07/31/21. In order to help the pt to the best of my abilities I will ask the [facility] scheduling staff to see if they may be able to assist me in trying to get the pt in for pre op appt.

## 2021-06-27 NOTE — Telephone Encounter (Signed)
Pt is scheduled to see Dr. Rockey Situ 07/01/2021.  Will route back to the requesting surgeon's office to make them aware surgical clearance will be addressed at that time.
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
## 2021-07-01 ENCOUNTER — Other Ambulatory Visit: Payer: Self-pay

## 2021-07-01 ENCOUNTER — Ambulatory Visit (INDEPENDENT_AMBULATORY_CARE_PROVIDER_SITE_OTHER): Payer: Medicare Other | Admitting: Cardiovascular Disease

## 2021-07-01 ENCOUNTER — Encounter: Payer: Self-pay | Admitting: Cardiovascular Disease

## 2021-07-01 VITALS — BP 116/60 | HR 46 | Ht 72.0 in | Wt 205.0 lb

## 2021-07-01 DIAGNOSIS — I4891 Unspecified atrial fibrillation: Secondary | ICD-10-CM

## 2021-07-01 DIAGNOSIS — I10 Essential (primary) hypertension: Secondary | ICD-10-CM

## 2021-07-01 DIAGNOSIS — R55 Syncope and collapse: Secondary | ICD-10-CM | POA: Diagnosis not present

## 2021-07-01 DIAGNOSIS — I77810 Thoracic aortic ectasia: Secondary | ICD-10-CM | POA: Diagnosis not present

## 2021-07-01 DIAGNOSIS — I25118 Atherosclerotic heart disease of native coronary artery with other forms of angina pectoris: Secondary | ICD-10-CM

## 2021-07-01 DIAGNOSIS — E782 Mixed hyperlipidemia: Secondary | ICD-10-CM | POA: Diagnosis not present

## 2021-07-01 DIAGNOSIS — I2721 Secondary pulmonary arterial hypertension: Secondary | ICD-10-CM

## 2021-07-01 DIAGNOSIS — J432 Centrilobular emphysema: Secondary | ICD-10-CM | POA: Diagnosis not present

## 2021-07-01 NOTE — Patient Instructions (Signed)
Medication Instructions:  No changes  If you need a refill on your cardiac medications before your next appointment, please call your pharmacy.    Lab work: No new labs needed   If you have labs (blood work) drawn today and your tests are completely normal, you will receive your results only by: MyChart Message (if you have MyChart) OR A paper copy in the mail If you have any lab test that is abnormal or we need to change your treatment, we will call you to review the results.   Testing/Procedures: No new testing needed   Follow-Up: At [facility], you and your health needs are our priority.  As part of our continuing mission to provide you with exceptional heart care, we have created designated Provider Care Teams.  These Care Teams include your primary Cardiologist (physician) and Advanced Practice Providers (APPs -  Physician Assistants and Nurse Practitioners) who all work together to provide you with the care you need, when you need it.  You will need a follow up appointment in 12 months  Providers on your designated Care Team:   Christopher Berge, NP Ryan Dunn, PA-C Jacquelyn Visser, PA-C Cadence Furth, PA-C  Any Other Special Instructions Will Be Listed Below (If Applicable).  COVID-19 Vaccine Information can be found at: [email or web address] For questions related to vaccine distribution or appointments, please email [email or web address] or call [phone].    

## 2021-07-03 ENCOUNTER — Telehealth: Payer: Self-pay

## 2021-07-03 ENCOUNTER — Other Ambulatory Visit: Payer: Self-pay

## 2021-07-03 ENCOUNTER — Ambulatory Visit (INDEPENDENT_AMBULATORY_CARE_PROVIDER_SITE_OTHER): Payer: Medicare Other

## 2021-07-03 DIAGNOSIS — I4891 Unspecified atrial fibrillation: Secondary | ICD-10-CM

## 2021-07-03 DIAGNOSIS — Z5181 Encounter for therapeutic drug level monitoring: Secondary | ICD-10-CM | POA: Diagnosis not present

## 2021-07-03 LAB — POCT INR: INR: 1.9 — AB (ref 2.0–3.0)

## 2021-07-03 NOTE — Telephone Encounter (Signed)
Spoke w/ pt's wife.   Pt missed appt this am and she is calling to see if I can see pt this pm. Pt r/s for 3:00 today.

## 2021-07-03 NOTE — Telephone Encounter (Signed)
Gayla Doss calling regarding patients warfarin dosage  Please advise

## 2021-07-03 NOTE — Patient Instructions (Signed)
-   START NEW warfarin dosage of 1 tablet (2.5 mg) every day EXCEPT 1.5 TABLETS ON MONDAYS & FRIDAYS. -Recheck INR in 2 weeks.

## 2021-07-04 ENCOUNTER — Other Ambulatory Visit: Payer: Self-pay | Admitting: Family Medicine

## 2021-07-04 ENCOUNTER — Telehealth: Payer: Self-pay

## 2021-07-04 DIAGNOSIS — G894 Chronic pain syndrome: Secondary | ICD-10-CM

## 2021-07-04 NOTE — Telephone Encounter (Signed)
Received surgical clearance letter from [facility].   Patient last seen 02/06/2021. Appt is needed prior to clearance.  Spoke to patient' wife, [name] and offered sooner appt then 08/20/2021 with NP. [name] stated that patient would prefer to see Dr. Patsey Berthold only. Dr. Patsey Berthold does not have sooner availability then 08/20/2021.  Nothing further needed at this time.

## 2021-07-04 NOTE — Telephone Encounter (Signed)
Name of Medication: Hydrocodone-APAP Name of Pharmacy: [facility] Last Fill or Written Date and Quantity: 06/07/21, #120 Last Office Visit and Type: 06/14/21, hernia Next Office Visit and Type: 09/04/21, AWV  Last Controlled Substance Agreement Date: 02/03/18 Last UDS: 07/06/17

## 2021-07-05 NOTE — Patient Instructions (Signed)
Dear [name],  Below is a summary of the goals we discussed during our follow up appointment on June 24, 2021. Please contact me anytime with questions or concerns.   Visit Information  Patient Care Plan: CCM Pharmacy Care PLan     Problem Identified: Disease Management   Priority: High  Onset Date: 07/05/2021  Note:   Current Barriers:  Patient has PRN medications without parameters - amlodipine   Pharmacist Clinical Goal(s):  Patient will contact provider office for questions/concerns as evidenced notation of same in electronic health record through collaboration with PharmD and provider.   Interventions: 1:1 collaboration with Jim Bush, MD regarding development and update of comprehensive plan of care as evidenced by provider attestation and co-signature Inter-disciplinary care team collaboration (see longitudinal plan of care) Comprehensive medication review performed; medication list updated in electronic medical record   Hypertension (BP goal <140/90) -Query Controlled - BP a little elevated this week with hernia - 150s/70s -Current treatment: Amlodipine 5 mg - 1 tablet as needed (not taking unless BP is "high"  SBP > 160) Furosemide 20 mg - 1 tablet daily (not needed lately) Metoprolol tartrate 25 mg - 1 tablet BID (confirms adherence) -Medications previously tried: none reported -Current home readings: reports vitals have been good despite hernia, yesterday 151/74, today 150/72, HR runs 45-50 - he feels good at this heart rate  -Denies hypotensive/hypertensive symptoms -Educated on BP goals and benefits of medications for prevention of heart attack, stroke and kidney damage; - Pt reports SBP goal < 160 after stroke. I believe this was acute stroke management. Due to CV risk, goal is < 130/80 for aggressive risk reduction or 140/90 for less aggressive reduction. Considering orthostatic history, will target 140/90. -Considerations - pt has history of orthostatic  hypotension, prior on midodrine. Not taking currently. Tobacco abuse, not interested in quitting.  -Counseled to monitor BP at home daily, document, and provide log at future appointments. Call if > 140/90 consistently. -Recommended to continue current medication; Please discuss when to take amlodipine with cardiology at upcoming appt. Call if BP > 140/90 consistently.  Diabetes (A1c goal <7%) -Controlled - A1c 6/9% (03/2021) -Current medications: Metformin 500 mg - 2 tablets daily with breakfast -Medications previously tried: none  -Current home glucose readings - checks a couple times a week fasting glucose: 150  post prandial glucose: 150 (2 hours after meal) -Denies hypoglycemic/hyperglycemic symptoms -Educated on A1c and blood sugar goals; -Counseled to check feet daily and get yearly eye exams -Recommended to continue current medication; Schedule annual diabetic foot exam.  Other -  Not taking tamsulosin any longer - he had tachycardia with this. Not taking Breztri right now due to cost. Working with pulmonology on PAPs.  Patient Goals/Self-Care Activities Patient will:  - contact office with questions/concerns  Follow Up Plan: Telephone follow up appointment with care management team member scheduled for: 12 months      Patient verbalizes understanding of instructions provided today and agrees to view in [place].   [name], PharmD Clinical Pharmacist [facility] [phone]

## 2021-07-08 MED ORDER — HYDROCODONE-ACETAMINOPHEN 10-325 MG PO TABS: 1.0000 | ORAL_TABLET | Freq: Four times a day (QID) | ORAL | 0 refills | Status: DC | PRN

## 2021-07-08 NOTE — Telephone Encounter (Signed)
ERx 

## 2021-07-15 ENCOUNTER — Other Ambulatory Visit: Payer: Self-pay | Admitting: Pulmonary Disease

## 2021-07-15 DIAGNOSIS — K4020 Bilateral inguinal hernia, without obstruction or gangrene, not specified as recurrent: Secondary | ICD-10-CM | POA: Diagnosis not present

## 2021-07-15 DIAGNOSIS — F17210 Nicotine dependence, cigarettes, uncomplicated: Secondary | ICD-10-CM | POA: Diagnosis not present

## 2021-07-15 DIAGNOSIS — Z7901 Long term (current) use of anticoagulants: Secondary | ICD-10-CM | POA: Diagnosis not present

## 2021-07-17 ENCOUNTER — Other Ambulatory Visit: Payer: Self-pay

## 2021-07-17 ENCOUNTER — Ambulatory Visit (INDEPENDENT_AMBULATORY_CARE_PROVIDER_SITE_OTHER): Payer: Medicare Other

## 2021-07-17 DIAGNOSIS — Z5181 Encounter for therapeutic drug level monitoring: Secondary | ICD-10-CM | POA: Diagnosis not present

## 2021-07-17 DIAGNOSIS — I4891 Unspecified atrial fibrillation: Secondary | ICD-10-CM

## 2021-07-17 LAB — POCT INR: INR: 1.9 — AB (ref 2.0–3.0)

## 2021-07-17 NOTE — Patient Instructions (Signed)
-   START NEW warfarin dosage of 1 tablet (2.5 mg) every day EXCEPT 1.5 TABLETS ON MONDAYS, [place]. -Recheck INR in 2 weeks.

## 2021-07-21 ENCOUNTER — Other Ambulatory Visit: Payer: Self-pay | Admitting: Family Medicine

## 2021-07-30 ENCOUNTER — Telehealth: Payer: Self-pay

## 2021-07-30 ENCOUNTER — Encounter: Payer: Self-pay | Admitting: Family Medicine

## 2021-07-30 NOTE — Telephone Encounter (Signed)
Patient is aware date/time of covid test. Nothing further needed at this time.

## 2021-07-31 ENCOUNTER — Other Ambulatory Visit: Payer: Medicare Other

## 2021-07-31 ENCOUNTER — Other Ambulatory Visit: Payer: Self-pay

## 2021-07-31 ENCOUNTER — Ambulatory Visit (INDEPENDENT_AMBULATORY_CARE_PROVIDER_SITE_OTHER): Payer: Medicare Other

## 2021-07-31 DIAGNOSIS — Z5181 Encounter for therapeutic drug level monitoring: Secondary | ICD-10-CM | POA: Diagnosis not present

## 2021-07-31 DIAGNOSIS — I4891 Unspecified atrial fibrillation: Secondary | ICD-10-CM

## 2021-07-31 LAB — POCT INR: INR: 1.8 — AB (ref 2.0–3.0)

## 2021-07-31 NOTE — Patient Instructions (Signed)
-   take extra 1/2 tablet when you get home - START NEW warfarin dosage of 1.5 tablets (3.75 mg) every day EXCEPT 1 TABLET ON TUESDAYS AND THURSDAYS. -Recheck INR in 2 weeks.

## 2021-08-02 ENCOUNTER — Other Ambulatory Visit
Admission: RE | Admit: 2021-08-02 | Discharge: 2021-08-02 | Disposition: A | Discharge status: Home / Self Care | Payer: Medicare Other | Source: Ambulatory Visit | Attending: Pulmonary Disease | Admitting: Pulmonary Disease

## 2021-08-02 ENCOUNTER — Other Ambulatory Visit: Payer: Self-pay

## 2021-08-02 DIAGNOSIS — Z01812 Encounter for preprocedural laboratory examination: Secondary | ICD-10-CM | POA: Insufficient documentation

## 2021-08-02 DIAGNOSIS — Z20822 Contact with and (suspected) exposure to covid-19: Secondary | ICD-10-CM | POA: Diagnosis not present

## 2021-08-02 LAB — SARS CORONAVIRUS 2 (TAT 6-24 HRS): SARS Coronavirus 2: NEGATIVE

## 2021-08-05 ENCOUNTER — Ambulatory Visit: Payer: Medicare Other | Attending: Pulmonary Disease

## 2021-08-05 ENCOUNTER — Other Ambulatory Visit: Payer: Self-pay | Admitting: Family Medicine

## 2021-08-05 ENCOUNTER — Other Ambulatory Visit: Payer: Self-pay

## 2021-08-05 ENCOUNTER — Encounter: Payer: Self-pay | Admitting: Family Medicine

## 2021-08-05 DIAGNOSIS — I469 Cardiac arrest, cause unspecified: Secondary | ICD-10-CM | POA: Diagnosis not present

## 2021-08-05 DIAGNOSIS — I272 Pulmonary hypertension, unspecified: Secondary | ICD-10-CM | POA: Diagnosis not present

## 2021-08-05 DIAGNOSIS — R0600 Dyspnea, unspecified: Secondary | ICD-10-CM

## 2021-08-05 DIAGNOSIS — J449 Chronic obstructive pulmonary disease, unspecified: Secondary | ICD-10-CM | POA: Diagnosis not present

## 2021-08-05 DIAGNOSIS — G894 Chronic pain syndrome: Secondary | ICD-10-CM

## 2021-08-05 MED ORDER — ALBUTEROL SULFATE (2.5 MG/3ML) 0.083% IN NEBU
2.5000 mg | INHALATION_SOLUTION | Freq: Once | RESPIRATORY_TRACT | Status: AC
  Administered 2021-08-05: 2.5 mg via RESPIRATORY_TRACT
  Filled 2021-08-05: qty 3

## 2021-08-06 NOTE — Telephone Encounter (Signed)
Last OV - 06/14/2021 Next OV - 09/04/2021 Last Filled - 07/08/2021

## 2021-08-07 ENCOUNTER — Ambulatory Visit (INDEPENDENT_AMBULATORY_CARE_PROVIDER_SITE_OTHER): Payer: Medicare Other

## 2021-08-07 ENCOUNTER — Other Ambulatory Visit: Payer: Self-pay

## 2021-08-07 DIAGNOSIS — I4891 Unspecified atrial fibrillation: Secondary | ICD-10-CM | POA: Diagnosis not present

## 2021-08-07 DIAGNOSIS — Z5181 Encounter for therapeutic drug level monitoring: Secondary | ICD-10-CM

## 2021-08-07 LAB — POCT INR: INR: 2.8 (ref 2.0–3.0)

## 2021-08-07 MED ORDER — HYDROCODONE-ACETAMINOPHEN 10-325 MG PO TABS: 1.0000 | ORAL_TABLET | Freq: Four times a day (QID) | ORAL | 0 refills | Status: DC | PRN

## 2021-08-07 MED ORDER — GABAPENTIN 600 MG PO TABS: 300.0000 mg | ORAL_TABLET | Freq: Three times a day (TID) | ORAL | 3 refills | Status: DC | PRN

## 2021-08-07 NOTE — Telephone Encounter (Signed)
ERx 

## 2021-08-07 NOTE — Patient Instructions (Signed)
-   resume warfarin dosage of 1.5 tablets (3.75 mg) every day EXCEPT 1 TABLET ON TUESDAYS AND THURSDAYS. -Recheck INR in 2 weeks.

## 2021-08-09 ENCOUNTER — Encounter: Payer: Self-pay | Admitting: Family Medicine

## 2021-08-09 ENCOUNTER — Ambulatory Visit (INDEPENDENT_AMBULATORY_CARE_PROVIDER_SITE_OTHER): Payer: Medicare Other | Admitting: Family Medicine

## 2021-08-09 ENCOUNTER — Telehealth: Payer: Self-pay | Admitting: Cardiovascular Disease

## 2021-08-09 ENCOUNTER — Other Ambulatory Visit: Payer: Self-pay

## 2021-08-09 DIAGNOSIS — L03115 Cellulitis of right lower limb: Secondary | ICD-10-CM | POA: Diagnosis not present

## 2021-08-09 DIAGNOSIS — I25118 Atherosclerotic heart disease of native coronary artery with other forms of angina pectoris: Secondary | ICD-10-CM | POA: Diagnosis not present

## 2021-08-09 DIAGNOSIS — L0390 Cellulitis, unspecified: Secondary | ICD-10-CM | POA: Insufficient documentation

## 2021-08-09 DIAGNOSIS — L720 Epidermal cyst: Secondary | ICD-10-CM | POA: Insufficient documentation

## 2021-08-09 MED ORDER — CEPHALEXIN 500 MG PO CAPS: 500.0000 mg | ORAL_CAPSULE | Freq: Two times a day (BID) | ORAL | 0 refills | Status: DC

## 2021-08-09 MED ORDER — SULFAMETHOXAZOLE-TRIMETHOPRIM 800-160 MG PO TABS: 2.0000 | ORAL_TABLET | Freq: Two times a day (BID) | ORAL | 0 refills | Status: AC

## 2021-08-09 MED ORDER — CEFTRIAXONE SODIUM 1 G IJ SOLR
1.0000 g | Freq: Once | INTRAMUSCULAR | Status: AC
  Administered 2021-08-09: 1 g via INTRAMUSCULAR

## 2021-08-09 NOTE — Telephone Encounter (Signed)
Called and spoke w/pt and stated that the bactrim can affect inr per megan's request, I advised the pt to only take 1 tab qd and they voiced understanding and I was able to move their appt up a week

## 2021-08-09 NOTE — Telephone Encounter (Signed)
Keflex is fine but Bactrim has a significant interaction with warfarin and will increase his INR. Ideally prefer to check INR about 3 days after starting Bactrim, but that will be during the holiday weekend. Pt follows in [facility] who only sees patients on Wednesdays.  Please move pt's INR check up from 9/14 to 9/7, next available date. Please also advise him to only take warfarin 1 tablet daily until next INR check as well due to drug interaction.
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
## 2021-08-09 NOTE — Assessment & Plan Note (Addendum)
Started as a papule then became more red and swollen now redness extends about 5 cm above and below (almost to knee) without abscess Will need to tx for mrsa since he has had before  He has taken old abx at home but no idea what  Rocephin 1 g today  Keflex 500 mg tid 10d Bactrim DS 2 pills bid for 10 d  This may affect coumadin and will need to f/u at the coumadin clinic as well  inst to call/go to ER if worse pain or inc redness (or streaking) or swelling  He voiced understanding  F/u planned with pcp next week

## 2021-08-09 NOTE — Patient Instructions (Addendum)
You have a skin infection (cellulitis)  Keep area very clean with soap and water  Elevate your leg if it helps   Shot today (rocephin)  Take oral antibiotics-with food Keflex 500 mg three times daily  Bactrim DS 2 pills twice daily   You can take them with food   If worse/or if redness extends further or swelling worsens over the weekend -go to the ER   Follow up early next week for a re check  Please keep [place] posted

## 2021-08-09 NOTE — Telephone Encounter (Signed)
Routing to pharmd pool

## 2021-08-09 NOTE — Telephone Encounter (Signed)
Patient was prescribed a couple antibiotics  Sulfamethoxazole-Trimethoprim and Cephalexin  Pharmacists states it will affect coumadin levels  Please call to discuss

## 2021-08-13 ENCOUNTER — Ambulatory Visit (INDEPENDENT_AMBULATORY_CARE_PROVIDER_SITE_OTHER): Payer: Medicare Other | Admitting: Family Medicine

## 2021-08-13 ENCOUNTER — Other Ambulatory Visit: Payer: Self-pay

## 2021-08-13 ENCOUNTER — Encounter: Payer: Self-pay | Admitting: Family Medicine

## 2021-08-13 VITALS — BP 120/66 | HR 51 | Temp 98.3°F | Ht 72.0 in | Wt 212.1 lb

## 2021-08-13 DIAGNOSIS — L03115 Cellulitis of right lower limb: Secondary | ICD-10-CM | POA: Diagnosis not present

## 2021-08-13 DIAGNOSIS — R251 Tremor, unspecified: Secondary | ICD-10-CM

## 2021-08-13 DIAGNOSIS — I25118 Atherosclerotic heart disease of native coronary artery with other forms of angina pectoris: Secondary | ICD-10-CM | POA: Diagnosis not present

## 2021-08-13 MED ORDER — CEPHALEXIN 500 MG PO CAPS: 500.0000 mg | ORAL_CAPSULE | Freq: Four times a day (QID) | ORAL | 0 refills | Status: DC

## 2021-08-13 MED ORDER — OMEPRAZOLE 40 MG PO CPDR: 40.0000 mg | DELAYED_RELEASE_CAPSULE | Freq: Every day | ORAL | 3 refills | Status: DC

## 2021-08-13 MED ORDER — METHOCARBAMOL 500 MG PO TABS: 500.0000 mg | ORAL_TABLET | Freq: Every day | ORAL | 1 refills | Status: DC

## 2021-08-13 NOTE — Patient Instructions (Addendum)
Keep coumadin clinic appointment for tomorrow Increase keflex to 4 times daily for the next 5-7 days, to take a total of at least 10 days of antibiotic.  Use warm compresses to the area several times a day.  Schedule wound recheck next week.  New location may be at [place], [place], [place] [address]

## 2021-08-13 NOTE — Assessment & Plan Note (Signed)
Significant improvement since starting antibiotic course.  Now with persistent indurated soft tissue swelling to anterior lower thigh. No fluctuance or drainage, nothing to drain or culture. Rec continue antibiotics as up to now, but will increase keflex to '500mg'$  QID to complete 10 day course (#20 extra sent to pharmacy).  Doubt foreign body. No need for xray at this time.

## 2021-08-13 NOTE — Progress Notes (Signed)
Patient ID: [name], male    DOB: 07-19-1946, 75 y.o.   MRN: [id number]  This visit was conducted in person.  BP 120/66   Pulse (!) 51   Temp 98.3 F (36.8 C) (Temporal)   Ht 6' (1.829 m)   Wt 212 lb 2 oz (96.2 kg)   SpO2 95%   BMI 28.77 kg/m    CC: 1 wk f/u visit cellulitis  Subjective:   HPI: [name] is a 75 y.o. male presenting on 08/13/2021 for Cellulitis (Here for 1 wk f/u of R leg.) and Tremors (Wants to discuss possible rx to help with tremors. )   Saw Dr Glori Bickers last week for leg cellulitis treated with rocephin IM x1, then bactrim and keflex course. He does have coumadin clinic f/u planned for tomorrow, coumadin dose was decreased to 1 tab daily while on bactrim.   Wound started as pimple to R upper leg which he squeezed then used needle to poke into the skin. Advised against doing this. Initial redness, warmth and swelling to leg that has improved with antibiotics.  No fevers/chills, n/v, further drainage.   Also noticing increasing tremors for last few weeks. Asks about pharmacotherapy for this. He currently takes gabapentin '1200mg'$  in am and '600mg'$  in pm for leg pain and to help sleep.      Relevant past medical, surgical, family and social history reviewed and updated as indicated. Interim medical history since our last visit reviewed. Allergies and medications reviewed and updated. Outpatient Medications Prior to Visit  Medication Sig Dispense Refill   albuterol (VENTOLIN HFA) 108 (90 Base) MCG/ACT inhaler INHALE 2 PUFFS INTO THE LUNGS EVERY 6 HOURS AS NEEDED FOR WHEEZING OR SHORTNESS OF BREATH 8.5 g 3   amiodarone (PACERONE) 200 MG tablet Take 200 mg by mouth daily.     amLODipine (NORVASC) 5 MG tablet Take 5 mg by mouth as needed.     Budeson-Glycopyrrol-Formoterol (BREZTRI AEROSPHERE) 160-9-4.8 MCG/ACT AERO Inhale 2 puffs into the lungs in the morning and at bedtime. 5.9 g 0   Cholecalciferol (VITAMIN D) 50 MCG (2000 UT) CAPS Take 1 capsule (2,000 Units  total) by mouth daily. 30 capsule    Coenzyme Q10 (COQ-10 PO) Take 1 tablet by mouth daily.     Cyanocobalamin (VITAMIN B-12) 500 MCG TABS Take 500 mcg by mouth daily.     diphenoxylate-atropine (LOMOTIL) 2.5-0.025 MG tablet Take 1 tablet by mouth 2 (two) times daily as needed. 20 tablet 1   fish oil-omega-3 fatty acids 1000 MG capsule Take 2 g by mouth daily.     furosemide (LASIX) 20 MG tablet TAKE 1 TABLET BY MOUTH EVERY DAY AS NEEDED FOR FLUID RETENTION 90 tablet 0   gabapentin (NEURONTIN) 600 MG tablet Take 0.5-1 tablets (300-600 mg total) by mouth 3 (three) times daily as needed (sleep/leg pain). 200 tablet 3   HYDROcodone-acetaminophen (NORCO) 10-325 MG tablet Take 1 tablet by mouth every 6 (six) hours as needed for moderate pain or severe pain. 120 tablet 0   ipratropium-albuterol (DUONEB) 0.5-2.5 (3) MG/3ML SOLN Take 3 mLs by nebulization every 6 (six) hours as needed. 360 mL 11   metFORMIN (GLUCOPHAGE-XR) 500 MG 24 hr tablet TAKE 2 TABLETS(1000 MG) BY MOUTH DAILY WITH BREAKFAST 180 tablet 0   midodrine (PROAMATINE) 10 MG tablet Take 1 tablet (10 mg total) by mouth 3 (three) times daily as needed. 90 tablet 1   nitroGLYCERIN (NITROLINGUAL) 0.4 MG/SPRAY spray USE 1 SPRAY AS DIRECTED  EVERY 5 MINUTES AS NEEDED 4.9 g 1   pravastatin (PRAVACHOL) 20 MG tablet TAKE 1 TABLET(20 MG) BY MOUTH DAILY 90 tablet 0   sulfamethoxazole-trimethoprim (BACTRIM DS) 800-160 MG tablet Take 2 tablets by mouth 2 (two) times daily for 10 days. 40 tablet 0   warfarin (COUMADIN) 2.5 MG tablet TAKE AS DIRECTED FOR ANTI-COAG CLINIC 135 tablet 1   cephALEXin (KEFLEX) 500 MG capsule Take 1 capsule (500 mg total) by mouth 2 (two) times daily. 20 capsule 0   methocarbamol (ROBAXIN) 500 MG tablet Take 1 tablet (500 mg total) by mouth at bedtime. 90 tablet 0   omeprazole (PRILOSEC) 40 MG capsule TAKE 1 CAPSULE(40 MG) BY MOUTH DAILY 90 capsule 0   No facility-administered medications prior to visit.     Per HPI unless  specifically indicated in ROS section below Review of Systems  Objective:  BP 120/66   Pulse (!) 51   Temp 98.3 F (36.8 C) (Temporal)   Ht 6' (1.829 m)   Wt 212 lb 2 oz (96.2 kg)   SpO2 95%   BMI 28.77 kg/m   Wt Readings from Last 3 Encounters:  08/13/21 212 lb 2 oz (96.2 kg)  08/09/21 209 lb 2 oz (94.9 kg)  07/01/21 205 lb (93 kg)      Physical Exam Vitals and nursing note reviewed.  Constitutional:      Appearance: Normal appearance. He is not ill-appearing.  Musculoskeletal:        General: Normal range of motion.     Comments: FROM at bilateral knees  Skin:    General: Skin is warm and dry.     Findings: Erythema present.          Comments: 4-5 cm diameter indurated erythematous soft tissue swelling with central scab, no fluctuance, no streaking erythema  Neurological:     Mental Status: He is alert.      Results for orders placed or performed in visit on 08/07/21  POCT INR  Result Value Ref Range   INR 2.8 2.0 - 3.0   *Note: Due to a large number of results and/or encounters for the requested time period, some results have not been displayed. A complete set of results can be found in Results Review.   Assessment & Plan:  This visit occurred during the SARS-CoV-2 public health emergency.  Safety protocols were in place, including screening questions prior to the visit, additional usage of staff PPE, and extensive cleaning of exam room while observing appropriate contact time as indicated for disinfecting solutions.   Problem List Items Addressed This Visit     Tremor    Ongoing.  He has been taking gabapentin '1200mg'$ /'600mg'$  daily. Suggested change to '600mg'$  TID for better coverage throughout the day and for hopefully less dry mouth side effect. Will monitor effect of this change on tremors.       Cellulitis of right leg without foot - Primary    Significant improvement since starting antibiotic course.  Now with persistent indurated soft tissue swelling to  anterior lower thigh. No fluctuance or drainage, nothing to drain or culture. Rec continue antibiotics as up to now, but will increase keflex to '500mg'$  QID to complete 10 day course (#20 extra sent to pharmacy).  Doubt foreign body. No need for xray at this time.         Meds ordered this encounter  Medications   methocarbamol (ROBAXIN) 500 MG tablet    Sig: Take 1 tablet (500 mg total)  by mouth at bedtime.    Dispense:  90 tablet    Refill:  1   omeprazole (PRILOSEC) 40 MG capsule    Sig: Take 1 capsule (40 mg total) by mouth daily.    Dispense:  90 capsule    Refill:  3   cephALEXin (KEFLEX) 500 MG capsule    Sig: Take 1 capsule (500 mg total) by mouth 4 (four) times daily.    Dispense:  20 capsule    Refill:  0   No orders of the defined types were placed in this encounter.    Patient Instructions  Keep coumadin clinic appointment for tomorrow Increase keflex to 4 times daily for the next 5-7 days, to take a total of at least 10 days of antibiotic.  Use warm compresses to the area several times a day.  Schedule wound recheck next week.  New location may be at [place], [place], [place] [address]   Follow up plan: Return in about 6 days (around 08/19/2021), or if symptoms worsen or fail to improve, for follow up visit.  Ria Bush, MD

## 2021-08-13 NOTE — Assessment & Plan Note (Signed)
Ongoing.  He has been taking gabapentin '1200mg'$ /'600mg'$  daily. Suggested change to '600mg'$  TID for better coverage throughout the day and for hopefully less dry mouth side effect. Will monitor effect of this change on tremors.

## 2021-08-14 ENCOUNTER — Ambulatory Visit: Payer: Medicare Other | Admitting: Family Medicine

## 2021-08-14 ENCOUNTER — Ambulatory Visit (INDEPENDENT_AMBULATORY_CARE_PROVIDER_SITE_OTHER): Payer: Medicare Other

## 2021-08-14 DIAGNOSIS — Z5181 Encounter for therapeutic drug level monitoring: Secondary | ICD-10-CM

## 2021-08-14 DIAGNOSIS — I4891 Unspecified atrial fibrillation: Secondary | ICD-10-CM

## 2021-08-14 LAB — POCT INR: INR: 3.1 — AB (ref 2.0–3.0)

## 2021-08-14 NOTE — Patient Instructions (Signed)
-   since you have already taken your warfarin today, take a 1/2 tablet warfarin tomorrow, then - since you are you an antibiotic, stay on 1 tablet every day - Recheck INR next week

## 2021-08-20 ENCOUNTER — Encounter: Payer: Self-pay | Admitting: Pulmonary Disease

## 2021-08-20 ENCOUNTER — Ambulatory Visit (INDEPENDENT_AMBULATORY_CARE_PROVIDER_SITE_OTHER): Payer: Medicare Other | Admitting: Pulmonary Disease

## 2021-08-20 ENCOUNTER — Ambulatory Visit (INDEPENDENT_AMBULATORY_CARE_PROVIDER_SITE_OTHER): Payer: Medicare Other | Admitting: Family Medicine

## 2021-08-20 ENCOUNTER — Other Ambulatory Visit: Payer: Self-pay

## 2021-08-20 ENCOUNTER — Encounter: Payer: Self-pay | Admitting: Family Medicine

## 2021-08-20 VITALS — BP 120/74 | HR 57 | Temp 97.7°F | Ht 72.0 in | Wt 216.4 lb

## 2021-08-20 VITALS — BP 118/72 | HR 64 | Temp 98.9°F | Ht 72.0 in | Wt 215.4 lb

## 2021-08-20 DIAGNOSIS — Z7409 Other reduced mobility: Secondary | ICD-10-CM

## 2021-08-20 DIAGNOSIS — I495 Sick sinus syndrome: Secondary | ICD-10-CM

## 2021-08-20 DIAGNOSIS — I25118 Atherosclerotic heart disease of native coronary artery with other forms of angina pectoris: Secondary | ICD-10-CM

## 2021-08-20 DIAGNOSIS — Z9119 Patient's noncompliance with other medical treatment and regimen: Secondary | ICD-10-CM

## 2021-08-20 DIAGNOSIS — L03115 Cellulitis of right lower limb: Secondary | ICD-10-CM

## 2021-08-20 DIAGNOSIS — Z7901 Long term (current) use of anticoagulants: Secondary | ICD-10-CM | POA: Diagnosis not present

## 2021-08-20 DIAGNOSIS — Z91199 Patient's noncompliance with other medical treatment and regimen due to unspecified reason: Secondary | ICD-10-CM

## 2021-08-20 DIAGNOSIS — F17210 Nicotine dependence, cigarettes, uncomplicated: Secondary | ICD-10-CM

## 2021-08-20 DIAGNOSIS — J449 Chronic obstructive pulmonary disease, unspecified: Secondary | ICD-10-CM

## 2021-08-20 DIAGNOSIS — K4090 Unilateral inguinal hernia, without obstruction or gangrene, not specified as recurrent: Secondary | ICD-10-CM | POA: Diagnosis not present

## 2021-08-20 DIAGNOSIS — J9611 Chronic respiratory failure with hypoxia: Secondary | ICD-10-CM

## 2021-08-20 MED ORDER — BREZTRI AEROSPHERE 160-9-4.8 MCG/ACT IN AERO: 2.0000 | INHALATION_SPRAY | Freq: Two times a day (BID) | RESPIRATORY_TRACT | 0 refills | Status: DC

## 2021-08-20 MED ORDER — DOXYCYCLINE HYCLATE 100 MG PO TABS: 100.0000 mg | ORAL_TABLET | Freq: Two times a day (BID) | ORAL | 0 refills | Status: DC

## 2021-08-20 NOTE — Patient Instructions (Addendum)
Continue warm compresses as often as able Refilled doxycycline course another 5 days. Return in 1 week for follow.

## 2021-08-20 NOTE — Assessment & Plan Note (Signed)
Caution with INR while extending doxycycline course - he states he has coumadin clinic later this week.

## 2021-08-20 NOTE — Progress Notes (Addendum)
Patient ID: [name], male    DOB: [birth year], 75 y.o.   MRN: [id number]  This visit was conducted in person.  BP 118/72   Pulse 64   Temp 98.9 F (37.2 C) (Temporal)   Ht 6' (1.829 m)   Wt 215 lb 6 oz (97.7 kg)   SpO2 98%   BMI 29.21 kg/m    CC: 1 wk cellulitis f/u visit  Subjective:   HPI: [name] is a 75 y.o. male presenting on 08/20/2021 for Cellulitis (Here for 1 wk f/u.)   See prior note for details.  Ongoing cellulitis treated with rocephin IM x1 then bactrim + keflex course.  No fevers,chills, nausea, streaking redness.   Wants to have inguinal hernia repaired. Saw [facility] surgery as well as [facility] - upcoming f/u appt with both.   Lab Results  Component Value Date   INR 1.8 (A) 08/21/2021   INR 3.1 (A) 08/14/2021   INR 2.8 08/07/2021    Saw pulm Dr Patsey Berthold today - he is high risk for surgery from pulm standpoint.  Did receive cards clearance.      Relevant past medical, surgical, family and social history reviewed and updated as indicated. Interim medical history since our last visit reviewed. Allergies and medications reviewed and updated. Outpatient Medications Prior to Visit  Medication Sig Dispense Refill   albuterol (VENTOLIN HFA) 108 (90 Base) MCG/ACT inhaler INHALE 2 PUFFS INTO THE LUNGS EVERY 6 HOURS AS NEEDED FOR WHEEZING OR SHORTNESS OF BREATH 8.5 g 3   amiodarone (PACERONE) 200 MG tablet Take 200 mg by mouth daily.     amLODipine (NORVASC) 5 MG tablet Take 5 mg by mouth as needed.     Budeson-Glycopyrrol-Formoterol (BREZTRI AEROSPHERE) 160-9-4.8 MCG/ACT AERO Inhale 2 puffs into the lungs in the morning and at bedtime. 5.9 g 0   Budeson-Glycopyrrol-Formoterol (BREZTRI AEROSPHERE) 160-9-4.8 MCG/ACT AERO Inhale 2 puffs into the lungs in the morning and at bedtime. 10.7 g 0   Cholecalciferol (VITAMIN D) 50 MCG (2000 UT) CAPS Take 1 capsule (2,000 Units total) by mouth daily. 30 capsule    Coenzyme Q10 (COQ-10 PO) Take 1 tablet by  mouth daily.     Cyanocobalamin (VITAMIN B-12) 500 MCG TABS Take 500 mcg by mouth daily.     diphenoxylate-atropine (LOMOTIL) 2.5-0.025 MG tablet Take 1 tablet by mouth 2 (two) times daily as needed. 20 tablet 1   fish oil-omega-3 fatty acids 1000 MG capsule Take 2 g by mouth daily.     furosemide (LASIX) 20 MG tablet TAKE 1 TABLET BY MOUTH EVERY DAY AS NEEDED FOR FLUID RETENTION 90 tablet 0   gabapentin (NEURONTIN) 600 MG tablet Take 0.5-1 tablets (300-600 mg total) by mouth 3 (three) times daily as needed (sleep/leg pain). 200 tablet 3   HYDROcodone-acetaminophen (NORCO) 10-325 MG tablet Take 1 tablet by mouth every 6 (six) hours as needed for moderate pain or severe pain. 120 tablet 0   ipratropium-albuterol (DUONEB) 0.5-2.5 (3) MG/3ML SOLN Take 3 mLs by nebulization every 6 (six) hours as needed. 360 mL 11   metFORMIN (GLUCOPHAGE-XR) 500 MG 24 hr tablet TAKE 2 TABLETS(1000 MG) BY MOUTH DAILY WITH BREAKFAST 180 tablet 0   methocarbamol (ROBAXIN) 500 MG tablet Take 1 tablet (500 mg total) by mouth at bedtime. 90 tablet 1   midodrine (PROAMATINE) 10 MG tablet Take 1 tablet (10 mg total) by mouth 3 (three) times daily as needed. 90 tablet 1   nitroGLYCERIN (NITROLINGUAL)  0.4 MG/SPRAY spray USE 1 SPRAY AS DIRECTED EVERY 5 MINUTES AS NEEDED 4.9 g 1   omeprazole (PRILOSEC) 40 MG capsule Take 1 capsule (40 mg total) by mouth daily. 90 capsule 3   pravastatin (PRAVACHOL) 20 MG tablet TAKE 1 TABLET(20 MG) BY MOUTH DAILY 90 tablet 0   warfarin (COUMADIN) 2.5 MG tablet TAKE AS DIRECTED FOR ANTI-COAG CLINIC 135 tablet 1   cephALEXin (KEFLEX) 500 MG capsule Take 1 capsule (500 mg total) by mouth 4 (four) times daily. 20 capsule 0   No facility-administered medications prior to visit.     Per HPI unless specifically indicated in ROS section below Review of Systems  Objective:  BP 118/72   Pulse 64   Temp 98.9 F (37.2 C) (Temporal)   Ht 6' (1.829 m)   Wt 215 lb 6 oz (97.7 kg)   SpO2 98%   BMI  29.21 kg/m   Wt Readings from Last 3 Encounters:  08/20/21 215 lb 6 oz (97.7 kg)  08/20/21 216 lb 6.4 oz (98.2 kg)  08/13/21 212 lb 2 oz (96.2 kg)      Physical Exam Vitals and nursing note reviewed.  Constitutional:      Appearance: Normal appearance.  Abdominal:     Hernia: A hernia is present. Hernia is present in the right inguinal area (large, with extension into scrotum).  Skin:    General: Skin is warm and dry.     Findings: Lesion present. No erythema.          Comments: Indurated ~3cm diameter lesion to R anterior lower thigh decreased in size from prior. No fluctuance appreciated.   Neurological:     Mental Status: He is alert.      Results for orders placed or performed in visit on 08/14/21  POCT INR  Result Value Ref Range   INR 3.1 (A) 2.0 - 3.0   *Note: Due to a large number of results and/or encounters for the requested time period, some results have not been displayed. A complete set of results can be found in Results Review.   Lab Results  Component Value Date   HGBA1C 6.9 (H) 04/04/2021    Assessment & Plan:  This visit occurred during the SARS-CoV-2 public health emergency.  Safety protocols were in place, including screening questions prior to the visit, additional usage of staff PPE, and extensive cleaning of exam room while observing appropriate contact time as indicated for disinfecting solutions.   Problem List Items Addressed This Visit     Current use of long term anticoagulation (Coumadin)    Caution with INR while extending doxycycline course - he states he has coumadin clinic later this week.       Right inguinal hernia    Large, extending into scrotum.  Has f/u planned with gen surgery (both at [place]), deciding on where to get hernia repaired.       Cellulitis of right leg without foot - Primary    Continued improvement noted. Small central erosion to wound, no significant drainage with expression. Continue home care, rec warm  compresses, refill doxycycline 5d course. Update if not improving with treatment.       Impaired mobility    He continues to use power mobility device which facilitates mobility around the house as well as outside of the house.  This helps prevent falls (several this past year).         Meds ordered this encounter  Medications   DISCONTD:  doxycycline (VIBRA-TABS) 100 MG tablet    Sig: Take 1 tablet (100 mg total) by mouth 2 (two) times daily.    Dispense:  10 tablet    Refill:  0    No orders of the defined types were placed in this encounter.    Patient Instructions  Continue warm compresses as often as able Refilled doxycycline course another 5 days. Return in 1 week for follow.   Follow up plan: Return in about 1 week (around 08/27/2021) for follow up visit.  Ria Bush, MD

## 2021-08-20 NOTE — Patient Instructions (Signed)
We will have the oxygen company provide a water bottle to attach to your concentrator to put distilled water in it this will help with the humidity when you use the oxygen at nighttime.  We have given you some samples of Breztri.  We will see him in follow-up in 6 months time call sooner should any new problems arise.

## 2021-08-20 NOTE — Assessment & Plan Note (Addendum)
Continued improvement noted. Small central erosion to wound, no significant drainage with expression. Continue home care, rec warm compresses, refill doxycycline 5d course. Update if not improving with treatment.

## 2021-08-20 NOTE — Progress Notes (Signed)
Subjective:    Patient ID: [name], male    DOB: 1946/03/31, 75 y.o.   MRN: [id number]  Patient Care Team: Jim Boyden, MD as PCP - General (Family Medicine) Jim Iba, MD (Cardiology) Jim Finch, MD as Attending Physician (Vascular Surgery) Jim Miller, MD as Radiation Oncologist (Radiation Oncology) [name], [facility] (Inactive) as Pharmacist (Pharmacist) [facility]  Chief Complaint  Patient presents with   Follow-up    No current sx. Breathing is doing well after starting oxygen.     HPI [name] is a 75 year old very complex current smoker (1 PPD) with a history as noted below, who follows for the issue of shortness of breath.  He has been evaluated previously by Dr. Jetty Duhamel noted to have significant sleep apnea but refused to have CPAP.  Subsequently a repeat study was attempted however the patient got nauseated and left the lab without proceeding with the study.  He has issues with paroxysmal atrial fibrillation and bradycardia.  He takes amiodarone basically "at will" and not following cardiology recommendations.  He likely has sick sinus syndrome but has declined pacemaker in the past.  He has not qualified for ambulatory oxygen supplementation however did qualify for nocturnal oxygen supplementation but even with nocturnal supplementation he does not use it except when he feels he needs it.  He cannot tell me how he knows he needs it.  I have advised him that desaturations that happened during his sleep he will not be able to notice.  He continues to smoke 1 pack of cigarettes per day AGAINST MEDICAL ADVICE.  Previously we had provided him with Jerrye Bushy which he found was helpful.  He continues to "ration" the [place] and uses albuterol as a rescue.   During his prior visit he was qualified for oxygen with exertion at 2 L/min.  Review of Systems A 10 point review of systems was performed and it is as noted  above otherwise negative.  Patient Active Problem List   Diagnosis Date Noted   Jerking 04/02/2022   Tick bite of buttock 04/02/2022   Clostridioides difficile infection 02/06/2022   Postprocedural intraabdominal abscess 02/01/2022   Wound of skin 11/28/2021   History of right inguinal hernia repair 11/04/2021   Impaired mobility 08/26/2021   Epidermal cyst 08/09/2021   Mass of scalp 06/15/2021   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 mellitus, with fat layer exposed (HCC) 04/19/2021   Tremor 03/13/2021   Right leg pain 01/21/2021   Syncope due to orthostatic hypotension 01/03/2021   Rib pain 01/03/2021   Skin lesion 09/06/2020   Skin rash 06/07/2020   Peripheral neuropathy 05/21/2020   Medicare annual wellness visit, subsequent 05/02/2020   Encounter for power mobility device assessment 05/01/2020   General unsteadiness 05/01/2020   Burn of left lower leg 04/16/2020   Nocturia 04/22/2019   Chronic left shoulder pain 02/08/2019   Fatty pancreas 06/01/2018   Right inguinal hernia 01/27/2018   Thoracic aortic atherosclerosis (HCC) 01/23/2018   Atherosclerotic heart disease of native coronary artery with other forms of angina pectoris (HCC) 01/23/2018   PAH (pulmonary artery hypertension) (HCC) 01/23/2018   Advanced care planning/counseling discussion 01/09/2018   Prostate cancer (HCC) 12/18/2017   Vitamin B12 deficiency 12/16/2017   Vitamin D insufficiency 12/16/2017   Osteoarthritis 12/14/2017   Bilateral shoulder pain 12/14/2017   Iron deficiency anemia 12/08/2017   Sick sinus syndrome (HCC) 10/09/2017  Opiate dependence (HCC) 09/07/2017   CHF (congestive heart failure) (HCC) 07/24/2017   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   Other bursal cyst, left hand 01/23/2017   Recent head trauma 01/23/2017   Encounter for chronic pain management 12/09/2016   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   Current use of long term  anticoagulation (Coumadin) 06/30/2016   Chronic pain syndrome 03/07/2016   Chronic low back pain (Fourth Area of Pain) (Bilateral) 12/18/2015   Unexplained weight loss 06/08/2015   Abnormal drug screen 06/08/2015   Diarrhea 04/24/2013   DDD (degenerative disc disease), cervical 04/03/2012   OSA (obstructive sleep apnea) 03/08/2012   AAA (abdominal aortic aneurysm) without rupture (HCC) 10/22/2011   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/20/2011   Type 2 diabetes mellitus with diabetic neuropathy (HCC) 02/27/2011   Diabetic polyneuropathy (HCC) 02/27/2011   Dyspnea on exertion 12/30/2010   Personal history of noncompliance with medical treatment, presenting hazards to health 11/28/2010   HTN (hypertension) 10/17/2010   Hyperlipidemia 09/19/2010   ABNORMAL ELECTROCARDIOGRAM 09/19/2010   Atrial fibrillation (HCC) 09/18/2010   Esophageal reflux 04/21/2010   LEG CRAMPS, IDIOPATHIC 04/21/2010   Fatty liver 04/21/2010   Tobacco abuse 03/28/2010   HEMATURIA, HX OF 03/28/2010   COPD mixed type (HCC) 03/21/2010   Social History   Tobacco Use   Smoking status: Every Day    Current packs/day: 0.00    Average packs/day: 2.5 packs/day for 65.0 years (162.5 ttl pk-yrs)    Types: Cigarettes    Start date: 10/28/1956    Last attempt to quit: 10/28/2021    Years since quitting: 1.7   Smokeless tobacco: Never   Tobacco comments:    1ppd 08/20/2021  Substance Use Topics   Alcohol use: Not Currently    Comment: occassionally   Allergies  Allergen Reactions   Oxycodone Hcl Shortness Of Breath   Glipizide Diarrhea   Sitagliptin Diarrhea   Varenicline Tartrate Other (See Comments)    REACTION: hallucinations, but on retrial did well   Wellbutrin [Bupropion Hcl] Other (See Comments)    Hallucinations   Zocor [Simvastatin] Other (See Comments)    Muscle pain   Medications reviewed with the patient.  Immunization History  Administered Date(s) Administered   PFIZER(Purple  Top)SARS-COV-2 Vaccination 03/06/2020, 03/27/2020, 10/05/2020   Td 04/30/2020      Objective:   Physical Exam BP 120/74 (BP Location: Left Arm, Cuff Size: Normal)   Pulse (!) 57   Temp 97.7 F (36.5 C) (Temporal)   Ht 6' (1.829 m)   Wt 216 lb 6.4 oz (98.2 kg)   SpO2 93%   BMI 29.35 kg/m   SpO2: 93 % O2 Device: None (Room air)  GENERAL: Well-developed obese gentleman, in no overt respiratory distress, chronic use of accessories.  Chronically ill-appearing.  Presents in transport chair. HEAD: Normocephalic, atraumatic.  EYES: Pupils equal, round, reactive to light.  No scleral icterus.  MOUTH: Nose/mouth/throat not examined due to masking requirements for COVID 19. NECK: Supple. No thyromegaly. Trachea midline. No JVD.  No adenopathy. PULMONARY: Good air entry bilaterally.  Diffuse rhonchi and end expiratory wheezes throughout.  CARDIOVASCULAR: S1 and S2.   Bradycardic, appears to be in sinus rhythm today.  No rubs, murmurs or gallops heard. ABDOMEN: Benign. MUSCULOSKELETAL: No joint deformity, no clubbing, no edema.  NEUROLOGIC: Unsteady gait, needs assistance from person/cane.  No other focal deficits. Speech is fluent. SKIN: Intact,warm,dry.  Nicotine stained fingers right hand. PSYCH: Cantankerous and ill tempered.  Confrontational with examiner.  Assessment & Plan:     ICD-10-CM   1. COPD, severe (HCC)  J44.9 AMB REFERRAL FOR DME   Continue Breztri Continue as needed albuterol    2. Chronic respiratory failure with hypoxia (HCC)  J96.11 AMB REFERRAL FOR DME   Continue oxygen at 2 L/min with exertion and during sleep Humidifier bottle for oxygen concentrator    3. Noncompliance - morbid, presents hazards to health  Z91.19    Patient takes medications at will    4. Sick sinus syndrome [facility])  I49.5    Patient has declined pacer and ablation in the past Follows with cardiology    5. Tobacco dependence due to cigarettes  F17.210    Patient counseled regards to  discontinuation of smoking Counseling time 3-5 minutes     Orders Placed This Encounter  Procedures   AMB REFERRAL FOR DME    Referral Priority:   Routine    Number of Visits Requested:   1   Meds ordered this encounter  Medications   Budeson-Glycopyrrol-Formoterol (BREZTRI AEROSPHERE) 160-9-4.8 MCG/ACT AERO    Sig: Inhale 2 puffs into the lungs in the morning and at bedtime.    Dispense:  10.7 g    Refill:  0    Order Specific Question:   Lot Number?    Answer:   [id number]    Order Specific Question:   Expiration Date?    Answer:   03/08/2024    Order Specific Question:   Manufacturer?    Answer:   [employer] [[id number]]    Order Specific Question:   Quantity    Answer:   2   Will see the patient in follow-up in 6 months time he is to contact us prior to that time should any new difficulties arise.  Gailen Shelter, MD Advanced Bronchoscopy PCCM [facility]    *This note was dictated using voice recognition software/Dragon.  Despite best efforts to proofread, errors can occur which can change the meaning. Any transcriptional errors that result from this process are unintentional and may not be fully corrected at the time of dictation.

## 2021-08-20 NOTE — Assessment & Plan Note (Signed)
Large, extending into scrotum.  Has f/u planned with gen surgery (both at [place]), deciding on where to get hernia repaired.

## 2021-08-21 ENCOUNTER — Ambulatory Visit (INDEPENDENT_AMBULATORY_CARE_PROVIDER_SITE_OTHER): Payer: Medicare Other

## 2021-08-21 DIAGNOSIS — I4891 Unspecified atrial fibrillation: Secondary | ICD-10-CM

## 2021-08-21 DIAGNOSIS — Z5181 Encounter for therapeutic drug level monitoring: Secondary | ICD-10-CM

## 2021-08-21 LAB — POCT INR: INR: 1.8 — AB (ref 2.0–3.0)

## 2021-08-21 NOTE — Patient Instructions (Signed)
-   resume your previous dosage of warfarin of 1.5 tablets every day EXCEPT 1 TABLET ON TUESDAYS AND THURSDAYS - Recheck INR next week

## 2021-08-22 ENCOUNTER — Encounter: Payer: Self-pay | Admitting: Family Medicine

## 2021-08-22 MED ORDER — CEPHALEXIN 500 MG PO CAPS: 500.0000 mg | ORAL_CAPSULE | Freq: Four times a day (QID) | ORAL | 0 refills | Status: DC

## 2021-08-26 DIAGNOSIS — Z7409 Other reduced mobility: Secondary | ICD-10-CM | POA: Insufficient documentation

## 2021-08-26 NOTE — Assessment & Plan Note (Signed)
He continues to use power mobility device which facilitates mobility around the house as well as outside of the house.  This helps prevent falls (several this past year).

## 2021-08-26 NOTE — Assessment & Plan Note (Deleted)
This has improved. He continues to use power mobility device which facilitates mobility around the house as well as outside of the house.

## 2021-08-28 ENCOUNTER — Other Ambulatory Visit: Payer: Self-pay

## 2021-08-28 ENCOUNTER — Ambulatory Visit (INDEPENDENT_AMBULATORY_CARE_PROVIDER_SITE_OTHER): Payer: Medicare Other | Admitting: Family Medicine

## 2021-08-28 ENCOUNTER — Ambulatory Visit (INDEPENDENT_AMBULATORY_CARE_PROVIDER_SITE_OTHER): Payer: Medicare Other

## 2021-08-28 ENCOUNTER — Encounter: Payer: Self-pay | Admitting: Family Medicine

## 2021-08-28 VITALS — BP 134/68 | HR 53 | Temp 97.7°F | Ht 72.0 in | Wt 207.4 lb

## 2021-08-28 DIAGNOSIS — I4891 Unspecified atrial fibrillation: Secondary | ICD-10-CM | POA: Diagnosis not present

## 2021-08-28 DIAGNOSIS — I25118 Atherosclerotic heart disease of native coronary artery with other forms of angina pectoris: Secondary | ICD-10-CM | POA: Diagnosis not present

## 2021-08-28 DIAGNOSIS — L720 Epidermal cyst: Secondary | ICD-10-CM

## 2021-08-28 DIAGNOSIS — R251 Tremor, unspecified: Secondary | ICD-10-CM

## 2021-08-28 DIAGNOSIS — Z5181 Encounter for therapeutic drug level monitoring: Secondary | ICD-10-CM

## 2021-08-28 LAB — POCT INR: INR: 2.3 (ref 2.0–3.0)

## 2021-08-28 MED ORDER — CEPHALEXIN 500 MG PO CAPS: 500.0000 mg | ORAL_CAPSULE | Freq: Three times a day (TID) | ORAL | 0 refills | Status: DC

## 2021-08-28 NOTE — Assessment & Plan Note (Addendum)
Anticipate component of essential tremor as well as chronic hypoxia contributing. Doesn't seem consistent with parkinsonisms. Discussed possible medication but hesitant to add another medicine with current polypharmacy. Discussed possible neurology referral - he would agree to this if needed.
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
## 2021-08-28 NOTE — Patient Instructions (Addendum)
Skin infection is better but you have persistent cyst - we will refer you to skin doctor to consider cyst removal.  Take antibiotic keflex 500mg  three times daily for another 5 days then may stop.

## 2021-08-28 NOTE — Patient Instructions (Signed)
-   continue your previous dosage of warfarin of 1.5 tablets every day EXCEPT 1 TABLET ON TUESDAYS AND THURSDAYS - Recheck INR 2 weeks.

## 2021-08-28 NOTE — Assessment & Plan Note (Signed)
Today describes infected epidermal cyst as he states there was cyst with pore to area prior to cellulitis developing. It did drain while on antibiotics. No further warmth or erythema however he notes discomfort overlying area has worsened. Will refill keflex TID course for another 5 days and refer to derm to consider cyst excision as pt desires definitive treatment.

## 2021-08-30 ENCOUNTER — Other Ambulatory Visit: Payer: Self-pay | Admitting: Family Medicine

## 2021-09-03 ENCOUNTER — Other Ambulatory Visit: Payer: Self-pay | Admitting: Family Medicine

## 2021-09-03 ENCOUNTER — Encounter: Payer: Self-pay | Admitting: Family Medicine

## 2021-09-03 NOTE — Telephone Encounter (Signed)
E-scribed refill.  Plz schedule wellness, lab and cpe visits.  

## 2021-09-04 ENCOUNTER — Encounter: Payer: Medicare Other | Admitting: Family Medicine

## 2021-09-04 NOTE — Telephone Encounter (Signed)
Lvm for pt to call us to schedule a cpe/lab

## 2021-09-09 ENCOUNTER — Other Ambulatory Visit: Payer: Self-pay | Admitting: Family Medicine

## 2021-09-09 DIAGNOSIS — G894 Chronic pain syndrome: Secondary | ICD-10-CM

## 2021-09-09 NOTE — Telephone Encounter (Signed)
Name of Medication: Hydrocodone Name of Pharmacy: [facility] Last Fill or Written Date and Quantity: 08-07-21 #120 Last Office Visit and Type: 08-28-21 Next Office Visit and Type: 09-10-21 Last Controlled Substance Agreement Date: NA Last UDS: NA

## 2021-09-10 ENCOUNTER — Other Ambulatory Visit: Payer: Self-pay | Admitting: Family Medicine

## 2021-09-10 ENCOUNTER — Other Ambulatory Visit: Payer: Self-pay

## 2021-09-10 ENCOUNTER — Ambulatory Visit (INDEPENDENT_AMBULATORY_CARE_PROVIDER_SITE_OTHER): Payer: Medicare Other | Admitting: Family Medicine

## 2021-09-10 ENCOUNTER — Encounter: Payer: Self-pay | Admitting: Family Medicine

## 2021-09-10 ENCOUNTER — Telehealth: Payer: Self-pay

## 2021-09-10 VITALS — BP 140/70 | HR 49 | Temp 97.9°F | Ht 71.25 in | Wt 216.0 lb

## 2021-09-10 DIAGNOSIS — I10 Essential (primary) hypertension: Secondary | ICD-10-CM

## 2021-09-10 DIAGNOSIS — J449 Chronic obstructive pulmonary disease, unspecified: Secondary | ICD-10-CM

## 2021-09-10 DIAGNOSIS — F1120 Opioid dependence, uncomplicated: Secondary | ICD-10-CM

## 2021-09-10 DIAGNOSIS — I4891 Unspecified atrial fibrillation: Secondary | ICD-10-CM | POA: Diagnosis not present

## 2021-09-10 DIAGNOSIS — I495 Sick sinus syndrome: Secondary | ICD-10-CM

## 2021-09-10 DIAGNOSIS — Z23 Encounter for immunization: Secondary | ICD-10-CM | POA: Diagnosis not present

## 2021-09-10 DIAGNOSIS — Z0000 Encounter for general adult medical examination without abnormal findings: Secondary | ICD-10-CM | POA: Diagnosis not present

## 2021-09-10 DIAGNOSIS — G8929 Other chronic pain: Secondary | ICD-10-CM

## 2021-09-10 DIAGNOSIS — Z720 Tobacco use: Secondary | ICD-10-CM | POA: Diagnosis not present

## 2021-09-10 DIAGNOSIS — E538 Deficiency of other specified B group vitamins: Secondary | ICD-10-CM

## 2021-09-10 DIAGNOSIS — G4733 Obstructive sleep apnea (adult) (pediatric): Secondary | ICD-10-CM

## 2021-09-10 DIAGNOSIS — E1140 Type 2 diabetes mellitus with diabetic neuropathy, unspecified: Secondary | ICD-10-CM

## 2021-09-10 DIAGNOSIS — D509 Iron deficiency anemia, unspecified: Secondary | ICD-10-CM

## 2021-09-10 DIAGNOSIS — E782 Mixed hyperlipidemia: Secondary | ICD-10-CM

## 2021-09-10 DIAGNOSIS — Z7901 Long term (current) use of anticoagulants: Secondary | ICD-10-CM

## 2021-09-10 DIAGNOSIS — G894 Chronic pain syndrome: Secondary | ICD-10-CM

## 2021-09-10 DIAGNOSIS — E559 Vitamin D deficiency, unspecified: Secondary | ICD-10-CM

## 2021-09-10 DIAGNOSIS — K219 Gastro-esophageal reflux disease without esophagitis: Secondary | ICD-10-CM

## 2021-09-10 DIAGNOSIS — M150 Primary generalized (osteo)arthritis: Secondary | ICD-10-CM

## 2021-09-10 DIAGNOSIS — Z7189 Other specified counseling: Secondary | ICD-10-CM | POA: Diagnosis not present

## 2021-09-10 DIAGNOSIS — C61 Malignant neoplasm of prostate: Secondary | ICD-10-CM | POA: Diagnosis not present

## 2021-09-10 DIAGNOSIS — L720 Epidermal cyst: Secondary | ICD-10-CM

## 2021-09-10 DIAGNOSIS — M159 Polyosteoarthritis, unspecified: Secondary | ICD-10-CM

## 2021-09-10 DIAGNOSIS — L989 Disorder of the skin and subcutaneous tissue, unspecified: Secondary | ICD-10-CM

## 2021-09-10 DIAGNOSIS — I2721 Secondary pulmonary arterial hypertension: Secondary | ICD-10-CM

## 2021-09-10 DIAGNOSIS — E1142 Type 2 diabetes mellitus with diabetic polyneuropathy: Secondary | ICD-10-CM

## 2021-09-10 DIAGNOSIS — I25118 Atherosclerotic heart disease of native coronary artery with other forms of angina pectoris: Secondary | ICD-10-CM | POA: Diagnosis not present

## 2021-09-10 DIAGNOSIS — K4090 Unilateral inguinal hernia, without obstruction or gangrene, not specified as recurrent: Secondary | ICD-10-CM

## 2021-09-10 DIAGNOSIS — I714 Abdominal aortic aneurysm, without rupture, unspecified: Secondary | ICD-10-CM

## 2021-09-10 LAB — CBC WITH DIFFERENTIAL/PLATELET
Basophils Absolute: 0.1 10*3/uL (ref 0.0–0.1)
Basophils Relative: 0.8 % (ref 0.0–3.0)
Eosinophils Absolute: 0.2 10*3/uL (ref 0.0–0.7)
Eosinophils Relative: 1.7 % (ref 0.0–5.0)
HCT: 39.5 % (ref 39.0–52.0)
Hemoglobin: 13.1 g/dL (ref 13.0–17.0)
Lymphocytes Relative: 16.7 % (ref 12.0–46.0)
Lymphs Abs: 1.6 10*3/uL (ref 0.7–4.0)
MCHC: 33.1 g/dL (ref 30.0–36.0)
MCV: 94.1 fl (ref 78.0–100.0)
Monocytes Absolute: 0.6 10*3/uL (ref 0.1–1.0)
Monocytes Relative: 5.7 % (ref 3.0–12.0)
Neutro Abs: 7.3 10*3/uL (ref 1.4–7.7)
Neutrophils Relative %: 75.1 % (ref 43.0–77.0)
Platelets: 238 10*3/uL (ref 150.0–400.0)
RBC: 4.2 Mil/uL — ABNORMAL LOW (ref 4.22–5.81)
RDW: 15 % (ref 11.5–15.5)
WBC: 9.7 10*3/uL (ref 4.0–10.5)

## 2021-09-10 LAB — COMPREHENSIVE METABOLIC PANEL
ALT: 15 U/L (ref 0–53)
AST: 20 U/L (ref 0–37)
Albumin: 3.7 g/dL (ref 3.5–5.2)
Alkaline Phosphatase: 77 U/L (ref 39–117)
BUN: 14 mg/dL (ref 6–23)
CO2: 30 mEq/L (ref 19–32)
Calcium: 8.9 mg/dL (ref 8.4–10.5)
Chloride: 103 mEq/L (ref 96–112)
Creatinine, Ser: 1.08 mg/dL (ref 0.40–1.50)
GFR: 66.26 mL/min (ref >60.00)
Glucose, Bld: 92 mg/dL (ref 70–99)
Potassium: 4.7 mEq/L (ref 3.5–5.1)
Sodium: 141 mEq/L (ref 135–145)
Total Bilirubin: 0.5 mg/dL (ref 0.2–1.2)
Total Protein: 6.2 g/dL (ref 6.0–8.3)

## 2021-09-10 LAB — LIPID PANEL
Cholesterol: 145 mg/dL (ref 0–200)
HDL: 42.7 mg/dL (ref >39.00)
LDL Cholesterol: 69 mg/dL (ref 0–99)
NonHDL: 101.93
Total CHOL/HDL Ratio: 3
Triglycerides: 165 mg/dL — ABNORMAL HIGH (ref 0.0–149.0)
VLDL: 33 mg/dL (ref 0.0–40.0)

## 2021-09-10 LAB — MICROALBUMIN / CREATININE URINE RATIO
Creatinine,U: 151.2 mg/dL
Microalb Creat Ratio: 3.2 mg/g (ref 0.0–30.0)
Microalb, Ur: 4.9 mg/dL — ABNORMAL HIGH (ref 0.0–1.9)

## 2021-09-10 LAB — IBC PANEL
Iron: 91 ug/dL (ref 42–165)
Saturation Ratios: 25.8 % (ref 20.0–50.0)
TIBC: 352.8 ug/dL (ref 250.0–450.0)
Transferrin: 252 mg/dL (ref 212.0–360.0)

## 2021-09-10 LAB — PSA: PSA: 0.11 ng/mL (ref 0.10–4.00)

## 2021-09-10 LAB — VITAMIN D 25 HYDROXY (VIT D DEFICIENCY, FRACTURES): VITD: 30.74 ng/mL (ref 30.00–100.00)

## 2021-09-10 LAB — HEMOGLOBIN A1C: Hgb A1c MFr Bld: 6.9 % — ABNORMAL HIGH (ref 4.6–6.5)

## 2021-09-10 LAB — VITAMIN B12: Vitamin B-12: 361 pg/mL (ref 211–911)

## 2021-09-10 LAB — TSH: TSH: 2.97 u[IU]/mL (ref 0.35–5.50)

## 2021-09-10 LAB — FERRITIN: Ferritin: 22.7 ng/mL (ref 22.0–322.0)

## 2021-09-10 MED ORDER — DICLOFENAC SODIUM 1 % EX GEL: 4.0000 g | Freq: Three times a day (TID) | CUTANEOUS | 3 refills | Status: DC

## 2021-09-10 MED ORDER — HYDROCODONE-ACETAMINOPHEN 10-325 MG PO TABS: 1.0000 | ORAL_TABLET | Freq: Four times a day (QID) | ORAL | 0 refills | Status: DC | PRN

## 2021-09-10 MED ORDER — VARENICLINE TARTRATE 1 MG PO TABS: 1.0000 mg | ORAL_TABLET | Freq: Two times a day (BID) | ORAL | 1 refills | Status: DC

## 2021-09-10 MED ORDER — VARENICLINE TARTRATE 0.5 MG X 11 & 1 MG X 42 PO MISC: ORAL | 0 refills | Status: DC

## 2021-09-10 NOTE — Assessment & Plan Note (Signed)
[place] CSRS reviewed  ?

## 2021-09-10 NOTE — Assessment & Plan Note (Signed)
Continue to encourage smoking cessation.  He agrees to retry chantix - starter month pack and continuing month packs sent to pharmacy.  Also would be interested in discussing lung cancer screening - unsure if will be eligible given age but will refer.

## 2021-09-10 NOTE — Assessment & Plan Note (Addendum)
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

## 2021-09-10 NOTE — Assessment & Plan Note (Signed)

## 2021-09-10 NOTE — Assessment & Plan Note (Signed)
Sounds regular today. Continues coumadin. Continues amiodarone. Check TSH

## 2021-09-10 NOTE — Telephone Encounter (Signed)
ERx 

## 2021-09-10 NOTE — Assessment & Plan Note (Signed)
Large. He has seen general surgery and was offered repair. He wants to postpone repair as long as possible.

## 2021-09-10 NOTE — Assessment & Plan Note (Signed)
Continues omeprazole 40mg daily.  

## 2021-09-10 NOTE — Patient Instructions (Addendum)
Labs today including prostate level.  [id number] today.  You failed your eye exam today - call to schedule an eye exam.  You can use pea sized amount of voltaren topical gel to affected knee 2-3 times a day.  We will refer you for lung cancer screening discussion.  We will refer you to skin doctor in [place] to check spot on face.  Good to see you today  Return as needed or in 4-6 months for follow up visit.   Health Maintenance After Age 77 After age 47, you are at a higher risk for certain long-term diseases and infections as well as injuries from falls. Falls are a major cause of broken bones and head injuries in people who are older than age 70. Getting regular preventive care can help to keep you healthy and well. Preventive care includes getting regular testing and making lifestyle changes as recommended by your health care provider. Talk with your health care provider about: Which screenings and tests you should have. A screening is a test that checks for a disease when you have no symptoms. A diet and exercise plan that is right for you. What should I know about screenings and tests to prevent falls? Screening and testing are the best ways to find a health problem early. Early diagnosis and treatment give you the best chance of managing medical conditions that are common after age 56. Certain conditions and lifestyle choices may make you more likely to have a fall. Your health care provider may recommend: Regular vision checks. Poor vision and conditions such as cataracts can make you more likely to have a fall. If you wear glasses, make sure to get your prescription updated if your vision changes. Medicine review. Work with your health care provider to regularly review all of the medicines you are taking, including over-the-counter medicines. Ask your health care provider about any side effects that may make you more likely to have a fall. Tell your health care provider if any medicines  that you take make you feel dizzy or sleepy. Osteoporosis screening. Osteoporosis is a condition that causes the bones to get weaker. This can make the bones weak and cause them to break more easily. Blood pressure screening. Blood pressure changes and medicines to control blood pressure can make you feel dizzy. Strength and balance checks. Your health care provider may recommend certain tests to check your strength and balance while standing, walking, or changing positions. Foot health exam. Foot pain and numbness, as well as not wearing proper footwear, can make you more likely to have a fall. Depression screening. You may be more likely to have a fall if you have a fear of falling, feel emotionally low, or feel unable to do activities that you used to do. Alcohol use screening. Using too much alcohol can affect your balance and may make you more likely to have a fall. What actions can I take to lower my risk of falls? General instructions Talk with your health care provider about your risks for falling. Tell your health care provider if: You fall. Be sure to tell your health care provider about all falls, even ones that seem minor. You feel dizzy, sleepy, or off-balance. Take over-the-counter and prescription medicines only as told by your health care provider. These include any supplements. Eat a healthy diet and maintain a healthy weight. A healthy diet includes low-fat dairy products, low-fat (lean) meats, and fiber from whole grains, beans, and lots of fruits and vegetables. Home safety  Remove any tripping hazards, such as rugs, cords, and clutter. Install safety equipment such as grab bars in bathrooms and safety rails on stairs. Keep rooms and walkways well-lit. Activity  Follow a regular exercise program to stay fit. This will help you maintain your balance. Ask your health care provider what types of exercise are appropriate for you. If you need a cane or walker, use it as recommended  by your health care provider. Wear supportive shoes that have nonskid soles. Lifestyle Do not drink alcohol if your health care provider tells you not to drink. If you drink alcohol, limit how much you have: 0-1 drink a day for women. 0-2 drinks a day for men. Be aware of how much alcohol is in your drink. In the [place] one drink equals one typical bottle of beer (12 oz), one-half glass of wine (5 oz), or one shot of hard liquor (1 oz). Do not use any products that contain nicotine or tobacco, such as cigarettes and e-cigarettes. If you need help quitting, ask your health care provider. Summary Having a healthy lifestyle and getting preventive care can help to protect your health and wellness after age 33. Screening and testing are the best way to find a health problem early and help you avoid having a fall. Early diagnosis and treatment give you the best chance for managing medical conditions that are more common for people who are older than age 58. Falls are a major cause of broken bones and head injuries in people who are older than age 47. Take precautions to prevent a fall at home. Work with your health care provider to learn what changes you can make to improve your health and wellness and to prevent falls. This information is not intended to replace advice given to you by your health care provider. Make sure you discuss any questions you have with your health care provider. Document Revised: 02/01/2021 Document Reviewed: 11/09/2020 Elsevier Patient Education  2022 [employer].

## 2021-09-10 NOTE — Assessment & Plan Note (Addendum)
Chronic, adequate. Continue current regimen (prn amlodipine, lasix.

## 2021-09-10 NOTE — Assessment & Plan Note (Signed)
Update levels on pravastatin 20mg  daily. Intolerant to higher potency statins.  The 10-year ASCVD risk score (Arnett DK, et al., 2019) is: 58.3%   Values used to calculate the score:     Age: 75 years     Sex: Male     Is Non-Hispanic African American: No     Diabetic: Yes     Tobacco smoker: Yes     Systolic Blood Pressure: 563 mmHg     Is BP treated: Yes     HDL Cholesterol: 37.2 mg/dL     Total Cholesterol: 130 mg/dL

## 2021-09-10 NOTE — Assessment & Plan Note (Signed)
Advanced directive discussion - working on this at home. Wife would be HCPOA.

## 2021-09-10 NOTE — Assessment & Plan Note (Signed)
Update levels off replacement. 

## 2021-09-10 NOTE — Assessment & Plan Note (Signed)
S/p XRT ([place]). Pt states he was released from care by urology and rad/onc. I will check PSA today.

## 2021-09-10 NOTE — Assessment & Plan Note (Signed)
Noted on prior CT. Will need to verify he continues CPAP use

## 2021-09-10 NOTE — Assessment & Plan Note (Signed)
Not regular with breztri - encouraged daily use. He has been able to decrease albuterol use to nightly.

## 2021-09-10 NOTE — Assessment & Plan Note (Signed)
Update D levels on 2000 IU daily replacement.

## 2021-09-10 NOTE — Chronic Care Management (AMB) (Addendum)
Chronic Care Management Pharmacy Assistant   Name: [name]  MRN: [id number] DOB: 1946-04-08  Reason for Encounter:  Diabetes Disease State   Recent office visits:  09/10/21-PCP-Patient presented for AWV.labs ordered (new A1C 6.9)( all normal limits)Referral for dermatology,referral to lung cancer specialist, start Chantix,start voltaren 1% gel apply topically 3 times daily.Pneumonia vaccine given. He has dropped gabapentin dose to 600mg  nightly,encouraged regular Breztri daily use, follow up 3 months 08/28/21-PCP-Patient presented for follow up cellulitis right thigh.Discussed Neurology referral,start Keflex 500mg  3 times daily for 5 days and refer to dermatology. 08/20/21- PCP-Patient presented for follow up cellulitis right thigh.Refilled doxycycline 5 day course,follow up 1 week. 08/13/21-PCP-Patient presented for follow up cellulitis right leg,Increase keflex to 4 times daily for the next 5-7 days, to take a total of at least 10 days of antibiotic. Suggested change  Gabapentin to 600mg  TID for better coverage for tremors  throughout the day ,Follow up 6 days  08/09/21-Family Medicine-Patient presented for right leg redness and swelling-Will need to tx for mrsa since he has had before Rocephin 1 g today Keflex 500 mg tid 10d Bactrim DS 2 pills bid for 10 d Keep area very clean with soap and water  Elevate your leg if it helps   Recent consult visits:  08/20/21-Pulmonology-Patient presented for follow up COPD.Breztri samples given,company contacted to send water bottle for concentrator. Follow up 6 months 07/09/49-[facility]-Patient presented for right inguinal hernia.Treatment options discussed.Will continue to watch 07/01/21-Cardiology- Patient presented for preop clearance.EKG,recommend smoking cessation  06/25/21-[facility] presented for evaluation for hernia surgery.  Hospital visits:  None in previous 6 months  Medications: Outpatient  Encounter Medications as of 09/10/2021  Medication Sig   albuterol (VENTOLIN HFA) 108 (90 Base) MCG/ACT inhaler INHALE 2 PUFFS INTO THE LUNGS EVERY 6 HOURS AS NEEDED FOR WHEEZING OR SHORTNESS OF BREATH   amiodarone (PACERONE) 200 MG tablet Take 200 mg by mouth daily.   amLODipine (NORVASC) 5 MG tablet Take 5 mg by mouth as needed.   Budeson-Glycopyrrol-Formoterol (BREZTRI AEROSPHERE) 160-9-4.8 MCG/ACT AERO Inhale 2 puffs into the lungs in the morning and at bedtime.   Budeson-Glycopyrrol-Formoterol (BREZTRI AEROSPHERE) 160-9-4.8 MCG/ACT AERO Inhale 2 puffs into the lungs in the morning and at bedtime.   cephALEXin (KEFLEX) 500 MG capsule Take 1 capsule (500 mg total) by mouth 3 (three) times daily.   Cholecalciferol (VITAMIN D) 50 MCG (2000 UT) CAPS Take 1 capsule (2,000 Units total) by mouth daily.   Coenzyme Q10 (COQ-10 PO) Take 1 tablet by mouth daily.   Cyanocobalamin (VITAMIN B-12) 500 MCG TABS Take 500 mcg by mouth daily.   diclofenac Sodium (VOLTAREN) 1 % GEL Apply 4 g topically 3 (three) times daily.   diphenoxylate-atropine (LOMOTIL) 2.5-0.025 MG tablet Take 1 tablet by mouth 2 (two) times daily as needed.   fish oil-omega-3 fatty acids 1000 MG capsule Take 2 g by mouth daily.   furosemide (LASIX) 20 MG tablet TAKE 1 TABLET BY MOUTH EVERY DAY AS NEEDED FOR FLUID RETENTION   gabapentin (NEURONTIN) 600 MG tablet Take 1 tablet (600 mg total) by mouth at bedtime.   HYDROcodone-acetaminophen (NORCO) 10-325 MG tablet Take 1 tablet by mouth every 6 (six) hours as needed for moderate pain or severe pain.   ipratropium-albuterol (DUONEB) 0.5-2.5 (3) MG/3ML SOLN Take 3 mLs by nebulization every 6 (six) hours as needed.   metFORMIN (GLUCOPHAGE-XR) 500 MG 24 hr tablet TAKE 2 TABLETS(1000 MG) BY MOUTH DAILY WITH BREAKFAST  methocarbamol (ROBAXIN) 500 MG tablet TAKE 1 TABLET(500 MG) BY MOUTH AT BEDTIME   midodrine (PROAMATINE) 10 MG tablet Take 1 tablet (10 mg total) by mouth 3 (three) times daily as  needed.   nitroGLYCERIN (NITROLINGUAL) 0.4 MG/SPRAY spray USE 1 SPRAY AS DIRECTED EVERY 5 MINUTES AS NEEDED   omeprazole (PRILOSEC) 40 MG capsule Take 1 capsule (40 mg total) by mouth daily.   pravastatin (PRAVACHOL) 20 MG tablet TAKE 1 TABLET(20 MG) BY MOUTH DAILY   varenicline (CHANTIX CONTINUING MONTH PAK) 1 MG tablet Take 1 tablet (1 mg total) by mouth 2 (two) times daily.   varenicline (CHANTIX STARTING MONTH PAK) 0.5 MG X 11 & 1 MG X 42 tablet Take one 0.5 mg tablet by mouth once daily for 3 days, then increase to one 0.5 mg tablet twice daily for 4 days, then increase to one 1 mg tablet twice daily.   warfarin (COUMADIN) 2.5 MG tablet TAKE AS DIRECTED FOR ANTI-COAG CLINIC   No facility-administered encounter medications on file as of 09/10/2021.      Recent Relevant Labs: Lab Results  Component Value Date/Time   HGBA1C 6.9 (H) 04/04/2021 11:24 AM   HGBA1C 7.6 (H) 01/15/2021 01:06 PM   MICROALBUR 3.3 (H) 04/04/2021 11:24 AM   MICROALBUR 3.1 (H) 04/16/2020 08:37 AM    Kidney Function Lab Results  Component Value Date/Time   CREATININE 0.97 04/04/2021 11:24 AM   CREATININE 1.18 02/01/2021 09:46 AM   CREATININE 1.34 (H) 06/09/2018 07:00 PM   CREATININE 1.16 02/27/2012 03:51 PM   GFR 75.61 04/04/2021 11:24 AM   GFRNONAA 76 09/07/2017 02:19 PM   GFRAA 88 09/07/2017 02:19 PM     Contacted patient on 10/143/22 to discuss diabetes disease state.   Current antihyperglycemic regimen:  Metformin XR 500mg  take 2 tablets with breakfast daily     Patient verbally confirms he is taking the above medications as directed. Yes  What diet changes have been made to improve diabetes control? Patient reports eating more fresh fruits and vegetables and watching the carbohydrates  What recent interventions/DTPs have been made to improve glycemic control: The patient has decreased the gabapentin dose due to some improvement with diabetic neuropathy  The patient has started Chantix for smoking  cessation.  Have there been any recent hospitalizations or ED visits since last visit with CPP? No  Patient denies hypoglycemic symptoms, including Pale, Sweaty, Shaky, Hungry, Nervous/irritable, and Vision changes  Patient denies hyperglycemic symptoms, including blurry vision, excessive thirst, fatigue, polyuria, and weakness  How often are you checking your blood sugar? once daily  What are your blood sugars ranging?  Fasting:  ranging 120-140   During the week, how often does your blood glucose drop below 70? Never  Are you checking your feet daily/regularly? Yes  Adherence Review: Is the patient currently on a STATIN medication? Yes Is the patient currently on ACE/ARB medication? No Does the patient have >5 day gap between last estimated fill dates? No  Care Gaps: Annual wellness visit in last year? Yes  09/10/21 Most recent A1C reading: 6.9   09/10/21 Most Recent BP reading:  140/70  49-P  09/10/21  Last eye exam / retinopathy screening:12/2019 Last diabetic foot exam: 01/2020  Counseled patient on importance of annual eye and foot exam.   Star Rating Drugs:  Medication:  Last Fill: Day Supply Metformin 500mg   09/03/21 90 Pravastatin 20mg   07/22/21 90  No appointments scheduled within the next 30 days.  [name], CPP notified  Avel Sensor, [place] Pharmacy Assistant [phone]  I have reviewed the care management and care coordination activities outlined in this encounter and I am certifying that I agree with the content of this note. No further action required.  [name], PharmD Clinical Pharmacist [facility] [phone]

## 2021-09-10 NOTE — Assessment & Plan Note (Signed)
Needs yearly VVS evaluation for endoscopically repaired AAA.

## 2021-09-10 NOTE — Progress Notes (Signed)
Patient ID: [name], male    DOB: 11-28-1946, 75 y.o.   MRN: [id number]  This visit was conducted in person.  BP 140/70   Pulse (!) 49   Temp 97.9 F (36.6 C) (Temporal)   Ht 5' 11.25" (1.81 m)   Wt 216 lb (98 kg)   SpO2 96%   BMI 29.91 kg/m    CC: AMW Subjective:   HPI: [name] is a 75 y.o. male presenting on 09/10/2021 for Medicare Wellness and Knee Pain (Wants to discuss right knee pain.  States he had arthritis for awhile. )   Did not see health advisor this year.   Hearing Screening   500Hz  1000Hz  2000Hz  4000Hz   Right ear 0 40 20 0  Left ear 25 25 25  0  Comments: Per pt, wife thinks pt's hearing is worsening.   Vision Screening   Right eye Left eye Both eyes  Without correction     With correction 20/50 20/200 20/50  Declines audiology referral. Wants to get amplifiers.  Due for eye appt. Known cataracts.  [facility] Office Visit from 09/10/2021 in [facility] [place]  PHQ-2 Total Score 1       Fall Risk  09/10/2021 05/02/2020 04/18/2019 01/06/2018 01/04/2018  Falls in the past year? 1 1 0 Yes Yes  Comment - - - - -  Number falls in past yr: 1 1 - - 1  Comment - - - - -  Injury with Fall? 0 0 - No -  Risk Factor Category  - - - - -  Risk for fall due to : - - - - Impaired balance/gait  Follow up - - - - -    Preventative: Colon cancer screening - h/o IDA that resolved with replacement. Last saw GI [name]) 12/2017 - hesitant at that time for luminal evaluation given comorbidities. Never returned iFOB. He had reassuring abd/pelvic CT. From GI's last note: "If he were to consent to a procedure or sedation, further cardiac clearance would be required due his bradycardia". Denies blood in stool.  Prostate cancer treatment - completed radiation therapy ([name]). States released from rad onc Q6 mo.  Lung cancer screening - [name] on chest CT 01/2018. Interested in ongoing screening, unsure if eligible - will refer.  Flu shot -  declines COVID vaccine - Pfizer 02/2020, 03/2020 Prevnar-20 today Td 04/2020  Shingrix - declines  Advanced directive discussion - working on this at home. Wife would be HCPOA.  Seat belt use discussed Sunscreen use discussed, no changing moles. Wants spot on face checked - lesion on left lip present for years, may be changing Smoker - 1 ppd longterm smoker (52 yrs) - chantix didn't help (vivid dreams). Interested in trying again.  Alcohol - rare  Dentist - has upper dentures, lost lower dentures - no luck getting fitted again despite multiple attempts Eye exam overdue - last seen 2021  Bowel - no diarrhea/constipation  Bladder - no incontinence   Caffeine: 1 cup coffee, 1/2 gallon unsweet tea, 2 soda/day Lives with wife and 1 dog, 6 cats outside. Occ: Long distance truck driver Started seeking medical care when he received medicare. H/o noncompliance     Relevant past medical, surgical, family and social history reviewed and updated as indicated. Interim medical history since our last visit reviewed. Allergies and medications reviewed and updated. Outpatient Medications Prior to Visit  Medication Sig Dispense Refill   albuterol (VENTOLIN HFA) 108 (90 Base) MCG/ACT inhaler  INHALE 2 PUFFS INTO THE LUNGS EVERY 6 HOURS AS NEEDED FOR WHEEZING OR SHORTNESS OF BREATH 8.5 g 3   amiodarone (PACERONE) 200 MG tablet Take 200 mg by mouth daily.     amLODipine (NORVASC) 5 MG tablet Take 5 mg by mouth as needed.     Budeson-Glycopyrrol-Formoterol (BREZTRI AEROSPHERE) 160-9-4.8 MCG/ACT AERO Inhale 2 puffs into the lungs in the morning and at bedtime. 5.9 g 0   Budeson-Glycopyrrol-Formoterol (BREZTRI AEROSPHERE) 160-9-4.8 MCG/ACT AERO Inhale 2 puffs into the lungs in the morning and at bedtime. 10.7 g 0   cephALEXin (KEFLEX) 500 MG capsule Take 1 capsule (500 mg total) by mouth 3 (three) times daily. 15 capsule 0   Cholecalciferol (VITAMIN D) 50 MCG (2000 UT) CAPS Take 1 capsule (2,000 Units total) by  mouth daily. 30 capsule    Coenzyme Q10 (COQ-10 PO) Take 1 tablet by mouth daily.     Cyanocobalamin (VITAMIN B-12) 500 MCG TABS Take 500 mcg by mouth daily.     diphenoxylate-atropine (LOMOTIL) 2.5-0.025 MG tablet Take 1 tablet by mouth 2 (two) times daily as needed. 20 tablet 1   fish oil-omega-3 fatty acids 1000 MG capsule Take 2 g by mouth daily.     furosemide (LASIX) 20 MG tablet TAKE 1 TABLET BY MOUTH EVERY DAY AS NEEDED FOR FLUID RETENTION 90 tablet 0   gabapentin (NEURONTIN) 600 MG tablet Take 1 tablet (600 mg total) by mouth at bedtime.     HYDROcodone-acetaminophen (NORCO) 10-325 MG tablet Take 1 tablet by mouth every 6 (six) hours as needed for moderate pain or severe pain. 120 tablet 0   ipratropium-albuterol (DUONEB) 0.5-2.5 (3) MG/3ML SOLN Take 3 mLs by nebulization every 6 (six) hours as needed. 360 mL 11   metFORMIN (GLUCOPHAGE-XR) 500 MG 24 hr tablet TAKE 2 TABLETS(1000 MG) BY MOUTH DAILY WITH BREAKFAST 180 tablet 0   methocarbamol (ROBAXIN) 500 MG tablet TAKE 1 TABLET(500 MG) BY MOUTH AT BEDTIME 90 tablet 1   midodrine (PROAMATINE) 10 MG tablet Take 1 tablet (10 mg total) by mouth 3 (three) times daily as needed. 90 tablet 1   nitroGLYCERIN (NITROLINGUAL) 0.4 MG/SPRAY spray USE 1 SPRAY AS DIRECTED EVERY 5 MINUTES AS NEEDED 4.9 g 1   omeprazole (PRILOSEC) 40 MG capsule Take 1 capsule (40 mg total) by mouth daily. 90 capsule 3   pravastatin (PRAVACHOL) 20 MG tablet TAKE 1 TABLET(20 MG) BY MOUTH DAILY 90 tablet 0   warfarin (COUMADIN) 2.5 MG tablet TAKE AS DIRECTED FOR ANTI-COAG CLINIC 135 tablet 1   No facility-administered medications prior to visit.     Per HPI unless specifically indicated in ROS section below Review of Systems  Objective:  BP 140/70   Pulse (!) 49   Temp 97.9 F (36.6 C) (Temporal)   Ht 5' 11.25" (1.81 m)   Wt 216 lb (98 kg)   SpO2 96%   BMI 29.91 kg/m   Wt Readings from Last 3 Encounters:  09/10/21 216 lb (98 kg)  08/28/21 207 lb 7 oz (94.1  kg)  08/20/21 215 lb 6 oz (97.7 kg)      Physical Exam Vitals and nursing note reviewed.  Constitutional:      General: He is not in acute distress.    Appearance: Normal appearance. He is well-developed. He is not ill-appearing.  HENT:     Head: Normocephalic and atraumatic.     Right Ear: Hearing, tympanic membrane, ear canal and external ear normal.     Left  Ear: Hearing, tympanic membrane, ear canal and external ear normal.  Eyes:     General: No scleral icterus.    Extraocular Movements: Extraocular movements intact.     Conjunctiva/sclera: Conjunctivae normal.     Pupils: Pupils are equal, round, and reactive to light.  Neck:     Thyroid: No thyroid mass or thyromegaly.     Vascular: No carotid bruit.  Cardiovascular:     Rate and Rhythm: Normal rate and regular rhythm.     Pulses: Normal pulses.          Radial pulses are 2+ on the right side and 2+ on the left side.     Heart sounds: Normal heart sounds. No murmur heard. Pulmonary:     Effort: Pulmonary effort is normal. No respiratory distress.     Breath sounds: Normal breath sounds. No wheezing, rhonchi or rales.  Abdominal:     General: Bowel sounds are normal. There is no distension.     Palpations: Abdomen is soft. There is no mass.     Tenderness: There is no abdominal tenderness. There is no guarding or rebound.     Hernia: A hernia is present. Hernia is present in the right inguinal area. There is no hernia in the left inguinal area.  Musculoskeletal:        General: Normal range of motion.     Cervical back: Normal range of motion and neck supple.     Right lower leg: No edema.     Left lower leg: No edema.  Lymphadenopathy:     Cervical: No cervical adenopathy.  Skin:    General: Skin is warm and dry.     Findings: No rash.  Neurological:     General: No focal deficit present.     Mental Status: He is alert and oriented to person, place, and time.     Comments:  Recall 1/3, 1/3 with cue Calculation  3/5 DLOW  Psychiatric:        Mood and Affect: Mood normal.        Behavior: Behavior normal.        Thought Content: Thought content normal.        Judgment: Judgment normal.      Results for orders placed or performed in visit on 08/28/21  POCT INR  Result Value Ref Range   INR 2.3 2.0 - 3.0   *Note: Due to a large number of results and/or encounters for the requested time period, some results have not been displayed. A complete set of results can be found in Results Review.   Lab Results  Component Value Date   HGBA1C 6.9 (H) 04/04/2021    Lab Results  Component Value Date   CHOL 130 04/04/2021   HDL 37.20 (L) 04/04/2021   LDLCALC 72 04/04/2021   LDLDIRECT 152.0 06/08/2015   TRIG 104.0 04/04/2021   CHOLHDL 3 04/04/2021    Assessment & Moore:  This visit occurred during the SARS-CoV-2 public health emergency.  Safety protocols were in place, including screening questions prior to the visit, additional usage of staff PPE, and extensive cleaning of exam room while observing appropriate contact time as indicated for disinfecting solutions.   Problem List Items Addressed This Visit     Chronic pain syndrome (Chronic)   Encounter for chronic pain management (Chronic)    [place] CSRS reviewed      Opiate dependence (HCC) (Chronic)   Osteoarthritis (Chronic)    Chronic pain stems from polyarticular  osteoarthritis.       Advanced care planning/counseling discussion (Chronic)    Advanced directive discussion - working on this at home. Wife would be HCPOA.       Medicare annual wellness visit, subsequent - Primary (Chronic)    I have personally reviewed the Medicare Annual Wellness questionnaire and have noted 1. The patient's medical and social history 2. Their use of alcohol, tobacco or illicit drugs 3. Their current medications and supplements 4. The patient's functional ability including ADL's, fall risks, home safety risks and hearing or visual impairment. Cognitive  function has been assessed and addressed as indicated.  5. Diet and physical activity 6. Evidence for depression or mood disorders The patients weight, height, BMI have been recorded in the chart. I have made referrals, counseling and provided education to the patient based on review of the above and I have provided the pt with a written personalized care Moore for preventive services. Provider list updated.. See scanned questionairre as needed for further documentation. Reviewed preventative protocols and updated unless pt declined.       Hyperlipidemia    Update levels on pravastatin 20mg  daily. Intolerant to higher potency statins.  The 10-year ASCVD risk score (Arnett DK, et al., 2019) is: 58.3%   Values used to calculate the score:     Age: 59 years     Sex: Male     Is Non-Hispanic African American: No     Diabetic: Yes     Tobacco smoker: Yes     Systolic Blood Pressure: 185 mmHg     Is BP treated: Yes     HDL Cholesterol: 37.2 mg/dL     Total Cholesterol: 130 mg/dL       Relevant Orders   Lipid panel   Comprehensive metabolic panel   Tobacco abuse    Continue to encourage smoking cessation.  He agrees to retry chantix - starter month pack and continuing month packs sent to pharmacy.  Also would be interested in discussing lung cancer screening - unsure if will be eligible given age but will refer.       HTN (hypertension)    Chronic, adequate. Continue current regimen (prn amlodipine, lasix.       Atrial fibrillation ([place])    Sounds regular today. Continues coumadin. Continues amiodarone. Check TSH       Relevant Orders   TSH   COPD mixed type ([place])    Not regular with breztri - encouraged daily use. He has been able to decrease albuterol use to nightly.       Relevant Medications   varenicline (CHANTIX STARTING MONTH PAK) 0.5 MG X 11 & 1 MG X 42 tablet   varenicline (CHANTIX CONTINUING MONTH PAK) 1 MG tablet   Esophageal reflux    Continues omeprazole 40mg   daily      Type 2 diabetes, controlled, with neuropathy ([place])    Update a1c - has been well controlled on metformin XR 1000mg  daily.       Relevant Orders   Hemoglobin A1c   Microalbumin / creatinine urine ratio   Diabetic polyneuropathy ([place])    Has been able to drop gabapentin dose to 600mg  nightly.       Relevant Medications   varenicline (CHANTIX STARTING MONTH PAK) 0.5 MG X 11 & 1 MG X 42 tablet   varenicline (CHANTIX CONTINUING MONTH PAK) 1 MG tablet   AAA (abdominal aortic aneurysm) without rupture    Needs yearly VVS evaluation for endoscopically repaired  AAA.       OSA (obstructive sleep apnea)   Current use of long term anticoagulation (Coumadin)   Sick sinus syndrome (HCC)   Iron deficiency anemia    Update levels off replacement.       Relevant Orders   Ferritin   IBC panel   CBC with Differential/Platelet   Vitamin B12 deficiency    Update b12 levels on 529mcg replacement      Relevant Orders   Vitamin B12   Vitamin D insufficiency    Update D levels on 2000 IU daily replacement.       Relevant Orders   VITAMIN D 25 Hydroxy (Vit-D Deficiency, Fractures)   Prostate cancer ([place])    S/p XRT ([name]). Pt states he was released from care by urology and rad/onc. I will check PSA today.       Relevant Orders   PSA   [name] (pulmonary artery hypertension) ([place])    Noted on prior CT. Will need to verify he continues CPAP use      Right inguinal hernia    Large. He has seen general surgery and was offered repair. He wants to postpone repair as long as possible.       Skin lesion    Nodule with possible central telangectasia and rolling borders to left lateral lip - will refer to dermatology for evaluation r/o BCC.       Relevant Orders   Ambulatory referral to Dermatology   Epidermal cyst    Significant improvement after infected cyst s/p antibiotic treatment. He also wants this re evaluated.       Relevant Orders   Ambulatory referral to  Dermatology   Other Visit Diagnoses     Need for vaccination against Streptococcus pneumoniae       Relevant Orders   Pneumococcal conjugate vaccine 20-valent (Completed)        Meds ordered this encounter  Medications   diclofenac Sodium (VOLTAREN) 1 % GEL    Sig: Apply 4 g topically 3 (three) times daily.    Dispense:  100 g    Refill:  3   varenicline (CHANTIX STARTING MONTH PAK) 0.5 MG X 11 & 1 MG X 42 tablet    Sig: Take one 0.5 mg tablet by mouth once daily for 3 days, then increase to one 0.5 mg tablet twice daily for 4 days, then increase to one 1 mg tablet twice daily.    Dispense:  53 tablet    Refill:  0   varenicline (CHANTIX CONTINUING MONTH PAK) 1 MG tablet    Sig: Take 1 tablet (1 mg total) by mouth 2 (two) times daily.    Dispense:  60 tablet    Refill:  1    Orders Placed This Encounter  Procedures   Pneumococcal conjugate vaccine 20-valent   Ferritin   Vitamin B12   IBC panel   VITAMIN D 25 Hydroxy (Vit-D Deficiency, Fractures)   Lipid panel   TSH   Comprehensive metabolic panel   Hemoglobin A1c   PSA   CBC with Differential/Platelet   Microalbumin / creatinine urine ratio   Ambulatory referral to Dermatology    Referral Priority:   Routine    Referral Type:   Consultation    Referral Reason:   Specialty Services Required    Requested Specialty:   Dermatology    Number of Visits Requested:   1    Patient instructions: Labs today including prostate level.  VEHMCNO-70 today.  You failed your eye exam today - call to schedule an eye exam.  You can use pea sized amount of voltaren topical gel to affected knee 2-3 times a day.  We will refer you for lung cancer screening discussion.  We will refer you to skin doctor in [place] to check spot on face.  Good to see you today  Return as needed or in 3 months for chronic pain management follow up visit.   Follow up Moore: Return in about 3 months (around 12/11/2021) for follow up visit.  Ria Bush, MD

## 2021-09-10 NOTE — Assessment & Plan Note (Signed)
Has been able to drop gabapentin dose to 600mg  nightly.

## 2021-09-10 NOTE — Assessment & Plan Note (Signed)
Update a1c - has been well controlled on metformin XR 1000mg  daily.

## 2021-09-10 NOTE — Assessment & Plan Note (Signed)
Significant improvement after infected cyst s/p antibiotic treatment. He also wants this re evaluated.

## 2021-09-10 NOTE — Assessment & Plan Note (Signed)
Chronic pain stems from polyarticular osteoarthritis.

## 2021-09-10 NOTE — Assessment & Plan Note (Signed)
Update b12 levels on 577mcg replacement

## 2021-09-11 ENCOUNTER — Ambulatory Visit (INDEPENDENT_AMBULATORY_CARE_PROVIDER_SITE_OTHER): Payer: Medicare Other

## 2021-09-11 DIAGNOSIS — Z5181 Encounter for therapeutic drug level monitoring: Secondary | ICD-10-CM | POA: Diagnosis not present

## 2021-09-11 DIAGNOSIS — I4891 Unspecified atrial fibrillation: Secondary | ICD-10-CM

## 2021-09-11 LAB — POCT INR: INR: 3.6 — AB (ref 2.0–3.0)

## 2021-09-11 NOTE — Patient Instructions (Addendum)
-   since you've taken your warfarin today, TRY TO HAVE SOME [place] TODAY,  - [place], then - continue dosage of warfarin of 1.5 tablets every day EXCEPT 1 TABLET ON TUESDAYS AND THURSDAYS - Recheck INR 2 weeks.

## 2021-09-14 ENCOUNTER — Other Ambulatory Visit: Payer: Self-pay | Admitting: Family Medicine

## 2021-09-14 DIAGNOSIS — E538 Deficiency of other specified B group vitamins: Secondary | ICD-10-CM

## 2021-09-14 MED ORDER — B-12 1000 MCG SL SUBL: 1.0000 | SUBLINGUAL_TABLET | Freq: Every day | SUBLINGUAL | Status: DC

## 2021-10-02 ENCOUNTER — Ambulatory Visit (INDEPENDENT_AMBULATORY_CARE_PROVIDER_SITE_OTHER): Payer: Medicare Other

## 2021-10-02 ENCOUNTER — Other Ambulatory Visit: Payer: Self-pay

## 2021-10-02 DIAGNOSIS — I4891 Unspecified atrial fibrillation: Secondary | ICD-10-CM

## 2021-10-02 DIAGNOSIS — Z5181 Encounter for therapeutic drug level monitoring: Secondary | ICD-10-CM | POA: Diagnosis not present

## 2021-10-02 LAB — POCT INR: INR: 4.1 — AB (ref 2.0–3.0)

## 2021-10-02 NOTE — Patient Instructions (Signed)
-   SKIP WARFARIN TOMORROW, then - START NEW DOSAGE of warfarin of 1 tablet every day EXCEPT 1.5 TABLET ON MONDAYS, WEDNESDAYS & FRIDAYS - Recheck INR 2 weeks.

## 2021-10-08 HISTORY — PX: PARTIAL COLECTOMY: SHX5273

## 2021-10-08 HISTORY — PX: INGUINAL HERNIA REPAIR: SUR1180

## 2021-10-10 ENCOUNTER — Other Ambulatory Visit: Payer: Self-pay | Admitting: Family Medicine

## 2021-10-10 DIAGNOSIS — G894 Chronic pain syndrome: Secondary | ICD-10-CM

## 2021-10-10 MED ORDER — HYDROCODONE-ACETAMINOPHEN 10-325 MG PO TABS: 1.0000 | ORAL_TABLET | Freq: Four times a day (QID) | ORAL | 0 refills | Status: DC | PRN

## 2021-10-10 NOTE — Telephone Encounter (Signed)
Name of Medication: Hydrocodone-APAP Name of Pharmacy: [facility] [address] Last Fill or Written Date and Quantity: 09/10/21, #120 Last Office Visit and Type: 09/10/21, AWV Next Office Visit and Type: 12/11/21, 3 mo chronic pain f/u Last Controlled Substance Agreement Date: 02/03/18 Last UDS: 07/06/17

## 2021-10-10 NOTE — Telephone Encounter (Signed)
ERx 

## 2021-10-16 ENCOUNTER — Ambulatory Visit (INDEPENDENT_AMBULATORY_CARE_PROVIDER_SITE_OTHER): Payer: Medicare Other

## 2021-10-16 ENCOUNTER — Other Ambulatory Visit: Payer: Self-pay

## 2021-10-16 DIAGNOSIS — Z5181 Encounter for therapeutic drug level monitoring: Secondary | ICD-10-CM | POA: Diagnosis not present

## 2021-10-16 DIAGNOSIS — I4891 Unspecified atrial fibrillation: Secondary | ICD-10-CM

## 2021-10-16 LAB — POCT INR: INR: 2.6 (ref 2.0–3.0)

## 2021-10-16 NOTE — Patient Instructions (Signed)
-   continue dosage of warfarin of 1 tablet every day EXCEPT 1.5 TABLET ON MONDAYS, WEDNESDAYS & FRIDAYS - Recheck INR 2 weeks.

## 2021-10-18 ENCOUNTER — Other Ambulatory Visit: Payer: Self-pay

## 2021-10-18 MED ORDER — PRAVASTATIN SODIUM 20 MG PO TABS: ORAL_TABLET | ORAL | 3 refills | Status: DC

## 2021-10-18 NOTE — Telephone Encounter (Signed)
E-scribed refill 

## 2021-10-30 ENCOUNTER — Ambulatory Visit (INDEPENDENT_AMBULATORY_CARE_PROVIDER_SITE_OTHER): Payer: Medicare Other

## 2021-10-30 DIAGNOSIS — I4891 Unspecified atrial fibrillation: Secondary | ICD-10-CM | POA: Diagnosis not present

## 2021-10-30 DIAGNOSIS — Z5181 Encounter for therapeutic drug level monitoring: Secondary | ICD-10-CM

## 2021-10-30 LAB — POCT INR: INR: 6.4 — AB (ref 2.0–3.0)

## 2021-10-30 NOTE — Patient Instructions (Signed)
-   skip warfarin for 4 days, - on Sunday, resume dosage of warfarin of 1 tablet every day EXCEPT 1.5 TABLET ON MONDAYS, WEDNESDAYS & FRIDAYS - Recheck INR next week

## 2021-10-30 NOTE — Progress Notes (Signed)
Pt's INR is 6.4 on POCT machine. Advised him and his wife that he needs to have venipuncture but he refuses.  Attempted to schedule him a 1 week f/u next Wed, but when I told him that I will not be here, he refuses this appt, as he "does not want to see a man".  Offered him appt at [facility], but his wife can only get off of work next Friday to bring him. Pt is very argumentative and states that he does not want to see anyone but me.  Pt is scheduled to see surgeon next week and asks how long he should hold warfarin if they schedule him for surgery.  Advised him that their office will need to obtain cardiac clearance from our office to hold warfarin.  Pt is again argumentative about whether he will have a stroke by holding warfarin or bleed to death by taking it.  Pt would not give me the opportunity to explain to him the reason why he needs cardiac clearance, so explained it to his wife, who seems frustrated w/ him.

## 2021-11-03 ENCOUNTER — Other Ambulatory Visit: Payer: Self-pay | Admitting: Cardiovascular Disease

## 2021-11-03 ENCOUNTER — Emergency Department
Admission: EM | Admit: 2021-11-03 | Discharge: 2021-11-03 | Disposition: A | Discharge status: Home / Self Care | Payer: Medicare Other | Attending: Emergency Medicine | Admitting: Emergency Medicine

## 2021-11-03 ENCOUNTER — Encounter: Payer: Self-pay | Admitting: Emergency Medicine

## 2021-11-03 ENCOUNTER — Other Ambulatory Visit: Payer: Self-pay

## 2021-11-03 DIAGNOSIS — R531 Weakness: Secondary | ICD-10-CM | POA: Insufficient documentation

## 2021-11-03 DIAGNOSIS — I110 Hypertensive heart disease with heart failure: Secondary | ICD-10-CM | POA: Diagnosis not present

## 2021-11-03 DIAGNOSIS — Z5321 Procedure and treatment not carried out due to patient leaving prior to being seen by health care provider: Secondary | ICD-10-CM | POA: Insufficient documentation

## 2021-11-03 DIAGNOSIS — K4030 Unilateral inguinal hernia, with obstruction, without gangrene, not specified as recurrent: Secondary | ICD-10-CM | POA: Diagnosis not present

## 2021-11-03 DIAGNOSIS — K6389 Other specified diseases of intestine: Secondary | ICD-10-CM | POA: Diagnosis not present

## 2021-11-03 DIAGNOSIS — Z9049 Acquired absence of other specified parts of digestive tract: Secondary | ICD-10-CM | POA: Diagnosis not present

## 2021-11-03 DIAGNOSIS — G4733 Obstructive sleep apnea (adult) (pediatric): Secondary | ICD-10-CM | POA: Diagnosis not present

## 2021-11-03 DIAGNOSIS — R52 Pain, unspecified: Secondary | ICD-10-CM | POA: Diagnosis not present

## 2021-11-03 DIAGNOSIS — K4031 Unilateral inguinal hernia, with obstruction, without gangrene, recurrent: Secondary | ICD-10-CM | POA: Diagnosis not present

## 2021-11-03 DIAGNOSIS — I509 Heart failure, unspecified: Secondary | ICD-10-CM | POA: Diagnosis not present

## 2021-11-03 DIAGNOSIS — K449 Diaphragmatic hernia without obstruction or gangrene: Secondary | ICD-10-CM | POA: Diagnosis not present

## 2021-11-03 DIAGNOSIS — Z9889 Other specified postprocedural states: Secondary | ICD-10-CM | POA: Diagnosis not present

## 2021-11-03 DIAGNOSIS — F05 Delirium due to known physiological condition: Secondary | ICD-10-CM | POA: Diagnosis not present

## 2021-11-03 DIAGNOSIS — Z8249 Family history of ischemic heart disease and other diseases of the circulatory system: Secondary | ICD-10-CM | POA: Diagnosis not present

## 2021-11-03 DIAGNOSIS — Z7982 Long term (current) use of aspirin: Secondary | ICD-10-CM | POA: Diagnosis not present

## 2021-11-03 DIAGNOSIS — E1142 Type 2 diabetes mellitus with diabetic polyneuropathy: Secondary | ICD-10-CM | POA: Diagnosis not present

## 2021-11-03 DIAGNOSIS — I716 Thoracoabdominal aortic aneurysm, without rupture, unspecified: Secondary | ICD-10-CM | POA: Diagnosis not present

## 2021-11-03 DIAGNOSIS — Z9981 Dependence on supplemental oxygen: Secondary | ICD-10-CM | POA: Diagnosis not present

## 2021-11-03 DIAGNOSIS — E785 Hyperlipidemia, unspecified: Secondary | ICD-10-CM | POA: Diagnosis not present

## 2021-11-03 DIAGNOSIS — K4090 Unilateral inguinal hernia, without obstruction or gangrene, not specified as recurrent: Secondary | ICD-10-CM | POA: Diagnosis not present

## 2021-11-03 DIAGNOSIS — R112 Nausea with vomiting, unspecified: Secondary | ICD-10-CM | POA: Insufficient documentation

## 2021-11-03 DIAGNOSIS — R159 Full incontinence of feces: Secondary | ICD-10-CM | POA: Diagnosis not present

## 2021-11-03 DIAGNOSIS — R197 Diarrhea, unspecified: Secondary | ICD-10-CM | POA: Insufficient documentation

## 2021-11-03 DIAGNOSIS — R1084 Generalized abdominal pain: Secondary | ICD-10-CM | POA: Diagnosis not present

## 2021-11-03 DIAGNOSIS — F17210 Nicotine dependence, cigarettes, uncomplicated: Secondary | ICD-10-CM | POA: Diagnosis not present

## 2021-11-03 DIAGNOSIS — K219 Gastro-esophageal reflux disease without esophagitis: Secondary | ICD-10-CM | POA: Diagnosis not present

## 2021-11-03 DIAGNOSIS — J449 Chronic obstructive pulmonary disease, unspecified: Secondary | ICD-10-CM | POA: Diagnosis not present

## 2021-11-03 DIAGNOSIS — Z79899 Other long term (current) drug therapy: Secondary | ICD-10-CM | POA: Diagnosis not present

## 2021-11-03 DIAGNOSIS — R791 Abnormal coagulation profile: Secondary | ICD-10-CM | POA: Diagnosis not present

## 2021-11-03 DIAGNOSIS — K55049 Acute infarction of large intestine, extent unspecified: Secondary | ICD-10-CM | POA: Diagnosis not present

## 2021-11-03 DIAGNOSIS — Z20822 Contact with and (suspected) exposure to covid-19: Secondary | ICD-10-CM | POA: Diagnosis not present

## 2021-11-03 DIAGNOSIS — I4891 Unspecified atrial fibrillation: Secondary | ICD-10-CM | POA: Diagnosis not present

## 2021-11-03 DIAGNOSIS — Z7901 Long term (current) use of anticoagulants: Secondary | ICD-10-CM | POA: Diagnosis not present

## 2021-11-03 DIAGNOSIS — K55019 Acute (reversible) ischemia of small intestine, extent unspecified: Secondary | ICD-10-CM | POA: Diagnosis not present

## 2021-11-03 DIAGNOSIS — Z720 Tobacco use: Secondary | ICD-10-CM | POA: Diagnosis not present

## 2021-11-03 DIAGNOSIS — K55039 Acute (reversible) ischemia of large intestine, extent unspecified: Secondary | ICD-10-CM | POA: Diagnosis not present

## 2021-11-03 DIAGNOSIS — Z8679 Personal history of other diseases of the circulatory system: Secondary | ICD-10-CM | POA: Diagnosis not present

## 2021-11-03 DIAGNOSIS — W1839XA Other fall on same level, initial encounter: Secondary | ICD-10-CM | POA: Diagnosis not present

## 2021-11-03 DIAGNOSIS — Z8719 Personal history of other diseases of the digestive system: Secondary | ICD-10-CM | POA: Diagnosis not present

## 2021-11-03 DIAGNOSIS — R32 Unspecified urinary incontinence: Secondary | ICD-10-CM | POA: Diagnosis not present

## 2021-11-03 DIAGNOSIS — R5381 Other malaise: Secondary | ICD-10-CM | POA: Diagnosis not present

## 2021-11-03 DIAGNOSIS — Z7984 Long term (current) use of oral hypoglycemic drugs: Secondary | ICD-10-CM | POA: Diagnosis not present

## 2021-11-03 LAB — BASIC METABOLIC PANEL
Anion gap: 7 (ref 5–15)
BUN: 50 mg/dL — ABNORMAL HIGH (ref 8–23)
CO2: 26 mmol/L (ref 22–32)
Calcium: 8.4 mg/dL — ABNORMAL LOW (ref 8.9–10.3)
Chloride: 102 mmol/L (ref 98–111)
Creatinine, Ser: 1.34 mg/dL — ABNORMAL HIGH (ref 0.61–1.24)
GFR, Estimated: 55 mL/min — ABNORMAL LOW (ref >60)
Glucose, Bld: 113 mg/dL — ABNORMAL HIGH (ref 70–99)
Potassium: 4.1 mmol/L (ref 3.5–5.1)
Sodium: 135 mmol/L (ref 135–145)

## 2021-11-03 LAB — CBC
HCT: 39.1 % (ref 39.0–52.0)
Hemoglobin: 12.9 g/dL — ABNORMAL LOW (ref 13.0–17.0)
MCH: 31.5 pg (ref 26.0–34.0)
MCHC: 33 g/dL (ref 30.0–36.0)
MCV: 95.4 fL (ref 80.0–100.0)
Platelets: 285 10*3/uL (ref 150–400)
RBC: 4.1 MIL/uL — ABNORMAL LOW (ref 4.22–5.81)
RDW: 14.1 % (ref 11.5–15.5)
WBC: 9.1 10*3/uL (ref 4.0–10.5)
nRBC: 0 % (ref 0.0–0.2)

## 2021-11-03 LAB — URINALYSIS, ROUTINE W REFLEX MICROSCOPIC
Bilirubin Urine: NEGATIVE
Glucose, UA: NEGATIVE mg/dL
Hgb urine dipstick: NEGATIVE
Ketones, ur: NEGATIVE mg/dL
Leukocytes,Ua: NEGATIVE
Nitrite: NEGATIVE
Protein, ur: 30 mg/dL — AB
Specific Gravity, Urine: 1.02 (ref 1.005–1.030)
pH: 5.5 (ref 5.0–8.0)

## 2021-11-03 LAB — URINALYSIS, MICROSCOPIC (REFLEX): Bacteria, UA: NONE SEEN

## 2021-11-03 MED ORDER — ACETAMINOPHEN 500 MG PO TABS: 1000.0000 mg | ORAL_TABLET | Freq: Once | ORAL | Status: DC

## 2021-11-03 NOTE — ED Notes (Signed)
Per wife, taking pt to [facility] to see surgeon.

## 2021-11-03 NOTE — ED Notes (Signed)
This RN called to triage. Pt states "he went home" This RN asked if he wants to be seen or go home. Pt states that we would not be able to help him. Explained that if would not wish to be seen at this time. Pt states he was to leave. Pt states he wants to call him back when his wife gets here.

## 2021-11-03 NOTE — ED Triage Notes (Signed)
Pt via EMS from home. Pt c/o generalized weakness. Pt is now unable to stand. Pt does have a inguinal hernia that now is bothering him for past couple weeks. Per wife, pt has been having NVD. Pt is A&OX4 and NAD.

## 2021-11-04 DIAGNOSIS — Z8719 Personal history of other diseases of the digestive system: Secondary | ICD-10-CM | POA: Insufficient documentation

## 2021-11-04 NOTE — Telephone Encounter (Signed)
Refill request

## 2021-11-10 ENCOUNTER — Encounter: Payer: Self-pay | Admitting: Family Medicine

## 2021-11-11 ENCOUNTER — Telehealth: Payer: Self-pay | Admitting: Family Medicine

## 2021-11-11 NOTE — Telephone Encounter (Signed)
Spoke with pt's wife, [name] (on dpr) asking about request.  States she was initially by case worker that pt did not qualify for [facility].  However, she spoke with Medicare and was told pt does qualify for [facility] and they would need order from specialist or PCP.  [name] says she reached out to surgeon and is waiting to hear back from them today but wanted to ask Dr. Darnell Level too, in case they [surgeon] can't help.  She will call us back if she needs order from Dr. Darnell Level.  I asked about a hosp f/u with Dr. Darnell Level, but states they only mentioned for pt to f/u with surgeon.  F/u with surgeon on 11/22/21.  Says pt is feeling weak all over and HR is 94-100, his normal is high 40s-mid 50s. Fyi to Dr. Darnell Level.

## 2021-11-11 NOTE — Telephone Encounter (Signed)
Pt wife called stating that  pt is still feeling weak. Pt wife states that pt was discharged from [facility] on 11/09/21. Pt wife would like orders for Home Health for pt. Please advise.

## 2021-11-12 DIAGNOSIS — R0600 Dyspnea, unspecified: Secondary | ICD-10-CM | POA: Diagnosis not present

## 2021-11-12 DIAGNOSIS — R918 Other nonspecific abnormal finding of lung field: Secondary | ICD-10-CM | POA: Diagnosis not present

## 2021-11-12 DIAGNOSIS — I480 Paroxysmal atrial fibrillation: Secondary | ICD-10-CM | POA: Diagnosis not present

## 2021-11-12 DIAGNOSIS — E119 Type 2 diabetes mellitus without complications: Secondary | ICD-10-CM | POA: Diagnosis not present

## 2021-11-12 DIAGNOSIS — R0989 Other specified symptoms and signs involving the circulatory and respiratory systems: Secondary | ICD-10-CM | POA: Diagnosis not present

## 2021-11-12 DIAGNOSIS — K521 Toxic gastroenteritis and colitis: Secondary | ICD-10-CM | POA: Diagnosis not present

## 2021-11-12 DIAGNOSIS — Z79899 Other long term (current) drug therapy: Secondary | ICD-10-CM | POA: Diagnosis not present

## 2021-11-12 DIAGNOSIS — I5033 Acute on chronic diastolic (congestive) heart failure: Secondary | ICD-10-CM | POA: Diagnosis not present

## 2021-11-12 DIAGNOSIS — R5383 Other fatigue: Secondary | ICD-10-CM | POA: Diagnosis not present

## 2021-11-12 DIAGNOSIS — J449 Chronic obstructive pulmonary disease, unspecified: Secondary | ICD-10-CM | POA: Diagnosis not present

## 2021-11-12 DIAGNOSIS — J9621 Acute and chronic respiratory failure with hypoxia: Secondary | ICD-10-CM | POA: Diagnosis not present

## 2021-11-12 DIAGNOSIS — I4891 Unspecified atrial fibrillation: Secondary | ICD-10-CM | POA: Diagnosis not present

## 2021-11-12 DIAGNOSIS — J9811 Atelectasis: Secondary | ICD-10-CM | POA: Diagnosis not present

## 2021-11-12 DIAGNOSIS — R197 Diarrhea, unspecified: Secondary | ICD-10-CM | POA: Diagnosis not present

## 2021-11-12 DIAGNOSIS — Z20822 Contact with and (suspected) exposure to covid-19: Secondary | ICD-10-CM | POA: Diagnosis not present

## 2021-11-12 DIAGNOSIS — L02214 Cutaneous abscess of groin: Secondary | ICD-10-CM | POA: Diagnosis not present

## 2021-11-12 DIAGNOSIS — K219 Gastro-esophageal reflux disease without esophagitis: Secondary | ICD-10-CM | POA: Diagnosis not present

## 2021-11-12 DIAGNOSIS — R1031 Right lower quadrant pain: Secondary | ICD-10-CM | POA: Diagnosis not present

## 2021-11-12 DIAGNOSIS — F17200 Nicotine dependence, unspecified, uncomplicated: Secondary | ICD-10-CM | POA: Diagnosis not present

## 2021-11-12 DIAGNOSIS — Z95828 Presence of other vascular implants and grafts: Secondary | ICD-10-CM | POA: Diagnosis not present

## 2021-11-12 DIAGNOSIS — N433 Hydrocele, unspecified: Secondary | ICD-10-CM | POA: Diagnosis not present

## 2021-11-12 DIAGNOSIS — I447 Left bundle-branch block, unspecified: Secondary | ICD-10-CM | POA: Diagnosis not present

## 2021-11-12 DIAGNOSIS — R0602 Shortness of breath: Secondary | ICD-10-CM | POA: Diagnosis not present

## 2021-11-12 DIAGNOSIS — T8143XA Infection following a procedure, organ and space surgical site, initial encounter: Secondary | ICD-10-CM | POA: Diagnosis not present

## 2021-11-12 DIAGNOSIS — Z9981 Dependence on supplemental oxygen: Secondary | ICD-10-CM | POA: Diagnosis not present

## 2021-11-12 DIAGNOSIS — R001 Bradycardia, unspecified: Secondary | ICD-10-CM | POA: Diagnosis not present

## 2021-11-12 DIAGNOSIS — E873 Alkalosis: Secondary | ICD-10-CM | POA: Diagnosis not present

## 2021-11-12 DIAGNOSIS — R531 Weakness: Secondary | ICD-10-CM | POA: Diagnosis not present

## 2021-11-12 DIAGNOSIS — D72829 Elevated white blood cell count, unspecified: Secondary | ICD-10-CM | POA: Diagnosis not present

## 2021-11-12 DIAGNOSIS — I110 Hypertensive heart disease with heart failure: Secondary | ICD-10-CM | POA: Diagnosis not present

## 2021-11-12 DIAGNOSIS — J90 Pleural effusion, not elsewhere classified: Secondary | ICD-10-CM | POA: Diagnosis not present

## 2021-11-12 DIAGNOSIS — Z7901 Long term (current) use of anticoagulants: Secondary | ICD-10-CM | POA: Diagnosis not present

## 2021-11-12 DIAGNOSIS — E785 Hyperlipidemia, unspecified: Secondary | ICD-10-CM | POA: Diagnosis not present

## 2021-11-12 DIAGNOSIS — Z4682 Encounter for fitting and adjustment of non-vascular catheter: Secondary | ICD-10-CM | POA: Diagnosis not present

## 2021-11-12 DIAGNOSIS — Z7984 Long term (current) use of oral hypoglycemic drugs: Secondary | ICD-10-CM | POA: Diagnosis not present

## 2021-11-12 DIAGNOSIS — T3695XA Adverse effect of unspecified systemic antibiotic, initial encounter: Secondary | ICD-10-CM | POA: Diagnosis not present

## 2021-11-12 DIAGNOSIS — R110 Nausea: Secondary | ICD-10-CM | POA: Diagnosis not present

## 2021-11-12 DIAGNOSIS — Z5320 Procedure and treatment not carried out because of patient's decision for unspecified reasons: Secondary | ICD-10-CM | POA: Diagnosis not present

## 2021-11-12 DIAGNOSIS — G4733 Obstructive sleep apnea (adult) (pediatric): Secondary | ICD-10-CM | POA: Diagnosis not present

## 2021-11-12 DIAGNOSIS — G8929 Other chronic pain: Secondary | ICD-10-CM | POA: Diagnosis not present

## 2021-11-12 DIAGNOSIS — I77810 Thoracic aortic ectasia: Secondary | ICD-10-CM | POA: Diagnosis not present

## 2021-11-19 DIAGNOSIS — Z8719 Personal history of other diseases of the digestive system: Secondary | ICD-10-CM | POA: Diagnosis not present

## 2021-11-19 DIAGNOSIS — I110 Hypertensive heart disease with heart failure: Secondary | ICD-10-CM | POA: Diagnosis not present

## 2021-11-19 DIAGNOSIS — G4733 Obstructive sleep apnea (adult) (pediatric): Secondary | ICD-10-CM | POA: Diagnosis not present

## 2021-11-19 DIAGNOSIS — J984 Other disorders of lung: Secondary | ICD-10-CM | POA: Diagnosis not present

## 2021-11-19 DIAGNOSIS — M5030 Other cervical disc degeneration, unspecified cervical region: Secondary | ICD-10-CM | POA: Diagnosis not present

## 2021-11-19 DIAGNOSIS — Z9981 Dependence on supplemental oxygen: Secondary | ICD-10-CM | POA: Diagnosis not present

## 2021-11-19 DIAGNOSIS — T8141XD Infection following a procedure, superficial incisional surgical site, subsequent encounter: Secondary | ICD-10-CM | POA: Diagnosis not present

## 2021-11-19 DIAGNOSIS — E785 Hyperlipidemia, unspecified: Secondary | ICD-10-CM | POA: Diagnosis not present

## 2021-11-19 DIAGNOSIS — L02214 Cutaneous abscess of groin: Secondary | ICD-10-CM | POA: Diagnosis not present

## 2021-11-19 DIAGNOSIS — Z9049 Acquired absence of other specified parts of digestive tract: Secondary | ICD-10-CM | POA: Diagnosis not present

## 2021-11-19 DIAGNOSIS — I509 Heart failure, unspecified: Secondary | ICD-10-CM | POA: Diagnosis not present

## 2021-11-19 DIAGNOSIS — Z7901 Long term (current) use of anticoagulants: Secondary | ICD-10-CM | POA: Diagnosis not present

## 2021-11-19 DIAGNOSIS — G8918 Other acute postprocedural pain: Secondary | ICD-10-CM | POA: Diagnosis not present

## 2021-11-19 DIAGNOSIS — J449 Chronic obstructive pulmonary disease, unspecified: Secondary | ICD-10-CM | POA: Diagnosis not present

## 2021-11-19 DIAGNOSIS — Z7982 Long term (current) use of aspirin: Secondary | ICD-10-CM | POA: Diagnosis not present

## 2021-11-19 DIAGNOSIS — Z7984 Long term (current) use of oral hypoglycemic drugs: Secondary | ICD-10-CM | POA: Diagnosis not present

## 2021-11-19 DIAGNOSIS — E1142 Type 2 diabetes mellitus with diabetic polyneuropathy: Secondary | ICD-10-CM | POA: Diagnosis not present

## 2021-11-19 DIAGNOSIS — R109 Unspecified abdominal pain: Secondary | ICD-10-CM | POA: Diagnosis not present

## 2021-11-19 DIAGNOSIS — I4891 Unspecified atrial fibrillation: Secondary | ICD-10-CM | POA: Diagnosis not present

## 2021-11-20 ENCOUNTER — Other Ambulatory Visit: Payer: Self-pay | Admitting: Family Medicine

## 2021-11-20 ENCOUNTER — Telehealth: Payer: Self-pay

## 2021-11-20 ENCOUNTER — Telehealth: Payer: Self-pay | Admitting: Family Medicine

## 2021-11-20 DIAGNOSIS — G894 Chronic pain syndrome: Secondary | ICD-10-CM

## 2021-11-20 MED ORDER — HYDROCODONE-ACETAMINOPHEN 10-325 MG PO TABS: 1.0000 | ORAL_TABLET | Freq: Four times a day (QID) | ORAL | 0 refills | Status: DC | PRN

## 2021-11-20 NOTE — Telephone Encounter (Signed)
error 

## 2021-11-20 NOTE — Telephone Encounter (Signed)
ERx 

## 2021-11-20 NOTE — Telephone Encounter (Signed)
Pt called in requesting to schedule a hospital follow up this week . Advise  Pt that there was no appointment this week pt became upset wanted me to send a message . That he need's to be seen . Please Advise # [phone]

## 2021-11-20 NOTE — Telephone Encounter (Signed)
Name of Medication: Hydrocodone-APAP Name of Pharmacy: [facility] [address] Last Fill or Written Date and Quantity: 10/10/21, #120 Last Office Visit and Type: 09/10/21, AWV Next Office Visit and Type: 12/11/21, 3 mo chronic pain f/u Last Controlled Substance Agreement Date: 02/03/18 Last UDS: 07/06/17

## 2021-11-20 NOTE — Telephone Encounter (Signed)
Please schedule hospital f/u for Friday at 12pm.  I'm unable to schedule this.

## 2021-11-21 NOTE — Telephone Encounter (Signed)
Called an scheduled pt, he is aware that we are at [facility]

## 2021-11-22 ENCOUNTER — Inpatient Hospital Stay: Payer: Medicare Other | Admitting: Family Medicine

## 2021-11-22 ENCOUNTER — Telehealth: Payer: Self-pay

## 2021-11-22 DIAGNOSIS — Z7901 Long term (current) use of anticoagulants: Secondary | ICD-10-CM

## 2021-11-22 DIAGNOSIS — Z79899 Other long term (current) drug therapy: Secondary | ICD-10-CM | POA: Diagnosis not present

## 2021-11-22 DIAGNOSIS — Z8719 Personal history of other diseases of the digestive system: Secondary | ICD-10-CM | POA: Diagnosis not present

## 2021-11-22 DIAGNOSIS — I4811 Longstanding persistent atrial fibrillation: Secondary | ICD-10-CM | POA: Diagnosis not present

## 2021-11-22 DIAGNOSIS — Z9889 Other specified postprocedural states: Secondary | ICD-10-CM | POA: Diagnosis not present

## 2021-11-22 NOTE — Telephone Encounter (Signed)
Received call from Karlyne Greenspan, RN at [facility], reporting pt has had recent surgery and was d/c about one week ago. Pt was at her facility today and has an INR of 4.4 and they had to place pt on abx, flagyl and bactrim. She advised pt to hold dose for 11/17 and 11/18 and then have INR rechecked on 11/19. Advised RN that this nurse does not dose his coumadin, he sees cardiology for coumadin dosing. Advised this nurse will f/u with pt and cardiology to get him rechecked on Mon. Cardiology coumadin nurse is only in on Wednesdays. PCP said he is seeing pt next week and it is ok if pt needs lab. This nurse will be out of the office next week.   PCP will receive result from lab INR on Monday. Pt will f/u with cardiology on Mon. Pt has hospital f/u on Wed with PCP.  Added lab INR order.

## 2021-11-25 ENCOUNTER — Other Ambulatory Visit: Payer: Self-pay

## 2021-11-25 ENCOUNTER — Other Ambulatory Visit (INDEPENDENT_AMBULATORY_CARE_PROVIDER_SITE_OTHER): Payer: Medicare Other

## 2021-11-25 DIAGNOSIS — Z7901 Long term (current) use of anticoagulants: Secondary | ICD-10-CM | POA: Diagnosis not present

## 2021-11-25 LAB — POCT INR: INR: 3.6 — AB (ref 2.0–3.0)

## 2021-11-25 NOTE — Addendum Note (Signed)
Addended by: Leeanne Rio on: 11/25/2021 10:16 AM   Modules accepted: Orders

## 2021-11-25 NOTE — Progress Notes (Addendum)
Pt came to [facility] to have PT/INR. He wanted the in office test. They do not do that here. I perform POC PT/INR for him in the office in Beverly Branham's absence.  He states he has been off of warfarin since Friday as directed. He could not tell me his usual dosing prior to holding med on Friday.

## 2021-11-26 DIAGNOSIS — G8918 Other acute postprocedural pain: Secondary | ICD-10-CM | POA: Diagnosis not present

## 2021-11-26 DIAGNOSIS — E1142 Type 2 diabetes mellitus with diabetic polyneuropathy: Secondary | ICD-10-CM | POA: Diagnosis not present

## 2021-11-26 DIAGNOSIS — R109 Unspecified abdominal pain: Secondary | ICD-10-CM | POA: Diagnosis not present

## 2021-11-26 DIAGNOSIS — I4891 Unspecified atrial fibrillation: Secondary | ICD-10-CM | POA: Diagnosis not present

## 2021-11-26 DIAGNOSIS — L02214 Cutaneous abscess of groin: Secondary | ICD-10-CM | POA: Diagnosis not present

## 2021-11-26 DIAGNOSIS — T8141XD Infection following a procedure, superficial incisional surgical site, subsequent encounter: Secondary | ICD-10-CM | POA: Diagnosis not present

## 2021-11-27 ENCOUNTER — Other Ambulatory Visit: Payer: Self-pay

## 2021-11-27 ENCOUNTER — Ambulatory Visit (INDEPENDENT_AMBULATORY_CARE_PROVIDER_SITE_OTHER): Payer: Medicare Other | Admitting: Family Medicine

## 2021-11-27 ENCOUNTER — Encounter: Payer: Self-pay | Admitting: Family Medicine

## 2021-11-27 VITALS — BP 112/62 | HR 84 | Temp 97.4°F | Ht 73.0 in | Wt 189.6 lb

## 2021-11-27 DIAGNOSIS — I5032 Chronic diastolic (congestive) heart failure: Secondary | ICD-10-CM | POA: Diagnosis not present

## 2021-11-27 DIAGNOSIS — T148XXA Other injury of unspecified body region, initial encounter: Secondary | ICD-10-CM

## 2021-11-27 DIAGNOSIS — Z8719 Personal history of other diseases of the digestive system: Secondary | ICD-10-CM | POA: Diagnosis not present

## 2021-11-27 DIAGNOSIS — I25118 Atherosclerotic heart disease of native coronary artery with other forms of angina pectoris: Secondary | ICD-10-CM

## 2021-11-27 DIAGNOSIS — E1140 Type 2 diabetes mellitus with diabetic neuropathy, unspecified: Secondary | ICD-10-CM

## 2021-11-27 DIAGNOSIS — Z7901 Long term (current) use of anticoagulants: Secondary | ICD-10-CM | POA: Diagnosis not present

## 2021-11-27 DIAGNOSIS — R634 Abnormal weight loss: Secondary | ICD-10-CM

## 2021-11-27 DIAGNOSIS — I4891 Unspecified atrial fibrillation: Secondary | ICD-10-CM

## 2021-11-27 DIAGNOSIS — R251 Tremor, unspecified: Secondary | ICD-10-CM | POA: Diagnosis not present

## 2021-11-27 DIAGNOSIS — Z9889 Other specified postprocedural states: Secondary | ICD-10-CM | POA: Diagnosis not present

## 2021-11-27 LAB — PROTIME-INR
INR: 1.9 ratio — ABNORMAL HIGH (ref 0.8–1.0)
Prothrombin Time: 20.5 s — ABNORMAL HIGH (ref 9.6–13.1)

## 2021-11-27 MED ORDER — ASPIRIN 81 MG PO TBEC: 81.0000 mg | DELAYED_RELEASE_TABLET | Freq: Every day | ORAL | Status: AC

## 2021-11-27 NOTE — Progress Notes (Signed)
Patient ID: [name], male    DOB: [birth year], 75 y.o.   MRN: [id number]  This visit was conducted in person.  BP 112/62    Pulse 84    Temp (!) 97.4 F (36.3 C) (Temporal)    Ht _0  (1.854 m)    Wt 189 lb 9 oz (86 kg)    SpO2 96%    BMI 25.01 kg/m    CC: hosp f/u visit  Subjective:   HPI: [name] is a 75 y.o. male presenting on 11/27/2021 for Hospitalization Follow-up (Admitted on 11/12/21, dx Afib.  Pt accompanied by wife, [name]. )   Recent hospitalization for emergent hernia repair with ileocecectomy for strangulated recurrent R inguinal hernia (11/03/2021). Surgery complicated by R groin abscess and hypoxia due to CHF exacerbation with bilateral pleural effusions (re-hospitalized 11/12/2021) - treated with IR drainage and IV antibiotics, discharged on oral antibiotic course (bactrim and flagyl). Last saw gen surgery in f/u 11/22/2021 with reassuring evaluation. Discharged on lasix 41m daily and spironolactone 12.587mdaily - however not taking this.   Recent INR supratherapeutic to 4.4 last week at surgeon's office, coumadin was held. Recheck on Monday INR 3.6, coumadin was held another day and he took 2.46m48mesterday. Normal regimen is 2.46mg58mily with 3.746mg56m.   [facility] H[facility] 10, plt 500, Cr 1.2, eGFR 62.   Wife changed dressing this morning - area still draining.   Since home has been feeling tired and weak.  No dizziness, headache, chest pain, shortness of breath, leg swelling.  Pronounced tremor.  Significant weight loss since last seen. Appetite down.   He takes most meds PRN including metformin, diltiazem, midodrine. Also takes lasix 20mg 38m      Relevant past medical, surgical, family and social history reviewed and updated as indicated. Interim medical history since our last visit reviewed. Allergies and medications reviewed and updated. Outpatient Medications Prior to Visit  Medication Sig Dispense Refill   albuterol  (VENTOLIN HFA) 108 (90 Base) MCG/ACT inhaler INHALE 2 PUFFS INTO THE LUNGS EVERY 6 HOURS AS NEEDED FOR WHEEZING OR SHORTNESS OF BREATH 8.5 g 3   amiodarone (PACERONE) 200 MG tablet Take 200 mg by mouth daily.     Budeson-Glycopyrrol-Formoterol (BREZTRI AEROSPHERE) 160-9-4.8 MCG/ACT AERO Inhale 2 puffs into the lungs in the morning and at bedtime. 5.9 g 0   Budeson-Glycopyrrol-Formoterol (BREZTRI AEROSPHERE) 160-9-4.8 MCG/ACT AERO Inhale 2 puffs into the lungs in the morning and at bedtime. 10.7 g 0   Cholecalciferol (VITAMIN D) 50 MCG (2000 UT) CAPS Take 1 capsule (2,000 Units total) by mouth daily. 30 capsule    Coenzyme Q10 (COQ-10 PO) Take 1 tablet by mouth daily.     Cyanocobalamin (B-12) 1000 MCG SUBL Place 1 tablet under the tongue daily.     diclofenac Sodium (VOLTAREN) 1 % GEL Apply 4 g topically 3 (three) times daily. (Patient taking differently: Apply 4 g topically 3 (three) times daily. As needed) 100 g 3   diltiazem (CARDIZEM) 30 MG tablet Take 1 tablet (30 mg total) by mouth daily as needed (fast heart rate).     diphenoxylate-atropine (LOMOTIL) 2.5-0.025 MG tablet Take 1 tablet by mouth 2 (two) times daily as needed. 20 tablet 1   fish oil-omega-3 fatty acids 1000 MG capsule Take 2 g by mouth daily.     furosemide (LASIX) 20 MG tablet TAKE 1 TABLET BY MOUTH EVERY DAY AS NEEDED FOR FLUID RETENTION 90  tablet 0   gabapentin (NEURONTIN) 600 MG tablet Take 1 tablet (600 mg total) by mouth 2 (two) times daily.     HYDROcodone-acetaminophen (NORCO) 10-325 MG tablet Take 1 tablet by mouth every 6 (six) hours as needed for moderate pain or severe pain. 120 tablet 0   ipratropium-albuterol (DUONEB) 0.5-2.5 (3) MG/3ML SOLN Take 3 mLs by nebulization every 6 (six) hours as needed. 360 mL 11   metFORMIN (GLUCOPHAGE-XR) 500 MG 24 hr tablet TAKE 2 TABLETS(1000 MG) BY MOUTH DAILY WITH BREAKFAST 180 tablet 0   methocarbamol (ROBAXIN) 500 MG tablet TAKE 1 TABLET(500 MG) BY MOUTH AT BEDTIME 90 tablet 1    metoprolol tartrate (LOPRESSOR) 25 MG tablet Take 1 tablet (25 mg total) by mouth 2 (two) times daily as needed (fast heart rate).     midodrine (PROAMATINE) 10 MG tablet Take 1 tablet (10 mg total) by mouth 3 (three) times daily as needed. 90 tablet 1   nitroGLYCERIN (NITROLINGUAL) 0.4 MG/SPRAY spray USE 1 SPRAY AS DIRECTED EVERY 5 MINUTES AS NEEDED 4.9 g 1   omeprazole (PRILOSEC) 40 MG capsule Take 1 capsule (40 mg total) by mouth daily. 90 capsule 3   pravastatin (PRAVACHOL) 20 MG tablet TAKE 1 TABLET(20 MG) BY MOUTH DAILY 90 tablet 3   warfarin (COUMADIN) 2.5 MG tablet TAKE AS DIRECTED AFTER ANTI-COAG CLINIC 135 tablet 1   amLODipine (NORVASC) 5 MG tablet Take 5 mg by mouth as needed.     cephALEXin (KEFLEX) 500 MG capsule Take 1 capsule (500 mg total) by mouth 3 (three) times daily. 15 capsule 0   gabapentin (NEURONTIN) 600 MG tablet Take 1 tablet (600 mg total) by mouth at bedtime.     varenicline (CHANTIX CONTINUING MONTH PAK) 1 MG tablet Take 1 tablet (1 mg total) by mouth 2 (two) times daily. 60 tablet 1   varenicline (CHANTIX STARTING MONTH PAK) 0.5 MG X 11 & 1 MG X 42 tablet Take one 0.5 mg tablet by mouth once daily for 3 days, then increase to one 0.5 mg tablet twice daily for 4 days, then increase to one 1 mg tablet twice daily. 53 tablet 0   No facility-administered medications prior to visit.     Per HPI unless specifically indicated in ROS section below Review of Systems  Objective:  BP 112/62    Pulse 84    Temp (!) 97.4 F (36.3 C) (Temporal)    Ht _0  (1.854 m)    Wt 189 lb 9 oz (86 kg)    SpO2 96%    BMI 25.01 kg/m   Wt Readings from Last 3 Encounters:  11/27/21 189 lb 9 oz (86 kg)  11/03/21 195 lb (88.5 kg)  09/10/21 216 lb (98 kg)      Physical Exam Vitals and nursing note reviewed.  Constitutional:      Appearance: Normal appearance.     Comments: Tired appearing, sitting in wheelchair  Cardiovascular:     Rate and Rhythm: Normal rate. Rhythm irregular.      Pulses: Normal pulses.     Heart sounds: No murmur heard.    Comments: Distant heart sounds Pulmonary:     Effort: Pulmonary effort is normal. No respiratory distress.     Breath sounds: Normal breath sounds. No wheezing, rhonchi or rales.  Abdominal:     General: Bowel sounds are normal.     Palpations: Abdomen is soft.     Tenderness: There is no abdominal tenderness. There is no guarding  or rebound.       Comments: Bandages overlying abdomen removed, there are two separate incisions with drainage (R>central) without surrounding erythema, induration or pus  Musculoskeletal:     Right lower leg: No edema.     Left lower leg: No edema.  Skin:    General: Skin is warm and dry.     Findings: No rash.  Neurological:     Mental Status: He is alert.      Results for orders placed or performed in visit on 11/27/21  Protime-INR  Result Value Ref Range   INR 1.9 (H) 0.8 - 1.0 ratio   Prothrombin Time 20.5 (H) 9.6 - 13.1 sec   *Note: Due to a large number of results and/or encounters for the requested time period, some results have not been displayed. A complete set of results can be found in Results Review.    Assessment & Plan:  This visit occurred during the SARS-CoV-2 public health emergency.  Safety protocols were in place, including screening questions prior to the visit, additional usage of staff PPE, and extensive cleaning of exam room while observing appropriate contact time as indicated for disinfecting solutions.   Problem List Items Addressed This Visit     Atrial fibrillation ([place])    Sounds irregular today but seems rate controlled. He looks tired but denies feeling ill.  Continue amiodarone.  Last cardiology note 06/2021 mention of diltiazem and metoprolol to use PRN tachycardia/RVR.       Relevant Medications   aspirin 81 MG EC tablet   diltiazem (CARDIZEM) 30 MG tablet   metoprolol tartrate (LOPRESSOR) 25 MG tablet   Type 2 diabetes, controlled, with neuropathy  (HCC)    Using metformin PRN.       Relevant Medications   aspirin 81 MG EC tablet   Unexplained weight loss    Again weight loss noted - lowest he's been.  Attributes to no appetite after recent surgery and infection.  Discussed importance of good nutrition for wound healing. Discussed glucerna use - he declines.  Continue to monitor.       Current use of long term anticoagulation (Coumadin)    Recommended labwork today - he declines. Agrees to INR check which I have ordered STAT.  Recent supratherapeutic levels likely largely attributable to recent antibiotics, would anticipate levels to normalize now off antibiotics.       Relevant Orders   Protime-INR (Completed)   CHF (congestive heart failure) ([place])    Recent rehospitalization for CHF exacerbation with hypoxia and groin abscess after inguinal hernia repair. Seems euvolemic today although heart beat is erratic. Continue lasix which he states he's taking PRN.  Latest echo while hospitalized 11/2021 - normal LV systolic function.       Relevant Medications   aspirin 81 MG EC tablet   diltiazem (CARDIZEM) 30 MG tablet   metoprolol tartrate (LOPRESSOR) 25 MG tablet   Tremor    Acute worsening after recent surgical intervention and subsequent complications and now recovery.       History of right inguinal hernia repair - Primary    Recovering after recent emergent R hernia repair, complicated by groin abscess and CHF exacerbation.       Surgical wound present    2 wounds healing by second intention after recent groin abscess, with small amount of drainage. Drain has been removed by surgery, and he has surgical f/u planned for next week. No signs of ongoing infection.  Meds ordered this encounter  Medications   aspirin 81 MG EC tablet    Sig: Take 1 tablet (81 mg total) by mouth daily. Swallow whole.    Started by vascular surgery   Orders Placed This Encounter  Procedures   Protime-INR     Patient  Instructions  Coumadin checked today. We will be in touch with results. Work on nutrition to help wounds heal.  Continue current medicines.    Follow up plan: Return if symptoms worsen or fail to improve.  Ria Bush, MD

## 2021-11-27 NOTE — Patient Instructions (Addendum)
Coumadin checked today. We will be in touch with results. Work on nutrition to help wounds heal.  Continue current medicines.

## 2021-11-28 ENCOUNTER — Telehealth: Payer: Self-pay | Admitting: Family Medicine

## 2021-11-28 ENCOUNTER — Encounter: Payer: Self-pay | Admitting: Family Medicine

## 2021-11-28 ENCOUNTER — Telehealth: Payer: Self-pay | Admitting: *Deleted

## 2021-11-28 DIAGNOSIS — T148XXA Other injury of unspecified body region, initial encounter: Secondary | ICD-10-CM | POA: Insufficient documentation

## 2021-11-28 NOTE — Assessment & Plan Note (Signed)
Acute worsening after recent surgical intervention and subsequent complications and now recovery.

## 2021-11-28 NOTE — Assessment & Plan Note (Signed)
Using metformin PRN.

## 2021-11-28 NOTE — Assessment & Plan Note (Addendum)
2 wounds healing by second intention after recent groin abscess, with small amount of drainage. Drain has been removed by surgery, and he has surgical f/u planned for next week. No signs of ongoing infection.

## 2021-11-28 NOTE — Telephone Encounter (Signed)
Called pt and spoke with his wife. She states they received the message last night from the physician and have resumed warfarin per message recommendations. Also, she made an appt for the pt in [place] [facility].

## 2021-11-28 NOTE — Telephone Encounter (Signed)
-----   Message from Ria Bush, MD sent at 11/28/2021  9:19 AM EST ----- Results released via MyChart Late entry - called pt at 6pm last night, left message - recommend restart regular dosing of 2.5mg  (1 tab) daily, 3.75mg  (1.5 tabs) MWF.  Please call to confirm above, recommend recheck INR in 1 week with cardiology coumadin clinic.  Will also forward to [facility] and [employer] as fyi.

## 2021-11-28 NOTE — Assessment & Plan Note (Signed)
Recovering after recent emergent R hernia repair, complicated by groin abscess and CHF exacerbation.

## 2021-11-28 NOTE — Assessment & Plan Note (Addendum)
Recent rehospitalization for CHF exacerbation with hypoxia and groin abscess after inguinal hernia repair. Seems euvolemic today although heart beat is erratic. Continue lasix which he states he's taking PRN.  Latest echo while hospitalized 11/2021 - normal LV systolic function.

## 2021-11-28 NOTE — Assessment & Plan Note (Addendum)
Again weight loss noted - lowest he's been.  Attributes to no appetite after recent surgery and infection.  Discussed importance of good nutrition for wound healing. Discussed glucerna use - he declines.  Continue to monitor.

## 2021-11-28 NOTE — Assessment & Plan Note (Signed)
Recommended labwork today - he declines. Agrees to INR check which I have ordered STAT.  Recent supratherapeutic levels likely largely attributable to recent antibiotics, would anticipate levels to normalize now off antibiotics.

## 2021-11-28 NOTE — Assessment & Plan Note (Addendum)
Sounds irregular today but seems rate controlled. He looks tired but denies feeling ill.  Continue amiodarone.  Last cardiology note 06/2021 mention of diltiazem and metoprolol to use PRN tachycardia/RVR.

## 2021-11-28 NOTE — Telephone Encounter (Signed)
[name] from [place] is wanting Dr. Darnell Level to know-pt cancelled his PT appointment today 11/28/21, because he is not feeling well today.

## 2021-11-29 DIAGNOSIS — E1142 Type 2 diabetes mellitus with diabetic polyneuropathy: Secondary | ICD-10-CM | POA: Diagnosis not present

## 2021-11-29 DIAGNOSIS — Z9049 Acquired absence of other specified parts of digestive tract: Secondary | ICD-10-CM

## 2021-11-29 DIAGNOSIS — J984 Other disorders of lung: Secondary | ICD-10-CM | POA: Diagnosis not present

## 2021-11-29 DIAGNOSIS — E509 Vitamin A deficiency, unspecified: Secondary | ICD-10-CM | POA: Diagnosis not present

## 2021-11-29 DIAGNOSIS — M5030 Other cervical disc degeneration, unspecified cervical region: Secondary | ICD-10-CM | POA: Diagnosis not present

## 2021-11-29 DIAGNOSIS — R109 Unspecified abdominal pain: Secondary | ICD-10-CM | POA: Diagnosis not present

## 2021-11-29 DIAGNOSIS — Z9981 Dependence on supplemental oxygen: Secondary | ICD-10-CM

## 2021-11-29 DIAGNOSIS — Z7984 Long term (current) use of oral hypoglycemic drugs: Secondary | ICD-10-CM

## 2021-11-29 DIAGNOSIS — J449 Chronic obstructive pulmonary disease, unspecified: Secondary | ICD-10-CM | POA: Diagnosis not present

## 2021-11-29 DIAGNOSIS — Z7982 Long term (current) use of aspirin: Secondary | ICD-10-CM

## 2021-11-29 DIAGNOSIS — G4733 Obstructive sleep apnea (adult) (pediatric): Secondary | ICD-10-CM | POA: Diagnosis not present

## 2021-11-29 DIAGNOSIS — I4891 Unspecified atrial fibrillation: Secondary | ICD-10-CM | POA: Diagnosis not present

## 2021-11-29 DIAGNOSIS — I110 Hypertensive heart disease with heart failure: Secondary | ICD-10-CM | POA: Diagnosis not present

## 2021-11-29 DIAGNOSIS — L02214 Cutaneous abscess of groin: Secondary | ICD-10-CM | POA: Diagnosis not present

## 2021-11-29 DIAGNOSIS — Z8719 Personal history of other diseases of the digestive system: Secondary | ICD-10-CM

## 2021-11-29 DIAGNOSIS — Z7901 Long term (current) use of anticoagulants: Secondary | ICD-10-CM

## 2021-11-29 DIAGNOSIS — T8141XD Infection following a procedure, superficial incisional surgical site, subsequent encounter: Secondary | ICD-10-CM | POA: Diagnosis not present

## 2021-11-29 DIAGNOSIS — E785 Hyperlipidemia, unspecified: Secondary | ICD-10-CM

## 2021-11-29 DIAGNOSIS — G8918 Other acute postprocedural pain: Secondary | ICD-10-CM | POA: Diagnosis not present

## 2021-12-03 DIAGNOSIS — T148XXA Other injury of unspecified body region, initial encounter: Secondary | ICD-10-CM | POA: Diagnosis not present

## 2021-12-03 DIAGNOSIS — K4090 Unilateral inguinal hernia, without obstruction or gangrene, not specified as recurrent: Secondary | ICD-10-CM | POA: Diagnosis not present

## 2021-12-04 ENCOUNTER — Other Ambulatory Visit: Payer: Self-pay

## 2021-12-04 ENCOUNTER — Ambulatory Visit (INDEPENDENT_AMBULATORY_CARE_PROVIDER_SITE_OTHER): Payer: Medicare Other

## 2021-12-04 DIAGNOSIS — T8141XD Infection following a procedure, superficial incisional surgical site, subsequent encounter: Secondary | ICD-10-CM | POA: Diagnosis not present

## 2021-12-04 DIAGNOSIS — Z5181 Encounter for therapeutic drug level monitoring: Secondary | ICD-10-CM | POA: Diagnosis not present

## 2021-12-04 DIAGNOSIS — R109 Unspecified abdominal pain: Secondary | ICD-10-CM | POA: Diagnosis not present

## 2021-12-04 DIAGNOSIS — E1142 Type 2 diabetes mellitus with diabetic polyneuropathy: Secondary | ICD-10-CM | POA: Diagnosis not present

## 2021-12-04 DIAGNOSIS — L02214 Cutaneous abscess of groin: Secondary | ICD-10-CM | POA: Diagnosis not present

## 2021-12-04 DIAGNOSIS — G8918 Other acute postprocedural pain: Secondary | ICD-10-CM | POA: Diagnosis not present

## 2021-12-04 DIAGNOSIS — I4891 Unspecified atrial fibrillation: Secondary | ICD-10-CM | POA: Diagnosis not present

## 2021-12-04 LAB — POCT INR: INR: 1.6 — AB (ref 2.0–3.0)

## 2021-12-04 NOTE — Telephone Encounter (Signed)
Noted  

## 2021-12-06 ENCOUNTER — Telehealth: Payer: Self-pay | Admitting: Family Medicine

## 2021-12-06 NOTE — Telephone Encounter (Signed)
Maurice with [facility] called stating that pt refused Physical Therapy.

## 2021-12-06 NOTE — Telephone Encounter (Signed)
Noted  

## 2021-12-10 ENCOUNTER — Telehealth: Payer: Self-pay | Admitting: Family Medicine

## 2021-12-10 DIAGNOSIS — L02214 Cutaneous abscess of groin: Secondary | ICD-10-CM | POA: Diagnosis not present

## 2021-12-10 DIAGNOSIS — E1142 Type 2 diabetes mellitus with diabetic polyneuropathy: Secondary | ICD-10-CM | POA: Diagnosis not present

## 2021-12-10 DIAGNOSIS — I4891 Unspecified atrial fibrillation: Secondary | ICD-10-CM | POA: Diagnosis not present

## 2021-12-10 DIAGNOSIS — R109 Unspecified abdominal pain: Secondary | ICD-10-CM | POA: Diagnosis not present

## 2021-12-10 DIAGNOSIS — G8918 Other acute postprocedural pain: Secondary | ICD-10-CM | POA: Diagnosis not present

## 2021-12-10 DIAGNOSIS — T8141XD Infection following a procedure, superficial incisional surgical site, subsequent encounter: Secondary | ICD-10-CM | POA: Diagnosis not present

## 2021-12-10 NOTE — Telephone Encounter (Signed)
[name], [place] Phone: [phone]  Wanted to inform the provider that the Patient fell on 1.2.23, Monday he hit his back and head Denies headache,took about 2 people to help him up

## 2021-12-11 ENCOUNTER — Other Ambulatory Visit: Payer: Self-pay

## 2021-12-11 ENCOUNTER — Ambulatory Visit (INDEPENDENT_AMBULATORY_CARE_PROVIDER_SITE_OTHER): Payer: Medicare Other

## 2021-12-11 ENCOUNTER — Encounter: Payer: Self-pay | Admitting: Family Medicine

## 2021-12-11 ENCOUNTER — Ambulatory Visit (INDEPENDENT_AMBULATORY_CARE_PROVIDER_SITE_OTHER): Payer: Medicare Other | Admitting: Family Medicine

## 2021-12-11 VITALS — BP 130/82 | HR 68 | Temp 97.0°F | Ht 73.0 in | Wt 179.0 lb

## 2021-12-11 DIAGNOSIS — R2689 Other abnormalities of gait and mobility: Secondary | ICD-10-CM

## 2021-12-11 DIAGNOSIS — Z5181 Encounter for therapeutic drug level monitoring: Secondary | ICD-10-CM

## 2021-12-11 DIAGNOSIS — M545 Low back pain, unspecified: Secondary | ICD-10-CM | POA: Diagnosis not present

## 2021-12-11 DIAGNOSIS — R197 Diarrhea, unspecified: Secondary | ICD-10-CM | POA: Diagnosis not present

## 2021-12-11 DIAGNOSIS — K4090 Unilateral inguinal hernia, without obstruction or gangrene, not specified as recurrent: Secondary | ICD-10-CM

## 2021-12-11 DIAGNOSIS — G8929 Other chronic pain: Secondary | ICD-10-CM

## 2021-12-11 DIAGNOSIS — Z7901 Long term (current) use of anticoagulants: Secondary | ICD-10-CM

## 2021-12-11 DIAGNOSIS — E1140 Type 2 diabetes mellitus with diabetic neuropathy, unspecified: Secondary | ICD-10-CM | POA: Diagnosis not present

## 2021-12-11 DIAGNOSIS — W19XXXA Unspecified fall, initial encounter: Secondary | ICD-10-CM | POA: Diagnosis not present

## 2021-12-11 DIAGNOSIS — T148XXA Other injury of unspecified body region, initial encounter: Secondary | ICD-10-CM | POA: Diagnosis not present

## 2021-12-11 DIAGNOSIS — I4891 Unspecified atrial fibrillation: Secondary | ICD-10-CM | POA: Diagnosis not present

## 2021-12-11 DIAGNOSIS — R634 Abnormal weight loss: Secondary | ICD-10-CM

## 2021-12-11 LAB — CBC WITH DIFFERENTIAL/PLATELET
Basophils Absolute: 0.1 10*3/uL (ref 0.0–0.1)
Basophils Relative: 0.4 % (ref 0.0–3.0)
Eosinophils Absolute: 0.1 10*3/uL (ref 0.0–0.7)
Eosinophils Relative: 0.4 % (ref 0.0–5.0)
HCT: 37 % — ABNORMAL LOW (ref 39.0–52.0)
Hemoglobin: 11.7 g/dL — ABNORMAL LOW (ref 13.0–17.0)
Lymphocytes Relative: 13.5 % (ref 12.0–46.0)
Lymphs Abs: 1.8 10*3/uL (ref 0.7–4.0)
MCHC: 31.7 g/dL (ref 30.0–36.0)
MCV: 93.7 fl (ref 78.0–100.0)
Monocytes Absolute: 0.9 10*3/uL (ref 0.1–1.0)
Monocytes Relative: 6.8 % (ref 3.0–12.0)
Neutro Abs: 10.2 10*3/uL — ABNORMAL HIGH (ref 1.4–7.7)
Neutrophils Relative %: 78.9 % — ABNORMAL HIGH (ref 43.0–77.0)
Platelets: 361 10*3/uL (ref 150.0–400.0)
RBC: 3.95 Mil/uL — ABNORMAL LOW (ref 4.22–5.81)
RDW: 15.5 % (ref 11.5–15.5)
WBC: 13 10*3/uL — ABNORMAL HIGH (ref 4.0–10.5)

## 2021-12-11 LAB — COMPREHENSIVE METABOLIC PANEL
ALT: 8 U/L (ref 0–53)
AST: 11 U/L (ref 0–37)
Albumin: 3.2 g/dL — ABNORMAL LOW (ref 3.5–5.2)
Alkaline Phosphatase: 69 U/L (ref 39–117)
BUN: 16 mg/dL (ref 6–23)
CO2: 29 mEq/L (ref 19–32)
Calcium: 8.7 mg/dL (ref 8.4–10.5)
Chloride: 100 mEq/L (ref 96–112)
Creatinine, Ser: 0.91 mg/dL (ref 0.40–1.50)
GFR: 81.24 mL/min (ref >60.00)
Glucose, Bld: 107 mg/dL — ABNORMAL HIGH (ref 70–99)
Potassium: 4.5 mEq/L (ref 3.5–5.1)
Sodium: 138 mEq/L (ref 135–145)
Total Bilirubin: 0.7 mg/dL (ref 0.2–1.2)
Total Protein: 6.1 g/dL (ref 6.0–8.3)

## 2021-12-11 LAB — HEMOGLOBIN A1C: Hgb A1c MFr Bld: 6.1 % (ref 4.6–6.5)

## 2021-12-11 LAB — POCT INR: INR: 1.7 — AB (ref 2.0–3.0)

## 2021-12-11 LAB — TSH: TSH: 3.24 u[IU]/mL (ref 0.35–5.50)

## 2021-12-11 MED ORDER — METFORMIN HCL ER 500 MG PO TB24
500.0000 mg | ORAL_TABLET | Freq: Every day | ORAL | 1 refills | Status: DC
Start: 2021-12-11 — End: 2023-03-11

## 2021-12-11 NOTE — Patient Instructions (Addendum)
Continue current medicines.  Continue lidocaine patches, use ice/heat to the back covered in a towel.  Let us know if not improving as expected for xrays.  Keep me updated on how wound looks via mychart.  Drop metformin to once daily.  Labs today  Return in 3 weeks for wound check.

## 2021-12-11 NOTE — Patient Instructions (Signed)
-   resume dosage of warfarin of 1 tablet every day EXCEPT 1.5 TABLETS ON MONDAYS, WEDNESDAYS & FRIDAYS - Recheck INR in 2 weeks

## 2021-12-11 NOTE — Telephone Encounter (Signed)
Noted. Seeing patient today.

## 2021-12-11 NOTE — Progress Notes (Signed)
Patient ID: [name], male    DOB: 1946-05-16, 76 y.o.   MRN: [id number]  This visit was conducted in person.  BP 130/82 (BP Location: Left Arm, Patient Position: Sitting, Cuff Size: Normal)    Pulse 68 Comment: manually checked   Temp (!) 97 F (36.1 C) (Temporal)    Ht 6\' 1"  (1.854 m)    Wt 179 lb (81.2 kg)    SpO2 92%    BMI 23.62 kg/m    CC: 3 mo f/u visit  Subjective:   HPI: [name] is a 76 y.o. male presenting on 12/11/2021 for Follow-up   See recent notes for details. Seen here 2 weeks ago after emergent R inguinal hernia repair. Groin wound continues healing - released from surgery care.   Chronic pain - continues 4 hydrocodone/day - 2 in am and 2 at night. Continues gabapentin 600mg  nightly as well as robaxin at bedtime.   Recent falls - had a fall Friday (not Monday) where he hit his back and head - fell outside his house onto gravel. No dizziness - notes more trouble with balance. No headaches but did develop knot to posterior scalp. Also has worsening lower back pain since fall. Normally uses cane to walk. Today in wheelchair.    [facility] home health involved - PT.  INR recently subtherapeutic - has f/u planned today at 2:15pm with cardiology coumadin clinic.   Ongoing diarrhea since hospitalization - going every 3 days with loose/watery stool.  Currently taking metformin 500mg  BID.      Relevant past medical, surgical, family and social history reviewed and updated as indicated. Interim medical history since our last visit reviewed. Allergies and medications reviewed and updated. Outpatient Medications Prior to Visit  Medication Sig Dispense Refill   albuterol (VENTOLIN HFA) 108 (90 Base) MCG/ACT inhaler INHALE 2 PUFFS INTO THE LUNGS EVERY 6 HOURS AS NEEDED FOR WHEEZING OR SHORTNESS OF BREATH 8.5 g 3   amiodarone (PACERONE) 200 MG tablet Take 200 mg by mouth daily.     aspirin 81 MG EC tablet Take 1 tablet (81 mg total) by mouth daily. Swallow whole.      Budeson-Glycopyrrol-Formoterol (BREZTRI AEROSPHERE) 160-9-4.8 MCG/ACT AERO Inhale 2 puffs into the lungs in the morning and at bedtime. 10.7 g 0   Cholecalciferol (VITAMIN D) 50 MCG (2000 UT) CAPS Take 1 capsule (2,000 Units total) by mouth daily. 30 capsule    Coenzyme Q10 (COQ-10 PO) Take 1 tablet by mouth daily.     Cyanocobalamin (B-12) 1000 MCG SUBL Place 1 tablet under the tongue daily.     diclofenac Sodium (VOLTAREN) 1 % GEL Apply 4 g topically 3 (three) times daily. (Patient taking differently: Apply 4 g topically 3 (three) times daily. As needed) 100 g 3   diltiazem (CARDIZEM) 30 MG tablet Take 1 tablet (30 mg total) by mouth daily as needed (fast heart rate).     diphenoxylate-atropine (LOMOTIL) 2.5-0.025 MG tablet Take 1 tablet by mouth 2 (two) times daily as needed. 20 tablet 1   fish oil-omega-3 fatty acids 1000 MG capsule Take 2 g by mouth daily.     furosemide (LASIX) 20 MG tablet TAKE 1 TABLET BY MOUTH EVERY DAY AS NEEDED FOR FLUID RETENTION 90 tablet 0   gabapentin (NEURONTIN) 600 MG tablet Take 1 tablet (600 mg total) by mouth 2 (two) times daily.     HYDROcodone-acetaminophen (NORCO) 10-325 MG tablet Take 1 tablet by mouth every 6 (six) hours as  needed for moderate pain or severe pain. 120 tablet 0   ipratropium-albuterol (DUONEB) 0.5-2.5 (3) MG/3ML SOLN Take 3 mLs by nebulization every 6 (six) hours as needed. 360 mL 11   methocarbamol (ROBAXIN) 500 MG tablet TAKE 1 TABLET(500 MG) BY MOUTH AT BEDTIME 90 tablet 1   metoprolol tartrate (LOPRESSOR) 25 MG tablet Take 1 tablet (25 mg total) by mouth 2 (two) times daily as needed (fast heart rate).     midodrine (PROAMATINE) 10 MG tablet Take 1 tablet (10 mg total) by mouth 3 (three) times daily as needed. 90 tablet 1   nitroGLYCERIN (NITROLINGUAL) 0.4 MG/SPRAY spray USE 1 SPRAY AS DIRECTED EVERY 5 MINUTES AS NEEDED 4.9 g 1   omeprazole (PRILOSEC) 40 MG capsule Take 1 capsule (40 mg total) by mouth daily. 90 capsule 3   pravastatin  (PRAVACHOL) 20 MG tablet TAKE 1 TABLET(20 MG) BY MOUTH DAILY 90 tablet 3   warfarin (COUMADIN) 2.5 MG tablet TAKE AS DIRECTED AFTER ANTI-COAG CLINIC 135 tablet 1   Budeson-Glycopyrrol-Formoterol (BREZTRI AEROSPHERE) 160-9-4.8 MCG/ACT AERO Inhale 2 puffs into the lungs in the morning and at bedtime. 5.9 g 0   metFORMIN (GLUCOPHAGE-XR) 500 MG 24 hr tablet TAKE 2 TABLETS(1000 MG) BY MOUTH DAILY WITH BREAKFAST 180 tablet 0   No facility-administered medications prior to visit.     Per HPI unless specifically indicated in ROS section below Review of Systems  Objective:  BP 130/82 (BP Location: Left Arm, Patient Position: Sitting, Cuff Size: Normal)    Pulse 68 Comment: manually checked   Temp (!) 97 F (36.1 C) (Temporal)    Ht 6\' 1"  (1.854 m)    Wt 179 lb (81.2 kg)    SpO2 92%    BMI 23.62 kg/m   Wt Readings from Last 3 Encounters:  12/11/21 179 lb (81.2 kg)  11/27/21 189 lb 9 oz (86 kg)  11/03/21 195 lb (88.5 kg)      Physical Exam Vitals and nursing note reviewed.  Constitutional:      Appearance: Normal appearance. He is ill-appearing and diaphoretic.     Comments: Sitting in wheelchair  HENT:     Head:     Comments: Small abrasion to posterior scalp Cardiovascular:     Rate and Rhythm: Normal rate and regular rhythm.     Pulses: Normal pulses.     Heart sounds: Normal heart sounds. No murmur heard. Pulmonary:     Effort: Pulmonary effort is normal. No respiratory distress.     Breath sounds: Normal breath sounds. No wheezing, rhonchi or rales.  Musculoskeletal:     Right lower leg: No edema.     Left lower leg: No edema.     Comments:  Mild discomfort midline lower lumbar spine with worse pain at lower lumbar paraspinous mm  Thin legs  Skin:    General: Skin is warm.     Findings: No erythema or rash.     Comments: Groin incisions clean, dry without erythema   Neurological:     General: No focal deficit present.     Mental Status: He is alert.     Comments:  CN 2-12  intact EOMI 5/5 strength BUE Grip strength intact  Psychiatric:        Mood and Affect: Mood normal.        Behavior: Behavior normal.      Results for orders placed or performed in visit on 12/11/21  Comprehensive metabolic panel  Result Value Ref Range  Sodium 138 135 - 145 mEq/L   Potassium 4.5 3.5 - 5.1 mEq/L   Chloride 100 96 - 112 mEq/L   CO2 29 19 - 32 mEq/L   Glucose, Bld 107 (H) 70 - 99 mg/dL   BUN 16 6 - 23 mg/dL   Creatinine, Ser 0.91 0.40 - 1.50 mg/dL   Total Bilirubin 0.7 0.2 - 1.2 mg/dL   Alkaline Phosphatase 69 39 - 117 U/L   AST 11 0 - 37 U/L   ALT 8 0 - 53 U/L   Total Protein 6.1 6.0 - 8.3 g/dL   Albumin 3.2 (L) 3.5 - 5.2 g/dL   GFR 81.24 >60.00 mL/min   Calcium 8.7 8.4 - 10.5 mg/dL  CBC with Differential/Platelet  Result Value Ref Range   WBC 13.0 (H) 4.0 - 10.5 K/uL   RBC 3.95 (L) 4.22 - 5.81 Mil/uL   Hemoglobin 11.7 (L) 13.0 - 17.0 g/dL   HCT 37.0 (L) 39.0 - 52.0 %   MCV 93.7 78.0 - 100.0 fl   MCHC 31.7 30.0 - 36.0 g/dL   RDW 15.5 11.5 - 15.5 %   Platelets 361.0 150.0 - 400.0 K/uL   Neutrophils Relative % 78.9 (H) 43.0 - 77.0 %   Lymphocytes Relative 13.5 12.0 - 46.0 %   Monocytes Relative 6.8 3.0 - 12.0 %   Eosinophils Relative 0.4 0.0 - 5.0 %   Basophils Relative 0.4 0.0 - 3.0 %   Neutro Abs 10.2 (H) 1.4 - 7.7 K/uL   Lymphs Abs 1.8 0.7 - 4.0 K/uL   Monocytes Absolute 0.9 0.1 - 1.0 K/uL   Eosinophils Absolute 0.1 0.0 - 0.7 K/uL   Basophils Absolute 0.1 0.0 - 0.1 K/uL  TSH  Result Value Ref Range   TSH 3.24 0.35 - 5.50 uIU/mL  Hemoglobin A1c  Result Value Ref Range   Hgb A1c MFr Bld 6.1 4.6 - 6.5 %   *Note: Due to a large number of results and/or encounters for the requested time period, some results have not been displayed. A complete set of results can be found in Results Review.   Lab Results  Component Value Date   PSA 0.11 09/10/2021   PSA 9.7 (H) 06/09/2018   PSA 11.51 (H) 07/16/2016     Lab Results  Component Value Date    INR 1.7 (A) 12/11/2021   INR 1.6 (A) 12/04/2021   INR 1.9 (H) 11/27/2021    Assessment & Plan:  This visit occurred during the SARS-CoV-2 public health emergency.  Safety protocols were in place, including screening questions prior to the visit, additional usage of staff PPE, and extensive cleaning of exam room while observing appropriate contact time as indicated for disinfecting solutions.   Problem List Items Addressed This Visit     Encounter for chronic pain management (Chronic)     CSRS reviewed.  Continue hydrocodone 10/325mg  #120/mo, takes 2 tabs BID.        Atrial fibrillation ([place])    Managed with amiodarone, coumadin and metoprolol. Takes BB intermittently.       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 Update A1c today. With noted unintentional weight loss, anticipate improved sugar control - will recommend he drop metformin to 500mg  once daily.       Relevant Medications   metFORMIN (GLUCOPHAGE-XR) 500 MG 24 hr tablet   Other Relevant Orders   Comprehensive metabolic panel (Completed)   CBC with Differential/Platelet (  Completed)   TSH (Completed)   Hemoglobin A1c (Completed)   Diarrhea    Notes ongoing trouble with intermittent loose stools every few days - will reassess on lower metformin dose.       Unexplained weight loss    Ongoing weight loss noted. Wife endorses he has poor appetite. He does not want to use protein supplement (ie glucerna). Encouraged good nutrition to optimize wound healing.       Current use of long term anticoagulation (Coumadin)    Upcoming coumadin clinic this afternoon.       Right inguinal hernia   Fall with injury    Fall at home last week, hurt lower back as per below, hit head without residual injury.       Imbalance    Notes ongoing difficulty with this.  Discussed medications that could contribute.  Discussed component of deconditioning.  Encouraged  he continue working with [facility] PT despite his hesitance.       Surgical wound present    Continues healing well by second intention.  Discussed home wound care. No signs of infection.  RTC 3 wks wound check.  They will keep me updated via mychart.       Acute bilateral low back pain without sciatica    New after fall at home.  Discussed supportive measures at home, continue current pain regimen of hydrocodone, gabapentin, methocarbamol.  Update if not improving as expected and we will check lumbar films.         Meds ordered this encounter  Medications   metFORMIN (GLUCOPHAGE-XR) 500 MG 24 hr tablet    Sig: Take 1 tablet (500 mg total) by mouth daily with breakfast.    Dispense:  90 tablet    Refill:  1    Note new dose   Orders Placed This Encounter  Procedures   Comprehensive metabolic panel   CBC with Differential/Platelet   TSH   Hemoglobin A1c     Patient Instructions  Continue current medicines.  Continue lidocaine patches, use ice/heat to the back covered in a towel.  Let us know if not improving as expected for xrays.  Keep me updated on how wound looks via mychart.  Drop metformin to once daily.  Labs today  Return in 3 weeks for wound check.   Follow up plan: Return in about 3 weeks (around 01/01/2022) for follow up visit.  Ria Bush, MD

## 2021-12-12 DIAGNOSIS — M545 Low back pain, unspecified: Secondary | ICD-10-CM | POA: Insufficient documentation

## 2021-12-12 NOTE — Assessment & Plan Note (Signed)
Managed with amiodarone, coumadin and metoprolol. Takes BB intermittently.

## 2021-12-12 NOTE — Assessment & Plan Note (Signed)
Notes ongoing trouble with intermittent loose stools every few days - will reassess on lower metformin dose.

## 2021-12-12 NOTE — Assessment & Plan Note (Signed)
[place] CSRS reviewed.  Continue hydrocodone 10/325mg  #120/mo, takes 2 tabs BID.

## 2021-12-12 NOTE — Assessment & Plan Note (Signed)
States he's been taking metformin 500mg  BID. Update A1c today. With noted unintentional weight loss, anticipate improved sugar control - will recommend he drop metformin to 500mg  once daily.

## 2021-12-12 NOTE — Assessment & Plan Note (Signed)
Upcoming coumadin clinic this afternoon.

## 2021-12-12 NOTE — Assessment & Plan Note (Addendum)
Notes ongoing difficulty with this.  Discussed medications that could contribute.  Discussed component of deconditioning.  Encouraged he continue working with [facility] PT despite his hesitance.

## 2021-12-12 NOTE — Assessment & Plan Note (Signed)
Fall at home last week, hurt lower back as per below, hit head without residual injury.

## 2021-12-12 NOTE — Assessment & Plan Note (Signed)
New after fall at home.  Discussed supportive measures at home, continue current pain regimen of hydrocodone, gabapentin, methocarbamol.  Update if not improving as expected and we will check lumbar films.

## 2021-12-12 NOTE — Assessment & Plan Note (Signed)
Continues healing well by second intention.  Discussed home wound care. No signs of infection.  RTC 3 wks wound check.  They will keep me updated via mychart.

## 2021-12-12 NOTE — Assessment & Plan Note (Signed)
Ongoing weight loss noted. Wife endorses he has poor appetite. He does not want to use protein supplement (ie glucerna). Encouraged good nutrition to optimize wound healing.

## 2021-12-13 DIAGNOSIS — I4891 Unspecified atrial fibrillation: Secondary | ICD-10-CM | POA: Diagnosis not present

## 2021-12-13 DIAGNOSIS — E1142 Type 2 diabetes mellitus with diabetic polyneuropathy: Secondary | ICD-10-CM | POA: Diagnosis not present

## 2021-12-13 DIAGNOSIS — L02214 Cutaneous abscess of groin: Secondary | ICD-10-CM | POA: Diagnosis not present

## 2021-12-13 DIAGNOSIS — T8141XD Infection following a procedure, superficial incisional surgical site, subsequent encounter: Secondary | ICD-10-CM | POA: Diagnosis not present

## 2021-12-13 DIAGNOSIS — R109 Unspecified abdominal pain: Secondary | ICD-10-CM | POA: Diagnosis not present

## 2021-12-13 DIAGNOSIS — G8918 Other acute postprocedural pain: Secondary | ICD-10-CM | POA: Diagnosis not present

## 2021-12-15 ENCOUNTER — Encounter: Payer: Self-pay | Admitting: Family Medicine

## 2021-12-17 ENCOUNTER — Other Ambulatory Visit: Payer: Self-pay | Admitting: Family Medicine

## 2021-12-17 ENCOUNTER — Telehealth: Payer: Self-pay | Admitting: Family Medicine

## 2021-12-17 DIAGNOSIS — G894 Chronic pain syndrome: Secondary | ICD-10-CM

## 2021-12-17 NOTE — Telephone Encounter (Signed)
Lvm asking pt's wife, [name] (on dpr), asking her to call back.  Need to relay Dr. Synthia Innocent message and get answer to his question.

## 2021-12-17 NOTE — Telephone Encounter (Signed)
Spoke with pt's wife, [name], asking about wound.  States the area is closing.  There is some white drainage from incision site below abd.  But no redness and pt has not had any fever.  She will upload pics this evening in [place].

## 2021-12-17 NOTE — Telephone Encounter (Signed)
Name of Medication: Hydrocodone Name of Pharmacy: [facility] or Written Date and Quantity: 11/20/21 #120 Last Office Visit and Type: 12/11/21 Next Office Visit and Type: 12/30/21 Last Controlled Substance Agreement Date: 02/03/18 Last UDS:07/06/17

## 2021-12-17 NOTE — Telephone Encounter (Signed)
Never received pictures - please call wife for updated - how is wound looking? His white count returned higher concerning for infection.

## 2021-12-17 NOTE — Telephone Encounter (Signed)
Noted. See mychart note.

## 2021-12-19 ENCOUNTER — Telehealth: Payer: Self-pay

## 2021-12-19 MED ORDER — HYDROCODONE-ACETAMINOPHEN 10-325 MG PO TABS: 1.0000 | ORAL_TABLET | Freq: Four times a day (QID) | ORAL | 0 refills | Status: DC | PRN

## 2021-12-19 NOTE — Chronic Care Management (AMB) (Addendum)
Chronic Care Management Pharmacy Assistant   Name: [name]  MRN: [id number] DOB: 02-Mar-1946  Reason for Encounter: Adherence Review    Recent office visits:  12/25/21-PCP-Patient presented for a fall.CT-Head ordered,chest xray,labs ordered(abnormal),start using walker at home 12/11/21-PCP-Patient presented for follow up diabetes- With noted unintentional weight loss, anticipate improved sugar control - will recommend he drop metformin to 500mg  once daily. Labs ordered (new A1c 6.1) other results normal, slightly more anemic, consider starting oral iron OTC. 11/27/21-PCP-Patient presented for follow up hospital stay-protime -INR ordered,no medication changes   Recent consult visits:  12/03/21-[facility]-Patient presented for follow up hernia repair. Suture removal,continue wound care, no medication changes  11/22/21-[facility]-Patient presented for follow up hernia repair. Wound cleaned,drain removed,prescribed Dakin's. 11/12/21- [facility]-Patient presented for right inguinal hernia repair- Bactrim and Flagyl till December 16th -discharged to home. 11/03/21-[facility]- Patient presented for increase symptoms associated with right inguinal hernia CT-Scan ordered,.Started on IV fluids ,scheduled for surgery 11/04/21- discharged to home,started on Hydromorphone 2mg  and senokot.  Hospital visits:  None in previous 6 months  Medications: Outpatient Encounter Medications as of 12/19/2021  Medication Sig   HYDROcodone-acetaminophen (NORCO) 10-325 MG tablet Take 1 tablet by mouth every 6 (six) hours as needed for moderate pain or severe pain.   albuterol (VENTOLIN HFA) 108 (90 Base) MCG/ACT inhaler INHALE 2 PUFFS INTO THE LUNGS EVERY 6 HOURS AS NEEDED FOR WHEEZING OR SHORTNESS OF BREATH   amiodarone (PACERONE) 200 MG tablet Take 200 mg by mouth daily.   aspirin 81 MG EC tablet Take 1 tablet (81 mg total) by mouth daily. Swallow whole.    Budeson-Glycopyrrol-Formoterol (BREZTRI AEROSPHERE) 160-9-4.8 MCG/ACT AERO Inhale 2 puffs into the lungs in the morning and at bedtime.   Cholecalciferol (VITAMIN D) 50 MCG (2000 UT) CAPS Take 1 capsule (2,000 Units total) by mouth daily.   Coenzyme Q10 (COQ-10 PO) Take 1 tablet by mouth daily.   Cyanocobalamin (B-12) 1000 MCG SUBL Place 1 tablet under the tongue daily.   diclofenac Sodium (VOLTAREN) 1 % GEL Apply 4 g topically 3 (three) times daily. (Patient taking differently: Apply 4 g topically 3 (three) times daily. As needed)   diltiazem (CARDIZEM) 30 MG tablet Take 1 tablet (30 mg total) by mouth daily as needed (fast heart rate).   diphenoxylate-atropine (LOMOTIL) 2.5-0.025 MG tablet Take 1 tablet by mouth 2 (two) times daily as needed.   fish oil-omega-3 fatty acids 1000 MG capsule Take 2 g by mouth daily.   furosemide (LASIX) 20 MG tablet TAKE 1 TABLET BY MOUTH EVERY DAY AS NEEDED FOR FLUID RETENTION   gabapentin (NEURONTIN) 600 MG tablet Take 1 tablet (600 mg total) by mouth 2 (two) times daily.   ipratropium-albuterol (DUONEB) 0.5-2.5 (3) MG/3ML SOLN Take 3 mLs by nebulization every 6 (six) hours as needed.   metFORMIN (GLUCOPHAGE-XR) 500 MG 24 hr tablet Take 1 tablet (500 mg total) by mouth daily with breakfast.   methocarbamol (ROBAXIN) 500 MG tablet TAKE 1 TABLET(500 MG) BY MOUTH AT BEDTIME   metoprolol tartrate (LOPRESSOR) 25 MG tablet Take 1 tablet (25 mg total) by mouth 2 (two) times daily as needed (fast heart rate).   midodrine (PROAMATINE) 10 MG tablet Take 1 tablet (10 mg total) by mouth 3 (three) times daily as needed.   nitroGLYCERIN (NITROLINGUAL) 0.4 MG/SPRAY spray USE 1 SPRAY AS DIRECTED EVERY 5 MINUTES AS NEEDED   omeprazole (PRILOSEC) 40 MG capsule Take 1 capsule (40 mg total) by mouth  daily.   pravastatin (PRAVACHOL) 20 MG tablet TAKE 1 TABLET(20 MG) BY MOUTH DAILY   warfarin (COUMADIN) 2.5 MG tablet TAKE AS DIRECTED AFTER ANTI-COAG CLINIC   No facility-administered  encounter medications on file as of 12/19/2021.    Contacted [name] on 12/26/21 for general disease state and medication adherence call. Spoke with wife [name].  Patient is not more than 5 days past due for refill on the following medications per chart history:  [facility] Medications: Medication Name/mg Last Fill Days Supply Metformin 500mg  XR  12/11/21  90 Pravastatin 20mg   10/18/21 90    What concerns do you have about your medications? No concerns at this time  The patient denies side effects with their medications.   How often do you forget or accidentally miss a dose? Never  Are you having any problems getting your medications from your pharmacy? No  Has the cost of your medications been a concern? No  Since last visit with CPP, the following interventions have been made. Notes ongoing trouble with intermittent loose stools every few days - will reassess on lower metformin dose.The wife reports BG's  within normal limits and the patient is taking Metformin XR 500mg  1 tablet daily now,  Fasting BG 12/26/21- 105  The patient has had an ED visit since last contact.  Hernia repair  with [name]. Patient is in ED during this call with complications from the hernia repair. He will be admitted per wife.  The patient reports the following problems with their health. Patient is having complications from hernia repair November 2022.  Patient denies concerns or questions for [name], PharmD at this time.   Counseled patient on:  Access to CCM team for any cost, medication or pharmacy concerns.   Care Gaps: Annual wellness visit in last year? Yes Most Recent BP reading:130/82  68-P 12/11/21    If Diabetic: Most recent A1C reading:6.1  12/11/21 Last eye exam / retinopathy screening:2021 Last diabetic foot exam:2021  Upcoming appointments: PCP appointment on 12/30/21   [name], CPP notified  [name], [place] Assistant [phone]  I have  reviewed the care management and care coordination activities outlined in this encounter and I am certifying that I agree with the content of this note. No further action required.  [name], PharmD Clinical Pharmacist [facility] [phone]

## 2021-12-19 NOTE — Telephone Encounter (Signed)
ERx 

## 2021-12-20 ENCOUNTER — Encounter: Payer: Self-pay | Admitting: Family Medicine

## 2021-12-23 DIAGNOSIS — H25813 Combined forms of age-related cataract, bilateral: Secondary | ICD-10-CM | POA: Diagnosis not present

## 2021-12-23 NOTE — Telephone Encounter (Signed)
Pt has appt on 12/30/21 at 2:30.  Ok to keep that appt or need to r/s for this week?

## 2021-12-25 ENCOUNTER — Encounter: Payer: Self-pay | Admitting: Family Medicine

## 2021-12-25 ENCOUNTER — Ambulatory Visit: Payer: Medicare Other

## 2021-12-25 ENCOUNTER — Telehealth: Payer: Self-pay

## 2021-12-25 ENCOUNTER — Ambulatory Visit (INDEPENDENT_AMBULATORY_CARE_PROVIDER_SITE_OTHER)
Admission: RE | Admit: 2021-12-25 | Discharge: 2021-12-25 | Disposition: A | Discharge status: Home / Self Care | Payer: Medicare Other | Source: Ambulatory Visit | Attending: Family Medicine | Admitting: Family Medicine

## 2021-12-25 ENCOUNTER — Ambulatory Visit (INDEPENDENT_AMBULATORY_CARE_PROVIDER_SITE_OTHER): Payer: Medicare Other | Admitting: Family Medicine

## 2021-12-25 ENCOUNTER — Ambulatory Visit (INDEPENDENT_AMBULATORY_CARE_PROVIDER_SITE_OTHER): Payer: Medicare Other

## 2021-12-25 ENCOUNTER — Other Ambulatory Visit: Payer: Self-pay

## 2021-12-25 VITALS — BP 110/50 | HR 81 | Temp 97.8°F | Ht 73.0 in | Wt 193.2 lb

## 2021-12-25 DIAGNOSIS — D509 Iron deficiency anemia, unspecified: Secondary | ICD-10-CM | POA: Diagnosis not present

## 2021-12-25 DIAGNOSIS — R2681 Unsteadiness on feet: Secondary | ICD-10-CM

## 2021-12-25 DIAGNOSIS — W19XXXA Unspecified fall, initial encounter: Secondary | ICD-10-CM

## 2021-12-25 DIAGNOSIS — T148XXA Other injury of unspecified body region, initial encounter: Secondary | ICD-10-CM

## 2021-12-25 DIAGNOSIS — R6889 Other general symptoms and signs: Secondary | ICD-10-CM | POA: Diagnosis not present

## 2021-12-25 DIAGNOSIS — D649 Anemia, unspecified: Secondary | ICD-10-CM | POA: Diagnosis not present

## 2021-12-25 DIAGNOSIS — S0990XA Unspecified injury of head, initial encounter: Secondary | ICD-10-CM | POA: Diagnosis not present

## 2021-12-25 DIAGNOSIS — G6289 Other specified polyneuropathies: Secondary | ICD-10-CM

## 2021-12-25 DIAGNOSIS — E538 Deficiency of other specified B group vitamins: Secondary | ICD-10-CM

## 2021-12-25 DIAGNOSIS — R0781 Pleurodynia: Secondary | ICD-10-CM

## 2021-12-25 DIAGNOSIS — Z7901 Long term (current) use of anticoagulants: Secondary | ICD-10-CM

## 2021-12-25 DIAGNOSIS — I4891 Unspecified atrial fibrillation: Secondary | ICD-10-CM

## 2021-12-25 DIAGNOSIS — Z5181 Encounter for therapeutic drug level monitoring: Secondary | ICD-10-CM

## 2021-12-25 LAB — SEDIMENTATION RATE: Sed Rate: 59 mm/hr — ABNORMAL HIGH (ref 0–20)

## 2021-12-25 LAB — FOLATE: Folate: 8.8 ng/mL (ref >5.9)

## 2021-12-25 LAB — CBC WITH DIFFERENTIAL/PLATELET
Basophils Absolute: 0 10*3/uL (ref 0.0–0.1)
Basophils Relative: 0.2 % (ref 0.0–3.0)
Eosinophils Absolute: 0 10*3/uL (ref 0.0–0.7)
Eosinophils Relative: 0.2 % (ref 0.0–5.0)
HCT: 33.3 % — ABNORMAL LOW (ref 39.0–52.0)
Hemoglobin: 10.6 g/dL — ABNORMAL LOW (ref 13.0–17.0)
Lymphocytes Relative: 9.6 % — ABNORMAL LOW (ref 12.0–46.0)
Lymphs Abs: 1.4 10*3/uL (ref 0.7–4.0)
MCHC: 31.8 g/dL (ref 30.0–36.0)
MCV: 91.9 fl (ref 78.0–100.0)
Monocytes Absolute: 1 10*3/uL (ref 0.1–1.0)
Monocytes Relative: 6.9 % (ref 3.0–12.0)
Neutro Abs: 12.2 10*3/uL — ABNORMAL HIGH (ref 1.4–7.7)
Neutrophils Relative %: 83.1 % — ABNORMAL HIGH (ref 43.0–77.0)
Platelets: 295 10*3/uL (ref 150.0–400.0)
RBC: 3.62 Mil/uL — ABNORMAL LOW (ref 4.22–5.81)
RDW: 15.4 % (ref 11.5–15.5)
WBC: 14.6 10*3/uL — ABNORMAL HIGH (ref 4.0–10.5)

## 2021-12-25 LAB — POCT INR: INR: 2.7 (ref 2.0–3.0)

## 2021-12-25 LAB — VITAMIN B12: Vitamin B-12: 478 pg/mL (ref 211–911)

## 2021-12-25 LAB — FERRITIN: Ferritin: 79.7 ng/mL (ref 22.0–322.0)

## 2021-12-25 NOTE — Assessment & Plan Note (Addendum)
This contributes to unsteadiness/falls.

## 2021-12-25 NOTE — Telephone Encounter (Signed)
Patient seen today

## 2021-12-25 NOTE — Assessment & Plan Note (Signed)
Relatively new - rpt CBC today with ferritin, folate, b12.

## 2021-12-25 NOTE — Telephone Encounter (Signed)
Patient spouse calling  Would like to know how coumadin will interact with grapefruit juice Forgot to ask at appt Please call to discuss

## 2021-12-25 NOTE — Patient Instructions (Signed)
Wounds are looking ok.  We have sent wound culture and will be in touch with results.  Chest xray today and will order head CT as well given recent falls. Start using walker whenever you're on your feet.  Labs today.

## 2021-12-25 NOTE — Assessment & Plan Note (Signed)
Ongoing worse the past few weeks.  Update labs today.  Check head CT in falls with head injury and anticoagulant use.  Advised he needs to start using walker regularly to help prevent falls while we further work up recent falls.

## 2021-12-25 NOTE — Telephone Encounter (Signed)
Left message for [name] that some studies do show that grapefruit juice can interfere w/ warfarin, so recommended he can have a small amount, but do not recommend he drink it regularly. Asked her to call back w/ any further questions or concerns.

## 2021-12-25 NOTE — Progress Notes (Signed)
Patient ID: [name], male    DOB: 1946-03-02, 76 y.o.   MRN: [id number]  This visit was conducted in person.  BP (!) 110/50 (BP Location: Right Arm, Cuff Size: Normal)    Pulse 81    Temp 97.8 F (36.6 C) (Temporal)    Ht _0  (1.854 m)    Wt 193 lb 4 oz (87.7 kg)    SpO2 95%    BMI 25.50 kg/m    CC: follow up visit  Subjective:   HPI: [name] is a 76 y.o. male presenting on 12/25/2021 for Fall (Pt fell several times in past wk.  C/o rib pain.  Pt accompanied by wife, [name]. )   See recent office visit, mychart message, and labwork for details.  Surgical incisions continue to heal - wife is treating sites with dakin solution and gauze. R lower groin surgical wound has fully closed, midline wound small opening with ongoing drainage.  Blood counts earlier this month with mild leukocytosis WBC 13.   Recent falls at home - once daily for the past 1.5 wks - with residual R lower rib pain - he thinks he cracked a rib. He also hit his head. Bruising from falls. He notes general sense of unsteadiness on his feet and then legs "give out". He is unable to catch himself at all. No vertigo or dizziness or syncope, no premonitory symptoms. No dyspnea, wheezing or cough. No other chest pain. L abdomen and L leg stays sore since recent falls. Continues chronic pain regimen of hydrocodone, gabapentin, robaxin, continues salon pas pads.     Relevant past medical, surgical, family and social history reviewed and updated as indicated. Interim medical history since our last visit reviewed. Allergies and medications reviewed and updated. Outpatient Medications Prior to Visit  Medication Sig Dispense Refill   albuterol (VENTOLIN HFA) 108 (90 Base) MCG/ACT inhaler INHALE 2 PUFFS INTO THE LUNGS EVERY 6 HOURS AS NEEDED FOR WHEEZING OR SHORTNESS OF BREATH 8.5 g 3   amiodarone (PACERONE) 200 MG tablet Take 200 mg by mouth daily.     aspirin 81 MG EC tablet Take 1 tablet (81 mg total) by mouth  daily. Swallow whole.     Budeson-Glycopyrrol-Formoterol (BREZTRI AEROSPHERE) 160-9-4.8 MCG/ACT AERO Inhale 2 puffs into the lungs in the morning and at bedtime. 10.7 g 0   Cholecalciferol (VITAMIN D) 50 MCG (2000 UT) CAPS Take 1 capsule (2,000 Units total) by mouth daily. 30 capsule    Coenzyme Q10 (COQ-10 PO) Take 1 tablet by mouth daily.     Cyanocobalamin (B-12) 1000 MCG SUBL Place 1 tablet under the tongue daily.     diclofenac Sodium (VOLTAREN) 1 % GEL Apply 4 g topically 3 (three) times daily. (Patient taking differently: Apply 4 g topically 3 (three) times daily. As needed) 100 g 3   diltiazem (CARDIZEM) 30 MG tablet Take 1 tablet (30 mg total) by mouth daily as needed (fast heart rate).     diphenoxylate-atropine (LOMOTIL) 2.5-0.025 MG tablet Take 1 tablet by mouth 2 (two) times daily as needed. 20 tablet 1   Ferrous Sulfate (IRON) 325 (65 Fe) MG TABS Take by mouth daily.     fish oil-omega-3 fatty acids 1000 MG capsule Take 2 g by mouth daily.     furosemide (LASIX) 20 MG tablet TAKE 1 TABLET BY MOUTH EVERY DAY AS NEEDED FOR FLUID RETENTION 90 tablet 0   gabapentin (NEURONTIN) 600 MG tablet Take 1 tablet (600 mg  total) by mouth 2 (two) times daily.     HYDROcodone-acetaminophen (NORCO) 10-325 MG tablet Take 1 tablet by mouth every 6 (six) hours as needed for moderate pain or severe pain. 120 tablet 0   ipratropium-albuterol (DUONEB) 0.5-2.5 (3) MG/3ML SOLN Take 3 mLs by nebulization every 6 (six) hours as needed. 360 mL 11   metFORMIN (GLUCOPHAGE-XR) 500 MG 24 hr tablet Take 1 tablet (500 mg total) by mouth daily with breakfast. 90 tablet 1   methocarbamol (ROBAXIN) 500 MG tablet TAKE 1 TABLET(500 MG) BY MOUTH AT BEDTIME 90 tablet 1   metoprolol tartrate (LOPRESSOR) 25 MG tablet Take 1 tablet (25 mg total) by mouth 2 (two) times daily as needed (fast heart rate).     midodrine (PROAMATINE) 10 MG tablet Take 1 tablet (10 mg total) by mouth 3 (three) times daily as needed.  90 tablet 1   nitroGLYCERIN (NITROLINGUAL) 0.4 MG/SPRAY spray USE 1 SPRAY AS DIRECTED EVERY 5 MINUTES AS NEEDED 4.9 g 1   omeprazole (PRILOSEC) 40 MG capsule Take 1 capsule (40 mg total) by mouth daily. 90 capsule 3   pravastatin (PRAVACHOL) 20 MG tablet TAKE 1 TABLET(20 MG) BY MOUTH DAILY 90 tablet 3   warfarin (COUMADIN) 2.5 MG tablet TAKE AS DIRECTED AFTER ANTI-COAG CLINIC 135 tablet 1   No facility-administered medications prior to visit.     Per HPI unless specifically indicated in ROS section below Review of Systems  Objective:  BP (!) 110/50 (BP Location: Right Arm, Cuff Size: Normal)    Pulse 81    Temp 97.8 F (36.6 C) (Temporal)    Ht _0  (1.854 m)    Wt 193 lb 4 oz (87.7 kg)    SpO2 95%    BMI 25.50 kg/m   Wt Readings from Last 3 Encounters:  12/25/21 193 lb 4 oz (87.7 kg)  12/11/21 179 lb (81.2 kg)  11/27/21 189 lb 9 oz (86 kg)      Physical Exam Vitals and nursing note reviewed.  Constitutional:      Appearance: Normal appearance. He is ill-appearing (tired).       Comments:  Sitting in wheelchair, needs assistance to get onto exam table Reproducible soreness to chest wall  Cardiovascular:     Rate and Rhythm: Normal rate. Rhythm irregular.     Pulses: Normal pulses.     Heart sounds: Normal heart sounds. No murmur heard. Pulmonary:     Effort: Pulmonary effort is normal. No respiratory distress.     Breath sounds: Normal breath sounds. No wheezing, rhonchi or rales.  Chest:     Chest wall: Tenderness (lower R ribcage tenderness, mid L ribcage tenderness) present.  Musculoskeletal:     Right lower leg: No edema.     Left lower leg: No edema.  Skin:    General: Skin is warm and dry.     Findings: Wound present. No erythema or rash.          Comments: R groin incision fully closed  Infraumbilical wound open with mild drainage present without surrounding erythema  Area cleaned with normal saline, wound drainage sent for culture   Neurological:      Mental Status: He is alert.     Comments:  CN 2-12 intact EOMI FTN unsteady but intact Unsteady on feet      Results for orders placed or performed in visit on 12/11/21  POCT INR  Result Value Ref Range   INR 1.7 (A) 2.0 - 3.0   *  Note: Due to a large number of results and/or encounters for the requested time period, some results have not been displayed. A complete set of results can be found in Results Review.   Lab Results  Component Value Date   CREATININE 0.91 12/11/2021   BUN 16 12/11/2021   NA 138 12/11/2021   K 4.5 12/11/2021   CL 100 12/11/2021   CO2 29 12/11/2021   Lab Results  Component Value Date   WBC 13.0 (H) 12/11/2021   HGB 11.7 (L) 12/11/2021   HCT 37.0 (L) 12/11/2021   MCV 93.7 12/11/2021   PLT 361.0 12/11/2021   Assessment & Plan:  This visit occurred during the SARS-CoV-2 public health emergency.  Safety protocols were in place, including screening questions prior to the visit, additional usage of staff PPE, and extensive cleaning of exam room while observing appropriate contact time as indicated for disinfecting solutions.   Problem List Items Addressed This Visit     Current use of long term anticoagulation (Coumadin)    He has coumadin clinic scheduled this afternoon. With multiple recent falls, may need to weight benefits vs risks of ongoing anticoagulation      Recent head trauma - Primary    Multiple recent falls with resultant head injury.  In ongoing anticoagulation, will check head CT r/o intracranial injury, will also further evaluate for cause of ongoing unsteadiness.       Relevant Orders   CT HEAD WO CONTRAST (5MM)   Iron deficiency anemia    Update ferritin levels (last checked 22 09/2021 off replacement). Wife recently started oral iron replacement.       Relevant Medications   Ferrous Sulfate (IRON) 325 (65 Fe) MG TABS   Vitamin B12 deficiency    Update levels on b12 1056mg daily.       Fall with injury    Multiple falls  this past week with resultant bruising, R>L ribcage pain, head pain from falls.  Check CXR eval ribcage as well as r/o lung injury after multiple falls.       Relevant Orders   DG Chest 2 View   CT HEAD WO CONTRAST (5MM)   General unsteadiness    Ongoing worse the past few weeks.  Update labs today.  Check head CT in falls with head injury and anticoagulant use.  Advised he needs to start using walker regularly to help prevent falls while we further work up recent falls.       Peripheral neuropathy    This contributes to unsteadiness/falls.       Rib pain on right side    Update CXR eval for rib fracture      Surgical wound present    Wound culture sent of drainage present.  Recent leukocytosis, repeat CBC ESR ferritin today.       Relevant Orders   CBC with Differential/Platelet   Sedimentation rate   WOUND CULTURE   Anemia    Relatively new - rpt CBC today with ferritin, folate, b12.      Relevant Medications   Ferrous Sulfate (IRON) 325 (65 Fe) MG TABS   Other Relevant Orders   Vitamin B12   Folate   IBC panel   Ferritin   Other Visit Diagnoses     Other general symptoms and signs       Relevant Orders   Vitamin B12   Folate        No orders of the defined types were placed in this encounter.  Orders Placed This Encounter  Procedures   WOUND CULTURE    Order Specific Question:   Source    Answer:   midline suprapubic abdominal surgical incision   DG Chest 2 View    Standing Status:   Future    Number of Occurrences:   1    Standing Expiration Date:   12/25/2022    Order Specific Question:   Reason for Exam (SYMPTOM  OR DIAGNOSIS REQUIRED)    Answer:   R ribcage pain after fall    Order Specific Question:   Preferred imaging location?    Answer:   [facility]   CT HEAD WO CONTRAST (5MM)    NPR per L[facility]    Standing Status:   Future    Standing Expiration Date:   12/25/2022    Order Specific Question:   Preferred imaging location?     Answer:   [facility]    Order Specific Question:   Call Results- Best Contact Number?    Answer:   [phone]- pt can leave   CBC with Differential/Platelet   Sedimentation rate   Vitamin B12   Folate   IBC panel   Ferritin     Patient Instructions  Wounds are looking ok.  We have sent wound culture and will be in touch with results.  Chest xray today and will order head CT as well given recent falls. Start using walker whenever you're on your feet.  Labs today.   Follow up plan: Return if symptoms worsen or fail to improve.  Ria Bush, MD

## 2021-12-25 NOTE — Assessment & Plan Note (Signed)
He has coumadin clinic scheduled this afternoon. With multiple recent falls, may need to weight benefits vs risks of ongoing anticoagulation

## 2021-12-25 NOTE — Assessment & Plan Note (Addendum)
Update ferritin levels (last checked 22 09/2021 off replacement). Wife recently started oral iron replacement.

## 2021-12-25 NOTE — Assessment & Plan Note (Addendum)
Wound culture sent of drainage present.  Recent leukocytosis, repeat CBC ESR ferritin today.

## 2021-12-25 NOTE — Patient Instructions (Signed)
-   continue dosage of warfarin of 1 tablet every day EXCEPT 1.5 TABLETS ON MONDAYS, WEDNESDAYS & FRIDAYS - Recheck INR in 4 weeks

## 2021-12-25 NOTE — Assessment & Plan Note (Signed)
Update CXR eval for rib fracture

## 2021-12-25 NOTE — Assessment & Plan Note (Addendum)
Update levels on b12 1030mcg daily.

## 2021-12-25 NOTE — Assessment & Plan Note (Signed)
Multiple falls this past week with resultant bruising, R>L ribcage pain, head pain from falls.  Check CXR eval ribcage as well as r/o lung injury after multiple falls.

## 2021-12-25 NOTE — Addendum Note (Signed)
Addended by: Ellamae Sia on: 12/25/2021 02:21 PM   Modules accepted: Orders

## 2021-12-25 NOTE — Assessment & Plan Note (Signed)
Multiple recent falls with resultant head injury.  In ongoing anticoagulation, will check head CT r/o intracranial injury, will also further evaluate for cause of ongoing unsteadiness.

## 2021-12-26 ENCOUNTER — Encounter: Payer: Self-pay | Admitting: Family Medicine

## 2021-12-26 ENCOUNTER — Ambulatory Visit: Payer: Medicare Other

## 2021-12-26 DIAGNOSIS — T8189XA Other complications of procedures, not elsewhere classified, initial encounter: Secondary | ICD-10-CM | POA: Diagnosis not present

## 2021-12-26 DIAGNOSIS — K651 Peritoneal abscess: Secondary | ICD-10-CM | POA: Diagnosis not present

## 2021-12-26 DIAGNOSIS — D72829 Elevated white blood cell count, unspecified: Secondary | ICD-10-CM | POA: Diagnosis not present

## 2021-12-26 DIAGNOSIS — I959 Hypotension, unspecified: Secondary | ICD-10-CM | POA: Diagnosis not present

## 2021-12-26 DIAGNOSIS — A419 Sepsis, unspecified organism: Secondary | ICD-10-CM | POA: Diagnosis not present

## 2021-12-26 DIAGNOSIS — Z20822 Contact with and (suspected) exposure to covid-19: Secondary | ICD-10-CM | POA: Diagnosis not present

## 2021-12-26 DIAGNOSIS — Z7901 Long term (current) use of anticoagulants: Secondary | ICD-10-CM | POA: Diagnosis not present

## 2021-12-26 DIAGNOSIS — R001 Bradycardia, unspecified: Secondary | ICD-10-CM | POA: Diagnosis not present

## 2021-12-26 DIAGNOSIS — E119 Type 2 diabetes mellitus without complications: Secondary | ICD-10-CM | POA: Diagnosis not present

## 2021-12-26 DIAGNOSIS — L0291 Cutaneous abscess, unspecified: Secondary | ICD-10-CM | POA: Diagnosis not present

## 2021-12-26 DIAGNOSIS — T8141XA Infection following a procedure, superficial incisional surgical site, initial encounter: Secondary | ICD-10-CM | POA: Diagnosis not present

## 2021-12-26 DIAGNOSIS — S0990XA Unspecified injury of head, initial encounter: Secondary | ICD-10-CM | POA: Diagnosis not present

## 2021-12-26 DIAGNOSIS — L02214 Cutaneous abscess of groin: Secondary | ICD-10-CM | POA: Diagnosis not present

## 2021-12-26 DIAGNOSIS — Z7984 Long term (current) use of oral hypoglycemic drugs: Secondary | ICD-10-CM | POA: Diagnosis not present

## 2021-12-26 DIAGNOSIS — I4891 Unspecified atrial fibrillation: Secondary | ICD-10-CM | POA: Diagnosis not present

## 2021-12-26 DIAGNOSIS — S31109A Unspecified open wound of abdominal wall, unspecified quadrant without penetration into peritoneal cavity, initial encounter: Secondary | ICD-10-CM | POA: Diagnosis not present

## 2021-12-26 DIAGNOSIS — Z7982 Long term (current) use of aspirin: Secondary | ICD-10-CM | POA: Diagnosis not present

## 2021-12-26 DIAGNOSIS — K219 Gastro-esophageal reflux disease without esophagitis: Secondary | ICD-10-CM | POA: Diagnosis not present

## 2021-12-26 DIAGNOSIS — I9589 Other hypotension: Secondary | ICD-10-CM | POA: Diagnosis not present

## 2021-12-26 DIAGNOSIS — Z79899 Other long term (current) drug therapy: Secondary | ICD-10-CM | POA: Diagnosis not present

## 2021-12-26 DIAGNOSIS — Z4682 Encounter for fitting and adjustment of non-vascular catheter: Secondary | ICD-10-CM | POA: Diagnosis not present

## 2021-12-26 DIAGNOSIS — J449 Chronic obstructive pulmonary disease, unspecified: Secondary | ICD-10-CM | POA: Diagnosis not present

## 2021-12-28 ENCOUNTER — Encounter: Payer: Self-pay | Admitting: Family Medicine

## 2021-12-29 LAB — WOUND CULTURE
MICRO NUMBER:: 12886477
SPECIMEN QUALITY:: ADEQUATE

## 2021-12-30 ENCOUNTER — Ambulatory Visit: Payer: Medicare Other

## 2021-12-30 ENCOUNTER — Ambulatory Visit: Payer: Medicare Other | Admitting: Family Medicine

## 2021-12-30 NOTE — Telephone Encounter (Signed)
Faxed wound cx results to [facility].

## 2022-01-03 DIAGNOSIS — I714 Abdominal aortic aneurysm, without rupture, unspecified: Secondary | ICD-10-CM | POA: Diagnosis not present

## 2022-01-03 DIAGNOSIS — S31109A Unspecified open wound of abdominal wall, unspecified quadrant without penetration into peritoneal cavity, initial encounter: Secondary | ICD-10-CM | POA: Diagnosis not present

## 2022-01-03 DIAGNOSIS — Z48815 Encounter for surgical aftercare following surgery on the digestive system: Secondary | ICD-10-CM | POA: Diagnosis not present

## 2022-01-03 DIAGNOSIS — Z8719 Personal history of other diseases of the digestive system: Secondary | ICD-10-CM | POA: Diagnosis not present

## 2022-01-03 DIAGNOSIS — Z9049 Acquired absence of other specified parts of digestive tract: Secondary | ICD-10-CM | POA: Diagnosis not present

## 2022-01-03 DIAGNOSIS — R109 Unspecified abdominal pain: Secondary | ICD-10-CM | POA: Diagnosis not present

## 2022-01-03 DIAGNOSIS — K651 Peritoneal abscess: Secondary | ICD-10-CM | POA: Diagnosis not present

## 2022-01-13 DIAGNOSIS — Z48815 Encounter for surgical aftercare following surgery on the digestive system: Secondary | ICD-10-CM | POA: Diagnosis not present

## 2022-01-13 DIAGNOSIS — Z8719 Personal history of other diseases of the digestive system: Secondary | ICD-10-CM | POA: Diagnosis not present

## 2022-01-17 ENCOUNTER — Other Ambulatory Visit: Payer: Self-pay | Admitting: Family Medicine

## 2022-01-17 DIAGNOSIS — G894 Chronic pain syndrome: Secondary | ICD-10-CM

## 2022-01-20 NOTE — Telephone Encounter (Signed)
Plz address in Dr. Synthia Innocent absence.  Name of Medication: Hydrocodone-APAP Name of Pharmacy: [facility] [place] or Written Date and Quantity: 12/19/21, #120 Last Office Visit and Type: 12/25/21, fall; last CPE, 09/10/21 Next Office Visit and Type: none Last Controlled Substance Agreement Date: 07/06/17 Last UDS: 02/03/18

## 2022-01-21 MED ORDER — HYDROCODONE-ACETAMINOPHEN 10-325 MG PO TABS: 1.0000 | ORAL_TABLET | Freq: Four times a day (QID) | ORAL | 0 refills | Status: DC | PRN

## 2022-01-21 NOTE — Telephone Encounter (Signed)
Sent. Thanks.   

## 2022-01-22 ENCOUNTER — Ambulatory Visit (INDEPENDENT_AMBULATORY_CARE_PROVIDER_SITE_OTHER): Payer: Medicare Other

## 2022-01-22 ENCOUNTER — Other Ambulatory Visit: Payer: Self-pay

## 2022-01-22 DIAGNOSIS — Z5181 Encounter for therapeutic drug level monitoring: Secondary | ICD-10-CM | POA: Diagnosis not present

## 2022-01-22 DIAGNOSIS — I4891 Unspecified atrial fibrillation: Secondary | ICD-10-CM

## 2022-01-22 LAB — POCT INR: INR: 1.6 — AB (ref 2.0–3.0)

## 2022-01-22 NOTE — Patient Instructions (Signed)
-   take extra 1/2 tablet tonight, then - START NEW dosage of warfarin of 1.5 tablets every day EXCEPT 1 TABLET ON MONDAYS, WEDNESDAYS & FRIDAYS - Recheck INR in 4 weeks

## 2022-01-30 ENCOUNTER — Ambulatory Visit (INDEPENDENT_AMBULATORY_CARE_PROVIDER_SITE_OTHER): Payer: Medicare Other | Admitting: Family Medicine

## 2022-01-30 ENCOUNTER — Other Ambulatory Visit: Payer: Self-pay

## 2022-01-30 ENCOUNTER — Encounter: Payer: Self-pay | Admitting: Family Medicine

## 2022-01-30 VITALS — BP 132/68 | HR 44 | Temp 98.0°F | Ht 71.25 in | Wt 185.1 lb

## 2022-01-30 DIAGNOSIS — W19XXXA Unspecified fall, initial encounter: Secondary | ICD-10-CM | POA: Diagnosis not present

## 2022-01-30 DIAGNOSIS — G6289 Other specified polyneuropathies: Secondary | ICD-10-CM

## 2022-01-30 DIAGNOSIS — Z9889 Other specified postprocedural states: Secondary | ICD-10-CM

## 2022-01-30 DIAGNOSIS — E1140 Type 2 diabetes mellitus with diabetic neuropathy, unspecified: Secondary | ICD-10-CM | POA: Diagnosis not present

## 2022-01-30 DIAGNOSIS — T8143XA Infection following a procedure, organ and space surgical site, initial encounter: Secondary | ICD-10-CM

## 2022-01-30 DIAGNOSIS — R197 Diarrhea, unspecified: Secondary | ICD-10-CM

## 2022-01-30 DIAGNOSIS — Z8719 Personal history of other diseases of the digestive system: Secondary | ICD-10-CM | POA: Diagnosis not present

## 2022-01-30 DIAGNOSIS — T148XXA Other injury of unspecified body region, initial encounter: Secondary | ICD-10-CM

## 2022-01-30 DIAGNOSIS — R2681 Unsteadiness on feet: Secondary | ICD-10-CM | POA: Diagnosis not present

## 2022-01-30 DIAGNOSIS — Z7901 Long term (current) use of anticoagulants: Secondary | ICD-10-CM

## 2022-01-30 LAB — COMPREHENSIVE METABOLIC PANEL
ALT: 11 U/L (ref 0–53)
AST: 16 U/L (ref 0–37)
Albumin: 3.5 g/dL (ref 3.5–5.2)
Alkaline Phosphatase: 73 U/L (ref 39–117)
BUN: 17 mg/dL (ref 6–23)
CO2: 33 mEq/L — ABNORMAL HIGH (ref 19–32)
Calcium: 9.1 mg/dL (ref 8.4–10.5)
Chloride: 104 mEq/L (ref 96–112)
Creatinine, Ser: 0.95 mg/dL (ref 0.40–1.50)
GFR: 77.08 mL/min (ref >60.00)
Glucose, Bld: 104 mg/dL — ABNORMAL HIGH (ref 70–99)
Potassium: 4.3 mEq/L (ref 3.5–5.1)
Sodium: 140 mEq/L (ref 135–145)
Total Bilirubin: 0.5 mg/dL (ref 0.2–1.2)
Total Protein: 6.5 g/dL (ref 6.0–8.3)

## 2022-01-30 LAB — CBC WITH DIFFERENTIAL/PLATELET
Basophils Absolute: 0 10*3/uL (ref 0.0–0.1)
Basophils Relative: 0.3 % (ref 0.0–3.0)
Eosinophils Absolute: 0.4 10*3/uL (ref 0.0–0.7)
Eosinophils Relative: 4.4 % (ref 0.0–5.0)
HCT: 37 % — ABNORMAL LOW (ref 39.0–52.0)
Hemoglobin: 11.8 g/dL — ABNORMAL LOW (ref 13.0–17.0)
Lymphocytes Relative: 26.6 % (ref 12.0–46.0)
Lymphs Abs: 2.3 10*3/uL (ref 0.7–4.0)
MCHC: 31.9 g/dL (ref 30.0–36.0)
MCV: 92.1 fl (ref 78.0–100.0)
Monocytes Absolute: 0.6 10*3/uL (ref 0.1–1.0)
Monocytes Relative: 6.5 % (ref 3.0–12.0)
Neutro Abs: 5.4 10*3/uL (ref 1.4–7.7)
Neutrophils Relative %: 62.2 % (ref 43.0–77.0)
Platelets: 266 10*3/uL (ref 150.0–400.0)
RBC: 4.01 Mil/uL — ABNORMAL LOW (ref 4.22–5.81)
RDW: 16.2 % — ABNORMAL HIGH (ref 11.5–15.5)
WBC: 8.7 10*3/uL (ref 4.0–10.5)

## 2022-01-30 MED ORDER — METHOCARBAMOL 500 MG PO TABS: 500.0000 mg | ORAL_TABLET | Freq: Every evening | ORAL | 0 refills | Status: DC | PRN

## 2022-01-30 MED ORDER — OMEPRAZOLE 40 MG PO CPDR: 40.0000 mg | DELAYED_RELEASE_CAPSULE | Freq: Every day | ORAL | 3 refills | Status: DC

## 2022-01-30 MED ORDER — DIPHENOXYLATE-ATROPINE 2.5-0.025 MG PO TABS: 1.0000 | ORAL_TABLET | Freq: Two times a day (BID) | ORAL | 0 refills | Status: DC | PRN

## 2022-01-30 NOTE — Patient Instructions (Addendum)
Labs and stool test today  Ok to hold metformin until diarrhea gets better.  Continue drinking plenty of water.  Continue current medicines.

## 2022-01-30 NOTE — Progress Notes (Signed)
Patient ID: [name], male    DOB: Feb 22, 1946, 76 y.o.   MRN: [id number]  This visit was conducted in person.  BP 132/68    Pulse (!) 44    Temp 98 F (36.7 C) (Temporal)    Ht 5' 11.25" (1.81 m)    Wt 185 lb 2 oz (84 kg)    SpO2 94%    BMI 25.64 kg/m    CC: hospital f/u visit  Subjective:   HPI: [name] is a 76 y.o. male presenting on 01/30/2022 for Dizziness (C/o off and on dizzy spells and diarrhea since hosp d/c last month.  Accompanied by wife, [name]. )   Recent hospitalization for sepsis due to intraabdominal abscess presenting with generalized weakness, anorexia, falls and fever.  Hospital records reviewed. Med rec performed.  Wound culture grew pseudomonas. Wound culture collected in the office grew citrobacter braakii and pseudomonas aeuroginosa. Treated with ciprofloxacin and flagyl x7d course. He had a fall without injury at the hospital, was recommended stay for further evaluation but left AMA.   S/p ex lap with R ing hernia repair and ileocecectomy 11/04/2021.  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   JP drain removed at [facility] surgery clinic 01/13/2022.   Since home, notes difficulty catching balance especially when he's standing up "I teeter" and especially when bending over. Had a fall last night where he fell backward and hit his head.  Not regularly using ambulatory assistive device. He is in wheelchair today.  Denies chest pain, dyspnea.  Wound has completely healed.  Appetite stable.   Also having intermittent diarrhea since hernia repair 10/2021. Having episodes of bowel incontinence due to stool urgency. 3 loose stools per day, more malodorous than normal. No blood in stool, no mucous in stool, no black tarry stool.   He continues metformin XR - but stopped this recently due to diarrhea.  Cbg 116 last night.   Home health was not set up - he declined. He did have short course of [facility] 11/2021 after hernia repair.  Other follow  up appointments scheduled: none ______________________________________________________________________ Hospital admission: 12/26/2021 Hospital discharge: 12/30/2021 TCM f/u phone call: not performed   Discharge Diagnoses:  Intraabdominal abscess Abdominal pain [place]   Consult Orders: IP CONSULT TO GENERAL SURGERY      Relevant past medical, surgical, family and social history reviewed and updated as indicated. Interim medical history since our last visit reviewed. Allergies and medications reviewed and updated. Outpatient Medications Prior to Visit  Medication Sig Dispense Refill   albuterol (VENTOLIN HFA) 108 (90 Base) MCG/ACT inhaler INHALE 2 PUFFS INTO THE LUNGS EVERY 6 HOURS AS NEEDED FOR WHEEZING OR SHORTNESS OF BREATH 8.5 g 3   amiodarone (PACERONE) 200 MG tablet Take 200 mg by mouth daily.     aspirin 81 MG EC tablet Take 1 tablet (81 mg total) by mouth daily. Swallow whole.     Budeson-Glycopyrrol-Formoterol (BREZTRI AEROSPHERE) 160-9-4.8 MCG/ACT AERO Inhale 2 puffs into the lungs in the morning and at bedtime. 10.7 g 0   Cholecalciferol (VITAMIN D) 50 MCG (2000 UT) CAPS Take 1 capsule (2,000 Units total) by mouth daily. 30 capsule    Coenzyme Q10 (COQ-10 PO) Take 1 tablet by mouth daily.     Cyanocobalamin (B-12) 1000 MCG SUBL Place 1 tablet under the tongue daily.     diclofenac Sodium (VOLTAREN) 1 % GEL Apply 4 g topically 3 (three) times daily. (Patient taking differently:  Apply 4 g topically 3 (three) times daily. As needed) 100 g 3   diltiazem (CARDIZEM) 30 MG tablet Take 1 tablet (30 mg total) by mouth daily as needed (fast heart rate).     Ferrous Sulfate (IRON) 325 (65 Fe) MG TABS Take by mouth daily.     fish oil-omega-3 fatty acids 1000 MG capsule Take 2 g by mouth daily.     furosemide (LASIX) 20 MG tablet TAKE 1 TABLET BY MOUTH EVERY DAY AS NEEDED FOR FLUID RETENTION 90 tablet 0   gabapentin (NEURONTIN) 600 MG tablet Take 1 tablet (600 mg total) by mouth 2  (two) times daily.     HYDROcodone-acetaminophen (NORCO) 10-325 MG tablet Take 1 tablet by mouth every 6 (six) hours as needed for moderate pain or severe pain. 120 tablet 0   ipratropium-albuterol (DUONEB) 0.5-2.5 (3) MG/3ML SOLN Take 3 mLs by nebulization every 6 (six) hours as needed. 360 mL 11   metFORMIN (GLUCOPHAGE-XR) 500 MG 24 hr tablet Take 1 tablet (500 mg total) by mouth daily with breakfast. 90 tablet 1   metoprolol tartrate (LOPRESSOR) 25 MG tablet Take 1 tablet (25 mg total) by mouth 2 (two) times daily as needed (fast heart rate).     midodrine (PROAMATINE) 10 MG tablet Take 1 tablet (10 mg total) by mouth 3 (three) times daily as needed. 90 tablet 1   nitroGLYCERIN (NITROLINGUAL) 0.4 MG/SPRAY spray USE 1 SPRAY AS DIRECTED EVERY 5 MINUTES AS NEEDED 4.9 g 1   pravastatin (PRAVACHOL) 20 MG tablet TAKE 1 TABLET(20 MG) BY MOUTH DAILY 90 tablet 3   spironolactone (ALDACTONE) 25 MG tablet Take 0.5 tablets (12.5 mg total) by mouth daily.     warfarin (COUMADIN) 2.5 MG tablet TAKE AS DIRECTED AFTER ANTI-COAG CLINIC 135 tablet 1   diphenoxylate-atropine (LOMOTIL) 2.5-0.025 MG tablet Take 1 tablet by mouth 2 (two) times daily as needed. 20 tablet 1   methocarbamol (ROBAXIN) 500 MG tablet TAKE 1 TABLET(500 MG) BY MOUTH AT BEDTIME 90 tablet 1   omeprazole (PRILOSEC) 40 MG capsule Take 1 capsule (40 mg total) by mouth daily. 90 capsule 3   No facility-administered medications prior to visit.     Per HPI unless specifically indicated in ROS section below Review of Systems  Objective:  BP 132/68    Pulse (!) 44    Temp 98 F (36.7 C) (Temporal)    Ht 5' 11.25" (1.81 m)    Wt 185 lb 2 oz (84 kg)    SpO2 94%    BMI 25.64 kg/m   Wt Readings from Last 3 Encounters:  01/30/22 185 lb 2 oz (84 kg)  12/25/21 193 lb 4 oz (87.7 kg)  12/11/21 179 lb (81.2 kg)      Physical Exam Vitals and nursing note reviewed.  Constitutional:      Appearance: Normal appearance. He is not ill-appearing.      Comments: Sitting in wheelchair  HENT:     Head: Normocephalic. Abrasion present.      Comments: Linear abrasion to vertex/back of head from recent fall Cardiovascular:     Rate and Rhythm: Normal rate. Rhythm irregular.     Pulses: Normal pulses.     Heart sounds: Normal heart sounds. No murmur heard. Pulmonary:     Effort: Pulmonary effort is normal. No respiratory distress.     Breath sounds: Normal breath sounds. No wheezing, rhonchi or rales.  Abdominal:     Comments: Inguinal incisions have fully closed  Musculoskeletal:     Right lower leg: No edema.     Left lower leg: No edema.  Skin:    General: Skin is warm and dry.     Findings: No rash.  Neurological:     Mental Status: He is alert.      Results for orders placed or performed in visit on 01/30/22  CBC with Differential/Platelet  Result Value Ref Range   WBC 8.7 4.0 - 10.5 K/uL   RBC 4.01 (L) 4.22 - 5.81 Mil/uL   Hemoglobin 11.8 (L) 13.0 - 17.0 g/dL   HCT 37.0 (L) 39.0 - 52.0 %   MCV 92.1 78.0 - 100.0 fl   MCHC 31.9 30.0 - 36.0 g/dL   RDW 16.2 (H) 11.5 - 15.5 %   Platelets 266.0 150.0 - 400.0 K/uL   Neutrophils Relative % 62.2 43.0 - 77.0 %   Lymphocytes Relative 26.6 12.0 - 46.0 %   Monocytes Relative 6.5 3.0 - 12.0 %   Eosinophils Relative 4.4 0.0 - 5.0 %   Basophils Relative 0.3 0.0 - 3.0 %   Neutro Abs 5.4 1.4 - 7.7 K/uL   Lymphs Abs 2.3 0.7 - 4.0 K/uL   Monocytes Absolute 0.6 0.1 - 1.0 K/uL   Eosinophils Absolute 0.4 0.0 - 0.7 K/uL   Basophils Absolute 0.0 0.0 - 0.1 K/uL  Comprehensive metabolic panel  Result Value Ref Range   Sodium 140 135 - 145 mEq/L   Potassium 4.3 3.5 - 5.1 mEq/L   Chloride 104 96 - 112 mEq/L   CO2 33 (H) 19 - 32 mEq/L   Glucose, Bld 104 (H) 70 - 99 mg/dL   BUN 17 6 - 23 mg/dL   Creatinine, Ser 0.95 0.40 - 1.50 mg/dL   Total Bilirubin 0.5 0.2 - 1.2 mg/dL   Alkaline Phosphatase 73 39 - 117 U/L   AST 16 0 - 37 U/L   ALT 11 0 - 53 U/L   Total Protein 6.5 6.0 - 8.3 g/dL    Albumin 3.5 3.5 - 5.2 g/dL   GFR 77.08 >60.00 mL/min   Calcium 9.1 8.4 - 10.5 mg/dL   *Note: Due to a large number of results and/or encounters for the requested time period, some results have not been displayed. A complete set of results can be found in Results Review.   Lab Results  Component Value Date   HGBA1C 6.1 12/11/2021    Assessment & Plan:  This visit occurred during the SARS-CoV-2 public health emergency.  Safety protocols were in place, including screening questions prior to the visit, additional usage of staff PPE, and extensive cleaning of exam room while observing appropriate contact time as indicated for disinfecting solutions.   Problem List Items Addressed This Visit     Type 2 diabetes, controlled, with neuropathy ([place])    He's been taking metformin once daily, but has recently held - advised continue holding until diarrhea has resolved.  Latest A1c 6.1%       Diarrhea    Ongoing intermittent diarrhea since inguinal hernia repair 10/2021. He has had several abx courses since then. Will send for C diff testing.  Advised ok to hold metformin XR 500mg  until diarrhea has resolved, however I don't think metformin is causing diarrhea.       Relevant Orders   CBC with Differential/Platelet (Completed)   Comprehensive metabolic panel (Completed)   C. difficile GDH and Toxin A/B   Current use of long term anticoagulation (Coumadin)   Fall with  injury    Ongoing falls associated with generalized unsteadiness, most recently yesterday where he hit his head with resultant abrasion.  They ask about parkinson's - discussed this. He has tremor and generalized imbalance however doesn't have stiffness or significant memory issues to date.  Will consider neurology evaluation and in interim work towards tapering down on medications that increase fall risk including opiate, muscle relaxant, gabapentin. Will consider neurological evaluation.       General unsteadiness    Ongoing.  He is still not regularly using ambulatory assistive device.       Peripheral neuropathy    This contributes to unsteadiness.       Relevant Medications   methocarbamol (ROBAXIN) 500 MG tablet   History of right inguinal hernia repair   Postprocedural intraabdominal abscess - Primary    S/p drainage and 1 wk abx treatment course (cipro/flagyl). Doing much better from this standpoint.       RESOLVED: Surgical wound present    This has fully closed without evidence of skin infection.         Meds ordered this encounter  Medications   diphenoxylate-atropine (LOMOTIL) 2.5-0.025 MG tablet    Sig: Take 1 tablet by mouth 2 (two) times daily as needed.    Dispense:  20 tablet    Refill:  0   methocarbamol (ROBAXIN) 500 MG tablet    Sig: Take 1 tablet (500 mg total) by mouth at bedtime as needed for muscle spasms.    Dispense:  90 tablet    Refill:  0   omeprazole (PRILOSEC) 40 MG capsule    Sig: Take 1 capsule (40 mg total) by mouth daily.    Dispense:  90 capsule    Refill:  3   Orders Placed This Encounter  Procedures   CBC with Differential/Platelet   Comprehensive metabolic panel   C. difficile GDH and Toxin A/B    Standing Status:   Future    Standing Expiration Date:   01/30/2023     Patient Instructions  Labs and stool test today  Ok to hold metformin until diarrhea gets better.  Continue drinking plenty of water.  Continue current medicines.   Follow up plan: Return if symptoms worsen or fail to improve.  Ria Bush, MD

## 2022-01-31 ENCOUNTER — Encounter: Payer: Self-pay | Admitting: Family Medicine

## 2022-01-31 NOTE — Assessment & Plan Note (Signed)
Ongoing intermittent diarrhea since inguinal hernia repair 10/2021. He has had several abx courses since then. Will send for C diff testing.  Advised ok to hold metformin XR 500mg  until diarrhea has resolved, however I don't think metformin is causing diarrhea.

## 2022-01-31 NOTE — Assessment & Plan Note (Signed)
This has fully closed without evidence of skin infection.

## 2022-02-01 DIAGNOSIS — K651 Peritoneal abscess: Secondary | ICD-10-CM | POA: Insufficient documentation

## 2022-02-01 DIAGNOSIS — T8143XA Infection following a procedure, organ and space surgical site, initial encounter: Secondary | ICD-10-CM | POA: Insufficient documentation

## 2022-02-01 HISTORY — DX: Peritoneal abscess: K65.1

## 2022-02-01 NOTE — Assessment & Plan Note (Signed)
This contributes to unsteadiness.

## 2022-02-01 NOTE — Assessment & Plan Note (Signed)
He's been taking metformin once daily, but has recently held - advised continue holding until diarrhea has resolved.  Latest A1c 6.1%

## 2022-02-01 NOTE — Assessment & Plan Note (Signed)
S/p drainage and 1 wk abx treatment course (cipro/flagyl). Doing much better from this standpoint.

## 2022-02-01 NOTE — Assessment & Plan Note (Addendum)
Ongoing falls associated with generalized unsteadiness, most recently yesterday where he hit his head with resultant abrasion.  They ask about parkinson's - discussed this. He has tremor and generalized imbalance however doesn't have stiffness or significant memory issues to date.  Will consider neurology evaluation and in interim work towards tapering down on medications that increase fall risk including opiate, muscle relaxant, gabapentin. Will consider neurological evaluation.

## 2022-02-01 NOTE — Assessment & Plan Note (Addendum)
Ongoing. He is still not regularly using ambulatory assistive device.

## 2022-02-04 ENCOUNTER — Other Ambulatory Visit: Payer: Medicare Other

## 2022-02-04 DIAGNOSIS — R197 Diarrhea, unspecified: Secondary | ICD-10-CM | POA: Diagnosis not present

## 2022-02-05 ENCOUNTER — Ambulatory Visit: Payer: Medicare Other | Admitting: Dermatology

## 2022-02-06 ENCOUNTER — Other Ambulatory Visit: Payer: Self-pay | Admitting: Family Medicine

## 2022-02-06 DIAGNOSIS — A498 Other bacterial infections of unspecified site: Secondary | ICD-10-CM | POA: Insufficient documentation

## 2022-02-06 HISTORY — DX: Other bacterial infections of unspecified site: A49.8

## 2022-02-06 LAB — C. DIFFICILE GDH AND TOXIN A/B
GDH ANTIGEN: DETECTED
MICRO NUMBER:: 13067899
SPECIMEN QUALITY:: ADEQUATE
TOXIN A AND B: NOT DETECTED

## 2022-02-06 LAB — CLOSTRIDIUM DIFFICILE TOXIN B, QUALITATIVE, REAL-TIME PCR: Toxigenic C. Difficile by PCR: DETECTED — AB

## 2022-02-06 MED ORDER — VANCOMYCIN HCL 125 MG PO CAPS: 125.0000 mg | ORAL_CAPSULE | Freq: Four times a day (QID) | ORAL | 0 refills | Status: AC

## 2022-02-10 ENCOUNTER — Inpatient Hospital Stay: Payer: Medicare Other | Attending: Radiation Oncology

## 2022-02-10 ENCOUNTER — Other Ambulatory Visit: Payer: Self-pay

## 2022-02-10 DIAGNOSIS — C61 Malignant neoplasm of prostate: Secondary | ICD-10-CM | POA: Insufficient documentation

## 2022-02-10 LAB — PSA: Prostatic Specific Antigen: 0.07 ng/mL (ref 0.00–4.00)

## 2022-02-12 DIAGNOSIS — C44319 Basal cell carcinoma of skin of other parts of face: Secondary | ICD-10-CM | POA: Diagnosis not present

## 2022-02-13 ENCOUNTER — Telehealth: Payer: Self-pay | Admitting: Cardiovascular Disease

## 2022-02-13 ENCOUNTER — Telehealth: Payer: Self-pay | Admitting: Family Medicine

## 2022-02-13 NOTE — Telephone Encounter (Signed)
Noted. Appreciate Dr Rockey Situ seeing pt tomorrow. ?He is currently on 10d course of vancomycin for C diff colitis.  ?

## 2022-02-13 NOTE — Telephone Encounter (Signed)
[facility] - Client ?TELEPHONE ADVICE RECORD ?AccessNurse? ?Patient ?Name: ?[name] ?NN  ?Gender: Male ?DOB: Dec 14, 1945 ?Age: 76 Y 21 M 11 D ?Return ?Phone ?Number: ?[id number] ?(Primary), ?[id number] ?(Secondary) ?Address: ?City/ ?State/ ?Zip: [name] ?[address] ?Client [name] - Client ?Client Site [facility] ?Provider Ria Bush - MD ?Contact Type Call ?Who Is Calling Patient / Member / Family / Caregiver ?Call Type Triage / Clinical ?Caller Name [name] ?Relationship To Patient Spouse ?Return Phone Number [phone] (Primary) ?Chief Complaint Heart palpitations or irregular heartbeat ?Reason for Call Symptomatic / Request for Health Information ?Initial Comment Caller states he has been experiencing low heart ?rate and high blood pressure. ?Translation No ?Nurse Assessment ?Nurse: Rolin Barry RN, Levada Dy Date/Time Eilene Ghazi Time): 02/13/2022 2:35:54 PM ?Confirm and document reason for call. If ?symptomatic, describe symptoms. ?---Caller states he has been experiencing low heart rate ?and high blood pressure. Caller states that his heart rate ?was 39. Caller is at [employer]. Caller thinks that it is his ?medication. Caller is at [employer]. ?Does the patient have any new or worsening ?symptoms? ---Yes ?Will a triage be completed? ---Yes ?Related visit to physician within the last 2 weeks? ---Yes ?Does the PT have any chronic conditions? (i.e. ?diabetes, asthma, this includes High risk factors for ?pregnancy, etc.) ?---Yes ?List chronic conditions. ---A fib bacterial infection, C diff ?Is this a behavioral health or substance abuse call? ---No ?Guidelines ?Guideline Title Affirmed Question Affirmed Notes Nurse Date/Time (Eastern ?Time) ?Heart Rate and ?Heartbeat Questions ?Heart beating very ?slowly (e.g., < 50 / ?minute) (Exception: ?athlete and heart rate ?normal for caller) ?Deaton, RN, Levada Dy 02/13/2022 2:38:42 PM ?Disp. Time  (Eastern ?Time) Disposition Final User ?PLEASE NOTE: All timestamps contained within this report are represented as [place] Standard Time. ?CONFIDENTIALTY NOTICE: This fax transmission is intended only for the addressee. It contains information that is legally privileged, confidential or ?otherwise protected from use or disclosure. If you are not the intended recipient, you are strictly prohibited from reviewing, disclosing, copying using ?or disseminating any of this information or taking any action in reliance on or regarding this information. If you have received this fax in error, please ?notify us immediately by telephone so that we can arrange for its return to [place]. Phone: [phone], Toll-Free: [phone], Fax: [phone] ?Page: 2 of 2 ?Call Id: [id number] ?02/13/2022 2:41:24 PM Go to ED Now Yes Deaton, RN, Levada Dy ?Caller Disagree/Comply Disagree ?Caller Understands Yes ?PreDisposition InappropriateToAsk ?Care Advice Given Per Guideline ?GO TO ED NOW: * You need to be seen in the Emergency Department. * Go to the ED at ___________ [place] now. ?Drive carefully. NOTE TO TRIAGER - DRIVING: * Another adult should drive. * Patient should not delay going to the emergency ?department. * If immediate transportation is not available via car, rideshare (e.g., Lyft, Uber), or taxi, then the patient should be ?instructed to call EMS-911. ANOTHER ADULT SHOULD DRIVE: * It is better and safer if another adult drives instead of you. ?CARE ADVICE given per Heart Rate and Heartbeat Questions (Adult) guideline. ?Comments ?User: Jim Danker, RN Date/Time Eilene Ghazi Time): 02/13/2022 2:55:53 PM ?Caller refused to go to the ED, advised that he will go to his Dr. Hulen Skains the office and spoke with [place], advised ?him of the refusal. Advised that he was according to directive supposed to notify MD and office call him back. ?User: Jim Danker, RN Date/Time Eilene Ghazi Time): 02/13/2022 2:56:51 PM ?Heart rate at this  time was  unknown , caller was in [place]. Caller sated it was 39 earlier. ?Referrals ?GO TO FACILITY REFUSE ?

## 2022-02-13 NOTE — Telephone Encounter (Signed)
STAT if HR is under 50 or over 120 ?(normal HR is 60-100 beats per minute) ? ?What is your heart rate? 40's-50's ? ?Do you have a log of your heart rate readings (document readings)? Usually low but has trouble functioning in 40's  ? ?Do you have any other symptoms? Denies symptoms ?  ?Pcp started on vancomycin abx for infection and patient not sure how affecting heart.  ? ?Scheduled in open slot 3-10 at 420  ? ?

## 2022-02-13 NOTE — Telephone Encounter (Signed)
I spoke with pts wife cathy (DPR signed) [name] is at an appt and pt is at home laying down and resting and [name] thinks he is OK and she will ck BP and P when she gets home. UC & ED precautions given and pt's wife voiced understanding. [name] has already spoke with card and pt has appt with Dr Rockey Situ on 02/14/22 at 4:20.and was advised by card ED precautions also. [name] said she would monitor pt until tomorrow appt with Dr Rockey Situ and will advise pt to go to ED if needed. Sending note to Dr Darnell Level who is out of office and [name] CMA who is out of office. ?

## 2022-02-13 NOTE — Telephone Encounter (Signed)
Triage nurse called by the name of ( Deaton)  stated that pt fell inside of [employer] and he was by himself and he refuse to go to the emergency room for further treatment. Pt also had a low heartbeat ?

## 2022-02-13 NOTE — Telephone Encounter (Signed)
Spoke with patients wife to inquire about medication. She reports he is taking Amiodarone 200 mg once a day. Confirmed appointment for tomorrow and advised if he should worsen then he would need to proceed to local ED. She verbalized understanding of our conversation with no further questions at this time.  ?

## 2022-02-14 ENCOUNTER — Encounter: Payer: Self-pay | Admitting: Family Medicine

## 2022-02-14 ENCOUNTER — Ambulatory Visit (INDEPENDENT_AMBULATORY_CARE_PROVIDER_SITE_OTHER): Payer: Medicare Other | Admitting: Cardiovascular Disease

## 2022-02-14 ENCOUNTER — Other Ambulatory Visit: Payer: Self-pay

## 2022-02-14 ENCOUNTER — Encounter: Payer: Self-pay | Admitting: Cardiovascular Disease

## 2022-02-14 VITALS — BP 130/62 | HR 38 | Ht 72.0 in | Wt 197.1 lb

## 2022-02-14 DIAGNOSIS — I25118 Atherosclerotic heart disease of native coronary artery with other forms of angina pectoris: Secondary | ICD-10-CM | POA: Diagnosis not present

## 2022-02-14 DIAGNOSIS — I480 Paroxysmal atrial fibrillation: Secondary | ICD-10-CM | POA: Diagnosis not present

## 2022-02-14 DIAGNOSIS — I495 Sick sinus syndrome: Secondary | ICD-10-CM

## 2022-02-14 DIAGNOSIS — R001 Bradycardia, unspecified: Secondary | ICD-10-CM

## 2022-02-14 DIAGNOSIS — I714 Abdominal aortic aneurysm, without rupture, unspecified: Secondary | ICD-10-CM | POA: Diagnosis not present

## 2022-02-14 DIAGNOSIS — E1140 Type 2 diabetes mellitus with diabetic neuropathy, unspecified: Secondary | ICD-10-CM | POA: Diagnosis not present
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
## 2022-02-14 NOTE — Patient Instructions (Addendum)
Medication Instructions:  ?Please take amiodarone and metoprolol as needed ? for fast heart rate >100 ?These two medications can slow the heart rate ?Heart rate today is 38 beats per minute ? ?Please monitor heart rate, ?Call if it stays low ? ?If you need a refill on your cardiac medications before your next appointment, please call your pharmacy.  ? ?Lab work: ?No new labs needed ? ?Testing/Procedures: ?No new testing needed ? ?Follow-Up: ?At [facility], you and your health needs are our priority.  As part of our continuing mission to provide you with exceptional heart care, we have created designated Provider Care Teams.  These Care Teams include your primary Cardiologist (physician) and Advanced Practice Providers (APPs -  Physician Assistants and Nurse Practitioners) who all work together to provide you with the care you need, when you need it. ? ?You will need a follow up appointment in 3 months ? ?Providers on your designated Care Team:   ?Murray Hodgkins, NP ?Christell Faith, PA-C ?Cadence Kathlen Mody, PA-C ? ?COVID-19 Vaccine Information can be found at: [email or web address] For questions related to vaccine distribution or appointments, please email [email or web address] or call [phone].  ? ?

## 2022-02-17 ENCOUNTER — Other Ambulatory Visit: Payer: Self-pay | Admitting: *Deleted

## 2022-02-17 ENCOUNTER — Ambulatory Visit
Admission: RE | Admit: 2022-02-17 | Discharge: 2022-02-17 | Disposition: A | Discharge status: Home / Self Care | Payer: Medicare Other | Source: Ambulatory Visit | Attending: Radiation Oncology | Admitting: Radiation Oncology

## 2022-02-17 ENCOUNTER — Other Ambulatory Visit: Payer: Self-pay

## 2022-02-17 VITALS — BP 125/113 | HR 140 | Temp 96.1°F | Resp 18 | Ht 72.0 in | Wt 195.6 lb

## 2022-02-17 DIAGNOSIS — Z923 Personal history of irradiation: Secondary | ICD-10-CM | POA: Insufficient documentation

## 2022-02-17 DIAGNOSIS — C61 Malignant neoplasm of prostate: Secondary | ICD-10-CM | POA: Diagnosis not present

## 2022-02-17 DIAGNOSIS — Z08 Encounter for follow-up examination after completed treatment for malignant neoplasm: Secondary | ICD-10-CM | POA: Diagnosis not present

## 2022-02-17 NOTE — Progress Notes (Signed)
Radiation Oncology ?Follow up Note ? ?Name: [name]   ?Date:   02/17/2022 ?MRN:  [id number] ?DOB: 03/19/1946  ? ? ?This 76 y.o. male presents to the clinic today for 3-year follow-up status post image guided IMRT radiation therapy for Gleason 7 (3+4) adenocarcinoma the prostate. ? ?REFERRING PROVIDER: Ria Bush, MD ? ?HPI: Patient is a 76 year old male now at over 3 years having completed image guided IMRT radiation therapy for a Gleason 7 (3+4) adenocarcinoma the prostate presenting with a PSA of 9.7.  He is seen today in routine follow-up is doing well.  Specifically denies any increased lower urinary tract symptoms diarrhea or fatigue his most recent PSA is 1.07.  This is slightly declined from.  0.09 a year ago. ? ?COMPLICATIONS OF TREATMENT: none ? ?FOLLOW UP COMPLIANCE: keeps appointments  ? ?PHYSICAL EXAM:  ?BP (!) 125/113   Pulse (!) 140   Temp (!) 96.1 ?F (35.6 ?C)   Resp 18   Ht 6' (1.829 m)   Wt 195 lb 9.6 oz (88.7 kg)   BMI 26.53 kg/m?  ?Well-developed well-nourished patient in NAD. HEENT reveals PERLA, EOMI, discs not visualized.  Oral cavity is clear. No oral mucosal lesions are identified. Neck is clear without evidence of cervical or supraclavicular adenopathy. Lungs are clear to A&P. Cardiac examination is essentially unremarkable with regular rate and rhythm without murmur rub or thrill. Abdomen is benign with no organomegaly or masses noted. Motor sensory and DTR levels are equal and symmetric in the upper and lower extremities. Cranial nerves II through XII are grossly intact. Proprioception is intact. No peripheral adenopathy or edema is identified. No motor or sensory levels are noted. Crude visual fields are within normal range. ? ?RADIOLOGY RESULTS: No current films for review ? ?PLAN: Present time patient is under excellent biochemical control of his prostate cancer.  I will see him back in 1 year for follow-up and then will turn follow-up care over to his PMD.  Patient  knows to call with any concerns. ? ?I would like to take this opportunity to thank you for allowing me to participate in the care of your patient.. ?  ? Noreene Filbert, MD ? ?

## 2022-02-18 ENCOUNTER — Other Ambulatory Visit: Payer: Self-pay | Admitting: Family Medicine

## 2022-02-18 DIAGNOSIS — G894 Chronic pain syndrome: Secondary | ICD-10-CM

## 2022-02-19 ENCOUNTER — Other Ambulatory Visit: Payer: Self-pay

## 2022-02-19 ENCOUNTER — Ambulatory Visit (INDEPENDENT_AMBULATORY_CARE_PROVIDER_SITE_OTHER): Payer: Medicare Other

## 2022-02-19 DIAGNOSIS — Z5181 Encounter for therapeutic drug level monitoring: Secondary | ICD-10-CM | POA: Diagnosis not present

## 2022-02-19 DIAGNOSIS — I4891 Unspecified atrial fibrillation: Secondary | ICD-10-CM

## 2022-02-19 LAB — POCT INR: INR: 1.9 — AB (ref 2.0–3.0)

## 2022-02-19 MED ORDER — HYDROCODONE-ACETAMINOPHEN 10-325 MG PO TABS: 1.0000 | ORAL_TABLET | Freq: Four times a day (QID) | ORAL | 0 refills | Status: DC | PRN

## 2022-02-19 NOTE — Telephone Encounter (Signed)
ERx 

## 2022-02-19 NOTE — Telephone Encounter (Signed)
Name of Medication: Hydrocodone apap 10-325 mg ?Name of Pharmacy: [facility] ?Last Fill or Written Date and Quantity: #120 on 01/21/22 ?Last Office Visit and Type: 01/30/2022 for dizziness ?Next Office Visit and Type: none scheduled ?Last Controlled Substance Agreement Date: 07/06/2017 ?Last UDS:07/06/2017 ? ? ?

## 2022-02-19 NOTE — Patient Instructions (Signed)
-   START NEW dosage of warfarin of 1.5 tablets every day EXCEPT 1 TABLET ON MONDAYS & FRIDAYS ?- Recheck INR in 4 weeks ?

## 2022-03-13 ENCOUNTER — Telehealth: Payer: Self-pay

## 2022-03-13 NOTE — Chronic Care Management (AMB) (Signed)
? ? ?Chronic Care Management ?Pharmacy Assistant  ? ?Name: [name]  MRN: [id number] DOB: 01-07-1946 ? ? ? ?Reason for Encounter: General Adherence  ?  ? ?Recent office visits:  ?01/30/22-PCP-Jim Gutierrez,MD-Dizziness and diarrhea.Hold metformin until diarrhea resolved-consider neurology evaluation-labs( Blood counts stable,normal,) stool test ordered(positive for C-diff I have sent vancomycin to pharmacy to take for 10 days. - advised holding metformin  until diarrhea has resolved.  ? ?Recent consult visits:  ?02/14/22-Cardiology-Jim Gollan,MD-follow up bradycardia-EKG-Recommend he stop both the amiodarone and the metoprolol, only take the medication for tachycardia consistent with atrial fibrillation ? ?Hospital visits:  ?Medication Reconciliation was completed by comparing discharge summary, patient?s EMR and Pharmacy list, and upon discussion with patient. ? ?Admitted to the hospital on 12/26/21 due to Bradycardia and Abcess of groin . Discharge date was 12/30/21. Discharged from [facility].   ? ?New?Medications Started at [facility] Discharge:?? ?-started antibiotics ciprofloxacin '500mg'$  ? Tylenol '325mg'$  ? Metronidazole '500mg'$  ? ?Medications that remain the same after Hospital Discharge:??  ?-All other medications will remain the same.   ? ?Medications: ?Outpatient Encounter Medications as of 03/13/2022  ?Medication Sig  ? albuterol (VENTOLIN HFA) 108 (90 Base) MCG/ACT inhaler INHALE 2 PUFFS INTO THE LUNGS EVERY 6 HOURS AS NEEDED FOR WHEEZING OR SHORTNESS OF BREATH  ? amiodarone (PACERONE) 200 MG tablet Take 200 mg by mouth as needed. Take for fast heart rate>100  ? aspirin 81 MG EC tablet Take 1 tablet (81 mg total) by mouth daily. Swallow whole.  ? Budeson-Glycopyrrol-Formoterol (BREZTRI AEROSPHERE) 160-9-4.8 MCG/ACT AERO Inhale 2 puffs into the lungs in the morning and at bedtime.  ? Cholecalciferol (VITAMIN D) 50 MCG (2000 UT) CAPS Take 1 capsule (2,000 Units total) by mouth daily.  ? Coenzyme  Q10 (COQ-10 PO) Take 1 tablet by mouth daily.  ? Cyanocobalamin (B-12) 1000 MCG SUBL Place 1 tablet under the tongue daily.  ? diclofenac Sodium (VOLTAREN) 1 % GEL Apply 4 g topically 3 (three) times daily. (Patient taking differently: Apply 4 g topically 3 (three) times daily. As needed)  ? diltiazem (CARDIZEM) 30 MG tablet Take 1 tablet (30 mg total) by mouth daily as needed (fast heart rate).  ? diphenoxylate-atropine (LOMOTIL) 2.5-0.025 MG tablet Take 1 tablet by mouth 2 (two) times daily as needed.  ? Ferrous Sulfate (IRON) 325 (65 Fe) MG TABS Take by mouth daily.  ? fish oil-omega-3 fatty acids 1000 MG capsule Take 2 g by mouth daily.  ? furosemide (LASIX) 20 MG tablet TAKE 1 TABLET BY MOUTH EVERY DAY AS NEEDED FOR FLUID RETENTION  ? gabapentin (NEURONTIN) 600 MG tablet Take 1 tablet (600 mg total) by mouth 2 (two) times daily.  ? HYDROcodone-acetaminophen (NORCO) 10-325 MG tablet Take 1 tablet by mouth every 6 (six) hours as needed for moderate pain or severe pain.  ? ipratropium-albuterol (DUONEB) 0.5-2.5 (3) MG/3ML SOLN Take 3 mLs by nebulization every 6 (six) hours as needed.  ? metFORMIN (GLUCOPHAGE-XR) 500 MG 24 hr tablet Take 1 tablet (500 mg total) by mouth daily with breakfast.  ? methocarbamol (ROBAXIN) 500 MG tablet Take 1 tablet (500 mg total) by mouth at bedtime as needed for muscle spasms.  ? metoprolol tartrate (LOPRESSOR) 25 MG tablet Take 25 mg by mouth as needed (fast heart rate). Take for fast heart rate>100  ? midodrine (PROAMATINE) 10 MG tablet Take 1 tablet (10 mg total) by mouth 3 (three) times daily as needed.  ? nitroGLYCERIN (NITROLINGUAL) 0.4 MG/SPRAY spray USE 1 SPRAY AS  DIRECTED EVERY 5 MINUTES AS NEEDED  ? omeprazole (PRILOSEC) 40 MG capsule Take 1 capsule (40 mg total) by mouth daily.  ? pravastatin (PRAVACHOL) 20 MG tablet TAKE 1 TABLET(20 MG) BY MOUTH DAILY  ? spironolactone (ALDACTONE) 25 MG tablet Take 0.5 tablets (12.5 mg total) by mouth daily.  ? warfarin (COUMADIN) 2.5 MG  tablet TAKE AS DIRECTED AFTER ANTI-COAG CLINIC  ? ?No facility-administered encounter medications on file as of 03/13/2022.  ? ? ? ? ?Contacted [name] on 03/25/22 for general disease state and medication adherence call.  ? ?Patient is not more than 5 days past due for refill on the following medications per chart history: ? ?[facility] Medications: ?Medication Name/mg Last Fill Days Supply ?Metformin XR '500mg'$   12/11/21  90   PCP put medication on hold  awhile ?Pravastatin '20mg'$   01/23/22 90 ? ? ? ?What concerns do you have about your medications? The patient reports only concern is that the muscle relaxer was written for once a day and he has been taking 4 times a day, 90ds will run out soon. Note sent to PCP also ? ?The patient denies side effects with their medications.  ? ?How often do you forget or accidentally miss a dose? Never ? ?Do you use a pillbox? Yes ? ?Are you having any problems getting your medications from your pharmacy? No ? ?Has the cost of your medications been a concern? No ? ?Since last visit with CPP, the following interventions have been made. 01/30/22-PCP hold off on metformin until diarrhea resolved  ? ?The patient has had an ED visit since last contact. 12/26/21 [facility] -Bradycardia and abscess of right groin ? ?The patient denies problems with their health.  ? ?Patient denies concerns or questions for Charlene Brooke, PharmD at this time.  ? ?Counseled patient on:  ?Importance of taking medication daily without missed doses, Benefits of adherence packaging or a pillbox, and Access to CCM team for any cost, medication or pharmacy concerns. ? ? ?Care Gaps: ?Annual wellness visit in last year? Yes ?Most Recent BP reading:130/62  38-P  02/14/22 ? ?If Diabetic: ?Most recent A1C reading:6.1  12/11/21 ?Last eye exam / retinopathy screening:2021 ?Last diabetic foot exam:2021 ? ?Upcoming appointments: ?Cardiology appointment on 03/19/22  coumadin check  ? ? ? ?Charlene Brooke, CPP notified ? ?Neriyah Cercone,  CCMA ?Health concierge  ?[phone]  ? ? ?Charlene Brooke, CPP notified ? ?Damarea Merkel, CCMA ?Health concierge  ?[phone]  ?

## 2022-03-18 DIAGNOSIS — Z85828 Personal history of other malignant neoplasm of skin: Secondary | ICD-10-CM | POA: Diagnosis not present

## 2022-03-18 DIAGNOSIS — Z08 Encounter for follow-up examination after completed treatment for malignant neoplasm: Secondary | ICD-10-CM | POA: Diagnosis not present

## 2022-03-19 ENCOUNTER — Ambulatory Visit (INDEPENDENT_AMBULATORY_CARE_PROVIDER_SITE_OTHER): Payer: Medicare Other

## 2022-03-19 ENCOUNTER — Other Ambulatory Visit: Payer: Self-pay | Admitting: Family Medicine

## 2022-03-19 DIAGNOSIS — I4891 Unspecified atrial fibrillation: Secondary | ICD-10-CM

## 2022-03-19 DIAGNOSIS — G894 Chronic pain syndrome: Secondary | ICD-10-CM

## 2022-03-19 DIAGNOSIS — Z5181 Encounter for therapeutic drug level monitoring: Secondary | ICD-10-CM | POA: Diagnosis not present

## 2022-03-19 LAB — POCT INR: INR: 1.7 — AB (ref 2.0–3.0)

## 2022-03-19 NOTE — Telephone Encounter (Signed)
Name of Medication: Hydrocodone ?Name of Pharmacy [facility]  ?Last Fill or Written Date and Quantity: 02/19/22 #120 ?Last Office Visit and Type: 01/30/22 ?Next Office Visit and Type: noneeement Date: 07/06/17 ?Last UDS:07/06/17 ? ? ? ?

## 2022-03-19 NOTE — Patient Instructions (Signed)
-   take 2 tablets warfarin tonight, then ?- START NEW dosage of warfarin of 1.5 tablets every day ?- Recheck INR in 3 weeks ?

## 2022-03-20 MED ORDER — HYDROCODONE-ACETAMINOPHEN 10-325 MG PO TABS: 1.0000 | ORAL_TABLET | Freq: Four times a day (QID) | ORAL | 0 refills | Status: DC | PRN

## 2022-03-20 NOTE — Telephone Encounter (Signed)
ERx 

## 2022-03-21 DIAGNOSIS — Z20822 Contact with and (suspected) exposure to covid-19: Secondary | ICD-10-CM | POA: Diagnosis not present

## 2022-03-26 ENCOUNTER — Telehealth: Payer: Self-pay

## 2022-03-26 NOTE — Telephone Encounter (Signed)
Please advise if patient needs to schedule an appointment. ?

## 2022-03-26 NOTE — Telephone Encounter (Signed)
Received this message from [facility] via staff message. Sending to PCP CMA. Please call to schedule patient. Thank you ? ?Avel Sensor, CMA  Dalante Minus, Weldon Picking, CMA ?If you could get this to the right person, I was doing assessment on this patient for CCM and being that I identify from [facility] the patient reported to me that on 2/23 Dr.G wrote him for Robaxin to take 1 tablet at bedtime. The patient states he has taken up to 4 a day  in the past. The 90ds is close to running out so he asked for appointment to discuss with PCP. Thanks  ? ?Velmena Humble, CCMA  ?Health concierge  ?[phone]  ?

## 2022-03-27 NOTE — Telephone Encounter (Signed)
How much is he taking on average of methocarbamol and why? Would ensure he's not having sedation side effect on this medicine.  ?I would be willing to increase robaxin frequency/# if desired.  ?Let me know if he still would want OV after above discussion.  ?

## 2022-03-27 NOTE — Telephone Encounter (Signed)
Spoke with pt asking about Robaxin.  States he's taking med TID, however he would like to go back to taking QID as Dr. Darnell Level had originally prescribed due to increased muscle spasms.  Says he's having muscle spasms about 2x times during the day in R arm and then spasms when lying down in L leg/foot. Pt states Robaxin helps him sleep at night. Pt scheduled OV on 04/01/22 at 12:00.  ?

## 2022-04-01 ENCOUNTER — Encounter: Payer: Self-pay | Admitting: Family Medicine

## 2022-04-01 ENCOUNTER — Ambulatory Visit (INDEPENDENT_AMBULATORY_CARE_PROVIDER_SITE_OTHER): Payer: Medicare Other | Admitting: Family Medicine

## 2022-04-01 VITALS — BP 116/62 | HR 58 | Temp 98.3°F | Ht 72.0 in

## 2022-04-01 DIAGNOSIS — R253 Fasciculation: Secondary | ICD-10-CM | POA: Diagnosis not present

## 2022-04-01 DIAGNOSIS — A498 Other bacterial infections of unspecified site: Secondary | ICD-10-CM

## 2022-04-01 DIAGNOSIS — I25118 Atherosclerotic heart disease of native coronary artery with other forms of angina pectoris: Secondary | ICD-10-CM

## 2022-04-01 DIAGNOSIS — W57XXXA Bitten or stung by nonvenomous insect and other nonvenomous arthropods, initial encounter: Secondary | ICD-10-CM

## 2022-04-01 DIAGNOSIS — S30860A Insect bite (nonvenomous) of lower back and pelvis, initial encounter: Secondary | ICD-10-CM | POA: Diagnosis not present

## 2022-04-01 DIAGNOSIS — I951 Orthostatic hypotension: Secondary | ICD-10-CM | POA: Diagnosis not present

## 2022-04-01 DIAGNOSIS — R197 Diarrhea, unspecified: Secondary | ICD-10-CM

## 2022-04-01 MED ORDER — METHOCARBAMOL 500 MG PO TABS: 500.0000 mg | ORAL_TABLET | Freq: Two times a day (BID) | ORAL | 0 refills | Status: DC | PRN

## 2022-04-01 NOTE — Progress Notes (Signed)
? ? Patient ID: [name], male    DOB: 1946/10/19, 76 y.o.   MRN: [id number] ? ?This visit was conducted in person. ? ?BP 116/62   Pulse (!) 58   Temp 98.3 ?F (36.8 ?C) (Temporal)   Ht 6' (1.829 m)   SpO2 96% Comment: 3 L  BMI 26.53 kg/m?   ? ?CC: muscle spasms  ?Subjective:  ? ?HPI: ?[name] is a 76 y.o. male presenting on 04/01/2022 for Spasms (C/o muscle spasms about 2x times during the day in R arm and then spasms when lying down in L leg/foot.  Taking methocarbamol TID. However, wants to go back to taking QID as originally prescribed due to increased muscle spasms. Pt accompanied by wife, [name]. ) ? ? ?2 wk h/o worsening involuntary movement symptoms to L leg below knee and into foot. No pain. Jerking of left leg only occurs when he lays down in bed at night - cannot sleep due to this. Straightening leg out helps. No trouble when he falls asleep. Some discomfort to ankle during the day. Predominantly uses muscle relaxant at bedtime with benefit, also takes one in the morning.  ? ?No significant right arm symptoms.  ? ?No restless leg symptoms.  ?No burning leg pain or numbness.  ? ?He's been managing with methocarbamol BID.  ? ?No recent falls or syncope since alpha blocker was stopped. Did have fall last week after tripping over pants as he was removing them - hit left abdomen with residual soreness although this is improving.  ? ?No h/o seizures.  ?Tizanidine previously tried was ineffective.  ? ?Diarrhea yesterday x4 - watery stools.  ?Took 2 imodium and 2 lomotil all at once - now constipated.  ? ?Recent tick attached to back Sunday evening - was fully removed.  ?   ? ?Relevant past medical, surgical, family and social history reviewed and updated as indicated. Interim medical history since our last visit reviewed. ?Allergies and medications reviewed and updated. ?Outpatient Medications Prior to Visit  ?Medication Sig Dispense Refill  ? albuterol (VENTOLIN HFA) 108 (90 Base) MCG/ACT inhaler  INHALE 2 PUFFS INTO THE LUNGS EVERY 6 HOURS AS NEEDED FOR WHEEZING OR SHORTNESS OF BREATH 8.5 g 3  ? amiodarone (PACERONE) 200 MG tablet Take 200 mg by mouth as needed. Take for fast heart rate>100    ? aspirin 81 MG EC tablet Take 1 tablet (81 mg total) by mouth daily. Swallow whole.    ? Budeson-Glycopyrrol-Formoterol (BREZTRI AEROSPHERE) 160-9-4.8 MCG/ACT AERO Inhale 2 puffs into the lungs in the morning and at bedtime. 10.7 g 0  ? Cholecalciferol (VITAMIN D) 50 MCG (2000 UT) CAPS Take 1 capsule (2,000 Units total) by mouth daily. 30 capsule   ? Coenzyme Q10 (COQ-10 PO) Take 1 tablet by mouth daily.    ? Cyanocobalamin (B-12) 1000 MCG SUBL Place 1 tablet under the tongue daily.    ? diclofenac Sodium (VOLTAREN) 1 % GEL Apply 4 g topically 3 (three) times daily. (Patient taking differently: Apply 4 g topically 3 (three) times daily. As needed) 100 g 3  ? diltiazem (CARDIZEM) 30 MG tablet Take 1 tablet (30 mg total) by mouth daily as needed (fast heart rate).    ? diphenoxylate-atropine (LOMOTIL) 2.5-0.025 MG tablet Take 1 tablet by mouth 2 (two) times daily as needed. 20 tablet 0  ? Ferrous Sulfate (IRON) 325 (65 Fe) MG TABS Take by mouth daily.    ? fish oil-omega-3 fatty acids 1000 MG capsule  Take 2 g by mouth daily.    ? furosemide (LASIX) 20 MG tablet TAKE 1 TABLET BY MOUTH EVERY DAY AS NEEDED FOR FLUID RETENTION 90 tablet 0  ? gabapentin (NEURONTIN) 600 MG tablet Take 1 tablet (600 mg total) by mouth 2 (two) times daily.    ? HYDROcodone-acetaminophen (NORCO) 10-325 MG tablet Take 1 tablet by mouth every 6 (six) hours as needed for moderate pain or severe pain. 120 tablet 0  ? ipratropium-albuterol (DUONEB) 0.5-2.5 (3) MG/3ML SOLN Take 3 mLs by nebulization every 6 (six) hours as needed. 360 mL 11  ? metFORMIN (GLUCOPHAGE-XR) 500 MG 24 hr tablet Take 1 tablet (500 mg total) by mouth daily with breakfast. 90 tablet 1  ? metoprolol tartrate (LOPRESSOR) 25 MG tablet Take 25 mg by mouth as needed (fast heart  rate). Take for fast heart rate>100    ? midodrine (PROAMATINE) 10 MG tablet Take 1 tablet (10 mg total) by mouth 3 (three) times daily as needed. 90 tablet 1  ? nitroGLYCERIN (NITROLINGUAL) 0.4 MG/SPRAY spray USE 1 SPRAY AS DIRECTED EVERY 5 MINUTES AS NEEDED 4.9 g 1  ? omeprazole (PRILOSEC) 40 MG capsule Take 1 capsule (40 mg total) by mouth daily. 90 capsule 3  ? pravastatin (PRAVACHOL) 20 MG tablet TAKE 1 TABLET(20 MG) BY MOUTH DAILY 90 tablet 3  ? spironolactone (ALDACTONE) 25 MG tablet Take 0.5 tablets (12.5 mg total) by mouth daily.    ? warfarin (COUMADIN) 2.5 MG tablet TAKE AS DIRECTED AFTER ANTI-COAG CLINIC 135 tablet 1  ? methocarbamol (ROBAXIN) 500 MG tablet Take 1 tablet (500 mg total) by mouth at bedtime as needed for muscle spasms. 90 tablet 0  ? ?No facility-administered medications prior to visit.  ?  ? ?Per HPI unless specifically indicated in ROS section below ?Review of Systems ? ?Objective:  ?BP 116/62   Pulse (!) 58   Temp 98.3 ?F (36.8 ?C) (Temporal)   Ht 6' (1.829 m)   SpO2 96% Comment: 3 L  BMI 26.53 kg/m?   ?Wt Readings from Last 3 Encounters:  ?02/17/22 195 lb 9.6 oz (88.7 kg)  ?02/14/22 197 lb 2 oz (89.4 kg)  ?01/30/22 185 lb 2 oz (84 kg)  ?  ?  ?Physical Exam ?Vitals and nursing note reviewed.  ?Constitutional:   ?   Appearance: Normal appearance. He is not ill-appearing.  ?Cardiovascular:  ?   Rate and Rhythm: Normal rate and regular rhythm.  ?   Pulses: Normal pulses.  ?   Heart sounds: Normal heart sounds.  ?Pulmonary:  ?   Effort: Pulmonary effort is normal. No respiratory distress.  ?   Breath sounds: Normal breath sounds. No wheezing, rhonchi or rales.  ?Musculoskeletal:     ?   General: No tenderness. Normal range of motion.  ?   Right lower leg: No edema.  ?   Left lower leg: No edema.  ?   Comments: FROM at L ankle without tenderness to exam  ?Skin: ?   General: Skin is warm and dry.  ?   Findings: No rash.  ?   Comments: Scab to posterior mid back without obvious residual  tick part or significant surrounding erythema  ?Neurological:  ?   Mental Status: He is alert.  ?Psychiatric:     ?   Mood and Affect: Mood normal.     ?   Behavior: Behavior normal.  ? ?   ?Results for orders placed or performed in visit on 03/19/22  ?POCT  INR  ?Result Value Ref Range  ? INR 1.7 (A) 2.0 - 3.0  ? ?*Note: Due to a large number of results and/or encounters for the requested time period, some results have not been displayed. A complete set of results can be found in Results Review.  ? ? ?Assessment & Plan:  ? ?Problem List Items Addressed This Visit   ? ? Syncope due to orthostatic hypotension  ?  Significant improvement off flomax.  ? ?  ?  ? Clostridioides difficile infection  ?  Tested positive for C. difficile infection in March of this year.  I sent in 10-day vancomycin course however he did not fully complete the course.  He does endorse diarrhea has since resolved except for yesterday where he had recurrent episode of watery diarrhea x4 in 1 day.  He took 2 Imodium and 2 Lomotil with subsequent constipation trouble.  Discussed correct use of antidiarrheal medications. ?I also sent him home with another C. difficile stool test in case diarrhea recurs. ? ?  ?  ? Relevant Orders  ? C. difficile GDH and Toxin A/B  ? Jerking - Primary  ?  Denies restless leg symptoms.  Denies leg cramps at this time.  Predominant concern is involuntary jerking that happens at night.  Currently using muscle relaxant as needed for jerking sensations and more noticeable at nighttime.   ?Discussed risks of muscle relaxant including increased sedation, dizziness/drowsiness, confusion and sedation, fall risk with concomitant increased risk of fracture. Also possible side effect of bradycardia and hypotension.  ?Tizanidine previously tried was ineffective.  ?We will continue methocarbamol twice daily number 60/month.  I have sent in 180 tablets today. ?  ?  ? Tick bite of lower back  ?  Tick bite sustained approximately 3  days ago.  I evaluated his lower back-no signs of ongoing infection or residual tick part attached. ?Reviewed symptoms to watch for over the next 7 days that would indicate tickborne illness, and to let us know if such

## 2022-04-01 NOTE — Patient Instructions (Addendum)
Pass by lab to pick up stool kit.  ?Ok to continue robaxin twice a day as needed - if no leg symptoms, don't need to take.  ?After recent tick bite, watch for 7-10 days and let us know if any new fever, rash, abdominal pain or nausea, headache, joint pain.  ?Return in 3 months for follow up visit.  ?

## 2022-04-02 DIAGNOSIS — R253 Fasciculation: Secondary | ICD-10-CM | POA: Insufficient documentation

## 2022-04-02 DIAGNOSIS — S30860A Insect bite (nonvenomous) of lower back and pelvis, initial encounter: Secondary | ICD-10-CM | POA: Insufficient documentation

## 2022-04-02 DIAGNOSIS — W57XXXA Bitten or stung by nonvenomous insect and other nonvenomous arthropods, initial encounter: Secondary | ICD-10-CM

## 2022-04-02 DIAGNOSIS — R251 Tremor, unspecified: Secondary | ICD-10-CM | POA: Insufficient documentation

## 2022-04-02 HISTORY — DX: Bitten or stung by nonvenomous insect and other nonvenomous arthropods, initial encounter: W57.XXXA

## 2022-04-02 HISTORY — DX: Bitten or stung by nonvenomous insect and other nonvenomous arthropods, initial encounter: S30.860A

## 2022-04-02 NOTE — Assessment & Plan Note (Addendum)
Denies restless leg symptoms.  Denies leg cramps at this time.  Predominant concern is involuntary jerking that happens at night.  Currently using muscle relaxant as needed for jerking sensations and more noticeable at nighttime.   ?Discussed risks of muscle relaxant including increased sedation, dizziness/drowsiness, confusion and sedation, fall risk with concomitant increased risk of fracture. Also possible side effect of bradycardia and hypotension.  ?Tizanidine previously tried was ineffective.  ?We will continue methocarbamol twice daily number 60/month.  I have sent in 180 tablets today. ?

## 2022-04-02 NOTE — Assessment & Plan Note (Signed)
Tested positive for C. difficile infection in March of this year.  I sent in 10-day vancomycin course however he did not fully complete the course.  He does endorse diarrhea has since resolved except for yesterday where he had recurrent episode of watery diarrhea x4 in 1 day.  He took 2 Imodium and 2 Lomotil with subsequent constipation trouble.  Discussed correct use of antidiarrheal medications. ?I also sent him home with another C. difficile stool test in case diarrhea recurs. ?

## 2022-04-02 NOTE — Assessment & Plan Note (Signed)
Significant improvement off flomax.  ?

## 2022-04-02 NOTE — Assessment & Plan Note (Signed)
Tick bite sustained approximately 3 days ago.  I evaluated his lower back-no signs of ongoing infection or residual tick part attached. ?Reviewed symptoms to watch for over the next 7 days that would indicate tickborne illness, and to let us know if such symptoms develop. ?

## 2022-04-07 DIAGNOSIS — Z20822 Contact with and (suspected) exposure to covid-19: Secondary | ICD-10-CM | POA: Diagnosis not present

## 2022-04-14 DIAGNOSIS — Z20822 Contact with and (suspected) exposure to covid-19: Secondary | ICD-10-CM | POA: Diagnosis not present

## 2022-04-16 ENCOUNTER — Ambulatory Visit (INDEPENDENT_AMBULATORY_CARE_PROVIDER_SITE_OTHER): Payer: Medicare Other

## 2022-04-16 DIAGNOSIS — I4891 Unspecified atrial fibrillation: Secondary | ICD-10-CM

## 2022-04-16 DIAGNOSIS — Z5181 Encounter for therapeutic drug level monitoring: Secondary | ICD-10-CM | POA: Diagnosis not present

## 2022-04-16 LAB — POCT INR: INR: 2.8 (ref 2.0–3.0)

## 2022-04-16 NOTE — Patient Instructions (Signed)
-   Continue 1.5 tablets every day - Recheck INR in 6 weeks   

## 2022-04-23 ENCOUNTER — Other Ambulatory Visit: Payer: Self-pay | Admitting: Family Medicine

## 2022-04-23 DIAGNOSIS — G894 Chronic pain syndrome: Secondary | ICD-10-CM

## 2022-04-23 MED ORDER — HYDROCODONE-ACETAMINOPHEN 10-325 MG PO TABS: 1.0000 | ORAL_TABLET | Freq: Four times a day (QID) | ORAL | 0 refills | Status: DC | PRN

## 2022-04-23 NOTE — Telephone Encounter (Signed)
Refill request for HYDROcodone-acetaminophen (NORCO) 10-325 MG tablet ? ?LOV - 04/01/22 ?Next OV - not scheduled ?Last refill - 03/20/22 #120/0 ? ?

## 2022-04-23 NOTE — Telephone Encounter (Signed)
ERx 

## 2022-05-10 ENCOUNTER — Other Ambulatory Visit: Payer: Self-pay | Admitting: Cardiovascular Disease

## 2022-05-12 NOTE — Telephone Encounter (Signed)
Refill request

## 2022-05-12 NOTE — Telephone Encounter (Signed)
Received request for warfarin refills:  Last INR was 2.8 on 04/16/22 Next INR due on 05/28/22 LOV was 02/14/22  [name] MD  Refill approved.

## 2022-05-20 ENCOUNTER — Ambulatory Visit: Payer: Medicare Other | Admitting: Cardiovascular Disease

## 2022-05-22 ENCOUNTER — Other Ambulatory Visit: Payer: Self-pay | Admitting: Family Medicine

## 2022-05-22 DIAGNOSIS — G894 Chronic pain syndrome: Secondary | ICD-10-CM

## 2022-05-22 NOTE — Telephone Encounter (Signed)
Name of Medication: Hydrocodone apap 10-325 mg Name of Pharmacy: [facility] Last [place] or Written Date and Quantity: # 120 on 04/23/2022 Last Office Visit and Type: 04/01/22 acute; 12/11/2021 FU Next Office Visit and Type: 09/15/2022 CPX Last Controlled Substance Agreement Date: 02/03/2018 Last UDS:07/06/2017  Sending to Dr Danise Mina.

## 2022-05-23 MED ORDER — HYDROCODONE-ACETAMINOPHEN 10-325 MG PO TABS: 1.0000 | ORAL_TABLET | Freq: Four times a day (QID) | ORAL | 0 refills | Status: DC | PRN

## 2022-05-23 NOTE — Telephone Encounter (Signed)
ERx 

## 2022-05-28 ENCOUNTER — Ambulatory Visit (INDEPENDENT_AMBULATORY_CARE_PROVIDER_SITE_OTHER): Payer: Medicare Other

## 2022-05-28 DIAGNOSIS — Z5181 Encounter for therapeutic drug level monitoring: Secondary | ICD-10-CM

## 2022-05-28 DIAGNOSIS — I4891 Unspecified atrial fibrillation: Secondary | ICD-10-CM | POA: Diagnosis not present

## 2022-05-28 LAB — POCT INR: INR: 2.7 (ref 2.0–3.0)

## 2022-05-28 NOTE — Patient Instructions (Signed)
-   Continue 1.5 tablets every day - Recheck INR in 8 weeks

## 2022-06-11 ENCOUNTER — Telehealth: Payer: Self-pay

## 2022-06-11 NOTE — Chronic Care Management (AMB) (Addendum)
Chronic Care Management Pharmacy Assistant   Name: SEKOU ZUCKERMAN  MRN: [id number] DOB: 07/10/1946   Reason for Encounter: Reminder Call     Recent office visits:  04/01/22-Javier Gutierrez,MD(PCP)-spasms,stool kit given, f/u 3 months  01/30/22-Javier Gutierrez,MD(PCP)-f/u abcess,advised to continue holding metformin due to diarrhea resolved.labs ordered(stable nornal) ,push fluid intake. 12/25/21-PCP-Patient presented for a fall.CT-Head ordered,chest xray,labs ordered(abnormal),start using walker at home 12/11/21-PCP-Patient presented for follow up diabetes- With noted unintentional weight loss, anticipate improved sugar control - will recommend he drop metformin to 500mg  once daily. Labs ordered (new A1c 6.1) other results normal, slightly more anemic, consider starting oral iron OTC. 11/27/21-PCP-Patient presented for follow up hospital stay-protime -INR ordered,no medication changes.   Recent consult visits:  02/14/22- Timothy Gollan,MD(cardio)-f/u bradycardia,EKG-Recommend he take the amiodarone and the metoprolol as needed -only take the medication for tachycardia consistent with atrial fibrillation,monitor BP and keep log.smoking cessation discussed with him-f/u 3 months 12/03/21-[facility]-Patient presented for follow up hernia repair. Suture removal,continue wound care, no medication changes  11/22/21-[facility]-Patient presented for follow up hernia repair. Wound cleaned,drain removed,prescribed Dakin's. 11/12/21- [facility]-Patient presented for right inguinal hernia repair- Bactrim and Flagyl till December 16th -discharged to home. 11/03/21-[facility]- Patient presented for increase symptoms associated with right inguinal hernia CT-Scan ordered,.Started on IV fluids ,scheduled for surgery 11/04/21- discharged to home,started on Hydromorphone 2mg  and senokot.  Hospital visits:  Medication Reconciliation was completed by comparing discharge summary, patient's  EMR and Pharmacy list, and upon discussion with patient.  Admitted to the hospital on 12/26/21 due to bradycardia. Discharge date was 12/30/21. Discharged from [facility]?Medications Started at [facility] Discharge:?? -started ciprofloxin 500mg   Acetaminophen  325mg   Metronidazole 500 mg -Stop  medications  Diltiazem 30mg   Diphenoxylate-atropine 2.5mg   Hydrocodone 10/325mg   Methocarbamol 500mg   Metoprolol tartrate 25mg   Sennosides 8.6mg   Medications that remain the same after Hospital Discharge:??  -All other medications will remain the same.    Medications: Outpatient Encounter Medications as of 06/11/2022  Medication Sig   albuterol (VENTOLIN HFA) 108 (90 Base) MCG/ACT inhaler INHALE 2 PUFFS INTO THE LUNGS EVERY 6 HOURS AS NEEDED FOR WHEEZING OR SHORTNESS OF BREATH   amiodarone (PACERONE) 200 MG tablet Take 200 mg by mouth as needed. Take for fast heart rate>100   aspirin 81 MG EC tablet Take 1 tablet (81 mg total) by mouth daily. Swallow whole.   Budeson-Glycopyrrol-Formoterol (BREZTRI AEROSPHERE) 160-9-4.8 MCG/ACT AERO Inhale 2 puffs into the lungs in the morning and at bedtime.   Cholecalciferol (VITAMIN D) 50 MCG (2000 UT) CAPS Take 1 capsule (2,000 Units total) by mouth daily.   Coenzyme Q10 (COQ-10 PO) Take 1 tablet by mouth daily.   Cyanocobalamin (B-12) 1000 MCG SUBL Place 1 tablet under the tongue daily.   diclofenac Sodium (VOLTAREN) 1 % GEL Apply 4 g topically 3 (three) times daily. (Patient taking differently: Apply 4 g topically 3 (three) times daily. As needed)   diltiazem (CARDIZEM) 30 MG tablet Take 1 tablet (30 mg total) by mouth daily as needed (fast heart rate).   diphenoxylate-atropine (LOMOTIL) 2.5-0.025 MG tablet Take 1 tablet by mouth 2 (two) times daily as needed.   Ferrous Sulfate (IRON) 325 (65 Fe) MG TABS Take by mouth daily.   fish oil-omega-3 fatty acids 1000 MG capsule Take 2 g by mouth daily.   furosemide (LASIX) 20 MG tablet TAKE 1  TABLET BY MOUTH EVERY DAY AS NEEDED FOR FLUID RETENTION   gabapentin (  NEURONTIN) 600 MG tablet Take 1 tablet (600 mg total) by mouth 2 (two) times daily.   HYDROcodone-acetaminophen (NORCO) 10-325 MG tablet Take 1 tablet by mouth every 6 (six) hours as needed for moderate pain or severe pain.   ipratropium-albuterol (DUONEB) 0.5-2.5 (3) MG/3ML SOLN Take 3 mLs by nebulization every 6 (six) hours as needed.   metFORMIN (GLUCOPHAGE-XR) 500 MG 24 hr tablet Take 1 tablet (500 mg total) by mouth daily with breakfast.   methocarbamol (ROBAXIN) 500 MG tablet Take 1 tablet (500 mg total) by mouth 2 (two) times daily as needed for muscle spasms (sedation precautions).   metoprolol tartrate (LOPRESSOR) 25 MG tablet Take 25 mg by mouth as needed (fast heart rate). Take for fast heart rate>100   midodrine (PROAMATINE) 10 MG tablet Take 1 tablet (10 mg total) by mouth 3 (three) times daily as needed.   nitroGLYCERIN (NITROLINGUAL) 0.4 MG/SPRAY spray USE 1 SPRAY AS DIRECTED EVERY 5 MINUTES AS NEEDED   omeprazole (PRILOSEC) 40 MG capsule Take 1 capsule (40 mg total) by mouth daily.   pravastatin (PRAVACHOL) 20 MG tablet TAKE 1 TABLET(20 MG) BY MOUTH DAILY   spironolactone (ALDACTONE) 25 MG tablet Take 0.5 tablets (12.5 mg total) by mouth daily.   warfarin (COUMADIN) 2.5 MG tablet TAKE BY MOUTH AS DIRECTED AFTER ANTI-COAG CLINIC   No facility-administered encounter medications on file as of 06/11/2022.   [name] was contacted to remind of upcoming telephone visit with Charlene Brooke  on 06/16/22 at 3:00pm. Patient was reminded to have any blood glucose and blood pressure readings available for review at appointment.   Message was left reminding patient of appointment.  Patients wife called to reschedule appointment for 06/30/22  at 2:15pm.  CCM referral has been placed prior to visit?  Yes    Star Rating Drugs: Medication:   Last Fill: Day Supply Metformin XR 500mg   12/11/21  90  Note from MD says he  could stop metformin awhile due to diarrhea. Pravastatin 20mg   04/23/22 [place], CPP notified  Avel Sensor, [place]  [phone]

## 2022-06-16 ENCOUNTER — Telehealth: Payer: Medicare Other

## 2022-06-20 ENCOUNTER — Other Ambulatory Visit: Payer: Self-pay | Admitting: Cardiovascular Disease

## 2022-06-23 ENCOUNTER — Encounter: Payer: Self-pay | Admitting: Family Medicine

## 2022-06-23 MED ORDER — ALBUTEROL SULFATE HFA 108 (90 BASE) MCG/ACT IN AERS: 2.0000 | INHALATION_SPRAY | Freq: Four times a day (QID) | RESPIRATORY_TRACT | 3 refills | Status: DC | PRN

## 2022-06-23 NOTE — Telephone Encounter (Signed)
E-scribed refill 

## 2022-06-25 ENCOUNTER — Other Ambulatory Visit: Payer: Self-pay | Admitting: Family Medicine

## 2022-06-25 DIAGNOSIS — G894 Chronic pain syndrome: Secondary | ICD-10-CM

## 2022-06-26 NOTE — Telephone Encounter (Signed)
Name of Medication: Hydrocodone-APAP Name of Pharmacy: [facility] Last Fill or Written Date and Quantity: 05/23/22, #120 Last Office Visit and Type: 04/01/22, muscle spasms  Next Office Visit and Type: 09/15/22, CPE Last Controlled Substance Agreement Date: 07/06/17 Last UDS: 02/03/18

## 2022-06-27 MED ORDER — HYDROCODONE-ACETAMINOPHEN 10-325 MG PO TABS: 1.0000 | ORAL_TABLET | Freq: Four times a day (QID) | ORAL | 0 refills | Status: DC | PRN

## 2022-06-27 NOTE — Telephone Encounter (Signed)
ERx 

## 2022-06-30 ENCOUNTER — Ambulatory Visit (INDEPENDENT_AMBULATORY_CARE_PROVIDER_SITE_OTHER): Payer: Medicare Other | Admitting: Pharmacist

## 2022-06-30 ENCOUNTER — Telehealth: Payer: Self-pay | Admitting: Pharmacist

## 2022-06-30 ENCOUNTER — Telehealth: Payer: Self-pay | Admitting: Family Medicine

## 2022-06-30 DIAGNOSIS — E782 Mixed hyperlipidemia: Secondary | ICD-10-CM

## 2022-06-30 DIAGNOSIS — I4891 Unspecified atrial fibrillation: Secondary | ICD-10-CM

## 2022-06-30 DIAGNOSIS — E1140 Type 2 diabetes mellitus with diabetic neuropathy, unspecified: Secondary | ICD-10-CM

## 2022-06-30 DIAGNOSIS — I5032 Chronic diastolic (congestive) heart failure: Secondary | ICD-10-CM

## 2022-06-30 NOTE — Telephone Encounter (Signed)
  Chronic Care Management   Outreach Note  06/30/2022 Name: [name] MRN: [id number] DOB: September 28, 1946  Referred by: Ria Bush, MD  Patient had a phone appointment scheduled with clinical pharmacist today.  An unsuccessful telephone outreach was attempted today. The patient was referred to the pharmacist for assistance with medications, care management and care coordination.   Patient will NOT be penalized in any way for missing a CCM appointment. The no-show fee does not apply.  If possible, a message was left to return call to: [phone] or to [facility].  Charlene Brooke, PharmD, BCACP Clinical Pharmacist [facility] [phone]

## 2022-06-30 NOTE — Progress Notes (Signed)
Chronic Care Management Pharmacy Note  07/07/2022 Name:  [name] MRN:  [id number] DOB:  01/01/1946  Summary: CCM F/U visit  -Reviewed medications; pt endorses compliance -COPD: pt reports Judithann Sauger is expensive; he qualifies for PAP, enrolled in AZ&Me   Recommendations/Changes made from today's visit: -Coordinate with pulmonology for [facility] to AZ&Me ([employer])  Plan: -[place] will call patient 4 months for COPD update -Pharmacist follow up televisit scheduled for 6 months -PCP CPE 09/15/22    Subjective: [name] is an 76 y.o. year old male who is a primary patient of Ria Bush, MD.  The CCM team was consulted for assistance with disease management and care coordination needs.    Engaged with patient by telephone for follow up visit in response to provider referral for pharmacy case management and/or care coordination services.   Consent to Services:  The patient was given information about Chronic Care Management services, agreed to services, and gave verbal consent prior to initiation of services.  Please see initial visit note for detailed documentation.   Patient Care Team: Ria Bush, MD as PCP - General (Family Medicine) Minna Merritts, MD (Cardiology) Thomasene Ripple, MD as Attending Physician (Vascular Surgery) Noreene Filbert, MD as Radiation Oncologist (Radiation Oncology)  Recent office visits: 04/01/22-Javier Gutierrez,MD(PCP)-spasms/jerking - continue methocarbamol. Hx Cdiff -stool kit given, f/u 3 months   01/30/22-Javier Gutierrez,MD(PCP)-f/u abcess,advised to continue holding metformin due to diarrhea resolved.labs ordered(stable nornal) ,push fluid intake. 12/25/21-PCP-Patient presented for a fall.CT-Head ordered,chest xray,labs ordered(abnormal),start using walker at home 12/11/21-PCP-Patient presented for follow up diabetes- With noted unintentional weight loss, anticipate improved sugar control - will recommend he drop  metformin to $RemoveBefo'500mg'etziQlkEylD$  once daily. Labs ordered (new A1c 6.1) other results normal, slightly more anemic, consider starting oral iron OTC. 11/27/21-PCP-Patient presented for follow up hospital stay-protime -INR ordered,no medication changes.   Recent consult visits: 02/14/22- Danella Deis (Cardiology): f/u bradycardia. Stop metoprolol and amiodaroine, only take PRN for tachycardia c/w Afib. 12/03/21-[facility]- Hernia repair. Suture removal. 11/22/21-[facility]-Hernia repair f/u. 11/12/21- [facility]-Patient presented for right inguinal hernia repair  Hospital visits: 12/26/21-12/30/21 Admission ([facility]): sepsis d/t abscess. Pt left AMA - fall risk.    Objective:  Lab Results  Component Value Date   CREATININE 0.95 01/30/2022   BUN 17 01/30/2022   GFR 77.08 01/30/2022   GFRNONAA 55 (L) 11/03/2021   GFRAA 88 09/07/2017   NA 140 01/30/2022   K 4.3 01/30/2022   CALCIUM 9.1 01/30/2022   CO2 33 (H) 01/30/2022   GLUCOSE 104 (H) 01/30/2022    Lab Results  Component Value Date/Time   HGBA1C 6.1 12/11/2021 11:53 AM   HGBA1C 6.9 (H) 09/10/2021 01:22 PM   GFR 77.08 01/30/2022 11:32 AM   GFR 81.24 12/11/2021 11:53 AM   MICROALBUR 4.9 (H) 09/10/2021 01:22 PM   MICROALBUR 3.3 (H) 04/04/2021 11:24 AM    Last diabetic Eye exam:  Lab Results  Component Value Date/Time   HMDIABEYEEXA No Retinopathy 12/30/2019 12:00 AM    Last diabetic Foot exam: No results found for: "HMDIABFOOTEX"   Lab Results  Component Value Date   CHOL 145 09/10/2021   HDL 42.70 09/10/2021   LDLCALC 69 09/10/2021   LDLDIRECT 152.0 06/08/2015   TRIG 165.0 (H) 09/10/2021   CHOLHDL 3 09/10/2021       Latest Ref Rng & Units 01/30/2022   11:32 AM 12/11/2021   11:53 AM 09/10/2021    1:22 PM  Hepatic Function  Total Protein 6.0 - 8.3 g/dL 6.5  6.1  6.2   Albumin 3.5 - 5.2 g/dL 3.5  3.2  3.7   AST 0 - 37 U/L $Remo'16  11  20   'rpTot$ ALT 0 - 53 U/L $Remo'11  8  15   'WOLuA$ Alk Phosphatase 39 - 117 U/L 73  69  77    Total Bilirubin 0.2 - 1.2 mg/dL 0.5  0.7  0.5     Lab Results  Component Value Date/Time   TSH 3.24 12/11/2021 11:53 AM   TSH 2.97 09/10/2021 01:22 PM   FREET4 1.07 10/09/2017 10:30 AM       Latest Ref Rng & Units 01/30/2022   11:32 AM 12/25/2021   12:08 PM 12/11/2021   11:53 AM  CBC  WBC 4.0 - 10.5 K/uL 8.7  14.6  13.0   Hemoglobin 13.0 - 17.0 g/dL 11.8  10.6  11.7   Hematocrit 39.0 - 52.0 % 37.0  33.3  37.0   Platelets 150.0 - 400.0 K/uL 266.0  295.0  361.0     Lab Results  Component Value Date/Time   VD25OH 30.74 09/10/2021 01:22 PM   VD25OH 31.53 04/04/2021 11:24 AM    Clinical ASCVD: Yes  The 10-year ASCVD risk score (Arnett DK, et al., 2019) is: 47.8%   Values used to calculate the score:     Age: 76 years     Sex: Male     Is Non-Hispanic African American: No     Diabetic: Yes     Tobacco smoker: Yes     Systolic Blood Pressure: 751 mmHg     Is BP treated: Yes     HDL Cholesterol: 42.7 mg/dL     Total Cholesterol: 145 mg/dL    CHA2DS2/VAS Stroke Risk Points  Current as of 3 days ago     5 >= 2 Points: High Risk  1 - 1.99 Points: Medium Risk  0 Points: Low Risk    Last Change: N/A       Points Metrics  1 Has Congestive Heart Failure:  Yes    Current as of 3 days ago  0 Has Vascular Disease:  No    Current as of 3 days ago  1 Has Hypertension:  Yes    Current as of 3 days ago  2 Age:  76    Current as of 3 days ago  1 Has Diabetes:  Yes    Current as of 3 days ago  0 Had Stroke:  No  Had TIA:  No  Had Thromboembolism:  No    Current as of 3 days ago  76 Male:  No    Current as of 3 days ago        09/10/2021   12:13 PM 05/02/2020    4:16 PM 04/18/2019   12:50 PM  Depression screen PHQ 2/9  Decreased Interest 0 0 0  Down, Depressed, Hopeless 1 0 0  PHQ - 2 Score 1 0 0  Altered sleeping   0  Tired, decreased energy   0  Change in appetite   0  Feeling bad or failure about yourself    0  Trouble concentrating   0  Moving slowly or  fidgety/restless   0  Suicidal thoughts   0  PHQ-9 Score   0  Difficult doing work/chores   Not difficult at all     Social History   Tobacco Use  Smoking Status Some Days   Packs/day:  2.50   Years: 65.00   Total pack years: 162.50   Types: Cigarettes   Last attempt to quit: 10/28/2021   Years since quitting: 0.6  Smokeless Tobacco Never  Tobacco Comments   1ppd 08/20/2021   BP Readings from Last 3 Encounters:  04/01/22 116/62  02/17/22 (!) 125/113  02/14/22 130/62   Pulse Readings from Last 3 Encounters:  04/01/22 (!) 58  02/17/22 (!) 140  02/14/22 (!) 38   Wt Readings from Last 3 Encounters:  02/17/22 195 lb 9.6 oz (88.7 kg)  02/14/22 197 lb 2 oz (89.4 kg)  01/30/22 185 lb 2 oz (84 kg)   BMI Readings from Last 3 Encounters:  04/01/22 26.53 kg/m  02/17/22 26.53 kg/m  02/14/22 26.73 kg/m    Assessment/Interventions: Review of patient past medical history, allergies, medications, health status, including review of consultants reports, laboratory and other test data, was performed as part of comprehensive evaluation and provision of chronic care management services.   SDOH:  (Social Determinants of Health) assessments and interventions performed: No  SDOH Screenings   Alcohol Screen: Not on file  Depression (PHQ2-9): Low Risk  (09/10/2021)   Depression (PHQ2-9)    PHQ-2 Score: 1  Financial Resource Strain: High Risk (07/05/2021)   Overall Financial Resource Strain (CARDIA)    Difficulty of Paying Living Expenses: Hard  Food Insecurity: Not on file  Housing: Not on file  Physical Activity: Not on file  Social Connections: Not on file  Stress: Not on file  Tobacco Use: High Risk (04/01/2022)   Patient History    Smoking Tobacco Use: Some Days    Smokeless Tobacco Use: Never    Passive Exposure: Not on file  Transportation Needs: Not on file    [place]  Allergies  Allergen Reactions   Oxycodone Hcl Shortness Of Breath   Glipizide Diarrhea    Metformin And Related Diarrhea    Trouble tolerating even extended release metformin   Sitagliptin Diarrhea   Varenicline Tartrate Other (See Comments)    REACTION: hallucinations, but on retrial did well   Wellbutrin [Bupropion Hcl] Other (See Comments)    Hallucinations   Zocor [Simvastatin] Other (See Comments)    Muscle pain    Medications Reviewed Today     Reviewed by Ria Bush, MD (Physician) on 04/01/22 at 1216  Med List Status: <None>   Medication Order Taking? Sig Documenting Provider Last Dose Status Informant  albuterol (VENTOLIN HFA) 108 (90 Base) MCG/ACT inhaler [id number] Yes INHALE 2 PUFFS INTO THE LUNGS EVERY 6 HOURS AS NEEDED FOR WHEEZING OR SHORTNESS OF BREATH Gollan, Kathlene November, MD Taking Active   amiodarone (PACERONE) 200 MG tablet [id number] Yes Take 200 mg by mouth as needed. Take for fast heart rate>100 Minna Merritts, MD Taking Active   aspirin 81 MG EC tablet [id number] Yes Take 1 tablet (81 mg total) by mouth daily. Swallow whole. Ria Bush, MD Taking Active   Budeson-Glycopyrrol-Formoterol [facility] AEROSPHERE) 160-9-4.8 MCG/ACT Hollie Salk [id number] Yes Inhale 2 puffs into the lungs in the morning and at bedtime. Tyler Pita, MD Taking Active   Cholecalciferol (VITAMIN D) 50 MCG (2000 UT) CAPS [id number] Yes Take 1 capsule (2,000 Units total) by mouth daily. Ria Bush, MD Taking Active   Coenzyme Q10 (COQ-10 PO) [id number] Yes Take 1 tablet by mouth daily. [provider] Taking Active   Cyanocobalamin (B-12) 1000 MCG SUBL [id number] Yes Place 1 tablet under the tongue daily. Ria Bush, MD Taking Active   diclofenac  Sodium (VOLTAREN) 1 % GEL [id number] Yes Apply 4 g topically 3 (three) times daily.  Patient taking differently: Apply 4 g topically 3 (three) times daily. As needed   Ria Bush, MD Taking Active   diltiazem (CARDIZEM) 30 MG tablet [id number] Yes Take 1 tablet (30 mg total) by mouth daily as needed (fast  heart rate). Ria Bush, MD Taking Active   diphenoxylate-atropine (LOMOTIL) 2.5-0.025 MG tablet [id number] Yes Take 1 tablet by mouth 2 (two) times daily as needed. Ria Bush, MD Taking Active   Ferrous Sulfate (IRON) 325 (65 Fe) MG TABS [id number] Yes Take by mouth daily. [provider] Taking Active Self  fish oil-omega-3 fatty acids 1000 MG capsule [id number] Yes Take 2 g by mouth daily. [provider] Taking Active Spouse/Significant Other  furosemide (LASIX) 20 MG tablet [id number] Yes TAKE 1 TABLET BY MOUTH EVERY DAY AS NEEDED FOR FLUID RETENTION Gollan, Kathlene November, MD Taking Active   gabapentin (NEURONTIN) 600 MG tablet [id number] Yes Take 1 tablet (600 mg total) by mouth 2 (two) times daily. Ria Bush, MD Taking Active   HYDROcodone-acetaminophen [facility]) 10-325 MG tablet [id number] Yes Take 1 tablet by mouth every 6 (six) hours as needed for moderate pain or severe pain. Ria Bush, MD Taking Active   ipratropium-albuterol (DUONEB) 0.5-2.5 (3) MG/3ML SOLN [id number] Yes Take 3 mLs by nebulization every 6 (six) hours as needed. Laurin Coder, MD Taking Active   metFORMIN (GLUCOPHAGE-XR) 500 MG 24 hr tablet [id number] Yes Take 1 tablet (500 mg total) by mouth daily with breakfast. Ria Bush, MD Taking Active   methocarbamol (ROBAXIN) 500 MG tablet [id number] Yes Take 1 tablet (500 mg total) by mouth at bedtime as needed for muscle spasms. Ria Bush, MD Taking Active   metoprolol tartrate (LOPRESSOR) 25 MG tablet [id number] Yes Take 25 mg by mouth as needed (fast heart rate). Take for fast heart rate>100 Minna Merritts, MD Taking Active   midodrine (PROAMATINE) 10 MG tablet [id number] Yes Take 1 tablet (10 mg total) by mouth 3 (three) times daily as needed. Minna Merritts, MD Taking Active   nitroGLYCERIN (NITROLINGUAL) 0.4 MG/SPRAY spray [id number] Yes USE 1 SPRAY AS DIRECTED EVERY 5 MINUTES AS NEEDED Ria Bush, MD Taking  Active   omeprazole (PRILOSEC) 40 MG capsule [id number] Yes Take 1 capsule (40 mg total) by mouth daily. Ria Bush, MD Taking Active   pravastatin (PRAVACHOL) 20 MG tablet [id number] Yes TAKE 1 TABLET(20 MG) BY MOUTH DAILY Ria Bush, MD Taking Active   spironolactone (ALDACTONE) 25 MG tablet [id number] Yes Take 0.5 tablets (12.5 mg total) by mouth daily. Ria Bush, MD Taking Active   warfarin (COUMADIN) 2.5 MG tablet [id number] Yes TAKE AS DIRECTED AFTER ANTI-COAG CLINIC Gollan, Kathlene November, MD Taking Active   Med List Note Ignatius Specking, RN 01/04/18 1000): 09/07/17 MR 02/03/18  Start hydrcodone 10-325            Patient Active Problem List   Diagnosis Date Noted   Jerking 04/02/2022   Tick bite of lower back 04/02/2022   Clostridioides difficile infection 02/06/2022   Postprocedural intraabdominal abscess 02/01/2022   Anemia 12/25/2021   Acute bilateral low back pain without sciatica 12/12/2021   History of right inguinal hernia repair 11/04/2021   Impaired mobility 08/26/2021   Epidermal cyst 08/09/2021   Mass of scalp 06/15/2021   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 mellitus, with fat layer exposed ([place]) 04/19/2021   Tremor 03/13/2021  Right leg pain 01/21/2021   Syncope due to orthostatic hypotension 01/03/2021   Rib pain on right side 01/03/2021   Skin lesion 09/06/2020   Skin rash 06/07/2020   Peripheral neuropathy 05/21/2020   Medicare annual wellness visit, subsequent 05/02/2020   Encounter for power mobility device assessment 05/01/2020   General unsteadiness 05/01/2020   Burn of left lower leg 04/16/2020   Right foot pain 07/25/2019   Nocturia 04/22/2019   Chronic left shoulder pain 02/08/2019   Fall with injury 12/15/2018   Fatty pancreas 06/01/2018   Right inguinal hernia 01/27/2018   Thoracic aortic atherosclerosis ([place]) 01/23/2018   Atherosclerotic 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 ([place]) 01/23/2018   PAH (pulmonary artery hypertension) ([place]) 01/23/2018   Advanced care planning/counseling discussion 01/09/2018   Prostate cancer ([place]) 12/18/2017   Vitamin B12 deficiency 12/16/2017   Vitamin D insufficiency 12/16/2017   Osteoarthritis 12/14/2017   Bilateral shoulder pain 12/14/2017   Iron deficiency anemia 12/08/2017   Sick sinus syndrome ([place]) 10/09/2017   Opiate dependence ([place]) 09/07/2017   CHF (congestive heart failure) ([place]) 07/24/2017   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   Other bursal cyst, left hand 01/23/2017   Recent head trauma 01/23/2017   Encounter for chronic pain management 12/09/2016   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   Current use of long term anticoagulation (Coumadin) 06/30/2016   Chronic pain syndrome 03/07/2016   Chronic low back pain ([place]) (Bilateral) 12/18/2015   Unexplained weight loss 06/08/2015   Abnormal drug screen 06/08/2015   Diarrhea 04/24/2013   DDD (degenerative disc disease), cervical 04/03/2012   OSA (obstructive sleep apnea) 03/08/2012   AAA (abdominal aortic aneurysm) without rupture ([place]) 10/22/2011   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/20/2011   Type 2 diabetes, controlled, with neuropathy ([place]) 02/27/2011   Diabetic polyneuropathy ([place]) 02/27/2011   Dyspnea on exertion 12/30/2010   Personal history of noncompliance with medical treatment, presenting hazards to health 11/28/2010   HTN (hypertension) 10/17/2010   Hyperlipidemia 09/19/2010   ABNORMAL ELECTROCARDIOGRAM 09/19/2010   Atrial fibrillation ([place]) 09/18/2010   Esophageal reflux 04/21/2010   LEG CRAMPS, IDIOPATHIC 04/21/2010   Fatty liver 04/21/2010   Tobacco abuse 03/28/2010   HEMATURIA, HX OF 03/28/2010   COPD mixed type ([place]) 03/21/2010    Immunization History  Administered Date(s) Administered   PFIZER(Purple Top)SARS-COV-2 Vaccination 03/06/2020, 03/27/2020, 10/05/2020    PNEUMOCOCCAL CONJUGATE-20 09/10/2021   Td 04/30/2020    Conditions to be addressed/monitored:  Hypertension, Hyperlipidemia, Atrial Fibrillation, Heart Failure, and Coronary Artery Disease  Care Plan : CCM Pharmacy Care PLan  Updates made by Charlton Haws, [place] since 07/07/2022 12:00 AM     Problem: Hypertension, Hyperlipidemia, Atrial Fibrillation, Heart Failure, and Coronary Artery Disease   Priority: High  Onset Date: 07/05/2021     Long-Range Goal: Disease mgmt   Start Date: 07/07/2022  Expected End Date: 07/08/2023  This Visit's Progress: On track  Priority: High  Note:   Current Barriers:  Unable to independently afford treatment regimen  Pharmacist Clinical Goal(s):  Patient will verbalize ability to afford treatment regimen through collaboration with PharmD and provider.   Interventions: 1:1 collaboration with Ria Bush, MD regarding development and update of comprehensive plan of care as evidenced by provider attestation and co-signature Inter-disciplinary care team collaboration (see longitudinal plan of care) Comprehensive medication review performed; medication list updated in electronic medical record  Hypertension / Heart Failure (BP goal <140/90) -Controlled -  per home readings; he reports he has not needed midodrine lately -Current home readings: 119/67 - 125/80, HR 60-70 -Denies hypotensive/hypertensive symptoms -Last ejection fraction: >55% (Date: 11/2021) -HF type: Diastolic; NYHA Class: not on file -Considerations: hx orthostatic hypotension (sometimes on midodrine); Tobacco abuse, not interested in quitting -Current treatment: Amlodipine 5 mg PRN (only takes for SBP > 160) - Appropriate, Effective, Safe, Accessible Furosemide 20 mg daily PRN -Appropriate, Effective, Safe, Accessible Metoprolol tartrate 25 mg BID -Appropriate, Effective, Safe, Accessible Spironolactone 25 mg - 1/2 tab daily -Appropriate, Effective, Safe, Accessible Diltiazem  30 mg PRN -Appropriate, Effective, Safe, Accessible Midodrine 10 mg TID PRN -not needed currently -Medications previously tried: none reported -Educated on BP goals and benefits of medications for prevention of heart attack, stroke and kidney damage;  -Counseled to monitor BP at home daily -Recommended to continue current medication  Atrial Fibrillation (Goal: prevent stroke and major bleeding) -Controlled -CHADSVASC: 5; hx sick sinus syndrome -Current treatment: Metoprolol tartrate 25 mg BID - Appropriate, Effective, Safe, Accessible Diltiazem 30 mg PRN (HR > ?) -Appropriate, Effective, Safe, Accessible Amiodarone 200 mg PRN (HR > 100) - Query Appropriate Warfarin 2.5 mg as directed - Appropriate, Effective, Safe, Accessible -Medications previously tried: n/a -Reviewed utility of PRN amiodarone - with half life of amiodarone ~40 days, using it PRN is not typically done; however pt appears controlled this way and cardiologist is aware he is taking it this way, reasonable to continue as is -Recommended to continue current medication  Hyperlipidemia: (LDL goal < 70) -Controlled - LDL 69 (09/2021) at goal -Current treatment: Pravastatin 20 mg daily -Appropriate, Effective, Safe, Accessible Aspirin 81 mg daily -Appropriate, Effective, Safe, Accessible Coenezyme Q10 -Appropriate, Effective, Safe, Accessible Omega 3 Fish oil -Appropriate, Effective, Safe, Accessible Nitroglyercin 0.4 mg SL prn -Appropriate, Effective, Safe, Accessible -Medications previously tried: n/a  -Educated on Cholesterol goals;  -Recommended to continue current medication  Diabetes (A1c goal <7%) -Controlled - A1c 6.1% (12/2021) -Hx peripheral neuropathy, diabetic foot ulcer (L toe) -Current home glucose readings - checks a couple times a week fasting glucose: 150  post prandial glucose: 150 (2 hours after meal) -Denies hypoglycemic/hyperglycemic symptoms -Current medications: Metformin 500 mg daily  AM -Medications previously tried: none  -Educated on A1c and blood sugar goals; -Counseled to check feet daily and get yearly eye exams -Recommended to continue current medication; Schedule annual diabetic foot exam.  COPD, mixed type (Goal: control symptoms and prevent exacerbations) -Not ideally controlled - pt reports Judithann Sauger is expensive so he rations it somewhat, takes once daily or QOD -Tobacco use - 2.5 PPD -Follows with pulmonary - Dr Merrie Roof (last OV 08/20/21) -Current treatment  Breztri 160-9-4.8 mcg/act 2 puff BID - Appropriate, Effective, Safe, Query Accessible Albuterol HFA - Appropriate, Effective, Safe, Accessible Duoneb PRN - Appropriate, Effective, Safe, Accessible -Medications previously tried: n/a  -Pulmonary function testing: not on file -Exacerbations requiring treatment in last 6 months: 0 -Patient denies consistent use of maintenance inhaler -Frequency of rescue inhaler use: PRN -Counseled on [place] inhaler technique;Benefits of consistent maintenance inhaler use -Assessed patient finances. He qualifies for AZ&me - enrolled in PAP and consulted with pulmonology for [facility] Rx refill -Recommend to continue current medication  Health Maintenance -Vaccine gaps: Shingrix -Hx prostate cancer (2019 s/p XRT)  Patient Goals/Self-Care Activities Patient will:  - take medications as prescribed as evidenced by patient report and record review check glucose daily, document, and provide at future appointments check blood pressure daily, document, and provide at future appointments collaborate with  provider on medication access solutions      Medication Assistance:  Judithann Sauger - AZ&Me approved 2023.  Compliance/Adherence/Medication fill history: Care Gaps: Eye exam (due 12/29/20) Foot exam (due 01/19/21)  Star-Rating Drugs: Metformin - PDC 43% (LF 12/11/21 x 90 ds) - inaccurate, per patient he has refilled Pravastatin - PDC 97%   Medication Access: Within the past  30 days, how often has patient missed a dose of medication? 0 Is a pillbox or other method used to improve adherence? Yes  Factors that may affect medication adherence? financial need Are meds synced by current pharmacy? No  Are meds delivered by current pharmacy? No  Does patient experience delays in picking up medications due to transportation concerns? No   [facility] Services Reviewed: Is patient disadvantaged to use [facility]?: No  Current Rx insurance plan: [place] Name and location of Current pharmacy:  [place], [place] [address] [place] [address] Phone: [phone] Fax: [place], [address] [place] [address] Phone: [phone] Fax: [phone]  [facility] services reviewed with patient today?: No  Patient requests to transfer care to [facility]?: No  Reason patient declined to change pharmacies: Disadvantaged due to insurance/mail order   Care Plan and Follow Up Patient Decision:  Patient agrees to Care Plan and Follow-up.  Plan: Telephone follow up appointment with care management team member scheduled for:  6 months  Charlene Brooke, PharmD, BCACP Clinical Pharmacist [facility] [phone]

## 2022-07-07 ENCOUNTER — Telehealth: Payer: Self-pay | Admitting: Pharmacist

## 2022-07-07 DIAGNOSIS — E782 Mixed hyperlipidemia: Secondary | ICD-10-CM | POA: Diagnosis not present

## 2022-07-07 DIAGNOSIS — E1140 Type 2 diabetes mellitus with diabetic neuropathy, unspecified: Secondary | ICD-10-CM | POA: Diagnosis not present

## 2022-07-07 DIAGNOSIS — I4891 Unspecified atrial fibrillation: Secondary | ICD-10-CM

## 2022-07-07 DIAGNOSIS — I5032 Chronic diastolic (congestive) heart failure: Secondary | ICD-10-CM | POA: Diagnosis not present

## 2022-07-07 NOTE — Patient Instructions (Signed)
Visit Information  Phone number for Pharmacist: [phone]   Goals Addressed   None     Care Plan : Grace PLan  Updates made by Charlton Haws, RPH since 07/07/2022 12:00 AM     Problem: Hypertension, Hyperlipidemia, Atrial Fibrillation, Heart Failure, and Coronary Artery Disease   Priority: High  Onset Date: 07/05/2021     Long-Range Goal: Disease mgmt   Start Date: 07/07/2022  Expected End Date: 07/08/2023  This Visit's Progress: On track  Priority: High  Note:   Current Barriers:  Unable to independently afford treatment regimen  Pharmacist Clinical Goal(s):  Patient will verbalize ability to afford treatment regimen through collaboration with PharmD and provider.   Interventions: 1:1 collaboration with Ria Bush, MD regarding development and update of comprehensive plan of care as evidenced by provider attestation and co-signature Inter-disciplinary care team collaboration (see longitudinal plan of care) Comprehensive medication review performed; medication list updated in electronic medical record  Hypertension / Heart Failure (BP goal <140/90) -Controlled - per home readings; he reports he has not needed midodrine lately -Current home readings: 119/67 - 125/80, HR 60-70 -Denies hypotensive/hypertensive symptoms -Last ejection fraction: >55% (Date: 11/2021) -HF type: Diastolic; NYHA Class: not on file -Considerations: hx orthostatic hypotension (sometimes on midodrine); Tobacco abuse, not interested in quitting -Current treatment: Amlodipine 5 mg PRN (only takes for SBP > 160) - Appropriate, Effective, Safe, Accessible Furosemide 20 mg daily PRN -Appropriate, Effective, Safe, Accessible Metoprolol tartrate 25 mg BID -Appropriate, Effective, Safe, Accessible Spironolactone 25 mg - 1/2 tab daily -Appropriate, Effective, Safe, Accessible Diltiazem 30 mg PRN -Appropriate, Effective, Safe, Accessible Midodrine 10 mg TID PRN -not needed  currently -Medications previously tried: none reported -Educated on BP goals and benefits of medications for prevention of heart attack, stroke and kidney damage;  -Counseled to monitor BP at home daily -Recommended to continue current medication  Atrial Fibrillation (Goal: prevent stroke and major bleeding) -Controlled -CHADSVASC: 5; hx sick sinus syndrome -Current treatment: Metoprolol tartrate 25 mg BID - Appropriate, Effective, Safe, Accessible Diltiazem 30 mg PRN (HR > ?) -Appropriate, Effective, Safe, Accessible Amiodarone 200 mg PRN (HR > 100) - Query Appropriate Warfarin 2.5 mg as directed - Appropriate, Effective, Safe, Accessible -Medications previously tried: n/a -Reviewed utility of PRN amiodarone - with half life of amiodarone ~40 days, using it PRN is not typically done; however pt appears controlled this way and cardiologist is aware he is taking it this way, reasonable to continue as is -Recommended to continue current medication  Hyperlipidemia: (LDL goal < 70) -Controlled - LDL 69 (09/2021) at goal -Current treatment: Pravastatin 20 mg daily -Appropriate, Effective, Safe, Accessible Aspirin 81 mg daily -Appropriate, Effective, Safe, Accessible Coenezyme Q10 -Appropriate, Effective, Safe, Accessible Omega 3 Fish oil -Appropriate, Effective, Safe, Accessible Nitroglyercin 0.4 mg SL prn -Appropriate, Effective, Safe, Accessible -Medications previously tried: n/a  -Educated on Cholesterol goals;  -Recommended to continue current medication  Diabetes (A1c goal <7%) -Controlled - A1c 6.1% (12/2021) -Hx peripheral neuropathy, diabetic foot ulcer (L toe) -Current home glucose readings - checks a couple times a week fasting glucose: 150  post prandial glucose: 150 (2 hours after meal) -Denies hypoglycemic/hyperglycemic symptoms -Current medications: Metformin 500 mg daily AM -Medications previously tried: none  -Educated on A1c and blood sugar goals; -Counseled to  check feet daily and get yearly eye exams -Recommended to continue current medication; Schedule annual diabetic foot exam.  COPD, mixed type (Goal: control symptoms and prevent exacerbations) -Not ideally controlled - pt reports  Judithann Sauger is expensive so he rations it somewhat, takes once daily or QOD -Tobacco use - 2.5 PPD -Follows with pulmonary - Dr Merrie Roof (last OV 08/20/21) -Current treatment  Breztri 160-9-4.8 mcg/act 2 puff BID - Appropriate, Effective, Safe, Query Accessible Albuterol HFA - Appropriate, Effective, Safe, Accessible Duoneb PRN - Appropriate, Effective, Safe, Accessible -Medications previously tried: n/a  -Pulmonary function testing: not on file -Exacerbations requiring treatment in last 6 months: 0 -Patient denies consistent use of maintenance inhaler -Frequency of rescue inhaler use: PRN -Counseled on Proper inhaler technique;Benefits of consistent maintenance inhaler use -Assessed patient finances. He qualifies for AZ&me - enrolled in PAP and consulted with pulmonology for [facility] Rx refill -Recommend to continue current medication  Health Maintenance -Vaccine gaps: Shingrix -Hx prostate cancer (2019 s/p XRT)  Patient Goals/Self-Care Activities Patient will:  - take medications as prescribed as evidenced by patient report and record review check glucose daily, document, and provide at future appointments check blood pressure daily, document, and provide at future appointments collaborate with provider on medication access solutions      Patient verbalizes understanding of instructions and care plan provided today and agrees to view in [place]. Active MyChart status and patient understanding of how to access instructions and care plan via MyChart confirmed with patient.    Telephone follow up appointment with pharmacy team member scheduled for: 6 months  Charlene Brooke, PharmD, [facility] Clinical Pharmacist [facility] [phone]

## 2022-07-07 NOTE — Telephone Encounter (Signed)
Patient could not afford Breztri and was rationing it. He has now been enrolled in AZ&Me and can get Breztri shipped to him for free. AZ&Me needs new Rx sent to [facility] pharmacy to complete enrollment.  Routing to pulmonology for RX:  [place], [address] [place] [address] Phone: [phone] Fax: [phone]

## 2022-07-07 NOTE — Telephone Encounter (Signed)
Patient has not been seen by pulmonology since September 2022. Looks like everything is managed by PCP. Most recent refill requests have also been denied by pulmonology.  Knox Saliva, PharmD, MPH, BCPS, CPP Clinical Pharmacist (Rheumatology and Pulmonology)

## 2022-07-08 MED ORDER — BREZTRI AEROSPHERE 160-9-4.8 MCG/ACT IN AERO: 2.0000 | INHALATION_SPRAY | Freq: Two times a day (BID) | RESPIRATORY_TRACT | 3 refills | Status: DC

## 2022-07-08 MED ORDER — BREZTRI AEROSPHERE 160-9-4.8 MCG/ACT IN AERO
2.0000 | INHALATION_SPRAY | Freq: Two times a day (BID) | RESPIRATORY_TRACT | 11 refills | Status: DC
Start: 2022-07-08 — End: 2022-07-08

## 2022-07-08 NOTE — Addendum Note (Signed)
Addended by: Ria Bush on: 07/08/2022 02:14 PM   Modules accepted: Orders

## 2022-07-08 NOTE — Telephone Encounter (Addendum)
I've sent breztri Rx in. .  Can you verify how much I'm sending is correct? Thanks

## 2022-07-08 NOTE — Addendum Note (Signed)
Addended by: Charlton Haws on: 07/08/2022 04:35 PM   Modules accepted: Orders

## 2022-07-23 ENCOUNTER — Ambulatory Visit (INDEPENDENT_AMBULATORY_CARE_PROVIDER_SITE_OTHER): Payer: Medicare Other

## 2022-07-23 DIAGNOSIS — I4891 Unspecified atrial fibrillation: Secondary | ICD-10-CM

## 2022-07-23 DIAGNOSIS — Z5181 Encounter for therapeutic drug level monitoring: Secondary | ICD-10-CM | POA: Diagnosis not present

## 2022-07-23 LAB — POCT INR: INR: 4.3 — AB (ref 2.0–3.0)

## 2022-07-23 NOTE — Patient Instructions (Signed)
-  HOLD TODAY ONLY - Continue 1.5 tablets every day - Recheck INR in 2 weeks

## 2022-07-28 IMAGING — DX DG HIP (WITH OR WITHOUT PELVIS) 2-3V*L*
3 series · 3 of 3 positions shown · non-contrast
Comparison: None.

CLINICAL DATA: Fall with left hip pain.

EXAM:
DG HIP (WITH OR WITHOUT PELVIS) 2-3V LEFT

[pelvis ap]
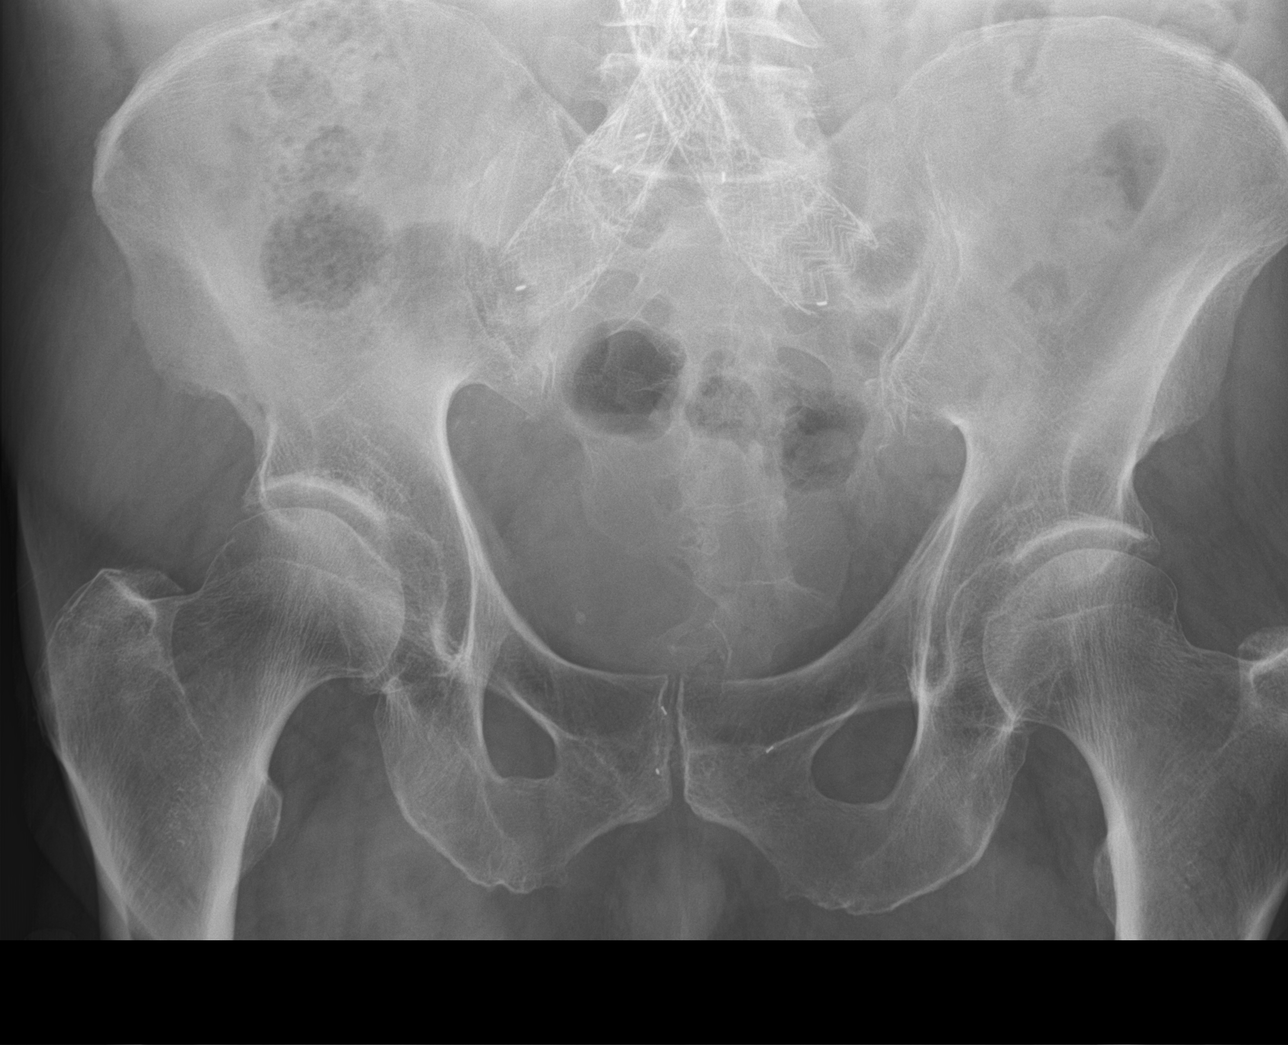

[hip ap]
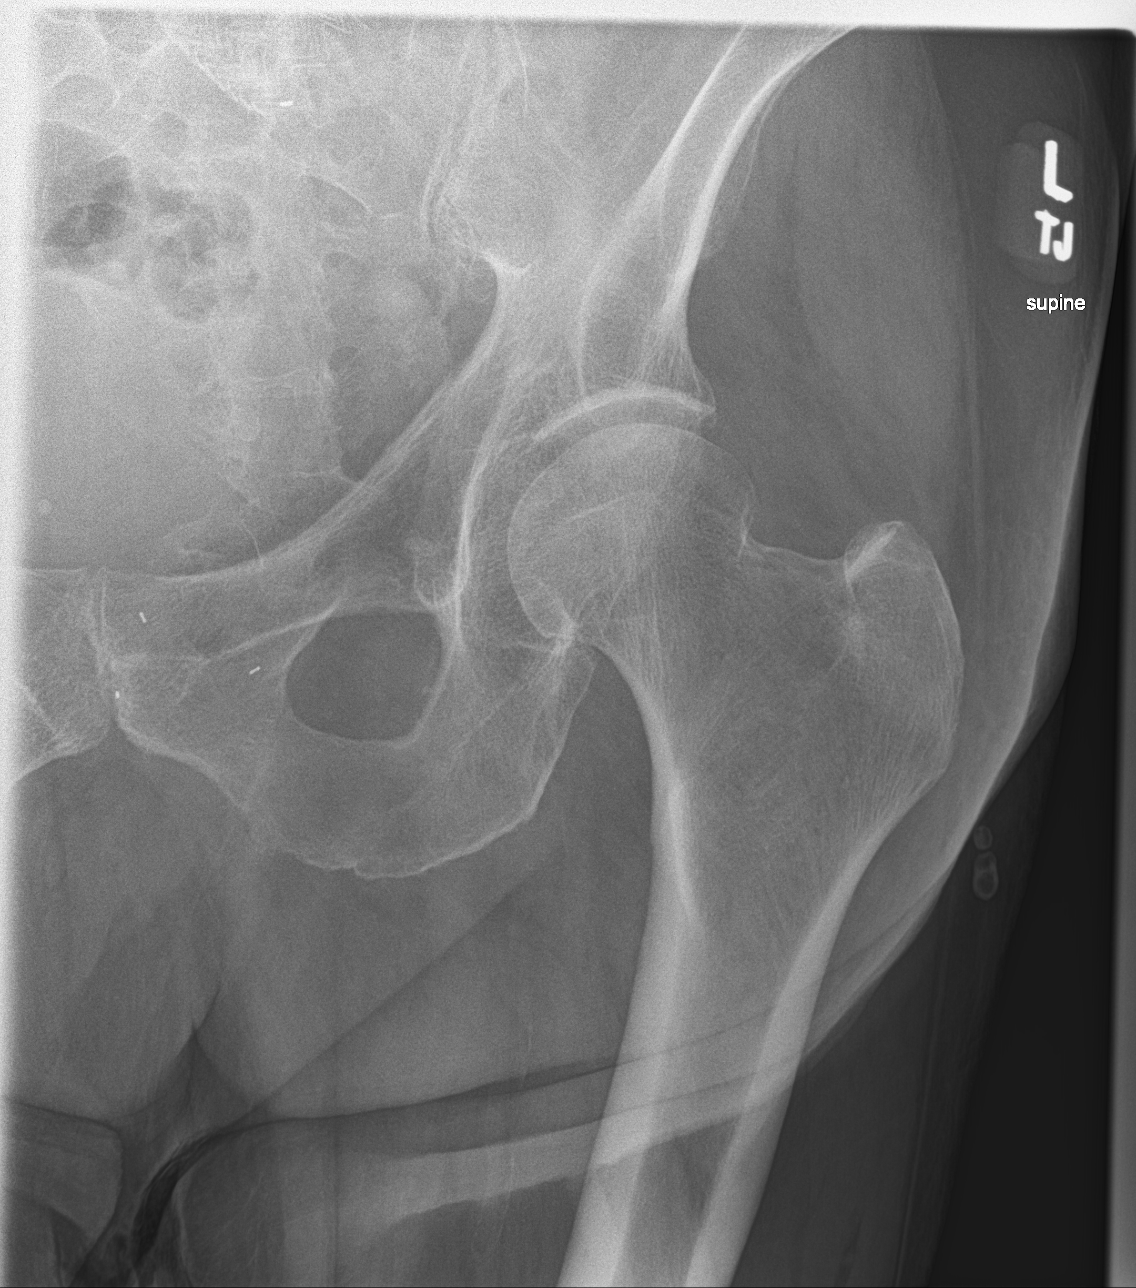

[hip lat]
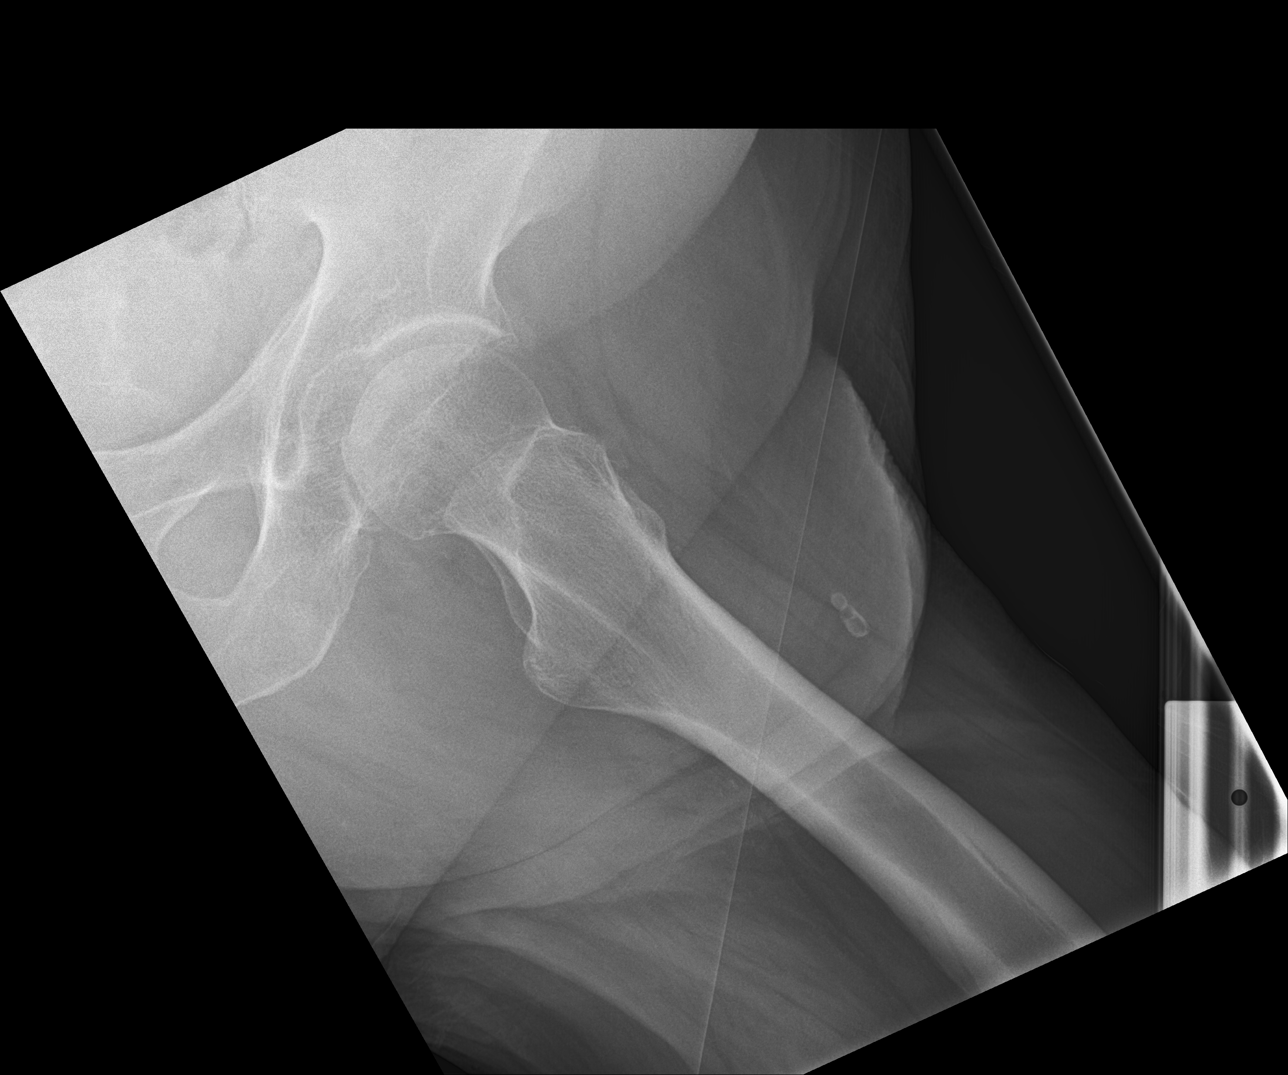

[3 of 3 positions shown; findings below may reference images not displayed]

FINDINGS: No evidence of pelvic fracture. No left hip fracture visible.
Aortoiliac stents as seen previously.
IMPRESSION: Negative.

## 2022-08-04 ENCOUNTER — Other Ambulatory Visit: Payer: Self-pay | Admitting: Family Medicine

## 2022-08-04 DIAGNOSIS — G894 Chronic pain syndrome: Secondary | ICD-10-CM

## 2022-08-04 MED ORDER — HYDROCODONE-ACETAMINOPHEN 10-325 MG PO TABS: 1.0000 | ORAL_TABLET | Freq: Four times a day (QID) | ORAL | 0 refills | Status: DC | PRN

## 2022-08-04 NOTE — Telephone Encounter (Signed)
Name of Medication: Hydrocodone Name of Pharmacy: [facility] or Written Date and Quantity: 06/27/22 #120 Last Office Visit and Type: 04/01/22 Next Office Visit and Type: 09/15/22 Last Controlled Substance Agreement Date: 07/06/17 Last UDS:02/03/18

## 2022-08-04 NOTE — Telephone Encounter (Signed)
ERx 
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
## 2022-08-06 ENCOUNTER — Encounter: Payer: Self-pay | Admitting: Cardiovascular Disease

## 2022-08-06 ENCOUNTER — Ambulatory Visit: Payer: Medicare Other | Attending: Cardiovascular Disease | Admitting: Cardiovascular Disease

## 2022-08-06 ENCOUNTER — Ambulatory Visit (INDEPENDENT_AMBULATORY_CARE_PROVIDER_SITE_OTHER): Payer: Medicare Other

## 2022-08-06 VITALS — BP 100/70 | HR 58 | Ht 72.0 in | Wt 204.4 lb

## 2022-08-06 DIAGNOSIS — E1140 Type 2 diabetes mellitus with diabetic neuropathy, unspecified: Secondary | ICD-10-CM | POA: Insufficient documentation

## 2022-08-06 DIAGNOSIS — I10 Essential (primary) hypertension: Secondary | ICD-10-CM | POA: Insufficient documentation

## 2022-08-06 DIAGNOSIS — Z5181 Encounter for therapeutic drug level monitoring: Secondary | ICD-10-CM | POA: Diagnosis not present

## 2022-08-06 DIAGNOSIS — I77810 Thoracic aortic ectasia: Secondary | ICD-10-CM | POA: Diagnosis not present

## 2022-08-06 DIAGNOSIS — I25118 Atherosclerotic heart disease of native coronary artery with other forms of angina pectoris: Secondary | ICD-10-CM | POA: Diagnosis not present

## 2022-08-06 DIAGNOSIS — I4891 Unspecified atrial fibrillation: Secondary | ICD-10-CM | POA: Diagnosis not present

## 2022-08-06 DIAGNOSIS — I2721 Secondary pulmonary arterial hypertension: Secondary | ICD-10-CM | POA: Diagnosis not present

## 2022-08-06 DIAGNOSIS — I714 Abdominal aortic aneurysm, without rupture, unspecified: Secondary | ICD-10-CM | POA: Diagnosis not present

## 2022-08-06 DIAGNOSIS — R001 Bradycardia, unspecified: Secondary | ICD-10-CM | POA: Diagnosis not present

## 2022-08-06 DIAGNOSIS — I495 Sick sinus syndrome: Secondary | ICD-10-CM | POA: Diagnosis not present

## 2022-08-06 DIAGNOSIS — I480 Paroxysmal atrial fibrillation: Secondary | ICD-10-CM | POA: Diagnosis not present

## 2022-08-06 LAB — POCT INR: INR: 2.3 (ref 2.0–3.0)

## 2022-08-06 MED ORDER — METOPROLOL TARTRATE 25 MG PO TABS: 25.0000 mg | ORAL_TABLET | Freq: Two times a day (BID) | ORAL | 2 refills | Status: DC

## 2022-08-06 MED ORDER — DILTIAZEM HCL 30 MG PO TABS: 30.0000 mg | ORAL_TABLET | Freq: Every day | ORAL | 5 refills | Status: DC | PRN

## 2022-08-06 NOTE — Patient Instructions (Signed)
-   Continue 1.5 tablets every day - Recheck INR in 4 weeks

## 2022-08-06 NOTE — Patient Instructions (Addendum)
Medication Instructions:  Ok to hold the amiodarone, take as needed for fast heart rate Take metoprolol tartrate 25 mg twice a day  For fast heart rate symptoms or elevated blood pressure, Take diltiazem 30 mg pill up to three times a day as needed This will lower heart rate and blood pressure   If you need a refill on your cardiac medications before your next appointment, please call your pharmacy.   Lab work: No new labs needed  Testing/Procedures: No new testing needed  Follow-Up: At [facility], you and your health needs are our priority.  As part of our continuing mission to provide you with exceptional heart care, we have created designated Provider Care Teams.  These Care Teams include your primary Cardiologist (physician) and Advanced Practice Providers (APPs -  Physician Assistants and Nurse Practitioners) who all work together to provide you with the care you need, when you need it.  You will need a follow up appointment in 6 months, APP ok  Providers on your designated Care Team:   Murray Hodgkins, NP Christell Faith, PA-C Cadence Kathlen Mody, [place]  COVID-19 Vaccine Information can be found at: [email or web address] For questions related to vaccine distribution or appointments, please email [email or web address] or call [phone].

## 2022-08-07 ENCOUNTER — Other Ambulatory Visit: Payer: Self-pay | Admitting: Family Medicine

## 2022-08-07 NOTE — Telephone Encounter (Signed)
Pt's wife called requesting for the medication gabapentin (NEURONTIN) 600 MG tablet frequency to be changed from 1/2 of tablet to 1 tablet at bedtime to twice daily (1 during the day, 1 at night). Callback # [phone] (wife's #)

## 2022-08-07 NOTE — Telephone Encounter (Signed)
Last OV:  04/01/22, jerking Next OV:  09/15/22, CPE

## 2022-08-11 MED ORDER — GABAPENTIN 600 MG PO TABS
600.0000 mg | ORAL_TABLET | Freq: Two times a day (BID) | ORAL | 1 refills | Status: DC
Start: 2022-08-11 — End: 2023-02-05

## 2022-08-11 NOTE — Telephone Encounter (Signed)
Latest GFR 77. Will refill.

## 2022-09-03 ENCOUNTER — Ambulatory Visit: Payer: Medicare Other

## 2022-09-08 ENCOUNTER — Other Ambulatory Visit: Payer: Self-pay | Admitting: Family Medicine

## 2022-09-08 DIAGNOSIS — G894 Chronic pain syndrome: Secondary | ICD-10-CM

## 2022-09-08 NOTE — Telephone Encounter (Addendum)
Message from pt via pharmacy:  Dr. Darnell Level. I am requesting refill for hydrocodone and is near empty, 4 pills. Thank you.  Name of Medication: Hydrocodone-APAP Name of Pharmacy: [facility] [address] Last Fill or Written Date and Quantity: 09/03/22, #120 Last Office Visit and Type: 04/01/22, muscle spasms Next Office Visit and Type: 09/15/22, CPE Last Controlled Substance Agreement Date: 07/06/17 Last UDS: 02/03/18

## 2022-09-09 MED ORDER — HYDROCODONE-ACETAMINOPHEN 10-325 MG PO TABS: 1.0000 | ORAL_TABLET | Freq: Four times a day (QID) | ORAL | 0 refills | Status: DC | PRN

## 2022-09-09 NOTE — Telephone Encounter (Signed)
ERx 

## 2022-09-09 NOTE — Telephone Encounter (Signed)
Pt called asking for update on med refill for HYDROcodone-acetaminophen (NORCO) 10-325 MG tablet. I told pt it does take 2-3 business days for refill meds. Call back # is [phone]

## 2022-09-10 ENCOUNTER — Ambulatory Visit: Payer: Medicare Other | Attending: Cardiovascular Disease

## 2022-09-10 DIAGNOSIS — Z5181 Encounter for therapeutic drug level monitoring: Secondary | ICD-10-CM | POA: Diagnosis not present

## 2022-09-10 DIAGNOSIS — I4891 Unspecified atrial fibrillation: Secondary | ICD-10-CM | POA: Diagnosis not present

## 2022-09-10 LAB — POCT INR: INR: 2.8 (ref 2.0–3.0)

## 2022-09-10 NOTE — Telephone Encounter (Signed)
Error

## 2022-09-10 NOTE — Patient Instructions (Signed)
-   Continue 1.5 tablets every day - Recheck INR in 6 weeks   

## 2022-09-12 ENCOUNTER — Other Ambulatory Visit: Payer: Self-pay | Admitting: Family Medicine

## 2022-09-12 DIAGNOSIS — G894 Chronic pain syndrome: Secondary | ICD-10-CM

## 2022-09-12 NOTE — Telephone Encounter (Signed)
Patient comment: Dr Darnell Level. You approved Thadius's request for the hydrocodone; however [facility] canceled the prescription renewal because they don't have it in stock. I checked other pharmacies and [facility] does have it. He is out of the medication. Address is [place]. The pharmacy manager is Prudencio Pair. Also, I am having difficulty updating pharmacy locations at the moment. Thank you for your help.

## 2022-09-13 MED ORDER — HYDROCODONE-ACETAMINOPHEN 10-325 MG PO TABS: 1.0000 | ORAL_TABLET | Freq: Four times a day (QID) | ORAL | 0 refills | Status: DC | PRN

## 2022-09-13 NOTE — Telephone Encounter (Signed)
[facility]x to [facility] plz notify wife

## 2022-09-15 ENCOUNTER — Ambulatory Visit (INDEPENDENT_AMBULATORY_CARE_PROVIDER_SITE_OTHER): Payer: Medicare Other | Admitting: Family Medicine

## 2022-09-15 ENCOUNTER — Encounter: Payer: Self-pay | Admitting: Family Medicine

## 2022-09-15 ENCOUNTER — Ambulatory Visit (INDEPENDENT_AMBULATORY_CARE_PROVIDER_SITE_OTHER)
Admission: RE | Admit: 2022-09-15 | Discharge: 2022-09-15 | Disposition: A | Discharge status: Home / Self Care | Payer: Medicare Other | Source: Ambulatory Visit | Attending: Family Medicine | Admitting: Family Medicine

## 2022-09-15 VITALS — BP 110/78 | HR 62 | Temp 97.2°F | Ht 72.0 in | Wt 206.0 lb

## 2022-09-15 DIAGNOSIS — C61 Malignant neoplasm of prostate: Secondary | ICD-10-CM

## 2022-09-15 DIAGNOSIS — E1142 Type 2 diabetes mellitus with diabetic polyneuropathy: Secondary | ICD-10-CM

## 2022-09-15 DIAGNOSIS — I10 Essential (primary) hypertension: Secondary | ICD-10-CM

## 2022-09-15 DIAGNOSIS — J449 Chronic obstructive pulmonary disease, unspecified: Secondary | ICD-10-CM

## 2022-09-15 DIAGNOSIS — M79631 Pain in right forearm: Secondary | ICD-10-CM

## 2022-09-15 DIAGNOSIS — I480 Paroxysmal atrial fibrillation: Secondary | ICD-10-CM

## 2022-09-15 DIAGNOSIS — D509 Iron deficiency anemia, unspecified: Secondary | ICD-10-CM | POA: Diagnosis not present

## 2022-09-15 DIAGNOSIS — Z0000 Encounter for general adult medical examination without abnormal findings: Secondary | ICD-10-CM

## 2022-09-15 DIAGNOSIS — I700 Atherosclerosis of aorta: Secondary | ICD-10-CM

## 2022-09-15 DIAGNOSIS — I714 Abdominal aortic aneurysm, without rupture, unspecified: Secondary | ICD-10-CM | POA: Diagnosis not present

## 2022-09-15 DIAGNOSIS — I5032 Chronic diastolic (congestive) heart failure: Secondary | ICD-10-CM

## 2022-09-15 DIAGNOSIS — E559 Vitamin D deficiency, unspecified: Secondary | ICD-10-CM | POA: Diagnosis not present

## 2022-09-15 DIAGNOSIS — G8929 Other chronic pain: Secondary | ICD-10-CM | POA: Insufficient documentation

## 2022-09-15 DIAGNOSIS — M159 Polyosteoarthritis, unspecified: Secondary | ICD-10-CM

## 2022-09-15 DIAGNOSIS — E782 Mixed hyperlipidemia: Secondary | ICD-10-CM | POA: Diagnosis not present

## 2022-09-15 DIAGNOSIS — I25118 Atherosclerotic heart disease of native coronary artery with other forms of angina pectoris: Secondary | ICD-10-CM | POA: Diagnosis not present

## 2022-09-15 DIAGNOSIS — Z720 Tobacco use: Secondary | ICD-10-CM

## 2022-09-15 DIAGNOSIS — E1140 Type 2 diabetes mellitus with diabetic neuropathy, unspecified: Secondary | ICD-10-CM

## 2022-09-15 DIAGNOSIS — Z7189 Other specified counseling: Secondary | ICD-10-CM | POA: Diagnosis not present

## 2022-09-15 DIAGNOSIS — M79672 Pain in left foot: Secondary | ICD-10-CM

## 2022-09-15 DIAGNOSIS — G894 Chronic pain syndrome: Secondary | ICD-10-CM

## 2022-09-15 DIAGNOSIS — E538 Deficiency of other specified B group vitamins: Secondary | ICD-10-CM | POA: Diagnosis not present

## 2022-09-15 DIAGNOSIS — F1120 Opioid dependence, uncomplicated: Secondary | ICD-10-CM

## 2022-09-15 MED ORDER — METHOCARBAMOL 500 MG PO TABS: 500.0000 mg | ORAL_TABLET | Freq: Two times a day (BID) | ORAL | 0 refills | Status: DC | PRN

## 2022-09-15 NOTE — Patient Instructions (Addendum)
Labs today  Call Dr Eddie Dibbles office about follow up plan.  Schedule eye exam.  For left foot pain - will order ABIs to be done at local heart doctor office For right arm - will check muscle function test in blood work today.  Return in 4 months for follow up visit.   Health Maintenance After Age 78 After age 27, you are at a higher risk for certain long-term diseases and infections as well as injuries from falls. Falls are a major cause of broken bones and head injuries in people who are older than age 64. Getting regular preventive care can help to keep you healthy and well. Preventive care includes getting regular testing and making lifestyle changes as recommended by your health care provider. Talk with your health care provider about: Which screenings and tests you should have. A screening is a test that checks for a disease when you have no symptoms. A diet and exercise plan that is right for you. What should I know about screenings and tests to prevent falls? Screening and testing are the best ways to find a health problem early. Early diagnosis and treatment give you the best chance of managing medical conditions that are common after age 10. Certain conditions and lifestyle choices may make you more likely to have a fall. Your health care provider may recommend: Regular vision checks. Poor vision and conditions such as cataracts can make you more likely to have a fall. If you wear glasses, make sure to get your prescription updated if your vision changes. Medicine review. Work with your health care provider to regularly review all of the medicines you are taking, including over-the-counter medicines. Ask your health care provider about any side effects that may make you more likely to have a fall. Tell your health care provider if any medicines that you take make you feel dizzy or sleepy. Strength and balance checks. Your health care provider may recommend certain tests to check your strength  and balance while standing, walking, or changing positions. Foot health exam. Foot pain and numbness, as well as not wearing proper footwear, can make you more likely to have a fall. Screenings, including: Osteoporosis screening. Osteoporosis is a condition that causes the bones to get weaker and break more easily. Blood pressure screening. Blood pressure changes and medicines to control blood pressure can make you feel dizzy. Depression screening. You may be more likely to have a fall if you have a fear of falling, feel depressed, or feel unable to do activities that you used to do. Alcohol use screening. Using too much alcohol can affect your balance and may make you more likely to have a fall. Follow these instructions at home: Lifestyle Do not drink alcohol if: Your health care provider tells you not to drink. If you drink alcohol: Limit how much you have to: 0-1 drink a day for women. 0-2 drinks a day for men. Know how much alcohol is in your drink. In the [place] one drink equals one 12 oz bottle of beer (355 mL), one 5 oz glass of wine (148 mL), or one 1 oz glass of hard liquor (44 mL). Do not use any products that contain nicotine or tobacco. These products include cigarettes, chewing tobacco, and vaping devices, such as e-cigarettes. If you need help quitting, ask your health care provider. Activity  Follow a regular exercise program to stay fit. This will help you maintain your balance. Ask your health care provider what types of exercise are  appropriate for you. If you need a cane or walker, use it as recommended by your health care provider. Wear supportive shoes that have nonskid soles. Safety  Remove any tripping hazards, such as rugs, cords, and clutter. Install safety equipment such as grab bars in bathrooms and safety rails on stairs. Keep rooms and walkways well-lit. General instructions Talk with your health care provider about your risks for falling. Tell your health  care provider if: You fall. Be sure to tell your health care provider about all falls, even ones that seem minor. You feel dizzy, tiredness (fatigue), or off-balance. Take over-the-counter and prescription medicines only as told by your health care provider. These include supplements. Eat a healthy diet and maintain a healthy weight. A healthy diet includes low-fat dairy products, low-fat (lean) meats, and fiber from whole grains, beans, and lots of fruits and vegetables. Stay current with your vaccines. Schedule regular health, dental, and eye exams. Summary Having a healthy lifestyle and getting preventive care can help to protect your health and wellness after age 73. Screening and testing are the best way to find a health problem early and help you avoid having a fall. Early diagnosis and treatment give you the best chance for managing medical conditions that are more common for people who are older than age 34. Falls are a major cause of broken bones and head injuries in people who are older than age 47. Take precautions to prevent a fall at home. Work with your health care provider to learn what changes you can make to improve your health and wellness and to prevent falls. This information is not intended to replace advice given to you by your health care provider. Make sure you discuss any questions you have with your health care provider. Document Revised: 04/15/2021 Document Reviewed: 04/15/2021 Elsevier Patient Education  [place].

## 2022-09-15 NOTE — Progress Notes (Unsigned)
Vision Screening   Right eye Left eye Both eyes  Without correction 20/50 20/200 20/50  With correction     Hearing Screening - Comments:: Did not pass whisper test. Aware of some hearing loss

## 2022-09-15 NOTE — Assessment & Plan Note (Signed)

## 2022-09-15 NOTE — Progress Notes (Unsigned)
Patient ID: [name], male    DOB: [birth year], 76 y.o.   MRN: [id number]  This visit was conducted in person.  BP 110/78 (BP Location: Left Arm, Patient Position: Sitting, Cuff Size: Large)   Pulse 62   Temp (!) 97.2 F (36.2 C)   Ht 6' (1.829 m)   Wt 206 lb (93.4 kg)   SpO2 98%   BMI 27.94 kg/m    CC: AMW Subjective:   HPI: [name] is a 76 y.o. male presenting on 09/15/2022 for Medicare Wellness (Here with wife, [name]. Left foot pain. Says it is always cold. Wearing Salonpas on it to try and help the pain. Right arm pain. )   Did not see health advisor this year.   Vision Screening   Right eye Left eye Both eyes  Without correction 20/50 20/200 20/50  With correction     Hearing Screening - Comments:: Did not pass whisper test. Aware of some hearing loss  [place] Visit from 09/15/2022 in [place] at [facility] Total Score 0     Known cataracts, has not recently seen.     09/15/2022    2:43 PM 09/10/2021   12:13 PM 05/02/2020    4:15 PM 04/18/2019   12:50 PM 01/06/2018    4:15 PM  Fall Risk   Falls in the past year? 0 1 1 0 Yes  Number falls in past yr: 0 1 1    Injury with Fall? 0 0 0  No  Risk for fall due to : History of fall(s)      Follow up Falls evaluation completed       Failed vision screen Failed hearing screen  Chronic L foot pain, feet stay warm, he's using salon pas patch nightly. Says this has been going on for years. No numbness or paresthesias or burning. Describes aching throbbing pain.   Also having R forearm pain ongoing for years. Manages with sleeve, arthritis heat cream  Preventative: Colon cancer screening - h/o IDA that resolved with replacement. Last saw [facility]) 12/2017 - hesitant at that time for luminal evaluation given comorbidities. Never returned iFOB. He had reassuring abd/pelvic CT. From GI's last note: "If he were to consent to a procedure or sedation, further cardiac clearance would be  required due his bradycardia". Denies blood in stool.  Prostate cancer treatment - completed radiation therapy ([facility]). States released from rad onc Q6 mo.  Lung cancer screening - PAH on chest CT 01/2018. Interested in ongoing screening, unsure if eligible - will refer.  Flu shot - declines COVID vaccine - Pfizer 02/2020, 03/2020 Prevnar-20 09/2021 Td 04/2020  Shingrix - declines  Advanced directive discussion - working on this at home. Wife would be HCPOA.  Seat belt use discussed Sunscreen use discussed, no changing moles.  Smoker - 1 ppd longterm smoker (53 yrs) - chantix didn't help (vivid dreams).  Alcohol - rare  Dentist - has upper dentures, lost lower dentures - no luck getting fitted again despite multiple attempts  Eye exam overdue - last seen 2021  Bowel - no diarrhea/constipation  Bladder - no incontinence    Caffeine: 1 cup coffee, 1/2 gallon unsweet tea, 2 soda/day Lives with wife and 1 dog, 6 cats outside. Occ: Long distance truck driver Started seeking medical care when he received medicare. H/o noncompliance     Relevant past medical, surgical, family and social history reviewed and updated as indicated. Interim medical history  since our last visit reviewed. Allergies and medications reviewed and updated. Outpatient Medications Prior to Visit  Medication Sig Dispense Refill   albuterol (VENTOLIN HFA) 108 (90 Base) MCG/ACT inhaler Inhale 2 puffs into the lungs every 6 (six) hours as needed for wheezing or shortness of breath. 8.5 g 3   amiodarone (PACERONE) 200 MG tablet Take 200 mg by mouth as needed. Take for fast heart rate>100     amLODipine (NORVASC) 5 MG tablet Take 5 mg by mouth as needed. For SBP > 160     aspirin 81 MG EC tablet Take 1 tablet (81 mg total) by mouth daily. Swallow whole.     Budeson-Glycopyrrol-Formoterol (BREZTRI AEROSPHERE) 160-9-4.8 MCG/ACT AERO Inhale 2 puffs into the lungs in the morning and at bedtime. 3 each 3   Cholecalciferol  (VITAMIN D) 50 MCG (2000 UT) CAPS Take 1 capsule (2,000 Units total) by mouth daily. 30 capsule    Coenzyme Q10 (COQ-10 PO) Take 1 tablet by mouth daily.     Cyanocobalamin (B-12) 1000 MCG SUBL Place 1 tablet under the tongue daily.     diclofenac Sodium (VOLTAREN) 1 % GEL Apply 4 g topically 3 (three) times daily. (Patient taking differently: Apply 4 g topically 3 (three) times daily. As needed) 100 g 3   diltiazem (CARDIZEM) 30 MG tablet Take 1 tablet (30 mg total) by mouth daily as needed (fast heart rate). 30 tablet 5   diphenoxylate-atropine (LOMOTIL) 2.5-0.025 MG tablet Take 1 tablet by mouth 2 (two) times daily as needed. 20 tablet 0   Ferrous Sulfate (IRON) 325 (65 Fe) MG TABS Take by mouth daily.     fish oil-omega-3 fatty acids 1000 MG capsule Take 2 g by mouth daily.     furosemide (LASIX) 20 MG tablet TAKE 1 TABLET BY MOUTH EVERY DAY AS NEEDED FOR FLUID RETENTION 90 tablet 0   gabapentin (NEURONTIN) 600 MG tablet Take 1 tablet (600 mg total) by mouth 2 (two) times daily. 180 tablet 1   HYDROcodone-acetaminophen (NORCO) 10-325 MG tablet Take 1 tablet by mouth every 6 (six) hours as needed for moderate pain or severe pain. 120 tablet 0   ipratropium-albuterol (DUONEB) 0.5-2.5 (3) MG/3ML SOLN Take 3 mLs by nebulization every 6 (six) hours as needed. 360 mL 11   metFORMIN (GLUCOPHAGE-XR) 500 MG 24 hr tablet Take 1 tablet (500 mg total) by mouth daily with breakfast. 90 tablet 1   metoprolol tartrate (LOPRESSOR) 25 MG tablet Take 1 tablet (25 mg total) by mouth 2 (two) times daily. Take for fast heart rate>100 180 tablet 2   midodrine (PROAMATINE) 10 MG tablet Take 1 tablet (10 mg total) by mouth 3 (three) times daily as needed. 90 tablet 1   nitroGLYCERIN (NITROLINGUAL) 0.4 MG/SPRAY spray USE 1 SPRAY AS DIRECTED EVERY 5 MINUTES AS NEEDED 4.9 g 1   omeprazole (PRILOSEC) 40 MG capsule Take 1 capsule (40 mg total) by mouth daily. 90 capsule 3   pravastatin (PRAVACHOL) 20 MG tablet TAKE 1  TABLET(20 MG) BY MOUTH DAILY 90 tablet 3   spironolactone (ALDACTONE) 25 MG tablet Take 0.5 tablets (12.5 mg total) by mouth daily.     warfarin (COUMADIN) 2.5 MG tablet TAKE BY MOUTH AS DIRECTED AFTER ANTI-COAG CLINIC 135 tablet 1   methocarbamol (ROBAXIN) 500 MG tablet Take 1 tablet (500 mg total) by mouth 2 (two) times daily as needed for muscle spasms (sedation precautions). 180 tablet 0   No facility-administered medications prior to visit.  Per HPI unless specifically indicated in ROS section below Review of Systems  Objective:  BP 110/78 (BP Location: Left Arm, Patient Position: Sitting, Cuff Size: Large)   Pulse 62   Temp (!) 97.2 F (36.2 C)   Ht 6' (1.829 m)   Wt 206 lb (93.4 kg)   SpO2 98%   BMI 27.94 kg/m   Wt Readings from Last 3 Encounters:  09/15/22 206 lb (93.4 kg)  08/06/22 204 lb 6 oz (92.7 kg)  02/17/22 195 lb 9.6 oz (88.7 kg)      Physical Exam Vitals and nursing note reviewed.  Constitutional:      General: He is not in acute distress.    Appearance: Normal appearance. He is well-developed. He is not ill-appearing.  HENT:     Head: Normocephalic and atraumatic.     Right Ear: Hearing, tympanic membrane, ear canal and external ear normal.     Left Ear: Hearing, tympanic membrane, ear canal and external ear normal.     Mouth/Throat:     Mouth: Mucous membranes are moist.     Pharynx: Oropharynx is clear. No oropharyngeal exudate or posterior oropharyngeal erythema.  Eyes:     General: No scleral icterus.    Extraocular Movements: Extraocular movements intact.     Conjunctiva/sclera: Conjunctivae normal.     Pupils: Pupils are equal, round, and reactive to light.  Neck:     Thyroid: No thyroid mass or thyromegaly.  Cardiovascular:     Rate and Rhythm: Normal rate and regular rhythm.     Pulses: Normal pulses.          Radial pulses are 2+ on the right side and 2+ on the left side.     Heart sounds: Normal heart sounds. No murmur  heard. Pulmonary:     Effort: Pulmonary effort is normal. No respiratory distress.     Breath sounds: Normal breath sounds. No wheezing, rhonchi or rales.  Abdominal:     General: Bowel sounds are normal. There is no distension.     Palpations: Abdomen is soft. There is no mass.     Tenderness: There is no abdominal tenderness. There is no guarding or rebound.     Hernia: No hernia is present.  Musculoskeletal:        General: Normal range of motion.     Cervical back: Normal range of motion and neck supple.     Right lower leg: No edema.     Left lower leg: No edema.     Comments: Diminished pulses to LLE  Lymphadenopathy:     Cervical: No cervical adenopathy.  Skin:    General: Skin is warm and dry.     Findings: No rash.  Neurological:     General: No focal deficit present.     Mental Status: He is alert and oriented to person, place, and time.     Comments:  Recall 3/3  Calculation 5/5 DLROW   Psychiatric:        Mood and Affect: Mood normal.        Behavior: Behavior normal.        Thought Content: Thought content normal.        Judgment: Judgment normal.       Results for orders placed or performed in visit on 09/10/22  POCT INR  Result Value Ref Range   INR 2.8 2.0 - 3.0   *Note: Due to a large number of results and/or encounters for the requested time  period, some results have not been displayed. A complete set of results can be found in Results Review.    Assessment & Plan:   Problem List Items Addressed This Visit     Medicare annual wellness visit, subsequent - Primary (Chronic)    I have personally reviewed the Medicare Annual Wellness questionnaire and have noted 1. The patient's medical and social history 2. Their use of alcohol, tobacco or illicit drugs 3. Their current medications and supplements 4. The patient's functional ability including ADL's, fall risks, home safety risks and hearing or visual impairment. Cognitive function has been assessed  and addressed as indicated.  5. Diet and physical activity 6. Evidence for depression or mood disorders The patients weight, height, BMI have been recorded in the chart. I have made referrals, counseling and provided education to the patient based on review of the above and I have provided the pt with a written personalized care plan for preventive services. Provider list updated.. See scanned questionairre as needed for further documentation. Reviewed preventative protocols and updated unless pt declined.       Hyperlipidemia   Relevant Orders   Lipid panel   Comprehensive metabolic panel   Type 2 diabetes, controlled, with neuropathy (HCC)   Relevant Orders   Hemoglobin A1c   AAA (abdominal aortic aneurysm) without rupture (HCC)   Iron deficiency anemia   Relevant Orders   Ferritin   IBC panel   CBC with Differential/Platelet   Vitamin B12 deficiency   Relevant Orders   Vitamin B12   Vitamin D insufficiency   Relevant Orders   VITAMIN D 25 Hydroxy (Vit-D Deficiency, Fractures)   Prostate cancer ([place])   Relevant Orders   PSA   Right forearm pain   Relevant Orders   DG Forearm Right   CK     Meds ordered this encounter  Medications   methocarbamol (ROBAXIN) 500 MG tablet    Sig: Take 1 tablet (500 mg total) by mouth 2 (two) times daily as needed for muscle spasms (sedation precautions).    Dispense:  180 tablet    Refill:  0   Orders Placed This Encounter  Procedures   DG Forearm Right    Standing Status:   Future    Standing Expiration Date:   09/16/2023    Order Specific Question:   Reason for Exam (SYMPTOM  OR DIAGNOSIS REQUIRED)    Answer:   R forearm pain x 1 year    Order Specific Question:   Preferred imaging location?    Answer:   [facility]   Vitamin B12   Ferritin   IBC panel   VITAMIN D 25 Hydroxy (Vit-D Deficiency, Fractures)   Lipid panel   Comprehensive metabolic panel   Hemoglobin A1c   PSA   CBC with Differential/Platelet   CK     Patient instructions: Labs today  Call Dr Eddie Dibbles office about follow up plan.  Schedule eye exam.  For left foot pain - will order ABIs to be done at local heart doctor office For right arm - will check muscle function test in blood work today.  Return in 4 months for follow up visit.   Follow up plan: Return in about 4 months (around 01/16/2023) for follow up visit.  Ria Bush, MD

## 2022-09-16 DIAGNOSIS — M79672 Pain in left foot: Secondary | ICD-10-CM | POA: Insufficient documentation

## 2022-09-16 LAB — COMPREHENSIVE METABOLIC PANEL
ALT: 15 U/L (ref 0–53)
AST: 16 U/L (ref 0–37)
Albumin: 3.6 g/dL (ref 3.5–5.2)
Alkaline Phosphatase: 84 U/L (ref 39–117)
BUN: 15 mg/dL (ref 6–23)
CO2: 30 mEq/L (ref 19–32)
Calcium: 8.6 mg/dL (ref 8.4–10.5)
Chloride: 102 mEq/L (ref 96–112)
Creatinine, Ser: 1.08 mg/dL (ref 0.40–1.50)
GFR: 65.79 mL/min (ref >60.00)
Glucose, Bld: 180 mg/dL — ABNORMAL HIGH (ref 70–99)
Potassium: 4.8 mEq/L (ref 3.5–5.1)
Sodium: 139 mEq/L (ref 135–145)
Total Bilirubin: 0.6 mg/dL (ref 0.2–1.2)
Total Protein: 6.2 g/dL (ref 6.0–8.3)

## 2022-09-16 LAB — VITAMIN B12: Vitamin B-12: 1270 pg/mL — ABNORMAL HIGH (ref 211–911)

## 2022-09-16 LAB — LIPID PANEL
Cholesterol: 117 mg/dL (ref 0–200)
HDL: 28.1 mg/dL — ABNORMAL LOW (ref >39.00)
NonHDL: 88.77
Total CHOL/HDL Ratio: 4
Triglycerides: 336 mg/dL — ABNORMAL HIGH (ref 0.0–149.0)
VLDL: 67.2 mg/dL — ABNORMAL HIGH (ref 0.0–40.0)

## 2022-09-16 LAB — VITAMIN D 25 HYDROXY (VIT D DEFICIENCY, FRACTURES): VITD: 36.6 ng/mL (ref 30.00–100.00)

## 2022-09-16 LAB — LDL CHOLESTEROL, DIRECT: Direct LDL: 74 mg/dL

## 2022-09-16 LAB — CBC WITH DIFFERENTIAL/PLATELET
Basophils Absolute: 0 10*3/uL (ref 0.0–0.1)
Basophils Relative: 0.6 % (ref 0.0–3.0)
Eosinophils Absolute: 0.1 10*3/uL (ref 0.0–0.7)
Eosinophils Relative: 1.5 % (ref 0.0–5.0)
HCT: 42 % (ref 39.0–52.0)
Hemoglobin: 14.2 g/dL (ref 13.0–17.0)
Lymphocytes Relative: 28 % (ref 12.0–46.0)
Lymphs Abs: 2.3 10*3/uL (ref 0.7–4.0)
MCHC: 33.7 g/dL (ref 30.0–36.0)
MCV: 96.8 fl (ref 78.0–100.0)
Monocytes Absolute: 0.5 10*3/uL (ref 0.1–1.0)
Monocytes Relative: 5.6 % (ref 3.0–12.0)
Neutro Abs: 5.3 10*3/uL (ref 1.4–7.7)
Neutrophils Relative %: 64.3 % (ref 43.0–77.0)
Platelets: 196 10*3/uL (ref 150.0–400.0)
RBC: 4.34 Mil/uL (ref 4.22–5.81)
RDW: 14.1 % (ref 11.5–15.5)
WBC: 8.2 10*3/uL (ref 4.0–10.5)

## 2022-09-16 LAB — FERRITIN: Ferritin: 38.3 ng/mL (ref 22.0–322.0)

## 2022-09-16 LAB — IBC PANEL
Iron: 106 ug/dL (ref 42–165)
Saturation Ratios: 33.5 % (ref 20.0–50.0)
TIBC: 316.4 ug/dL (ref 250.0–450.0)
Transferrin: 226 mg/dL (ref 212.0–360.0)

## 2022-09-16 LAB — PSA: PSA: 0.08 ng/mL — ABNORMAL LOW (ref 0.10–4.00)

## 2022-09-16 LAB — CK: Total CK: 64 U/L (ref 7–232)

## 2022-09-16 LAB — HEMOGLOBIN A1C: Hgb A1c MFr Bld: 8.3 % — ABNORMAL HIGH (ref 4.6–6.5)

## 2022-09-16 NOTE — Assessment & Plan Note (Signed)
Update levels on oral daily replacement.

## 2022-09-16 NOTE — Assessment & Plan Note (Signed)
S/p endovascular repair. Last saw [facility] 2021. Discussed need for yearly evaluation - advised he call Dr Eddie Dibbles office for appointment.

## 2022-09-16 NOTE — Assessment & Plan Note (Signed)
Long term smoker.  Recommend continue daily controller medication.

## 2022-09-16 NOTE — Assessment & Plan Note (Addendum)
Chronic, stable. Continue amlodipine, diltiazem, metoprolol, spironolactone and furosemide.

## 2022-09-16 NOTE — Assessment & Plan Note (Signed)
Advanced directive discussion - working on this at home. Wife would be HCPOA. Encouraged he finish filling out.

## 2022-09-16 NOTE — Assessment & Plan Note (Signed)
Continued 1ppd smoker.  Aged out of lung cancer screen.

## 2022-09-16 NOTE — Assessment & Plan Note (Signed)
Update FLP. Continues pravastatin '20mg'$  daily. Intolerant to higher potency statins. The 10-year ASCVD risk score (Arnett DK, et al., 2019) is: 44.6%   Values used to calculate the score:     Age: 76 years     Sex: Male     Is Non-Hispanic African American: No     Diabetic: Yes     Tobacco smoker: Yes     Systolic Blood Pressure: 569 mmHg     Is BP treated: Yes     HDL Cholesterol: 42.7 mg/dL     Total Cholesterol: 145 mg/dL

## 2022-09-16 NOTE — Assessment & Plan Note (Signed)
Update A1c only on metformin XR '500mg'$  once daily.

## 2022-09-16 NOTE — Assessment & Plan Note (Signed)
This is followed by [facility]. He is due to see them - advised call and schedule appointment.

## 2022-09-16 NOTE — Assessment & Plan Note (Signed)
S/p XRT ([facility], [place]). Update PSA today.

## 2022-09-16 NOTE — Assessment & Plan Note (Signed)
[place] CSRS reviewed.  He continues hydrocodone 10/'325mg'$ 

## 2022-09-16 NOTE — Assessment & Plan Note (Signed)
Continues gabapentin '600mg'$  bid.

## 2022-09-16 NOTE — Assessment & Plan Note (Addendum)
Of unclear cause, anticipate muscular pain as he's tender over brachioradialis mm. Check arm xray today to evaluate radius/ulna. Check CPK in statin use. If normal, rec start voltaren gel.

## 2022-09-16 NOTE — Assessment & Plan Note (Addendum)
Diminished pulses noted on left. Not consistent with neuropathic pain. Need to r/o arterial insufficiency. Will update ABIs locally, work towards getting him back to [facility] for re evaluation.

## 2022-09-16 NOTE — Assessment & Plan Note (Signed)
Continues lasix '20mg'$  PRN.

## 2022-09-16 NOTE — Assessment & Plan Note (Signed)
Continues amiodarone and coumadin.

## 2022-09-20 ENCOUNTER — Other Ambulatory Visit: Payer: Self-pay | Admitting: Family Medicine

## 2022-09-20 MED ORDER — B-12 1000 MCG SL SUBL: 1.0000 | SUBLINGUAL_TABLET | SUBLINGUAL | Status: AC

## 2022-09-29 ENCOUNTER — Encounter: Payer: Self-pay | Admitting: Cardiovascular Disease

## 2022-10-02 ENCOUNTER — Other Ambulatory Visit: Payer: Self-pay | Admitting: Family Medicine

## 2022-10-02 NOTE — Telephone Encounter (Signed)
Message from pt/pt's wife via pharmacy:   Hi Dr. Darnell Level, I am requesting a refill for spironolactone. Alf is out of the medication. Please submit the refill to [facility], [place] location. If you have any questions, please let me know.      Last OV:  09/15/22, [facility] Next OV:  01/16/23, 4 mo f/u

## 2022-10-03 MED ORDER — SPIRONOLACTONE 25 MG PO TABS: 12.5000 mg | ORAL_TABLET | Freq: Every day | ORAL | 1 refills | Status: DC

## 2022-10-03 NOTE — Telephone Encounter (Signed)
ERx 

## 2022-10-14 ENCOUNTER — Ambulatory Visit: Payer: Medicare Other | Admitting: Family Medicine

## 2022-10-21 ENCOUNTER — Other Ambulatory Visit: Payer: Self-pay | Admitting: Family Medicine

## 2022-10-21 DIAGNOSIS — G894 Chronic pain syndrome: Secondary | ICD-10-CM

## 2022-10-21 MED ORDER — HYDROCODONE-ACETAMINOPHEN 10-325 MG PO TABS: 1.0000 | ORAL_TABLET | Freq: Four times a day (QID) | ORAL | 0 refills | Status: DC | PRN

## 2022-10-21 NOTE — Telephone Encounter (Signed)
ERx 

## 2022-10-21 NOTE — Telephone Encounter (Signed)
Name of Medication: Hydrocodone-APAP Name of Pharmacy: [facility] or Written Date and Quantity: 09/13/22, #120 Last Office Visit and Type: 09/15/22, AWV Next Office Visit and Type: 01/16/23, 4 mo f/u Last Controlled Substance Agreement Date: 07/06/17 Last UDS: 02/03/18

## 2022-10-22 ENCOUNTER — Ambulatory Visit: Payer: Medicare Other | Attending: Cardiovascular Disease

## 2022-10-22 DIAGNOSIS — I4891 Unspecified atrial fibrillation: Secondary | ICD-10-CM

## 2022-10-22 DIAGNOSIS — Z5181 Encounter for therapeutic drug level monitoring: Secondary | ICD-10-CM | POA: Diagnosis not present

## 2022-10-22 LAB — POCT INR: INR: 2.6 (ref 2.0–3.0)

## 2022-10-22 NOTE — Patient Instructions (Signed)
-   Continue 1.5 tablets every day - Recheck INR in 6 weeks

## 2022-10-25 ENCOUNTER — Other Ambulatory Visit: Payer: Self-pay | Admitting: Family Medicine

## 2022-10-27 ENCOUNTER — Ambulatory Visit: Payer: Medicare Other | Admitting: Internal Medicine

## 2022-10-27 ENCOUNTER — Other Ambulatory Visit: Payer: Self-pay | Admitting: Family Medicine

## 2022-10-27 DIAGNOSIS — G894 Chronic pain syndrome: Secondary | ICD-10-CM

## 2022-10-28 DIAGNOSIS — S6990XA Unspecified injury of unspecified wrist, hand and finger(s), initial encounter: Secondary | ICD-10-CM | POA: Insufficient documentation

## 2022-10-28 MED ORDER — HYDROCODONE-ACETAMINOPHEN 10-325 MG PO TABS: 1.0000 | ORAL_TABLET | Freq: Four times a day (QID) | ORAL | 0 refills | Status: DC | PRN

## 2022-10-28 NOTE — Telephone Encounter (Signed)
ERx to new pharmacy

## 2022-10-28 NOTE — Progress Notes (Signed)
Finger injury and no x-ray here He decided to go elsewhere

## 2022-10-28 NOTE — Assessment & Plan Note (Signed)
No visit He left to find someplace with x-ray

## 2022-11-04 ENCOUNTER — Ambulatory Visit (INDEPENDENT_AMBULATORY_CARE_PROVIDER_SITE_OTHER): Payer: Medicare Other | Admitting: Sports Medicine

## 2022-11-04 ENCOUNTER — Ambulatory Visit (INDEPENDENT_AMBULATORY_CARE_PROVIDER_SITE_OTHER): Payer: Medicare Other

## 2022-11-04 VITALS — Ht 72.0 in | Wt 206.0 lb

## 2022-11-04 DIAGNOSIS — M79641 Pain in right hand: Secondary | ICD-10-CM

## 2022-11-04 DIAGNOSIS — M7989 Other specified soft tissue disorders: Secondary | ICD-10-CM | POA: Diagnosis not present

## 2022-11-04 MED ORDER — CEPHALEXIN 500 MG PO CAPS: 500.0000 mg | ORAL_CAPSULE | Freq: Two times a day (BID) | ORAL | 0 refills | Status: DC

## 2022-11-04 NOTE — Patient Instructions (Addendum)
Good to see you  Recommend using tylenol  Antibiotic  As needed follow up

## 2022-11-04 NOTE — Progress Notes (Signed)
[name] D.[place] Phone: [phone]   Assessment and Plan:     1. Right hand pain -Acute, uncertain etiology, initial sports medicine visit - Unclear etiology of swelling and pain to DIP and distal phalanx of second digit of right hand.  Patient thinks that he may have a splinter of some kind stuck in his finger, though does not specifically remember an incident.  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  My interpretation: No acute fracture or dislocation.  No foreign body seen at palmar surface of second DIP and distal phalanx where patient complains of pain.  Cortical changes seen at PIP as well as a small density seen over dorsal surface of second PIP, however these do not correlate with area of patient's symptoms - Due to mild skin breakdown, reported swelling and erythema and warmth fluctuating over the past 3 to 4 weeks, we will prescribe an antibiotic for patient to use to treat a potentially infected splinter.  I do not see any clear active infection, open lesion, or foreign body. - Do not recommend NSAID use due to chronic anticoagulation on warfarin - May use Tylenol for day-to-day pain relief - DG Hand Complete Right; Future - cephALEXin (KEFLEX) 500 MG capsule; Take 1 capsule (500 mg total) by mouth 2 (two) times daily.    Pertinent previous records reviewed include none   Follow Up: As needed   Subjective:   I, [name], am serving as a Education administrator for [name]  Chief Complaint: finger pain   HPI:   11/04/22 Patient is a 76 year old male complaining of finger pain. Patient states he thinks he has a piece of metal in his finger for about a month , intermittent swelling no numbness or tingling just soreness , last night he wast able to sleep he is leaving for [facility] tomorrow morning, him and his wife were poking around  on it and that was a big mistake   Relevant Historical Information: Chronically anticoagulated on warfarin, CHF, hypertension, COPD, GERD, DM type II, tobacco use, history of prostate cancer  Additional pertinent review of systems negative.   Current Outpatient Medications:    albuterol (VENTOLIN HFA) 108 (90 Base) MCG/ACT inhaler, Inhale 2 puffs into the lungs every 6 (six) hours as needed for wheezing or shortness of breath., Disp: 8.5 g, Rfl: 3   amiodarone (PACERONE) 200 MG tablet, Take 200 mg by mouth as needed. Take for fast heart rate>100, Disp: , Rfl:    amLODipine (NORVASC) 5 MG tablet, Take 5 mg by mouth as needed. For SBP > 160, Disp: , Rfl:    aspirin 81 MG EC tablet, Take 1 tablet (81 mg total) by mouth daily. Swallow whole., Disp: , Rfl:    Budeson-Glycopyrrol-Formoterol (BREZTRI AEROSPHERE) 160-9-4.8 MCG/ACT AERO, Inhale 2 puffs into the lungs in the morning and at bedtime., Disp: 3 each, Rfl: 3   cephALEXin (KEFLEX) 500 MG capsule, Take 1 capsule (500 mg total) by mouth 2 (two) times daily., Disp: 14 capsule, Rfl: 0   Cholecalciferol (VITAMIN D) 50 MCG (2000 UT) CAPS, Take 1 capsule (2,000 Units total) by mouth daily., Disp: 30 capsule, Rfl:    Coenzyme Q10 (COQ-10 PO), Take 1 tablet by mouth daily., Disp: , Rfl:    Cyanocobalamin (B-12) 1000 MCG SUBL, Place 1 tablet under the tongue every Monday, Wednesday, and Friday., Disp:  30 tablet, Rfl:    diclofenac Sodium (VOLTAREN) 1 % GEL, Apply 4 g topically 3 (three) times daily. (Patient taking differently: Apply 4 g topically 3 (three) times daily. As needed), Disp: 100 g, Rfl: 3   diltiazem (CARDIZEM) 30 MG tablet, Take 1 tablet (30 mg total) by mouth daily as needed (fast heart rate)., Disp: 30 tablet, Rfl: 5   diphenoxylate-atropine (LOMOTIL) 2.5-0.025 MG tablet, Take 1 tablet by mouth 2 (two) times daily as needed., Disp: 20 tablet, Rfl: 0   Ferrous Sulfate (IRON) 325 (65 Fe) MG TABS, Take by mouth daily., Disp: , Rfl:    fish  oil-omega-3 fatty acids 1000 MG capsule, Take 2 g by mouth daily., Disp: , Rfl:    furosemide (LASIX) 20 MG tablet, TAKE 1 TABLET BY MOUTH EVERY DAY AS NEEDED FOR FLUID RETENTION, Disp: 90 tablet, Rfl: 0   gabapentin (NEURONTIN) 600 MG tablet, Take 1 tablet (600 mg total) by mouth 2 (two) times daily., Disp: 180 tablet, Rfl: 1   HYDROcodone-acetaminophen (NORCO) 10-325 MG tablet, Take 1 tablet by mouth every 6 (six) hours as needed for moderate pain or severe pain., Disp: 120 tablet, Rfl: 0   ipratropium-albuterol (DUONEB) 0.5-2.5 (3) MG/3ML SOLN, Take 3 mLs by nebulization every 6 (six) hours as needed., Disp: 360 mL, Rfl: 11   metFORMIN (GLUCOPHAGE-XR) 500 MG 24 hr tablet, Take 1 tablet (500 mg total) by mouth daily with breakfast., Disp: 90 tablet, Rfl: 1   methocarbamol (ROBAXIN) 500 MG tablet, Take 1 tablet (500 mg total) by mouth 2 (two) times daily as needed for muscle spasms (sedation precautions)., Disp: 180 tablet, Rfl: 0   metoprolol tartrate (LOPRESSOR) 25 MG tablet, Take 1 tablet (25 mg total) by mouth 2 (two) times daily. Take for fast heart rate>100, Disp: 180 tablet, Rfl: 2   midodrine (PROAMATINE) 10 MG tablet, Take 1 tablet (10 mg total) by mouth 3 (three) times daily as needed., Disp: 90 tablet, Rfl: 1   nitroGLYCERIN (NITROLINGUAL) 0.4 MG/SPRAY spray, USE 1 SPRAY AS DIRECTED EVERY 5 MINUTES AS NEEDED, Disp: 4.9 g, Rfl: 1   omeprazole (PRILOSEC) 40 MG capsule, Take 1 capsule (40 mg total) by mouth daily., Disp: 90 capsule, Rfl: 3   pravastatin (PRAVACHOL) 20 MG tablet, TAKE 1 TABLET(20 MG) BY MOUTH DAILY, Disp: 90 tablet, Rfl: 3   spironolactone (ALDACTONE) 25 MG tablet, Take 0.5 tablets (12.5 mg total) by mouth daily., Disp: 45 tablet, Rfl: 1   warfarin (COUMADIN) 2.5 MG tablet, TAKE BY MOUTH AS DIRECTED AFTER ANTI-COAG CLINIC, Disp: 135 tablet, Rfl: 1   Objective:     Vitals:   11/04/22 1420  Weight: 206 lb (93.4 kg)  Height: 6' (1.829 m)      Body mass index is 27.94  kg/m.    Physical Exam:    General: Appears well, nad, nontoxic and pleasant Neuro:sensation intact, strength is 5/5 with df/pf/inv/ev, muscle tone wnl Skin:no susupicious lesions or rashes  Right hand/wrist:  No deformity Mild diffuse swelling over second DIP and distal phalanx without erythema, open lesion.  Mild skin breakdown on palmar surface of second distal phalanx and associated TTP with restricted flexion and extension.    Electronically signed by:  [name] D.[facility] 2:59 PM 11/04/22

## 2022-11-11 ENCOUNTER — Encounter: Payer: Self-pay | Admitting: Family Medicine

## 2022-11-11 ENCOUNTER — Ambulatory Visit (INDEPENDENT_AMBULATORY_CARE_PROVIDER_SITE_OTHER): Payer: Medicare Other | Admitting: Family Medicine

## 2022-11-11 VITALS — BP 118/72 | HR 58 | Temp 97.8°F | Ht 72.0 in | Wt 217.0 lb

## 2022-11-11 DIAGNOSIS — I25118 Atherosclerotic heart disease of native coronary artery with other forms of angina pectoris: Secondary | ICD-10-CM | POA: Diagnosis not present

## 2022-11-11 DIAGNOSIS — M7989 Other specified soft tissue disorders: Secondary | ICD-10-CM

## 2022-11-11 DIAGNOSIS — E1140 Type 2 diabetes mellitus with diabetic neuropathy, unspecified: Secondary | ICD-10-CM | POA: Diagnosis not present

## 2022-11-11 DIAGNOSIS — Z7901 Long term (current) use of anticoagulants: Secondary | ICD-10-CM | POA: Diagnosis not present

## 2022-11-11 LAB — CBC WITH DIFFERENTIAL/PLATELET
Basophils Absolute: 0 10*3/uL (ref 0.0–0.1)
Basophils Relative: 0.7 % (ref 0.0–3.0)
Eosinophils Absolute: 0.1 10*3/uL (ref 0.0–0.7)
Eosinophils Relative: 1.8 % (ref 0.0–5.0)
HCT: 41.1 % (ref 39.0–52.0)
Hemoglobin: 14 g/dL (ref 13.0–17.0)
Lymphocytes Relative: 31.5 % (ref 12.0–46.0)
Lymphs Abs: 2.1 10*3/uL (ref 0.7–4.0)
MCHC: 34 g/dL (ref 30.0–36.0)
MCV: 96.5 fl (ref 78.0–100.0)
Monocytes Absolute: 0.4 10*3/uL (ref 0.1–1.0)
Monocytes Relative: 6.1 % (ref 3.0–12.0)
Neutro Abs: 4 10*3/uL (ref 1.4–7.7)
Neutrophils Relative %: 59.9 % (ref 43.0–77.0)
Platelets: 193 10*3/uL (ref 150.0–400.0)
RBC: 4.26 Mil/uL (ref 4.22–5.81)
RDW: 13.7 % (ref 11.5–15.5)
WBC: 6.6 10*3/uL (ref 4.0–10.5)

## 2022-11-11 LAB — URIC ACID: Uric Acid, Serum: 4.3 mg/dL (ref 4.0–7.8)

## 2022-11-11 LAB — C-REACTIVE PROTEIN: CRP: 1.5 mg/dL (ref 0.5–20.0)

## 2022-11-11 LAB — SEDIMENTATION RATE: Sed Rate: 27 mm/hr — ABNORMAL HIGH (ref 0–20)

## 2022-11-11 MED ORDER — CEPHALEXIN 500 MG PO CAPS: 500.0000 mg | ORAL_CAPSULE | Freq: Three times a day (TID) | ORAL | 0 refills | Status: DC

## 2022-11-11 MED ORDER — PREDNISONE 20 MG PO TABS: ORAL_TABLET | ORAL | 0 refills | Status: DC

## 2022-11-11 NOTE — Assessment & Plan Note (Signed)
Of unclear cause. Pt states it started after splinter. No foreign object noted. He already received keflex 574m BID 1 wk course. Pain is better but swelling remains, now pain and swelling has extended to 5th MCPJ.  ?infection vs gout Check labs (CRP, ESR, CBC, urate).  Abx choice limited by coumadin use.  Restart keflex course 5088mTID x1 wk, start prednisone taper.  Update if worsening for further evaluation and possibly ortho referral.

## 2022-11-11 NOTE — Patient Instructions (Addendum)
Labs today Start higher dose keflex '500mg'$  three times daily for 1 week.  Start prednisone taper sent to pharmacy While you're on prednisone, increase metformin to twice daily.  Let us know if any worsening hand pain, swelling, streaking redness, fever, malaise, nausea.

## 2022-11-11 NOTE — Assessment & Plan Note (Addendum)
Watch for hyperglycemia with prednisone. Limit sugars/carbs while on steroid, increase metformin to '500mg'$  BID while on steroids. Sulfonylurea intolerance (diarrhea).

## 2022-11-11 NOTE — Progress Notes (Signed)
Patient ID: [name], male    DOB: 09/12/1946, 76 y.o.   MRN: [id number]  This visit was conducted in person.  BP 118/72   Pulse (!) 58   Temp 97.8 F (36.6 C) (Temporal)   Ht 6' (1.829 m)   Wt 217 lb (98.4 kg)   SpO2 99%   BMI 29.43 kg/m    CC: R hand/finger swelling Subjective:   HPI: [name] is a 76 y.o. male presenting on 11/11/2022 for Edema (C/o R hand/finger swelling. Started on about 1 mo ago.  See for previously for same sxs. Pt accompanied by wife, [name]. )   1 month history of right hand pain and swelling.  Denies inciting trauma/injury.  He thinks he got metal or wood splinter into finger.  Wife dug into finger with sterilized pin needle.   Saw sports medicine last week, note reviewed. Xray results as per below. Treated with keflex BID 7d course.   Since then, right index finger pain is better but swelling persists, numbness now of index finger, along with pain and swelling of dorsal ulnar hand.   No fevers/chills, redness.   DG Hand Complete Right CLINICAL DATA:  Right hand pain in the index finger  EXAM: RIGHT HAND - COMPLETE 3+ VIEW  COMPARISON:  None Available.  FINDINGS: Tiny osseous fragment about the radial/dorsal base of the index finger middle phalanx may be a small fracture fragment. Mild swelling about the index finger. Degenerative arthritis first CMC joint.  IMPRESSION: Tiny osseous fragment about the radial/dorsal base of the index finger middle phalanx may be a small fracture fragment. Mild swelling about the index finger.  Electronically Signed   By: Placido Sou M.D.   On: 11/05/2022 18:44       Relevant past medical, surgical, family and social history reviewed and updated as indicated. Interim medical history since our last visit reviewed. Allergies and medications reviewed and updated. Outpatient Medications Prior to Visit  Medication Sig Dispense Refill   albuterol (VENTOLIN HFA) 108 (90 Base) MCG/ACT  inhaler Inhale 2 puffs into the lungs every 6 (six) hours as needed for wheezing or shortness of breath. 8.5 g 3   amiodarone (PACERONE) 200 MG tablet Take 200 mg by mouth as needed. Take for fast heart rate>100     amLODipine (NORVASC) 5 MG tablet Take 5 mg by mouth as needed. For SBP > 160     aspirin 81 MG EC tablet Take 1 tablet (81 mg total) by mouth daily. Swallow whole.     Budeson-Glycopyrrol-Formoterol (BREZTRI AEROSPHERE) 160-9-4.8 MCG/ACT AERO Inhale 2 puffs into the lungs in the morning and at bedtime. 3 each 3   Cholecalciferol (VITAMIN D) 50 MCG (2000 UT) CAPS Take 1 capsule (2,000 Units total) by mouth daily. 30 capsule    Coenzyme Q10 (COQ-10 PO) Take 1 tablet by mouth daily.     Cyanocobalamin (B-12) 1000 MCG SUBL Place 1 tablet under the tongue every Monday, Wednesday, and Friday. 30 tablet    diclofenac Sodium (VOLTAREN) 1 % GEL Apply 4 g topically 3 (three) times daily. (Patient taking differently: Apply 4 g topically 3 (three) times daily. As needed) 100 g 3   diltiazem (CARDIZEM) 30 MG tablet Take 1 tablet (30 mg total) by mouth daily as needed (fast heart rate). 30 tablet 5   diphenoxylate-atropine (LOMOTIL) 2.5-0.025 MG tablet Take 1 tablet by mouth 2 (two) times daily as needed. 20 tablet 0   Ferrous Sulfate (IRON) 325 (  65 Fe) MG TABS Take by mouth daily.     fish oil-omega-3 fatty acids 1000 MG capsule Take 2 g by mouth daily.     furosemide (LASIX) 20 MG tablet TAKE 1 TABLET BY MOUTH EVERY DAY AS NEEDED FOR FLUID RETENTION 90 tablet 0   gabapentin (NEURONTIN) 600 MG tablet Take 1 tablet (600 mg total) by mouth 2 (two) times daily. 180 tablet 1   HYDROcodone-acetaminophen (NORCO) 10-325 MG tablet Take 1 tablet by mouth every 6 (six) hours as needed for moderate pain or severe pain. 120 tablet 0   ipratropium-albuterol (DUONEB) 0.5-2.5 (3) MG/3ML SOLN Take 3 mLs by nebulization every 6 (six) hours as needed. 360 mL 11   metFORMIN (GLUCOPHAGE-XR) 500 MG 24 hr tablet Take 1  tablet (500 mg total) by mouth daily with breakfast. 90 tablet 1   methocarbamol (ROBAXIN) 500 MG tablet Take 1 tablet (500 mg total) by mouth 2 (two) times daily as needed for muscle spasms (sedation precautions). 180 tablet 0   metoprolol tartrate (LOPRESSOR) 25 MG tablet Take 1 tablet (25 mg total) by mouth 2 (two) times daily. Take for fast heart rate>100 180 tablet 2   midodrine (PROAMATINE) 10 MG tablet Take 1 tablet (10 mg total) by mouth 3 (three) times daily as needed. 90 tablet 1   nitroGLYCERIN (NITROLINGUAL) 0.4 MG/SPRAY spray USE 1 SPRAY AS DIRECTED EVERY 5 MINUTES AS NEEDED 4.9 g 1   omeprazole (PRILOSEC) 40 MG capsule Take 1 capsule (40 mg total) by mouth daily. 90 capsule 3   pravastatin (PRAVACHOL) 20 MG tablet TAKE 1 TABLET(20 MG) BY MOUTH DAILY 90 tablet 3   spironolactone (ALDACTONE) 25 MG tablet Take 0.5 tablets (12.5 mg total) by mouth daily. 45 tablet 1   warfarin (COUMADIN) 2.5 MG tablet TAKE BY MOUTH AS DIRECTED AFTER ANTI-COAG CLINIC 135 tablet 1   cephALEXin (KEFLEX) 500 MG capsule Take 1 capsule (500 mg total) by mouth 2 (two) times daily. 14 capsule 0   No facility-administered medications prior to visit.     Per HPI unless specifically indicated in ROS section below Review of Systems  Objective:  BP 118/72   Pulse (!) 58   Temp 97.8 F (36.6 C) (Temporal)   Ht 6' (1.829 m)   Wt 217 lb (98.4 kg)   SpO2 99%   BMI 29.43 kg/m   Wt Readings from Last 3 Encounters:  11/11/22 217 lb (98.4 kg)  11/04/22 206 lb (93.4 kg)  09/15/22 206 lb (93.4 kg)      Physical Exam Vitals and nursing note reviewed.  Constitutional:      Appearance: Normal appearance. He is not ill-appearing.  Musculoskeletal:        General: Swelling and tenderness present. No deformity.  Skin:    General: Skin is warm and dry.     Findings: No erythema or rash.     Comments:  Few abrasions to dorsal hands Tenderness to palpation along R dorsal radial 2nd PIP joint as well as at  dorsal 5th MCP, swelling present to dorsal hand into 5th digit as well as 2nd digit No streaking erythema   Neurological:     Mental Status: He is alert.       Results for orders placed or performed in visit on 10/22/22  POCT INR  Result Value Ref Range   INR 2.6 2.0 - 3.0   POC INR     *Note: Due to a large number of results and/or encounters for the  requested time period, some results have not been displayed. A complete set of results can be found in Results Review.   Lab Results  Component Value Date   HGBA1C 8.3 (H) 09/15/2022   Assessment & Plan:   Problem List Items Addressed This Visit       Unprioritized   Type 2 diabetes, controlled, with neuropathy ([place])    Watch for hyperglycemia with prednisone. Limit sugars/carbs while on steroid, increase metformin to 530m BID while on steroids. Sulfonylurea intolerance (diarrhea).       Current use of long term anticoagulation (Coumadin)   Swelling of right hand - Primary    Of unclear cause. Pt states it started after splinter. No foreign object noted. He already received keflex 5066mBID 1 wk course. Pain is better but swelling remains, now pain and swelling has extended to 5th MCPJ.  ?infection vs gout Check labs (CRP, ESR, CBC, urate).  Abx choice limited by coumadin use.  Restart keflex course 50065mID x1 wk, start prednisone taper.  Update if worsening for further evaluation and possibly ortho referral.       Relevant Orders   Sedimentation rate   CBC with Differential/Platelet   Uric acid   C-reactive protein     Meds ordered this encounter  Medications   cephALEXin (KEFLEX) 500 MG capsule    Sig: Take 1 capsule (500 mg total) by mouth 3 (three) times daily.    Dispense:  21 capsule    Refill:  0   predniSONE (DELTASONE) 20 MG tablet    Sig: Take two tablets daily for 3 days followed by one tablet daily for 4 days    Dispense:  10 tablet    Refill:  0   Orders Placed This Encounter  Procedures    Sedimentation rate   CBC with Differential/Platelet   Uric acid   C-reactive protein     Patient Instructions  Labs today Start higher dose keflex 500m9mree times daily for 1 week.  Start prednisone taper sent to pharmacy While you're on prednisone, increase metformin to twice daily.  Let us [place] if any worsening hand pain, swelling, streaking redness, fever, malaise, nausea.   Follow up plan: Return if symptoms worsen or fail to improve.  JaviRia Bush

## 2022-11-12 ENCOUNTER — Telehealth: Payer: Self-pay

## 2022-11-12 NOTE — Chronic Care Management (AMB) (Signed)
Chronic Care Management Pharmacy Assistant   Name: CHRISTOPHR CALIX  MRN: [id number] DOB: 10-08-1946  Reason for Encounter:COPD Disease State    Recent office visits:  11/11/22-Javier Gutierrez,MD(PCP)-Edema hand,xray,labs,Restart keflex course '500mg'$  TID x1 wk, start prednisone taper. Update if worsening for further evaluation and possibly ortho referral.  10/27/22- Richard Letvak,MD(fam med)- finger injury- left to go to elsewhere 09/15/22-Javier Gutierrez,MD(PCP)-AWV,labs (Your prostate level returns stable at 0.08. your vitamin D, muscle function, kidneys and liver returned ok. Iron levels and blood counts are great - continue oral iron if tolerated.Your vitamin B12 was too high - drop vitamin B12 replacement to Monday, Wednesday, Friday) xray,refer for ABI's,discussed screenings, f/u 4 months   Recent consult visits:  11/04/22-Benjamin Jackson,DO(fam med sports)-recommend tylenol for pain, start keflex '500mg'$  two times daily,f/u as needed 08/06/22-Timothy Gollan,MD(cardio)-f/u Afib,EKG,recommend he stop the amiodarone and take as needed for fast heart rate.take Diltiazem TID for rapid HR  would recommend he take metoprolol tartrate 25 twice daily For extreme elevated heart rate with symptoms , could take amiodarone as needed.  Hospital visits:  None in previous 6 months  Medications: Outpatient Encounter Medications as of 11/12/2022  Medication Sig   albuterol (VENTOLIN HFA) 108 (90 Base) MCG/ACT inhaler Inhale 2 puffs into the lungs every 6 (six) hours as needed for wheezing or shortness of breath.   amiodarone (PACERONE) 200 MG tablet Take 200 mg by mouth as needed. Take for fast heart rate>100   amLODipine (NORVASC) 5 MG tablet Take 5 mg by mouth as needed. For SBP > 160   aspirin 81 MG EC tablet Take 1 tablet (81 mg total) by mouth daily. Swallow whole.   Budeson-Glycopyrrol-Formoterol (BREZTRI AEROSPHERE) 160-9-4.8 MCG/ACT AERO Inhale 2 puffs into the lungs in the morning and at  bedtime.   cephALEXin (KEFLEX) 500 MG capsule Take 1 capsule (500 mg total) by mouth 3 (three) times daily.   Cholecalciferol (VITAMIN D) 50 MCG (2000 UT) CAPS Take 1 capsule (2,000 Units total) by mouth daily.   Coenzyme Q10 (COQ-10 PO) Take 1 tablet by mouth daily.   Cyanocobalamin (B-12) 1000 MCG SUBL Place 1 tablet under the tongue every Monday, Wednesday, and Friday.   diclofenac Sodium (VOLTAREN) 1 % GEL Apply 4 g topically 3 (three) times daily. (Patient taking differently: Apply 4 g topically 3 (three) times daily. As needed)   diltiazem (CARDIZEM) 30 MG tablet Take 1 tablet (30 mg total) by mouth daily as needed (fast heart rate).   diphenoxylate-atropine (LOMOTIL) 2.5-0.025 MG tablet Take 1 tablet by mouth 2 (two) times daily as needed.   Ferrous Sulfate (IRON) 325 (65 Fe) MG TABS Take by mouth daily.   fish oil-omega-3 fatty acids 1000 MG capsule Take 2 g by mouth daily.   furosemide (LASIX) 20 MG tablet TAKE 1 TABLET BY MOUTH EVERY DAY AS NEEDED FOR FLUID RETENTION   gabapentin (NEURONTIN) 600 MG tablet Take 1 tablet (600 mg total) by mouth 2 (two) times daily.   HYDROcodone-acetaminophen (NORCO) 10-325 MG tablet Take 1 tablet by mouth every 6 (six) hours as needed for moderate pain or severe pain.   ipratropium-albuterol (DUONEB) 0.5-2.5 (3) MG/3ML SOLN Take 3 mLs by nebulization every 6 (six) hours as needed.   metFORMIN (GLUCOPHAGE-XR) 500 MG 24 hr tablet Take 1 tablet (500 mg total) by mouth daily with breakfast.   methocarbamol (ROBAXIN) 500 MG tablet Take 1 tablet (500 mg total) by mouth 2 (two) times daily as needed for muscle spasms (sedation precautions).  metoprolol tartrate (LOPRESSOR) 25 MG tablet Take 1 tablet (25 mg total) by mouth 2 (two) times daily. Take for fast heart rate>100   midodrine (PROAMATINE) 10 MG tablet Take 1 tablet (10 mg total) by mouth 3 (three) times daily as needed.   nitroGLYCERIN (NITROLINGUAL) 0.4 MG/SPRAY spray USE 1 SPRAY AS DIRECTED EVERY 5  MINUTES AS NEEDED   omeprazole (PRILOSEC) 40 MG capsule Take 1 capsule (40 mg total) by mouth daily.   pravastatin (PRAVACHOL) 20 MG tablet TAKE 1 TABLET(20 MG) BY MOUTH DAILY   predniSONE (DELTASONE) 20 MG tablet Take two tablets daily for 3 days followed by one tablet daily for 4 days   spironolactone (ALDACTONE) 25 MG tablet Take 0.5 tablets (12.5 mg total) by mouth daily.   warfarin (COUMADIN) 2.5 MG tablet TAKE BY MOUTH AS DIRECTED AFTER ANTI-COAG CLINIC   No facility-administered encounter medications on file as of 11/12/2022.     Contacted patient to discuss COPD disease state 11/17/22  long history of smoking   Previously declined Chantix  Current COPD regimen:  Breztri 160-9-4.8 mcg/act 2 puff BID  Albuterol HFA Duoneb PRN  Any recent hospitalizations or ED visits since last visit with CPP? No  denies COPD symptoms, including Increased shortness of breath , Rescue medicine is not helping, Shortness of breath at rest, Symptoms worse with exercise, Symptoms worse at night, and Wheezing   What recent interventions/DTPs have been made by any provider to improve breathing since last visit: Patient enrolled in [facility] assistance and using medications without complications.Patient appreciates our help with the assistance   Have you had exacerbation/flare-up since last visit? No  What do you do when you are short of breath?  Rescue medication and Rest  Current tobacco use?  1 pack/day Interested in cessation? No  Previously declined Chantix  Respiratory Devices/Equipment Do you have a nebulizer? Yes Do you use a Peak Flow Meter? No Do you use a maintenance inhaler? Yes How often do you forget to use your daily inhaler?  never Do you use a rescue inhaler? Yes How often do you use your rescue inhaler?  1-2x per week Do you use a spacer with your inhaler? No  Adherence Review: Does the patient have >5 day gap between last estimated fill date for maintenance inhaler  medications? No  Wende Crease  Albuterol [facility]    11/.24/23 25ds   CCM appointment on 01/12/23  Care Gaps: Annual wellness visit in last year? Yes Most Recent BP reading: 118/72  11/11/22  If Diabetic: Most recent A1C reading: 8.3 Last eye exam / retinopathy screening:2021 Last diabetic foot exam: 2021  Summary of recommendations from last [place] visit (Date:06/30/22) Summary: CCM F/U visit  -Reviewed medications; pt endorses compliance -COPD: pt reports Judithann Sauger is expensive; he qualifies for PAP, enrolled in [employer]Me    Recommendations/Changes made from today's visit: -Coordinate with pulmonology for [facility] Rx to AZ[employer]Me ([employer])  [facility] Medication Name  Last Fill Date  Day supply  Metformin '500mg'$   10/11/22   90 Pravastatin  '20MG'$   10/28/22  [place], CPP notified  Avel Sensor, [place]  [phone]

## 2022-11-20 ENCOUNTER — Encounter: Payer: Self-pay | Admitting: Family Medicine

## 2022-12-03 ENCOUNTER — Ambulatory Visit: Payer: Medicare Other | Attending: Cardiovascular Disease

## 2022-12-03 DIAGNOSIS — Z5181 Encounter for therapeutic drug level monitoring: Secondary | ICD-10-CM

## 2022-12-03 DIAGNOSIS — I4891 Unspecified atrial fibrillation: Secondary | ICD-10-CM

## 2022-12-03 LAB — POCT INR: INR: 2.9 (ref 2.0–3.0)

## 2022-12-03 MED ORDER — WARFARIN SODIUM 2.5 MG PO TABS: ORAL_TABLET | ORAL | 1 refills | Status: DC

## 2022-12-03 NOTE — Patient Instructions (Signed)
-   Continue 1.5 tablets every day - Recheck INR in 6 weeks

## 2022-12-09 ENCOUNTER — Ambulatory Visit (INDEPENDENT_AMBULATORY_CARE_PROVIDER_SITE_OTHER): Payer: Medicare Other | Admitting: Family Medicine

## 2022-12-09 ENCOUNTER — Encounter: Payer: Self-pay | Admitting: Family Medicine

## 2022-12-09 VITALS — BP 130/80 | HR 61 | Temp 97.2°F | Ht 72.0 in | Wt 214.5 lb

## 2022-12-09 DIAGNOSIS — G894 Chronic pain syndrome: Secondary | ICD-10-CM | POA: Diagnosis not present

## 2022-12-09 DIAGNOSIS — J3489 Other specified disorders of nose and nasal sinuses: Secondary | ICD-10-CM

## 2022-12-09 DIAGNOSIS — R5383 Other fatigue: Secondary | ICD-10-CM

## 2022-12-09 DIAGNOSIS — G4733 Obstructive sleep apnea (adult) (pediatric): Secondary | ICD-10-CM | POA: Diagnosis not present

## 2022-12-09 DIAGNOSIS — G8929 Other chronic pain: Secondary | ICD-10-CM | POA: Diagnosis not present

## 2022-12-09 DIAGNOSIS — R0781 Pleurodynia: Secondary | ICD-10-CM | POA: Diagnosis not present

## 2022-12-09 DIAGNOSIS — R5381 Other malaise: Secondary | ICD-10-CM | POA: Insufficient documentation

## 2022-12-09 DIAGNOSIS — E1140 Type 2 diabetes mellitus with diabetic neuropathy, unspecified: Secondary | ICD-10-CM

## 2022-12-09 DIAGNOSIS — J449 Chronic obstructive pulmonary disease, unspecified: Secondary | ICD-10-CM | POA: Diagnosis not present

## 2022-12-09 DIAGNOSIS — Z720 Tobacco use: Secondary | ICD-10-CM

## 2022-12-09 MED ORDER — HYDROCODONE-ACETAMINOPHEN 10-325 MG PO TABS: 1.0000 | ORAL_TABLET | Freq: Four times a day (QID) | ORAL | 0 refills | Status: DC | PRN

## 2022-12-09 MED ORDER — PREDNISONE 20 MG PO TABS: 40.0000 mg | ORAL_TABLET | Freq: Every day | ORAL | 0 refills | Status: DC

## 2022-12-09 MED ORDER — LORATADINE 10 MG PO TABS: 10.0000 mg | ORAL_TABLET | Freq: Every day | ORAL | Status: AC

## 2022-12-09 MED ORDER — IPRATROPIUM-ALBUTEROL 0.5-2.5 (3) MG/3ML IN SOLN: 3.0000 mL | Freq: Four times a day (QID) | RESPIRATORY_TRACT | 6 refills | Status: DC | PRN

## 2022-12-09 NOTE — Assessment & Plan Note (Signed)
Left sided chest wall pain most consistent with rib strain from cough.

## 2022-12-09 NOTE — Assessment & Plan Note (Signed)
Anticipate related to rhinitis - discussed daily antihistamine use.

## 2022-12-09 NOTE — Progress Notes (Signed)
Patient ID: [name], male    DOB: 06/16/1946, 77 y.o.   MRN: [id number]  This visit was conducted in person.  BP 130/80 (BP Location: Left Arm, Patient Position: Sitting)   Pulse 61   Temp (!) 97.2 F (36.2 C) (Skin)   Ht 6' (1.829 m)   Wt 214 lb 8 oz (97.3 kg)   SpO2 97%   BMI 29.09 kg/m    CC: cough, fatigue  Subjective:   HPI: [name] is a 77 y.o. male presenting on 12/09/2022 for Fatigue (Sleeping a lot,runny, cough, wife is present)   Requests hydrocodone refilled.   Wakes up at 7am, eats breakfast, and then feels fatigued - goes back to bed to lay down. Doesn't have to sleep but feels better after he lays down for 1 hour. Ongoing for months.   Increased cough with phlegm in evenings recently for several months, as well as significant rhinorrhea. He takes antihistamine daily. No significant dyspnea, occasional L sided chest wall pain. No myalgias.   He uses supplemental oxygen via portable oxygen concentrator at night.  Known OSA, but doesn't use CPAP. Last sleep study 2013. He wouldn't want to use CPAP machine.   COVID test negative at home recently.   Known COPD on albuterol inhaler at night. He's not using Breztri due to concern over side effects of medications that he's saw on tv.  Continues smoking 1 ppd.   Diabetic on metformin XR 555m daily. Lab Results  Component Value Date   HGBA1C 8.3 (H) 09/15/2022    Seen last month with R hand swelling - treated with keflex and prednisone course with benefit. At that time CBC, urate, ESR/CRP overall ok. Still notes some R 2nd PIP redness/swelling present.      Relevant past medical, surgical, family and social history reviewed and updated as indicated. Interim medical history since our last visit reviewed. Allergies and medications reviewed and updated. Outpatient Medications Prior to Visit  Medication Sig Dispense Refill   albuterol (VENTOLIN HFA) 108 (90 Base) MCG/ACT inhaler Inhale 2 puffs into the  lungs every 6 (six) hours as needed for wheezing or shortness of breath. 8.5 g 3   amiodarone (PACERONE) 200 MG tablet Take 200 mg by mouth as needed. Take for fast heart rate>100     amLODipine (NORVASC) 5 MG tablet Take 5 mg by mouth as needed. For SBP > 160     aspirin 81 MG EC tablet Take 1 tablet (81 mg total) by mouth daily. Swallow whole.     Budeson-Glycopyrrol-Formoterol (BREZTRI AEROSPHERE) 160-9-4.8 MCG/ACT AERO Inhale 2 puffs into the lungs in the morning and at bedtime. 3 each 3   Cholecalciferol (VITAMIN D) 50 MCG (2000 UT) CAPS Take 1 capsule (2,000 Units total) by mouth daily. 30 capsule    Coenzyme Q10 (COQ-10 PO) Take 1 tablet by mouth daily.     Cyanocobalamin (B-12) 1000 MCG SUBL Place 1 tablet under the tongue every Monday, Wednesday, and Friday. 30 tablet    diclofenac Sodium (VOLTAREN) 1 % GEL Apply 4 g topically 3 (three) times daily. (Patient taking differently: Apply 4 g topically 3 (three) times daily. As needed) 100 g 3   diltiazem (CARDIZEM) 30 MG tablet Take 1 tablet (30 mg total) by mouth daily as needed (fast heart rate). 30 tablet 5   diphenoxylate-atropine (LOMOTIL) 2.5-0.025 MG tablet Take 1 tablet by mouth 2 (two) times daily as needed. 20 tablet 0   Ferrous Sulfate (IRON) 325 (  65 Fe) MG TABS Take by mouth daily.     fish oil-omega-3 fatty acids 1000 MG capsule Take 2 g by mouth daily.     furosemide (LASIX) 20 MG tablet TAKE 1 TABLET BY MOUTH EVERY DAY AS NEEDED FOR FLUID RETENTION 90 tablet 0   gabapentin (NEURONTIN) 600 MG tablet Take 1 tablet (600 mg total) by mouth 2 (two) times daily. 180 tablet 1   metFORMIN (GLUCOPHAGE-XR) 500 MG 24 hr tablet Take 1 tablet (500 mg total) by mouth daily with breakfast. 90 tablet 1   methocarbamol (ROBAXIN) 500 MG tablet Take 1 tablet (500 mg total) by mouth 2 (two) times daily as needed for muscle spasms (sedation precautions). 180 tablet 0   metoprolol tartrate (LOPRESSOR) 25 MG tablet Take 1 tablet (25 mg total) by mouth  2 (two) times daily. Take for fast heart rate>100 180 tablet 2   midodrine (PROAMATINE) 10 MG tablet Take 1 tablet (10 mg total) by mouth 3 (three) times daily as needed. 90 tablet 1   omeprazole (PRILOSEC) 40 MG capsule Take 1 capsule (40 mg total) by mouth daily. 90 capsule 3   pravastatin (PRAVACHOL) 20 MG tablet TAKE 1 TABLET(20 MG) BY MOUTH DAILY 90 tablet 3   spironolactone (ALDACTONE) 25 MG tablet Take 0.5 tablets (12.5 mg total) by mouth daily. 45 tablet 1   warfarin (COUMADIN) 2.5 MG tablet TAKE BY MOUTH AS DIRECTED AFTER ANTI-COAG CLINIC 135 tablet 1   ipratropium-albuterol (DUONEB) 0.5-2.5 (3) MG/3ML SOLN Take 3 mLs by nebulization every 6 (six) hours as needed. 360 mL 11   nitroGLYCERIN (NITROLINGUAL) 0.4 MG/SPRAY spray USE 1 SPRAY AS DIRECTED EVERY 5 MINUTES AS NEEDED (Patient not taking: Reported on 12/09/2022) 4.9 g 1   cephALEXin (KEFLEX) 500 MG capsule Take 1 capsule (500 mg total) by mouth 3 (three) times daily. (Patient not taking: Reported on 12/09/2022) 21 capsule 0   HYDROcodone-acetaminophen (NORCO) 10-325 MG tablet Take 1 tablet by mouth every 6 (six) hours as needed for moderate pain or severe pain. 120 tablet 0   predniSONE (DELTASONE) 20 MG tablet Take two tablets daily for 3 days followed by one tablet daily for 4 days (Patient not taking: Reported on 12/09/2022) 10 tablet 0   No facility-administered medications prior to visit.     Per HPI unless specifically indicated in ROS section below Review of Systems  Objective:  BP 130/80 (BP Location: Left Arm, Patient Position: Sitting)   Pulse 61   Temp (!) 97.2 F (36.2 C) (Skin)   Ht 6' (1.829 m)   Wt 214 lb 8 oz (97.3 kg)   SpO2 97%   BMI 29.09 kg/m   Wt Readings from Last 3 Encounters:  12/09/22 214 lb 8 oz (97.3 kg)  11/11/22 217 lb (98.4 kg)  11/04/22 206 lb (93.4 kg)      Physical Exam Vitals and nursing note reviewed.  Constitutional:      Appearance: Normal appearance. He is not ill-appearing.      Comments: Sitting in wheelchair  HENT:     Head: Normocephalic and atraumatic.  Cardiovascular:     Rate and Rhythm: Normal rate and regular rhythm.     Pulses: Normal pulses.     Heart sounds: Normal heart sounds. No murmur heard. Pulmonary:     Effort: Pulmonary effort is normal. No respiratory distress.     Breath sounds: Wheezing (faint diffusely) present. No rhonchi or rales.     Comments: Slight crackles at RLL Musculoskeletal:  Comments: Erythema/swelling to R 2nd PIP, ROM preserved   Neurological:     Mental Status: He is alert.  Psychiatric:        Mood and Affect: Mood normal.        Behavior: Behavior normal.       Results for orders placed or performed in visit on 12/03/22  POCT INR  Result Value Ref Range   INR 2.9 2.0 - 3.0   POC INR     *Note: Due to a large number of results and/or encounters for the requested time period, some results have not been displayed. A complete set of results can be found in Results Review.    Assessment & Plan:   Problem List Items Addressed This Visit     Chronic pain syndrome (Chronic)   Relevant Medications   predniSONE (DELTASONE) 20 MG tablet   HYDROcodone-acetaminophen (NORCO) 10-325 MG tablet   Encounter for chronic pain management (Chronic)    [place] CSRS reviewed. Hydrocodone refilled per pt request.       Tobacco abuse    Continues 1 ppd smoking - continue to encourage cessation.       COPD mixed type ([place]) - Primary    Discussed controller vs rescue inhaler use.  Will refill duonebs to use PRN He's not been using Breztri due to concerns over side effects - encouraged he restart this, slow titration, start at 1 puff nightly and increase to goal 2 puffs BID. Update with effect on am fatigue.       Relevant Medications   predniSONE (DELTASONE) 20 MG tablet   ipratropium-albuterol (DUONEB) 0.5-2.5 (3) MG/3ML SOLN   loratadine (CLARITIN) 10 MG tablet   Type 2 diabetes, controlled, with neuropathy (HCC)     Continues once daily metformin.       OSA (obstructive sleep apnea)    Anticipate this contributes to am fatigue.  Reviewed risks of untreated sleep apnea including heart attack/stroke. Offered rpt sleep study (last done ~2013). He is not interested on CPAP machine use.  He will continue nocturnal oxygen use.       Rib pain    Left sided chest wall pain most consistent with rib strain from cough.       Fatigue    Anticipate multifactorial cause of am fatigue - including untreated OSA, CHF, suboptimally treated COPD. See above.       Rhinorrhea    Anticipate related to rhinitis - discussed daily antihistamine use.         Meds ordered this encounter  Medications   predniSONE (DELTASONE) 20 MG tablet    Sig: Take 2 tablets (40 mg total) by mouth daily with breakfast.    Dispense:  8 tablet    Refill:  0   HYDROcodone-acetaminophen (NORCO) 10-325 MG tablet    Sig: Take 1 tablet by mouth every 6 (six) hours as needed for moderate pain or severe pain.    Dispense:  120 tablet    Refill:  0   ipratropium-albuterol (DUONEB) 0.5-2.5 (3) MG/3ML SOLN    Sig: Take 3 mLs by nebulization every 6 (six) hours as needed.    Dispense:  360 mL    Refill:  6    Dx: J44.9   loratadine (CLARITIN) 10 MG tablet    Sig: Take 1 tablet (10 mg total) by mouth daily.   No orders of the defined types were placed in this encounter.   Patient Instructions  Restart breztri.  Continue daily allergy medicine.  Use nasal saline throughout the day.  Take prednisone burst.  Cut down on smoking.  Let us know how you do with this.   Follow up plan: Return if symptoms worsen or fail to improve.  Ria Bush, MD

## 2022-12-09 NOTE — Assessment & Plan Note (Addendum)
Discussed controller vs rescue inhaler use.  Will refill duonebs to use PRN He's not been using Breztri due to concerns over side effects - encouraged he restart this, slow titration, start at 1 puff nightly and increase to goal 2 puffs BID. Update with effect on am fatigue.

## 2022-12-09 NOTE — Assessment & Plan Note (Signed)
Continues 1 ppd smoking - continue to encourage cessation.

## 2022-12-09 NOTE — Assessment & Plan Note (Signed)
[place] CSRS reviewed. Hydrocodone refilled per pt request.

## 2022-12-09 NOTE — Patient Instructions (Addendum)
Restart breztri.  Continue daily allergy medicine. Use nasal saline throughout the day.  Take prednisone burst.  Cut down on smoking.  Let us know how you do with this.

## 2022-12-09 NOTE — Assessment & Plan Note (Signed)
Continues once daily metformin.

## 2022-12-09 NOTE — Assessment & Plan Note (Addendum)
Anticipate this contributes to am fatigue.  Reviewed risks of untreated sleep apnea including heart attack/stroke. Offered rpt sleep study (last done ~2013). He is not interested on CPAP machine use.  He will continue nocturnal oxygen use.

## 2022-12-09 NOTE — Assessment & Plan Note (Signed)
Anticipate multifactorial cause of am fatigue - including untreated OSA, CHF, suboptimally treated COPD. See above.

## 2023-01-07 ENCOUNTER — Telehealth: Payer: Self-pay

## 2023-01-07 NOTE — Progress Notes (Addendum)
Care Management [facility] Coordination Services Pharmacy Team  Reason for Encounter: Appointment Reminder  Contacted patient to confirm telephone appointment with Charlene Brooke, PharmD  PharmD,  Spoke with caregiver on 01/07/2023  Wife [name] requested to reschedule to late afternoon so 01/13/23 is new appointment  Do you have any problems getting your medications? No   What is your top health concern you would like to discuss at your upcoming visit?  [name] has a cough  Have you seen any other providers since your last visit with PCP? No   Chart review:  Recent office visits:  12/09/22-Javier Gutierrez,MD(PCP)- fatigue,He's not using Breztri due to concern over side effects of medications that he's saw on tv.  Smoking 1ppd,discussed controller vs rescue inhaler,refill duonebs,encouraged restart of breztri.not interested in CPAP,nocturnal oxygen,start prednisone '20mg'$   2 tablets daily.   Hospital visits:  None in previous 6 months   Star Rating Drugs:  Medication:  Last Fill: Day Supply Metformin '500mg'$  12/11/21  90 Patient has supply on hand    Care Gaps: Annual wellness visit in last year? Yes  If Diabetic: Last eye exam / retinopathy screening:2021 Last diabetic foot exam:2021  Charlene Brooke, PharmD notified  [name], [place] Assistant [phone]

## 2023-01-12 ENCOUNTER — Encounter: Payer: Medicare Other | Admitting: Pharmacist

## 2023-01-13 ENCOUNTER — Ambulatory Visit: Payer: Medicare Other | Admitting: Pharmacist

## 2023-01-13 NOTE — Patient Instructions (Signed)
Visit Information  Phone number for Pharmacist: [phone]  Thank you for meeting with me to discuss your medications! Below is a summary of what we talked about during the visit:   Recommendations/Changes made from today's visit: -No med changes -Re-check A1c at upcoming appt; can continue metformin titration if A1c remains above goal -Advised to use albuterol first then Duoneb if still needed for shortness of breath  Follow up plan: -Health Concierge will call patient 1 months -Pharmacist follow up televisit scheduled for 2 months -PCP appt 01/16/23   Charlene Brooke, PharmD, BCACP Clinical Pharmacist [facility] [phone]

## 2023-01-13 NOTE — Progress Notes (Signed)
Care Management & Coordination Services Pharmacy Note  01/13/2023 Name:  [name] MRN:  [id number] DOB:  31-Aug-1946  Summary: -DM: A1c 8.3% (09/2022), worsened from 6.1% previously; pt reports compliance with metformin once daily and has actually been taking 500 mg BID for the last week due to elevated BG in evening (fasting BG 150-180, evening BG 200-250) -COPD: pt reports chronic shortness of breath, increased phlegm for last few months - saw PCP 12/09/22 for similar issues. He is now taking Breztri BID. Discussed when to use rescue inhaler/nebulizer.  Recommendations/Changes made from today's visit: -No med changes -Re-check A1c at upcoming appt; can continue metformin titration if A1c remains above goal -Advised to use albuterol first then Duoneb if still needed for shortness of breath  Follow up plan: -Health Concierge will call patient 1 months for COPD update -Pharmacist follow up televisit scheduled for 2 months -PCP appt 01/16/23    SUBJECTIVE: [name] is an 77 y.o. year old male who is a primary patient of Ria Bush, MD.  The care coordination team was consulted for assistance with disease management and care coordination needs.    Engaged with patient by telephone for follow up visit.  Recent office visits: 12/09/22 Dr Danise Mina OV: f/u - COPD. Increased cough for months, fatigue. Not using CPAP. Not using Breztri d/t concern over side effects. Advised to restart Breztri. Rx'd prednisone burst, refilled duoneb, norco, loratdine.  11/11/22 Dr Danise Mina OV: f/u- R hand swelling. Rx keflex, prednisone.  09/15/22 Dr Danise Mina OV: annual - schedule eye exam. Order ABI for foot pain. B12 too high - drop OTC to MWF. A1c 8.3 - start metformin 500 mg daily. TRIG too high.   Recent consult visits: 08/06/22 Dr Rockey Situ (Cardiology): f/u- Hold amiodarone, can take PRN for HR> 100. Take metoprolol 25 mg BID. Take diltiazem 30 mg PRN for HR >100 or elevated BP.  Hospital  visits: 12/26/21-12/30/21 Admission ([facility]): sepsis d/t abscess. Pt left AMA - fall risk.     OBJECTIVE:  Lab Results  Component Value Date   CREATININE 1.08 09/15/2022   BUN 15 09/15/2022   GFR 65.79 09/15/2022   GFRNONAA 55 (L) 11/03/2021   GFRAA 88 09/07/2017   NA 139 09/15/2022   K 4.8 09/15/2022   CALCIUM 8.6 09/15/2022   CO2 30 09/15/2022   GLUCOSE 180 (H) 09/15/2022    Lab Results  Component Value Date/Time   HGBA1C 8.3 (H) 09/15/2022 03:16 PM   HGBA1C 6.1 12/11/2021 11:53 AM   GFR 65.79 09/15/2022 03:16 PM   GFR 77.08 01/30/2022 11:32 AM   MICROALBUR 4.9 (H) 09/10/2021 01:22 PM   MICROALBUR 3.3 (H) 04/04/2021 11:24 AM    Last diabetic Eye exam:  Lab Results  Component Value Date/Time   HMDIABEYEEXA No Retinopathy 12/30/2019 12:00 AM    Last diabetic Foot exam: No results found for: "HMDIABFOOTEX"   Lab Results  Component Value Date   CHOL 117 09/15/2022   HDL 28.10 (L) 09/15/2022   LDLCALC 69 09/10/2021   LDLDIRECT 74.0 09/15/2022   TRIG 336.0 (H) 09/15/2022   CHOLHDL 4 09/15/2022       Latest Ref Rng & Units 09/15/2022    3:16 PM 01/30/2022   11:32 AM 12/11/2021   11:53 AM  Hepatic Function  Total Protein 6.0 - 8.3 g/dL 6.2  6.5  6.1   Albumin 3.5 - 5.2 g/dL 3.6  3.5  3.2   AST 0 - 37 U/L 16  16  11  ALT 0 - 53 U/L '15  11  8   '$ Alk Phosphatase 39 - 117 U/L 84  73  69   Total Bilirubin 0.2 - 1.2 mg/dL 0.6  0.5  0.7     Lab Results  Component Value Date/Time   TSH 3.24 12/11/2021 11:53 AM   TSH 2.97 09/10/2021 01:22 PM   FREET4 1.07 10/09/2017 10:30 AM       Latest Ref Rng & Units 11/11/2022   12:49 PM 09/15/2022    3:16 PM 01/30/2022   11:32 AM  CBC  WBC 4.0 - 10.5 K/uL 6.6  8.2  8.7   Hemoglobin 13.0 - 17.0 g/dL 14.0  14.2  11.8   Hematocrit 39.0 - 52.0 % 41.1  42.0  37.0   Platelets 150.0 - 400.0 K/uL 193.0  196.0  266.0     Lab Results  Component Value Date/Time   VD25OH 36.60 09/15/2022 03:16 PM   VD25OH 30.74 09/10/2021 01:22 PM    VITAMINB12 1,270 (H) 09/15/2022 03:16 PM   VITAMINB12 478 12/25/2021 12:08 PM    Clinical ASCVD: Yes  The ASCVD Risk score (Arnett DK, et al., 2019) failed to calculate for the following reasons:   The valid total cholesterol range is 130 to 320 mg/dL       09/15/2022    2:43 PM 09/10/2021   12:13 PM 05/02/2020    4:16 PM  Depression screen PHQ 2/9  Decreased Interest 0 0 0  Down, Depressed, Hopeless 0 1 0  PHQ - 2 Score 0 1 0     Social History   Tobacco Use  Smoking Status Every Day   Packs/day: 2.50   Years: 65.00   Total pack years: 162.50   Types: Cigarettes   Last attempt to quit: 10/28/2021   Years since quitting: 1.2  Smokeless Tobacco Never  Tobacco Comments   1ppd 08/20/2021   BP Readings from Last 3 Encounters:  12/09/22 130/80  11/11/22 118/72  09/15/22 110/78   Pulse Readings from Last 3 Encounters:  12/09/22 61  11/11/22 (!) 58  09/15/22 62   Wt Readings from Last 3 Encounters:  12/09/22 214 lb 8 oz (97.3 kg)  11/11/22 217 lb (98.4 kg)  11/04/22 206 lb (93.4 kg)   BMI Readings from Last 3 Encounters:  12/09/22 29.09 kg/m  11/11/22 29.43 kg/m  11/04/22 27.94 kg/m    Allergies  Allergen Reactions   Oxycodone Hcl Shortness Of Breath   Glipizide Diarrhea   Metformin And Related Diarrhea    Trouble tolerating even extended release metformin   Sitagliptin Diarrhea   Varenicline Tartrate Other (See Comments)    REACTION: hallucinations, but on retrial did well   Wellbutrin [Bupropion Hcl] Other (See Comments)    Hallucinations   Zocor [Simvastatin] Other (See Comments)    Muscle pain    Medications Reviewed Today     Reviewed by Charlton Haws, [facility] (Pharmacist) on 01/13/23 at [place] List Status: <None>   Medication Order Taking? Sig Documenting Provider Last Dose Status Informant  albuterol (VENTOLIN HFA) 108 (90 Base) MCG/ACT inhaler [id number] Yes Inhale 2 puffs into the lungs every 6 (six) hours as needed for wheezing or  shortness of breath. Ria Bush, MD Taking Active   amiodarone (PACERONE) 200 MG tablet [id number] Yes Take 200 mg by mouth as needed. Take for fast heart rate>100 Minna Merritts, MD Taking Active   amLODipine (NORVASC) 5 MG tablet [id number] Yes Take 5 mg by mouth daily.  [provider] Taking Active   aspirin 81 MG EC tablet [id number] Yes Take 1 tablet (81 mg total) by mouth daily. Swallow whole. Ria Bush, MD Taking Active   Budeson-Glycopyrrol-Formoterol [facility] AEROSPHERE) 160-9-4.8 MCG/ACT Hollie Salk [id number] Yes Inhale 2 puffs into the lungs in the morning and at bedtime. Ria Bush, MD Taking Active            Med Note Charlton Haws   Tue Jan 13, 2023  4:34 PM) AZ&Me PAP - [facility]  Cholecalciferol (VITAMIN D) 50 MCG (2000 UT) CAPS [id number] Yes Take 1 capsule (2,000 Units total) by mouth daily. Ria Bush, MD Taking Active   Coenzyme Q10 (COQ-10 PO) [id number] Yes Take 1 tablet by mouth daily. [provider] Taking Active   Cyanocobalamin (B-12) 1000 MCG SUBL [id number] Yes Place 1 tablet under the tongue every Monday, Wednesday, and Friday. Ria Bush, MD Taking Active   diclofenac Sodium (VOLTAREN) 1 % GEL [id number] Yes Apply 4 g topically 3 (three) times daily.  Patient taking differently: Apply 4 g topically 3 (three) times daily. As needed   Ria Bush, MD Taking Active   diltiazem (CARDIZEM) 30 MG tablet [id number] Yes Take 1 tablet (30 mg total) by mouth daily as needed (fast heart rate). Minna Merritts, MD Taking Active   diphenoxylate-atropine (LOMOTIL) 2.5-0.025 MG tablet [id number] Yes Take 1 tablet by mouth 2 (two) times daily as needed. Ria Bush, MD Taking Active   Ferrous Sulfate (IRON) 325 (65 Fe) MG TABS [id number] Yes Take by mouth daily. [provider] Taking Active Self  fish oil-omega-3 fatty acids 1000 MG capsule [id number] Yes Take 2 g by mouth daily. [provider]  Taking Active Spouse/Significant Other  furosemide (LASIX) 20 MG tablet [id number] Yes TAKE 1 TABLET BY MOUTH EVERY DAY AS NEEDED FOR FLUID RETENTION Gollan, Kathlene November, MD Taking Active   gabapentin (NEURONTIN) 600 MG tablet [id number] Yes Take 1 tablet (600 mg total) by mouth 2 (two) times daily. Ria Bush, MD Taking Active   HYDROcodone-acetaminophen [facility]) 10-325 MG tablet [id number]  Take 1 tablet by mouth every 6 (six) hours as needed for moderate pain or severe pain. Ria Bush, MD  Expired 01/08/23 2359   ipratropium-albuterol (DUONEB) 0.5-2.5 (3) MG/3ML SOLN [id number] Yes Take 3 mLs by nebulization every 6 (six) hours as needed. Ria Bush, MD Taking Active   loratadine (CLARITIN) 10 MG tablet [id number] Yes Take 1 tablet (10 mg total) by mouth daily. Ria Bush, MD Taking Active   metFORMIN (GLUCOPHAGE-XR) 500 MG 24 hr tablet [id number] Yes Take 1 tablet (500 mg total) by mouth daily with breakfast. Ria Bush, MD Taking Active   methocarbamol (ROBAXIN) 500 MG tablet [id number] Yes Take 1 tablet (500 mg total) by mouth 2 (two) times daily as needed for muscle spasms (sedation precautions). Ria Bush, MD Taking Active   metoprolol tartrate (LOPRESSOR) 25 MG tablet [id number] Yes Take 1 tablet (25 mg total) by mouth 2 (two) times daily. Take for fast heart rate>100  Patient taking differently: Take 25 mg by mouth 2 (two) times daily.   Minna Merritts, MD Taking Active   midodrine (PROAMATINE) 10 MG tablet [id number] No Take 1 tablet (10 mg total) by mouth 3 (three) times daily as needed.  Patient not taking: Reported on 01/13/2023   Minna Merritts, MD Not Taking Active   nitroGLYCERIN (NITROLINGUAL) 0.4 MG/SPRAY spray [id number] Yes USE 1 SPRAY AS DIRECTED EVERY 5 MINUTES AS NEEDED Ria Bush, MD Taking Active  omeprazole (PRILOSEC) 40 MG capsule [id number] Yes Take 1 capsule (40 mg total) by mouth daily. Ria Bush, MD Taking Active    pravastatin (PRAVACHOL) 20 MG tablet [id number] Yes TAKE 1 TABLET(20 MG) BY MOUTH DAILY Ria Bush, MD Taking Active   spironolactone (ALDACTONE) 25 MG tablet [id number] Yes Take 0.5 tablets (12.5 mg total) by mouth daily. Ria Bush, MD Taking Active   warfarin (COUMADIN) 2.5 MG tablet [id number] Yes TAKE BY MOUTH AS DIRECTED AFTER ANTI-COAG CLINIC Gollan, Kathlene November, MD Taking Active   Med List Note Ignatius Specking, RN 01/04/18 1000): 09/07/17 MR 02/03/18  Start hydrcodone 10-325            SDOH:  (Social Determinants of Health) assessments and interventions performed: No SDOH Interventions    Flowsheet Row Chronic Care Management from 06/24/2021 in [facility] at [place] from 04/18/2019 in [facility] at [place] from 12/14/2017 in [facility] at [place]  SDOH Interventions     Depression Interventions/Treatment  -- SAY3-0 Score <4 Follow-up Not Indicated --  [referral to PCP]  Financial Strain Interventions Other (Comment)  [Working on PAP with pulmonology] -- --       Medication Assistance:  Breztri - AZ&Me PAP approved 2024  Medication Access: Within the past 30 days, how often has patient missed a dose of medication? 0 Is a pillbox or other method used to improve adherence? Yes  Factors that may affect medication adherence? lack of understanding of disease management Are meds synced by current pharmacy? No  Are meds delivered by current pharmacy? No  Does patient experience delays in picking up medications due to transportation concerns? No   [facility] Services Reviewed: Is patient disadvantaged to use [facility]?: Yes  Current Rx insurance plan: [facility] Name and location of Current pharmacy:  [facility] [place], [address] [place] [address] Phone:  [phone] Fax: [phone]  [facility] services reviewed with patient today?: No  Patient requests to transfer care to [facility] Pharmacy?: No  Reason patient declined to change pharmacies: Disadvantaged due to insurance/mail order  Compliance/Adherence/Medication fill history: Care Gaps: Eye exam (due 12/2020) Foot exam (due 01/2021) UACR (due 09/2022) Lung ca screening (due 01/2019)  Star-Rating Drugs: Metformin - PDC 0%; LF 12/11/21 x 90 ds Pravastatin - PDC 96%   ASSESSMENT / PLAN  Hypertension / Heart Failure (BP goal <140/90) -Controlled - per home readings; he reports he has not needed PRN medications lately (diltiazem, midodrine) -Home BP readings: 118/80, 67 -Home weight: none available -Denies hypotensive/hypertensive symptoms -Last ejection fraction: >55% (Date: 11/2021) -HF type: HFpEF (EF > 50%); NYHA Class: not on file -Considerations: hx orthostatic hypotension (sometimes on midodrine); Tobacco abuse, not interested in quitting -Current treatment: Amlodipine 5 mg daily - Appropriate, Effective, Safe, Accessible Furosemide 20 mg daily PRN -Appropriate, Effective, Safe, Accessible Metoprolol tartrate 25 mg BID -Appropriate, Effective, Safe, Accessible Spironolactone 25 mg - 1/2 tab daily -Appropriate, Effective, Safe, Accessible Diltiazem 30 mg PRN -Appropriate, Effective, Safe, Accessible Midodrine 10 mg TID PRN -not needed currently -Medications previously tried: none reported -Educated on BP goals and benefits of medications for prevention of heart attack, stroke and kidney damage;  -Counseled to monitor BP at home daily -Recommended to continue current medication  Atrial Fibrillation (Goal: prevent stroke and major bleeding) -Controlled - pt has not had  to use PRN meds (diltiazem, amiodarone) -CHADSVASC: 5; hx sick sinus syndrome -Follows with cardiology (Dr Rockey Situ), pt only has symptoms with tachycardia and has been told to use amiodarone and  diltiazem PRN -Current treatment: Metoprolol tartrate 25 mg BID - Appropriate, Effective, Safe, Accessible Diltiazem 30 mg PRN (HR > 100) - not needed Amiodarone 200 mg PRN (HR > 100) - not needed Warfarin 2.5 mg as directed - Appropriate, Effective, Safe, Accessible -Medications previously tried: n/a -Reviewed utility of PRN amiodarone - with half life of amiodarone ~40 days, using it PRN is not typically done; however pt appears controlled this way and cardiologist is aware he is taking it this way, reasonable to continue as is -Recommended to continue current medication  Hyperlipidemia: (LDL goal < 70) -Controlled - LDL 74 (09/2022) acceptable -Current treatment: Pravastatin 20 mg daily -Appropriate, Effective, Safe, Accessible Aspirin 81 mg daily -Appropriate, Effective, Safe, Accessible Coenezyme Q10 -Appropriate, Effective, Safe, Accessible Omega 3 Fish oil -Appropriate, Effective, Safe, Accessible Nitroglyercin 0.4 mg SL prn -Appropriate, Effective, Safe, Accessible -Medications previously tried: n/a  -Educated on Cholesterol goals; -Recommended to continue current medication  Diabetes (A1c goal <7%) -Uncontrolled - A1c 8.3% (09/2022) worsened from 6.1%; pt has been taking metformin twice a day for the past week due to high BG at night -Pt has had ~30 lb wt gain since 01/2022 -Hx peripheral neuropathy, diabetic foot ulcer (L toe) -Diet: drinks a lot of orange juice -Current home glucose readings - checks a couple times a week fasting glucose: 175  post prandial glucose: 225-250  -Denies hypoglycemic/hyperglycemic symptoms -Current medications: Metformin XR 500 mg daily BID - Appropriate, Query Effective -Medications previously tried: none  -Educated on A1c and blood sugar goals; -Counseled to check feet daily and get yearly eye exams -Recommended to continue current medication; recheck A1c at upcoming OV, plan to continue metformin titration if A1c remains > 7%  COPD,  mixed type (Goal: control symptoms and prevent exacerbations) -Not ideally controlled - pt reports chronic shortness of breath, phlegm worsening over the past few months; he was off Breztri for a period but has been compliant with this over the past month -Tobacco use - 1 PPD -Follows with pulmonary - Dr Merrie Roof (last OV 08/20/21) -Current treatment  Breztri 160-9-4.8 mcg/act 2 puff BID (PAP) - Appropriate, Effective, Safe, Query Accessible Albuterol HFA - Appropriate, Effective, Safe, Accessible Duoneb PRN - Appropriate, Effective, Safe, Accessible Oxygen 2L at night -Medications previously tried: n/a  -Pulmonary function testing: not on file -Exacerbations requiring treatment in last 6 months: 0 -Patient denies consistent use of maintenance inhaler -Frequency of rescue inhaler use: PRN -Counseled on Benefits of consistent maintenance inhaler use; When to use rescue inhaler; Differences between maintenance and rescue inhalers -Discussed if SOB abruptly worsens or wheezing develops, seek urgent/emergency care -Recommend to continue current medication; advised to use albuterol first then Duonebs if still needed  Health Maintenance -Vaccine gaps: Shingrix -Hx prostate cancer (2019 s/p XRT)   Charlene Brooke, PharmD, BCACP Clinical Pharmacist [facility] at [facility] [phone]

## 2023-01-14 ENCOUNTER — Ambulatory Visit: Payer: Medicare Other | Attending: Cardiovascular Disease

## 2023-01-14 DIAGNOSIS — Z5181 Encounter for therapeutic drug level monitoring: Secondary | ICD-10-CM | POA: Insufficient documentation

## 2023-01-14 DIAGNOSIS — I4891 Unspecified atrial fibrillation: Secondary | ICD-10-CM | POA: Insufficient documentation

## 2023-01-14 LAB — POCT INR: INR: 2.5 (ref 2.0–3.0)

## 2023-01-14 NOTE — Patient Instructions (Signed)
-   Continue 1.5 tablets every day - Recheck INR in 6 weeks

## 2023-01-16 ENCOUNTER — Encounter: Payer: Self-pay | Admitting: Family Medicine

## 2023-01-16 ENCOUNTER — Ambulatory Visit (INDEPENDENT_AMBULATORY_CARE_PROVIDER_SITE_OTHER): Payer: Medicare Other | Admitting: Family Medicine

## 2023-01-16 VITALS — BP 118/74 | HR 56 | Temp 97.4°F | Ht 72.0 in | Wt 216.0 lb

## 2023-01-16 DIAGNOSIS — J449 Chronic obstructive pulmonary disease, unspecified: Secondary | ICD-10-CM

## 2023-01-16 DIAGNOSIS — Z7901 Long term (current) use of anticoagulants: Secondary | ICD-10-CM

## 2023-01-16 DIAGNOSIS — E1140 Type 2 diabetes mellitus with diabetic neuropathy, unspecified: Secondary | ICD-10-CM

## 2023-01-16 DIAGNOSIS — G894 Chronic pain syndrome: Secondary | ICD-10-CM

## 2023-01-16 DIAGNOSIS — Z720 Tobacco use: Secondary | ICD-10-CM

## 2023-01-16 DIAGNOSIS — I480 Paroxysmal atrial fibrillation: Secondary | ICD-10-CM

## 2023-01-16 LAB — POCT GLYCOSYLATED HEMOGLOBIN (HGB A1C): Hemoglobin A1C: 9.7 % — AB (ref 4.0–5.6)

## 2023-01-16 MED ORDER — METHOCARBAMOL 500 MG PO TABS: 500.0000 mg | ORAL_TABLET | Freq: Two times a day (BID) | ORAL | 0 refills | Status: DC | PRN

## 2023-01-16 MED ORDER — CEFDINIR 300 MG PO CAPS: 300.0000 mg | ORAL_CAPSULE | Freq: Two times a day (BID) | ORAL | 0 refills | Status: DC

## 2023-01-16 MED ORDER — ALBUTEROL SULFATE HFA 108 (90 BASE) MCG/ACT IN AERS: 2.0000 | INHALATION_SPRAY | Freq: Four times a day (QID) | RESPIRATORY_TRACT | 3 refills | Status: DC | PRN

## 2023-01-16 MED ORDER — HYDROCODONE-ACETAMINOPHEN 10-325 MG PO TABS: 1.0000 | ORAL_TABLET | Freq: Four times a day (QID) | ORAL | 0 refills | Status: DC | PRN

## 2023-01-16 MED ORDER — EMPAGLIFLOZIN 10 MG PO TABS: 10.0000 mg | ORAL_TABLET | Freq: Every day | ORAL | 3 refills | Status: DC

## 2023-01-16 NOTE — Patient Instructions (Addendum)
For breathing - take breztri 2 puffs twice daily. Take Omnicef twice daily for 7 days. Continue controller inhaler Breztri 2 puffs twice daily, duoneb as needed for shortness of breath. Work on cutting down on smoking. For diabetes - continue metformin 55m twice daily. Price out jardiance 139mdaily to take for sugar control.  We will avoid oral steroids at this time.

## 2023-01-16 NOTE — Progress Notes (Unsigned)
Patient ID: [name], male    DOB: [birth year], 77 y.o.   MRN: [id number]  This visit was conducted in person.  BP 118/74   Pulse (!) 56   Temp (!) 97.4 F (36.3 C) (Temporal)   Ht 6' (1.829 m)   Wt 216 lb (98 kg)   SpO2 97%   BMI 29.29 kg/m    CC: 4 mo DM f/u visit  Subjective:   HPI: CHANDLOR Moore is a 77 y.o. male presenting on 01/16/2023 for Medical Management of Chronic Issues (Here for 4 mo f/u. Pt accompanied by wife, [name]. )   COPD - now regularly taking breztri but 5 puffs at once, once daily. Notes several months of increased sputum production in evenings, more purulent (green), dyspnea even at rest. No increased cough. Feels he sleeps well. He is using night time oxygen, not during day. Using portable oxygen concentrator as needed. Uses 2-4 L at a time. Continued smoker 1 ppd.   Afib - takes diltiazem PRN elevated heart rate.   DM - does occ regularly check sugars fasting 150-180, evenings 200-250. Compliant with antihyperglycemic regimen which includes: metformin XR 567m daily - has increased to bid due to recently elevated sugar readings. Tolerating well without diarrhea. Denies low sugars or hypoglycemic symptoms. Denies paresthesias, blurry vision. Last diabetic eye exam DUE - declines DR screen - needs to follow up with Dr BTommy Rainwater Glucometer brand: one touch. Last foot exam: DUE - today. DSME: declined. Lab Results  Component Value Date   HGBA1C 9.7 (A) 01/16/2023   Diabetic Foot Exam - Simple   No data filed    Lab Results  Component Value Date   MICROALBUR 4.9 (H) 09/10/2021         Relevant past medical, surgical, family and social history reviewed and updated as indicated. Interim medical history since our last visit reviewed. Allergies and medications reviewed and updated. Outpatient Medications Prior to Visit  Medication Sig Dispense Refill   albuterol (VENTOLIN HFA) 108 (90 Base) MCG/ACT inhaler Inhale 2 puffs into the lungs every 6 (six)  hours as needed for wheezing or shortness of breath. 8.5 g 3   amiodarone (PACERONE) 200 MG tablet Take 200 mg by mouth as needed. Take for fast heart rate>100     amLODipine (NORVASC) 5 MG tablet Take 5 mg by mouth daily.     aspirin 81 MG EC tablet Take 1 tablet (81 mg total) by mouth daily. Swallow whole.     Budeson-Glycopyrrol-Formoterol (BREZTRI AEROSPHERE) 160-9-4.8 MCG/ACT AERO Inhale 2 puffs into the lungs in the morning and at bedtime. 3 each 3   Cholecalciferol (VITAMIN D) 50 MCG (2000 UT) CAPS Take 1 capsule (2,000 Units total) by mouth daily. 30 capsule    Coenzyme Q10 (COQ-10 PO) Take 1 tablet by mouth daily.     Cyanocobalamin (B-12) 1000 MCG SUBL Place 1 tablet under the tongue every Monday, Wednesday, and Friday. 30 tablet    diclofenac Sodium (VOLTAREN) 1 % GEL Apply 4 g topically 3 (three) times daily. (Patient taking differently: Apply 4 g topically 3 (three) times daily. As needed) 100 g 3   diltiazem (CARDIZEM) 30 MG tablet Take 1 tablet (30 mg total) by mouth daily as needed (fast heart rate). 30 tablet 5   diphenoxylate-atropine (LOMOTIL) 2.5-0.025 MG tablet Take 1 tablet by mouth 2 (two) times daily as needed. 20 tablet 0   Ferrous Sulfate (IRON) 325 (65 Fe) MG TABS Take by mouth daily.  fish oil-omega-3 fatty acids 1000 MG capsule Take 2 g by mouth daily.     furosemide (LASIX) 20 MG tablet TAKE 1 TABLET BY MOUTH EVERY DAY AS NEEDED FOR FLUID RETENTION 90 tablet 0   gabapentin (NEURONTIN) 600 MG tablet Take 1 tablet (600 mg total) by mouth 2 (two) times daily. 180 tablet 1   ipratropium-albuterol (DUONEB) 0.5-2.5 (3) MG/3ML SOLN Take 3 mLs by nebulization every 6 (six) hours as needed. 360 mL 6   loratadine (CLARITIN) 10 MG tablet Take 1 tablet (10 mg total) by mouth daily.     metFORMIN (GLUCOPHAGE-XR) 500 MG 24 hr tablet Take 1 tablet (500 mg total) by mouth daily with breakfast. (Patient taking differently: Take 500 mg by mouth 2 (two) times daily with a meal.) 90  tablet 1   methocarbamol (ROBAXIN) 500 MG tablet Take 1 tablet (500 mg total) by mouth 2 (two) times daily as needed for muscle spasms (sedation precautions). 180 tablet 0   metoprolol tartrate (LOPRESSOR) 25 MG tablet Take 1 tablet (25 mg total) by mouth 2 (two) times daily. Take for fast heart rate>100 (Patient taking differently: Take 25 mg by mouth 2 (two) times daily.) 180 tablet 2   midodrine (PROAMATINE) 10 MG tablet Take 1 tablet (10 mg total) by mouth 3 (three) times daily as needed. 90 tablet 1   nitroGLYCERIN (NITROLINGUAL) 0.4 MG/SPRAY spray USE 1 SPRAY AS DIRECTED EVERY 5 MINUTES AS NEEDED 4.9 g 1   omeprazole (PRILOSEC) 40 MG capsule Take 1 capsule (40 mg total) by mouth daily. 90 capsule 3   pravastatin (PRAVACHOL) 20 MG tablet TAKE 1 TABLET(20 MG) BY MOUTH DAILY 90 tablet 3   spironolactone (ALDACTONE) 25 MG tablet Take 0.5 tablets (12.5 mg total) by mouth daily. 45 tablet 1   warfarin (COUMADIN) 2.5 MG tablet TAKE BY MOUTH AS DIRECTED AFTER ANTI-COAG CLINIC 135 tablet 1   HYDROcodone-acetaminophen (NORCO) 10-325 MG tablet Take 1 tablet by mouth every 6 (six) hours as needed for moderate pain or severe pain. 120 tablet 0   No facility-administered medications prior to visit.     Per HPI unless specifically indicated in ROS section below Review of Systems  Objective:  BP 118/74   Pulse (!) 56   Temp (!) 97.4 F (36.3 C) (Temporal)   Ht 6' (1.829 m)   Wt 216 lb (98 kg)   SpO2 97%   BMI 29.29 kg/m   Wt Readings from Last 3 Encounters:  01/16/23 216 lb (98 kg)  12/09/22 214 lb 8 oz (97.3 kg)  11/11/22 217 lb (98.4 kg)      Physical Exam Vitals and nursing note reviewed.  Constitutional:      Appearance: Normal appearance. He is not ill-appearing.     Comments: Sitting in wheelchair  HENT:     Head: Normocephalic and atraumatic.     Mouth/Throat:     Mouth: Mucous membranes are moist.     Pharynx: Oropharynx is clear. No oropharyngeal exudate or posterior  oropharyngeal erythema.  Eyes:     Extraocular Movements: Extraocular movements intact.     Conjunctiva/sclera: Conjunctivae normal.     Pupils: Pupils are equal, round, and reactive to light.  Cardiovascular:     Rate and Rhythm: Normal rate and regular rhythm.     Pulses: Normal pulses.     Heart sounds: Normal heart sounds. No murmur heard. Pulmonary:     Effort: Pulmonary effort is normal. No respiratory distress.     Breath  sounds: Normal breath sounds. No wheezing, rhonchi or rales.     Comments: Lungs largely clear Musculoskeletal:     Right lower leg: No edema.     Left lower leg: No edema.  Skin:    General: Skin is warm and dry.     Findings: No rash.  Neurological:     Mental Status: He is alert.  Psychiatric:        Mood and Affect: Mood normal.        Behavior: Behavior normal.       Results for orders placed or performed in visit on 01/16/23  POCT glycosylated hemoglobin (Hb A1C)  Result Value Ref Range   Hemoglobin A1C 9.7 (A) 4.0 - 5.6 %   HbA1c POC (<> result, manual entry)     HbA1c, POC (prediabetic range)     HbA1c, POC (controlled diabetic range)     *Note: Due to a large number of results and/or encounters for the requested time period, some results have not been displayed. A complete set of results can be found in Results Review.    Assessment & Plan:   Problem List Items Addressed This Visit     Type 2 diabetes, controlled, with neuropathy ([place]) - Primary   Relevant Orders   POCT glycosylated hemoglobin (Hb A1C) (Completed)     No orders of the defined types were placed in this encounter.   Orders Placed This Encounter  Procedures   POCT glycosylated hemoglobin (Hb A1C)    There are no Patient Instructions on file for this visit.  Follow up plan: No follow-ups on file.  Ria Bush, MD

## 2023-01-17 ENCOUNTER — Encounter: Payer: Self-pay | Admitting: Family Medicine

## 2023-01-17 NOTE — Assessment & Plan Note (Addendum)
Chronic, deteriorated control despite increasing metformin XR to 579m bid.  Previous intolerance to sulfonylurea as well as sitagliptin - caused diarrhea.  Will price out jardiance 15mfor better sugar control. Reviewed watching for recurrent UTI, yeast infection or groin cellulitis. Avoid prednisone - last prescribed 1 month ago with subsequent deteriorated glycemic control.  RTC 3 mo DM f/u visit.

## 2023-01-17 NOTE — Assessment & Plan Note (Signed)
Continued 1ppd smoker, encouraged cessation efforts

## 2023-01-17 NOTE — Assessment & Plan Note (Addendum)
Sounds regular today, continues amiodarone (takes PRN) and coumadin. Has dilt to take PRN elevated pulse. Avoids BB in h/o slow ventricular response.

## 2023-01-17 NOTE — Assessment & Plan Note (Addendum)
Coumadin use limits abx choice. He sees cardiology coumadin clinic.  Lab Results  Component Value Date   INR 2.5 01/14/2023   INR 2.9 12/03/2022   INR 2.6 10/22/2022

## 2023-01-17 NOTE — Assessment & Plan Note (Signed)
Refilled robaxin and hydrocodone for patient today.

## 2023-01-17 NOTE — Assessment & Plan Note (Signed)
Describes months of increased increased sputum production, morning fatigue and increased dyspnea even at rest. Lungs  clear today however given description of symptoms will treat for possible COPD exacerbation with Omnicef antibiotic course. Lungs without wheezing, and in worsening diabetes control - will not prescribe prednisone course.  Reviewed correct administration for Breztri - 2 puffs BID. Discussed duoneb use vs albuterol inhaler use.  Update if not improving with this.

## 2023-01-20 ENCOUNTER — Ambulatory Visit: Payer: Medicare Other | Admitting: Family Medicine

## 2023-02-04 ENCOUNTER — Other Ambulatory Visit: Payer: Self-pay | Admitting: Cardiovascular Disease

## 2023-02-04 ENCOUNTER — Other Ambulatory Visit: Payer: Self-pay | Admitting: Family Medicine

## 2023-02-04 NOTE — Telephone Encounter (Signed)
Pt scheduled on 4/15

## 2023-02-04 NOTE — Telephone Encounter (Signed)
Please schedule F/U appointment for further refills. Thank you!

## 2023-02-05 NOTE — Telephone Encounter (Signed)
Gabapentin Last filled:  11/07/22, #180 Last OV:  01/16/23, 4 mo f/u Next OV:  none

## 2023-02-07 ENCOUNTER — Other Ambulatory Visit: Payer: Self-pay | Admitting: Family Medicine

## 2023-02-10 ENCOUNTER — Telehealth: Payer: Self-pay

## 2023-02-10 NOTE — Progress Notes (Signed)
Care Management & Coordination Services Pharmacy Team  Reason for Encounter: COPD  Contacted patient to discuss COPD disease state. Unsuccessful outreach. Left voicemail for patient to return call.  Current COPD regimen:  Breztri 160-9-4.8 mcg/act 2 puff BID (PAP)  Albuterol HFA  Duoneb PRN  Oxygen 2L at night  Adherence Review: Does the patient have >5 day gap between last estimated fill date for maintenance inhaler medications? No  Albuterol  02/08/23 25ds  [facility] Team  Reason for Encounter: Appointment Reminder  Contacted patient to confirm telephone appointment with Charlene Brooke , PharmD on 03/11/23 at 3:00. Unsuccessful outreach. Left voicemail for patient to return call.  Have you seen any other providers since your last visit with PCP? No  Hospital visits:  None in previous 6 months   Star Rating Drugs:  Medication:  Last Fill: Day Supply Jardiance 10mg  02/18/23 90 Metformin 500mg  09/10/21  Verified with [facility] [place] Pravastatin 20mg  01/23/23 90  Care Gaps: Annual wellness visit in last year? Yes  If Diabetic: Last eye exam / retinopathy screening:2021 Last diabetic foot exam:2021   Medications: Outpatient Encounter Medications as of 02/10/2023  Medication Sig Note   albuterol (VENTOLIN HFA) 108 (90 Base) MCG/ACT inhaler Inhale 2 puffs into the lungs every 6 (six) hours as needed for wheezing or shortness of breath.    amiodarone (PACERONE) 200 MG tablet Take 200 mg by mouth as needed. Take for fast heart rate>100    amLODipine (NORVASC) 5 MG tablet Take 5 mg by mouth daily.    aspirin 81 MG EC tablet Take 1 tablet (81 mg total) by mouth daily. Swallow whole.    Budeson-Glycopyrrol-Formoterol (BREZTRI AEROSPHERE) 160-9-4.8 MCG/ACT AERO Inhale 2 puffs into the lungs in the morning and at bedtime. 01/13/2023: [facility] PAP - [facility]   cefdinir (OMNICEF) 300 MG capsule Take 1 capsule (300 mg total)  by mouth 2 (two) times daily.    Cholecalciferol (VITAMIN D) 50 MCG (2000 UT) CAPS Take 1 capsule (2,000 Units total) by mouth daily.    Coenzyme Q10 (COQ-10 PO) Take 1 tablet by mouth daily.    Cyanocobalamin (B-12) 1000 MCG SUBL Place 1 tablet under the tongue every Monday, Wednesday, and Friday.    diclofenac Sodium (VOLTAREN) 1 % GEL Apply 4 g topically 3 (three) times daily. (Patient taking differently: Apply 4 g topically 3 (three) times daily. As needed)    diltiazem (CARDIZEM) 30 MG tablet TAKE 1 TABLET(30 MG) BY MOUTH DAILY AS NEEDED FOR FAST HEART RATE    diphenoxylate-atropine (LOMOTIL) 2.5-0.025 MG tablet Take 1 tablet by mouth 2 (two) times daily as needed.    empagliflozin (JARDIANCE) 10 MG TABS tablet Take 1 tablet (10 mg total) by mouth daily before breakfast.    Ferrous Sulfate (IRON) 325 (65 Fe) MG TABS Take by mouth daily.    fish oil-omega-3 fatty acids 1000 MG capsule Take 2 g by mouth daily.    furosemide (LASIX) 20 MG tablet TAKE 1 TABLET BY MOUTH EVERY DAY AS NEEDED FOR FLUID RETENTION    gabapentin (NEURONTIN) 600 MG tablet TAKE 1 TABLET(600 MG) BY MOUTH TWICE DAILY    HYDROcodone-acetaminophen (NORCO) 10-325 MG tablet Take 1 tablet by mouth every 6 (six) hours as needed for moderate pain or severe pain.    ipratropium-albuterol (DUONEB) 0.5-2.5 (3) MG/3ML SOLN Take 3 mLs by nebulization every 6 (six) hours as needed.    loratadine (CLARITIN) 10 MG tablet Take 1 tablet (10  mg total) by mouth daily.    metFORMIN (GLUCOPHAGE-XR) 500 MG 24 hr tablet Take 1 tablet (500 mg total) by mouth daily with breakfast. (Patient taking differently: Take 500 mg by mouth 2 (two) times daily with a meal.)    methocarbamol (ROBAXIN) 500 MG tablet Take 1 tablet (500 mg total) by mouth 2 (two) times daily as needed for muscle spasms (sedation precautions).    metoprolol tartrate (LOPRESSOR) 25 MG tablet Take 1 tablet (25 mg total) by mouth 2 (two) times daily. Take for fast heart rate>100  (Patient taking differently: Take 25 mg by mouth 2 (two) times daily.)    midodrine (PROAMATINE) 10 MG tablet Take 1 tablet (10 mg total) by mouth 3 (three) times daily as needed.    nitroGLYCERIN (NITROLINGUAL) 0.4 MG/SPRAY spray USE 1 SPRAY AS DIRECTED EVERY 5 MINUTES AS NEEDED    omeprazole (PRILOSEC) 40 MG capsule TAKE 1 CAPSULE(40 MG) BY MOUTH DAILY    pravastatin (PRAVACHOL) 20 MG tablet TAKE 1 TABLET(20 MG) BY MOUTH DAILY    spironolactone (ALDACTONE) 25 MG tablet Take 0.5 tablets (12.5 mg total) by mouth daily.    warfarin (COUMADIN) 2.5 MG tablet TAKE BY MOUTH AS DIRECTED AFTER ANTI-COAG CLINIC    No facility-administered encounter medications on file as of 02/10/2023.     Charlene Brooke, PharmD notified  Avel Sensor, [place] Assistant [phone]

## 2023-02-16 ENCOUNTER — Inpatient Hospital Stay: Payer: Medicare Other | Attending: Radiation Oncology

## 2023-02-16 DIAGNOSIS — C61 Malignant neoplasm of prostate: Secondary | ICD-10-CM | POA: Insufficient documentation

## 2023-02-16 LAB — PSA: Prostatic Specific Antigen: 0.39 ng/mL (ref 0.00–4.00)

## 2023-02-23 ENCOUNTER — Encounter: Payer: Self-pay | Admitting: Radiation Oncology

## 2023-02-23 ENCOUNTER — Other Ambulatory Visit: Payer: Self-pay | Admitting: *Deleted

## 2023-02-23 ENCOUNTER — Ambulatory Visit
Admission: RE | Admit: 2023-02-23 | Discharge: 2023-02-23 | Disposition: A | Discharge status: Home / Self Care | Payer: Medicare Other | Source: Ambulatory Visit | Attending: Radiation Oncology | Admitting: Radiation Oncology

## 2023-02-23 VITALS — BP 103/69 | HR 80 | Temp 97.0°F | Resp 20

## 2023-02-23 DIAGNOSIS — Z923 Personal history of irradiation: Secondary | ICD-10-CM | POA: Diagnosis not present

## 2023-02-23 DIAGNOSIS — I4891 Unspecified atrial fibrillation: Secondary | ICD-10-CM | POA: Insufficient documentation

## 2023-02-23 DIAGNOSIS — C61 Malignant neoplasm of prostate: Secondary | ICD-10-CM | POA: Insufficient documentation

## 2023-02-23 DIAGNOSIS — R9721 Rising PSA following treatment for malignant neoplasm of prostate: Secondary | ICD-10-CM | POA: Diagnosis not present

## 2023-02-23 DIAGNOSIS — J449 Chronic obstructive pulmonary disease, unspecified: Secondary | ICD-10-CM | POA: Insufficient documentation

## 2023-02-23 DIAGNOSIS — Z191 Hormone sensitive malignancy status: Secondary | ICD-10-CM | POA: Diagnosis not present

## 2023-02-23 DIAGNOSIS — E119 Type 2 diabetes mellitus without complications: Secondary | ICD-10-CM | POA: Diagnosis not present

## 2023-02-23 NOTE — Progress Notes (Signed)
Radiation Oncology Follow up Note  Name: [name]   Date:   02/23/2023 MRN:  [id number] DOB: 11/29/1946    This 77 y.o. male presents to the clinic today for 77 year old male 6 status post image guided IMRT radiation therapy for Gleason 7 (3+4) adenocarcinoma the prostate now out 4 years.  REFERRING PROVIDER: Ria Bush, MD  HPI: Patient is a 77 year old male now out 4 years having completed IMRT radiation therapy for Gleason 7 adenocarcinoma the prostate seen today in routine follow-up he has very low side effect profile.  Specifically denies any increased lower urinary tract symptoms diarrhea or fatigue..  Patient has multiple comorbidities including atrial fibrillation COPD chronic pain syndrome type 2 diabetes.  His most recent PSA is jumped to 2.39 from 0.07 a year ago  COMPLICATIONS OF TREATMENT: none  FOLLOW UP COMPLIANCE: keeps appointments   PHYSICAL EXAM:  BP 103/69 (BP Location: Left Arm, Patient Position: Sitting, Cuff Size: Normal)   Pulse 80   Temp (!) 97 F (36.1 C) (Tympanic)   Resp 40  Wheelchair-bound male in NAD.  Well-developed well-nourished patient in NAD. HEENT reveals PERLA, EOMI, discs not visualized.  Oral cavity is clear. No oral mucosal lesions are identified. Neck is clear without evidence of cervical or supraclavicular adenopathy. Lungs are clear to A&P. Cardiac examination is essentially unremarkable with regular rate and rhythm without murmur rub or thrill. Abdomen is benign with no organomegaly or masses noted. Motor sensory and DTR levels are equal and symmetric in the upper and lower extremities. Cranial nerves II through XII are grossly intact. Proprioception is intact. No peripheral adenopathy or edema is identified. No motor or sensory levels are noted. Crude visual fields are within normal range.  RADIOLOGY RESULTS: No current films for review  PLAN: Present time has a slight bump for the first time in his PSA.  I will reevaluate that in 6  months should it continue to trend upwards we will refer him to medical oncology for consideration of ADT therapy.  Patient comprehends my recommendations well.  I would like to take this opportunity to thank you for allowing me to participate in the care of your patient.Noreene Filbert, MD

## 2023-02-25 ENCOUNTER — Ambulatory Visit
Payer: Medicare Other | Attending: Cardiovascular Disease | Admitting: Pharmacist Clinician (PhC)/ Clinical Pharmacy Specialist

## 2023-02-25 DIAGNOSIS — Z5181 Encounter for therapeutic drug level monitoring: Secondary | ICD-10-CM | POA: Insufficient documentation

## 2023-02-25 DIAGNOSIS — I4891 Unspecified atrial fibrillation: Secondary | ICD-10-CM

## 2023-02-25 LAB — POCT INR: INR: 2.9 (ref 2.0–3.0)

## 2023-02-25 NOTE — Patient Instructions (Signed)
-   Continue 1.5 tablets every day - Recheck INR in 6 weeks   

## 2023-03-11 ENCOUNTER — Ambulatory Visit: Payer: Medicare Other | Admitting: Pharmacist

## 2023-03-11 DIAGNOSIS — E1140 Type 2 diabetes mellitus with diabetic neuropathy, unspecified: Secondary | ICD-10-CM

## 2023-03-11 MED ORDER — METFORMIN HCL ER 500 MG PO TB24: 500.0000 mg | ORAL_TABLET | Freq: Two times a day (BID) | ORAL | 0 refills | Status: DC

## 2023-03-11 NOTE — Progress Notes (Signed)
Care Management & Coordination Services Pharmacy Note  03/11/2023 Name:  [name] MRN:  [id number] DOB:  [birth year]  Summary: F/u visit -DM: A1c 9.7% (09/2022), worsened from 8.3% previously; pt reports compliance with metformin 500 mg BID and Jardiance 10 mg, he denies side effects; he is not checking BG often, last night 220; of note metformin last filled 12/2021 #90, unclear how patient has been taking this BID. Rx is expired now. -COPD: pt reports chronic shortness of breath, he reports compliance with Breztri and takes albuterol at night; discussed role of albuterol as rescue inhaler; pt continues to smoke 1 PPD, not ready to quit  Recommendations/Changes made from today's visit: -Renewed metformin Rx (expired 12/2022); Advised to check fasting BG; goal <150 -Advised to use albuterol PRN only  Follow up plan: -Health Concierge will call patient 1 months for DM update -Pharmacist follow up televisit scheduled for 3 months -Cardiology appt 03/23/23; PCP appt due ~04/16/23    SUBJECTIVE: [name] is an 77 y.o. year old male who is a primary patient of Ria Bush, MD.  The care coordination team was consulted for assistance with disease management and care coordination needs.    Engaged with patient by telephone for follow up visit.  Recent office visits: 01/16/23 Dr Danise Mina OV: f/u- A1c 9.7%; Start Jardiance 10 mg. Tx possible COPD exacerbation - cefdinir. RTC 3 months.  12/09/22 Dr Danise Mina OV: f/u - COPD. Increased cough for months, fatigue. Not using CPAP. Not using Breztri d/t concern over side effects. Advised to restart Breztri. Rx'd prednisone burst, refilled duoneb, norco, loratdine.  11/11/22 Dr Danise Mina OV: f/u- R hand swelling. Rx keflex, prednisone.  09/15/22 Dr Danise Mina OV: annual - schedule eye exam. Order ABI for foot pain. B12 too high - drop OTC to MWF. A1c 8.3 - start metformin 500 mg daily. TRIG too high.   Recent consult visits: 08/06/22 Dr Rockey Situ  (Cardiology): f/u- Hold amiodarone, can take PRN for HR> 100. Take metoprolol 25 mg BID. Take diltiazem 30 mg PRN for HR >100 or elevated BP.  Hospital visits: None in previous 6 months   OBJECTIVE:  Lab Results  Component Value Date   CREATININE 1.08 09/15/2022   BUN 15 09/15/2022   GFR 65.79 09/15/2022   GFRNONAA 55 (L) 11/03/2021   GFRAA 88 09/07/2017   NA 139 09/15/2022   K 4.8 09/15/2022   CALCIUM 8.6 09/15/2022   CO2 30 09/15/2022   GLUCOSE 180 (H) 09/15/2022    Lab Results  Component Value Date/Time   HGBA1C 9.7 (A) 01/16/2023 10:18 AM   HGBA1C 8.3 (H) 09/15/2022 03:16 PM   HGBA1C 6.1 12/11/2021 11:53 AM   GFR 65.79 09/15/2022 03:16 PM   GFR 77.08 01/30/2022 11:32 AM   MICROALBUR 4.9 (H) 09/10/2021 01:22 PM   MICROALBUR 3.3 (H) 04/04/2021 11:24 AM    Last diabetic Eye exam:  Lab Results  Component Value Date/Time   HMDIABEYEEXA No Retinopathy 12/30/2019 12:00 AM    Last diabetic Foot exam: No results found for: "HMDIABFOOTEX"   Lab Results  Component Value Date   CHOL 117 09/15/2022   HDL 28.10 (L) 09/15/2022   LDLCALC 69 09/10/2021   LDLDIRECT 74.0 09/15/2022   TRIG 336.0 (H) 09/15/2022   CHOLHDL 4 09/15/2022       Latest Ref Rng & Units 09/15/2022    3:16 PM 01/30/2022   11:32 AM 12/11/2021   11:53 AM  Hepatic Function  Total Protein 6.0 - 8.3 g/dL 6.2  6.5  6.1   Albumin 3.5 - 5.2 g/dL 3.6  3.5  3.2   AST 0 - 37 U/L 16  16  11    ALT 0 - 53 U/L 15  11  8    Alk Phosphatase 39 - 117 U/L 84  73  69   Total Bilirubin 0.2 - 1.2 mg/dL 0.6  0.5  0.7     Lab Results  Component Value Date/Time   TSH 3.24 12/11/2021 11:53 AM   TSH 2.97 09/10/2021 01:22 PM   FREET4 1.07 10/09/2017 10:30 AM       Latest Ref Rng & Units 11/11/2022   12:49 PM 09/15/2022    3:16 PM 01/30/2022   11:32 AM  CBC  WBC 4.0 - 10.5 K/uL 6.6  8.2  8.7   Hemoglobin 13.0 - 17.0 g/dL 14.0  14.2  11.8   Hematocrit 39.0 - 52.0 % 41.1  42.0  37.0   Platelets 150.0 - 400.0 K/uL 193.0   196.0  266.0     Lab Results  Component Value Date/Time   VD25OH 36.60 09/15/2022 03:16 PM   VD25OH 30.74 09/10/2021 01:22 PM   VITAMINB12 1,270 (H) 09/15/2022 03:16 PM   VITAMINB12 478 12/25/2021 12:08 PM    Clinical ASCVD: Yes  The ASCVD Risk score (Arnett DK, et al., 2019) failed to calculate for the following reasons:   The valid total cholesterol range is 130 to 320 mg/dL       01/16/2023   10:14 AM 09/15/2022    2:43 PM 09/10/2021   12:13 PM  Depression screen PHQ 2/9  Decreased Interest 0 0 0  Down, Depressed, Hopeless 0 0 1  PHQ - 2 Score 0 0 1  Altered sleeping 1    Tired, decreased energy 1    Change in appetite 0    Feeling bad or failure about yourself  0    Trouble concentrating 0    Moving slowly or fidgety/restless 0    Suicidal thoughts 0    PHQ-9 Score 2       Social History   Tobacco Use  Smoking Status Every Day   Packs/day: 2.50   Years: 65.00   Additional pack years: 0.00   Total pack years: 162.50   Types: Cigarettes   Last attempt to quit: 10/28/2021   Years since quitting: 1.3  Smokeless Tobacco Never  Tobacco Comments   1ppd 08/20/2021   BP Readings from Last 3 Encounters:  02/23/23 103/69  01/16/23 118/74  12/09/22 130/80   Pulse Readings from Last 3 Encounters:  02/23/23 80  01/16/23 (!) 56  12/09/22 61   Wt Readings from Last 3 Encounters:  01/16/23 216 lb (98 kg)  12/09/22 214 lb 8 oz (97.3 kg)  11/11/22 217 lb (98.4 kg)   BMI Readings from Last 3 Encounters:  01/16/23 29.29 kg/m  12/09/22 29.09 kg/m  11/11/22 29.43 kg/m    Allergies  Allergen Reactions   Oxycodone Hcl Shortness Of Breath   Glipizide Diarrhea   Sitagliptin Diarrhea   Varenicline Tartrate Other (See Comments)    REACTION: hallucinations, but on retrial did well   Wellbutrin [Bupropion Hcl] Other (See Comments)    Hallucinations   Zocor [Simvastatin] Other (See Comments)    Muscle pain    Medications Reviewed Today     Reviewed by  Charlton Haws, [facility] (Pharmacist) on 03/11/23 at [place] List Status: <None>   Medication Order Taking? Sig Documenting Provider Last Dose Status Informant  albuterol (  VENTOLIN HFA) 108 (90 Base) MCG/ACT inhaler [id number] Yes Inhale 2 puffs into the lungs every 6 (six) hours as needed for wheezing or shortness of breath. Ria Bush, MD Taking Active   amiodarone (PACERONE) 200 MG tablet [id number] No Take 200 mg by mouth as needed. Take for fast heart rate>100  Patient not taking: Reported on 03/11/2023   Minna Merritts, MD Not Taking Active            Med Note Malena Catholic Mar 11, 2023  3:35 PM) Last filled 11/2021  aspirin 81 MG EC tablet [id number] Yes Take 1 tablet (81 mg total) by mouth daily. Swallow whole. Ria Bush, MD Taking Active   Budeson-Glycopyrrol-Formoterol [facility] AEROSPHERE) 160-9-4.8 MCG/ACT Hollie Salk [id number] Yes Inhale 2 puffs into the lungs in the morning and at bedtime. Ria Bush, MD Taking Active            Med Note Charlton Haws   Tue Jan 13, 2023  4:34 PM) AZ&Me PAP - [facility]  Cholecalciferol (VITAMIN D) 50 MCG (2000 UT) CAPS [id number] Yes Take 1 capsule (2,000 Units total) by mouth daily. Ria Bush, MD Taking Active   Coenzyme Q10 (COQ-10 PO) [id number] Yes Take 1 tablet by mouth daily. [provider] Taking Active   Cyanocobalamin (B-12) 1000 MCG SUBL [id number] Yes Place 1 tablet under the tongue every Monday, Wednesday, and Friday. Ria Bush, MD Taking Active   diclofenac Sodium (VOLTAREN) 1 % GEL [id number] Yes Apply 4 g topically 3 (three) times daily.  Patient taking differently: Apply 4 g topically 3 (three) times daily. As needed   Ria Bush, MD Taking Active   diltiazem (CARDIZEM) 30 MG tablet [id number] Yes TAKE 1 TABLET(30 MG) BY MOUTH DAILY AS NEEDED FOR FAST HEART RATE Rockey Situ, Kathlene November, MD Taking Active   diphenoxylate-atropine (LOMOTIL) 2.5-0.025 MG tablet [id number] Yes  Take 1 tablet by mouth 2 (two) times daily as needed. Ria Bush, MD Taking Active   empagliflozin (JARDIANCE) 10 MG TABS tablet [id number] Yes Take 1 tablet (10 mg total) by mouth daily before breakfast. Ria Bush, MD Taking Active   Ferrous Sulfate (IRON) 325 (65 Fe) MG TABS [id number] Yes Take by mouth daily. [provider] Taking Active Self  fish oil-omega-3 fatty acids 1000 MG capsule [id number] Yes Take 2 g by mouth daily. [provider] Taking Active Spouse/Significant Other  furosemide (LASIX) 20 MG tablet [id number] No TAKE 1 TABLET BY MOUTH EVERY DAY AS NEEDED FOR FLUID RETENTION  Patient not taking: Reported on 03/11/2023   Minna Merritts, MD Not Taking Active            Med Note Malena Catholic Mar 11, 2023  3:34 PM) Last filled 11/2021  gabapentin (NEURONTIN) 600 MG tablet [id number] Yes TAKE 1 TABLET(600 MG) BY MOUTH TWICE DAILY Ria Bush, MD Taking Active   HYDROcodone-acetaminophen [facility]) 10-325 MG tablet [id number]  Take 1 tablet by mouth every 6 (six) hours as needed for moderate pain or severe pain. Ria Bush, MD  Expired 02/23/23 2359   ipratropium-albuterol (DUONEB) 0.5-2.5 (3) MG/3ML SOLN [id number] Yes Take 3 mLs by nebulization every 6 (six) hours as needed. Ria Bush, MD Taking Active   loratadine (CLARITIN) 10 MG tablet [id number] Yes Take 1 tablet (10 mg total) by mouth daily. Ria Bush, MD Taking Active   metFORMIN (GLUCOPHAGE-XR) 500 MG 24 hr tablet [id number] Yes Take 1 tablet (500 mg total) by mouth daily with breakfast.  Patient taking differently: Take 500 mg by mouth 2 (two) times daily with a meal.   Ria Bush, MD Taking Active   methocarbamol (ROBAXIN) 500 MG tablet [id number] Yes Take 1 tablet (500 mg total) by mouth 2 (two) times daily as needed for muscle spasms (sedation precautions). Ria Bush, MD Taking Active   metoprolol tartrate (LOPRESSOR) 25 MG tablet [id number] Yes  Take 1 tablet (25 mg total) by mouth 2 (two) times daily. Take for fast heart rate>100  Patient taking differently: Take 25 mg by mouth 2 (two) times daily.   Minna Merritts, MD Taking Active   midodrine (PROAMATINE) 10 MG tablet [id number] No Take 1 tablet (10 mg total) by mouth 3 (three) times daily as needed.  Patient not taking: Reported on 03/11/2023   Minna Merritts, MD Not Taking Active            Med Note Malena Catholic Mar 11, 2023  3:36 PM) Last filled 01/2021  nitroGLYCERIN (NITROLINGUAL) 0.4 MG/SPRAY spray [id number] Yes USE 1 SPRAY AS DIRECTED EVERY 5 MINUTES AS NEEDED Ria Bush, MD Taking Active   omeprazole (PRILOSEC) 40 MG capsule [id number] Yes TAKE 1 CAPSULE(40 MG) BY MOUTH DAILY Ria Bush, MD Taking Active   pravastatin (PRAVACHOL) 20 MG tablet [id number] Yes TAKE 1 TABLET(20 MG) BY MOUTH DAILY Ria Bush, MD Taking Active   spironolactone (ALDACTONE) 25 MG tablet [id number] Yes Take 0.5 tablets (12.5 mg total) by mouth daily. Ria Bush, MD Taking Active   warfarin (COUMADIN) 2.5 MG tablet [id number] Yes TAKE BY MOUTH AS DIRECTED AFTER ANTI-COAG CLINIC Gollan, Kathlene November, MD Taking Active   Med List Note Ignatius Specking, RN 01/04/18 1000): 09/07/17 MR 02/03/18  Start hydrcodone 10-325            SDOH:  (Social Determinants of Health) assessments and interventions performed: No SDOH Interventions    Flowsheet Row Chronic Care Management from 06/24/2021 in [place] at [place] from 04/18/2019 in [place] at [place] from 12/14/2017 in [place] at [place]  SDOH Interventions     Depression Interventions/Treatment  -- [id number] Score <4 Follow-up Not Indicated --  [referral to PCP]  Financial Strain Interventions Other (Comment)  [Working on PAP with pulmonology] -- --       Medication Assistance:  Breztri - AZ&Me PAP  approved 2024  Medication Access: Within the past 30 days, how often has patient missed a dose of medication? 0 Is a pillbox or other method used to improve adherence? Yes  Factors that may affect medication adherence? lack of understanding of disease management Are meds synced by current pharmacy? No  Are meds delivered by current pharmacy? No  Does patient experience delays in picking up medications due to transportation concerns? No   Upstream Services Reviewed: Is patient disadvantaged to use [facility]?: Yes  Current Rx insurance plan: [facility] [place] Name and location of Current pharmacy:  [address] [place] [address] Phone: [phone] Fax: [phone]  [facility] Pharm[facility] services reviewed with patient today?: No  Patient requests to transfer care to [facility] Pharmacy?: No  Reason patient declined to change pharmacies: Disadvantaged due to insurance/mail order  Compliance/Adherence/Medication fill history: Care Gaps: Eye exam (due 12/2020) Foot exam (due 01/2021) UACR (due 09/2022) Lung ca screening (due 01/2019)  Star-Rating Drugs: Metformin - PDC 0%; LF 12/11/21 x 90 ds Pravastatin - PDC 96%   ASSESSMENT / PLAN  Hypertension / Heart Failure (BP goal <140/90) -Controlled - per home readings; he reports he has not needed PRN medications lately (diltiazem, midodrine) -Home BP readings: 118/80, 67 -Home weight: none available -Denies hypotensive/hypertensive symptoms -Last ejection fraction: >55% (Date: 11/2021) -HF type: HFpEF (EF > 50%); NYHA Class: not on file -Considerations: hx orthostatic hypotension (sometimes on midodrine); Tobacco abuse, not interested in quitting -Current treatment: Furosemide 20 mg daily PRN - LF 11/2021 Metoprolol tartrate 25 mg BID -Appropriate, Effective, Safe, Accessible Spironolactone 25 mg - 1/2 tab daily -Appropriate,  Effective, Safe, Accessible Diltiazem 30 mg PRN -Appropriate, Effective, Safe, Accessible Midodrine 10 mg TID PRN -not needed currently -Medications previously tried: none reported -Educated on BP goals and benefits of medications for prevention of heart attack, stroke and kidney damage;  -Counseled to monitor BP at home daily -Recommended to continue current medication  Atrial Fibrillation (Goal: prevent stroke and major bleeding) -Controlled - pt has not had to use PRN meds (diltiazem, amiodarone) -CHADSVASC: 5; hx sick sinus syndrome -Follows with cardiology (Dr Rockey Situ), pt only has symptoms with tachycardia and has been told to use amiodarone and diltiazem PRN -Current treatment: Metoprolol tartrate 25 mg BID - Appropriate, Effective, Safe, Accessible Diltiazem 30 mg PRN (HR > 100) - not needed Amiodarone 200 mg PRN (HR > 100) - not needed. LF 11/2021 Warfarin 2.5 mg as directed - Appropriate, Effective, Safe, Accessible -Medications previously tried: n/a -Reviewed utility of PRN amiodarone - with half life of amiodarone ~40 days, using it PRN is not typically done; however pt appears controlled this way and cardiologist is aware he is taking it this way, reasonable to continue as is -Recommended to continue current medication  Hyperlipidemia: (LDL goal < 70) -Controlled - LDL 74 (09/2022) acceptable -Current treatment: Pravastatin 20 mg daily -Appropriate, Effective, Safe, Accessible Aspirin 81 mg daily -Appropriate, Effective, Safe, Accessible Coenezyme Q10 -Appropriate, Effective, Safe, Accessible Omega 3 Fish oil -Appropriate, Effective, Safe, Accessible Nitroglyercin 0.4 mg SL prn -Appropriate, Effective, Safe, Accessible -Medications previously tried: n/a  -Educated on Cholesterol goals; -Recommended to continue current medication  Diabetes (A1c goal <7%) -Uncontrolled - A1c 9.7% (01/2023) worsened from 8.3%; pt reports compliance with new Jardiance and denies issues -Pt  has had ~30 lb wt gain since 01/2022 -Hx peripheral neuropathy, diabetic foot ulcer (L toe) -Diet: drinks a lot of orange juice -Current home glucose readings - checks a couple times a week fasting glucose: not checking post prandial glucose: 220  -Denies hypoglycemic/hyperglycemic symptoms -Current medications: Metformin XR 500 mg daily BID - Appropriate, Query Effective Jardiance 10 mg daily  - Appropriate, Effective, Safe, Accessible -Medications previously tried: none  -Educated on A1c and blood sugar goals; -Counseled to check feet daily and get yearly eye exams -Reviewed compliance - metformin last filled 12/11/21 #90, pt endorses compliance with BID dosing however unclear how this would be possible; renewed metformin Rx to pharmacy as it is expired -Recommended to continue current medication  COPD, mixed type (Goal: control symptoms and prevent exacerbations) -Not ideally controlled - pt reports chronic shortness of breath, he is using albuterol nightly along with Breztri, unclear if this is due to increased symptoms or just his routine; he also likes to use Duoneb once a day -Tobacco use - 1 PPD -Follows with pulmonary - Dr Merrie Roof (last OV 08/20/21) -Current treatment  Breztri 160-9-4.8 mcg/act 2 puff BID (PAP) -  Appropriate, Effective, Safe, Query Accessible Albuterol HFA - Appropriate, Effective, Safe, Accessible Duoneb PRN - daily Oxygen 2L at night -Medications previously tried: n/a  -Pulmonary function testing: not on file -Exacerbations requiring treatment in last 6 months: 0 -Patient denies consistent use of maintenance inhaler -Frequency of rescue inhaler use: PRN -Counseled on Benefits of consistent maintenance inhaler use; When to use rescue inhaler; Differences between maintenance and rescue inhalers -Discussed if SOB abruptly worsens or wheezing develops, seek urgent/emergency care -Recommend to continue current medication; advised to use albuterol PRN only - use  albuterol first then Duonebs if needed  Tobacco use (Goal quit smoking) -Uncontrolled - 1 PPD -Previous quit attempts: not discussed -Continue to reassess readiness to quit  Health Maintenance -Vaccine gaps: Shingrix -Hx prostate cancer (2019 s/p XRT)   Charlene Brooke, PharmD, BCACP Clinical Pharmacist [facility] at [facility] [phone]

## 2023-03-12 ENCOUNTER — Encounter: Payer: Self-pay | Admitting: Family Medicine

## 2023-03-12 ENCOUNTER — Ambulatory Visit (INDEPENDENT_AMBULATORY_CARE_PROVIDER_SITE_OTHER): Payer: Medicare Other | Admitting: Family Medicine

## 2023-03-12 VITALS — BP 126/82 | HR 56 | Temp 97.8°F | Ht 72.0 in | Wt 215.0 lb

## 2023-03-12 DIAGNOSIS — W57XXXA Bitten or stung by nonvenomous insect and other nonvenomous arthropods, initial encounter: Secondary | ICD-10-CM

## 2023-03-12 DIAGNOSIS — S30860A Insect bite (nonvenomous) of lower back and pelvis, initial encounter: Secondary | ICD-10-CM

## 2023-03-12 MED ORDER — TRIAMCINOLONE ACETONIDE 0.5 % EX CREA: 1.0000 | TOPICAL_CREAM | Freq: Two times a day (BID) | CUTANEOUS | 0 refills | Status: DC

## 2023-03-12 NOTE — Patient Instructions (Signed)
Visit Information  Phone number for Pharmacist: [phone]  Thank you for meeting with me to discuss your medications! Below is a summary of what we talked about during the visit:   Recommendations/Changes made from today's visit: -Renewed metformin Rx (expired 12/2022); Advised to check fasting BG; goal <150 -Advised to use albuterol PRN only  Follow up plan: -Health Concierge will call patient 1 months for COPD update -Pharmacist follow up televisit scheduled for 2 months -Cardiology appt 03/23/23   Charlene Brooke, PharmD, BCACP Clinical Pharmacist [facility] at [facility] [phone]

## 2023-03-12 NOTE — Assessment & Plan Note (Signed)
Acute, with associated allergic reaction.  No clear sign of local bacterial infection.  Given small amount of soreness at area, I did consider whether this is instead a presentation of shingles, but he is 100% positive he removed a tick at this site and that  it was moving.  Will treat with topical triamcinolone 0.5 mg twice daily.  Return and ER precautions provided.

## 2023-03-12 NOTE — Patient Instructions (Addendum)
Current local. allergic reaction.  Apply topical triamcinolone 0.5% twice daily.  Call if sign of redness spreading or symptoms worsening.

## 2023-03-12 NOTE — Progress Notes (Signed)
Patient ID: [name], male    DOB: [birth year], 77 y.o.   MRN: [id number]  This visit was conducted in person.  BP 126/82 (BP Location: Right Arm)   Pulse (!) 56   Temp 97.8 F (36.6 C) (Temporal)   Ht 6' (1.829 m)   Wt 215 lb (97.5 kg)   SpO2 98%   BMI 29.16 kg/m    CC:  Chief Complaint  Patient presents with   Tick Removal    Tick head removed from the left posterior upper thigh about a couple days. Itching around the area.    Subjective:   HPI: [name] is a 77 y.o. male presenting on 03/12/2023 for Tick Removal (Tick head removed from the left posterior upper thigh about a couple days. Itching around the area.)  4 days ago noted take on the left upper leg.. not sure how long tick was on him.  Tick was large.   Area itchy.  No fever. No new joint pain. No flu like symptoms.  Has scab at site.   He has tried treating with cortisone OTC cream.     Relevant past medical, surgical, family and social history reviewed and updated as indicated. Interim medical history since our last visit reviewed. Allergies and medications reviewed and updated. Outpatient Medications Prior to Visit  Medication Sig Dispense Refill   albuterol (VENTOLIN HFA) 108 (90 Base) MCG/ACT inhaler Inhale 2 puffs into the lungs every 6 (six) hours as needed for wheezing or shortness of breath. 18 g 3   amiodarone (PACERONE) 200 MG tablet Take 200 mg by mouth as needed. Take for fast heart rate>100     aspirin 81 MG EC tablet Take 1 tablet (81 mg total) by mouth daily. Swallow whole.     Budeson-Glycopyrrol-Formoterol (BREZTRI AEROSPHERE) 160-9-4.8 MCG/ACT AERO Inhale 2 puffs into the lungs in the morning and at bedtime. 3 each 3   Cholecalciferol (VITAMIN D) 50 MCG (2000 UT) CAPS Take 1 capsule (2,000 Units total) by mouth daily. 30 capsule    Coenzyme Q10 (COQ-10 PO) Take 1 tablet by mouth daily.     Cyanocobalamin (B-12) 1000 MCG SUBL Place 1 tablet under the tongue every Monday, Wednesday,  and Friday. 30 tablet    diclofenac Sodium (VOLTAREN) 1 % GEL Apply 4 g topically 3 (three) times daily. (Patient taking differently: Apply 4 g topically 3 (three) times daily. As needed) 100 g 3   diltiazem (CARDIZEM) 30 MG tablet TAKE 1 TABLET(30 MG) BY MOUTH DAILY AS NEEDED FOR FAST HEART RATE 30 tablet 5   diphenoxylate-atropine (LOMOTIL) 2.5-0.025 MG tablet Take 1 tablet by mouth 2 (two) times daily as needed. 20 tablet 0   empagliflozin (JARDIANCE) 10 MG TABS tablet Take 1 tablet (10 mg total) by mouth daily before breakfast. 30 tablet 3   Ferrous Sulfate (IRON) 325 (65 Fe) MG TABS Take by mouth daily.     fish oil-omega-3 fatty acids 1000 MG capsule Take 2 g by mouth daily.     furosemide (LASIX) 20 MG tablet TAKE 1 TABLET BY MOUTH EVERY DAY AS NEEDED FOR FLUID RETENTION 90 tablet 0   gabapentin (NEURONTIN) 600 MG tablet TAKE 1 TABLET(600 MG) BY MOUTH TWICE DAILY 180 tablet 1   ipratropium-albuterol (DUONEB) 0.5-2.5 (3) MG/3ML SOLN Take 3 mLs by nebulization every 6 (six) hours as needed. 360 mL 6   loratadine (CLARITIN) 10 MG tablet Take 1 tablet (10 mg total) by mouth daily.  metFORMIN (GLUCOPHAGE-XR) 500 MG 24 hr tablet Take 1 tablet (500 mg total) by mouth 2 (two) times daily with a meal. 180 tablet 0   methocarbamol (ROBAXIN) 500 MG tablet Take 1 tablet (500 mg total) by mouth 2 (two) times daily as needed for muscle spasms (sedation precautions). 180 tablet 0   metoprolol tartrate (LOPRESSOR) 25 MG tablet Take 1 tablet (25 mg total) by mouth 2 (two) times daily. Take for fast heart rate>100 (Patient taking differently: Take 25 mg by mouth 2 (two) times daily.) 180 tablet 2   midodrine (PROAMATINE) 10 MG tablet Take 1 tablet (10 mg total) by mouth 3 (three) times daily as needed. 90 tablet 1   nitroGLYCERIN (NITROLINGUAL) 0.4 MG/SPRAY spray USE 1 SPRAY AS DIRECTED EVERY 5 MINUTES AS NEEDED 4.9 g 1   omeprazole (PRILOSEC) 40 MG capsule TAKE 1 CAPSULE(40 MG) BY MOUTH DAILY 90 capsule 1    pravastatin (PRAVACHOL) 20 MG tablet TAKE 1 TABLET(20 MG) BY MOUTH DAILY 90 tablet 3   spironolactone (ALDACTONE) 25 MG tablet Take 0.5 tablets (12.5 mg total) by mouth daily. 45 tablet 1   warfarin (COUMADIN) 2.5 MG tablet TAKE BY MOUTH AS DIRECTED AFTER ANTI-COAG CLINIC 135 tablet 1   HYDROcodone-acetaminophen (NORCO) 10-325 MG tablet Take 1 tablet by mouth every 6 (six) hours as needed for moderate pain or severe pain. 120 tablet 0   No facility-administered medications prior to visit.     Per HPI unless specifically indicated in ROS section below Review of Systems  Constitutional:  Negative for fatigue and fever.  HENT:  Negative for ear pain.   Eyes:  Negative for pain.  Respiratory:  Negative for cough and shortness of breath.   Cardiovascular:  Negative for chest pain, palpitations and leg swelling.  Gastrointestinal:  Negative for abdominal pain.  Genitourinary:  Negative for dysuria.  Musculoskeletal:  Negative for arthralgias.  Neurological:  Negative for syncope, light-headedness and headaches.  Psychiatric/Behavioral:  Negative for dysphoric mood.    Objective:  BP 126/82 (BP Location: Right Arm)   Pulse (!) 56   Temp 97.8 F (36.6 C) (Temporal)   Ht 6' (1.829 m)   Wt 215 lb (97.5 kg)   SpO2 98%   BMI 29.16 kg/m   Wt Readings from Last 3 Encounters:  03/12/23 215 lb (97.5 kg)  01/16/23 216 lb (98 kg)  12/09/22 214 lb 8 oz (97.3 kg)      Physical Exam Constitutional:      Appearance: He is well-developed.  HENT:     Head: Normocephalic.     Right Ear: Hearing normal.     Left Ear: Hearing normal.     Nose: Nose normal.  Neck:     Thyroid: No thyroid mass or thyromegaly.     Vascular: No carotid bruit.     Trachea: Trachea normal.  Cardiovascular:     Rate and Rhythm: Normal rate and regular rhythm.     Pulses: Normal pulses.     Heart sounds: Heart sounds not distant. No murmur heard.    No friction rub. No gallop.     Comments: No peripheral  edema Pulmonary:     Effort: Pulmonary effort is normal. No respiratory distress.     Breath sounds: Normal breath sounds.  Skin:    General: Skin is warm and dry.     Findings: No rash.     Comments: Left buttock with 1 cm diameter eschar/scab with slight surrounding erythema, no warmth, no pustule,  no vesicle  Psychiatric:        Speech: Speech normal.        Behavior: Behavior normal.        Thought Content: Thought content normal.       Results for orders placed or performed in visit on 02/25/23  POCT INR  Result Value Ref Range   INR 2.9 2.0 - 3.0   POC INR     *Note: Due to a large number of results and/or encounters for the requested time period, some results have not been displayed. A complete set of results can be found in Results Review.    Assessment and Plan  There are no diagnoses linked to this encounter.  No follow-ups on file.   Eliezer Lofts, MD

## 2023-03-13 ENCOUNTER — Telehealth: Payer: Self-pay | Admitting: Family Medicine

## 2023-03-13 NOTE — Telephone Encounter (Signed)
Sounds like rash at tick bite site is improved.  Continue triamcinolone twice daily.  Tick bite was only a couple of days ago so very unlikely to be tickborne illness causing symptoms.  I believe he is likely at his baseline, he mentioned yesterday he always has balance issues, but please verify with wife that his speech is at baseline and there are  no new neurologic symptoms, or fever.  Is neck stiffness typical for him or is this a new issue?

## 2023-03-13 NOTE — Telephone Encounter (Signed)
FYI: This call has been transferred to Access Nurse. Once the result note has been entered staff can address the message at that time.  Patient called in with the following symptoms:  Red Word: Patient wife called in and stated that patient was seen for a tick bite by Dr. Ermalene Searing on 03/12/2023. He was prescribed some medication for the bite. She stated that he is having trouble walking and stiffness in his neck.    Please advise at Mobile [phone] (mobile)  Message is routed to Provider Pool and [facility] Triage

## 2023-03-13 NOTE — Telephone Encounter (Addendum)
I spoke with [name] (DPR signed) and [name] notified per Dr Albertina Senegal note and [facility] voiced understanding with instructions and answered  questions also. [name] is not with pt now but should be home today about 4:30 pm and she will do her own assessment of pt. [name] said now a friend is with pt and if pt condition changes or worsens prior to [place] getting home 911 would be called by friend. [name] verified that rash at tick bite is improving and she will continue to apply cream as instructed. [name] confirmed pt does have problems with balance and does not think balance is worse today than usual but [name] will reevaluate gait and walking when gets home. [name] said pt was able to walk today. [name] said pts speech is the same as always. No new neurological symptoms per [facility]. [name] said pt does not have H/A and no fever.[name] said that she misunderstood earlier and pt is not having any neck stiffness. UC & ED precautions given and [name] voiced understanding. [name] said she would cb with update if pt condition has changed or worsened. Sending note to Dr Ermalene Searing and [place] pool.

## 2023-03-13 NOTE — Telephone Encounter (Signed)
Dr. Ermalene Searing sending this message to you because you saw pt yesterday and Dr. Sharen Hones if off.    Office received a call from Access Nurse that pt was advised to go to ED due to the symptoms he was presenting with and pt declined.  Reported symptoms: unable to ambulate today due to poor balance, stiffness in neck. No fever reported.  Pt was seen in office yesterday for tick bite started on   triamcinolone cream BID.  Per Access nurse, pts speech is slurred.    Writer attempted to call pt back no answer, left a voicemail to call office back.

## 2023-03-13 NOTE — Telephone Encounter (Signed)
[name], pts wife returned phone call. She reports that his vitals around 12:20p were BP 120/80 HR 66 Blood sugar 177 She reports that the area where the tick bite is smaller than the size of a quarter and has a small area of white in the middle.  She does not feel that it has gotten larger, mostly just itches per pt.  [name] reports that she will monitor him but does not feel like he needs to go to ED right now.  Normally when he has trouble ambulating its because he is in A-fib and that his heart rate is high but now his heart rate is normal.  ED precautions given.

## 2023-03-16 ENCOUNTER — Ambulatory Visit: Payer: Medicare Other | Admitting: Family Medicine

## 2023-03-16 ENCOUNTER — Other Ambulatory Visit: Payer: Self-pay | Admitting: Family Medicine

## 2023-03-16 DIAGNOSIS — G894 Chronic pain syndrome: Secondary | ICD-10-CM

## 2023-03-16 NOTE — Telephone Encounter (Signed)
Name of Medication: Hydrocodone Name of Pharmacy: [facility] Last Fill or Written Date and Quantity: 02/23/23 #120 Last Office Visit and Type: 01/16/23 Next Office Visit and Type: 03/17/23 Last Controlled Substance Agreement Date: 07/06/17 Last UDS:07/06/17

## 2023-03-17 ENCOUNTER — Ambulatory Visit (INDEPENDENT_AMBULATORY_CARE_PROVIDER_SITE_OTHER): Payer: Medicare Other | Admitting: Family Medicine

## 2023-03-17 ENCOUNTER — Encounter: Payer: Self-pay | Admitting: Family Medicine

## 2023-03-17 VITALS — BP 103/78 | HR 62 | Temp 97.6°F | Ht 72.0 in | Wt 215.0 lb

## 2023-03-17 DIAGNOSIS — S70362A Insect bite (nonvenomous), left thigh, initial encounter: Secondary | ICD-10-CM | POA: Diagnosis not present

## 2023-03-17 DIAGNOSIS — R2681 Unsteadiness on feet: Secondary | ICD-10-CM

## 2023-03-17 DIAGNOSIS — T148XXA Other injury of unspecified body region, initial encounter: Secondary | ICD-10-CM

## 2023-03-17 MED ORDER — MUPIROCIN CALCIUM 2 % EX CREA: 1.0000 | TOPICAL_CREAM | Freq: Two times a day (BID) | CUTANEOUS | 0 refills | Status: DC

## 2023-03-17 MED ORDER — AMOXICILLIN-POT CLAVULANATE 875-125 MG PO TABS: 1.0000 | ORAL_TABLET | Freq: Two times a day (BID) | ORAL | 0 refills | Status: AC

## 2023-03-17 NOTE — Assessment & Plan Note (Addendum)
Continues cane/wheelchair use.  Worse after recent tick bite. However not consistent with tick borne illness.  He does have evidence of atrophy to LLE but gross strength is overall intact.

## 2023-03-17 NOTE — Patient Instructions (Addendum)
Use antibiotic ointment to lesion left upper leg.  Take antibiotic augmentin 7 day course.  Let us know if not improving with treatment.

## 2023-03-17 NOTE — Assessment & Plan Note (Signed)
Pt thinks this wound started as tick bite, he states he fully removed tick.  Now has eschar present at area.  Due to poorly healing wound, recommend antibiotic ointment to wound - mupirocin sent to pharmacy. Will also Rx augmentin 7d course.  Update if not improving with this treatment.

## 2023-03-17 NOTE — Progress Notes (Signed)
Ph: [phone]       Fax: [phone]   Patient ID: [name], male    DOB: Nov 18, 1946, 77 y.o.   MRN: [id number]  This visit was conducted in person.  BP 103/78   Pulse 62   Temp 97.6 F (36.4 C) (Temporal)   Ht 6' (1.829 m)   Wt 215 lb (97.5 kg)   SpO2 92%   BMI 29.16 kg/m   BP Readings from Last 3 Encounters:  03/17/23 103/78  03/12/23 126/82  02/23/23 103/69   CC: tick bite, walking difficulty Subjective:   HPI: [name] is a 77 y.o. male presenting on 03/17/2023 for Tick Removal (Left side of bottom 2 weeks ago. Was removed but has rash. Has noticed more off balance  )   Seen last week after tick bite to left upper leg - tick was found and removed on 03/08/2023, unsure duration of attachment. Saw Dr Ermalene Searing last week with possible allergic reaction at site of bite, placed on topical triamcinolone cream. This hasn't helped.   Notes persistent itching around tick bite.  Notes worsening imbalance since tick bite.   No fever/chills, new rash, abd pain, headache, nausea. No dizziness.   Notes left leg smaller in size and weaker - ongoing for a long time.      Relevant past medical, surgical, family and social history reviewed and updated as indicated. Interim medical history since our last visit reviewed. Allergies and medications reviewed and updated. Outpatient Medications Prior to Visit  Medication Sig Dispense Refill   albuterol (VENTOLIN HFA) 108 (90 Base) MCG/ACT inhaler Inhale 2 puffs into the lungs every 6 (six) hours as needed for wheezing or shortness of breath. 18 g 3   amiodarone (PACERONE) 200 MG tablet Take 200 mg by mouth as needed. Take for fast heart rate>100     aspirin 81 MG EC tablet Take 1 tablet (81 mg total) by mouth daily. Swallow whole.     Budeson-Glycopyrrol-Formoterol (BREZTRI AEROSPHERE) 160-9-4.8 MCG/ACT AERO Inhale 2 puffs into the lungs in the morning and at bedtime. 3 each 3   Cholecalciferol (VITAMIN D) 50 MCG (2000 UT) CAPS  Take 1 capsule (2,000 Units total) by mouth daily. 30 capsule    Coenzyme Q10 (COQ-10 PO) Take 1 tablet by mouth daily.     Cyanocobalamin (B-12) 1000 MCG SUBL Place 1 tablet under the tongue every Monday, Wednesday, and Friday. 30 tablet    diclofenac Sodium (VOLTAREN) 1 % GEL Apply 4 g topically 3 (three) times daily. (Patient taking differently: Apply 4 g topically 3 (three) times daily. As needed) 100 g 3   diltiazem (CARDIZEM) 30 MG tablet TAKE 1 TABLET(30 MG) BY MOUTH DAILY AS NEEDED FOR FAST HEART RATE 30 tablet 5   diphenoxylate-atropine (LOMOTIL) 2.5-0.025 MG tablet Take 1 tablet by mouth 2 (two) times daily as needed. 20 tablet 0   empagliflozin (JARDIANCE) 10 MG TABS tablet Take 1 tablet (10 mg total) by mouth daily before breakfast. 30 tablet 3   Ferrous Sulfate (IRON) 325 (65 Fe) MG TABS Take by mouth daily.     fish oil-omega-3 fatty acids 1000 MG capsule Take 2 g by mouth daily.     furosemide (LASIX) 20 MG tablet TAKE 1 TABLET BY MOUTH EVERY DAY AS NEEDED FOR FLUID RETENTION 90 tablet 0   gabapentin (NEURONTIN) 600 MG tablet TAKE 1 TABLET(600 MG) BY MOUTH TWICE DAILY 180 tablet 1   ipratropium-albuterol (DUONEB) 0.5-2.5 (3) MG/3ML SOLN Take 3  mLs by nebulization every 6 (six) hours as needed. 360 mL 6   loratadine (CLARITIN) 10 MG tablet Take 1 tablet (10 mg total) by mouth daily.     metFORMIN (GLUCOPHAGE-XR) 500 MG 24 hr tablet Take 1 tablet (500 mg total) by mouth 2 (two) times daily with a meal. 180 tablet 0   methocarbamol (ROBAXIN) 500 MG tablet Take 1 tablet (500 mg total) by mouth 2 (two) times daily as needed for muscle spasms (sedation precautions). 180 tablet 0   metoprolol tartrate (LOPRESSOR) 25 MG tablet Take 1 tablet (25 mg total) by mouth 2 (two) times daily. Take for fast heart rate>100 (Patient taking differently: Take 25 mg by mouth 2 (two) times daily.) 180 tablet 2   midodrine (PROAMATINE) 10 MG tablet Take 1 tablet (10 mg total) by mouth 3 (three) times daily as  needed. 90 tablet 1   nitroGLYCERIN (NITROLINGUAL) 0.4 MG/SPRAY spray USE 1 SPRAY AS DIRECTED EVERY 5 MINUTES AS NEEDED 4.9 g 1   omeprazole (PRILOSEC) 40 MG capsule TAKE 1 CAPSULE(40 MG) BY MOUTH DAILY 90 capsule 1   pravastatin (PRAVACHOL) 20 MG tablet TAKE 1 TABLET(20 MG) BY MOUTH DAILY 90 tablet 3   spironolactone (ALDACTONE) 25 MG tablet Take 0.5 tablets (12.5 mg total) by mouth daily. 45 tablet 1   triamcinolone cream (KENALOG) 0.5 % Apply 1 Application topically 2 (two) times daily. 30 g 0   warfarin (COUMADIN) 2.5 MG tablet TAKE BY MOUTH AS DIRECTED AFTER ANTI-COAG CLINIC 135 tablet 1   HYDROcodone-acetaminophen (NORCO) 10-325 MG tablet Take 1 tablet by mouth every 6 (six) hours as needed for moderate pain or severe pain. 120 tablet 0   No facility-administered medications prior to visit.     Per HPI unless specifically indicated in ROS section below Review of Systems  Objective:  BP 103/78   Pulse 62   Temp 97.6 F (36.4 C) (Temporal)   Ht 6' (1.829 m)   Wt 215 lb (97.5 kg)   SpO2 92%   BMI 29.16 kg/m   Wt Readings from Last 3 Encounters:  03/17/23 215 lb (97.5 kg)  03/12/23 215 lb (97.5 kg)  01/16/23 216 lb (98 kg)      Physical Exam Vitals and nursing note reviewed.  Constitutional:      Appearance: Normal appearance.     Comments:  Sitting in wheelchair  Able to stand with cane assistance   Musculoskeletal:     Right lower leg: No edema.     Left lower leg: No edema.  Skin:    General: Skin is warm and dry.     Findings: Lesion present.     Comments:  Eschar to left lateral leg at site of possible tick bite.  No significant erythema or surrounding rash.  No other rash to skin.   Neurological:     Mental Status: He is alert.     Sensory: Sensation is intact.     Motor: Atrophy (mild noted to left leg, longstanding per patient) present.     Comments:  5/5 strength BLE Sensation intact       Results for orders placed or performed in visit on 02/25/23   POCT INR  Result Value Ref Range   INR 2.9 2.0 - 3.0   POC INR     *Note: Due to a large number of results and/or encounters for the requested time period, some results have not been displayed. A complete set of results can be found in Results  Review.     Assessment & Plan:   Problem List Items Addressed This Visit     General unsteadiness    Continues cane/wheelchair use.  Worse after recent tick bite. However not consistent with tick borne illness.  He does have evidence of atrophy to LLE but gross strength is overall intact.       Wound of skin - Primary    Pt thinks this wound started as tick bite, he states he fully removed tick.  Now has eschar present at area.  Due to poorly healing wound, recommend antibiotic ointment to wound - mupirocin sent to pharmacy. Will also Rx augmentin 7d course.  Update if not improving with this treatment.         Meds ordered this encounter  Medications   amoxicillin-clavulanate (AUGMENTIN) 875-125 MG tablet    Sig: Take 1 tablet by mouth 2 (two) times daily for 7 days.    Dispense:  14 tablet    Refill:  0   mupirocin cream (BACTROBAN) 2 %    Sig: Apply 1 Application topically 2 (two) times daily. To affected wound on leg    Dispense:  15 g    Refill:  0    No orders of the defined types were placed in this encounter.   Patient Instructions  Use antibiotic ointment to lesion left upper leg.  Take antibiotic augmentin 7 day course.  Let us know if not improving with treatment.   Follow up plan: Return if symptoms worsen or fail to improve.  Eustaquio Boyden, MD

## 2023-03-19 MED ORDER — HYDROCODONE-ACETAMINOPHEN 10-325 MG PO TABS: 1.0000 | ORAL_TABLET | Freq: Four times a day (QID) | ORAL | 0 refills | Status: DC | PRN

## 2023-03-19 NOTE — Telephone Encounter (Signed)
ERx 

## 2023-03-23 ENCOUNTER — Encounter: Payer: Self-pay | Admitting: Medical

## 2023-03-23 ENCOUNTER — Ambulatory Visit: Payer: Medicare Other | Attending: Medical | Admitting: Medical

## 2023-03-23 VITALS — BP 97/70 | HR 68 | Ht 72.0 in | Wt 208.0 lb

## 2023-03-23 DIAGNOSIS — I959 Hypotension, unspecified: Secondary | ICD-10-CM | POA: Diagnosis not present

## 2023-03-23 DIAGNOSIS — I714 Abdominal aortic aneurysm, without rupture, unspecified: Secondary | ICD-10-CM | POA: Diagnosis not present

## 2023-03-23 DIAGNOSIS — E782 Mixed hyperlipidemia: Secondary | ICD-10-CM

## 2023-03-23 DIAGNOSIS — I480 Paroxysmal atrial fibrillation: Secondary | ICD-10-CM
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
## 2023-03-23 NOTE — Patient Instructions (Signed)
Medication Instructions:  Your physician recommends that you continue on your current medications as directed. Please refer to the Current Medication list given to you today.  *If you need a refill on your cardiac medications before your next appointment, please call your pharmacy*  Lab Work: -None ordered If you have labs (blood work) drawn today and your tests are completely normal, you will receive your results only by: MyChart Message (if you have MyChart) OR A paper copy in the mail If you have any lab test that is abnormal or we need to change your treatment, we will call you to review the results.  Testing/Procedures: -None ordered  Follow-Up: At [facility], you and your health needs are our priority.  As part of our continuing mission to provide you with exceptional heart care, we have created designated Provider Care Teams.  These Care Teams include your primary Cardiologist (physician) and Advanced Practice Providers (APPs -  Physician Assistants and Nurse Practitioners) who all work together to provide you with the care you need, when you need it.  We recommend signing up for the patient portal called "MyChart".  Sign up information is provided on this After Visit Summary.  MyChart is used to connect with patients for Virtual Visits (Telemedicine).  Patients are able to view lab/test results, encounter notes, upcoming appointments, etc.  Non-urgent messages can be sent to your provider as well.   To learn more about what you can do with MyChart, go to [email or web address].    Your next appointment:   3 month(s)  Provider:   You may see Julien Nordmann, MD or one of the following Advanced Practice Providers on your designated Care Team:   Nicolasa Ducking, NP Eula Listen, PA-C Cadence Fransico Michael, PA-C Charlsie Quest, NP   Other Instructions -None

## 2023-04-05 ENCOUNTER — Encounter: Payer: Self-pay | Admitting: Family Medicine

## 2023-04-05 DIAGNOSIS — I10 Essential (primary) hypertension: Secondary | ICD-10-CM

## 2023-04-06 MED ORDER — SPIRONOLACTONE 25 MG PO TABS: 12.5000 mg | ORAL_TABLET | Freq: Every day | ORAL | 1 refills | Status: DC

## 2023-04-06 NOTE — Telephone Encounter (Signed)
E-scribed refill to [facility].

## 2023-04-08 ENCOUNTER — Ambulatory Visit: Payer: Medicare Other | Attending: Cardiovascular Disease | Admitting: *Deleted

## 2023-04-08 DIAGNOSIS — I4891 Unspecified atrial fibrillation: Secondary | ICD-10-CM | POA: Insufficient documentation

## 2023-04-08 DIAGNOSIS — Z5181 Encounter for therapeutic drug level monitoring: Secondary | ICD-10-CM | POA: Insufficient documentation

## 2023-04-08 LAB — POCT INR: INR: 2.8 (ref 2.0–3.0)

## 2023-04-08 NOTE — Patient Instructions (Signed)
-   Continue 1.5 tablets every day - Recheck INR in 6 weeks   

## 2023-04-10 ENCOUNTER — Telehealth: Payer: Self-pay

## 2023-04-10 NOTE — Progress Notes (Signed)
Care Management & Coordination Services Pharmacy Team  Reason for Encounter: COPD  Contacted patient to discuss COPD disease state. Unsuccessful outreach. Left voicemail for patient to return call.   Medications: Outpatient Encounter Medications as of 04/10/2023  Medication Sig Note   albuterol (VENTOLIN HFA) 108 (90 Base) MCG/ACT inhaler Inhale 2 puffs into the lungs every 6 (six) hours as needed for wheezing or shortness of breath.    amiodarone (PACERONE) 200 MG tablet Take 200 mg by mouth as needed. Take for fast heart rate>100 03/11/2023: Last filled 11/2021   aspirin 81 MG EC tablet Take 1 tablet (81 mg total) by mouth daily. Swallow whole.    Budeson-Glycopyrrol-Formoterol (BREZTRI AEROSPHERE) 160-9-4.8 MCG/ACT AERO Inhale 2 puffs into the lungs in the morning and at bedtime. 01/13/2023: [facility] PAP - [facility]   Cholecalciferol (VITAMIN D) 50 MCG (2000 UT) CAPS Take 1 capsule (2,000 Units total) by mouth daily.    Coenzyme Q10 (COQ-10 PO) Take 1 tablet by mouth daily.    Cyanocobalamin (B-12) 1000 MCG SUBL Place 1 tablet under the tongue every Monday, Wednesday, and Friday.    diclofenac Sodium (VOLTAREN) 1 % GEL Apply 4 g topically 3 (three) times daily. (Patient taking differently: Apply 4 g topically 3 (three) times daily. As needed)    diltiazem (CARDIZEM) 30 MG tablet TAKE 1 TABLET(30 MG) BY MOUTH DAILY AS NEEDED FOR FAST HEART RATE    diphenoxylate-atropine (LOMOTIL) 2.5-0.025 MG tablet Take 1 tablet by mouth 2 (two) times daily as needed.    empagliflozin (JARDIANCE) 10 MG TABS tablet Take 1 tablet (10 mg total) by mouth daily before breakfast.    Ferrous Sulfate (IRON) 325 (65 Fe) MG TABS Take by mouth daily.    fish oil-omega-3 fatty acids 1000 MG capsule Take 2 g by mouth daily.    furosemide (LASIX) 20 MG tablet TAKE 1 TABLET BY MOUTH EVERY DAY AS NEEDED FOR FLUID RETENTION 03/11/2023: Last filled 11/2021   gabapentin (NEURONTIN) 600 MG tablet TAKE 1 TABLET(600 MG) BY MOUTH  TWICE DAILY    HYDROcodone-acetaminophen (NORCO) 10-325 MG tablet Take 1 tablet by mouth every 6 (six) hours as needed for moderate pain or severe pain.    ipratropium-albuterol (DUONEB) 0.5-2.5 (3) MG/3ML SOLN Take 3 mLs by nebulization every 6 (six) hours as needed.    loratadine (CLARITIN) 10 MG tablet Take 1 tablet (10 mg total) by mouth daily.    metFORMIN (GLUCOPHAGE-XR) 500 MG 24 hr tablet Take 1 tablet (500 mg total) by mouth 2 (two) times daily with a meal.    methocarbamol (ROBAXIN) 500 MG tablet Take 1 tablet (500 mg total) by mouth 2 (two) times daily as needed for muscle spasms (sedation precautions).    metoprolol tartrate (LOPRESSOR) 25 MG tablet Take 1 tablet (25 mg total) by mouth 2 (two) times daily. Take for fast heart rate>100 (Patient taking differently: Take 25 mg by mouth 2 (two) times daily.)    midodrine (PROAMATINE) 10 MG tablet Take 1 tablet (10 mg total) by mouth 3 (three) times daily as needed. 03/11/2023: Last filled 01/2021   mupirocin cream (BACTROBAN) 2 % Apply 1 Application topically 2 (two) times daily. To affected wound on leg    nitroGLYCERIN (NITROLINGUAL) 0.4 MG/SPRAY spray USE 1 SPRAY AS DIRECTED EVERY 5 MINUTES AS NEEDED    omeprazole (PRILOSEC) 40 MG capsule TAKE 1 CAPSULE(40 MG) BY MOUTH DAILY    pravastatin (PRAVACHOL) 20 MG tablet TAKE 1 TABLET(20 MG) BY MOUTH DAILY  spironolactone (ALDACTONE) 25 MG tablet Take 0.5 tablets (12.5 mg total) by mouth daily.    triamcinolone cream (KENALOG) 0.5 % Apply 1 Application topically 2 (two) times daily.    warfarin (COUMADIN) 2.5 MG tablet TAKE BY MOUTH AS DIRECTED AFTER ANTI-COAG CLINIC    No facility-administered encounter medications on file as of 04/10/2023.     Al Corpus, PharmD notified  Burt Knack, [facility] Clinical Pharmacy Assistant [phone]

## 2023-04-13 DIAGNOSIS — H25813 Combined forms of age-related cataract, bilateral: Secondary | ICD-10-CM | POA: Diagnosis not present

## 2023-04-21 ENCOUNTER — Other Ambulatory Visit: Payer: Self-pay | Admitting: Cardiovascular Disease

## 2023-04-21 NOTE — Telephone Encounter (Signed)
Prescription refill request received for warfarin Lov:  [place]. 4/15 /2024 Next INR check: 6/12 Warfarin tablet strength: 2.5

## 2023-04-21 NOTE — Telephone Encounter (Signed)
Refill request for Warfarin

## 2023-04-28 ENCOUNTER — Other Ambulatory Visit: Payer: Self-pay | Admitting: Family Medicine

## 2023-04-28 DIAGNOSIS — M545 Low back pain, unspecified: Secondary | ICD-10-CM

## 2023-04-29 NOTE — Telephone Encounter (Signed)
Message from pt via pharmacy:   Hi Dr Reece Agar. I am requesting refill authorization for this medication. In addition, I have 4 pills remaining. Thank you.   Robaxin Last rx:  01/16/23, #180 Last OV:  03/17/23, tick removal Next OV:  none

## 2023-04-30 MED ORDER — METHOCARBAMOL 500 MG PO TABS
500.0000 mg | ORAL_TABLET | Freq: Two times a day (BID) | ORAL | 0 refills | Status: DC | PRN
Start: 2023-04-30 — End: 2023-05-07

## 2023-05-01 DIAGNOSIS — H25013 Cortical age-related cataract, bilateral: Secondary | ICD-10-CM | POA: Diagnosis not present

## 2023-05-01 DIAGNOSIS — H25043 Posterior subcapsular polar age-related cataract, bilateral: Secondary | ICD-10-CM | POA: Diagnosis not present

## 2023-05-01 DIAGNOSIS — H2513 Age-related nuclear cataract, bilateral: Secondary | ICD-10-CM | POA: Diagnosis not present

## 2023-05-01 DIAGNOSIS — E119 Type 2 diabetes mellitus without complications: Secondary | ICD-10-CM | POA: Diagnosis not present

## 2023-05-01 DIAGNOSIS — H2512 Age-related nuclear cataract, left eye: Secondary | ICD-10-CM | POA: Diagnosis not present

## 2023-05-05 ENCOUNTER — Telehealth: Payer: Self-pay

## 2023-05-05 NOTE — Telephone Encounter (Signed)
I spoke with [name] (DPR signed) and for 1 wk pt has had SOB on and off and last week had runny nose and sneezing but no runny nose today. No fever. Pt is resting in bed this morning and is not having SOB. Pt did not have O2 cannula in nose this morning and [name] said when cannula was replaced helped the breathing. Pt usually uses 2 - 3 L of oxygen at nighttime.pt has not had any CP but pt has had some stress this past wk. [name] does not think pt needs to go to [facility] or ED. I offered appt for this morning (only wants to see Dr Reece Agar) but [name] is in [place] and cannot get pt to appt on time. [name] request appt with Dr Reece Agar on 05/06/23. Appt was scheduled with Dr Reece Agar on 05/06/23 at 10 AM with UC & ED precautions and [name] voiced understanding. Sending note to Dr Reece Agar and G pool.

## 2023-05-05 NOTE — Telephone Encounter (Signed)
Noted. Will see tomorrow. 

## 2023-05-06 ENCOUNTER — Ambulatory Visit (INDEPENDENT_AMBULATORY_CARE_PROVIDER_SITE_OTHER)
Admission: RE | Admit: 2023-05-06 | Discharge: 2023-05-06 | Disposition: A | Discharge status: Home / Self Care | Payer: Medicare Other | Source: Ambulatory Visit | Attending: Family Medicine | Admitting: Family Medicine

## 2023-05-06 ENCOUNTER — Encounter: Payer: Self-pay | Admitting: Family Medicine

## 2023-05-06 ENCOUNTER — Ambulatory Visit (INDEPENDENT_AMBULATORY_CARE_PROVIDER_SITE_OTHER): Payer: Medicare Other | Admitting: Family Medicine

## 2023-05-06 VITALS — BP 112/66 | HR 76 | Temp 97.5°F | Ht 72.0 in | Wt 209.2 lb

## 2023-05-06 DIAGNOSIS — J449 Chronic obstructive pulmonary disease, unspecified: Secondary | ICD-10-CM | POA: Diagnosis not present

## 2023-05-06 DIAGNOSIS — R5381 Other malaise: Secondary | ICD-10-CM

## 2023-05-06 DIAGNOSIS — Z7901 Long term (current) use of anticoagulants: Secondary | ICD-10-CM | POA: Diagnosis not present

## 2023-05-06 DIAGNOSIS — Z7984 Long term (current) use of oral hypoglycemic drugs: Secondary | ICD-10-CM

## 2023-05-06 DIAGNOSIS — S30860D Insect bite (nonvenomous) of lower back and pelvis, subsequent encounter: Secondary | ICD-10-CM | POA: Diagnosis not present

## 2023-05-06 DIAGNOSIS — R0609 Other forms of dyspnea: Secondary | ICD-10-CM

## 2023-05-06 DIAGNOSIS — R5383 Other fatigue: Secondary | ICD-10-CM

## 2023-05-06 DIAGNOSIS — E1140 Type 2 diabetes mellitus with diabetic neuropathy, unspecified: Secondary | ICD-10-CM | POA: Diagnosis not present

## 2023-05-06 DIAGNOSIS — R2681 Unsteadiness on feet: Secondary | ICD-10-CM

## 2023-05-06 DIAGNOSIS — W57XXXD Bitten or stung by nonvenomous insect and other nonvenomous arthropods, subsequent encounter: Secondary | ICD-10-CM | POA: Diagnosis not present

## 2023-05-06 DIAGNOSIS — R0602 Shortness of breath: Secondary | ICD-10-CM | POA: Diagnosis not present

## 2023-05-06 DIAGNOSIS — J439 Emphysema, unspecified: Secondary | ICD-10-CM | POA: Diagnosis not present

## 2023-05-06 DIAGNOSIS — I480 Paroxysmal atrial fibrillation: Secondary | ICD-10-CM

## 2023-05-06 LAB — COMPREHENSIVE METABOLIC PANEL
ALT: 9 U/L (ref 0–53)
AST: 11 U/L (ref 0–37)
Albumin: 3.6 g/dL (ref 3.5–5.2)
Alkaline Phosphatase: 76 U/L (ref 39–117)
BUN: 13 mg/dL (ref 6–23)
CO2: 30 mEq/L (ref 19–32)
Calcium: 8.6 mg/dL (ref 8.4–10.5)
Chloride: 102 mEq/L (ref 96–112)
Creatinine, Ser: 0.89 mg/dL (ref 0.40–1.50)
GFR: 81.79 mL/min (ref >60.00)
Glucose, Bld: 204 mg/dL — ABNORMAL HIGH (ref 70–99)
Potassium: 3.9 mEq/L (ref 3.5–5.1)
Sodium: 140 mEq/L (ref 135–145)
Total Bilirubin: 0.5 mg/dL (ref 0.2–1.2)
Total Protein: 6.3 g/dL (ref 6.0–8.3)

## 2023-05-06 LAB — CBC WITH DIFFERENTIAL/PLATELET
Basophils Absolute: 0 10*3/uL (ref 0.0–0.1)
Basophils Relative: 0.5 % (ref 0.0–3.0)
Eosinophils Absolute: 0.1 10*3/uL (ref 0.0–0.7)
Eosinophils Relative: 1.9 % (ref 0.0–5.0)
HCT: 43.4 % (ref 39.0–52.0)
Hemoglobin: 14 g/dL (ref 13.0–17.0)
Lymphocytes Relative: 22.1 % (ref 12.0–46.0)
Lymphs Abs: 1.6 10*3/uL (ref 0.7–4.0)
MCHC: 32.3 g/dL (ref 30.0–36.0)
MCV: 97.1 fl (ref 78.0–100.0)
Monocytes Absolute: 0.5 10*3/uL (ref 0.1–1.0)
Monocytes Relative: 6.5 % (ref 3.0–12.0)
Neutro Abs: 4.9 10*3/uL (ref 1.4–7.7)
Neutrophils Relative %: 69 % (ref 43.0–77.0)
Platelets: 217 10*3/uL (ref 150.0–400.0)
RBC: 4.47 Mil/uL (ref 4.22–5.81)
RDW: 14.1 % (ref 11.5–15.5)
WBC: 7.1 10*3/uL (ref 4.0–10.5)

## 2023-05-06 LAB — HEMOGLOBIN A1C: Hgb A1c MFr Bld: 8.1 % — ABNORMAL HIGH (ref 4.6–6.5)

## 2023-05-06 LAB — TSH: TSH: 1.4 u[IU]/mL (ref 0.35–5.50)

## 2023-05-06 MED ORDER — DICLOFENAC SODIUM 1 % EX GEL: 4.0000 g | Freq: Three times a day (TID) | CUTANEOUS | 3 refills | Status: AC

## 2023-05-06 MED ORDER — DOXYCYCLINE HYCLATE 100 MG PO TABS
100.0000 mg | ORAL_TABLET | Freq: Two times a day (BID) | ORAL | 0 refills | Status: DC
Start: 2023-05-06 — End: 2023-05-29

## 2023-05-06 NOTE — Progress Notes (Unsigned)
Ph: [phone] Fax: [phone]   Patient ID: [name], male    DOB: 1946/05/31, 77 y.o.   MRN: [id number]  This visit was conducted in person.  BP 112/66   Pulse 76   Temp (!) 97.5 F (36.4 C) (Temporal)   Ht 6' (1.829 m)   Wt 209 lb 4 oz (94.9 kg)   SpO2 98%   BMI 28.38 kg/m    CC: dyspnea Subjective:   HPI: [name] is a 77 y.o. male presenting on 05/06/2023 for Shortness of Breath (C/o SOB off and on. Started about 1 wk ago. Pt accompanied by wife, [name]. )   3 wk h/o intermittent dyspnea that comes and goes associated with rhinorrhea x 1.5 wks. No cough, chest pain. No new leg swelling. Ongoing lower back pain, lower legs feel weak. No orthopnea. Predominantly exertional dyspnea. Claritin/zyrtec helped rhinorrhea. Noted increased cough recently.   Hasn't felt well since tick bite last month to left upper leg - itchy.  No fevers/chills, new rash, new joint pains, headache, abd pain, nausea.  No urinary symptoms.   He's not taking amiodarone.  Lab Results  Component Value Date   HGBA1C 9.7 (A) 01/16/2023       Relevant past medical, surgical, family and social history reviewed and updated as indicated. Interim medical history since our last visit reviewed. Allergies and medications reviewed and updated. Outpatient Medications Prior to Visit  Medication Sig Dispense Refill   albuterol (VENTOLIN HFA) 108 (90 Base) MCG/ACT inhaler Inhale 2 puffs into the lungs every 6 (six) hours as needed for wheezing or shortness of breath. 18 g 3   amiodarone (PACERONE) 200 MG tablet Take 200 mg by mouth as needed. Take for fast heart rate>100     aspirin 81 MG EC tablet Take 1 tablet (81 mg total) by mouth daily. Swallow whole.     Budeson-Glycopyrrol-Formoterol (BREZTRI AEROSPHERE) 160-9-4.8 MCG/ACT AERO Inhale 2 puffs into the lungs in the morning and at bedtime. 3 each 3   Cholecalciferol (VITAMIN D) 50 MCG (2000 UT) CAPS Take 1 capsule (2,000 Units total) by mouth  daily. 30 capsule    Coenzyme Q10 (COQ-10 PO) Take 1 tablet by mouth daily.     Cyanocobalamin (B-12) 1000 MCG SUBL Place 1 tablet under the tongue every Monday, Wednesday, and Friday. 30 tablet    diclofenac Sodium (VOLTAREN) 1 % GEL Apply 4 g topically 3 (three) times daily. (Patient taking differently: Apply 4 g topically 3 (three) times daily. As needed) 100 g 3   diltiazem (CARDIZEM) 30 MG tablet TAKE 1 TABLET(30 MG) BY MOUTH DAILY AS NEEDED FOR FAST HEART RATE 30 tablet 5   diphenoxylate-atropine (LOMOTIL) 2.5-0.025 MG tablet Take 1 tablet by mouth 2 (two) times daily as needed. 20 tablet 0   empagliflozin (JARDIANCE) 10 MG TABS tablet Take 1 tablet (10 mg total) by mouth daily before breakfast. 30 tablet 3   Ferrous Sulfate (IRON) 325 (65 Fe) MG TABS Take by mouth daily.     fish oil-omega-3 fatty acids 1000 MG capsule Take 2 g by mouth daily.     furosemide (LASIX) 20 MG tablet TAKE 1 TABLET BY MOUTH EVERY DAY AS NEEDED FOR FLUID RETENTION 90 tablet 0   gabapentin (NEURONTIN) 600 MG tablet TAKE 1 TABLET(600 MG) BY MOUTH TWICE DAILY 180 tablet 1   ipratropium-albuterol (DUONEB) 0.5-2.5 (3) MG/3ML SOLN Take 3 mLs by nebulization every 6 (six) hours as needed. 360 mL 6   loratadine (  CLARITIN) 10 MG tablet Take 1 tablet (10 mg total) by mouth daily.     metFORMIN (GLUCOPHAGE-XR) 500 MG 24 hr tablet Take 1 tablet (500 mg total) by mouth 2 (two) times daily with a meal. 180 tablet 0   methocarbamol (ROBAXIN) 500 MG tablet Take 1 tablet (500 mg total) by mouth 2 (two) times daily as needed for muscle spasms (sedation precautions). 180 tablet 0   metoprolol tartrate (LOPRESSOR) 25 MG tablet TAKE 1 TABLET BY MOUTH TWICE DAILY FOR FAST HEART RATE>100 180 tablet 0   midodrine (PROAMATINE) 10 MG tablet Take 1 tablet (10 mg total) by mouth 3 (three) times daily as needed. 90 tablet 1   mupirocin cream (BACTROBAN) 2 % Apply 1 Application topically 2 (two) times daily. To affected wound on leg 15 g 0    nitroGLYCERIN (NITROLINGUAL) 0.4 MG/SPRAY spray USE 1 SPRAY AS DIRECTED EVERY 5 MINUTES AS NEEDED 4.9 g 1   omeprazole (PRILOSEC) 40 MG capsule TAKE 1 CAPSULE(40 MG) BY MOUTH DAILY 90 capsule 1   pravastatin (PRAVACHOL) 20 MG tablet TAKE 1 TABLET(20 MG) BY MOUTH DAILY 90 tablet 3   spironolactone (ALDACTONE) 25 MG tablet Take 0.5 tablets (12.5 mg total) by mouth daily. 45 tablet 1   triamcinolone cream (KENALOG) 0.5 % Apply 1 Application topically 2 (two) times daily. 30 g 0   warfarin (COUMADIN) 2.5 MG tablet TAKE 1&1/2 TABLETS BY MOUTH DAILY OR AS DIRECTED AFTER ANTI-COAG CLINIC 150 tablet 0   HYDROcodone-acetaminophen (NORCO) 10-325 MG tablet Take 1 tablet by mouth every 6 (six) hours as needed for moderate pain or severe pain. 120 tablet 0   No facility-administered medications prior to visit.     Per HPI unless specifically indicated in ROS section below Review of Systems  Objective:  BP 112/66   Pulse 76   Temp (!) 97.5 F (36.4 C) (Temporal)   Ht 6' (1.829 m)   Wt 209 lb 4 oz (94.9 kg)   SpO2 98%   BMI 28.38 kg/m   Wt Readings from Last 3 Encounters:  05/06/23 209 lb 4 oz (94.9 kg)  03/23/23 208 lb (94.3 kg)  03/17/23 215 lb (97.5 kg)      Physical Exam Vitals and nursing note reviewed.  Constitutional:      Appearance: Normal appearance. He is not ill-appearing.  HENT:     Mouth/Throat:     Mouth: Mucous membranes are moist.     Pharynx: Oropharynx is clear. No oropharyngeal exudate or posterior oropharyngeal erythema.  Eyes:     Extraocular Movements: Extraocular movements intact.     Pupils: Pupils are equal, round, and reactive to light.  Cardiovascular:     Rate and Rhythm: Normal rate and regular rhythm.     Pulses: Normal pulses.     Heart sounds: Normal heart sounds. No murmur heard. Pulmonary:     Effort: Pulmonary effort is normal. No respiratory distress.     Breath sounds: No wheezing, rhonchi or rales.     Comments: Coarse breath sounds  bibasilarly Abdominal:     General: Bowel sounds are normal. There is no distension.     Palpations: Abdomen is soft. There is no mass.     Tenderness: There is no abdominal tenderness. There is no guarding or rebound.     Hernia: No hernia is present.  Musculoskeletal:     Right lower leg: No edema.     Left lower leg: No edema.  Skin:    General:  Skin is warm and dry.     Findings: No rash.     Comments: Residual tick bite to left posterior thigh/buttock with surrounding postinflammatory hyperpigmentation without erythema or rash  Neurological:     Mental Status: He is alert.     Comments:  5/5 strength BLE, grip strength intact  Psychiatric:        Mood and Affect: Mood normal.        Behavior: Behavior normal.       Results for orders placed or performed in visit on 04/08/23  POCT INR  Result Value Ref Range   INR 2.8 2.0 - 3.0   POC INR     *Note: Due to a large number of results and/or encounters for the requested time period, some results have not been displayed. A complete set of results can be found in Results Review.   Lab Results  Component Value Date   TSH 3.24 12/11/2021    Assessment & Plan:   Problem List Items Addressed This Visit     Tick bite of buttock   Other Visit Diagnoses     Shortness of breath    -  Primary   Relevant Orders   DG Chest 2 View        No orders of the defined types were placed in this encounter.   Orders Placed This Encounter  Procedures   DG Chest 2 View    Standing Status:   Future    Standing Expiration Date:   05/05/2024    Order Specific Question:   Reason for Exam (SYMPTOM  OR DIAGNOSIS REQUIRED)    Answer:   intermittent dyspnea, h/o COPD    Order Specific Question:   Preferred imaging location?    Answer:   [facility]    There are no Patient Instructions on file for this visit.  Follow up plan: No follow-ups on file.  Eustaquio Boyden, MD

## 2023-05-06 NOTE — Patient Instructions (Addendum)
Labs today, chest xray today.  We will be in touch with results.  Continue Breztri 2 puffs twice daily with albuterol as needed.  Take doxycycline 7d course sent to pharmacy.

## 2023-05-07 ENCOUNTER — Telehealth: Payer: Self-pay

## 2023-05-07 MED ORDER — CYCLOBENZAPRINE HCL 5 MG PO TABS: 5.0000 mg | ORAL_TABLET | Freq: Three times a day (TID) | ORAL | 0 refills | Status: DC | PRN

## 2023-05-07 MED ORDER — CYCLOBENZAPRINE HCL 5 MG PO TABS: 5.0000 mg | ORAL_TABLET | Freq: Two times a day (BID) | ORAL | 0 refills | Status: DC | PRN

## 2023-05-07 NOTE — Telephone Encounter (Signed)
Methocarbamol not covered by plan,   CHLORZOXAZON TAB 500MG ;CYCLOBENZAPR TAB 5MG ;TIZANIDINE TABS;FLURBIPROFEN;IBUPROFEN PREF`D

## 2023-05-07 NOTE — Assessment & Plan Note (Signed)
Worsened malaise, fatigue for the past 2 months since sustaining tick bite to left buttock. No rash or fever - not consistent with RMSF.  No significant new joint pain, HA, abd pain, nausea pointing against other tick borne illness. Will check for Lyme disease labs today. If positive, low threshold to extend doxycycline course.

## 2023-05-07 NOTE — Telephone Encounter (Signed)
I left patient a message informing him to eat more greens every other day while taking the Doxycycline medication for 7 days.

## 2023-05-07 NOTE — Assessment & Plan Note (Addendum)
Notes worsening dyspnea, now with cough.  Check CXR - overall clear on my read, await radiology eval.  With increased dyspnea, cough, sputum production, will Rx doxycycline for possible COPD exacerbation while we await lyme disease testing as per above. Avoid steroid at this time in diabetic without significant wheezing on exam.

## 2023-05-07 NOTE — Telephone Encounter (Signed)
Spoke with pt relaying Dr. G's message. Pt verbalizes understanding.  

## 2023-05-07 NOTE — Assessment & Plan Note (Signed)
Discussed Breztri use - 2 puffs twice daily. He'd been taking up to 6 puffs/day.

## 2023-05-07 NOTE — Assessment & Plan Note (Signed)
Sounds regular today.  Update TSH - unclear if he's taking amiodarone.

## 2023-05-07 NOTE — Assessment & Plan Note (Signed)
Will notify cardiology coumadin clinic that we're starting 1 wk doxy course to titrate coumadin dosing accordingly.

## 2023-05-07 NOTE — Assessment & Plan Note (Signed)
Generalized malaise after tick bit sustained 2 months ago.  Check for lyme disease. Start doxy 1 wk course as per above.

## 2023-05-07 NOTE — Assessment & Plan Note (Signed)
Chronic, on metformin XR 500mg  bid. He stopped Jardiance, stating he doesn't want anything that will increase urination.  Update A1c.

## 2023-05-07 NOTE — Telephone Encounter (Signed)
Plz notify methocarbamol is no longer covered by insurance. I've sent in flexeril 5mg  to use BID PRN with sedation precautions to try this for muscle relaxant.

## 2023-05-08 DIAGNOSIS — H25043 Posterior subcapsular polar age-related cataract, bilateral: Secondary | ICD-10-CM | POA: Diagnosis not present

## 2023-05-08 DIAGNOSIS — H2513 Age-related nuclear cataract, bilateral: Secondary | ICD-10-CM | POA: Diagnosis not present

## 2023-05-08 DIAGNOSIS — H2512 Age-related nuclear cataract, left eye: Secondary | ICD-10-CM | POA: Diagnosis not present

## 2023-05-08 DIAGNOSIS — H25013 Cortical age-related cataract, bilateral: Secondary | ICD-10-CM | POA: Diagnosis not present

## 2023-05-08 DIAGNOSIS — E119 Type 2 diabetes mellitus without complications: Secondary | ICD-10-CM | POA: Diagnosis not present

## 2023-05-08 LAB — B. BURGDORFI ANTIBODIES BY WB

## 2023-05-13 ENCOUNTER — Other Ambulatory Visit: Payer: Self-pay | Admitting: Family Medicine

## 2023-05-13 DIAGNOSIS — G894 Chronic pain syndrome: Secondary | ICD-10-CM

## 2023-05-14 NOTE — Telephone Encounter (Addendum)
Message from pt via pharmacy:  Hi Dr. Reece Agar. I am requesting refill authorization because I am near empty. Thank you.   Name of Medication: Hydrocodone-APAP Name of Pharmacy: [facility] Last Fill or Written Date and Quantity: 03/19/23, #120 Last Office Visit and Type: 05/06/23, SOB Next Office Visit and Type: none Last Controlled Substance Agreement Date: 02/03/18 Last UDS:  07/06/17

## 2023-05-17 MED ORDER — HYDROCODONE-ACETAMINOPHEN 10-325 MG PO TABS: 1.0000 | ORAL_TABLET | Freq: Four times a day (QID) | ORAL | 0 refills | Status: DC | PRN

## 2023-05-17 NOTE — Telephone Encounter (Signed)
ERx 

## 2023-05-20 ENCOUNTER — Ambulatory Visit: Payer: Medicare Other | Attending: Cardiovascular Disease

## 2023-05-20 DIAGNOSIS — I4891 Unspecified atrial fibrillation: Secondary | ICD-10-CM | POA: Diagnosis not present

## 2023-05-20 DIAGNOSIS — Z5181 Encounter for therapeutic drug level monitoring: Secondary | ICD-10-CM | POA: Insufficient documentation

## 2023-05-20 LAB — POCT INR: INR: 2.4 (ref 2.0–3.0)

## 2023-05-20 NOTE — Patient Instructions (Signed)
Description   Continue on same dosage of Warfarin 1.5 tablets daily. Recheck INR in 6 weeks

## 2023-05-25 ENCOUNTER — Ambulatory Visit: Payer: Medicare Other | Admitting: Family Medicine

## 2023-05-25 ENCOUNTER — Encounter: Payer: Self-pay | Admitting: Family Medicine

## 2023-05-25 VITALS — BP 102/68 | HR 69 | Temp 97.5°F | Ht 72.0 in | Wt 208.0 lb

## 2023-05-25 DIAGNOSIS — R300 Dysuria: Secondary | ICD-10-CM | POA: Insufficient documentation

## 2023-05-25 NOTE — Assessment & Plan Note (Addendum)
Pt was unable to give a sample  Frustrated since I did not know his history (evidently this is chronic) and did not want to have a visit (would not answer further questions) He then left without further conversation  I encouraged him to re schedule appt with Dr Reece Agar
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
## 2023-05-29 ENCOUNTER — Encounter: Payer: Self-pay | Admitting: Family Medicine

## 2023-05-29 ENCOUNTER — Ambulatory Visit (INDEPENDENT_AMBULATORY_CARE_PROVIDER_SITE_OTHER): Payer: Medicare Other | Admitting: Family Medicine

## 2023-05-29 VITALS — BP 118/80 | HR 77 | Temp 97.9°F | Ht 72.0 in | Wt 208.0 lb

## 2023-05-29 DIAGNOSIS — J449 Chronic obstructive pulmonary disease, unspecified: Secondary | ICD-10-CM | POA: Diagnosis not present

## 2023-05-29 DIAGNOSIS — R0609 Other forms of dyspnea: Secondary | ICD-10-CM | POA: Diagnosis not present

## 2023-05-29 DIAGNOSIS — R0602 Shortness of breath: Secondary | ICD-10-CM

## 2023-05-29 DIAGNOSIS — G8929 Other chronic pain: Secondary | ICD-10-CM

## 2023-05-29 DIAGNOSIS — E1140 Type 2 diabetes mellitus with diabetic neuropathy, unspecified: Secondary | ICD-10-CM | POA: Diagnosis not present

## 2023-05-29 DIAGNOSIS — Z7901 Long term (current) use of anticoagulants: Secondary | ICD-10-CM

## 2023-05-29 DIAGNOSIS — R300 Dysuria: Secondary | ICD-10-CM

## 2023-05-29 LAB — POC URINALSYSI DIPSTICK (AUTOMATED)
Bilirubin, UA: NEGATIVE
Glucose, UA: POSITIVE — AB
Ketones, UA: NEGATIVE
Nitrite, UA: NEGATIVE
Protein, UA: POSITIVE — AB
Spec Grav, UA: 1.01 (ref 1.010–1.025)
Urobilinogen, UA: 0.2 E.U./dL
pH, UA: 7.5 (ref 5.0–8.0)

## 2023-05-29 MED ORDER — DOXYCYCLINE HYCLATE 100 MG PO TABS: 100.0000 mg | ORAL_TABLET | Freq: Two times a day (BID) | ORAL | 0 refills | Status: DC

## 2023-05-29 MED ORDER — IPRATROPIUM-ALBUTEROL 0.5-2.5 (3) MG/3ML IN SOLN
3.0000 mL | Freq: Once | RESPIRATORY_TRACT | Status: AC
Start: 2023-05-29 — End: 2023-05-29
  Administered 2023-05-29: 3 mL via RESPIRATORY_TRACT

## 2023-05-29 MED ORDER — PREDNISONE 10 MG PO TABS: ORAL_TABLET | ORAL | 0 refills | Status: DC

## 2023-05-29 MED ORDER — CEFDINIR 300 MG PO CAPS: 300.0000 mg | ORAL_CAPSULE | Freq: Two times a day (BID) | ORAL | 0 refills | Status: AC

## 2023-05-29 NOTE — Progress Notes (Unsigned)
Ph: [phone] Fax: [phone]   Patient ID: [name], male    DOB: 1946-09-02, 77 y.o.   MRN: [id number]  This visit was conducted in person.  BP 118/80   Pulse 77   Temp 97.9 F (36.6 C) (Temporal)   Ht 6' (1.829 m)   Wt 208 lb (94.3 kg) Comment: Per pt. Declined to weigh today.  SpO2 98%   BMI 28.21 kg/m    CC: dysuria, shortness of breath Subjective:   HPI: [name] is a 77 y.o. male presenting on 05/29/2023 for Dysuria (C/o pain when urinating. Sxs started about 2 wks ago. Pt accompanied by wife, [name]. ) and Shortness of Breath (C/o ongoing SOB since recent tick bite. )   2 wk h/o dysuria, worse at end of stream. Chronic urinary urgency, worse frequency and nocturia. No blood in urine, flank pain, lower abd pain, fevers, nausea/vomiting.  Seen earlier in the week by my partner, unable to leave a sample and left refusing to be seen.   Ongoing dyspnea. May have started after tick bite sustained 03/2023. Last visit we treated for COPD exacerbation with doxycycline course. Avoiding steroids in diabetic given no significant wheezing on exam. CXR was unrevealing. Known COPD - previously overusing Breztri, now taking 2 puffs BID. Uses albuterol inh/neb 2-3 puffs TID. No increase in cough, occ sputum production at night, significant wheezing at night.   Upcoming cataract surgery 06/2023, 07/2023 by Dr Wynelle Link.      Relevant past medical, surgical, family and social history reviewed and updated as indicated. Interim medical history since our last visit reviewed. Allergies and medications reviewed and updated. Outpatient Medications Prior to Visit  Medication Sig Dispense Refill   albuterol (VENTOLIN HFA) 108 (90 Base) MCG/ACT inhaler Inhale 2 puffs into the lungs every 6 (six) hours as needed for wheezing or shortness of breath. 18 g 3   amiodarone (PACERONE) 200 MG tablet Take 200 mg by mouth as needed. Take for fast heart rate>100     aspirin 81 MG EC tablet Take 1  tablet (81 mg total) by mouth daily. Swallow whole.     Budeson-Glycopyrrol-Formoterol (BREZTRI AEROSPHERE) 160-9-4.8 MCG/ACT AERO Inhale 2 puffs into the lungs in the morning and at bedtime. 3 each 3   Cholecalciferol (VITAMIN D) 50 MCG (2000 UT) CAPS Take 1 capsule (2,000 Units total) by mouth daily. 30 capsule    Coenzyme Q10 (COQ-10 PO) Take 1 tablet by mouth daily.     Cyanocobalamin (B-12) 1000 MCG SUBL Place 1 tablet under the tongue every Monday, Wednesday, and Friday. 30 tablet    cyclobenzaprine (FLEXERIL) 5 MG tablet Take 1 tablet (5 mg total) by mouth 2 (two) times daily as needed for muscle spasms (sedation precautions). 30 tablet 0   diclofenac Sodium (VOLTAREN) 1 % GEL Apply 4 g topically 3 (three) times daily. As needed 100 g 3   diltiazem (CARDIZEM) 30 MG tablet TAKE 1 TABLET(30 MG) BY MOUTH DAILY AS NEEDED FOR FAST HEART RATE 30 tablet 5   diphenoxylate-atropine (LOMOTIL) 2.5-0.025 MG tablet Take 1 tablet by mouth 2 (two) times daily as needed. 20 tablet 0   empagliflozin (JARDIANCE) 10 MG TABS tablet Take 1 tablet (10 mg total) by mouth daily before breakfast. 30 tablet 3   Ferrous Sulfate (IRON) 325 (65 Fe) MG TABS Take by mouth daily.     fish oil-omega-3 fatty acids 1000 MG capsule Take 2 g by mouth daily.     furosemide (LASIX)  20 MG tablet TAKE 1 TABLET BY MOUTH EVERY DAY AS NEEDED FOR FLUID RETENTION 90 tablet 0   gabapentin (NEURONTIN) 600 MG tablet TAKE 1 TABLET(600 MG) BY MOUTH TWICE DAILY 180 tablet 1   HYDROcodone-acetaminophen (NORCO) 10-325 MG tablet Take 1 tablet by mouth every 6 (six) hours as needed for moderate pain or severe pain. 120 tablet 0   ipratropium-albuterol (DUONEB) 0.5-2.5 (3) MG/3ML SOLN Take 3 mLs by nebulization every 6 (six) hours as needed. 360 mL 6   loratadine (CLARITIN) 10 MG tablet Take 1 tablet (10 mg total) by mouth daily.     metFORMIN (GLUCOPHAGE-XR) 500 MG 24 hr tablet Take 1 tablet (500 mg total) by mouth 2 (two) times daily with a meal.  180 tablet 0   metoprolol tartrate (LOPRESSOR) 25 MG tablet TAKE 1 TABLET BY MOUTH TWICE DAILY FOR FAST HEART RATE>100 180 tablet 0   midodrine (PROAMATINE) 10 MG tablet Take 1 tablet (10 mg total) by mouth 3 (three) times daily as needed. 90 tablet 1   mupirocin cream (BACTROBAN) 2 % Apply 1 Application topically 2 (two) times daily. To affected wound on leg 15 g 0   nitroGLYCERIN (NITROLINGUAL) 0.4 MG/SPRAY spray USE 1 SPRAY AS DIRECTED EVERY 5 MINUTES AS NEEDED 4.9 g 1   omeprazole (PRILOSEC) 40 MG capsule TAKE 1 CAPSULE(40 MG) BY MOUTH DAILY 90 capsule 1   pravastatin (PRAVACHOL) 20 MG tablet TAKE 1 TABLET(20 MG) BY MOUTH DAILY 90 tablet 3   spironolactone (ALDACTONE) 25 MG tablet Take 0.5 tablets (12.5 mg total) by mouth daily. 45 tablet 1   triamcinolone cream (KENALOG) 0.5 % Apply 1 Application topically 2 (two) times daily. 30 g 0   warfarin (COUMADIN) 2.5 MG tablet TAKE 1&1/2 TABLETS BY MOUTH DAILY OR AS DIRECTED AFTER ANTI-COAG CLINIC 150 tablet 0   doxycycline (VIBRA-TABS) 100 MG tablet Take 1 tablet (100 mg total) by mouth 2 (two) times daily. 14 tablet 0   No facility-administered medications prior to visit.     Per HPI unless specifically indicated in ROS section below Review of Systems  Objective:  BP 118/80   Pulse 77   Temp 97.9 F (36.6 C) (Temporal)   Ht 6' (1.829 m)   Wt 208 lb (94.3 kg) Comment: Per pt. Declined to weigh today.  SpO2 98%   BMI 28.21 kg/m   Wt Readings from Last 3 Encounters:  05/29/23 208 lb (94.3 kg)  05/25/23 208 lb (94.3 kg)  05/06/23 209 lb 4 oz (94.9 kg)      Physical Exam Vitals and nursing note reviewed.  Constitutional:      Appearance: Normal appearance. He is not ill-appearing.  HENT:     Head: Normocephalic and atraumatic.     Mouth/Throat:     Mouth: Mucous membranes are moist.     Pharynx: Oropharynx is clear. No oropharyngeal exudate or posterior oropharyngeal erythema.  Cardiovascular:     Rate and Rhythm: Normal rate  and regular rhythm.     Pulses: Normal pulses.     Heart sounds: Normal heart sounds. No murmur heard. Pulmonary:     Effort: Pulmonary effort is normal. No respiratory distress.     Breath sounds: No wheezing or rhonchi.     Comments: Decreased breath sounds RLL, coarse crackles at that area, otherwise clear lungs without wheezing. Mild improvement in air movement after breathing treatment  Abdominal:     General: There is no distension.     Palpations: Abdomen is soft.  There is no mass.     Tenderness: There is no abdominal tenderness. There is no guarding or rebound.     Hernia: No hernia is present. There is no hernia in the right inguinal area.  Genitourinary:    Penis: Normal.      Testes: Normal.        Right: Mass not present.        Left: Mass not present.     Comments: No penile or scrotal skin changes  Musculoskeletal:     Right lower leg: No edema.     Left lower leg: No edema.  Lymphadenopathy:     Lower Body: No right inguinal adenopathy.  Skin:    General: Skin is warm and dry.  Neurological:     Mental Status: He is alert.  Psychiatric:        Mood and Affect: Mood normal.        Behavior: Behavior normal.       Results for orders placed or performed in visit on 05/29/23  Urine Culture   Specimen: Urine  Result Value Ref Range   MICRO NUMBER: [id number]    SPECIMEN QUALITY: Adequate    Sample Source URINE    STATUS: FINAL    Result:      Mixed genital flora isolated. These superficial bacteria are not indicative of a urinary tract infection. No further organism identification is warranted on this specimen. If clinically indicated, recollect clean-catch, mid-stream urine and transfer  immediately to Urine Culture Transport Tube.   POCT Urinalysis Dipstick (Automated)  Result Value Ref Range   Color, UA yellow    Clarity, UA cloudy    Glucose, UA Positive (A) Negative   Bilirubin, UA negative    Ketones, UA negative    Spec Grav, UA 1.010 1.010 - 1.025    Blood, UA +/-    pH, UA 7.5 5.0 - 8.0   Protein, UA Positive (A) Negative   Urobilinogen, UA 0.2 0.2 or 1.0 E.U./dL   Nitrite, UA negative    Leukocytes, UA Moderate (2+) (A) Negative   *Note: Due to a large number of results and/or encounters for the requested time period, some results have not been displayed. A complete set of results can be found in Results Review.   DG Chest 2 View CLINICAL DATA:  77 year old male with COPD  EXAM: CHEST - 2 VIEW  COMPARISON:  12/25/2021  FINDINGS: Cardiomediastinal silhouette unchanged in size and contour. No evidence of central vascular congestion. No interlobular septal thickening.  Stigmata of emphysema, with increased retrosternal airspace, flattened hemidiaphragms, increased AP diameter, and hyperinflation on the AP view.  No pneumothorax or pleural effusion. Coarsened interstitial markings, with no confluent airspace disease.  No acute displaced fracture. Degenerative changes of the spine.  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  IMPRESSION: Emphysema, without evidence of acute cardiopulmonary disease  Electronically Signed   By: Gilmer Mor D.O.   On: 05/07/2023 09:27  Assessment & Plan:   Problem List Items Addressed This Visit     Encounter for chronic pain management (Chronic)    [place] CSRS reviewed.       COPD mixed type (HCC)    Concern for current COPD exacerbation vs worsening baseline COPD given ongoing dyspnea associated with subjective wheezing.  No fever, or significant increase in cough or sputum production.  Already on Breztri - doesn't feel this is beneficial.  Ambulatory pulse ox today without significant desaturation (maintains 90s). Does not seem  to qualify for home oxygen at this time.  Rx today with doxycycline + cefdinir course, add prednisone taper.  Consider low dose prednisone if beneficial.  Short term f/u 2-3 wks       Relevant Medications   predniSONE (DELTASONE) 10 MG  tablet   Dyspnea on exertion    Ongoing progressive worsening shortness of breath. ?worsening COPD. Not consistent with CHF exacerbation.  He has diminished breath sounds to RLL, however recent CXR last month was reassuring.  Regardless Rx antibiotic + prednisone to cover possible lung infection.       Type 2 diabetes mellitus with diabetic neuropathy (HCC)    Discussed monitoring for hyperglycemia while on prednisone taper      Current use of long term anticoagulation (Coumadin)    I will notify coumadin cardiology clinic that I've restarted doxycycline + cefdinir course to treat concomitant UTI and COPD exacerbation.  I did ask patient to increase greens while on antibiotics.       Dysuria - Primary    Concern for infection given abnormal UA - send culture.  Rx omnicef 300mg  BID 7d course.  Update if not improving with treatment.       Relevant Orders   POCT Urinalysis Dipstick (Automated) (Completed)   Urine Culture (Completed)     Meds ordered this encounter  Medications   ipratropium-albuterol (DUONEB) 0.5-2.5 (3) MG/3ML nebulizer solution 3 mL   predniSONE (DELTASONE) 10 MG tablet    Sig: Take three tablets for 3 days followed by two tablets for 3 days followed by one tablet for 3 days    Dispense:  18 tablet    Refill:  0   cefdinir (OMNICEF) 300 MG capsule    Sig: Take 1 capsule (300 mg total) by mouth 2 (two) times daily for 7 days.    Dispense:  14 capsule    Refill:  0   doxycycline (VIBRA-TABS) 100 MG tablet    Sig: Take 1 tablet (100 mg total) by mouth 2 (two) times daily.    Dispense:  14 tablet    Refill:  0    Orders Placed This Encounter  Procedures   Urine Culture   POCT Urinalysis Dipstick (Automated)    Patient Instructions  Breathing treatment today  Walking oxygen test today  Urine culture sent today.  Oxygen levels did not drop with walking.  I'd like you to take antibiotics omnicef + doxycycline + steroid taper to treat worsening COPD and  urine infection. Doxycycline antibiotic will thin your blood - increase leafy greens.  Return in 1-2 weeks for follow up visit.   Follow up plan: Return in about 2 weeks (around 06/12/2023), or if symptoms worsen or fail to improve, for follow up visit.  Eustaquio Boyden, MD

## 2023-05-29 NOTE — Patient Instructions (Addendum)
Breathing treatment today  Walking oxygen test today  Urine culture sent today.  Oxygen levels did not drop with walking.  I'd like you to take antibiotics omnicef + doxycycline + steroid taper to treat worsening COPD and urine infection. Doxycycline antibiotic will thin your blood - increase leafy greens.  Return in 1-2 weeks for follow up visit.

## 2023-05-30 LAB — URINE CULTURE
MICRO NUMBER:: 15112564
SPECIMEN QUALITY:: ADEQUATE

## 2023-05-31 NOTE — Progress Notes (Incomplete)
Ph: [phone] Fax: [phone]   Patient ID: [name], male    DOB: 05-Aug-1946, 77 y.o.   MRN: [id number]  This visit was conducted in person.  BP 118/80   Pulse 77   Temp 97.9 F (36.6 C) (Temporal)   Ht 6' (1.829 m)   Wt 208 lb (94.3 kg) Comment: Per pt. Declined to weigh today.  SpO2 98%   BMI 28.21 kg/m    CC: dysuria, shortness of breath Subjective:   HPI: REAL CONA is a 77 y.o. male presenting on 05/29/2023 for Dysuria (C/o pain when urinating. Sxs started about 2 wks ago. Pt accompanied by wife, [name]. ) and Shortness of Breath (C/o ongoing SOB since recent tick bite. )   2 wk h/o dysuria, worse at end of stream. Chronic urinary urgency, worse frequency and nocturia. No blood in urine, flank pain, lower abd pain, fevers, nausea/vomiting.  Seen earlier in the week by my partner, unable to leave a sample and left refusing to be seen.   Ongoing dyspnea. May have started after tick bite sustained 03/2023. Last visit we treated for COPD exacerbation with doxycycline course. Avoiding steroids in diabetic given no significant wheezing on exam. CXR was unrevealing. Known COPD - previously overusing Breztri, now taking 2 puffs BID. Uses albuterol inh/neb 2-3 puffs TID. No increase in cough, occ sputum production at night, significant wheezing at night.   Upcoming cataract surgery 06/2023, 07/2023 by Dr Wynelle Link.      Relevant past medical, surgical, family and social history reviewed and updated as indicated. Interim medical history since our last visit reviewed. Allergies and medications reviewed and updated. Outpatient Medications Prior to Visit  Medication Sig Dispense Refill  . albuterol (VENTOLIN HFA) 108 (90 Base) MCG/ACT inhaler Inhale 2 puffs into the lungs every 6 (six) hours as needed for wheezing or shortness of breath. 18 g 3  . amiodarone (PACERONE) 200 MG tablet Take 200 mg by mouth as needed. Take for fast heart rate>100    . aspirin 81 MG EC tablet Take 1  tablet (81 mg total) by mouth daily. Swallow whole.    . Budeson-Glycopyrrol-Formoterol (BREZTRI AEROSPHERE) 160-9-4.8 MCG/ACT AERO Inhale 2 puffs into the lungs in the morning and at bedtime. 3 each 3  . Cholecalciferol (VITAMIN D) 50 MCG (2000 UT) CAPS Take 1 capsule (2,000 Units total) by mouth daily. 30 capsule   . Coenzyme Q10 (COQ-10 PO) Take 1 tablet by mouth daily.    . Cyanocobalamin (B-12) 1000 MCG SUBL Place 1 tablet under the tongue every Monday, Wednesday, and Friday. 30 tablet   . cyclobenzaprine (FLEXERIL) 5 MG tablet Take 1 tablet (5 mg total) by mouth 2 (two) times daily as needed for muscle spasms (sedation precautions). 30 tablet 0  . diclofenac Sodium (VOLTAREN) 1 % GEL Apply 4 g topically 3 (three) times daily. As needed 100 g 3  . diltiazem (CARDIZEM) 30 MG tablet TAKE 1 TABLET(30 MG) BY MOUTH DAILY AS NEEDED FOR FAST HEART RATE 30 tablet 5  . diphenoxylate-atropine (LOMOTIL) 2.5-0.025 MG tablet Take 1 tablet by mouth 2 (two) times daily as needed. 20 tablet 0  . empagliflozin (JARDIANCE) 10 MG TABS tablet Take 1 tablet (10 mg total) by mouth daily before breakfast. 30 tablet 3  . Ferrous Sulfate (IRON) 325 (65 Fe) MG TABS Take by mouth daily.    . fish oil-omega-3 fatty acids 1000 MG capsule Take 2 g by mouth daily.    . furosemide (LASIX)  20 MG tablet TAKE 1 TABLET BY MOUTH EVERY DAY AS NEEDED FOR FLUID RETENTION 90 tablet 0  . gabapentin (NEURONTIN) 600 MG tablet TAKE 1 TABLET(600 MG) BY MOUTH TWICE DAILY 180 tablet 1  . HYDROcodone-acetaminophen (NORCO) 10-325 MG tablet Take 1 tablet by mouth every 6 (six) hours as needed for moderate pain or severe pain. 120 tablet 0  . ipratropium-albuterol (DUONEB) 0.5-2.5 (3) MG/3ML SOLN Take 3 mLs by nebulization every 6 (six) hours as needed. 360 mL 6  . loratadine (CLARITIN) 10 MG tablet Take 1 tablet (10 mg total) by mouth daily.    . metFORMIN (GLUCOPHAGE-XR) 500 MG 24 hr tablet Take 1 tablet (500 mg total) by mouth 2 (two) times  daily with a meal. 180 tablet 0  . metoprolol tartrate (LOPRESSOR) 25 MG tablet TAKE 1 TABLET BY MOUTH TWICE DAILY FOR FAST HEART RATE>100 180 tablet 0  . midodrine (PROAMATINE) 10 MG tablet Take 1 tablet (10 mg total) by mouth 3 (three) times daily as needed. 90 tablet 1  . mupirocin cream (BACTROBAN) 2 % Apply 1 Application topically 2 (two) times daily. To affected wound on leg 15 g 0  . nitroGLYCERIN (NITROLINGUAL) 0.4 MG/SPRAY spray USE 1 SPRAY AS DIRECTED EVERY 5 MINUTES AS NEEDED 4.9 g 1  . omeprazole (PRILOSEC) 40 MG capsule TAKE 1 CAPSULE(40 MG) BY MOUTH DAILY 90 capsule 1  . pravastatin (PRAVACHOL) 20 MG tablet TAKE 1 TABLET(20 MG) BY MOUTH DAILY 90 tablet 3  . spironolactone (ALDACTONE) 25 MG tablet Take 0.5 tablets (12.5 mg total) by mouth daily. 45 tablet 1  . triamcinolone cream (KENALOG) 0.5 % Apply 1 Application topically 2 (two) times daily. 30 g 0  . warfarin (COUMADIN) 2.5 MG tablet TAKE 1&1/2 TABLETS BY MOUTH DAILY OR AS DIRECTED AFTER ANTI-COAG CLINIC 150 tablet 0  . doxycycline (VIBRA-TABS) 100 MG tablet Take 1 tablet (100 mg total) by mouth 2 (two) times daily. 14 tablet 0   No facility-administered medications prior to visit.     Per HPI unless specifically indicated in ROS section below Review of Systems  Objective:  BP 118/80   Pulse 77   Temp 97.9 F (36.6 C) (Temporal)   Ht 6' (1.829 m)   Wt 208 lb (94.3 kg) Comment: Per pt. Declined to weigh today.  SpO2 98%   BMI 28.21 kg/m   Wt Readings from Last 3 Encounters:  05/29/23 208 lb (94.3 kg)  05/25/23 208 lb (94.3 kg)  05/06/23 209 lb 4 oz (94.9 kg)      Physical Exam Vitals and nursing note reviewed.  Constitutional:      Appearance: Normal appearance. He is not ill-appearing.  HENT:     Head: Normocephalic and atraumatic.     Mouth/Throat:     Mouth: Mucous membranes are moist.     Pharynx: Oropharynx is clear. No oropharyngeal exudate or posterior oropharyngeal erythema.  Cardiovascular:      Rate and Rhythm: Normal rate and regular rhythm.     Pulses: Normal pulses.     Heart sounds: Normal heart sounds. No murmur heard. Pulmonary:     Effort: Pulmonary effort is normal. No respiratory distress.     Breath sounds: No wheezing or rhonchi.     Comments: Decreased breath sounds RLL, coarse crackles at that area, otherwise clear lungs without wheezing. Mild improvement in air movement after breathing treatment  Abdominal:     Hernia: There is no hernia in the right inguinal area.  Genitourinary:  Penis: Normal.      Testes: Normal.        Right: Mass not present.        Left: Mass not present.     Comments: No penile or scrotal skin changes  Musculoskeletal:     Right lower leg: No edema.     Left lower leg: No edema.  Lymphadenopathy:     Lower Body: No right inguinal adenopathy.  Skin:    General: Skin is warm and dry.  Neurological:     Mental Status: He is alert.  Psychiatric:        Mood and Affect: Mood normal.        Behavior: Behavior normal.       Results for orders placed or performed in visit on 05/29/23  Urine Culture   Specimen: Urine  Result Value Ref Range   MICRO NUMBER: [id number]    SPECIMEN QUALITY: Adequate    Sample Source URINE    STATUS: FINAL    Result:      Mixed genital flora isolated. These superficial bacteria are not indicative of a urinary tract infection. No further organism identification is warranted on this specimen. If clinically indicated, recollect clean-catch, mid-stream urine and transfer  immediately to Urine Culture Transport Tube.   POCT Urinalysis Dipstick (Automated)  Result Value Ref Range   Color, UA yellow    Clarity, UA cloudy    Glucose, UA Positive (A) Negative   Bilirubin, UA negative    Ketones, UA negative    Spec Grav, UA 1.010 1.010 - 1.025   Blood, UA +/-    pH, UA 7.5 5.0 - 8.0   Protein, UA Positive (A) Negative   Urobilinogen, UA 0.2 0.2 or 1.0 E.U./dL   Nitrite, UA negative    Leukocytes, UA  Moderate (2+) (A) Negative   *Note: Due to a large number of results and/or encounters for the requested time period, some results have not been displayed. A complete set of results can be found in Results Review.   DG Chest 2 View CLINICAL DATA:  77 year old male with COPD  EXAM: CHEST - 2 VIEW  COMPARISON:  12/25/2021  FINDINGS: Cardiomediastinal silhouette unchanged in size and contour. No evidence of central vascular congestion. No interlobular septal thickening.  Stigmata of emphysema, with increased retrosternal airspace, flattened hemidiaphragms, increased AP diameter, and hyperinflation on the AP view.  No pneumothorax or pleural effusion. Coarsened interstitial markings, with no confluent airspace disease.  No acute displaced fracture. Degenerative changes of the spine.  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  IMPRESSION: Emphysema, without evidence of acute cardiopulmonary disease  Electronically Signed   By: Gilmer Mor D.O.   On: 05/07/2023 09:27  Assessment & Plan:   Problem List Items Addressed This Visit     Dysuria - Primary   Relevant Orders   POCT Urinalysis Dipstick (Automated) (Completed)   Urine Culture (Completed)   Other Visit Diagnoses     Shortness of breath       Relevant Medications   ipratropium-albuterol (DUONEB) 0.5-2.5 (3) MG/3ML nebulizer solution 3 mL (Completed)        Meds ordered this encounter  Medications  . ipratropium-albuterol (DUONEB) 0.5-2.5 (3) MG/3ML nebulizer solution 3 mL  . predniSONE (DELTASONE) 10 MG tablet    Sig: Take three tablets for 3 days followed by two tablets for 3 days followed by one tablet for 3 days    Dispense:  18 tablet    Refill:  0  . cefdinir (OMNICEF) 300 MG capsule    Sig: Take 1 capsule (300 mg total) by mouth 2 (two) times daily for 7 days.    Dispense:  14 capsule    Refill:  0  . doxycycline (VIBRA-TABS) 100 MG tablet    Sig: Take 1 tablet (100 mg total) by  mouth 2 (two) times daily.    Dispense:  14 tablet    Refill:  0    Orders Placed This Encounter  Procedures  . Urine Culture  . POCT Urinalysis Dipstick (Automated)    Patient Instructions  Breathing treatment today  Walking oxygen test today  Urine culture sent today.  Oxygen levels did not drop with walking.  I'd like you to take antibiotics omnicef + doxycycline + steroid taper to treat worsening COPD and urine infection. Doxycycline antibiotic will thin your blood - increase leafy greens.  Return in 1-2 weeks for follow up visit.   Follow up plan: Return in about 2 weeks (around 06/12/2023), or if symptoms worsen or fail to improve, for follow up visit.  Eustaquio Boyden, MD

## 2023-06-01 NOTE — Assessment & Plan Note (Addendum)
Discussed monitoring for hyperglycemia while on prednisone taper

## 2023-06-01 NOTE — Assessment & Plan Note (Signed)
[place] CSRS reviewed  ?

## 2023-06-01 NOTE — Assessment & Plan Note (Signed)
Concern for infection given abnormal UA - send culture.  Rx omnicef 300mg  BID 7d course.  Update if not improving with treatment.

## 2023-06-01 NOTE — Assessment & Plan Note (Signed)
I will notify coumadin cardiology clinic that I've restarted doxycycline + cefdinir course to treat concomitant UTI and COPD exacerbation.  I did ask patient to increase greens while on antibiotics.

## 2023-06-01 NOTE — Assessment & Plan Note (Signed)
Concern for current COPD exacerbation vs worsening baseline COPD given ongoing dyspnea associated with subjective wheezing.  No fever, or significant increase in cough or sputum production.  Already on Breztri - doesn't feel this is beneficial.  Ambulatory pulse ox today without significant desaturation (maintains 90s). Does not seem to qualify for home oxygen at this time.  Rx today with doxycycline + cefdinir course, add prednisone taper.  Consider low dose prednisone if beneficial.  Short term f/u 2-3 wks

## 2023-06-01 NOTE — Assessment & Plan Note (Signed)
Ongoing progressive worsening shortness of breath. ?worsening COPD. Not consistent with CHF exacerbation.  He has diminished breath sounds to RLL, however recent CXR last month was reassuring.  Regardless Rx antibiotic + prednisone to cover possible lung infection.

## 2023-06-12 ENCOUNTER — Other Ambulatory Visit: Payer: Self-pay | Admitting: Family Medicine

## 2023-06-12 DIAGNOSIS — E1140 Type 2 diabetes mellitus with diabetic neuropathy, unspecified: Secondary | ICD-10-CM

## 2023-06-12 DIAGNOSIS — H2512 Age-related nuclear cataract, left eye: Secondary | ICD-10-CM | POA: Diagnosis not present

## 2023-06-12 DIAGNOSIS — H2511 Age-related nuclear cataract, right eye: Secondary | ICD-10-CM | POA: Diagnosis not present

## 2023-06-12 NOTE — Telephone Encounter (Signed)
E-scribed refill.  Plz schedule CPE and fasting lab (no food/drink- except water and/or blk coffee 5 hrs prior) visits to prevent delays in future refills.

## 2023-06-12 NOTE — Telephone Encounter (Signed)
Upon looking at chart, patient is scheduled in October for CPE/ Labs. No action needed.

## 2023-06-12 NOTE — Telephone Encounter (Signed)
Noted  

## 2023-06-15 ENCOUNTER — Ambulatory Visit (INDEPENDENT_AMBULATORY_CARE_PROVIDER_SITE_OTHER): Payer: Medicare Other | Admitting: Family Medicine

## 2023-06-15 ENCOUNTER — Ambulatory Visit (INDEPENDENT_AMBULATORY_CARE_PROVIDER_SITE_OTHER)
Admission: RE | Admit: 2023-06-15 | Discharge: 2023-06-15 | Disposition: A | Discharge status: Home / Self Care | Payer: Medicare Other | Source: Ambulatory Visit | Attending: Family Medicine | Admitting: Family Medicine

## 2023-06-15 ENCOUNTER — Encounter: Payer: Self-pay | Admitting: Family Medicine

## 2023-06-15 VITALS — BP 120/72 | HR 70 | Temp 97.7°F | Ht 72.0 in | Wt 214.1 lb

## 2023-06-15 DIAGNOSIS — R29898 Other symptoms and signs involving the musculoskeletal system: Secondary | ICD-10-CM | POA: Diagnosis not present

## 2023-06-15 DIAGNOSIS — M5126 Other intervertebral disc displacement, lumbar region: Secondary | ICD-10-CM | POA: Diagnosis not present

## 2023-06-15 DIAGNOSIS — Z7901 Long term (current) use of anticoagulants: Secondary | ICD-10-CM | POA: Diagnosis not present

## 2023-06-15 DIAGNOSIS — J449 Chronic obstructive pulmonary disease, unspecified: Secondary | ICD-10-CM | POA: Diagnosis not present

## 2023-06-15 DIAGNOSIS — R300 Dysuria: Secondary | ICD-10-CM

## 2023-06-15 DIAGNOSIS — R0609 Other forms of dyspnea: Secondary | ICD-10-CM | POA: Diagnosis not present

## 2023-06-15 DIAGNOSIS — M47816 Spondylosis without myelopathy or radiculopathy, lumbar region: Secondary | ICD-10-CM | POA: Diagnosis not present

## 2023-06-15 DIAGNOSIS — M549 Dorsalgia, unspecified: Secondary | ICD-10-CM | POA: Diagnosis not present

## 2023-06-15 DIAGNOSIS — M79604 Pain in right leg: Secondary | ICD-10-CM | POA: Diagnosis not present

## 2023-06-15 LAB — POC URINALSYSI DIPSTICK (AUTOMATED)
Bilirubin, UA: NEGATIVE
Glucose, UA: POSITIVE — AB
Ketones, UA: NEGATIVE
Nitrite, UA: NEGATIVE
Protein, UA: NEGATIVE
Spec Grav, UA: 1.015 (ref 1.010–1.025)
Urobilinogen, UA: 0.2 E.U./dL
pH, UA: 6 (ref 5.0–8.0)

## 2023-06-15 NOTE — Progress Notes (Unsigned)
Ph: [phone] Fax: [phone]   Patient ID: [name], male    DOB: July 26, 1946, 77 y.o.   MRN: [id number]  This visit was conducted in person.  BP 120/72   Pulse 70   Temp 97.7 F (36.5 C) (Temporal)   Ht 6' (1.829 m)   Wt 214 lb 2 oz (97.1 kg)   SpO2 93%   BMI 29.04 kg/m   BP Readings from Last 3 Encounters:  06/15/23 120/72  05/29/23 118/80  05/25/23 102/68   CC: COPD f/u visit  Subjective:   HPI: [name] is a 77 y.o. male presenting on 06/15/2023 for Medical Management of Chronic Issues (Here for 2-3 wk COPD and dysuria f/u. Dysuria improving. Pt accompanied by wife, [name]. )   Recently completed doxycycline + cefdinir course as well as prednisone taper for COPD exacerbation as well as concern for UTI due to abnormal UA (UCx grew mixed genital flora, not indicative of UTI). He is on Jardiance 10mg  daily. Urinary symptoms are overall improved. Lab Results  Component Value Date   HGBA1C 8.1 (H) 05/06/2023    He does use nocturnal oxygen.  He does take afternoon naps. Bedtime varies between 11pm to 2am, wakes up at 7am.  He continues breztri PRN  Chronic R LE weakness and leg pain present for months. Easily loses balance, leans to right with walking. Needs cane to ambulate. Chronic lower back pain normally uses lumbar support brace. This may have started after falls.  RLE 4/5 strength with hip flexion, knee flexion, extension.   Upcoming cataract surgery 06/2023, 07/2023 by Dr Wynelle Link.   Currently taking 1 tablet daily (2.5mg ) for coumadin     Relevant past medical, surgical, family and social history reviewed and updated as indicated. Interim medical history since our last visit reviewed. Allergies and medications reviewed and updated. Outpatient Medications Prior to Visit  Medication Sig Dispense Refill   albuterol (VENTOLIN HFA) 108 (90 Base) MCG/ACT inhaler Inhale 2 puffs into the lungs every 6 (six) hours as needed for wheezing or shortness of breath.  18 g 3   amiodarone (PACERONE) 200 MG tablet Take 200 mg by mouth as needed. Take for fast heart rate>100     aspirin 81 MG EC tablet Take 1 tablet (81 mg total) by mouth daily. Swallow whole.     Budeson-Glycopyrrol-Formoterol (BREZTRI AEROSPHERE) 160-9-4.8 MCG/ACT AERO Inhale 2 puffs into the lungs in the morning and at bedtime. 3 each 3   Cholecalciferol (VITAMIN D) 50 MCG (2000 UT) CAPS Take 1 capsule (2,000 Units total) by mouth daily. 30 capsule    Coenzyme Q10 (COQ-10 PO) Take 1 tablet by mouth daily.     Cyanocobalamin (B-12) 1000 MCG SUBL Place 1 tablet under the tongue every Monday, Wednesday, and Friday. 30 tablet    cyclobenzaprine (FLEXERIL) 5 MG tablet Take 1 tablet (5 mg total) by mouth 2 (two) times daily as needed for muscle spasms (sedation precautions). 30 tablet 0   diclofenac Sodium (VOLTAREN) 1 % GEL Apply 4 g topically 3 (three) times daily. As needed 100 g 3   diltiazem (CARDIZEM) 30 MG tablet TAKE 1 TABLET(30 MG) BY MOUTH DAILY AS NEEDED FOR FAST HEART RATE 30 tablet 5   diphenoxylate-atropine (LOMOTIL) 2.5-0.025 MG tablet Take 1 tablet by mouth 2 (two) times daily as needed. 20 tablet 0   empagliflozin (JARDIANCE) 10 MG TABS tablet Take 1 tablet (10 mg total) by mouth daily before breakfast. 30 tablet 3   Ferrous  Sulfate (IRON) 325 (65 Fe) MG TABS Take by mouth daily.     fish oil-omega-3 fatty acids 1000 MG capsule Take 2 g by mouth daily.     furosemide (LASIX) 20 MG tablet TAKE 1 TABLET BY MOUTH EVERY DAY AS NEEDED FOR FLUID RETENTION 90 tablet 0   gabapentin (NEURONTIN) 600 MG tablet TAKE 1 TABLET(600 MG) BY MOUTH TWICE DAILY 180 tablet 1   gatifloxacin (ZYMAXID) 0.5 % SOLN SMARTSIG:In Eye(s)     HYDROcodone-acetaminophen (NORCO) 10-325 MG tablet Take 1 tablet by mouth every 6 (six) hours as needed for moderate pain or severe pain. 120 tablet 0   ipratropium-albuterol (DUONEB) 0.5-2.5 (3) MG/3ML SOLN Take 3 mLs by nebulization every 6 (six) hours as needed. 360 mL 6    ketorolac (ACULAR) 0.5 % ophthalmic solution SMARTSIG:In Eye(s)     loratadine (CLARITIN) 10 MG tablet Take 1 tablet (10 mg total) by mouth daily.     metFORMIN (GLUCOPHAGE-XR) 500 MG 24 hr tablet TAKE 1 TABLET(500 MG) BY MOUTH TWICE DAILY WITH A MEAL 180 tablet 0   metoprolol tartrate (LOPRESSOR) 25 MG tablet TAKE 1 TABLET BY MOUTH TWICE DAILY FOR FAST HEART RATE>100 180 tablet 0   midodrine (PROAMATINE) 10 MG tablet Take 1 tablet (10 mg total) by mouth 3 (three) times daily as needed. 90 tablet 1   mupirocin cream (BACTROBAN) 2 % Apply 1 Application topically 2 (two) times daily. To affected wound on leg 15 g 0   nitroGLYCERIN (NITROLINGUAL) 0.4 MG/SPRAY spray USE 1 SPRAY AS DIRECTED EVERY 5 MINUTES AS NEEDED 4.9 g 1   omeprazole (PRILOSEC) 40 MG capsule TAKE 1 CAPSULE(40 MG) BY MOUTH DAILY 90 capsule 1   pravastatin (PRAVACHOL) 20 MG tablet TAKE 1 TABLET(20 MG) BY MOUTH DAILY 90 tablet 3   prednisoLONE acetate (PRED FORTE) 1 % ophthalmic suspension SMARTSIG:In Eye(s)     spironolactone (ALDACTONE) 25 MG tablet Take 0.5 tablets (12.5 mg total) by mouth daily. 45 tablet 1   triamcinolone cream (KENALOG) 0.5 % Apply 1 Application topically 2 (two) times daily. 30 g 0   warfarin (COUMADIN) 2.5 MG tablet TAKE 1&1/2 TABLETS BY MOUTH DAILY OR AS DIRECTED AFTER ANTI-COAG CLINIC 150 tablet 0   doxycycline (VIBRA-TABS) 100 MG tablet Take 1 tablet (100 mg total) by mouth 2 (two) times daily. 14 tablet 0   predniSONE (DELTASONE) 10 MG tablet Take three tablets for 3 days followed by two tablets for 3 days followed by one tablet for 3 days 18 tablet 0   No facility-administered medications prior to visit.     Per HPI unless specifically indicated in ROS section below Review of Systems  Objective:  BP 120/72   Pulse 70   Temp 97.7 F (36.5 C) (Temporal)   Ht 6' (1.829 m)   Wt 214 lb 2 oz (97.1 kg)   SpO2 93%   BMI 29.04 kg/m   Wt Readings from Last 3 Encounters:  06/15/23 214 lb 2 oz (97.1 kg)   05/29/23 208 lb (94.3 kg)  05/25/23 208 lb (94.3 kg)      Physical Exam    Results for orders placed or performed in visit on 06/15/23  POCT Urinalysis Dipstick (Automated)  Result Value Ref Range   Color, UA yellow    Clarity, UA cloudy    Glucose, UA Positive (A) Negative   Bilirubin, UA negative    Ketones, UA negative    Spec Grav, UA 1.015 1.010 - 1.025   Blood, UA +/-  pH, UA 6.0 5.0 - 8.0   Protein, UA Negative Negative   Urobilinogen, UA 0.2 0.2 or 1.0 E.U./dL   Nitrite, UA negatie    Leukocytes, UA Moderate (2+) (A) Negative   *Note: Due to a large number of results and/or encounters for the requested time period, some results have not been displayed. A complete set of results can be found in Results Review.    Assessment & Plan:   Problem List Items Addressed This Visit     Dysuria - Primary   Relevant Orders   POCT Urinalysis Dipstick (Automated) (Completed)   Urine Culture   Other Visit Diagnoses     Right leg weakness       Relevant Orders   DG Lumbar Spine Complete        No orders of the defined types were placed in this encounter.   Orders Placed This Encounter  Procedures   Urine Culture   DG Lumbar Spine Complete    Standing Status:   Future    Number of Occurrences:   1    Standing Expiration Date:   06/14/2024    Order Specific Question:   Reason for Exam (SYMPTOM  OR DIAGNOSIS REQUIRED)    Answer:   right leg pain and weakness with  back pain    Order Specific Question:   Preferred imaging location?    Answer:   [facility]   POCT Urinalysis Dipstick (Automated)    Patient Instructions  Lower back xray today  Coumadin check today  Continue current medicines including daily controller inhaler Breztri - 2 puffs twice daily only.  Albuterol or duoneb is a rescue inhaler/medicine (for quick action).  Return for October wellness visit, sooner as needed.   Follow up plan: Return if symptoms worsen or fail to  improve.  Eustaquio Boyden, MD

## 2023-06-15 NOTE — Patient Instructions (Addendum)
Lower back xray today  Coumadin check today  Continue current medicines including daily controller inhaler Breztri - 2 puffs twice daily only.  Albuterol or duoneb is a rescue inhaler/medicine (for quick action).  Return for October wellness visit, sooner as needed.

## 2023-06-16 DIAGNOSIS — R29898 Other symptoms and signs involving the musculoskeletal system: Secondary | ICD-10-CM | POA: Insufficient documentation

## 2023-06-16 DIAGNOSIS — R300 Dysuria: Secondary | ICD-10-CM | POA: Diagnosis not present

## 2023-06-16 LAB — POCT INR: INR: 1.8 — AB (ref 2.0–3.0)

## 2023-06-16 NOTE — Assessment & Plan Note (Signed)
Ongoing difficulty, overall some better after recent abx and prednisone course. Continue to monitor. He has nocturnal oxygen he uses regularly.  Suggested he try this PRN during day especially when more short of breath.

## 2023-06-16 NOTE — Assessment & Plan Note (Signed)
Was not contacted by coumadin clinic for coumadin titration while on antibiotics.  Wife did drop Coumadin dosing to one 2.5 mg tablet daily instead of his usual 1.5 tablets daily. Point-of-care INR checked today = 1.8.  They will go back up to 1.5 tablets or 3.75 mg Coumadin daily.

## 2023-06-16 NOTE — Assessment & Plan Note (Signed)
Taking Breztri PRN, but mostly 4 puffs in evenings.  Again reviewed difference between controller and rescue inhalers.  Reviewed mechanism of action of controller inhalers.  Do recommend he take as prescribed  - 2 puffs BID.

## 2023-06-16 NOTE — Assessment & Plan Note (Signed)
Completed omnicef 7d course with significant improvement in urinary symptoms.  Previous UCx was inconclusive (mixed genital flora).  ?SGLT2i contributing to symptoms - discussed let me know if ongoing symptoms to consider discontinuing Jardiance.  UA today remains abnormal - will again send culture.

## 2023-06-16 NOTE — Assessment & Plan Note (Signed)
Chronic right lower extremity weakness and leg pain present for many months.  He notes he easily loses his balance.  He did have several falls over the past several years.  With associated lower back pain, will start by updating lumbar films.  Otherwise nonfocal neurological exam-doubt stroke related.

## 2023-06-17 LAB — URINE CULTURE
MICRO NUMBER:: 15176722
SPECIMEN QUALITY:: ADEQUATE

## 2023-06-23 ENCOUNTER — Encounter: Payer: Self-pay | Admitting: Family Medicine

## 2023-06-23 DIAGNOSIS — M8580 Other specified disorders of bone density and structure, unspecified site: Secondary | ICD-10-CM | POA: Insufficient documentation

## 2023-06-25 ENCOUNTER — Encounter: Payer: Medicare Other | Admitting: Pharmacist

## 2023-06-30 ENCOUNTER — Other Ambulatory Visit: Payer: Self-pay | Admitting: Family Medicine

## 2023-06-30 DIAGNOSIS — G894 Chronic pain syndrome: Secondary | ICD-10-CM

## 2023-07-01 ENCOUNTER — Ambulatory Visit: Payer: Medicare Other | Attending: Cardiovascular Disease

## 2023-07-01 DIAGNOSIS — Z5181 Encounter for therapeutic drug level monitoring: Secondary | ICD-10-CM | POA: Diagnosis not present

## 2023-07-01 DIAGNOSIS — I4891 Unspecified atrial fibrillation: Secondary | ICD-10-CM | POA: Diagnosis not present

## 2023-07-01 LAB — POCT INR: INR: 2.1 (ref 2.0–3.0)

## 2023-07-01 NOTE — Patient Instructions (Signed)
Continue on same dosage of Warfarin 1.5 tablets daily. Recheck INR in 6 weeks

## 2023-07-01 NOTE — Telephone Encounter (Addendum)
Name of Medication:  Hydrocodone-APAP Name of Pharmacy:  [facility] Last Fill or Written Date and Quantity:  05/17/23, #120 Last Office Visit and Type:  06/15/23, DOE Next Office Visit and Type:  09/22/23, CPE Last Controlled Substance Agreement Date:  02/03/18 Last UDS:  07/06/17

## 2023-07-02 MED ORDER — HYDROCODONE-ACETAMINOPHEN 10-325 MG PO TABS: 1.0000 | ORAL_TABLET | Freq: Four times a day (QID) | ORAL | 0 refills | Status: DC | PRN

## 2023-07-02 NOTE — Telephone Encounter (Signed)
ERx 
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
## 2023-07-03 ENCOUNTER — Telehealth: Payer: Self-pay | Admitting: Cardiovascular Disease

## 2023-07-03 ENCOUNTER — Encounter: Payer: Self-pay | Admitting: Cardiovascular Disease

## 2023-07-03 ENCOUNTER — Ambulatory Visit: Payer: Medicare Other | Attending: Cardiovascular Disease | Admitting: Cardiovascular Disease

## 2023-07-03 VITALS — BP 90/60 | HR 67 | Ht 73.0 in | Wt 211.4 lb

## 2023-07-03 DIAGNOSIS — I959 Hypotension, unspecified: Secondary | ICD-10-CM | POA: Diagnosis not present

## 2023-07-03 DIAGNOSIS — E782 Mixed hyperlipidemia: Secondary | ICD-10-CM | POA: Insufficient documentation

## 2023-07-03 DIAGNOSIS — I25118 Atherosclerotic heart disease of native coronary artery with other forms of angina pectoris: Secondary | ICD-10-CM | POA: Diagnosis not present

## 2023-07-03 DIAGNOSIS — I714 Abdominal aortic aneurysm, without rupture, unspecified: Secondary | ICD-10-CM | POA: Insufficient documentation

## 2023-07-03 DIAGNOSIS — R001 Bradycardia, unspecified: Secondary | ICD-10-CM | POA: Diagnosis not present

## 2023-07-03 DIAGNOSIS — I480 Paroxysmal atrial fibrillation: Secondary | ICD-10-CM | POA: Diagnosis not present

## 2023-07-03 MED ORDER — METOPROLOL TARTRATE 25 MG PO TABS: ORAL_TABLET | ORAL | 0 refills | Status: DC

## 2023-07-03 MED ORDER — MIDODRINE HCL 10 MG PO TABS: 10.0000 mg | ORAL_TABLET | Freq: Three times a day (TID) | ORAL | 1 refills | Status: DC | PRN

## 2023-07-03 MED ORDER — AMIODARONE HCL 200 MG PO TABS: 200.0000 mg | ORAL_TABLET | ORAL | 3 refills | Status: DC | PRN

## 2023-07-03 MED ORDER — DILTIAZEM HCL 30 MG PO TABS: ORAL_TABLET | ORAL | 5 refills | Status: DC

## 2023-07-03 MED ORDER — FUROSEMIDE 20 MG PO TABS: ORAL_TABLET | ORAL | 0 refills | Status: DC

## 2023-07-03 NOTE — Telephone Encounter (Signed)
Pt c/o medication issue:  1. Name of Medication:   amiodarone (PACERONE) 200 MG tablet   2. How are you currently taking this medication (dosage and times per day)?   3. Are you having a reaction (difficulty breathing--STAT)?   4. What is your medication issue?   Caller wants clarification on maximum number of tablets patient can take daily or dosing frequency.

## 2023-07-03 NOTE — Patient Instructions (Signed)
Medication Instructions:  No changes  If you need a refill on your cardiac medications before your next appointment, please call your pharmacy.   Lab work: No new labs needed  Testing/Procedures: No new testing needed  Follow-Up: At [facility], you and your health needs are our priority.  As part of our continuing mission to provide you with exceptional heart care, we have created designated Provider Care Teams.  These Care Teams include your primary Cardiologist (physician) and Advanced Practice Providers (APPs -  Physician Assistants and Nurse Practitioners) who all work together to provide you with the care you need, when you need it.  You will need a follow up appointment in 6 months, APP ok  Providers on your designated Care Team:   Cadence Fransico Michael, [place]  COVID-19 Vaccine Information can be found at: [email or web address] For questions related to vaccine distribution or appointments, please email [email or web address] or call [phone].

## 2023-07-06 NOTE — Telephone Encounter (Signed)
Called pharmacy and clarified for Amiodarone prescription per Dr. Mariah Milling.  Amiodarone 200 mg daily as needed for elevated heart rate/palpitations  Thx  TG

## 2023-07-14 ENCOUNTER — Other Ambulatory Visit: Payer: Self-pay | Admitting: Family Medicine

## 2023-07-14 DIAGNOSIS — G6289 Other specified polyneuropathies: Secondary | ICD-10-CM

## 2023-07-15 NOTE — Telephone Encounter (Signed)
Gabapentin Last filled:  04/21/23, #180 Last OV:  06/15/23, 2-3 wk COPD/dysuria f/u Next OV:  09/22/23, CPE

## 2023-07-16 DIAGNOSIS — H2511 Age-related nuclear cataract, right eye: Secondary | ICD-10-CM | POA: Diagnosis not present

## 2023-07-20 ENCOUNTER — Other Ambulatory Visit: Payer: Self-pay | Admitting: Cardiovascular Disease

## 2023-07-20 NOTE — Telephone Encounter (Signed)
Refill request for Warfarin

## 2023-07-29 ENCOUNTER — Other Ambulatory Visit: Payer: Self-pay | Admitting: Family Medicine

## 2023-07-31 ENCOUNTER — Ambulatory Visit: Payer: Medicare HMO | Admitting: Family Medicine

## 2023-07-31 ENCOUNTER — Ambulatory Visit (INDEPENDENT_AMBULATORY_CARE_PROVIDER_SITE_OTHER)
Admission: RE | Admit: 2023-07-31 | Discharge: 2023-07-31 | Disposition: A | Discharge status: Home / Self Care | Payer: Medicare HMO | Source: Ambulatory Visit | Attending: Family Medicine | Admitting: Family Medicine

## 2023-07-31 ENCOUNTER — Encounter: Payer: Self-pay | Admitting: Family Medicine

## 2023-07-31 VITALS — BP 108/62 | HR 64 | Temp 98.2°F | Ht 73.0 in

## 2023-07-31 DIAGNOSIS — M545 Low back pain, unspecified: Secondary | ICD-10-CM | POA: Diagnosis not present

## 2023-07-31 DIAGNOSIS — R29898 Other symptoms and signs involving the musculoskeletal system: Secondary | ICD-10-CM

## 2023-07-31 DIAGNOSIS — F1120 Opioid dependence, uncomplicated: Secondary | ICD-10-CM | POA: Diagnosis not present

## 2023-07-31 DIAGNOSIS — G894 Chronic pain syndrome: Secondary | ICD-10-CM | POA: Diagnosis not present

## 2023-07-31 DIAGNOSIS — M8580 Other specified disorders of bone density and structure, unspecified site: Secondary | ICD-10-CM | POA: Diagnosis not present

## 2023-07-31 DIAGNOSIS — M5136 Other intervertebral disc degeneration, lumbar region: Secondary | ICD-10-CM | POA: Diagnosis not present

## 2023-07-31 DIAGNOSIS — M5441 Lumbago with sciatica, right side: Secondary | ICD-10-CM | POA: Diagnosis not present

## 2023-07-31 DIAGNOSIS — G8929 Other chronic pain: Secondary | ICD-10-CM

## 2023-07-31 MED ORDER — METHOCARBAMOL 500 MG PO TABS: 500.0000 mg | ORAL_TABLET | Freq: Three times a day (TID) | ORAL | 0 refills | Status: DC | PRN

## 2023-07-31 MED ORDER — HYDROCODONE-ACETAMINOPHEN 10-325 MG PO TABS: 1.0000 | ORAL_TABLET | Freq: Four times a day (QID) | ORAL | 0 refills | Status: DC | PRN

## 2023-07-31 NOTE — Assessment & Plan Note (Signed)
Longterm hydrocodone use.

## 2023-07-31 NOTE — Progress Notes (Signed)
Ph: [phone] Fax: [phone]   Patient ID: [name], male    DOB: April 01, 1946, 77 y.o.   MRN: [id number]  This visit was conducted in person.  BP 108/62   Pulse 64   Temp 98.2 F (36.8 C) (Temporal)   Ht 6\' 1"  (1.854 m)   SpO2 93%   BMI 27.89 kg/m   BP Readings from Last 3 Encounters:  07/31/23 108/62  07/03/23 90/60  06/15/23 120/72    CC: back pain  Subjective:   HPI: [name] is a 77 y.o. male presenting on 07/31/2023 for Back Pain (Into hips increased recently )   Chronic R LE weakness and leg pain present for months, associated with imbalance, leaning to right with walking. Ambulates with cane. Chronic lower back pain - normally uses lumbar support brace. This may have started after multiple falls over the past few years.   DOI: ~06/27/2023  Had a fall outside where chair leg broke and he fell over and landed onto grill. Respiratory therapist helped him get up.  Since then worsening lower back pain with pain extending into R buttock and down leg.  No pain when supine, worse when sitting or standing, less walking due to pain.  No worsened numbness or weakness of leg, bowel/bladder accidents.  Has had to double up on hydrocodone 10/325mg  and gabapentin 600mg  due to worsening pain. He also takes flexeril 5mg    Progressively worsening pain since 07/16/2023 (after cataract surgery)  No blood in stool or urine or new bruising  DG Lumbar Spine Complete CLINICAL DATA:  right leg pain and weakness with  back pain  EXAM: LUMBAR SPINE - COMPLETE 4+ VIEW  COMPARISON:  August 11, 2017  FINDINGS: Osteopenia. There are five non-rib bearing lumbar-type vertebral bodies. Straightening of the lumbar lordosis. Trace retrolisthesis of L2-3. There is no evidence for acute fracture or subluxation. Moderate multilevel intervertebral disc space height loss most pronounced at L2-3. facet arthropathy. Status post endovascular repair of the  aorta.  IMPRESSION: Moderate multilevel degenerative changes most pronounced at L2-3.  Electronically Signed   By: [name] M.D.   On: 06/19/2023 12:54      Relevant past medical, surgical, family and social history reviewed and updated as indicated. Interim medical history since our last visit reviewed. Allergies and medications reviewed and updated. Outpatient Medications Prior to Visit  Medication Sig Dispense Refill   albuterol (VENTOLIN HFA) 108 (90 Base) MCG/ACT inhaler Inhale 2 puffs into the lungs every 6 (six) hours as needed for wheezing or shortness of breath. 18 g 3   amiodarone (PACERONE) 200 MG tablet Take 1 tablet (200 mg total) by mouth as needed. Take for fast heart rate>100 60 tablet 3   aspirin 81 MG EC tablet Take 1 tablet (81 mg total) by mouth daily. Swallow whole.     Budeson-Glycopyrrol-Formoterol (BREZTRI AEROSPHERE) 160-9-4.8 MCG/ACT AERO Inhale 2 puffs into the lungs in the morning and at bedtime. 3 each 3   Cholecalciferol (VITAMIN D) 50 MCG (2000 UT) CAPS Take 1 capsule (2,000 Units total) by mouth daily. 30 capsule    Coenzyme Q10 (COQ-10 PO) Take 1 tablet by mouth daily.     Cyanocobalamin (B-12) 1000 MCG SUBL Place 1 tablet under the tongue every Monday, Wednesday, and Friday. 30 tablet    diclofenac Sodium (VOLTAREN) 1 % GEL Apply 4 g topically 3 (three) times daily. As needed 100 g 3   diltiazem (CARDIZEM) 30 MG tablet Take 1 tablet by  mouth daily as needed for fast heart rate 30 tablet 5   diphenoxylate-atropine (LOMOTIL) 2.5-0.025 MG tablet Take 1 tablet by mouth 2 (two) times daily as needed. 20 tablet 0   empagliflozin (JARDIANCE) 10 MG TABS tablet Take 1 tablet (10 mg total) by mouth daily before breakfast. 30 tablet 3   Ferrous Sulfate (IRON) 325 (65 Fe) MG TABS Take by mouth daily.     fish oil-omega-3 fatty acids 1000 MG capsule Take 2 g by mouth daily.     furosemide (LASIX) 20 MG tablet TAKE 1 TABLET BY MOUTH EVERY DAY AS NEEDED FOR  FLUID RETENTION 90 tablet 0   gabapentin (NEURONTIN) 600 MG tablet TAKE 1 TABLET(600 MG) BY MOUTH TWICE DAILY 180 tablet 1   gatifloxacin (ZYMAXID) 0.5 % SOLN SMARTSIG:In Eye(s)     ipratropium-albuterol (DUONEB) 0.5-2.5 (3) MG/3ML SOLN Take 3 mLs by nebulization every 6 (six) hours as needed. 360 mL 6   ketorolac (ACULAR) 0.5 % ophthalmic solution SMARTSIG:In Eye(s)     loratadine (CLARITIN) 10 MG tablet Take 1 tablet (10 mg total) by mouth daily.     metFORMIN (GLUCOPHAGE-XR) 500 MG 24 hr tablet TAKE 1 TABLET(500 MG) BY MOUTH TWICE DAILY WITH A MEAL 180 tablet 0   metoprolol tartrate (LOPRESSOR) 25 MG tablet TAKE 1 TABLET BY MOUTH TWICE DAILY FOR FAST HEART RATE>100 180 tablet 0   midodrine (PROAMATINE) 10 MG tablet Take 1 tablet (10 mg total) by mouth 3 (three) times daily as needed. 90 tablet 1   mupirocin cream (BACTROBAN) 2 % Apply 1 Application topically 2 (two) times daily. To affected wound on leg 15 g 0   nitroGLYCERIN (NITROLINGUAL) 0.4 MG/SPRAY spray USE 1 SPRAY AS DIRECTED EVERY 5 MINUTES AS NEEDED 4.9 g 1   omeprazole (PRILOSEC) 40 MG capsule TAKE 1 CAPSULE(40 MG) BY MOUTH DAILY 90 capsule 1   pravastatin (PRAVACHOL) 20 MG tablet TAKE 1 TABLET(20 MG) BY MOUTH DAILY 90 tablet 3   prednisoLONE acetate (PRED FORTE) 1 % ophthalmic suspension SMARTSIG:In Eye(s)     spironolactone (ALDACTONE) 25 MG tablet Take 0.5 tablets (12.5 mg total) by mouth daily. 45 tablet 1   triamcinolone cream (KENALOG) 0.5 % Apply 1 Application topically 2 (two) times daily. 30 g 0   warfarin (COUMADIN) 2.5 MG tablet TAKE 1 1/2 TABLETS BY MOUTH EVERY DAY OR AS DIRECTED AFTER ANTI-COAG CLINIC 150 tablet 0   cyclobenzaprine (FLEXERIL) 5 MG tablet Take 1 tablet (5 mg total) by mouth 2 (two) times daily as needed for muscle spasms (sedation precautions). 30 tablet 0   HYDROcodone-acetaminophen (NORCO) 10-325 MG tablet Take 1 tablet by mouth every 6 (six) hours as needed for moderate pain or severe pain. 120 tablet 0    No facility-administered medications prior to visit.     Per HPI unless specifically indicated in ROS section below Review of Systems  Objective:  BP 108/62   Pulse 64   Temp 98.2 F (36.8 C) (Temporal)   Ht 6\' 1"  (1.854 m)   SpO2 93%   BMI 27.89 kg/m   Wt Readings from Last 3 Encounters:  07/03/23 211 lb 6 oz (95.9 kg)  06/15/23 214 lb 2 oz (97.1 kg)  05/29/23 208 lb (94.3 kg)      Physical Exam Vitals and nursing note reviewed.  Constitutional:      Comments: Sitting in wheelchair  Musculoskeletal:        General: Swelling and tenderness present.     Right lower leg:  No edema.     Left lower leg: No edema.     Comments:  Tender to palpation to lumbar spine, B sacroiliac joints, tender swelling to lateral back above buttock R>L without bruising  Neg seated SLR bilaterally No pain with int/ext rotation at bilat hips   Skin:    General: Skin is warm and dry.     Findings: No rash.  Neurological:     Mental Status: He is alert.     Sensory: Sensation is intact.     Motor: Motor function is intact.     Comments:  Unsteady on feet 5/5 strength BLE  Psychiatric:        Mood and Affect: Mood normal.        Behavior: Behavior normal.       Results for orders placed or performed in visit on 07/01/23  POCT INR  Result Value Ref Range   INR 2.1 2.0 - 3.0   POC INR     *Note: Due to a large number of results and/or encounters for the requested time period, some results have not been displayed. A complete set of results can be found in Results Review.    Assessment & Plan:   Problem List Items Addressed This Visit     Chronic low back pain (Fourth Area of Pain) (Bilateral) (Chronic)   Relevant Medications   methocarbamol (ROBAXIN) 500 MG tablet   HYDROcodone-acetaminophen (NORCO) 10-325 MG tablet   Chronic pain syndrome (Chronic)    Refill robaxin and hydrocodone today.       Relevant Medications   methocarbamol (ROBAXIN) 500 MG tablet    HYDROcodone-acetaminophen (NORCO) 10-325 MG tablet   Opiate dependence (HCC) (Chronic)    Longterm hydrocodone use.       Right leg weakness    RLE weakness is actually improved on retesting today.       Osteopenia determined by x-ray   Acute right-sided low back pain with right-sided sciatica - Primary    Xrays started working towards end of visit so we were able to complete xrays. I don't see obvious fracture, will await radiology read.  Anticipate bony contusion after fall suffered last month.  Anticipate should continue to recover slowly as long as seeing improvement each day.  Discussed pain med use - have refilled hydrocodone 10/325mg  - he notes he's had to increase use recently. Refill robaxin as they feel new insurance will cover this well. Discussed heating pad and topical therapies.  Update if not improving as expected over time.  Discussed stronger opiates, don't recommend mixing with hydrocodone. Need to avoid NSAIDs in coumadin use. Recommend avoiding prednisone in osteopenia hx by imaging. He prefers to avoid lidocaine patches.       Relevant Medications   methocarbamol (ROBAXIN) 500 MG tablet   HYDROcodone-acetaminophen (NORCO) 10-325 MG tablet   Other Relevant Orders   DG Lumbar Spine Complete   DG Si Joints     Meds ordered this encounter  Medications   methocarbamol (ROBAXIN) 500 MG tablet    Sig: Take 1 tablet (500 mg total) by mouth every 8 (eight) hours as needed for muscle spasms (sedation precautions).    Dispense:  40 tablet    Refill:  0   HYDROcodone-acetaminophen (NORCO) 10-325 MG tablet    Sig: Take 1 tablet by mouth every 6 (six) hours as needed for moderate pain or severe pain.    Dispense:  120 tablet    Refill:  0    Orders  Placed This Encounter  Procedures   DG Lumbar Spine Complete    Standing Status:   Future    Number of Occurrences:   1    Standing Expiration Date:   07/30/2024    Order Specific Question:   Reason for Exam (SYMPTOM  OR  DIAGNOSIS REQUIRED)    Answer:   persistent R low back pain after fall last month    Order Specific Question:   Preferred imaging location?    Answer:   [facility]   DG Si Joints    Standing Status:   Future    Number of Occurrences:   1    Standing Expiration Date:   07/30/2024    Order Specific Question:   Reason for Exam (SYMPTOM  OR DIAGNOSIS REQUIRED)    Answer:   persistent R low back pain after fall last month    Order Specific Question:   Preferred imaging location?    Answer:   [facility]    Patient Instructions  We were unable to get xrays done today. Return Monday for xrays - but call us ahead of time to ensure xrays are up and running.  For pain, I've refilled hydrocodone 10/325mg  max 4 a day. I've also refilled robaxin 500mg  muscle relaxant you may use for back pain - don't mix with hydrocodone  Follow up plan: Return if symptoms worsen or fail to improve.  Eustaquio Boyden, MD

## 2023-07-31 NOTE — Assessment & Plan Note (Addendum)
RLE weakness is actually improved on retesting today.

## 2023-07-31 NOTE — Patient Instructions (Addendum)
We were unable to get xrays done today. Return Monday for xrays - but call us ahead of time to ensure xrays are up and running.  For pain, I've refilled hydrocodone 10/325mg  max 4 a day. I've also refilled robaxin 500mg  muscle relaxant you may use for back pain - don't mix with hydrocodone

## 2023-07-31 NOTE — Assessment & Plan Note (Addendum)
Xrays started working towards end of visit so we were able to complete xrays. I don't see obvious fracture, will await radiology read.  Anticipate bony contusion after fall suffered last month.  Anticipate should continue to recover slowly as long as seeing improvement each day.  Discussed pain med use - have refilled hydrocodone 10/325mg  - he notes he's had to increase use recently. Refill robaxin as they feel new insurance will cover this well. Discussed heating pad and topical therapies.  Update if not improving as expected over time.  Discussed stronger opiates, don't recommend mixing with hydrocodone. Need to avoid NSAIDs in coumadin use. Recommend avoiding prednisone in osteopenia hx by imaging. He prefers to avoid lidocaine patches.

## 2023-07-31 NOTE — Assessment & Plan Note (Signed)
Refill robaxin and hydrocodone today.

## 2023-08-05 ENCOUNTER — Other Ambulatory Visit: Payer: Self-pay | Admitting: Family Medicine

## 2023-08-05 DIAGNOSIS — J449 Chronic obstructive pulmonary disease, unspecified: Secondary | ICD-10-CM

## 2023-08-06 ENCOUNTER — Encounter: Payer: Self-pay | Admitting: Pulmonary Disease

## 2023-08-12 ENCOUNTER — Ambulatory Visit: Payer: Medicare HMO

## 2023-08-12 ENCOUNTER — Other Ambulatory Visit: Payer: Self-pay

## 2023-08-12 MED ORDER — EMPAGLIFLOZIN 10 MG PO TABS: 10.0000 mg | ORAL_TABLET | Freq: Every day | ORAL | 1 refills | Status: DC

## 2023-08-17 ENCOUNTER — Other Ambulatory Visit: Payer: Self-pay | Admitting: Family Medicine

## 2023-08-17 NOTE — Telephone Encounter (Signed)
Number busy

## 2023-08-18 NOTE — Telephone Encounter (Signed)
Called spoke to wife (on dpr) patient does need refill is still having back pain. He is taking bid. I have set up follow up due to no improvement but can't come in until Monday.

## 2023-08-19 ENCOUNTER — Inpatient Hospital Stay: Payer: Medicare HMO | Attending: Radiation Oncology

## 2023-08-19 ENCOUNTER — Ambulatory Visit: Payer: Medicare HMO | Attending: Cardiovascular Disease

## 2023-08-19 DIAGNOSIS — I4891 Unspecified atrial fibrillation: Secondary | ICD-10-CM

## 2023-08-19 DIAGNOSIS — C61 Malignant neoplasm of prostate: Secondary | ICD-10-CM | POA: Diagnosis not present

## 2023-08-19 DIAGNOSIS — Z5181 Encounter for therapeutic drug level monitoring: Secondary | ICD-10-CM

## 2023-08-19 LAB — POCT INR: INR: 2.8 (ref 2.0–3.0)

## 2023-08-19 LAB — PSA: Prostatic Specific Antigen: 0.29 ng/mL (ref 0.00–4.00)

## 2023-08-19 NOTE — Patient Instructions (Signed)
Continue on same dosage of Warfarin 1.5 tablets daily. Recheck INR in 6 weeks

## 2023-08-24 ENCOUNTER — Ambulatory Visit (INDEPENDENT_AMBULATORY_CARE_PROVIDER_SITE_OTHER): Payer: Medicare HMO | Admitting: Family Medicine

## 2023-08-24 ENCOUNTER — Encounter: Payer: Self-pay | Admitting: Family Medicine

## 2023-08-24 VITALS — BP 112/70 | HR 63 | Temp 97.2°F | Ht 73.0 in | Wt 210.1 lb

## 2023-08-24 DIAGNOSIS — M545 Low back pain, unspecified: Secondary | ICD-10-CM | POA: Diagnosis not present

## 2023-08-24 DIAGNOSIS — G8929 Other chronic pain: Secondary | ICD-10-CM

## 2023-08-24 NOTE — Progress Notes (Unsigned)
Ph: [phone] Fax: [phone]   Patient ID: [name], male    DOB: 04/23/1946, 77 y.o.   MRN: [id number]  This visit was conducted in person.  BP 112/70   Pulse 63   Temp (!) 97.2 F (36.2 C) (Temporal)   Ht 6\' 1"  (1.854 m)   Wt 210 lb 2 oz (95.3 kg)   SpO2 94%   BMI 27.72 kg/m    CC: chronic low back pain  Subjective:   HPI: [name] is a 77 y.o. male presenting on 08/24/2023 for Back Pain (C/o ongoing low back pain. Pt accompanied by wife, [name]. Seen for same sxs previously. )   See prior note for details.  Chronic lower back pain - normally uses lumbar support brace. This may have started after multiple falls over the past few years. Latest fall 06/27/2023. Lumbar and sacroiliac xrays didn't show acute fractures but did have degenerative changes, most prominent at L2/3.   Since this latest fall, has had increase in low back pain, previously pain radiating down R buttock/leg, but this has improved. However notes persistent severe midline lower back pain. Only relief is laying supine - states he spends 20 hours per day in bed.  He had increased hydrocodone use, and continues robaxin and gabapentin 600mg  bid.   Has had 1 fall since July - over weekend - slipped in garage. Fortunately no further injury.   Notes increased urinary urgency with occasional trouble controlling. No bowel changes - has 1 formed stool/day.  No leg pain, numbness, weakness noted.  No fevers/chills, saddle anesthesia.  Back pain at times can cause leg weakness limiting ability to walk.   Upcoming appt with rad onc ([facility]) for f/u prostate cancer      Relevant past medical, surgical, family and social history reviewed and updated as indicated. Interim medical history since our last visit reviewed. Allergies and medications reviewed and updated. Outpatient Medications Prior to Visit  Medication Sig Dispense Refill  . albuterol (VENTOLIN HFA) 108 (90 Base) MCG/ACT inhaler INHALE 2  PUFFS INTO THE LUNGS EVERY 6 HOURS AS NEEDED FOR WHEEZING OR SHORTNESS OF BREATH 18 g 3  . amiodarone (PACERONE) 200 MG tablet Take 1 tablet (200 mg total) by mouth as needed. Take for fast heart rate>100 60 tablet 3  . aspirin 81 MG EC tablet Take 1 tablet (81 mg total) by mouth daily. Swallow whole.    . Budeson-Glycopyrrol-Formoterol (BREZTRI AEROSPHERE) 160-9-4.8 MCG/ACT AERO Inhale 2 puffs into the lungs in the morning and at bedtime. 3 each 3  . Cholecalciferol (VITAMIN D) 50 MCG (2000 UT) CAPS Take 1 capsule (2,000 Units total) by mouth daily. 30 capsule   . Coenzyme Q10 (COQ-10 PO) Take 1 tablet by mouth daily.    . Cyanocobalamin (B-12) 1000 MCG SUBL Place 1 tablet under the tongue every Monday, Wednesday, and Friday. 30 tablet   . diclofenac Sodium (VOLTAREN) 1 % GEL Apply 4 g topically 3 (three) times daily. As needed 100 g 3  . diltiazem (CARDIZEM) 30 MG tablet Take 1 tablet by mouth daily as needed for fast heart rate 30 tablet 5  . diphenoxylate-atropine (LOMOTIL) 2.5-0.025 MG tablet Take 1 tablet by mouth 2 (two) times daily as needed. 20 tablet 0  . empagliflozin (JARDIANCE) 10 MG TABS tablet Take 1 tablet (10 mg total) by mouth daily before breakfast. 90 tablet 1  . Ferrous Sulfate (IRON) 325 (65 Fe) MG TABS Take by mouth daily.    Marland Kitchen  fish oil-omega-3 fatty acids 1000 MG capsule Take 2 g by mouth daily.    . furosemide (LASIX) 20 MG tablet TAKE 1 TABLET BY MOUTH EVERY DAY AS NEEDED FOR FLUID RETENTION 90 tablet 0  . gabapentin (NEURONTIN) 600 MG tablet TAKE 1 TABLET(600 MG) BY MOUTH TWICE DAILY 180 tablet 1  . gatifloxacin (ZYMAXID) 0.5 % SOLN SMARTSIG:In Eye(s)    . HYDROcodone-acetaminophen (NORCO) 10-325 MG tablet Take 1 tablet by mouth every 6 (six) hours as needed for moderate pain or severe pain. 120 tablet 0  . ipratropium-albuterol (DUONEB) 0.5-2.5 (3) MG/3ML SOLN Take 3 mLs by nebulization every 6 (six) hours as needed. 360 mL 6  . ketorolac (ACULAR) 0.5 % ophthalmic  solution SMARTSIG:In Eye(s)    . loratadine (CLARITIN) 10 MG tablet Take 1 tablet (10 mg total) by mouth daily.    . metFORMIN (GLUCOPHAGE-XR) 500 MG 24 hr tablet TAKE 1 TABLET(500 MG) BY MOUTH TWICE DAILY WITH A MEAL 180 tablet 0  . methocarbamol (ROBAXIN) 500 MG tablet TAKE 1 TABLET BY MOUTH EVERY 8 HOURS AS NEEDED FOR MUSCLE SPASMS 40 tablet 0  . metoprolol tartrate (LOPRESSOR) 25 MG tablet TAKE 1 TABLET BY MOUTH TWICE DAILY FOR FAST HEART RATE>100 180 tablet 0  . midodrine (PROAMATINE) 10 MG tablet Take 1 tablet (10 mg total) by mouth 3 (three) times daily as needed. 90 tablet 1  . mupirocin cream (BACTROBAN) 2 % Apply 1 Application topically 2 (two) times daily. To affected wound on leg 15 g 0  . nitroGLYCERIN (NITROLINGUAL) 0.4 MG/SPRAY spray USE 1 SPRAY AS DIRECTED EVERY 5 MINUTES AS NEEDED 4.9 g 1  . omeprazole (PRILOSEC) 40 MG capsule TAKE 1 CAPSULE(40 MG) BY MOUTH DAILY 90 capsule 1  . pravastatin (PRAVACHOL) 20 MG tablet TAKE 1 TABLET(20 MG) BY MOUTH DAILY 90 tablet 3  . prednisoLONE acetate (PRED FORTE) 1 % ophthalmic suspension SMARTSIG:In Eye(s)    . spironolactone (ALDACTONE) 25 MG tablet Take 0.5 tablets (12.5 mg total) by mouth daily. 45 tablet 1  . triamcinolone cream (KENALOG) 0.5 % Apply 1 Application topically 2 (two) times daily. 30 g 0  . warfarin (COUMADIN) 2.5 MG tablet TAKE 1 1/2 TABLETS BY MOUTH EVERY DAY OR AS DIRECTED AFTER ANTI-COAG CLINIC 150 tablet 0   No facility-administered medications prior to visit.     Per HPI unless specifically indicated in ROS section below Review of Systems  Objective:  BP 112/70   Pulse 63   Temp (!) 97.2 F (36.2 C) (Temporal)   Ht 6\' 1"  (1.854 m)   Wt 210 lb 2 oz (95.3 kg)   SpO2 94%   BMI 27.72 kg/m   Wt Readings from Last 3 Encounters:  08/24/23 210 lb 2 oz (95.3 kg)  07/03/23 211 lb 6 oz (95.9 kg)  06/15/23 214 lb 2 oz (97.1 kg)      Physical Exam Vitals and nursing note reviewed.  Constitutional:       Appearance: Normal appearance. He is not ill-appearing.     Comments: Sitting in wheelchair  Musculoskeletal:     Right lower leg: No edema.     Left lower leg: No edema.     Comments:  Wearing lumbar support brace ++ pain midline spine mid lumbar region + mid lumbar paraspinous mm tenderness Neg seated SLR bilaterally. No pain with int/ext rotation at hip. No pain at SIJ, GTB or sciatic notch bilaterally.   Skin:    General: Skin is warm and dry.  Findings: No rash.  Neurological:     General: No focal deficit present.     Mental Status: He is alert.     Sensory: Sensation is intact.     Motor: Motor function is intact.     Comments:  5/5 strength BLE Sensation intact BLE  Psychiatric:        Mood and Affect: Mood normal.        Behavior: Behavior normal.      Results for orders placed or performed in visit on 08/19/23  POCT INR  Result Value Ref Range   INR 2.8 2.0 - 3.0   POC INR     *Note: Due to a large number of results and/or encounters for the requested time period, some results have not been displayed. A complete set of results can be found in Results Review.   DG Si Joints CLINICAL DATA:  Persistent right-sided low back pain after fall last month.  EXAM: LUMBAR SPINE - COMPLETE 4+ VIEW; BILATERAL SACROILIAC JOINTS - 3+ VIEW  COMPARISON:  Lumbar radiograph 08/11/2017  FINDINGS: Lumbar spine: No evidence of fracture. Normal vertebral body heights. Straightening of normal lordosis with trace retrolisthesis of L4 on L5. Mild diffuse degenerative disc disease most prominently affecting L2-L3 with disc space narrowing and spurring. Aorto bi-iliac stent graft.  Sacroiliac joints: The sacroiliac joints are congruent. Minor sacroiliac degenerative spurring. No erosions. Sacrum is intact. No disruption of sacral ala.  IMPRESSION: 1. No acute fracture of the lumbar spine. 2. Diffuse degenerative disc disease most prominently affecting L2-L3. 3. Minor  sacroiliac degenerative spurring.  Electronically Signed   By: Narda Rutherford M.D.   On: 07/31/2023 16:56 DG Lumbar Spine Complete CLINICAL DATA:  Persistent right-sided low back pain after fall last month.  EXAM: LUMBAR SPINE - COMPLETE 4+ VIEW; BILATERAL SACROILIAC JOINTS - 3+ VIEW  COMPARISON:  Lumbar radiograph 08/11/2017  FINDINGS: Lumbar spine: No evidence of fracture. Normal vertebral body heights. Straightening of normal lordosis with trace retrolisthesis of L4 on L5. Mild diffuse degenerative disc disease most prominently affecting L2-L3 with disc space narrowing and spurring. Aorto bi-iliac stent graft.  Sacroiliac joints: The sacroiliac joints are congruent. Minor sacroiliac degenerative spurring. No erosions. Sacrum is intact. No disruption of sacral ala.  IMPRESSION: 1. No acute fracture of the lumbar spine. 2. Diffuse degenerative disc disease most prominently affecting L2-L3. 3. Minor sacroiliac degenerative spurring.  Electronically Signed   By: Narda Rutherford M.D.   On: 07/31/2023 16:56  Assessment & Plan:   Problem List Items Addressed This Visit     Chronic low back pain (Fourth Area of Pain) (Bilateral) - Primary (Chronic)    Acute on chronic exacerbation of lower back pain.  Pain so far uncontrolled with regular use of hydrocodone, robaxin, gabapentin.  States lidocaine patches haven't helped.  States TENS unit previously unhelpful.  Will proceed with MRI prior to considering prednisone taper.       Relevant Orders   MR Lumbar Spine Wo Contrast   Encounter for chronic pain management (Chronic)    [place] CSRS reviewed He states hydrocodone 10/325mg  has provided only limited pain relief, even when he takes 2 at a time.  Previous intolerance listed to oxycodone (shortness of breath) - he requests retrial of this.  Will let me know when running low on hydrocodone (recent fill).       Acute low back pain    Chronic low back pain with acute  exacerbation since fall 06/2023, R sciatic pain  has since improved. Residual severe midline lumbar spine pain associated with worsening urinary urge with occasional incontinence, no bowel symptoms. Debilitating activity limiting pain, most of day is spent supine in bed due to pain. Xray imaging unrevealing, only showing DDD predominant at L2/3.  Pain so far uncontrolled with regular use of hydrocodone, robaxin, gabapentin.  States lidocaine patches haven't helped.  States TENS unit previously unhelpful.  Unable to do PT due to pain.  Will further evaluate with advanced imaging -noncontrasted lumbar MRI.       Relevant Orders   MR Lumbar Spine Wo Contrast     No orders of the defined types were placed in this encounter.   Orders Placed This Encounter  Procedures  . MR Lumbar Spine Wo Contrast    Open MRI requested. Let me know if unavailable    Standing Status:   Future    Standing Expiration Date:   08/23/2024    Order Specific Question:   What is the patient's sedation requirement?    Answer:   No Sedation    Order Specific Question:   Does the patient have a pacemaker or implanted devices?    Answer:   No    Order Specific Question:   Preferred imaging location?    Answer:   [facility] (table limit-350lbs)    Patient Instructions  We will proceed with lumbar MRI without contrast for further evaluation. Will do open MRI. We will be in touch with results.  You can call [phone] to schedule appointment   Follow up plan: Return if symptoms worsen or fail to improve.  Eustaquio Boyden, MD

## 2023-08-24 NOTE — Patient Instructions (Addendum)
We will proceed with lumbar MRI without contrast for further evaluation. Will do open MRI. We will be in touch with results.  You can call [phone] to schedule appointment

## 2023-08-25 NOTE — Assessment & Plan Note (Addendum)
Chronic low back pain with acute exacerbation since fall 06/2023, R sciatic pain has since improved. Residual severe midline lumbar spine pain associated with worsening urinary urge with occasional incontinence, no bowel symptoms. Debilitating activity limiting pain, most of day is spent supine in bed due to pain. Xray imaging unrevealing, only showing DDD predominant at L2/3.  Pain so far uncontrolled with regular use of hydrocodone, robaxin, gabapentin.  States lidocaine patches haven't helped.  States TENS unit previously unhelpful.  Unable to do PT due to pain.  Will further evaluate with advanced imaging -noncontrasted lumbar MRI.

## 2023-08-25 NOTE — Assessment & Plan Note (Addendum)
[name] CSRS reviewed He states hydrocodone 10/325mg  has provided only limited pain relief, even when he takes 2 at a time.  Previous intolerance listed to oxycodone (shortness of breath) - he requests retrial of this.  Will let me know when running low on hydrocodone (recent fill).

## 2023-08-25 NOTE — Assessment & Plan Note (Signed)
Acute on chronic exacerbation of lower back pain.  Pain so far uncontrolled with regular use of hydrocodone, robaxin, gabapentin.  States lidocaine patches haven't helped.  States TENS unit previously unhelpful.  Will proceed with MRI prior to considering prednisone taper.

## 2023-08-26 ENCOUNTER — Encounter: Payer: Self-pay | Admitting: Radiation Oncology

## 2023-08-26 ENCOUNTER — Ambulatory Visit
Admission: RE | Admit: 2023-08-26 | Discharge: 2023-08-26 | Disposition: A | Discharge status: Home / Self Care | Payer: Medicare HMO | Source: Ambulatory Visit | Attending: Radiation Oncology | Admitting: Radiation Oncology

## 2023-08-26 VITALS — BP 138/82 | HR 72 | Temp 98.4°F | Resp 24

## 2023-08-26 DIAGNOSIS — Z923 Personal history of irradiation: Secondary | ICD-10-CM | POA: Diagnosis not present

## 2023-08-26 DIAGNOSIS — M5136 Other intervertebral disc degeneration, lumbar region: Secondary | ICD-10-CM | POA: Diagnosis not present

## 2023-08-26 DIAGNOSIS — Z993 Dependence on wheelchair: Secondary | ICD-10-CM | POA: Diagnosis not present

## 2023-08-26 DIAGNOSIS — M47816 Spondylosis without myelopathy or radiculopathy, lumbar region: Secondary | ICD-10-CM | POA: Diagnosis not present

## 2023-08-26 DIAGNOSIS — C61 Malignant neoplasm of prostate: Secondary | ICD-10-CM | POA: Diagnosis not present

## 2023-08-26 DIAGNOSIS — Z191 Hormone sensitive malignancy status: Secondary | ICD-10-CM | POA: Diagnosis not present

## 2023-08-26 NOTE — Progress Notes (Signed)
Radiation Oncology Follow up Note  Name: [name]   Date:   08/26/2023 MRN:  [id number] DOB: [birth year]    This 77 y.o. 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  We have been tracking his PSA which jumped to 2.39 back in March 2024 although his most recent PSA is back down to 0.29.  Patient is frail wheelchair-bound.  He recently in the past few months had scans of his lumbar spine showingNo acute fracture lumbar spine diffuse degenerative disc changes affecting L2-L3.  COMPLICATIONS OF TREATMENT: none  FOLLOW UP COMPLIANCE: keeps appointments   PHYSICAL EXAM:  BP 138/82   Pulse 72   Temp 98.4 F (36.9 C)   Resp (!) 24   SpO2 95%  Frail-appearing elderly gentleman wheelchair-bound in NAD.  Well-developed well-nourished patient in NAD. HEENT reveals PERLA, EOMI, discs not visualized.  Oral cavity is clear. No oral mucosal lesions are identified. Neck is clear without evidence of cervical or supraclavicular adenopathy. Lungs are clear to A&P. Cardiac examination is essentially unremarkable with regular rate and rhythm without murmur rub or thrill. Abdomen is benign with no organomegaly or masses noted. Motor sensory and DTR levels are equal and symmetric in the upper and lower extremities. Cranial nerves II through XII are grossly intact. Proprioception is intact. No peripheral adenopathy or edema is identified. No motor or sensory levels are noted. Crude visual fields are within normal range.  RADIOLOGY RESULTS: Lumbar series reviewed compatible with above-stated findings  PLAN: At this time I am going to turn follow-up care over to urology and his GP.  I would anticipate yearly PSAs.  Should he continue to show  trend upwards in his PSA would be happy to reevaluate him or refer him to urology for possible ADT treatment.  Patient comprehends my recommendations well.  I would like to take this opportunity to thank you for allowing me to participate in the care of your patient.Carmina Miller, MD

## 2023-08-27 ENCOUNTER — Telehealth: Payer: Self-pay | Admitting: Family Medicine

## 2023-08-27 NOTE — Telephone Encounter (Signed)
Melissa, from [facility] MRI service center contacted the office regarding MRI referral for patient at [facility]. Caller says that the place of service listed on the referral is listed as "In office" which is incorrect, and said it needs to be changed to either "On Campus or Outpatient Center". Melissa requested a call back when able at [phone] ext. [phone]

## 2023-08-28 ENCOUNTER — Encounter: Payer: Self-pay | Admitting: *Deleted

## 2023-08-28 NOTE — Addendum Note (Signed)
Addended by: Eustaquio Boyden on: 08/28/2023 02:02 PM   Modules accepted: Orders

## 2023-08-28 NOTE — Telephone Encounter (Signed)
New order placed. Let me know if any trouble with this.

## 2023-08-28 NOTE — Telephone Encounter (Signed)
Lvm asking Melissa to call back. Need to notify her Dr Reece Agar placed new lumbar MRI order.

## 2023-08-31 ENCOUNTER — Ambulatory Visit
Admission: RE | Admit: 2023-08-31 | Discharge: 2023-08-31 | Disposition: A | Discharge status: Home / Self Care | Payer: Medicare HMO | Source: Ambulatory Visit | Attending: Family Medicine | Admitting: Family Medicine

## 2023-08-31 DIAGNOSIS — C61 Malignant neoplasm of prostate: Secondary | ICD-10-CM | POA: Diagnosis not present

## 2023-08-31 DIAGNOSIS — M48061 Spinal stenosis, lumbar region without neurogenic claudication: Secondary | ICD-10-CM | POA: Diagnosis not present

## 2023-08-31 DIAGNOSIS — M4807 Spinal stenosis, lumbosacral region: Secondary | ICD-10-CM | POA: Diagnosis not present

## 2023-08-31 DIAGNOSIS — G8929 Other chronic pain: Secondary | ICD-10-CM | POA: Diagnosis not present

## 2023-08-31 DIAGNOSIS — M5126 Other intervertebral disc displacement, lumbar region: Secondary | ICD-10-CM | POA: Diagnosis not present

## 2023-08-31 DIAGNOSIS — M545 Low back pain, unspecified: Secondary | ICD-10-CM | POA: Diagnosis not present

## 2023-08-31 NOTE — Telephone Encounter (Signed)
Looks like MRI is scheduled for today at 4:00.

## 2023-09-09 ENCOUNTER — Other Ambulatory Visit: Payer: Self-pay | Admitting: Family Medicine

## 2023-09-13 ENCOUNTER — Other Ambulatory Visit: Payer: Self-pay | Admitting: Family Medicine

## 2023-09-13 DIAGNOSIS — E782 Mixed hyperlipidemia: Secondary | ICD-10-CM

## 2023-09-13 DIAGNOSIS — E1140 Type 2 diabetes mellitus with diabetic neuropathy, unspecified: Secondary | ICD-10-CM

## 2023-09-13 DIAGNOSIS — E559 Vitamin D deficiency, unspecified: Secondary | ICD-10-CM

## 2023-09-13 DIAGNOSIS — D509 Iron deficiency anemia, unspecified: Secondary | ICD-10-CM

## 2023-09-13 DIAGNOSIS — E538 Deficiency of other specified B group vitamins: Secondary | ICD-10-CM

## 2023-09-15 ENCOUNTER — Other Ambulatory Visit (INDEPENDENT_AMBULATORY_CARE_PROVIDER_SITE_OTHER): Payer: Medicare HMO

## 2023-09-15 DIAGNOSIS — E559 Vitamin D deficiency, unspecified: Secondary | ICD-10-CM | POA: Diagnosis not present

## 2023-09-15 DIAGNOSIS — E538 Deficiency of other specified B group vitamins: Secondary | ICD-10-CM

## 2023-09-15 DIAGNOSIS — E1140 Type 2 diabetes mellitus with diabetic neuropathy, unspecified: Secondary | ICD-10-CM | POA: Diagnosis not present

## 2023-09-15 DIAGNOSIS — E782 Mixed hyperlipidemia: Secondary | ICD-10-CM | POA: Diagnosis not present

## 2023-09-15 DIAGNOSIS — D509 Iron deficiency anemia, unspecified: Secondary | ICD-10-CM | POA: Diagnosis not present

## 2023-09-15 LAB — COMPREHENSIVE METABOLIC PANEL
ALT: 8 U/L (ref 0–53)
AST: 11 U/L (ref 0–37)
Albumin: 3.5 g/dL (ref 3.5–5.2)
Alkaline Phosphatase: 74 U/L (ref 39–117)
BUN: 14 mg/dL (ref 6–23)
CO2: 31 meq/L (ref 19–32)
Calcium: 8.6 mg/dL (ref 8.4–10.5)
Chloride: 101 meq/L (ref 96–112)
Creatinine, Ser: 0.99 mg/dL (ref 0.40–1.50)
GFR: 72.52 mL/min (ref >60.00)
Glucose, Bld: 119 mg/dL — ABNORMAL HIGH (ref 70–99)
Potassium: 4.6 meq/L (ref 3.5–5.1)
Sodium: 140 meq/L (ref 135–145)
Total Bilirubin: 0.5 mg/dL (ref 0.2–1.2)
Total Protein: 5.8 g/dL — ABNORMAL LOW (ref 6.0–8.3)

## 2023-09-15 LAB — CBC WITH DIFFERENTIAL/PLATELET
Basophils Absolute: 0 10*3/uL (ref 0.0–0.1)
Basophils Relative: 0.5 % (ref 0.0–3.0)
Eosinophils Absolute: 0.3 10*3/uL (ref 0.0–0.7)
Eosinophils Relative: 3.7 % (ref 0.0–5.0)
HCT: 41.5 % (ref 39.0–52.0)
Hemoglobin: 13.1 g/dL (ref 13.0–17.0)
Lymphocytes Relative: 37.6 % (ref 12.0–46.0)
Lymphs Abs: 2.6 10*3/uL (ref 0.7–4.0)
MCHC: 31.7 g/dL (ref 30.0–36.0)
MCV: 95.4 fL (ref 78.0–100.0)
Monocytes Absolute: 0.6 10*3/uL (ref 0.1–1.0)
Monocytes Relative: 8.8 % (ref 3.0–12.0)
Neutro Abs: 3.4 10*3/uL (ref 1.4–7.7)
Neutrophils Relative %: 49.4 % (ref 43.0–77.0)
Platelets: 224 10*3/uL (ref 150.0–400.0)
RBC: 4.35 Mil/uL (ref 4.22–5.81)
RDW: 14.8 % (ref 11.5–15.5)
WBC: 6.9 10*3/uL (ref 4.0–10.5)

## 2023-09-15 LAB — IBC PANEL
Iron: 70 ug/dL (ref 42–165)
Saturation Ratios: 18.3 % — ABNORMAL LOW (ref 20.0–50.0)
TIBC: 382.2 ug/dL (ref 250.0–450.0)
Transferrin: 273 mg/dL (ref 212.0–360.0)

## 2023-09-15 LAB — VITAMIN D 25 HYDROXY (VIT D DEFICIENCY, FRACTURES): VITD: 26.01 ng/mL — ABNORMAL LOW (ref 30.00–100.00)

## 2023-09-15 LAB — LIPID PANEL
Cholesterol: 130 mg/dL (ref 0–200)
HDL: 31.8 mg/dL — ABNORMAL LOW (ref >39.00)
LDL Cholesterol: 64 mg/dL (ref 0–99)
NonHDL: 98.23
Total CHOL/HDL Ratio: 4
Triglycerides: 170 mg/dL — ABNORMAL HIGH (ref 0.0–149.0)
VLDL: 34 mg/dL (ref 0.0–40.0)

## 2023-09-15 LAB — HEMOGLOBIN A1C: Hgb A1c MFr Bld: 7.8 % — ABNORMAL HIGH (ref 4.6–6.5)

## 2023-09-15 LAB — FERRITIN: Ferritin: 24.7 ng/mL (ref 22.0–322.0)

## 2023-09-15 LAB — VITAMIN B12: Vitamin B-12: 777 pg/mL (ref 211–911)

## 2023-09-15 NOTE — Addendum Note (Signed)
Addended by: Alvina Chou on: 09/15/2023 12:07 PM   Modules accepted: Orders

## 2023-09-16 ENCOUNTER — Other Ambulatory Visit: Payer: Self-pay

## 2023-09-16 DIAGNOSIS — E1140 Type 2 diabetes mellitus with diabetic neuropathy, unspecified: Secondary | ICD-10-CM

## 2023-09-17 ENCOUNTER — Encounter: Payer: Self-pay | Admitting: Family Medicine

## 2023-09-17 DIAGNOSIS — G894 Chronic pain syndrome: Secondary | ICD-10-CM

## 2023-09-17 LAB — MICROALBUMIN / CREATININE URINE RATIO
Creatinine,U: 96.1 mg/dL
Microalb Creat Ratio: 2.7 mg/g (ref 0.0–30.0)
Microalb, Ur: 2.6 mg/dL — ABNORMAL HIGH (ref 0.0–1.9)

## 2023-09-17 MED ORDER — HYDROCODONE-ACETAMINOPHEN 10-325 MG PO TABS: 1.0000 | ORAL_TABLET | Freq: Four times a day (QID) | ORAL | 0 refills | Status: DC | PRN

## 2023-09-17 NOTE — Telephone Encounter (Signed)
Can we check on MRI from 09/01/2023 not yet read? Thanks.

## 2023-09-22 ENCOUNTER — Ambulatory Visit: Payer: Medicare HMO | Admitting: Family Medicine

## 2023-09-22 ENCOUNTER — Encounter: Payer: Self-pay | Admitting: Family Medicine

## 2023-09-22 VITALS — BP 124/80 | HR 73 | Temp 98.5°F | Ht 71.25 in | Wt 219.1 lb

## 2023-09-22 DIAGNOSIS — Z0000 Encounter for general adult medical examination without abnormal findings: Secondary | ICD-10-CM

## 2023-09-22 DIAGNOSIS — G894 Chronic pain syndrome: Secondary | ICD-10-CM

## 2023-09-22 DIAGNOSIS — I7141 Pararenal abdominal aortic aneurysm, without rupture: Secondary | ICD-10-CM

## 2023-09-22 DIAGNOSIS — Z7901 Long term (current) use of anticoagulants: Secondary | ICD-10-CM

## 2023-09-22 DIAGNOSIS — E1140 Type 2 diabetes mellitus with diabetic neuropathy, unspecified: Secondary | ICD-10-CM

## 2023-09-22 DIAGNOSIS — D509 Iron deficiency anemia, unspecified: Secondary | ICD-10-CM

## 2023-09-22 DIAGNOSIS — Z7189 Other specified counseling: Secondary | ICD-10-CM | POA: Diagnosis not present

## 2023-09-22 DIAGNOSIS — G4733 Obstructive sleep apnea (adult) (pediatric): Secondary | ICD-10-CM

## 2023-09-22 DIAGNOSIS — Z0001 Encounter for general adult medical examination with abnormal findings: Secondary | ICD-10-CM

## 2023-09-22 DIAGNOSIS — F1120 Opioid dependence, uncomplicated: Secondary | ICD-10-CM | POA: Diagnosis not present

## 2023-09-22 DIAGNOSIS — E782 Mixed hyperlipidemia: Secondary | ICD-10-CM

## 2023-09-22 DIAGNOSIS — G6289 Other specified polyneuropathies: Secondary | ICD-10-CM | POA: Diagnosis not present

## 2023-09-22 DIAGNOSIS — C61 Malignant neoplasm of prostate: Secondary | ICD-10-CM

## 2023-09-22 DIAGNOSIS — I10 Essential (primary) hypertension: Secondary | ICD-10-CM | POA: Diagnosis not present

## 2023-09-22 DIAGNOSIS — G8929 Other chronic pain: Secondary | ICD-10-CM

## 2023-09-22 DIAGNOSIS — Z720 Tobacco use: Secondary | ICD-10-CM

## 2023-09-22 DIAGNOSIS — I495 Sick sinus syndrome: Secondary | ICD-10-CM

## 2023-09-22 DIAGNOSIS — Z7409 Other reduced mobility: Secondary | ICD-10-CM

## 2023-09-22 DIAGNOSIS — J449 Chronic obstructive pulmonary disease, unspecified: Secondary | ICD-10-CM | POA: Diagnosis not present

## 2023-09-22 DIAGNOSIS — I5032 Chronic diastolic (congestive) heart failure: Secondary | ICD-10-CM

## 2023-09-22 DIAGNOSIS — M545 Low back pain, unspecified: Secondary | ICD-10-CM

## 2023-09-22 DIAGNOSIS — E538 Deficiency of other specified B group vitamins: Secondary | ICD-10-CM

## 2023-09-22 DIAGNOSIS — E1142 Type 2 diabetes mellitus with diabetic polyneuropathy: Secondary | ICD-10-CM

## 2023-09-22 DIAGNOSIS — I714 Abdominal aortic aneurysm, without rupture, unspecified: Secondary | ICD-10-CM

## 2023-09-22 DIAGNOSIS — E559 Vitamin D deficiency, unspecified: Secondary | ICD-10-CM

## 2023-09-22 DIAGNOSIS — K760 Fatty (change of) liver, not elsewhere classified: Secondary | ICD-10-CM

## 2023-09-22 DIAGNOSIS — I480 Paroxysmal atrial fibrillation: Secondary | ICD-10-CM

## 2023-09-22 MED ORDER — METFORMIN HCL ER 500 MG PO TB24: 500.0000 mg | ORAL_TABLET | Freq: Two times a day (BID) | ORAL | 4 refills | Status: DC

## 2023-09-22 MED ORDER — BREZTRI AEROSPHERE 160-9-4.8 MCG/ACT IN AERO: 2.0000 | INHALATION_SPRAY | Freq: Two times a day (BID) | RESPIRATORY_TRACT | 3 refills | Status: AC

## 2023-09-22 MED ORDER — METHOCARBAMOL 500 MG PO TABS: 500.0000 mg | ORAL_TABLET | Freq: Three times a day (TID) | ORAL | 1 refills | Status: DC | PRN

## 2023-09-22 MED ORDER — IPRATROPIUM-ALBUTEROL 0.5-2.5 (3) MG/3ML IN SOLN: 3.0000 mL | Freq: Four times a day (QID) | RESPIRATORY_TRACT | 6 refills | Status: AC | PRN

## 2023-09-22 MED ORDER — OMEPRAZOLE 40 MG PO CPDR: 40.0000 mg | DELAYED_RELEASE_CAPSULE | Freq: Every day | ORAL | 4 refills | Status: DC

## 2023-09-22 MED ORDER — PRAVASTATIN SODIUM 20 MG PO TABS: ORAL_TABLET | ORAL | 4 refills | Status: DC

## 2023-09-22 MED ORDER — GABAPENTIN 600 MG PO TABS
1200.0000 mg | ORAL_TABLET | Freq: Two times a day (BID) | ORAL | 1 refills | Status: DC
Start: 2023-09-22 — End: 2024-06-27

## 2023-09-22 MED ORDER — SPIRONOLACTONE 25 MG PO TABS
12.5000 mg | ORAL_TABLET | Freq: Every day | ORAL | 4 refills | Status: DC
Start: 2023-09-22 — End: 2024-10-07

## 2023-09-22 NOTE — Progress Notes (Unsigned)
Ph: [phone] Fax: [phone]   Patient ID: [name], male    DOB: March 22, 1946, 77 y.o.   MRN: [id number]  This visit was conducted in person.  BP 124/80   Pulse 73   Temp 98.5 F (36.9 C) (Temporal)   Ht 5' 11.25" (1.81 m)   Wt 219 lb 2 oz (99.4 kg)   SpO2 95%   BMI 30.35 kg/m    CC: CPE Subjective:   HPI: [name] is a 77 y.o. male presenting on 09/22/2023 for Medicare Wellness (Pt accompanied by wife, [name]. )   Did not see health advisor this year.   Hearing Screening   500Hz  1000Hz  2000Hz  4000Hz   Right ear 40 40 25 0  Left ear 25 40 40 0  Comments: Pt wears B hearing aids. Not wearing at today's OV.   Vision Screening - Comments:: Last eye exam, 08/2023.  Flowsheet Row Office Visit from 09/22/2023 in [facility]  PHQ-2 Total Score 2          09/22/2023    3:20 PM 08/24/2023   10:48 AM 07/31/2023    3:55 PM 05/06/2023   10:21 AM 01/16/2023   10:14 AM  Fall Risk   Falls in the past year? 0 1 1 1  0  Number falls in past yr:  1 0 0   Injury with Fall?  0 0 0   Risk for fall due to :   History of fall(s)    Follow up   Falls evaluation completed    Fall with injury - see prior notes.   Chronic low back pain with acute exacerbation since fall 06/2023, R sciatic pain has since improved. Residual severe midline lumbar spine pain associated with worsening urinary urge with occasional incontinence, no bowel symptoms. Debilitating activity limiting pain, most of day is spent supine in bed due to pain.  Last visit we discussed trying stronger opiate oxycodone 5/325mg  as hydrocodone 10/325mg  wasn't helping - hasn't tried oxycodone yet.  Notes ongoing R lower back pain with radiation to lateral leg and now down R kneecap.  Pain so far uncontrolled with regular use of hydrocodone, robaxin, gabapentin.  States lidocaine patches haven't helped, TENS unit previously unhelpful.   Worse pain with sitting up, standing, walking.  Ambulation limited due to back pain. Notes marked improvement in pain when laying down as it decreases pressure on back.   Ongoing R forearm pain treating with arthritis cream and voltaren gel.   Preventative: Colon cancer screening - h/o IDA that resolved with replacement. Last saw GI [name]) 12/2017 - hesitant at that time for luminal evaluation given comorbidities. Never returned iFOB. He had reassuring abd/pelvic CT. From GI's last note: "If he were to consent to a procedure or sedation, further cardiac clearance would be required due his bradycardia". Denies blood in stool.  Prostate cancer treatment - completed radiation therapy ([name]). Saw last month.  Lung cancer screening - aged out. PAH on chest CT 01/2018.  Flu shot - declined COVID vaccine - Pfizer 02/2020, 03/2020 Prevnar-20 09/2021 Td 04/2020  Shingrix - declines  Advanced directive discussion - working on this at home. Wife would be HCPOA. Encouraged he finish filling out.  Seat belt use discussed Sunscreen use discussed, no changing moles.  Smoker - <1 ppd longterm smoker (53 yrs) - chantix didn't help (vivid dreams).  Alcohol - rare  Dentist - has upper dentures, lost lower dentures - no luck getting fitted  again despite multiple attempts  Eye exam - Dr [name] 08/17/2023. S/p cataract surgery by Dr Jim Link Bowel - no constipation  Bladder - no incontinence but does not urinary urgency, uses bottle urinal at night  Caffeine: 1 cup coffee, 1/2 gallon unsweet tea, 2 soda/day Lives with wife and 1 dog, 6 cats outside. 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     Relevant past medical, surgical, family and social history reviewed and updated as indicated. Interim medical history since our last visit reviewed. Allergies and medications reviewed and updated. Outpatient Medications Prior to Visit  Medication Sig Dispense Refill   albuterol (VENTOLIN HFA) 108 (90 Base)  MCG/ACT inhaler INHALE 2 PUFFS INTO THE LUNGS EVERY 6 HOURS AS NEEDED FOR WHEEZING OR SHORTNESS OF BREATH 18 g 3   amiodarone (PACERONE) 200 MG tablet Take 1 tablet (200 mg total) by mouth as needed. Take for fast heart rate>100 60 tablet 3   aspirin 81 MG EC tablet Take 1 tablet (81 mg total) by mouth daily. Swallow whole.     Cholecalciferol (VITAMIN D) 50 MCG (2000 UT) CAPS Take 1 capsule (2,000 Units total) by mouth daily. 30 capsule    Coenzyme Q10 (COQ-10 PO) Take 1 tablet by mouth daily.     Cyanocobalamin (B-12) 1000 MCG SUBL Place 1 tablet under the tongue every Monday, Wednesday, and Friday. 30 tablet    diclofenac Sodium (VOLTAREN) 1 % GEL Apply 4 g topically 3 (three) times daily. As needed 100 g 3   diltiazem (CARDIZEM) 30 MG tablet Take 1 tablet by mouth daily as needed for fast heart rate 30 tablet 5   diphenoxylate-atropine (LOMOTIL) 2.5-0.025 MG tablet Take 1 tablet by mouth 2 (two) times daily as needed. 20 tablet 0   empagliflozin (JARDIANCE) 10 MG TABS tablet Take 1 tablet (10 mg total) by mouth daily before breakfast. 90 tablet 1   Ferrous Sulfate (IRON) 325 (65 Fe) MG TABS Take by mouth daily.     fish oil-omega-3 fatty acids 1000 MG capsule Take 2 g by mouth daily.     furosemide (LASIX) 20 MG tablet TAKE 1 TABLET BY MOUTH EVERY DAY AS NEEDED FOR FLUID RETENTION 90 tablet 0   gatifloxacin (ZYMAXID) 0.5 % SOLN SMARTSIG:In Eye(s)     HYDROcodone-acetaminophen (NORCO) 10-325 MG tablet Take 1 tablet by mouth every 6 (six) hours as needed for moderate pain or severe pain. 120 tablet 0   loratadine (CLARITIN) 10 MG tablet Take 1 tablet (10 mg total) by mouth daily.     metoprolol tartrate (LOPRESSOR) 25 MG tablet TAKE 1 TABLET BY MOUTH TWICE DAILY FOR FAST HEART RATE>100 180 tablet 0   midodrine (PROAMATINE) 10 MG tablet Take 1 tablet (10 mg total) by mouth 3 (three) times daily as needed. 90 tablet 1   nitroGLYCERIN (NITROLINGUAL) 0.4 MG/SPRAY spray USE 1 SPRAY AS DIRECTED EVERY 5  MINUTES AS NEEDED 4.9 g 1   prednisoLONE acetate (PRED FORTE) 1 % ophthalmic suspension SMARTSIG:In Eye(s)     warfarin (COUMADIN) 2.5 MG tablet TAKE 1 1/2 TABLETS BY MOUTH EVERY DAY OR AS DIRECTED AFTER ANTI-COAG CLINIC 150 tablet 0   Budeson-Glycopyrrol-Formoterol (BREZTRI AEROSPHERE) 160-9-4.8 MCG/ACT AERO Inhale 2 puffs into the lungs in the morning and at bedtime. 3 each 3   gabapentin (NEURONTIN) 600 MG tablet TAKE 1 TABLET(600 MG) BY MOUTH TWICE DAILY 180 tablet 1   ipratropium-albuterol (DUONEB) 0.5-2.5 (3) MG/3ML SOLN Take 3 mLs  by nebulization every 6 (six) hours as needed. 360 mL 6   metFORMIN (GLUCOPHAGE-XR) 500 MG 24 hr tablet TAKE 1 TABLET(500 MG) BY MOUTH TWICE DAILY WITH A MEAL 180 tablet 0   methocarbamol (ROBAXIN) 500 MG tablet TAKE 1 TABLET BY MOUTH EVERY 8 HOURS AS NEEDED FOR MUSCLE SPASMS 40 tablet 0   omeprazole (PRILOSEC) 40 MG capsule TAKE 1 CAPSULE(40 MG) BY MOUTH DAILY 90 capsule 1   pravastatin (PRAVACHOL) 20 MG tablet TAKE 1 TABLET(20 MG) BY MOUTH DAILY 90 tablet 3   spironolactone (ALDACTONE) 25 MG tablet Take 0.5 tablets (12.5 mg total) by mouth daily. 45 tablet 1   ketorolac (ACULAR) 0.5 % ophthalmic solution SMARTSIG:In Eye(s)     mupirocin cream (BACTROBAN) 2 % Apply 1 Application topically 2 (two) times daily. To affected wound on leg 15 g 0   triamcinolone cream (KENALOG) 0.5 % Apply 1 Application topically 2 (two) times daily. 30 g 0   No facility-administered medications prior to visit.     Per HPI unless specifically indicated in ROS section below Review of Systems  Constitutional:  Negative for activity change, appetite change, chills, fatigue, fever and unexpected weight change.  HENT:  Negative for hearing loss.   Eyes:  Negative for visual disturbance.  Respiratory:  Positive for cough (productive of phlegm 3x/day). Negative for chest tightness, shortness of breath and wheezing.   Cardiovascular:  Negative for chest pain, palpitations and leg  swelling.  Gastrointestinal:  Negative for abdominal distention, abdominal pain, blood in stool, constipation, diarrhea, nausea and vomiting.  Genitourinary:  Negative for difficulty urinating and hematuria.  Musculoskeletal:  Positive for arthralgias and back pain. Negative for myalgias and neck pain.  Skin:  Negative for rash.  Neurological:  Negative for dizziness, seizures, syncope and headaches.  Hematological:  Negative for adenopathy. Does not bruise/bleed easily.  Psychiatric/Behavioral:  Negative for dysphoric mood. The patient is not nervous/anxious.     Objective:  BP 124/80   Pulse 73   Temp 98.5 F (36.9 C) (Temporal)   Ht 5' 11.25" (1.81 m)   Wt 219 lb 2 oz (99.4 kg)   SpO2 95%   BMI 30.35 kg/m   Wt Readings from Last 3 Encounters:  09/22/23 219 lb 2 oz (99.4 kg)  08/24/23 210 lb 2 oz (95.3 kg)  07/03/23 211 lb 6 oz (95.9 kg)      Physical Exam Vitals and nursing note reviewed.  Constitutional:      General: He is not in acute distress.    Appearance: Normal appearance. He is well-developed. He is not ill-appearing.     Comments: In wheelchair today   HENT:     Head: Normocephalic and atraumatic.     Right Ear: Hearing, tympanic membrane, ear canal and external ear normal.     Left Ear: Hearing, tympanic membrane, ear canal and external ear normal.     Mouth/Throat:     Mouth: Mucous membranes are moist.     Pharynx: Oropharynx is clear. No oropharyngeal exudate or posterior oropharyngeal erythema.  Eyes:     General: No scleral icterus.    Extraocular Movements: Extraocular movements intact.     Conjunctiva/sclera: Conjunctivae normal.     Pupils: Pupils are equal, round, and reactive to light.  Neck:     Thyroid: No thyroid mass or thyromegaly.  Cardiovascular:     Rate and Rhythm: Normal rate and regular rhythm.     Pulses: Normal pulses.  Radial pulses are 2+ on the right side and 2+ on the left side.     Heart sounds: Normal heart sounds.  No murmur heard. Pulmonary:     Effort: Pulmonary effort is normal. No respiratory distress.     Breath sounds: Normal breath sounds. No wheezing, rhonchi or rales.  Abdominal:     General: Bowel sounds are normal. There is no distension.     Palpations: Abdomen is soft. There is no mass.     Tenderness: There is no abdominal tenderness. There is no guarding or rebound.     Hernia: No hernia is present.  Musculoskeletal:        General: Normal range of motion.     Cervical back: Normal range of motion and neck supple.     Right lower leg: No edema.     Left lower leg: No edema.  Lymphadenopathy:     Cervical: No cervical adenopathy.  Skin:    General: Skin is warm and dry.     Findings: No rash.  Neurological:     General: No focal deficit present.     Mental Status: He is alert and oriented to person, place, and time.     Comments: Declines to do calculation or recall  Psychiatric:        Mood and Affect: Mood normal.        Behavior: Behavior normal.        Thought Content: Thought content normal.        Judgment: Judgment normal.       Results for orders placed or performed in visit on 09/16/23  Microalbumin / creatinine urine ratio  Result Value Ref Range   Microalb, Ur 2.6 (H) 0.0 - 1.9 mg/dL   Creatinine,U 28.4 mg/dL   Microalb Creat Ratio 2.7 0.0 - 30.0 mg/g   *Note: Due to a large number of results and/or encounters for the requested time period, some results have not been displayed. A complete set of results can be found in Results Review.   Lab Results  Component Value Date   NA 140 09/15/2023   CL 101 09/15/2023   K 4.6 09/15/2023   CO2 31 09/15/2023   BUN 14 09/15/2023   CREATININE 0.99 09/15/2023   GFR 72.52 09/15/2023   CALCIUM 8.6 09/15/2023   PHOS 3.7 11/03/2018   ALBUMIN 3.5 09/15/2023   GLUCOSE 119 (H) 09/15/2023    Lab Results  Component Value Date   WBC 6.9 09/15/2023   HGB 13.1 09/15/2023   HCT 41.5 09/15/2023   MCV 95.4 09/15/2023    PLT 224.0 09/15/2023   Lab Results  Component Value Date   CHOL 130 09/15/2023   HDL 31.80 (L) 09/15/2023   LDLCALC 64 09/15/2023   LDLDIRECT 74.0 09/15/2022   TRIG 170.0 (H) 09/15/2023   CHOLHDL 4 09/15/2023    Lab Results  Component Value Date   IRON 70 09/15/2023   TIBC 382.2 09/15/2023   FERRITIN 24.7 09/15/2023   Lab Results  Component Value Date   VITAMINB12 777 09/15/2023   Lab Results  Component Value Date   25OHVITD2 <1.0 09/07/2017   25OHVITD3 28 09/07/2017   VD25OH 26.01 (L) 09/15/2023   Lumbar MRI IMPRESSION: Extensive artifact from vascular stent graft. L2-3: Right foraminal to extraforaminal disc bulge could possibly affect the right L2 nerve. L3-4: Moderate multifactorial stenosis which could cause neural compression in the lateral recesses. L4-5: Multifactorial stenosis of the lateral recesses but without definite neural compression. Some  potential for neural irritation. L5-S1: Left foraminal stenosis because of encroachment by osteophyte and disc material could compress the exiting L5 nerve.  Electronically Signed   By: Paulina Fusi M.D.   On: 09/21/2023 13:28  Assessment & Plan:   Problem List Items Addressed This Visit     Chronic low back pain (Fourth Area of Pain) (Bilateral) (Chronic)    Chronic low back pain with acute exacerbation since fall 06/2023, R sciatic pain has improved. Residual severe midline lumbar spine pain associated with urinary urge with occasional incontinence, no bowel symptoms. Debilitating activity limiting pain, most of day is spent supine in bed due to pain.  Pain so far uncontrolled despite regular use of hydrocodone, robaxin, gabapentin.  States lidocaine patches haven't helped, TENS unit previously unhelpful.  Recent MRI showing multifactorial stenosis to multiple levels possibly affecting R L2 nerve and left L5 nerve. Will refer to PM&R to discuss further intervention/possible steroid injections.       Relevant  Medications   gabapentin (NEURONTIN) 600 MG tablet   methocarbamol (ROBAXIN) 500 MG tablet   Other Relevant Orders   Ambulatory referral to Physical Medicine Rehab   Chronic pain syndrome (Chronic)   Relevant Medications   gabapentin (NEURONTIN) 600 MG tablet   methocarbamol (ROBAXIN) 500 MG tablet   Other Relevant Orders   Ambulatory referral to Physical Medicine Rehab   Encounter for chronic pain management (Chronic)   Opiate dependence (HCC) (Chronic)   Advanced care planning/counseling discussion (Chronic)    Advanced directive discussion - working on this at home. Wife would be HCPOA. Encouraged he finish filling out.       Medicare annual wellness visit, subsequent - Primary (Chronic)    I have personally reviewed the Medicare Annual Wellness questionnaire and have noted 1. The patient's medical and social history 2. Their use of alcohol, tobacco or illicit drugs 3. Their current medications and supplements 4. The patient's functional ability including ADL's, fall risks, home safety risks and hearing or visual impairment. Cognitive function has been assessed and addressed as indicated.  5. Diet and physical activity 6. Evidence for depression or mood disorders The patients weight, height, BMI have been recorded in the chart. I have made referrals, counseling and provided education to the patient based on review of the above and I have provided the pt with a written personalized care plan for preventive services. Provider list updated.. See scanned questionairre as needed for further documentation. Reviewed preventative protocols and updated unless pt declined.       Encounter for general adult medical examination with abnormal findings (Chronic)    Preventative protocols reviewed and updated unless pt declined. Discussed healthy diet and lifestyle.       Hyperlipidemia    Chronic, stable period on pravastatin as intolerant to higher potency statins. The 10-year ASCVD risk  score (Arnett DK, et al., 2019) is: 55.6%   Values used to calculate the score:     Age: 37 years     Sex: Male     Is Non-Hispanic African American: No     Diabetic: Yes     Tobacco smoker: Yes     Systolic Blood Pressure: 124 mmHg     Is BP treated: Yes     HDL Cholesterol: 31.8 mg/dL     Total Cholesterol: 130 mg/dL       Relevant Medications   spironolactone (ALDACTONE) 25 MG tablet   pravastatin (PRAVACHOL) 20 MG tablet   Tobacco abuse  Continue to encourage cessation. Precontemplative.       HTN (hypertension)    Chronic, stable on current regimen - continue.       Relevant Medications   spironolactone (ALDACTONE) 25 MG tablet   pravastatin (PRAVACHOL) 20 MG tablet   Atrial fibrillation (HCC)    Continues metoprolol 25mg , diltiazem 30mg  and amiodarone PRN.      Relevant Medications   spironolactone (ALDACTONE) 25 MG tablet   pravastatin (PRAVACHOL) 20 MG tablet   COPD mixed type (HCC)    Continued smoking <1ppd.  Continues Breztri triple therapy inhaler + duoneb treatments PRN.  Trouble affording Markus Daft - has been receiving PAP through [employer]. Will ask pharmacist to check on ongoing eligibility for program      Relevant Medications   Budeson-Glycopyrrol-Formoterol (BREZTRI AEROSPHERE) 160-9-4.8 MCG/ACT AERO   ipratropium-albuterol (DUONEB) 0.5-2.5 (3) MG/3ML SOLN   Fatty liver   Type 2 diabetes mellitus with diabetic neuropathy (HCC)    A1c trending down, latest 7.7%. Goal for him likely <8%. Continue jardiance 10mg  and metformin XR 500mg  2 daily.  Trouble affording [employer] - has been receiving PAP through pharmaceutical company. Will ask pharmacist to check on ongoing eligibility for program      Relevant Medications   metFORMIN (GLUCOPHAGE-XR) 500 MG 24 hr tablet   pravastatin (PRAVACHOL) 20 MG tablet   Diabetic polyneuropathy (HCC)   Relevant Medications   metFORMIN (GLUCOPHAGE-XR) 500 MG 24 hr tablet   gabapentin (NEURONTIN) 600  MG tablet   methocarbamol (ROBAXIN) 500 MG tablet   pravastatin (PRAVACHOL) 20 MG tablet   AAA (abdominal aortic aneurysm) without rupture (HCC)    S/p endovascular repair.  Overdue for [facility] f/u - last seen 2021.       Relevant Medications   spironolactone (ALDACTONE) 25 MG tablet   pravastatin (PRAVACHOL) 20 MG tablet   OSA (obstructive sleep apnea)    Not on CPAP, declined. He does use nocturnal supplemental oxygen.       Current use of long term anticoagulation (Coumadin)   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HCC)   Relevant Medications   spironolactone (ALDACTONE) 25 MG tablet   pravastatin (PRAVACHOL) 20 MG tablet   Sick sinus syndrome (HCC)   Relevant Medications   spironolactone (ALDACTONE) 25 MG tablet   pravastatin (PRAVACHOL) 20 MG tablet   Iron deficiency anemia    He continues daily oral iron.       CHF (congestive heart failure) (HCC)    Continue PRN lasix 20mg       Relevant Medications   spironolactone (ALDACTONE) 25 MG tablet   pravastatin (PRAVACHOL) 20 MG tablet   Vitamin B12 deficiency    Continues oral b12 replacement.       Vitamin D insufficiency    Continues oral vit D replacement 1000 units - increase to 2000 international units  daily.       Prostate cancer [facility])    S/p XRT 2019 ([name], Her[facility]), stable period without evidence of recurrence      Peripheral neuropathy    Continues gabapentin 600mg  BID - will increase to 1200mg  BID given ongoing back pain as per below.       Relevant Medications   gabapentin (NEURONTIN) 600 MG tablet   methocarbamol (ROBAXIN) 500 MG tablet   Impaired mobility     Meds ordered this encounter  Medications   metFORMIN (GLUCOPHAGE-XR) 500 MG 24 hr tablet    Sig: Take 1 tablet (500 mg total) by mouth 2 (two) times daily with a  meal.    Dispense:  180 tablet    Refill:  4   spironolactone (ALDACTONE) 25 MG tablet    Sig: Take 0.5 tablets (12.5 mg total) by mouth daily.    Dispense:  45 tablet    Refill:  4    gabapentin (NEURONTIN) 600 MG tablet    Sig: Take 2 tablets (1,200 mg total) by mouth 2 (two) times daily.    Dispense:  360 tablet    Refill:  1    Note new dose   Budeson-Glycopyrrol-Formoterol (BREZTRI AEROSPHERE) 160-9-4.8 MCG/ACT AERO    Sig: Inhale 2 puffs into the lungs in the morning and at bedtime.    Dispense:  3 each    Refill:  3    Order Specific Question:   Lot Number?    Answer:   [id number]    Order Specific Question:   Expiration Date?    Answer:   03/08/2024    Order Specific Question:   Manufacturer?    Answer:   [employer] [[id number]]    Order Specific Question:   Quantity    Answer:   2   ipratropium-albuterol (DUONEB) 0.5-2.5 (3) MG/3ML SOLN    Sig: Take 3 mLs by nebulization every 6 (six) hours as needed.    Dispense:  360 mL    Refill:  6    Dx: J44.9   methocarbamol (ROBAXIN) 500 MG tablet    Sig: Take 1 tablet (500 mg total) by mouth every 8 (eight) hours as needed for muscle spasms.    Dispense:  180 tablet    Refill:  1   omeprazole (PRILOSEC) 40 MG capsule    Sig: Take 1 capsule (40 mg total) by mouth daily.    Dispense:  90 capsule    Refill:  4   pravastatin (PRAVACHOL) 20 MG tablet    Sig: TAKE 1 TABLET(20 MG) BY MOUTH DAILY    Dispense:  90 tablet    Refill:  4    Orders Placed This Encounter  Procedures   Ambulatory referral to Physical Medicine Rehab    Referral Priority:   Routine    Referral Type:   Rehabilitation    Referral Reason:   Specialty Services Required    Requested Specialty:   Physical Medicine and Rehabilitation    Number of Visits Requested:   1    Patient Instructions  We will refer you to physical medicine and rehab for further eval/recommendations for chronic right low back pain  May increase gabapentin to 1200mg  twice daily.  Good to see you today Return as needed or in 3-4 months for follow up visit.   Follow up plan: Return in about 4 months (around 01/23/2024) for follow up visit.  Eustaquio Boyden, MD

## 2023-09-22 NOTE — Patient Instructions (Addendum)
We will refer you to physical medicine and rehab for further eval/recommendations for chronic right low back pain  May increase gabapentin to 1200mg  twice daily.  Good to see you today Return as needed or in 3-4 months for follow up visit.

## 2023-09-23 ENCOUNTER — Encounter: Payer: Self-pay | Admitting: Family Medicine

## 2023-09-23 ENCOUNTER — Encounter: Payer: Self-pay | Admitting: *Deleted

## 2023-09-23 ENCOUNTER — Other Ambulatory Visit: Payer: Self-pay | Admitting: Pharmacist

## 2023-09-23 DIAGNOSIS — Z0001 Encounter for general adult medical examination with abnormal findings: Secondary | ICD-10-CM | POA: Insufficient documentation

## 2023-09-23 NOTE — Assessment & Plan Note (Signed)
Continue PRN lasix 20mg 

## 2023-09-23 NOTE — Assessment & Plan Note (Signed)
Chronic low back pain with acute exacerbation since fall 06/2023, R sciatic pain has improved. Residual severe midline lumbar spine pain associated with urinary urge with occasional incontinence, no bowel symptoms. Debilitating activity limiting pain, most of day is spent supine in bed due to pain.  Pain so far uncontrolled despite regular use of hydrocodone, robaxin, gabapentin.  States lidocaine patches haven't helped, TENS unit previously unhelpful.  Recent MRI showing multifactorial stenosis to multiple levels possibly affecting R L2 nerve and left L5 nerve. Will refer to PM&R to discuss further intervention/possible steroid injections.

## 2023-09-23 NOTE — Assessment & Plan Note (Signed)
Chronic, stable period on pravastatin as intolerant to higher potency statins. The 10-year ASCVD risk score (Arnett DK, et al., 2019) is: 55.6%   Values used to calculate the score:     Age: 77 years     Sex: Male     Is Non-Hispanic African American: No     Diabetic: Yes     Tobacco smoker: Yes     Systolic Blood Pressure: 124 mmHg     Is BP treated: Yes     HDL Cholesterol: 31.8 mg/dL     Total Cholesterol: 130 mg/dL

## 2023-09-23 NOTE — Assessment & Plan Note (Addendum)
Continued smoking <1ppd.  Continues Breztri triple therapy inhaler + duoneb treatments PRN.  Trouble affording Markus Daft - has been receiving PAP through [employer]. Will ask pharmacist to check on ongoing eligibility for program

## 2023-09-23 NOTE — Assessment & Plan Note (Addendum)
Continues gabapentin 600mg  BID - will increase to 1200mg  BID given ongoing back pain as per below.

## 2023-09-23 NOTE — Assessment & Plan Note (Signed)
Preventative protocols reviewed and updated unless pt declined. Discussed healthy diet and lifestyle.  

## 2023-09-23 NOTE — Assessment & Plan Note (Signed)
Advanced directive discussion - working on this at home. Wife would be HCPOA. Encouraged he finish filling out.

## 2023-09-23 NOTE — Assessment & Plan Note (Signed)

## 2023-09-23 NOTE — Assessment & Plan Note (Signed)
S/p endovascular repair.  Overdue for [facility] f/u - last seen 2021.

## 2023-09-23 NOTE — Assessment & Plan Note (Signed)
Continues oral b12 replacement.

## 2023-09-23 NOTE — Assessment & Plan Note (Addendum)
A1c trending down, latest 7.7%. Goal for him likely <8%. Continue jardiance 10mg  and metformin XR 500mg  2 daily.  Trouble affording [employer] - has been receiving PAP through pharmaceutical company. Will ask pharmacist to check on ongoing eligibility for program

## 2023-09-23 NOTE — Assessment & Plan Note (Signed)
Continue to encourage cessation. Precontemplative.

## 2023-09-23 NOTE — Assessment & Plan Note (Signed)
S/p XRT 2019 (Chrystal, Herrick), stable period without evidence of recurrence

## 2023-09-23 NOTE — Assessment & Plan Note (Signed)
Chronic, stable on current regimen - continue.

## 2023-09-23 NOTE — Progress Notes (Signed)
09/23/2023 Name: [name] MRN: [id number] DOB: 1946/08/28  Subjective  No chief complaint on file.  Care Team: Primary Care Provider: Eustaquio Boyden, MD  Reason for visit: ?  [name] is a 77 y.o. male contacted today due to medication access concerns regarding their Breztri inhaler and Jardiance. ?   Medication Access: ?  Prescription drug coverage: Payor: [employer] MEDICARE / Plan: [employer] MEDICARE HMO / Product Type: *No Product type* / .   Reports that all medications are not affordable. Breztri and Jardiance  Current Patient Assistance: None  Patient lives in a household of 2 and reports that he was previously enrolled in the [place] MAP program by Pulmonology.   AZ&Me: 300% [employer] LAMA/LABA/ICS    Breztri (glycopyrrolate/formoterol/budesonide) [name]  BI Cares: 200% [employer]PL Jardiance  Assessment and Plan:   1. Medication Access Patient lives in a household of 2 and was previously enrolled in MAP. No income change so patient is likely eligible for MAP. Notably, Jardiance criteria is a bit stricter. If denied, can consider [name] given it is available through the same program as Breztri.  Patient is not eligible for copay cards due to government insurance.  MAP applications completed and sent to provider fax for MD signature. Patient agreeable to stopping by office to sign both applications.    Future Appointments  Date Time Provider Department Center  09/30/2023  3:00 PM [facility] None   Loree Fee, PharmD Clinical Pharmacist [facility] [phone]

## 2023-09-23 NOTE — Assessment & Plan Note (Addendum)
Continues oral vit D replacement 1000 units - increase to 2000 international units  daily.

## 2023-09-23 NOTE — Assessment & Plan Note (Addendum)
Not on CPAP, declined. He does use nocturnal supplemental oxygen.

## 2023-09-23 NOTE — Assessment & Plan Note (Signed)
He continues daily oral iron.

## 2023-09-23 NOTE — Assessment & Plan Note (Signed)
Continues metoprolol 25mg , diltiazem 30mg  and amiodarone PRN.

## 2023-09-25 ENCOUNTER — Telehealth: Payer: Self-pay

## 2023-09-25 NOTE — Telephone Encounter (Signed)
Spoke with pt notifying Dr Reece Agar has signed form and now we need pt to come by and sign form. Pt verbalizes understanding and will stop by on 09/28/23.   [Placed form at front office.]  Then form needs to be emailed back to [place] at [email or web address].

## 2023-09-25 NOTE — Telephone Encounter (Signed)
Received faxed PAP form from pharmacist, Berenice Primas, for [employer]. Dr Reece Agar needs to review and sign. Then pt needs to come in and sign and we will email completed form back to [place].   Placed form in Dr Timoteo Expose box.

## 2023-09-25 NOTE — Telephone Encounter (Signed)
Signed and in Lisa's box.

## 2023-09-30 ENCOUNTER — Ambulatory Visit: Payer: Medicare HMO | Attending: Cardiovascular Disease

## 2023-09-30 DIAGNOSIS — I4891 Unspecified atrial fibrillation: Secondary | ICD-10-CM | POA: Diagnosis not present

## 2023-09-30 DIAGNOSIS — Z5181 Encounter for therapeutic drug level monitoring: Secondary | ICD-10-CM | POA: Diagnosis not present

## 2023-09-30 LAB — POCT INR: INR: 4.9 — AB (ref 2.0–3.0)

## 2023-09-30 NOTE — Patient Instructions (Signed)
HOLD TODAY and TOMORROW THEN Continue on same dosage of Warfarin 1.5 tablets daily. Recheck INR in 3 weeks

## 2023-10-08 DIAGNOSIS — Z7901 Long term (current) use of anticoagulants: Secondary | ICD-10-CM | POA: Diagnosis not present

## 2023-10-08 DIAGNOSIS — M48062 Spinal stenosis, lumbar region with neurogenic claudication: Secondary | ICD-10-CM | POA: Diagnosis not present

## 2023-10-08 DIAGNOSIS — M5416 Radiculopathy, lumbar region: Secondary | ICD-10-CM | POA: Diagnosis not present

## 2023-10-19 ENCOUNTER — Encounter: Payer: Self-pay | Admitting: Family Medicine

## 2023-10-19 ENCOUNTER — Ambulatory Visit
Admission: RE | Admit: 2023-10-19 | Discharge: 2023-10-19 | Disposition: A | Discharge status: Home / Self Care | Payer: Medicare HMO | Source: Ambulatory Visit | Attending: Family Medicine | Admitting: Family Medicine

## 2023-10-19 ENCOUNTER — Ambulatory Visit (INDEPENDENT_AMBULATORY_CARE_PROVIDER_SITE_OTHER): Payer: Medicare HMO | Admitting: Family Medicine

## 2023-10-19 VITALS — BP 120/70 | HR 67 | Temp 98.0°F | Ht 71.25 in | Wt 220.2 lb

## 2023-10-19 DIAGNOSIS — R0609 Other forms of dyspnea: Secondary | ICD-10-CM

## 2023-10-19 DIAGNOSIS — M79631 Pain in right forearm: Secondary | ICD-10-CM | POA: Diagnosis not present

## 2023-10-19 DIAGNOSIS — E1140 Type 2 diabetes mellitus with diabetic neuropathy, unspecified: Secondary | ICD-10-CM | POA: Diagnosis not present

## 2023-10-19 DIAGNOSIS — R2681 Unsteadiness on feet: Secondary | ICD-10-CM | POA: Diagnosis not present

## 2023-10-19 DIAGNOSIS — M545 Low back pain, unspecified: Secondary | ICD-10-CM

## 2023-10-19 DIAGNOSIS — G8929 Other chronic pain: Secondary | ICD-10-CM | POA: Diagnosis not present

## 2023-10-19 DIAGNOSIS — Z720 Tobacco use: Secondary | ICD-10-CM | POA: Diagnosis not present

## 2023-10-19 DIAGNOSIS — I5032 Chronic diastolic (congestive) heart failure: Secondary | ICD-10-CM

## 2023-10-19 DIAGNOSIS — I700 Atherosclerosis of aorta: Secondary | ICD-10-CM | POA: Diagnosis not present

## 2023-10-19 DIAGNOSIS — J449 Chronic obstructive pulmonary disease, unspecified: Secondary | ICD-10-CM | POA: Diagnosis not present

## 2023-10-19 MED ORDER — PREDNISONE 20 MG PO TABS: ORAL_TABLET | ORAL | 0 refills | Status: DC

## 2023-10-19 NOTE — Assessment & Plan Note (Addendum)
Exam suspicous for COPD exacerbation vs dCHF exacerbation Treat for both with prednisone taper and Lasix 20 mg 3-day course, update with effect. Ambulatory pulse ox in office today overall reassuring, maintaining oxygenation above 91% on room air. Check x-ray today as well.

## 2023-10-19 NOTE — Assessment & Plan Note (Signed)
Possible CHF exacerbation component-recommend Lasix 20 mg daily for the next 3 days with potassium, reassess symptoms after this.

## 2023-10-19 NOTE — Assessment & Plan Note (Signed)
Appreciate PM&R care Pending right L4-5 transforaminal ESI

## 2023-10-19 NOTE — Assessment & Plan Note (Signed)
Continues Breztri controller inhaler daily with albuterol inhaler as needed, using about once daily. He completed Breztri patient assistance application and I have forwarded to our pharmacist.

## 2023-10-19 NOTE — Progress Notes (Signed)
Ph: [phone] Fax: [phone]   Patient ID: [name], male    DOB: [birth year], 77 y.o.   MRN: [id number]  This visit was conducted in person.  BP 120/70   Pulse 67   Temp 98 F (36.7 C) (Oral)   Ht 5' 11.25" (1.81 m)   Wt 220 lb 4 oz (99.9 kg)   SpO2 93%   BMI 30.50 kg/m    CC: worsening dyspnea  Subjective:   HPI: [name] is a 77 y.o. male presenting on 10/19/2023 for Shortness of Breath (C/o worsening SOB in past 2 mos. Pt accompanied by wife, [name]. )   See prior note for details.  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 Has not been using duoneb treatments. In process of PAP for Breztri.   Worsening exertional dyspnea over the past month. Wife notes worsening wheezing. Increased cough productive of phlegm. Some orthopnea. No worsening leg swelling. No fever.   He is not taking furosemide 20mg       Relevant past medical, surgical, family and social history reviewed and updated as indicated. Interim medical history since our last visit reviewed. Allergies and medications reviewed and updated. Outpatient Medications Prior to Visit  Medication Sig Dispense Refill   albuterol (VENTOLIN HFA) 108 (90 Base) MCG/ACT inhaler INHALE 2 PUFFS INTO THE LUNGS EVERY 6 HOURS AS NEEDED FOR WHEEZING OR SHORTNESS OF BREATH 18 g 3   amiodarone (PACERONE) 200 MG tablet Take 1 tablet (200 mg total) by mouth as needed. Take for fast heart rate>100 60 tablet 3   aspirin 81 MG EC tablet Take 1 tablet (81 mg total) by mouth daily. Swallow whole.     Budeson-Glycopyrrol-Formoterol (BREZTRI AEROSPHERE) 160-9-4.8 MCG/ACT AERO Inhale 2 puffs into the lungs in the morning and at bedtime. 3 each 3   Cholecalciferol (VITAMIN D) 50 MCG (2000  UT) CAPS Take 1 capsule (2,000 Units total) by mouth daily. 30 capsule    Coenzyme Q10 (COQ-10 PO) Take 1 tablet by mouth daily.     Cyanocobalamin (B-12) 1000 MCG SUBL Place 1 tablet under the tongue every Monday, Wednesday, and Friday. 30 tablet    diclofenac Sodium (VOLTAREN) 1 % GEL Apply 4 g topically 3 (three) times daily. As needed 100 g 3   diltiazem (CARDIZEM) 30 MG tablet Take 1 tablet by mouth daily as needed for fast heart rate 30 tablet 5   diphenoxylate-atropine (LOMOTIL) 2.5-0.025 MG tablet Take 1 tablet by mouth 2 (two) times daily as needed. 20 tablet 0   empagliflozin (JARDIANCE) 10 MG TABS tablet Take 1 tablet (10 mg total) by mouth daily before breakfast. 90 tablet 1   Ferrous Sulfate (IRON) 325 (65 Fe) MG TABS Take by mouth daily.     fish oil-omega-3 fatty acids 1000 MG capsule Take 2 g by mouth daily.     furosemide (LASIX) 20 MG tablet TAKE 1 TABLET BY MOUTH EVERY DAY AS NEEDED FOR FLUID RETENTION 90 tablet 0   gabapentin (NEURONTIN) 600 MG tablet Take 2 tablets (1,200 mg total) by mouth 2 (two) times daily. 360 tablet 1   gatifloxacin (ZYMAXID) 0.5 % SOLN SMARTSIG:In Eye(s)     ipratropium-albuterol (DUONEB) 0.5-2.5 (3) MG/3ML SOLN Take 3 mLs by nebulization every  6 (six) hours as needed. 360 mL 6   loratadine (CLARITIN) 10 MG tablet Take 1 tablet (10 mg total) by mouth daily.     metFORMIN (GLUCOPHAGE-XR) 500 MG 24 hr tablet Take 1 tablet (500 mg total) by mouth 2 (two) times daily with a meal. 180 tablet 4   methocarbamol (ROBAXIN) 500 MG tablet Take 1 tablet (500 mg total) by mouth every 8 (eight) hours as needed for muscle spasms. 180 tablet 1   metoprolol tartrate (LOPRESSOR) 25 MG tablet TAKE 1 TABLET BY MOUTH TWICE DAILY FOR FAST HEART RATE>100 180 tablet 0   midodrine (PROAMATINE) 10 MG tablet Take 1 tablet (10 mg total) by mouth 3 (three) times daily as needed. 90 tablet 1   nitroGLYCERIN (NITROLINGUAL) 0.4 MG/SPRAY spray USE 1 SPRAY AS DIRECTED EVERY 5 MINUTES AS  NEEDED 4.9 g 1   omeprazole (PRILOSEC) 40 MG capsule Take 1 capsule (40 mg total) by mouth daily. 90 capsule 4   pravastatin (PRAVACHOL) 20 MG tablet TAKE 1 TABLET(20 MG) BY MOUTH DAILY 90 tablet 4   prednisoLONE acetate (PRED FORTE) 1 % ophthalmic suspension SMARTSIG:In Eye(s)     spironolactone (ALDACTONE) 25 MG tablet Take 0.5 tablets (12.5 mg total) by mouth daily. 45 tablet 4   warfarin (COUMADIN) 2.5 MG tablet TAKE 1 1/2 TABLETS BY MOUTH EVERY DAY OR AS DIRECTED AFTER ANTI-COAG CLINIC 150 tablet 0   HYDROcodone-acetaminophen (NORCO) 10-325 MG tablet Take 1 tablet by mouth every 6 (six) hours as needed for moderate pain or severe pain. 120 tablet 0   No facility-administered medications prior to visit.     Per HPI unless specifically indicated in ROS section below Review of Systems  Objective:  BP 120/70   Pulse 67   Temp 98 F (36.7 C) (Oral)   Ht 5' 11.25" (1.81 m)   Wt 220 lb 4 oz (99.9 kg)   SpO2 93%   BMI 30.50 kg/m   Wt Readings from Last 3 Encounters:  10/19/23 220 lb 4 oz (99.9 kg)  09/22/23 219 lb 2 oz (99.4 kg)  08/24/23 210 lb 2 oz (95.3 kg)      Physical Exam Vitals and nursing note reviewed.  Constitutional:      Appearance: Normal appearance. He is not ill-appearing.  HENT:     Head: Normocephalic and atraumatic.     Right Ear: Hearing normal.     Left Ear: Hearing normal.     Nose: No mucosal edema.     Right Turbinates: Not enlarged or swollen.     Left Turbinates: Not enlarged or swollen.     Right Sinus: No maxillary sinus tenderness or frontal sinus tenderness.     Left Sinus: No maxillary sinus tenderness or frontal sinus tenderness.     Mouth/Throat:     Mouth: Mucous membranes are dry.     Pharynx: Oropharynx is clear. No oropharyngeal exudate or posterior oropharyngeal erythema.  Eyes:     Extraocular Movements: Extraocular movements intact.     Conjunctiva/sclera: Conjunctivae normal.     Pupils: Pupils are equal, round, and reactive to  light.  Cardiovascular:     Rate and Rhythm: Normal rate and regular rhythm.     Pulses: Normal pulses.     Heart sounds: Normal heart sounds. No murmur heard. Pulmonary:     Effort: Pulmonary effort is normal. No respiratory distress.     Breath sounds: Wheezing (mild), rhonchi and rales (RLL) present.     Comments:  Coarse  bibasilarly Musculoskeletal:     Cervical back: Normal range of motion and neck supple. No rigidity.     Right lower leg: No edema.     Left lower leg: No edema.  Lymphadenopathy:     Cervical: No cervical adenopathy.  Skin:    General: Skin is warm and dry.     Findings: Bruising present. No rash.     Comments: Developing small bruise /hematoma to left upper thoracic region   Neurological:     Mental Status: He is alert.  Psychiatric:        Mood and Affect: Mood normal.        Behavior: Behavior normal.       Results for orders placed or performed in visit on 09/30/23  POCT INR  Result Value Ref Range   INR 4.9 (A) 2.0 - 3.0   POC INR     *Note: Due to a large number of results and/or encounters for the requested time period, some results have not been displayed. A complete set of results can be found in Results Review.    Assessment & Plan:   Problem List Items Addressed This Visit     Chronic low back pain (Fourth Area of Pain) (Bilateral) (Chronic)    Appreciate PM&R care Pending right L4-5 transforaminal ESI      Relevant Medications   predniSONE (DELTASONE) 20 MG tablet   Tobacco abuse    Down to 1/2 ppd - encouraged full cessation.       COPD mixed type (HCC)    Continues Breztri controller inhaler daily with albuterol inhaler as needed, using about once daily. He completed Breztri patient assistance application and I have forwarded to our pharmacist.      Relevant Medications   predniSONE (DELTASONE) 20 MG tablet   Exertional dyspnea - Primary    Exam suspicous for COPD exacerbation vs dCHF exacerbation Treat for both with  prednisone taper and Lasix 20 mg 3-day course, update with effect. Ambulatory pulse ox in office today overall reassuring, maintaining oxygenation above 91% on room air. Check x-ray today as well.      Relevant Orders   DG Chest 2 View   Type 2 diabetes mellitus with diabetic neuropathy (HCC)    Latest A1c 7.7, caution with hyperglycemia with prescribed steroid taper.      Chronic heart failure with preserved ejection fraction (HFpEF) (HCC)    Possible CHF exacerbation component-recommend Lasix 20 mg daily for the next 3 days with potassium, reassess symptoms after this.      General unsteadiness    Multifactorial with component of deconditioning. Continues with wheelchair use      Right forearm pain    Chronic, present for over a year, forearm imaging has been unrevealing to date.  Suspect cervical radiculopathy, would offer further evaluation if desired with neck imaging.        Meds ordered this encounter  Medications   predniSONE (DELTASONE) 20 MG tablet    Sig: Take two tablets daily for 3 days followed by one tablet daily for 4 days    Dispense:  10 tablet    Refill:  0    Orders Placed This Encounter  Procedures   DG Chest 2 View    Standing Status:   Future    Number of Occurrences:   1    Standing Expiration Date:   10/18/2024    Order Specific Question:   Reason for Exam (SYMPTOM  OR DIAGNOSIS REQUIRED)  Answer:   worsening exertional dyspnea x 1 month, RLL rhonchi/rales    Order Specific Question:   Preferred imaging location?    Answer:   [facility]    Patient Instructions  Chest xray today  Walking oxygen test today - overall ok.  Take prednisone taper sent to pharmacy.  I also want you to take lasix 20mg  daily for the next 3 days (take with potassium).  Update [place] with effect of above.   Follow up plan: Return if symptoms worsen or fail to improve.  Eustaquio Boyden, MD

## 2023-10-19 NOTE — Assessment & Plan Note (Signed)
Multifactorial with component of deconditioning. Continues with wheelchair use

## 2023-10-19 NOTE — Assessment & Plan Note (Addendum)
Down to 1/2 ppd - encouraged full cessation.

## 2023-10-19 NOTE — Patient Instructions (Addendum)
Chest xray today  Walking oxygen test today - overall ok.  Take prednisone taper sent to pharmacy.  I also want you to take lasix 20mg  daily for the next 3 days (take with potassium).  Update [place] with effect of above.

## 2023-10-19 NOTE — Assessment & Plan Note (Addendum)
Latest A1c 7.7, caution with hyperglycemia with prescribed steroid taper.

## 2023-10-19 NOTE — Assessment & Plan Note (Addendum)
Chronic, present for over a year, forearm imaging has been unrevealing to date.  Suspect cervical radiculopathy, would offer further evaluation if desired with neck imaging.

## 2023-10-20 ENCOUNTER — Other Ambulatory Visit: Payer: Self-pay | Admitting: Family Medicine

## 2023-10-20 DIAGNOSIS — G894 Chronic pain syndrome: Secondary | ICD-10-CM

## 2023-10-21 ENCOUNTER — Encounter: Payer: Self-pay | Admitting: Pharmacist

## 2023-10-21 ENCOUNTER — Other Ambulatory Visit: Payer: Self-pay | Admitting: Cardiovascular Disease

## 2023-10-21 ENCOUNTER — Ambulatory Visit: Payer: Medicare HMO | Attending: Cardiovascular Disease

## 2023-10-21 DIAGNOSIS — I4891 Unspecified atrial fibrillation: Secondary | ICD-10-CM | POA: Diagnosis not present

## 2023-10-21 DIAGNOSIS — Z5181 Encounter for therapeutic drug level monitoring: Secondary | ICD-10-CM | POA: Diagnosis not present

## 2023-10-21 LAB — POCT INR: INR: 2.5 (ref 2.0–3.0)

## 2023-10-21 MED ORDER — HYDROCODONE-ACETAMINOPHEN 10-325 MG PO TABS: 1.0000 | ORAL_TABLET | Freq: Four times a day (QID) | ORAL | 0 refills | Status: DC | PRN

## 2023-10-21 NOTE — Telephone Encounter (Signed)
ERx 

## 2023-10-21 NOTE — Telephone Encounter (Signed)
Please advise, see below.   Last OV: 10/19/2023 Next OV: Not scheduled

## 2023-10-21 NOTE — Telephone Encounter (Signed)
Refill request

## 2023-10-21 NOTE — Patient Instructions (Signed)
While on Prednisone until 11/18 take 1 tablet on Friday and Sunday.  Continue on same dosage of Warfarin 1.5 tablets daily. Recheck INR in 4 weeks

## 2023-11-18 ENCOUNTER — Ambulatory Visit: Payer: Medicare HMO

## 2023-11-18 ENCOUNTER — Encounter: Payer: Self-pay | Admitting: Family Medicine

## 2023-11-18 ENCOUNTER — Ambulatory Visit: Payer: Medicare HMO | Admitting: Family Medicine

## 2023-11-18 VITALS — BP 126/78 | Temp 97.8°F | Ht 71.0 in | Wt 222.0 lb

## 2023-11-18 DIAGNOSIS — G8929 Other chronic pain: Secondary | ICD-10-CM | POA: Diagnosis not present

## 2023-11-18 DIAGNOSIS — E1140 Type 2 diabetes mellitus with diabetic neuropathy, unspecified: Secondary | ICD-10-CM

## 2023-11-18 DIAGNOSIS — R300 Dysuria: Secondary | ICD-10-CM | POA: Diagnosis not present

## 2023-11-18 DIAGNOSIS — M79631 Pain in right forearm: Secondary | ICD-10-CM

## 2023-11-18 LAB — POC URINALSYSI DIPSTICK (AUTOMATED)
Glucose, UA: NEGATIVE
Ketones, UA: NEGATIVE
Nitrite, UA: NEGATIVE
Protein, UA: POSITIVE — AB
Spec Grav, UA: 1.02 (ref 1.010–1.025)
Urobilinogen, UA: 0.2 U/dL
pH, UA: 6 (ref 5.0–8.0)

## 2023-11-18 MED ORDER — CEPHALEXIN 500 MG PO CAPS: 500.0000 mg | ORAL_CAPSULE | Freq: Two times a day (BID) | ORAL | 0 refills | Status: DC

## 2023-11-18 NOTE — Progress Notes (Signed)
Ph: [phone] Fax: [phone]   Patient ID: [name], male    DOB: Nov 09, 1946, 77 y.o.   MRN: [id number]  This visit was conducted in person.  BP 126/78   Temp 97.8 F (36.6 C) (Oral)   Ht 5\' 11"  (1.803 m)   Wt 222 lb (100.7 kg)   BMI 30.96 kg/m    CC: dysuria, forearm pain  Subjective:   HPI: [name] is a 77 y.o. male presenting on 11/18/2023 for Dysuria (C/o burning when urinating, frequency and urgency. Sxs started about 2 mo ago. Also, c/o ongoing R arm/wrist pain and tremors. bPt accompanied by wife, [name]. )   Chronic pain predominantly back and leg - regularly on hydrocodone/APAP 10/325mg  1 tab QID PRN #68month. Also on voltaren topically PRN, gabapentin 1200mg  BID, and methocarbamol 500mg  TID PRN.   Saw Dr Yves Dill was recommended R L4/5 TF ESI, decided against spine this as states R lower back pain has significantly improved - no longer with any back pain at rest.  He notes ongoing low back pain with ambulation.   2 mo h/o dysuria, urgency, frequency, occasional urinary incontinence. Dribbling, incomplete emptying, ache at end of stream. No fevers/chills, flank pain, nausea/vomiting.  H/o prostate cancer s/p treatment, saw Dr Rushie Chestnut.  He has not been on Jardiance for several months due to cost.  Notes ongoing chronic R arm/wrist pain predominantly to distal wrist, present since 09/2022. Pain described as strong ache, no burning, no numbness. Denies inciting trauma/injury or fall. There was noted improvement during prednisone course last month. Compression sleeve does help with pain. Needs to use muscle rub to help him sleep. Muscle relaxant helps, hydrocodone helps.  CPK level normal when last checked 09/2022  Imaging unrevealing to date as per below: RIGHT FOREARM - 2 VIEW 09/2022 COMPARISON:  Right forearm 12/09/2016. FINDINGS: Degenerative changes noted about the right wrist and elbow. No acute bony abnormality identified. No evidence of  fracture or dislocation. Peripheral vascular calcification cannot be excluded. IMPRESSION: 1. Degenerative changes noted about the right wrist and elbow. No acute bony abnormality identified. 2. Peripheral vascular disease cannot be excluded.  CT cervical spine 01/2017: There is moderate spinal stenosis at C5-6 and C6-7 due to bony hypertrophy and disc protrusion. Exit foraminal narrowing is noted at multiple levels with impression on several exiting nerve roots bilaterally.  Notes R hand stays colder than left.  Suspected possibly due to cervical radiculopathy vs tendonitis/muscle tear.  No h/o gout.     Relevant past medical, surgical, family and social history reviewed and updated as indicated. Interim medical history since our last visit reviewed. Allergies and medications reviewed and updated. Outpatient Medications Prior to Visit  Medication Sig Dispense Refill   albuterol (VENTOLIN HFA) 108 (90 Base) MCG/ACT inhaler INHALE 2 PUFFS INTO THE LUNGS EVERY 6 HOURS AS NEEDED FOR WHEEZING OR SHORTNESS OF BREATH 18 g 3   amiodarone (PACERONE) 200 MG tablet Take 1 tablet (200 mg total) by mouth as needed. Take for fast heart rate>100 60 tablet 3   aspirin 81 MG EC tablet Take 1 tablet (81 mg total) by mouth daily. Swallow whole.     Budeson-Glycopyrrol-Formoterol (BREZTRI AEROSPHERE) 160-9-4.8 MCG/ACT AERO Inhale 2 puffs into the lungs in the morning and at bedtime. 3 each 3   Cholecalciferol (VITAMIN D) 50 MCG (2000 UT) CAPS Take 1 capsule (2,000 Units total) by mouth daily. 30 capsule    Coenzyme Q10 (COQ-10 PO) Take 1 tablet by mouth daily.  Cyanocobalamin (B-12) 1000 MCG SUBL Place 1 tablet under the tongue every Monday, Wednesday, and Friday. 30 tablet    diclofenac Sodium (VOLTAREN) 1 % GEL Apply 4 g topically 3 (three) times daily. As needed 100 g 3   diltiazem (CARDIZEM) 30 MG tablet Take 1 tablet by mouth daily as needed for fast heart rate 30 tablet 5   diphenoxylate-atropine  (LOMOTIL) 2.5-0.025 MG tablet Take 1 tablet by mouth 2 (two) times daily as needed. 20 tablet 0   empagliflozin (JARDIANCE) 10 MG TABS tablet Take 1 tablet (10 mg total) by mouth daily before breakfast. 90 tablet 1   Ferrous Sulfate (IRON) 325 (65 Fe) MG TABS Take by mouth daily.     fish oil-omega-3 fatty acids 1000 MG capsule Take 2 g by mouth daily.     furosemide (LASIX) 20 MG tablet TAKE 1 TABLET BY MOUTH EVERY DAY AS NEEDED FOR FLUID RETENTION 90 tablet 0   gabapentin (NEURONTIN) 600 MG tablet Take 2 tablets (1,200 mg total) by mouth 2 (two) times daily. 360 tablet 1   HYDROcodone-acetaminophen (NORCO) 10-325 MG tablet Take 1 tablet by mouth every 6 (six) hours as needed for moderate pain (pain score 4-6) or severe pain (pain score 7-10). 120 tablet 0   ipratropium-albuterol (DUONEB) 0.5-2.5 (3) MG/3ML SOLN Take 3 mLs by nebulization every 6 (six) hours as needed. 360 mL 6   loratadine (CLARITIN) 10 MG tablet Take 1 tablet (10 mg total) by mouth daily.     metFORMIN (GLUCOPHAGE-XR) 500 MG 24 hr tablet Take 1 tablet (500 mg total) by mouth 2 (two) times daily with a meal. 180 tablet 4   methocarbamol (ROBAXIN) 500 MG tablet Take 1 tablet (500 mg total) by mouth every 8 (eight) hours as needed for muscle spasms. 180 tablet 1   metoprolol tartrate (LOPRESSOR) 25 MG tablet TAKE 1 TABLET BY MOUTH TWICE DAILY FOR FAST HEART RATE>100 180 tablet 0   midodrine (PROAMATINE) 10 MG tablet Take 1 tablet (10 mg total) by mouth 3 (three) times daily as needed. 90 tablet 1   nitroGLYCERIN (NITROLINGUAL) 0.4 MG/SPRAY spray USE 1 SPRAY AS DIRECTED EVERY 5 MINUTES AS NEEDED 4.9 g 1   omeprazole (PRILOSEC) 40 MG capsule Take 1 capsule (40 mg total) by mouth daily. 90 capsule 4   pravastatin (PRAVACHOL) 20 MG tablet TAKE 1 TABLET(20 MG) BY MOUTH DAILY 90 tablet 4   prednisoLONE acetate (PRED FORTE) 1 % ophthalmic suspension SMARTSIG:In Eye(s)     spironolactone (ALDACTONE) 25 MG tablet Take 0.5 tablets (12.5 mg  total) by mouth daily. 45 tablet 4   warfarin (COUMADIN) 2.5 MG tablet TAKE 1 AND 1/2 TABLETS BY MOUTH EVERY DAY OR AS DIRECTED AFTER ANTI-COAG CLINIC 150 tablet 0   gatifloxacin (ZYMAXID) 0.5 % SOLN SMARTSIG:In Eye(s)     predniSONE (DELTASONE) 20 MG tablet Take two tablets daily for 3 days followed by one tablet daily for 4 days 10 tablet 0   No facility-administered medications prior to visit.     Per HPI unless specifically indicated in ROS section below Review of Systems  Objective:  BP 126/78   Temp 97.8 F (36.6 C) (Oral)   Ht 5\' 11"  (1.803 m)   Wt 222 lb (100.7 kg)   BMI 30.96 kg/m   Wt Readings from Last 3 Encounters:  11/18/23 222 lb (100.7 kg)  10/19/23 220 lb 4 oz (99.9 kg)  09/22/23 219 lb 2 oz (99.4 kg)      Physical Exam  Vitals and nursing note reviewed.  Constitutional:      Appearance: Normal appearance. He is not ill-appearing.     Comments: Sitting in wheelchair  HENT:     Head: Normocephalic and atraumatic.  Abdominal:     General: There is no distension.     Palpations: Abdomen is soft. There is no mass.     Tenderness: There is no abdominal tenderness. There is no right CVA tenderness, left CVA tenderness, guarding or rebound.     Hernia: No hernia is present.  Musculoskeletal:        General: Tenderness present. No deformity or signs of injury.     Right lower leg: No edema.     Left lower leg: No edema.     Comments:  2+ DP bilaterally R hand/forearm is colder than left  FROM at R wrist Reproducible tenderness to palpation of lateral forearm from elbow to wrist   Skin:    General: Skin is warm and dry.     Findings: No rash.     Comments: No obvious skin changes   Neurological:     Mental Status: He is alert.     Comments:  No numbness present to R arm/hand Grip strength intact Neg tinel on right  Psychiatric:        Mood and Affect: Mood normal.        Behavior: Behavior normal.       Results for orders placed or performed in  visit on 11/18/23  POCT Urinalysis Dipstick (Automated)   Collection Time: 11/18/23  2:42 PM  Result Value Ref Range   Color, UA dark yellow    Clarity, UA cloudy    Glucose, UA Negative Negative   Bilirubin, UA 1+    Ketones, UA negative    Spec Grav, UA 1.020 1.010 - 1.025   Blood, UA +/-    pH, UA 6.0 5.0 - 8.0   Protein, UA Positive (A) Negative   Urobilinogen, UA 0.2 0.2 or 1.0 E.U./dL   Nitrite, UA negative    Leukocytes, UA Small (1+) (A) Negative  Urine Culture   Collection Time: 11/18/23  2:52 PM   Specimen: Urine  Result Value Ref Range   MICRO NUMBER: [id number]    SPECIMEN QUALITY: Adequate    Sample Source URINE    STATUS: FINAL    Result:      Less than 10,000 CFU/mL of single Gram positive organism isolated. No further testing will be performed. If clinically indicated, recollection using a method to minimize contamination, with prompt transfer to Urine Culture Transport Tube, is recommended.   *Note: Due to a large number of results and/or encounters for the requested time period, some results have not been displayed. A complete set of results can be found in Results Review.  Micro:  WBC 20+  RBC 5-10  Bact 1+  Casts none  Epi rare  UCx sent   Lab Results  Component Value Date   HGBA1C 7.8 (H) 09/15/2023    Lab Results  Component Value Date   NA 140 09/15/2023   CL 101 09/15/2023   K 4.6 09/15/2023   CO2 31 09/15/2023   BUN 14 09/15/2023   CREATININE 0.99 09/15/2023   GFR 72.52 09/15/2023   CALCIUM 8.6 09/15/2023   PHOS 3.7 11/03/2018   ALBUMIN 3.5 09/15/2023   GLUCOSE 119 (H) 09/15/2023    Lab Results  Component Value Date   WBC 6.9 09/15/2023   HGB 13.1 09/15/2023  HCT 41.5 09/15/2023   MCV 95.4 09/15/2023   PLT 224.0 09/15/2023    Lab Results  Component Value Date   IRON 70 09/15/2023   TIBC 382.2 09/15/2023   FERRITIN 24.7 09/15/2023    Lab Results  Component Value Date   ESRSEDRATE 27 (H) 11/11/2022    Lab Results  Component  Value Date   CKTOTAL 64 09/15/2022   Assessment & Plan:   Problem List Items Addressed This Visit     Encounter for chronic pain management (Chronic)   [place] CSRS reviewed.       Type 2 diabetes mellitus with diabetic neuropathy (HCC)   Latest A1c was 7.8%.  This is only on metformin XR 500mg  BID as he's not been taking Jardiance due to cost.  Previously intolerance to glipizide (diarrhea).  Will need treatment intensification discussion next visit.       Right forearm pain   Chronic R forearm pain without inciting trauma, present for over a year, not consistent with CTS. ?chronic tendinopathy/muscle tear vs developing complex regional pain in h/o PVD, r/o cervical radiculopathy (denies neck symptoms).  R forearm xray was unrevealing besides DJD (09/2022).  Notes benefit with hydrocodone and muscle relaxant use and compression sleeve. Also felt better while on prednisone course or back.  Offered hand specialist referral vs advanced forearm/wrist or rpt cervical imaging with MRI - he opts for hand surgeon referral.       Relevant Orders   Ambulatory referral to Hand Surgery   Dysuria - Primary   Has symptoms of UTI with abnormal urinalysis/micro - will Rx keflex 1 wk course and send UCx for further evaluation.  Update if ongoing symptoms after treatment.  Not currently on Jardiance      Relevant Orders   POCT Urinalysis Dipstick (Automated) (Completed)   Urine Culture (Completed)     Meds ordered this encounter  Medications   cephALEXin (KEFLEX) 500 MG capsule    Sig: Take 1 capsule (500 mg total) by mouth 2 (two) times daily.    Dispense:  14 capsule    Refill:  0    Orders Placed This Encounter  Procedures   Urine Culture   Ambulatory referral to Hand Surgery    Referral Priority:   Routine    Referral Type:   Surgical    Referral Reason:   Specialty Services Required    Requested Specialty:   Hand Surgery    Number of Visits Requested:   1   POCT Urinalysis  Dipstick (Automated)    Patient Instructions  I will refer you to hand doctor for evaluation of chronic right arm/wrist pain Urine today showing possible infection - I will send culture and start you on antibiotic keflex 500mg  twice daily for 1 week.  Let us know if not improved with treatment.   Follow up plan: Return if symptoms worsen or fail to improve.  Eustaquio Boyden, MD

## 2023-11-18 NOTE — Assessment & Plan Note (Signed)
[place] CSRS reviewed  ?

## 2023-11-18 NOTE — Patient Instructions (Addendum)
I will refer you to hand doctor for evaluation of chronic right arm/wrist pain Urine today showing possible infection - I will send culture and start you on antibiotic keflex 500mg  twice daily for 1 week.  Let us know if not improved with treatment.

## 2023-11-19 LAB — URINE CULTURE
MICRO NUMBER:: 15837709
SPECIMEN QUALITY:: ADEQUATE

## 2023-11-21 ENCOUNTER — Encounter: Payer: Self-pay | Admitting: Family Medicine

## 2023-11-21 NOTE — Assessment & Plan Note (Addendum)
Chronic R forearm pain without inciting trauma, present for over a year, not consistent with CTS. ?chronic tendinopathy/muscle tear vs developing complex regional pain in h/o PVD, r/o cervical radiculopathy (denies neck symptoms).  R forearm xray was unrevealing besides DJD (09/2022).  Notes benefit with hydrocodone and muscle relaxant use and compression sleeve. Also felt better while on prednisone course or back.  Offered hand specialist referral vs advanced forearm/wrist or rpt cervical imaging with MRI - he opts for hand surgeon referral.

## 2023-11-21 NOTE — Assessment & Plan Note (Addendum)
Has symptoms of UTI with abnormal urinalysis/micro - will Rx keflex 1 wk course and send UCx for further evaluation.  Update if ongoing symptoms after treatment.  Not currently on Jardiance

## 2023-11-21 NOTE — Assessment & Plan Note (Addendum)
Latest A1c was 7.8%.  This is only on metformin XR 500mg  BID as he's not been taking Jardiance due to cost.  Previously intolerance to glipizide (diarrhea).  Will need treatment intensification discussion next visit.

## 2023-11-23 ENCOUNTER — Encounter: Payer: Self-pay | Admitting: *Deleted

## 2023-12-01 ENCOUNTER — Ambulatory Visit: Payer: Medicare HMO | Attending: Cardiovascular Disease

## 2023-12-01 DIAGNOSIS — I4891 Unspecified atrial fibrillation: Secondary | ICD-10-CM | POA: Diagnosis not present

## 2023-12-01 DIAGNOSIS — Z5181 Encounter for therapeutic drug level monitoring: Secondary | ICD-10-CM | POA: Diagnosis not present

## 2023-12-01 LAB — POCT INR: INR: 4 — AB (ref 2.0–3.0)

## 2023-12-01 NOTE — Patient Instructions (Signed)
Hold today only then  Continue on same dosage of Warfarin 1.5 tablets daily. Recheck INR in 3 weeks

## 2023-12-04 ENCOUNTER — Other Ambulatory Visit: Payer: Self-pay | Admitting: Family Medicine

## 2023-12-04 DIAGNOSIS — G894 Chronic pain syndrome: Secondary | ICD-10-CM

## 2023-12-07 NOTE — Telephone Encounter (Signed)
Name of Medication:  Hydrocodone-APAP Name of Pharmacy:  [facility] Last Fill or Written Date and Quantity:  10/21/23, #120 Last Office Visit and Type:  11/18/23, dysuria Next Office Visit and Type:  none Last Controlled Substance Agreement Date:  02/03/18 Last UDS:  07/06/17

## 2023-12-09 ENCOUNTER — Encounter: Payer: Self-pay | Admitting: Pharmacist

## 2023-12-09 NOTE — Progress Notes (Unsigned)
 Manufacturer Assistance Program (MAP) Application   Manufacturer: [employer] ([employer])    (New enrollment) Medication(s): Farxiga   Patient Portion of Application:  10/16: Filled out and placed in front office for patient signature.  10/18: Spoke with pt notifying Dr KANDICE has signed form and now we need pt to come by and sign form. Pt verbalizes understanding and will stop by on 09/28/23. 10/23: Patient has still not signed form. MyChart message sent.  11/13: Form not signed. MyChart message sent.  Income Documentation:  N/A electronic verification. Patient aware he must provide household size and estimated annual income  Provider Portion of Application:  10/16: Provider portion placed in PCP inbox for signature. 10/18: Signed by provider Prescription(s): Included in MAP application.   Application Status: Not submitted (pending signatures)  Next Steps: [x]    PCP signature []    Upon signature(s) Application to be faxed to [employer] Fax: [phone] AND scanned into patient chart []    Fax confirmation of approval received []    Patient signature  *[facility] clinic team - Please Addend/update this note as the Next Steps are completed in office*

## 2023-12-10 ENCOUNTER — Other Ambulatory Visit: Payer: Self-pay

## 2023-12-10 DIAGNOSIS — M7711 Lateral epicondylitis, right elbow: Secondary | ICD-10-CM | POA: Diagnosis not present

## 2023-12-10 DIAGNOSIS — E11621 Type 2 diabetes mellitus with foot ulcer: Secondary | ICD-10-CM | POA: Diagnosis not present

## 2023-12-10 DIAGNOSIS — L97522 Non-pressure chronic ulcer of other part of left foot with fat layer exposed: Secondary | ICD-10-CM | POA: Diagnosis not present

## 2023-12-10 DIAGNOSIS — M79601 Pain in right arm: Secondary | ICD-10-CM | POA: Diagnosis not present

## 2023-12-10 MED ORDER — HYDROCODONE-ACETAMINOPHEN 10-325 MG PO TABS: 1.0000 | ORAL_TABLET | Freq: Four times a day (QID) | ORAL | 0 refills | Status: DC | PRN

## 2023-12-10 NOTE — Progress Notes (Signed)
Review of Systems   Musculoskeletal: Positive for arthralgias.

## 2023-12-10 NOTE — Telephone Encounter (Signed)
 Copied from CRM [id number]. Topic: Clinical - Prescription Issue >> Dec 10, 2023  8:29 AM Joanell NOVAK wrote: Reason for CRM: Pt wife Nathanel would like to follow up on the authorization for pt medication HYDROcodone -acetaminophen  (NORCO) 10-325 MG tablet. She stated that the request was sent in on 12/04/23.

## 2023-12-10 NOTE — Telephone Encounter (Signed)
 ERx

## 2023-12-15 NOTE — Progress Notes (Signed)
 Chief Complaint: Chief Complaint  Patient presents with  . Right Arm - Pain    Reason for Visit: [name] is a 78 y.o. male who presents today for evaluation of his right forearm discomfort.  Patient states for the last 6 months he has had pain that starts at his right elbow and extends down into his right hand.  He denies having any falls or trauma to the area.  He denies having any history of right elbow or wrist issues.  He states that it can sometimes be a sharp pain and it will bother him constantly.  He denies any numbness, tingling or burning sensations.  He states that he has tried various different arthritis creams, Salonpas patches, and other daily medications he takes including hydrocodone , methocarbamol  and gabapentin .  He states that the pain localizes to the top lateral aspect of his elbow and seems to run down the back part of his arm and into the top part of his wrist.  Patient is noted to be a diabetic, his last A1c was 7.8.  He also does utilize daily warfarin.   Medications: Current Outpatient Medications  Medication Sig Dispense Refill  . albuterol  90 mcg/actuation inhaler Inhale 2 inhalations into the lungs every 6 (six) hours as needed    . AMIOdarone  (PACERONE ) 200 MG tablet Take 200 mg by mouth once daily    . aspirin  81 MG EC tablet Take 81 mg by mouth once daily    . cholecalciferol  (VITAMIN D3) 1,250 mcg (50,000 unit) capsule Take 50,000 Units by mouth once a week    . coenzyme Q10 10 mg capsule Take 1 tablet by mouth once daily    . cyanocobalamin  (VITAMIN B12) 500 MCG tablet Take 500 mcg by mouth once daily    . docosahexaenoic acid-epa 120-180 mg Cap Take 1 capsule by mouth once daily    . FUROsemide  (LASIX ) 20 MG tablet Take 2 tablets (40 mg total) by mouth once daily for 20 days 40 tablet 0  . gabapentin  (NEURONTIN ) 600 MG tablet Take 600 mg by mouth 2 (two) times daily    . metFORMIN  (GLUMETZA ) 500 MG (MOD) ER tablet Take 500 mg by mouth daily with dinner     . midodrine  (PROAMATINE ) 10 MG tablet Take 10 mg by mouth 3 (three) times daily as needed (blood pressure)    . omeprazole  (PRILOSEC) 40 MG DR capsule Take 40 mg by mouth once daily    . pravastatin  (PRAVACHOL ) 20 MG tablet Take 20 mg by mouth once daily    . warfarin (COUMADIN ) 2.5 MG tablet TAKE BY MOUTH AS DIRECTED AFTER ANTI-COAG CLINIC    . spironolactone  (ALDACTONE ) 25 MG tablet Take 0.5 tablets (12.5 mg total) by mouth once daily 15 tablet 11   No current facility-administered medications for this visit.    Allergies: Allergies  Allergen Reactions  . Oxycodone  Hcl Shortness Of Breath  . Bupropion  Hcl Other (See Comments)    Hallucinations  . Glipizide  Diarrhea  . Metformin  Diarrhea    Trouble tolerating even extended release metformin   . Simvastatin  Other (See Comments)    Muscle pain  . Sitagliptin  Diarrhea  . Varenicline  Tartrate Other (See Comments)    REACTION: hallucinations, but on retrial did well    Past Medical History: Past Medical History:  Diagnosis Date  . Aneurysm (CMS-HCC)   . COPD (chronic obstructive pulmonary disease) (CMS/HHS-HCC)   . Diabetes mellitus without complication (CMS/HHS-HCC)   . GERD (gastroesophageal reflux disease)   .  History of cancer     Past Surgical History: Past Surgical History:  Procedure Laterality Date  . endovascular aortic aneurysm repair  2012  . REPAIR RECURRENT INGUINAL HERNIA INCARCERATED/STRANGULATED Right 11/04/2021   Procedure: REPAIR RECURRENT INGUINAL HERNIA, ANY AGE; INCARCERATED OR STRANGULATED;  Surgeon: Nena Marolyn Lenis, MD;  Location: [facility] OR;  Service: General Surgery;  Laterality: Right;  . COLECTOMY PARTIAL W/ANASTAMOSIS Right 11/04/2021   Procedure: COLECTOMY, PARTIAL; WITH ANASTOMOSIS (ileocecectomy);  Surgeon: Nena Marolyn Lenis, MD;  Location: [facility] OR;  Service: General Surgery;  Laterality: Right;  . CHOLECYSTECTOMY      Social History: Social History   Socioeconomic History  . Marital  status: Married  Tobacco Use  . Smoking status: Never  . Smokeless tobacco: Never  Substance and Sexual Activity  . Alcohol use: No  . Drug use: Defer  . Sexual activity: Defer   Social Drivers of Health   Financial Resource Strain: Low Risk  (01/01/2022)   Overall Financial Resource Strain (CARDIA)   . Difficulty of Paying Living Expenses: Not very hard  Food Insecurity: No Food Insecurity (01/01/2022)   Hunger Vital Sign   . Worried About Programme Researcher, Broadcasting/film/video in the Last Year: Never true   . Ran Out of Food in the Last Year: Never true  Transportation Needs: No Transportation Needs (01/01/2022)   PRAPARE - Transportation   . Lack of Transportation (Medical): No   . Lack of Transportation (Non-Medical): No    Job: Retired  Family History: Family History  Problem Relation Name Age of Onset  . Coronary Artery Disease (Blocked arteries around heart) Father      Review of Systems: A comprehensive 14 point ROS was performed, reviewed, and the pertinent orthopaedic findings are documented in the HPI.  Physical Exam Vitals:   12/10/23 1524 12/10/23 1525  BP: 138/80   Weight: (!) 104.8 kg (231 lb)   Height: 182.9 cm (6')   PainSc:   8   8  PainLoc: Arm Arm   General/Constitutional: The patient appears to be well-nourished, well-developed, and in no acute distress. Neuro/Psych: Normal mood and affect, oriented to person, place and time. Eyes: Non-icteric.  Pupils are equal, round, and reactive to light, and exhibit synchronous movement. Lymphatic: No palpable adenopathy. Respiratory: Normal chest excursion and Non-labored breathing Cardiovascular: No edema, swelling or tenderness, except as noted in detailed exam. Vascular: No edema, swelling or tenderness, except as noted in detailed exam. Integumentary: No impressive skin lesions present, except as noted in detailed exam. Musculoskeletal: See right elbow/forearm exam below BP 138/80   Ht 182.9 cm (6')   Wt (!) 104.8 kg  (231 lb)   BMI 31.33 kg/m   Right elbow exam:  Upon inspection of the patient's right elbow and forearm, there is not appear to be any noticeable skin changes or deformities present.  There is not appear to be any noticeable localized swelling.  With palpation, patient reports a great amount of discomfort up along his lateral epicondyle.  He does report having some discomfort that extends down the brachial radialis.  Does also report having some pain along the distal radiocarpal junction and into his hand.  Patient has increased difficulty with symptoms with deep passive wrist flexion.  Patient has findings consistent with a lateral epicondylitis as well as potential brachial radialis muscle strain  Imaging: A series of x-rays were ordered and interpreted of the patient's right humerus.  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  There does not appear to be any noticeable changes along the distalmost lateral or medial epicondyles without any bony abnormality noted.  Humeral head appears to be higher riding.  No fractures, lytic lesions or gross fomites appreciated on films.  Impression: Lateral epicondylitis Potential brachioradialis strain  Plan:   The findings were discussed in detail with the patient. Series of x-rays were ordered and interpreted of the patient's right humerus and arm, the findings were discussed with the patient. Given patient's history of warfarin use, unable to tolerate any oral NSAIDs at this time. Patient may utilize Voltaren  cream topically Urged patient to utilize topical ice Activity modification discussed Discussed potential for future corticosteroid injections to help with this underlying issue, did not wish to have any injections performed today Patient will plan to follow-up as needed   SPECIAL EQUIPMENT: None SPECIAL STUDIES: None ACTIVITIES:  As tolerated. WORK STATUS: None THERAPY: Stretches and exercises were  provided to help with the above listed issue MEDICATIONS: Requested Prescriptions    No prescriptions requested or ordered in this encounter   FOLLOW-UP: No follow-ups on file.  I personally spent a total of 35 minutes reviewing the patient's chart, interpreting labs/imaging studies, interaction with, and development of a treatment plan during this patient's visit.  Fonda Dallas Koyanagi, [place]  Fonda CHARLENA Koyanagi, PA-C [facility] 12/10/2023 There are other unrelated non-urgent complaints, but due to the busy schedule and the amount of time I've already spent with him, time does not permit me to address these routine issues at today's visit. I've requested another appointment to review these additional issues.

## 2023-12-18 ENCOUNTER — Encounter: Payer: Self-pay | Admitting: Family Medicine

## 2023-12-18 ENCOUNTER — Ambulatory Visit (INDEPENDENT_AMBULATORY_CARE_PROVIDER_SITE_OTHER): Payer: Medicare HMO | Admitting: Family Medicine

## 2023-12-18 VITALS — BP 128/78 | HR 62 | Temp 98.6°F | Ht 71.0 in

## 2023-12-18 DIAGNOSIS — I480 Paroxysmal atrial fibrillation: Secondary | ICD-10-CM | POA: Diagnosis not present

## 2023-12-18 DIAGNOSIS — N3941 Urge incontinence: Secondary | ICD-10-CM

## 2023-12-18 DIAGNOSIS — Z7984 Long term (current) use of oral hypoglycemic drugs: Secondary | ICD-10-CM

## 2023-12-18 DIAGNOSIS — C61 Malignant neoplasm of prostate: Secondary | ICD-10-CM | POA: Diagnosis not present

## 2023-12-18 DIAGNOSIS — J449 Chronic obstructive pulmonary disease, unspecified: Secondary | ICD-10-CM

## 2023-12-18 DIAGNOSIS — I5032 Chronic diastolic (congestive) heart failure: Secondary | ICD-10-CM

## 2023-12-18 DIAGNOSIS — E1140 Type 2 diabetes mellitus with diabetic neuropathy, unspecified: Secondary | ICD-10-CM | POA: Diagnosis not present

## 2023-12-18 DIAGNOSIS — R351 Nocturia: Secondary | ICD-10-CM

## 2023-12-18 DIAGNOSIS — R3915 Urgency of urination: Secondary | ICD-10-CM

## 2023-12-18 LAB — CBC WITH DIFFERENTIAL/PLATELET
Basophils Absolute: 0 10*3/uL (ref 0.0–0.1)
Basophils Relative: 0.4 % (ref 0.0–3.0)
Eosinophils Absolute: 0.2 10*3/uL (ref 0.0–0.7)
Eosinophils Relative: 2.3 % (ref 0.0–5.0)
HCT: 37.5 % — ABNORMAL LOW (ref 39.0–52.0)
Hemoglobin: 12.1 g/dL — ABNORMAL LOW (ref 13.0–17.0)
Lymphocytes Relative: 20.1 % (ref 12.0–46.0)
Lymphs Abs: 1.6 10*3/uL (ref 0.7–4.0)
MCHC: 32.4 g/dL (ref 30.0–36.0)
MCV: 95.8 fL (ref 78.0–100.0)
Monocytes Absolute: 0.5 10*3/uL (ref 0.1–1.0)
Monocytes Relative: 5.9 % (ref 3.0–12.0)
Neutro Abs: 5.6 10*3/uL (ref 1.4–7.7)
Neutrophils Relative %: 71.3 % (ref 43.0–77.0)
Platelets: 259 10*3/uL (ref 150.0–400.0)
RBC: 3.92 Mil/uL — ABNORMAL LOW (ref 4.22–5.81)
RDW: 15.1 % (ref 11.5–15.5)
WBC: 7.8 10*3/uL (ref 4.0–10.5)

## 2023-12-18 LAB — PSA: PSA: 0.36 ng/mL (ref 0.10–4.00)

## 2023-12-18 LAB — BASIC METABOLIC PANEL
BUN: 15 mg/dL (ref 6–23)
CO2: 27 meq/L (ref 19–32)
Calcium: 8.7 mg/dL (ref 8.4–10.5)
Chloride: 102 meq/L (ref 96–112)
Creatinine, Ser: 1.21 mg/dL (ref 0.40–1.50)
GFR: 56.9 mL/min — ABNORMAL LOW (ref >60.00)
Glucose, Bld: 236 mg/dL — ABNORMAL HIGH (ref 70–99)
Potassium: 4.4 meq/L (ref 3.5–5.1)
Sodium: 140 meq/L (ref 135–145)

## 2023-12-18 MED ORDER — FUROSEMIDE 20 MG PO TABS: ORAL_TABLET | ORAL | 0 refills | Status: DC

## 2023-12-18 MED ORDER — MIRABEGRON ER 25 MG PO TB24: 25.0000 mg | ORAL_TABLET | Freq: Every day | ORAL | 1 refills | Status: DC

## 2023-12-18 NOTE — Progress Notes (Signed)
 Ph: [phone] Fax: [phone]   Patient ID: [name], male    DOB: 02/02/1946, 78 y.o.   MRN: [id number]  This visit was conducted in person.  BP 128/78   Pulse 62   Temp 98.6 F (37 C) (Tympanic)   Ht 5' 11 (1.803 m)   SpO2 (!) 88% Comment: RA  BMI 30.96 kg/m    CC: urinary symptoms  Subjective:   HPI: [name] is a 78 y.o. male presenting on 12/18/2023 for Urinary Urgency (C/o low urine and urinary urgency. Pt accompanied by wife, [name]. )   Ongoing urinary symptoms - predominantly urge incontinence.  No dysuria but he has pain at end of stream.  Ongoing for 3 months.  Last visit treated with 7d keflex  course with mild improvement while on antibiotic however urine culture returned with insignificant growth.   Nocturia x5 - urinates into bedside bottle at night.  No fevers/chills, dysuria, hematuria, lower abd pain, flank pain or nausea.  Drinks caffeine 2 cups in am, 1 dark soda/day.   Notes R>L leg is starting to swell, worse at night.  Declines compression stockings.  He has been using healthy socks.   Out of lasix  - requests refill. Not taking Jardiance  - will stay off of this given current urinary symptoms.  Lab Results  Component Value Date   HGBA1C 7.8 (H) 09/15/2023  Recent non-fasting sugars 150s.  Continues metformin  500mg    Requests [place] urology referral if needed.   Has home oxygen  - uses PRN.      Relevant past medical, surgical, family and social history reviewed and updated as indicated. Interim medical history since our last visit reviewed. Allergies and medications reviewed and updated. Outpatient Medications Prior to Visit  Medication Sig Dispense Refill   albuterol  (VENTOLIN  HFA) 108 (90 Base) MCG/ACT inhaler INHALE 2 PUFFS INTO THE LUNGS EVERY 6 HOURS AS NEEDED FOR WHEEZING OR SHORTNESS OF BREATH 18 g 3   amiodarone  (PACERONE ) 200 MG tablet Take 1 tablet (200 mg total) by mouth as needed. Take for fast heart  rate>100 60 tablet 3   aspirin  81 MG EC tablet Take 1 tablet (81 mg total) by mouth daily. Swallow whole.     Budeson-Glycopyrrol-Formoterol (BREZTRI  AEROSPHERE) 160-9-4.8 MCG/ACT AERO Inhale 2 puffs into the lungs in the morning and at bedtime. 3 each 3   Cholecalciferol  (VITAMIN D ) 50 MCG (2000 UT) CAPS Take 1 capsule (2,000 Units total) by mouth daily. 30 capsule    Coenzyme Q10 (COQ-10 PO) Take 1 tablet by mouth daily.     Cyanocobalamin  (B-12) 1000 MCG SUBL Place 1 tablet under the tongue every Monday, Wednesday, and Friday. 30 tablet    diclofenac  Sodium (VOLTAREN ) 1 % GEL Apply 4 g topically 3 (three) times daily. As needed 100 g 3   diltiazem  (CARDIZEM ) 30 MG tablet Take 1 tablet by mouth daily as needed for fast heart rate 30 tablet 5   diphenoxylate -atropine  (LOMOTIL ) 2.5-0.025 MG tablet Take 1 tablet by mouth 2 (two) times daily as needed. 20 tablet 0   Ferrous Sulfate  (IRON) 325 (65 Fe) MG TABS Take by mouth daily.     fish oil-omega-3 fatty acids 1000 MG capsule Take 2 g by mouth daily.     gabapentin  (NEURONTIN ) 600 MG tablet Take 2 tablets (1,200 mg total) by mouth 2 (two) times daily. 360 tablet 1   HYDROcodone -acetaminophen  (NORCO) 10-325 MG tablet Take 1 tablet by mouth every 6 (six) hours as needed for moderate  pain (pain score 4-6) or severe pain (pain score 7-10). 120 tablet 0   ipratropium-albuterol  (DUONEB) 0.5-2.5 (3) MG/3ML SOLN Take 3 mLs by nebulization every 6 (six) hours as needed. 360 mL 6   loratadine  (CLARITIN ) 10 MG tablet Take 1 tablet (10 mg total) by mouth daily.     metFORMIN  (GLUCOPHAGE -XR) 500 MG 24 hr tablet Take 1 tablet (500 mg total) by mouth 2 (two) times daily with a meal. 180 tablet 4   methocarbamol  (ROBAXIN ) 500 MG tablet Take 1 tablet (500 mg total) by mouth every 8 (eight) hours as needed for muscle spasms. 180 tablet 1   metoprolol  tartrate (LOPRESSOR ) 25 MG tablet TAKE 1 TABLET BY MOUTH TWICE DAILY FOR FAST HEART RATE>100 180 tablet 0   midodrine   (PROAMATINE ) 10 MG tablet Take 1 tablet (10 mg total) by mouth 3 (three) times daily as needed. 90 tablet 1   nitroGLYCERIN  (NITROLINGUAL ) 0.4 MG/SPRAY spray USE 1 SPRAY AS DIRECTED EVERY 5 MINUTES AS NEEDED 4.9 g 1   omeprazole  (PRILOSEC) 40 MG capsule Take 1 capsule (40 mg total) by mouth daily. 90 capsule 4   pravastatin  (PRAVACHOL ) 20 MG tablet TAKE 1 TABLET(20 MG) BY MOUTH DAILY 90 tablet 4   spironolactone  (ALDACTONE ) 25 MG tablet Take 0.5 tablets (12.5 mg total) by mouth daily. 45 tablet 4   warfarin (COUMADIN ) 2.5 MG tablet TAKE 1 AND 1/2 TABLETS BY MOUTH EVERY DAY OR AS DIRECTED AFTER ANTI-COAG CLINIC 150 tablet 0   cephALEXin  (KEFLEX ) 500 MG capsule Take 1 capsule (500 mg total) by mouth 2 (two) times daily. 14 capsule 0   furosemide  (LASIX ) 20 MG tablet TAKE 1 TABLET BY MOUTH EVERY DAY AS NEEDED FOR FLUID RETENTION 90 tablet 0   empagliflozin  (JARDIANCE ) 10 MG TABS tablet Take 1 tablet (10 mg total) by mouth daily before breakfast. (Patient not taking: Reported on 12/18/2023) 90 tablet 1   prednisoLONE acetate (PRED FORTE) 1 % ophthalmic suspension SMARTSIG:In Eye(s)     No facility-administered medications prior to visit.     Per HPI unless specifically indicated in ROS section below Review of Systems  Objective:  BP 128/78   Pulse 62   Temp 98.6 F (37 C) (Tympanic)   Ht 5' 11 (1.803 m)   SpO2 (!) 88% Comment: RA  BMI 30.96 kg/m   Wt Readings from Last 3 Encounters:  11/18/23 222 lb (100.7 kg)  10/19/23 220 lb 4 oz (99.9 kg)  09/22/23 219 lb 2 oz (99.4 kg)      Physical Exam Vitals and nursing note reviewed.  Constitutional:      Appearance: Normal appearance. He is not ill-appearing.     Comments: Sitting in wheelchair  HENT:     Head: Normocephalic and atraumatic.     Mouth/Throat:     Mouth: Mucous membranes are moist.     Pharynx: Oropharynx is clear. No oropharyngeal exudate or posterior oropharyngeal erythema.  Cardiovascular:     Rate and Rhythm: Normal  rate.     Pulses: Normal pulses.     Heart sounds: No murmur heard.    Comments: Distant heart sounds Pulmonary:     Effort: Pulmonary effort is normal. No respiratory distress.     Breath sounds: No wheezing, rhonchi or rales.     Comments: Coarse breath sounds throughout Musculoskeletal:     Right lower leg: Edema (1+) present.     Left lower leg: Edema (tr) present.  Skin:    General: Skin is warm  and dry.     Findings: No rash.  Neurological:     Mental Status: He is alert.  Psychiatric:        Mood and Affect: Mood normal.        Behavior: Behavior normal.       Results for orders placed or performed in visit on 12/18/23  CBC with Differential/Platelet   Collection Time: 12/18/23  1:11 PM  Result Value Ref Range   WBC 7.8 4.0 - 10.5 K/uL   RBC 3.92 (L) 4.22 - 5.81 Mil/uL   Hemoglobin 12.1 (L) 13.0 - 17.0 g/dL   HCT 62.4 (L) 60.9 - 47.9 %   MCV 95.8 78.0 - 100.0 fl   MCHC 32.4 30.0 - 36.0 g/dL   RDW 84.8 88.4 - 84.4 %   Platelets 259.0 150.0 - 400.0 K/uL   Neutrophils Relative % 71.3 43.0 - 77.0 %   Lymphocytes Relative 20.1 12.0 - 46.0 %   Monocytes Relative 5.9 3.0 - 12.0 %   Eosinophils Relative 2.3 0.0 - 5.0 %   Basophils Relative 0.4 0.0 - 3.0 %   Neutro Abs 5.6 1.4 - 7.7 K/uL   Lymphs Abs 1.6 0.7 - 4.0 K/uL   Monocytes Absolute 0.5 0.1 - 1.0 K/uL   Eosinophils Absolute 0.2 0.0 - 0.7 K/uL   Basophils Absolute 0.0 0.0 - 0.1 K/uL  Basic metabolic panel   Collection Time: 12/18/23  1:11 PM  Result Value Ref Range   Sodium 140 135 - 145 mEq/L   Potassium 4.4 3.5 - 5.1 mEq/L   Chloride 102 96 - 112 mEq/L   CO2 27 19 - 32 mEq/L   Glucose, Bld 236 (H) 70 - 99 mg/dL   BUN 15 6 - 23 mg/dL   Creatinine, Ser 8.78 0.40 - 1.50 mg/dL   GFR 43.09 (L) >39.99 mL/min   Calcium  8.7 8.4 - 10.5 mg/dL  PSA   Collection Time: 12/18/23  1:11 PM  Result Value Ref Range   PSA 0.36 0.10 - 4.00 ng/mL   *Note: Due to a large number of results and/or encounters for the  requested time period, some results have not been displayed. A complete set of results can be found in Results Review.   Lab Results  Component Value Date   PSA 0.36 12/18/2023   PSA 0.08 (L) 09/15/2022   PSA 0.11 09/10/2021    Assessment & Plan:   Problem List Items Addressed This Visit     Atrial fibrillation (HCC)   Continues PRN metoprolol , diltiazem , amiodarone        Relevant Medications   furosemide  (LASIX ) 20 MG tablet   Other Relevant Orders   CBC with Differential/Platelet (Completed)   COPD mixed type (HCC)   Chronic COPD with hypoxia, has home oxygen  to use as needed. Denies dyspnea, wheeze, new cough or fever.       Type 2 diabetes mellitus with diabetic neuropathy (HCC)   Suspect component of neurogenic bladder from diabetes.  Stay off Jardiance  at this time. Glipizide  intolerance.  Continue metformin  XR 500mg  BID. Consider GLP1RA for cardiovascular risk reduction, he's previously been hesitant for injectable medication.       Relevant Orders   CBC with Differential/Platelet (Completed)   Basic metabolic panel (Completed)   Chronic heart failure with preserved ejection fraction (HFpEF) (HCC)   With noted progressive R>L pedal edema and borderline low oxygen , ?CHF contribution - will refill lasix  with indications when to use.       Relevant  Medications   furosemide  (LASIX ) 20 MG tablet   Prostate cancer [facility])   Saw urology and rad onc s/p radiation therapy, continues regular rad onc f/u with Dr Lenn but has not seen urology recently. Will update PSA.       Relevant Orders   PSA (Completed)   Nocturia   Relevant Orders   Ambulatory referral to Urology   Urge urinary incontinence - Primary   Longstanding history of urinary symptoms including nocturia, urgency, frequency, and what sound like bladder spasms at end of stream, predominant concern is worsening urge incontinence and overactive bladder symtpoms, acutely worse over the last few months. UCx  last month with insignificant growth. Will update UA - was unable to provide sample, will bring one back.  He is not on SGLT2i - will not resume at this time given above symptoms.  Will price out Myrbetriq . Consider antimuscarinics.  I-PSS = 17-6.  In longstanding diabetes, suspect component of neurogenic bladder.  Will refer to urology for further evaluation. He previously saw [facility] for h/o prostate cancer Jacqu[facility]ne) - desires to establish locally in [place].       Relevant Medications   mirabegron  ER (MYRBETRIQ ) 25 MG TB24 tablet   Other Relevant Orders   CBC with Differential/Platelet (Completed)   Basic metabolic panel (Completed)   Ambulatory referral to Urology   POCT Urinalysis Dipstick (Automated)     Meds ordered this encounter  Medications   furosemide  (LASIX ) 20 MG tablet    Sig: TAKE 1 TABLET BY MOUTH EVERY DAY AS NEEDED FOR FLUID RETENTION    Dispense:  90 tablet    Refill:  0   mirabegron  ER (MYRBETRIQ ) 25 MG TB24 tablet    Sig: Take 1 tablet (25 mg total) by mouth daily.    Dispense:  30 tablet    Refill:  1    Orders Placed This Encounter  Procedures   CBC with Differential/Platelet   Basic metabolic panel   PSA   Ambulatory referral to Urology    Referral Priority:   Routine    Referral Type:   Consultation    Referral Reason:   Specialty Services Required    Requested Specialty:   Urology    Number of Visits Requested:   1   POCT Urinalysis Dipstick (Automated)    Standing Status:   Future    Expiration Date:   01/20/2024    Patient Instructions  Urinalysis today.  Labs today  Try Myrbetriq  daily for bladder.  Lasix  refilled.  Limit caffeine, dark sodas, spicy foods which can irritate bladder.  Increase water during the day.  We will refer you to [facility]  Follow up plan: Return if symptoms worsen or fail to improve.  Anton Blas, MD

## 2023-12-18 NOTE — Patient Instructions (Addendum)
 Urinalysis today.  Labs today  Try Myrbetriq daily for bladder.  Lasix refilled.  Limit caffeine, dark sodas, spicy foods which can irritate bladder.  Increase water during the day.  We will refer you to [facility] urology

## 2023-12-19 DIAGNOSIS — N3941 Urge incontinence: Secondary | ICD-10-CM | POA: Insufficient documentation

## 2023-12-19 NOTE — Assessment & Plan Note (Signed)
 Continues PRN metoprolol, diltiazem, amiodarone

## 2023-12-19 NOTE — Assessment & Plan Note (Addendum)
 Chronic COPD with hypoxia, has home oxygen to use as needed. Denies dyspnea, wheeze, new cough or fever.

## 2023-12-19 NOTE — Assessment & Plan Note (Signed)
 Saw urology and rad onc s/p radiation therapy, continues regular rad onc f/u with Dr Rushie Chestnut but has not seen urology recently. Will update PSA.

## 2023-12-20 NOTE — Assessment & Plan Note (Addendum)
 Suspect component of neurogenic bladder from diabetes.  Stay off Jardiance at this time. Glipizide intolerance.  Continue metformin XR 500mg  BID. Consider GLP1RA for cardiovascular risk reduction, he's previously been hesitant for injectable medication

## 2023-12-20 NOTE — Assessment & Plan Note (Addendum)
 Longstanding history of urinary symptoms including nocturia, urgency, frequency, and what sound like bladder spasms at end of stream, predominant concern is worsening urge incontinence and overactive bladder symtpoms, acutely worse over the last few months. UCx last month with insignificant growth. Will update UA - was unable to provide sample, will bring one back.  He is not on SGLT2i - will not resume at this time given above symptoms.  Will price out Myrbetriq . Consider antimuscarinics.  I-PSS = 17-6.  In longstanding diabetes, suspect component of neurogenic bladder.  Will refer to urology for further evaluation. He previously saw [facility] for h/o prostate cancer Jacqulyne) - desires to establish locally in [place].

## 2023-12-20 NOTE — Assessment & Plan Note (Addendum)
 With noted progressive R>L pedal edema and borderline low oxygen, ?CHF contribution - will refill lasix with indications when to use.

## 2023-12-23 ENCOUNTER — Other Ambulatory Visit: Payer: Self-pay

## 2023-12-23 ENCOUNTER — Ambulatory Visit: Payer: Medicare HMO | Attending: Cardiovascular Disease

## 2023-12-23 DIAGNOSIS — Z5181 Encounter for therapeutic drug level monitoring: Secondary | ICD-10-CM

## 2023-12-23 DIAGNOSIS — I4891 Unspecified atrial fibrillation: Secondary | ICD-10-CM | POA: Diagnosis not present

## 2023-12-23 DIAGNOSIS — N3941 Urge incontinence: Secondary | ICD-10-CM

## 2023-12-23 LAB — POC URINALSYSI DIPSTICK (AUTOMATED)
Bilirubin, UA: NEGATIVE
Blood, UA: NEGATIVE
Glucose, UA: NEGATIVE
Ketones, UA: NEGATIVE
Nitrite, UA: NEGATIVE
Protein, UA: NEGATIVE
Spec Grav, UA: 1.015 (ref 1.010–1.025)
Urobilinogen, UA: 0.2 U/dL
pH, UA: 7 (ref 5.0–8.0)

## 2023-12-23 LAB — POCT INR: INR: 3.7 — AB (ref 2.0–3.0)

## 2023-12-23 NOTE — Patient Instructions (Signed)
 Hold today only then  Continue on same dosage of Warfarin 1.5 tablets daily. Recheck INR in 4 weeks

## 2023-12-23 NOTE — Addendum Note (Signed)
 Addended by: Lavonne Cass on: 12/23/2023 10:51 AM   Modules accepted: Orders

## 2023-12-24 ENCOUNTER — Encounter: Payer: Self-pay | Admitting: Family Medicine

## 2023-12-26 LAB — URINE CULTURE
MICRO NUMBER:: 15958919
SPECIMEN QUALITY:: ADEQUATE

## 2023-12-28 ENCOUNTER — Other Ambulatory Visit: Payer: Self-pay | Admitting: Family Medicine

## 2023-12-28 ENCOUNTER — Encounter: Payer: Self-pay | Admitting: Family Medicine

## 2023-12-28 MED ORDER — NITROFURANTOIN MONOHYD MACRO 100 MG PO CAPS: 100.0000 mg | ORAL_CAPSULE | Freq: Two times a day (BID) | ORAL | 0 refills | Status: AC

## 2024-01-13 ENCOUNTER — Other Ambulatory Visit: Payer: Self-pay | Admitting: Cardiovascular Disease

## 2024-01-15 ENCOUNTER — Other Ambulatory Visit: Payer: Self-pay | Admitting: Cardiovascular Disease

## 2024-01-15 DIAGNOSIS — I4891 Unspecified atrial fibrillation: Secondary | ICD-10-CM
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
## 2024-01-15 NOTE — Telephone Encounter (Signed)
 Refill Request.

## 2024-01-15 NOTE — Telephone Encounter (Signed)
 Prescription refill request received for warfarin Lov: 07/03/23 ([place])  Next INR check: 01/20/24 Warfarin tablet strength: 2.5mg   Appropriate dose. Refill sent.

## 2024-01-18 ENCOUNTER — Ambulatory Visit: Payer: Medicare HMO | Attending: Cardiovascular Disease | Admitting: Cardiovascular Disease

## 2024-01-18 ENCOUNTER — Encounter: Payer: Self-pay | Admitting: Cardiovascular Disease

## 2024-01-18 ENCOUNTER — Other Ambulatory Visit: Payer: Self-pay | Admitting: Family Medicine

## 2024-01-18 VITALS — BP 90/50 | HR 89 | Ht 73.0 in | Wt 228.0 lb

## 2024-01-18 DIAGNOSIS — I495 Sick sinus syndrome: Secondary | ICD-10-CM | POA: Diagnosis not present

## 2024-01-18 DIAGNOSIS — I25118 Atherosclerotic heart disease of native coronary artery with other forms of angina pectoris: Secondary | ICD-10-CM

## 2024-01-18 DIAGNOSIS — I480 Paroxysmal atrial fibrillation: Secondary | ICD-10-CM | POA: Diagnosis not present

## 2024-01-18 DIAGNOSIS — E782 Mixed hyperlipidemia: Secondary | ICD-10-CM | POA: Diagnosis not present

## 2024-01-18 DIAGNOSIS — I714 Abdominal aortic aneurysm, without rupture, unspecified: Secondary | ICD-10-CM

## 2024-01-18 DIAGNOSIS — R001 Bradycardia, unspecified: Secondary | ICD-10-CM

## 2024-01-18 DIAGNOSIS — E1140 Type 2 diabetes mellitus with diabetic neuropathy, unspecified: Secondary | ICD-10-CM | POA: Diagnosis not present

## 2024-01-18 DIAGNOSIS — I77810 Thoracic aortic ectasia: Secondary | ICD-10-CM

## 2024-01-18 DIAGNOSIS — G894 Chronic pain syndrome: Secondary | ICD-10-CM

## 2024-01-18 MED ORDER — METOPROLOL TARTRATE 25 MG PO TABS: ORAL_TABLET | ORAL | 1 refills | Status: DC

## 2024-01-18 NOTE — Patient Instructions (Signed)
 Medication Instructions:  ?No changes ? ?If you need a refill on your cardiac medications before your next appointment, please call your pharmacy.  ? ?Lab work: ?No new labs needed ? ?Testing/Procedures: ?No new testing needed ? ?Follow-Up: ?At [facility], you and your health needs are our priority.  As part of our continuing mission to provide you with exceptional heart care, we have created designated Provider Care Teams.  These Care Teams include your primary Cardiologist (physician) and Advanced Practice Providers (APPs -  Physician Assistants and Nurse Practitioners) who all work together to provide you with the care you need, when you need it. ? ?You will need a follow up appointment in 6 months, APP ok ? ?Providers on your designated Care Team:   ?Nicolasa Ducking, NP ?Eula Listen, PA-C ?Cadence Fransico Michael, PA-C ? ?COVID-19 Vaccine Information can be found at: [email or web address] For questions related to vaccine distribution or appointments, please email [email or web address] or call [phone].  ? ?

## 2024-01-19 NOTE — Telephone Encounter (Signed)
Message from pt via pharmacy:  I am requesting authorization refill for this medicine. Lately, I have  been lower back pain so this would be great if I could get this refill quickly. Thank you.   Name of Medication:  Hydrocodone-APAP Name of Pharmacy:  [facility] [address] Last Fill or Written Date and Quantity:  12/10/23, #120 Last Office Visit and Type:  12/18/23, urinary urgency Next Office Visit and Type:  none Last Controlled Substance Agreement Date:  07/06/17 Last UDS:  02/03/18

## 2024-01-20 ENCOUNTER — Ambulatory Visit: Payer: Medicare HMO | Attending: Cardiovascular Disease

## 2024-01-20 DIAGNOSIS — Z5181 Encounter for therapeutic drug level monitoring: Secondary | ICD-10-CM | POA: Diagnosis not present

## 2024-01-20 DIAGNOSIS — I4891 Unspecified atrial fibrillation: Secondary | ICD-10-CM

## 2024-01-20 LAB — POCT INR: INR: 4.7 — AB (ref 2.0–3.0)

## 2024-01-20 MED ORDER — HYDROCODONE-ACETAMINOPHEN 10-325 MG PO TABS: 1.0000 | ORAL_TABLET | Freq: Four times a day (QID) | ORAL | 0 refills | Status: DC | PRN

## 2024-01-20 NOTE — Patient Instructions (Signed)
Hold today and Thursday then DECREASE TO 1.5 tablets daily, EXCEPT 1 TABLET ON Mondays and Fridays Recheck INR in 3 weeks

## 2024-01-20 NOTE — Telephone Encounter (Signed)
ERx

## 2024-02-01 ENCOUNTER — Other Ambulatory Visit: Payer: Self-pay | Admitting: Family Medicine

## 2024-02-01 DIAGNOSIS — J449 Chronic obstructive pulmonary disease, unspecified: Secondary | ICD-10-CM

## 2024-02-02 ENCOUNTER — Telehealth: Payer: Self-pay | Admitting: Cardiovascular Disease

## 2024-02-02 ENCOUNTER — Encounter: Payer: Self-pay | Admitting: Family Medicine

## 2024-02-02 DIAGNOSIS — J449 Chronic obstructive pulmonary disease, unspecified: Secondary | ICD-10-CM

## 2024-02-02 NOTE — Telephone Encounter (Signed)
 Spoke with patients wife. She reports that patients heart rates are low at 41 but BP is good. Inquired about what medications he has taken. She reports no amiodarone but that he did take his morning dose of metoprolol. She said he was SOB yesterday and very weak. She did give him midodrine at 4:30 am and again at 07:30 am. She is concerned and not sure what is going on. Advised that I would forward this to provider for his review and recommendations and then I will call her back with that information. She verbalized understanding.

## 2024-02-02 NOTE — Telephone Encounter (Signed)
 Verbal discussion with Dr. Mariah Milling and he advised that patient hold his metoprolol until his heart rates improve. Patient has chronic low heart rates with weakness and SOB.   Discussed this with patients wife and she verbalized understanding and will continue monitoring and hold his metoprolol for now. No further needs at this time.

## 2024-02-02 NOTE — Telephone Encounter (Signed)
 STAT if HR is under 50 or over 120 (normal HR is 60-100 beats per minute)  What is your heart rate?  45 Mins ago BP 127/66 41  Do you have a log of your heart rate readings (document readings)? N/a  Do you have any other symptoms? Pt's wife stated she could notice labored breathing and he feels very weak.

## 2024-02-02 NOTE — Telephone Encounter (Signed)
 Albuterol inh Last filled:  01/17/23, #18 g Last OV:  12/18/23, urinary urgency Next OV:  none

## 2024-02-03 MED ORDER — ALBUTEROL SULFATE HFA 108 (90 BASE) MCG/ACT IN AERS: 2.0000 | INHALATION_SPRAY | Freq: Four times a day (QID) | RESPIRATORY_TRACT | 3 refills | Status: DC | PRN

## 2024-02-03 NOTE — Telephone Encounter (Signed)
 ERx

## 2024-02-09 ENCOUNTER — Telehealth: Payer: Self-pay | Admitting: Family Medicine

## 2024-02-09 NOTE — Telephone Encounter (Signed)
 Patient picked up ppwk for rx assistance.

## 2024-02-10 ENCOUNTER — Ambulatory Visit: Payer: Medicare HMO | Attending: Cardiovascular Disease

## 2024-02-10 DIAGNOSIS — I4891 Unspecified atrial fibrillation: Secondary | ICD-10-CM

## 2024-02-10 DIAGNOSIS — Z5181 Encounter for therapeutic drug level monitoring: Secondary | ICD-10-CM | POA: Diagnosis not present

## 2024-02-10 LAB — POCT INR: INR: 3.8 — AB (ref 2.0–3.0)

## 2024-02-10 NOTE — Patient Instructions (Signed)
 Hold tonight only then DECREASE TO 1 tablet daily, EXCEPT 1.5 TABLETS ON Mondays, Wednesdays and Fridays Recheck INR in 4 weeks

## 2024-02-16 ENCOUNTER — Other Ambulatory Visit: Payer: Self-pay | Admitting: Cardiovascular Disease

## 2024-02-24 ENCOUNTER — Other Ambulatory Visit: Payer: Self-pay | Admitting: Family Medicine

## 2024-02-24 ENCOUNTER — Encounter: Payer: Self-pay | Admitting: Family Medicine

## 2024-02-24 DIAGNOSIS — G894 Chronic pain syndrome: Secondary | ICD-10-CM

## 2024-02-24 MED ORDER — HYDROCODONE-ACETAMINOPHEN 10-325 MG PO TABS: 1.0000 | ORAL_TABLET | Freq: Four times a day (QID) | ORAL | 0 refills | Status: DC | PRN

## 2024-02-24 NOTE — Telephone Encounter (Signed)
 Name of Medication:  Hydrocodone-APAP Name of Pharmacy:  [facility] [address] Last Fill or Written Date and Quantity:  01/20/24, #120 Last Office Visit and Type:  12/18/23, urinary urgency Next Office Visit and Type:  none Last Controlled Substance Agreement Date:  07/06/17 Last UDS:  02/03/18

## 2024-02-24 NOTE — Telephone Encounter (Signed)
 ERx

## 2024-03-09 ENCOUNTER — Ambulatory Visit: Attending: Cardiovascular Disease

## 2024-03-09 DIAGNOSIS — Z5181 Encounter for therapeutic drug level monitoring: Secondary | ICD-10-CM | POA: Diagnosis not present

## 2024-03-09 DIAGNOSIS — I4891 Unspecified atrial fibrillation: Secondary | ICD-10-CM

## 2024-03-09 LAB — POCT INR: INR: 3.1 — AB (ref 2.0–3.0)

## 2024-03-09 NOTE — Patient Instructions (Signed)
 Take 1 tablet tonight only then continue 1 tablet daily, EXCEPT 1.5 TABLETS ON Mondays, Wednesdays and Fridays Recheck INR in 4 weeks

## 2024-03-14 ENCOUNTER — Ambulatory Visit (INDEPENDENT_AMBULATORY_CARE_PROVIDER_SITE_OTHER): Payer: Self-pay | Admitting: Urology

## 2024-03-14 DIAGNOSIS — N3941 Urge incontinence: Secondary | ICD-10-CM

## 2024-03-14 DIAGNOSIS — R32 Unspecified urinary incontinence: Secondary | ICD-10-CM

## 2024-03-14 MED ORDER — TAMSULOSIN HCL 0.4 MG PO CAPS: 0.4000 mg | ORAL_CAPSULE | Freq: Every day | ORAL | 11 refills | Status: AC
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
## 2024-03-24 ENCOUNTER — Other Ambulatory Visit: Payer: Self-pay | Admitting: Family Medicine

## 2024-03-24 DIAGNOSIS — R609 Edema, unspecified: Secondary | ICD-10-CM

## 2024-03-30 ENCOUNTER — Encounter: Payer: Self-pay | Admitting: Family Medicine

## 2024-04-04 ENCOUNTER — Other Ambulatory Visit: Payer: Self-pay | Admitting: Family Medicine

## 2024-04-04 NOTE — Telephone Encounter (Signed)
 Copied from CRM [id number]. Topic: Clinical - Medication Refill >> Apr 04, 2024 12:59 PM Turkey A wrote: Most Recent Primary Care Visit:  Provider: Claire Crick  Department: [facility]  Visit Type: OFFICE VISIT  Date: 12/18/2023  Medication: methocarbamol  (ROBAXIN ) 500 MG tablet  Has the patient contacted their pharmacy? Yes (Agent: If no, request that the patient contact the pharmacy for the refill. If patient does not wish to contact the pharmacy document the reason why and proceed with request.) (Agent: If yes, when and what did the pharmacy advise?)  Is this the correct pharmacy for this prescription? Yes If no, delete pharmacy and type the correct one.  This is the patient's preferred pharmacy:  [facility] #[id number] [place], [place] - [address] AT [facility] OF [address] & [address] [place] [address] Phone: [phone] Fax: [phone]  [facility] [address] [place] - [place], [place] - [address] [facility] AT [facility] [address] [place] [address] Phone: [phone] Fax: [phone]   Has the prescription been filled recently? No  Is the patient out of the medication? Yes  Has the patient been seen for an appointment in the last year OR does the patient have an upcoming appointment? Yes  Can we respond through MyChart? Yes  Agent: Please be advised that Rx refills may take up to 3 business days. We ask that you follow-up with your pharmacy.

## 2024-04-05 ENCOUNTER — Other Ambulatory Visit: Payer: Self-pay | Admitting: Family Medicine

## 2024-04-05 NOTE — Telephone Encounter (Signed)
 Copied from CRM [id number]. Topic: Clinical - Medication Refill >> Apr 04, 2024 12:59 PM Turkey A wrote: Most Recent Primary Care Visit:  Provider: Claire Crick  Department: [facility]  Visit Type: OFFICE VISIT  Date: 12/18/2023  Medication: methocarbamol  (ROBAXIN ) 500 MG tablet  Has the patient contacted their pharmacy? Yes (Agent: If no, request that the patient contact the pharmacy for the refill. If patient does not wish to contact the pharmacy document the reason why and proceed with request.) (Agent: If yes, when and what did the pharmacy advise?)  Is this the correct pharmacy for this prescription? Yes If no, delete pharmacy and type the correct one.  This is the patient's preferred pharmacy:  [facility] #[id number] [place], [place] - [address] AT [facility] [address] & [address] [place] [address] Phone: [phone] Fax: [phone]  [facility] [address] [place] - [place], [place] - [address] [facility] [address] [facility] [address] [place] [address] Phone: [phone] Fax: [phone]   Has the prescription been filled recently? No  Is the patient out of the medication? Yes  Has the patient been seen for an appointment in the last year OR does the patient have an upcoming appointment? Yes  Can we respond through MyChart? Yes  Agent: Please be advised that Rx refills may take up to 3 business days. We ask that you follow-up with your pharmacy. >> Apr 05, 2024  1:09 PM Kita Perish H wrote: Patient following up on his refill request for this methocarbamol  (ROBAXIN ) 500 MG tablet, patient wants prescription sent to the [facility] on file, patient states he's been out for 5 days.

## 2024-04-06 ENCOUNTER — Ambulatory Visit

## 2024-04-06 NOTE — Telephone Encounter (Signed)
 Rx already sent on 04/05/24, #180/1 refill to [facility].  Request denied.

## 2024-04-08 NOTE — Telephone Encounter (Signed)
 This was done.

## 2024-04-09 DIAGNOSIS — J449 Chronic obstructive pulmonary disease, unspecified: Secondary | ICD-10-CM | POA: Diagnosis not present

## 2024-04-11 ENCOUNTER — Other Ambulatory Visit: Payer: Self-pay | Admitting: Family Medicine

## 2024-04-11 DIAGNOSIS — G894 Chronic pain syndrome: Secondary | ICD-10-CM

## 2024-04-11 NOTE — Telephone Encounter (Unsigned)
 Copied from CRM [id number]. Topic: Clinical - Medication Refill >> Apr 11, 2024  1:39 PM Ovid Blow wrote: Most Recent Primary Care Visit:  Provider: Claire Crick  Department: [facility]  Visit Type: OFFICE VISIT  Date: 12/18/2023  Medication: HYDROcodone -acetaminophen  (NORCO) 10-325 MG tablet  Has the patient contacted their pharmacy? Yes (Agent: If no, request that the patient contact the pharmacy for the refill. If patient does not wish to contact the pharmacy document the reason why and proceed with request.) (Agent: If yes, when and what did the pharmacy advise?)  Is this the correct pharmacy for this prescription? Yes If no, delete pharmacy and type the correct one.  This is the patient's preferred pharmacy:  [facility] DR[facility] #[id number] [place], [place] - [address] AT [facility] OF [address] & [address] [place] [address] Phone: [phone] Fax: [phone]   Has the prescription been filled recently? No  Is the patient out of the medication? No  Has the patient been seen for an appointment in the last year OR does the patient have an upcoming appointment? Yes  Can we respond through MyChart? Yes  Agent: Please be advised that Rx refills may take up to 3 business days. We ask that you follow-up with your pharmacy.

## 2024-04-13 ENCOUNTER — Telehealth: Payer: Self-pay | Admitting: Family Medicine

## 2024-04-13 ENCOUNTER — Ambulatory Visit

## 2024-04-13 MED ORDER — HYDROCODONE-ACETAMINOPHEN 10-325 MG PO TABS: 1.0000 | ORAL_TABLET | Freq: Four times a day (QID) | ORAL | 0 refills | Status: DC | PRN

## 2024-04-13 NOTE — Telephone Encounter (Signed)
 Spoke with wife Caryl Clas. She was upset about the delay in getting methocarbamol  refilled. I explained partly my fault in the delay in filling. She was appreciative of the call.  I did see where there was a refill request for his hydrocodone  on 5/5 which has not yet been sent to me for review. I went ahead and refilled this.

## 2024-04-13 NOTE — Telephone Encounter (Signed)
 Copied from CRM [id number]. Topic: General - Other >> Apr 13, 2024  1:26 PM Turkey A wrote: Reason for CRM: Patient's wife [name] would like for Dr. Mariam Shingles to call her personally regarding an issue she has with the change in process at the office and staff. Her call back number is [phone]

## 2024-04-13 NOTE — Telephone Encounter (Signed)
 ERx

## 2024-04-15 ENCOUNTER — Other Ambulatory Visit: Payer: Self-pay | Admitting: Cardiovascular Disease

## 2024-04-15 DIAGNOSIS — I4891 Unspecified atrial fibrillation: Secondary | ICD-10-CM

## 2024-04-16 DIAGNOSIS — J449 Chronic obstructive pulmonary disease, unspecified: Secondary | ICD-10-CM | POA: Diagnosis not present

## 2024-04-20 ENCOUNTER — Ambulatory Visit: Attending: Cardiovascular Disease

## 2024-04-20 DIAGNOSIS — Z5181 Encounter for therapeutic drug level monitoring: Secondary | ICD-10-CM

## 2024-04-20 DIAGNOSIS — I4891 Unspecified atrial fibrillation: Secondary | ICD-10-CM | POA: Diagnosis not present

## 2024-04-20 LAB — POCT INR: INR: 3.5 — AB (ref 2.0–3.0)

## 2024-04-20 NOTE — Patient Instructions (Signed)
 Hold tonight  only then continue 1 tablet daily, EXCEPT 1.5 TABLETS ON Mondays, Wednesdays and Fridays Recheck INR in 4 weeks [phone]

## 2024-05-05 ENCOUNTER — Other Ambulatory Visit: Payer: Self-pay | Admitting: Cardiovascular Disease

## 2024-05-10 DIAGNOSIS — J449 Chronic obstructive pulmonary disease, unspecified: Secondary | ICD-10-CM | POA: Diagnosis not present

## 2024-05-17 DIAGNOSIS — J449 Chronic obstructive pulmonary disease, unspecified: Secondary | ICD-10-CM | POA: Diagnosis not present

## 2024-05-18 ENCOUNTER — Ambulatory Visit: Attending: Cardiovascular Disease

## 2024-05-18 DIAGNOSIS — Z5181 Encounter for therapeutic drug level monitoring: Secondary | ICD-10-CM | POA: Diagnosis not present

## 2024-05-18 DIAGNOSIS — I4891 Unspecified atrial fibrillation: Secondary | ICD-10-CM | POA: Diagnosis not present

## 2024-05-18 LAB — POCT INR: INR: 2.9 (ref 2.0–3.0)

## 2024-05-18 NOTE — Patient Instructions (Signed)
 continue 1 tablet daily, EXCEPT 1.5 TABLETS ON Mondays, Wednesdays and Fridays Recheck INR in 6 weeks [phone]

## 2024-05-30 ENCOUNTER — Ambulatory Visit: Admitting: Urology

## 2024-05-30 VITALS — BP 128/66 | HR 67 | Ht 72.0 in | Wt 219.0 lb

## 2024-05-30 DIAGNOSIS — N3941 Urge incontinence: Secondary | ICD-10-CM

## 2024-05-30 LAB — URINALYSIS, COMPLETE
Bilirubin, UA: NEGATIVE
Glucose, UA: NEGATIVE
Ketones, UA: NEGATIVE
Leukocytes,UA: NEGATIVE
Nitrite, UA: NEGATIVE
RBC, UA: NEGATIVE
Specific Gravity, UA: 1.03 (ref 1.005–1.030)
Urobilinogen, Ur: 0.2 mg/dL (ref 0.2–1.0)
pH, UA: 5.5 (ref 5.0–7.5)

## 2024-05-30 LAB — MICROSCOPIC EXAMINATION: Epithelial Cells (non renal): >10 /HPF — AB (ref 0–10)

## 2024-05-30 MED ORDER — TRIMETHOPRIM 100 MG PO TABS: 100.0000 mg | ORAL_TABLET | Freq: Every day | ORAL | 11 refills | Status: AC
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
## 2024-05-31 ENCOUNTER — Encounter: Payer: Self-pay | Admitting: Family Medicine

## 2024-05-31 DIAGNOSIS — G894 Chronic pain syndrome: Secondary | ICD-10-CM

## 2024-05-31 MED ORDER — HYDROCODONE-ACETAMINOPHEN 10-325 MG PO TABS: 1.0000 | ORAL_TABLET | Freq: Four times a day (QID) | ORAL | 0 refills | Status: DC | PRN

## 2024-05-31 NOTE — Telephone Encounter (Signed)
 ERx

## 2024-05-31 NOTE — Telephone Encounter (Signed)
 Name of Medication:  Hydrocodone -APAP Name of Pharmacy:  [facility] [address] Last Fill or Written Date and Quantity:  04/13/24, #120 Last Office Visit and Type:  12/18/23, urinary urgency Next Office Visit and Type:  none Last Controlled Substance Agreement Date:  07/06/17 Last UDS:  02/03/18

## 2024-06-02 LAB — CULTURE, URINE COMPREHENSIVE

## 2024-06-09 DIAGNOSIS — J449 Chronic obstructive pulmonary disease, unspecified: Secondary | ICD-10-CM | POA: Diagnosis not present

## 2024-06-16 DIAGNOSIS — J449 Chronic obstructive pulmonary disease, unspecified: Secondary | ICD-10-CM | POA: Diagnosis not present

## 2024-06-26 ENCOUNTER — Other Ambulatory Visit: Payer: Self-pay | Admitting: Family Medicine

## 2024-06-26 DIAGNOSIS — J449 Chronic obstructive pulmonary disease, unspecified: Secondary | ICD-10-CM

## 2024-06-27 ENCOUNTER — Encounter: Payer: Self-pay | Admitting: Family Medicine

## 2024-06-27 ENCOUNTER — Ambulatory Visit (INDEPENDENT_AMBULATORY_CARE_PROVIDER_SITE_OTHER): Admitting: Family Medicine

## 2024-06-27 VITALS — BP 120/60 | HR 56 | Temp 98.6°F | Ht 72.0 in | Wt 222.0 lb

## 2024-06-27 DIAGNOSIS — E1140 Type 2 diabetes mellitus with diabetic neuropathy, unspecified: Secondary | ICD-10-CM | POA: Diagnosis not present

## 2024-06-27 DIAGNOSIS — N3941 Urge incontinence: Secondary | ICD-10-CM

## 2024-06-27 DIAGNOSIS — J449 Chronic obstructive pulmonary disease, unspecified: Secondary | ICD-10-CM

## 2024-06-27 DIAGNOSIS — R413 Other amnesia: Secondary | ICD-10-CM | POA: Diagnosis not present

## 2024-06-27 DIAGNOSIS — G4733 Obstructive sleep apnea (adult) (pediatric): Secondary | ICD-10-CM

## 2024-06-27 DIAGNOSIS — R251 Tremor, unspecified: Secondary | ICD-10-CM | POA: Diagnosis not present

## 2024-06-27 DIAGNOSIS — I480 Paroxysmal atrial fibrillation: Secondary | ICD-10-CM | POA: Diagnosis not present

## 2024-06-27 DIAGNOSIS — F1120 Opioid dependence, uncomplicated: Secondary | ICD-10-CM | POA: Diagnosis not present

## 2024-06-27 DIAGNOSIS — G8929 Other chronic pain: Secondary | ICD-10-CM | POA: Diagnosis not present

## 2024-06-27 DIAGNOSIS — R2681 Unsteadiness on feet: Secondary | ICD-10-CM

## 2024-06-27 DIAGNOSIS — G6289 Other specified polyneuropathies: Secondary | ICD-10-CM

## 2024-06-27 LAB — POCT GLYCOSYLATED HEMOGLOBIN (HGB A1C): Hemoglobin A1C: 6.8 % — AB (ref 4.0–5.6)

## 2024-06-27 MED ORDER — METOPROLOL SUCCINATE ER 25 MG PO TB24: 12.5000 mg | ORAL_TABLET | Freq: Every day | ORAL | 3 refills | Status: DC

## 2024-06-27 MED ORDER — GABAPENTIN 600 MG PO TABS: 600.0000 mg | ORAL_TABLET | Freq: Every day | ORAL | 1 refills | Status: DC

## 2024-06-27 NOTE — Patient Instructions (Addendum)
 We will see if you may qualify for patient assistance for Breztri  Drop gabapentin  to nightly only.  Start long acting metoprolol  (Toprol  XL) 25mg  1/2 tablet daily for tremors and heart rate. Return in 4-6 weeks for follow up visit   Reviewed 4 core lifestyle modifications to support a healthy mind:  1. Nutritious well balance diet.  2. Regular physical activity routine.  3. Regular mental activity such as reading books, word puzzles, math puzzles, jigsaw puzzles.  4. Social engagement.  Also ensure good blood pressure control, limit alcohol, no smoking.

## 2024-06-27 NOTE — Progress Notes (Unsigned)
 Ph: [phone] Fax: [phone]   Patient ID: [name], male    DOB: 1945-12-30, 78 y.o.   MRN: [id number]  This visit was conducted in person.  BP 120/60   Pulse (!) 56   Temp 98.6 F (37 C) (Oral)   Ht 6' (1.829 m)   Wt 222 lb (100.7 kg)   SpO2 98%   BMI 30.11 kg/m    CC: discuss tremors, memory  Subjective:   HPI: [name] is a 78 y.o. male presenting on 06/27/2024 for Tremors (Patient is having memory issues increased in last 6 months. /Patient accompanied by wife [name] //)   Worsening memory difficulty over the past 6 months.   Notes worsening tremors over last several months R>L - especially noticing when he drinks a cup of coffee in the morning - ha sto use both hands, also while eating, trouble holding fork/spoon. No head shaking. Staying unsteady on his feet - regularly wearing lumbar spine brace.   No anosmia, no significant mood/personality/mood changes although wife notes he gets more easily annoyed/irritated.  No abnormal movement/behavior in sleep.  Notes stiff movements with walking.   Had a fall this past week walking on gravel, hit the back of his head. Was going up ramp with his scooter, scooter may have landed on top of him but no extremity injury. Notes soreness to the touch below R eye. No headache.   Sleeping more over the past few months He continues using cane for ambulation.   Saw urology Dr Gaston, latest 05/30/2024 with cystoscopy. Started on trimethoprim  100mg  daily preventatively concern for recurrent UTI, rec continue flomax . No signs of bladder cancer.   Not taking breztri  - only using albuterol  inhaler nightly.   DM - only on metformin  XR 500mg  bid and  Lab Results  Component Value Date   HGBA1C 6.8 (A) 06/27/2024    Geriatric Assessment: Activities of Daily Living:     Bathing- dependent - doesn't take shower - unable to get in and out of garden bathtub. Shower is currently too small  - uses wash cloth, wife helps  him     Dressing- independent     Eating- independent    Toileting- independent     Transferring- partially dependent     Continence- independent  Overall Assessment: largely independent   Instrumental Activities of Daily Living:     Transportation- independent     Meal/Food Preparation- dependent     Shopping Errands- independent     Housekeeping/Chores- dependent     Money Management/Finances- dependent    Medication Management- dependent     Ability to Use Telephone- independent     Laundry- dependent  Overall Assessment: dependent   Mental Status Exam: 24/30 (value/max value) Clock Drawing Score: 3/4     Relevant past medical, surgical, family and social history reviewed and updated as indicated. Interim medical history since our last visit reviewed. Allergies and medications reviewed and updated. Outpatient Medications Prior to Visit  Medication Sig Dispense Refill   albuterol  (VENTOLIN  HFA) 108 (90 Base) MCG/ACT inhaler INHALE 2 PUFFS INTO THE LUNGS EVERY 6 HOURS AS NEEDED FOR WHEEZING OR SHORTNESS OF BREATH 18 g 3   aspirin  81 MG EC tablet Take 1 tablet (81 mg total) by mouth daily. Swallow whole.     Budeson-Glycopyrrol-Formoterol (BREZTRI  AEROSPHERE) 160-9-4.8 MCG/ACT AERO Inhale 2 puffs into the lungs in the morning and at bedtime. 3 each 3   Cholecalciferol  (VITAMIN D ) 50 MCG (2000 UT)  CAPS Take 1 capsule (2,000 Units total) by mouth daily. 30 capsule    Coenzyme Q10 (COQ-10 PO) Take 1 tablet by mouth daily.     Cyanocobalamin  (B-12) 1000 MCG SUBL Place 1 tablet under the tongue every Monday, Wednesday, and Friday. 30 tablet    diclofenac  Sodium (VOLTAREN ) 1 % GEL Apply 4 g topically 3 (three) times daily. As needed 100 g 3   diltiazem  (CARDIZEM ) 30 MG tablet TAKE 1 TABLET(30 MG) BY MOUTH DAILY AS NEEDED FOR FAST HEART RATE 30 tablet 3   diphenoxylate -atropine  (LOMOTIL ) 2.5-0.025 MG tablet Take 1 tablet by mouth 2 (two) times daily as needed. 20 tablet 0   fish  oil-omega-3 fatty acids 1000 MG capsule Take 2 g by mouth daily.     furosemide  (LASIX ) 20 MG tablet TAKE 1 TABLET BY MOUTH EVERY DAY AS NEEDED FOR FLUID RETENTION 90 tablet 1   HYDROcodone -acetaminophen  (NORCO) 10-325 MG tablet Take 1 tablet by mouth every 6 (six) hours as needed for moderate pain (pain score 4-6) or severe pain (pain score 7-10). 120 tablet 0   ipratropium-albuterol  (DUONEB) 0.5-2.5 (3) MG/3ML SOLN Take 3 mLs by nebulization every 6 (six) hours as needed. 360 mL 6   loratadine  (CLARITIN ) 10 MG tablet Take 1 tablet (10 mg total) by mouth daily.     metFORMIN  (GLUCOPHAGE -XR) 500 MG 24 hr tablet Take 1 tablet (500 mg total) by mouth 2 (two) times daily with a meal. 180 tablet 4   methocarbamol  (ROBAXIN ) 500 MG tablet TAKE 1 TABLET(500 MG) BY MOUTH EVERY 8 HOURS AS NEEDED FOR MUSCLE SPASMS 180 tablet 1   midodrine  (PROAMATINE ) 10 MG tablet Take 1 tablet (10 mg total) by mouth 3 (three) times daily as needed. 90 tablet 1   nitroGLYCERIN  (NITROLINGUAL ) 0.4 MG/SPRAY spray USE 1 SPRAY AS DIRECTED EVERY 5 MINUTES AS NEEDED 4.9 g 1   omeprazole  (PRILOSEC) 40 MG capsule Take 1 capsule (40 mg total) by mouth daily. 90 capsule 4   OXYGEN  Inhale 3 L into the lungs at bedtime.     pravastatin  (PRAVACHOL ) 20 MG tablet TAKE 1 TABLET(20 MG) BY MOUTH DAILY 90 tablet 4   spironolactone  (ALDACTONE ) 25 MG tablet Take 0.5 tablets (12.5 mg total) by mouth daily. 45 tablet 4   tamsulosin  (FLOMAX ) 0.4 MG CAPS capsule Take 1 capsule (0.4 mg total) by mouth daily. 30 capsule 11   trimethoprim  (TRIMPEX ) 100 MG tablet Take 1 tablet (100 mg total) by mouth daily. 30 tablet 11   warfarin (COUMADIN ) 2.5 MG tablet TAKE 1 AND 1/2 TABLETS BY MOUTH EVERY DAY OR AS DIRECTED AFTER ANTI-COAG CLINIC 150 tablet 0   gabapentin  (NEURONTIN ) 600 MG tablet Take 2 tablets (1,200 mg total) by mouth 2 (two) times daily. 360 tablet 1   metoprolol  tartrate (LOPRESSOR ) 25 MG tablet TAKE 1 TABLET BY MOUTH TWICE DAILY FOR FAST HEART  RATE>100 180 tablet 1   amiodarone  (PACERONE ) 200 MG tablet TAKE 1 TABLET BY MOUTH AS NEEDED FOR HEART RATE> 100 60 tablet 8   Ferrous Sulfate  (IRON) 325 (65 Fe) MG TABS Take by mouth daily. (Patient not taking: Reported on 06/27/2024)     No facility-administered medications prior to visit.     Per HPI unless specifically indicated in ROS section below Review of Systems  Objective:  BP 120/60   Pulse (!) 56   Temp 98.6 F (37 C) (Oral)   Ht 6' (1.829 m)   Wt 222 lb (100.7 kg)   SpO2 98%  BMI 30.11 kg/m   Wt Readings from Last 3 Encounters:  06/27/24 222 lb (100.7 kg)  05/30/24 219 lb (99.3 kg)  01/18/24 228 lb (103.4 kg)      Physical Exam Vitals and nursing note reviewed.  Constitutional:      Appearance: Normal appearance. He is not ill-appearing.     Comments:  Sitting in wheelchair Tired appearing  HENT:     Head: Normocephalic.      Comments:  Bruising to R parietal skull from recent fall without step off or depression Soreness to R infraorbital skull without depression/step off or significant bruising    Mouth/Throat:     Mouth: Mucous membranes are moist.     Pharynx: Oropharynx is clear. No oropharyngeal exudate or posterior oropharyngeal erythema.  Eyes:     Extraocular Movements: Extraocular movements intact.  Cardiovascular:     Rate and Rhythm: Normal rate and regular rhythm.     Pulses: Normal pulses.     Heart sounds: Normal heart sounds. No murmur heard. Pulmonary:     Effort: Pulmonary effort is normal. No respiratory distress.     Breath sounds: Normal breath sounds. No wheezing, rhonchi or rales.  Musculoskeletal:     Right lower leg: No edema.     Left lower leg: No edema.  Skin:    General: Skin is warm and dry.     Findings: No rash.  Neurological:     General: No focal deficit present.     Mental Status: He is alert.     Cranial Nerves: Cranial nerves 2-12 are intact.     Sensory: Sensation is intact.     Motor: Tremor present.      Coordination: Coordination is intact.     Comments:  CN 2-12 intact FTN intact EOMI No pronator drift  Neg romberg Unsteady gait without shuffling gait Mild resting tremor present, predominant action intention tremor with component of worsening with posture  Psychiatric:        Mood and Affect: Mood normal.        Behavior: Behavior normal.       Results for orders placed or performed in visit on 06/27/24  POCT glycosylated hemoglobin (Hb A1C)   Collection Time: 06/27/24  3:22 PM  Result Value Ref Range   Hemoglobin A1C 6.8 (A) 4.0 - 5.6 %   HbA1c POC (<> result, manual entry)     HbA1c, POC (prediabetic range)     HbA1c, POC (controlled diabetic range)     *Note: Due to a large number of results and/or encounters for the requested time period, some results have not been displayed. A complete set of results can be found in Results Review.   Lab Results  Component Value Date   NA 140 12/18/2023   CL 102 12/18/2023   K 4.4 12/18/2023   CO2 27 12/18/2023   BUN 15 12/18/2023   CREATININE 1.21 12/18/2023   GFR 56.90 (L) 12/18/2023   CALCIUM  8.7 12/18/2023   PHOS 3.7 11/03/2018   ALBUMIN 3.5 09/15/2023   GLUCOSE 236 (H) 12/18/2023   Lab Results  Component Value Date   TSH 1.40 05/06/2023    Lab Results  Component Value Date   VITAMINB12 777 09/15/2023   No results found for: RPR  Assessment & Plan:   Problem List Items Addressed This Visit     Encounter for chronic pain management (Chronic)   [place] CSRS reviewed.  States he uses hydrocodone  10/325mg  BID on average.  Opiate dependence (HCC) (Chronic)   Long term hydrocodone  use.       Atrial fibrillation (HCC)   Continues coumadin  with PRN metoprolol , dilt, amiodarone  See below re: metoprolol  succinate daily trial for tremor      Relevant Medications   metoprolol  succinate (TOPROL -XL) 25 MG 24 hr tablet   COPD mixed type (HCC)   Chronic COPD with hypoxia. Has home oxygen  - uses at night.  Only on  albuterol  inhaler nightly. Breztri  was unaffordable - will refer to pharmacy team to see if eligible for PAP.       Relevant Orders   AMB Referral VBCI Care Management   Type 2 diabetes mellitus with diabetic neuropathy (HCC)   A1c remains well controlled on metformin  - continue.       Relevant Orders   POCT glycosylated hemoglobin (Hb A1C) (Completed)   OSA (obstructive sleep apnea)   H/o severe OSA on prior sleep study 2013, CPAP intolerant.  Hypoxia could contribute to tremor, memory issues. He does use nocturnal oxygen .       General unsteadiness   Peripheral neuropathy   Drop gabapentin  to 600mg  nightly. Monitor effect on unsteadiness, daytime sleepiness/sedation.       Relevant Medications   gabapentin  (NEURONTIN ) 600 MG tablet   Tremor - Primary   Story/exam most consistent with essential tremor, r/o parkinsonism.  Will drop gabapentin  from 600mg  BID to to 600mg  nightly.  He is not interested in propranolol, already on metoprolol  PRN.  Will change PRN metoprolol  tartrate to Toprol  XL 12.5mg  daily scheduled.  RTC 4-6 wks f/u visit      Urge urinary incontinence   Established with urology Dr Gaston s/p reassuring cystoscopy.  Started tmp 100mg  ppx daily for possible recurrent UTIs.  Avoiding antimuscarinics with new memory concerns.  To consider myrbetriq / gemtessa.       Memory impairment   Reviewed impaired MMSE with patient and wife of 24/30, CDT 3/4.  ?MCI. Not consistent with parkinson's.  Offered neurology evaluation - he defers for now.  Discussed starting SSRI for irritability - will also defer for now.  For now, will drop gabapentin  dose and reassess at 4-6 wk f/u visit.  Consider updating labwork next visit (TSH, B12, RPR).  Provided with lifestyle interventions to support a healthy mind as per instructions.        Meds ordered this encounter  Medications   gabapentin  (NEURONTIN ) 600 MG tablet    Sig: Take 1 tablet (600 mg total) by mouth at  bedtime.    Dispense:  90 tablet    Refill:  1    Note new dose   metoprolol  succinate (TOPROL -XL) 25 MG 24 hr tablet    Sig: Take 0.5 tablets (12.5 mg total) by mouth daily. For heart rate and tremor    Dispense:  45 tablet    Refill:  3    Orders Placed This Encounter  Procedures   AMB Referral VBCI Care Management    Referral Priority:   Routine    Referral Type:   Consultation    Referral Reason:   Care Coordination    Number of Visits Requested:   1   POCT glycosylated hemoglobin (Hb A1C)    Patient Instructions  We will see if you may qualify for patient assistance for Breztri  Drop gabapentin  to nightly only.  Start long acting metoprolol  (Toprol  XL) 25mg  1/2 tablet daily for tremors and heart rate. Return in 4-6 weeks for follow up visit   Reviewed 4  core lifestyle modifications to support a healthy mind:  1. Nutritious well balance diet.  2. Regular physical activity routine.  3. Regular mental activity such as reading books, word puzzles, math puzzles, jigsaw puzzles.  4. Social engagement.  Also ensure good blood pressure control, limit alcohol, no smoking.    Follow up plan: Return if symptoms worsen or fail to improve.  Anton Blas, MD

## 2024-06-28 ENCOUNTER — Telehealth: Payer: Self-pay

## 2024-06-28 ENCOUNTER — Ambulatory Visit: Payer: Self-pay | Admitting: Family Medicine

## 2024-06-28 NOTE — Assessment & Plan Note (Signed)
 Chronic COPD with hypoxia. Has home oxygen  - uses at night.  Only on albuterol  inhaler nightly. Breztri  was unaffordable - will refer to pharmacy team to see if eligible for PAP.

## 2024-06-28 NOTE — Assessment & Plan Note (Addendum)
 Reviewed impaired MMSE with patient and wife of 24/30, CDT 3/4.  ?MCI. Not consistent with parkinson's.  Offered neurology evaluation - he defers for now.  Discussed starting SSRI for irritability - will also defer for now.  For now, will drop gabapentin  dose and reassess at 4-6 wk f/u visit.  Consider updating labwork next visit (TSH, B12, RPR).  Provided with lifestyle interventions to support a healthy mind as per instructions.

## 2024-06-28 NOTE — Assessment & Plan Note (Signed)
Longterm hydrocodone use.

## 2024-06-28 NOTE — Assessment & Plan Note (Signed)
 Story/exam most consistent with essential tremor, r/o parkinsonism.  Will drop gabapentin  from 600mg  BID to to 600mg  nightly.  He is not interested in propranolol, already on metoprolol  PRN.  Will change PRN metoprolol  tartrate to Toprol  XL 12.5mg  daily scheduled.  RTC 4-6 wks f/u visit

## 2024-06-28 NOTE — Assessment & Plan Note (Addendum)
 H/o severe OSA on prior sleep study 2013, CPAP intolerant.  Hypoxia could contribute to tremor, memory issues. He does use nocturnal oxygen .

## 2024-06-28 NOTE — Assessment & Plan Note (Addendum)
 [place] CSRS reviewed.  States he uses hydrocodone  10/325mg  BID on average.

## 2024-06-28 NOTE — Progress Notes (Unsigned)
 Complex Care Management Note Care Guide Note  06/28/2024 Name: [name] MRN: [id number] DOB: 1945-12-21   Complex Care Management Outreach Attempts: An unsuccessful telephone outreach was attempted today to offer the patient information about available complex care management services.  Follow Up Plan:  Additional outreach attempts will be made to offer the patient complex care management information and services.   Encounter Outcome:  No Answer  Leotis Rase [facility], [facility] Guide  Direct Dial: [phone]  Fax [phone]

## 2024-06-28 NOTE — Assessment & Plan Note (Signed)
 Continues coumadin  with PRN metoprolol , dilt, amiodarone  See below re: metoprolol  succinate daily trial for tremor

## 2024-06-28 NOTE — Assessment & Plan Note (Signed)
 A1c remains well controlled on metformin  - continue.

## 2024-06-28 NOTE — Assessment & Plan Note (Signed)
 Established with urology Dr Gaston s/p reassuring cystoscopy.  Started tmp 100mg  ppx daily for possible recurrent UTIs.  Avoiding antimuscarinics with new memory concerns.  To consider myrbetriq / gemtessa.

## 2024-06-28 NOTE — Assessment & Plan Note (Signed)
 Drop gabapentin  to 600mg  nightly. Monitor effect on unsteadiness, daytime sleepiness/sedation.

## 2024-06-29 ENCOUNTER — Ambulatory Visit: Attending: Cardiovascular Disease

## 2024-06-29 DIAGNOSIS — Z5181 Encounter for therapeutic drug level monitoring: Secondary | ICD-10-CM

## 2024-06-29 DIAGNOSIS — I4891 Unspecified atrial fibrillation: Secondary | ICD-10-CM | POA: Diagnosis not present

## 2024-06-29 LAB — POCT INR: INR: 2.2 (ref 2.0–3.0)

## 2024-06-29 NOTE — Progress Notes (Unsigned)
 Complex Care Management Note Care Guide Note  06/29/2024 Name: THEODORO KOVAL MRN: [id number] DOB: 11-Feb-1946   Complex Care Management Outreach Attempts: A second unsuccessful outreach was attempted today to offer the patient with information about available complex care management services.  Follow Up Plan:  Additional outreach attempts will be made to offer the patient complex care management information and services.   Encounter Outcome:  No Answer  Leotis Rase [facility], [facility] Guide  Direct Dial: [phone]  Fax [phone]

## 2024-06-29 NOTE — Patient Instructions (Signed)
 continue 1 tablet daily, EXCEPT 1.5 TABLETS ON Mondays, Wednesdays and Fridays Recheck INR in 6 weeks [phone]

## 2024-06-30 ENCOUNTER — Telehealth: Payer: Self-pay

## 2024-06-30 NOTE — Progress Notes (Signed)
 Complex Care Management Note  Care Guide Note 06/30/2024 Name: [name] MRN: [id number] DOB: [birth year]  AMANDO CHAPUT is a 78 y.o. year old male who sees Rilla Baller, MD for primary care. I reached out to Taiquan W Romano by phone today to offer complex care management services.  Mr. Joslyn was given information about Complex Care Management services today including:   The Complex Care Management services include support from the care team which includes your Nurse Care Manager, Clinical Social Worker, or Pharmacist.  The Complex Care Management team is here to help remove barriers to the health concerns and goals most important to you. Complex Care Management services are voluntary, and the patient may decline or stop services at any time by request to their care team member.   Complex Care Management Consent Status: Patient agreed to services and verbal consent obtained.   Follow up plan:  Telephone appointment with complex care management team member scheduled for:  07/11/24 @ 2 pm.   Encounter Outcome:  Patient Scheduled . SABRA Eleuterio [facility]  [facility], [facility] Guide  Direct Dial: [phone]  Fax [phone]

## 2024-07-01 ENCOUNTER — Telehealth: Payer: Self-pay | Admitting: Cardiovascular Disease

## 2024-07-01 NOTE — Telephone Encounter (Signed)
 A call was received from the patient's wife reporting his heart rate (HR) is ranging from 43-49 bpm, and he isn't feeling good. The patient denies chest pain or dizziness but states he has no energy.  His wife mentioned a recent visit with Dr. Anton Blas for tremors, where his PRN metoprolol  tartrate was changed to Toprol  XL 12.5mg  daily. The patient started the Toprol  XL on July 23rd, and is continuing his other medications.  Currently, the patient is lying down and using his O2 concentrator. His blood pressure (BP) is 129/68.  The patient was strongly advised to go to the Emergency Department (ED) for evaluation of his low heart rate, but he declined, stating he had no energy and preferred to rest.  Current advice provided to the patient:  Call 911 or go to ED if symptoms worsen.  Hold Cardizem  Amiodarone  Toprol  XL that will effect his heart rate  This information will be sent to Dr. Gollan for further advice.   Pt and pt's wife verbalzied understanding

## 2024-07-01 NOTE — Telephone Encounter (Signed)
 Called and spoke with wife per DPR. Notified wife of the following from Dr. Gollan.  In the past has been taking diltiazem  30 and amiodarone  as needed for elevated rate over 70   On last clinic visit with me was taking metoprolol  tartrate 25 twice daily and felt fine     He has had low heart rates in the past in the 40s, recent blood pressures look stable If he does not like the way he feels, could be from the new metoprolol  succinate 12.5 daily would recommend he hold it He can go back on the metoprolol  tartrate as he was taking previously and see if he feels better I do not see an indication to go to the emergency room, long history of chronic bradycardia Thx TGollan  Wife verbalizes understanding. Wife states patient is feeling better and is resting. Wife reports that they will hold Metoprolol  Succinate and take Metoprolol  Tartrate as needed. Wife states that they do not need a prescription for Metoprolol  Tartrate at this time.

## 2024-07-01 NOTE — Telephone Encounter (Signed)
 What is your heart rate?  130/68 42 about 5 minutes ago   Do you have a log of your heart rate readings (document readings)?  104/53 47 about 45 minutes ago   Do you have any other symptoms?  Per wife, patient states he is just not feeling good.

## 2024-07-09 ENCOUNTER — Other Ambulatory Visit: Payer: Self-pay | Admitting: Cardiovascular Disease

## 2024-07-10 DIAGNOSIS — J449 Chronic obstructive pulmonary disease, unspecified: Secondary | ICD-10-CM | POA: Diagnosis not present

## 2024-07-12 DIAGNOSIS — L538 Other specified erythematous conditions: Secondary | ICD-10-CM | POA: Diagnosis not present

## 2024-07-12 DIAGNOSIS — L820 Inflamed seborrheic keratosis: Secondary | ICD-10-CM | POA: Diagnosis not present

## 2024-07-12 DIAGNOSIS — D485 Neoplasm of uncertain behavior of skin: Secondary | ICD-10-CM | POA: Diagnosis not present

## 2024-07-14 ENCOUNTER — Other Ambulatory Visit (INDEPENDENT_AMBULATORY_CARE_PROVIDER_SITE_OTHER): Admitting: Pharmacist

## 2024-07-14 ENCOUNTER — Other Ambulatory Visit: Payer: Self-pay | Admitting: Cardiovascular Disease

## 2024-07-14 DIAGNOSIS — J449 Chronic obstructive pulmonary disease, unspecified: Secondary | ICD-10-CM

## 2024-07-14 DIAGNOSIS — I4891 Unspecified atrial fibrillation: Secondary | ICD-10-CM

## 2024-07-14 NOTE — Patient Instructions (Signed)
 Mr. [name],   It was a pleasure to speak with you today! As we discussed:?  We have initiated your application for the following medication assistance program:   [employer] ([place] and [place]) Medication: Breztri  inhaler Phone: [phone] M-F 9am-6pm ET    Fax: [phone]  What to Expect: Once we have received your signature, we will fax your application to the program.  The program typically reviews applications within 3 business days but may require additional time during periods of higher volume, especially during January and February. Once reviewed, the program will send you and your doctor a letter informing you of your approval or denial.  For any questions after your application has been submitted, you may contact the program directly by phone. To Check your application status For Questions regarding your medication shipment or delivery details To see when your next medication refill will be shipped out To request medication refills  If you have any further question/concerns, please let us  know.  You may leave me a voicemail at [phone] and I will get back to you shortly.   Manuelita FABIENE Kobs, PharmD Clinical Pharmacist [facility], [place] Ph: [phone]

## 2024-07-14 NOTE — Telephone Encounter (Signed)
 Prescription refill request received for warfarin Lov: 07/03/23 (Gollan)  Next INR check: 08/10/24 Warfarin tablet strength: 2.5mg   Office visit overdue. Note placed on upcoming coumadin  clinic appt for pt to schedule overdue office visit with cardiologist. Refill sent.

## 2024-07-14 NOTE — Progress Notes (Signed)
   07/14/2024 Name: [name] MRN: [id number] DOB: 22-Jul-1946  Subjective  Chief Complaint  Patient presents with   Medication Access    Care Team: Primary Care Provider: Rilla Baller, MD  Reason for visit: ?  [name] is a 78 y.o. male who presents today for a telephone visit with the pharmacist for medication access/Evaluation of eligibility for Breztri  PAP.   Medication Access: ?  Breztri  inhaler - brand medication and therefore is expensive through his insurance.  We have previously completed PAP applications for him to receive Breztri , though patient patients were never returned to clinic. Patient confirms he was previously enrolled in [place] and was receiving Breztri  in the mail in previous years.   Reports that all other medications are affordable.   Prescription drug coverage: YES Payor: Multimedia programmer / Plan: UHC MEDICARE / Product Type: *No Product type* / .   Patient lives in a household of 2 with an estimated combined monthly income of 3200   Assessment and Plan:   Medication Access Previously enrolled. 2025 re-enrollment never returned to clinic. Discussed next steps with wife/patient. They prefer to come sign the application in person at the front desk.    Patient Assistance Program (PAP) Application Manufacturer: [employer] ([place])    (Re-enrollment) Medication(s): Breztri   Patient Portion of Application:  07/14/24: Filled out and uploaded to clinic eFax folder for patient signature in front office. Plans to sign within the next 7 days  Next steps: Once patient signs, front office to place application in PCP folder for review/PCP signature THEN, CMA may fax to [employer] and upload to chart.   Forwarded to [facility] CPhT Patient Advocate Team for future correspondences/re-enrollment.  Note routed to PCP Clinic Pool to ensure PCP signature is obtained and application is faxed.    Future Appointments  Date Time Provider Department Center   07/25/2024  3:30 PM Rilla Baller, MD [facility]  08/10/2024  1:00 PM [facility]  [facility] None  08/22/2024  2:45 PM MacDiarmid, Glendia, MD [facility] None    [name], PharmD Clinical Pharmacist [facility] [phone]

## 2024-07-17 DIAGNOSIS — J449 Chronic obstructive pulmonary disease, unspecified: Secondary | ICD-10-CM | POA: Diagnosis not present

## 2024-07-20 ENCOUNTER — Telehealth: Payer: Self-pay

## 2024-07-20 NOTE — Telephone Encounter (Signed)
 Spoke with pt's wife, Donny (on dpr), notifying her we have pt's [place] PAP app ready to be signed and dated. She verbalizes understanding and will inform pt.   [Placed form at front office. Once form is signed, place in Dr Talmadge box to be faxed and scanned into chart.]

## 2024-07-20 NOTE — Progress Notes (Addendum)
 Signed and placed in CMA box.  Pt /wife will need to sign as well

## 2024-07-21 ENCOUNTER — Encounter: Payer: Self-pay | Admitting: Family Medicine

## 2024-07-21 DIAGNOSIS — G894 Chronic pain syndrome: Secondary | ICD-10-CM

## 2024-07-21 NOTE — Telephone Encounter (Signed)
 Name of Medication:  Hydrocodone -APAP Name of Pharmacy:  [facility] Last Fill or Written Date and Quantity:  05/31/24, #120 Last Office Visit and Type:  06/27/24, tremor/memory issues Next Office Visit and Type:  07/25/24, 4 wk tremor f/u Last Controlled Substance Agreement Date:  02/03/18 Last UDS:  07/06/17

## 2024-07-22 MED ORDER — HYDROCODONE-ACETAMINOPHEN 10-325 MG PO TABS: 1.0000 | ORAL_TABLET | Freq: Four times a day (QID) | ORAL | 0 refills | Status: DC | PRN

## 2024-07-22 NOTE — Telephone Encounter (Signed)
 ERx

## 2024-07-25 ENCOUNTER — Ambulatory Visit: Admitting: Family Medicine

## 2024-07-25 ENCOUNTER — Encounter: Payer: Self-pay | Admitting: Family Medicine

## 2024-07-25 VITALS — BP 110/80 | HR 53 | Temp 97.7°F | Ht 72.0 in | Wt 217.1 lb

## 2024-07-25 DIAGNOSIS — G894 Chronic pain syndrome: Secondary | ICD-10-CM | POA: Diagnosis not present

## 2024-07-25 DIAGNOSIS — Z7901 Long term (current) use of anticoagulants: Secondary | ICD-10-CM

## 2024-07-25 DIAGNOSIS — R413 Other amnesia: Secondary | ICD-10-CM

## 2024-07-25 DIAGNOSIS — I4819 Other persistent atrial fibrillation: Secondary | ICD-10-CM

## 2024-07-25 DIAGNOSIS — C61 Malignant neoplasm of prostate: Secondary | ICD-10-CM

## 2024-07-25 DIAGNOSIS — R5381 Other malaise: Secondary | ICD-10-CM

## 2024-07-25 DIAGNOSIS — G4733 Obstructive sleep apnea (adult) (pediatric): Secondary | ICD-10-CM

## 2024-07-25 DIAGNOSIS — R5383 Other fatigue: Secondary | ICD-10-CM | POA: Diagnosis not present

## 2024-07-25 DIAGNOSIS — N3941 Urge incontinence: Secondary | ICD-10-CM

## 2024-07-25 DIAGNOSIS — J9610 Chronic respiratory failure, unspecified whether with hypoxia or hypercapnia: Secondary | ICD-10-CM | POA: Diagnosis not present

## 2024-07-25 DIAGNOSIS — R251 Tremor, unspecified: Secondary | ICD-10-CM | POA: Diagnosis not present

## 2024-07-25 DIAGNOSIS — I5032 Chronic diastolic (congestive) heart failure: Secondary | ICD-10-CM

## 2024-07-25 DIAGNOSIS — J449 Chronic obstructive pulmonary disease, unspecified: Secondary | ICD-10-CM

## 2024-07-25 NOTE — Progress Notes (Unsigned)
 Ph: [phone] Fax: [phone]   Patient ID: [name], male    DOB: [birth year], 78 y.o.   MRN: [id number]  This visit was conducted in person.  BP 110/80 (BP Location: Right Arm, Cuff Size: Normal)   Pulse (!) 53   Temp 97.7 F (36.5 C) (Oral)   Ht 6' (1.829 m)   Wt 217 lb 2 oz (98.5 kg)   SpO2 94%   BMI 29.45 kg/m    CC: 1 mo f/u visit  Subjective:   HPI: [name] is a 78 y.o. male presenting on 07/25/2024 for Medical Management of Chronic Issues (Pt here for 4 wk f/u /Pt accompanied by wife Nathanel)   They signed [place]&MR patient forms for Breztri  inhaler.  See prior note for details.   Not feeling well over the past 3 days. Thinks he's in atrial fibrillation again. This is despite taking amiodarone  and diltiazem  x2 today.  Today at home HR 70 - 90.  Toprol  XL caused bradycardia to 40s so stopped this.  Has not taken metoprolol  tartrate yet - has to use PRN.  Lab Results  Component Value Date   INR 2.2 06/29/2024   INR 2.9 05/18/2024   INR 3.5 (A) 04/20/2024   No chest pain, tightness. Chronic exertional dyspnea, worse off oxygen  - has been using concentrator more regularly.   Memory has been doing ok.       Relevant past medical, surgical, family and social history reviewed and updated as indicated. Interim medical history since our last visit reviewed. Allergies and medications reviewed and updated. Outpatient Medications Prior to Visit  Medication Sig Dispense Refill   albuterol  (VENTOLIN  HFA) 108 (90 Base) MCG/ACT inhaler INHALE 2 PUFFS INTO THE LUNGS EVERY 6 HOURS AS NEEDED FOR WHEEZING OR SHORTNESS OF BREATH 18 g 3   amiodarone  (PACERONE ) 200 MG tablet TAKE 1 TABLET BY MOUTH AS NEEDED FOR HEART RATE> 100 60 tablet 8   aspirin  81 MG EC tablet Take 1 tablet (81 mg total) by mouth daily. Swallow whole.     Budeson-Glycopyrrol-Formoterol (BREZTRI  AEROSPHERE) 160-9-4.8 MCG/ACT AERO Inhale 2 puffs into the lungs in the morning and at bedtime. 3 each 3    Cholecalciferol  (VITAMIN D ) 50 MCG (2000 UT) CAPS Take 1 capsule (2,000 Units total) by mouth daily. 30 capsule    Coenzyme Q10 (COQ-10 PO) Take 1 tablet by mouth daily.     Cyanocobalamin  (B-12) 1000 MCG SUBL Place 1 tablet under the tongue every Monday, Wednesday, and Friday. 30 tablet    diclofenac  Sodium (VOLTAREN ) 1 % GEL Apply 4 g topically 3 (three) times daily. As needed 100 g 3   diltiazem  (CARDIZEM ) 30 MG tablet TAKE 1 TABLET(30 MG) BY MOUTH DAILY AS NEEDED FOR FAST HEART RATE 30 tablet 3   diphenoxylate -atropine  (LOMOTIL ) 2.5-0.025 MG tablet Take 1 tablet by mouth 2 (two) times daily as needed. 20 tablet 0   Ferrous Sulfate  (IRON) 325 (65 Fe) MG TABS Take by mouth daily.     fish oil-omega-3 fatty acids 1000 MG capsule Take 2 g by mouth daily.     furosemide  (LASIX ) 20 MG tablet TAKE 1 TABLET BY MOUTH EVERY DAY AS NEEDED FOR FLUID RETENTION 90 tablet 1   gabapentin  (NEURONTIN ) 600 MG tablet Take 1 tablet (600 mg total) by mouth at bedtime. 90 tablet 1   HYDROcodone -acetaminophen  (NORCO) 10-325 MG tablet Take 1 tablet by mouth every 6 (six) hours as needed for moderate pain (pain score 4-6) or  severe pain (pain score 7-10). 120 tablet 0   ipratropium-albuterol  (DUONEB) 0.5-2.5 (3) MG/3ML SOLN Take 3 mLs by nebulization every 6 (six) hours as needed. 360 mL 6   loratadine  (CLARITIN ) 10 MG tablet Take 1 tablet (10 mg total) by mouth daily.     metFORMIN  (GLUCOPHAGE -XR) 500 MG 24 hr tablet Take 1 tablet (500 mg total) by mouth 2 (two) times daily with a meal. 180 tablet 4   methocarbamol  (ROBAXIN ) 500 MG tablet TAKE 1 TABLET(500 MG) BY MOUTH EVERY 8 HOURS AS NEEDED FOR MUSCLE SPASMS 180 tablet 1   metoprolol  tartrate (LOPRESSOR ) 25 MG tablet Take 0.5 tablets (12.5 mg total) by mouth daily as needed (fast heart rate).     midodrine  (PROAMATINE ) 10 MG tablet Take 1 tablet (10 mg total) by mouth 3 (three) times daily as needed. 90 tablet 1   nitroGLYCERIN  (NITROLINGUAL ) 0.4 MG/SPRAY spray USE  1 SPRAY AS DIRECTED EVERY 5 MINUTES AS NEEDED 4.9 g 1   omeprazole  (PRILOSEC) 40 MG capsule Take 1 capsule (40 mg total) by mouth daily. 90 capsule 4   OXYGEN  Inhale 3 L into the lungs at bedtime.     pravastatin  (PRAVACHOL ) 20 MG tablet TAKE 1 TABLET(20 MG) BY MOUTH DAILY 90 tablet 4   spironolactone  (ALDACTONE ) 25 MG tablet Take 0.5 tablets (12.5 mg total) by mouth daily. 45 tablet 4   tamsulosin  (FLOMAX ) 0.4 MG CAPS capsule Take 1 capsule (0.4 mg total) by mouth daily. 30 capsule 11   trimethoprim  (TRIMPEX ) 100 MG tablet Take 1 tablet (100 mg total) by mouth daily. 30 tablet 11   warfarin (COUMADIN ) 2.5 MG tablet TAKE 1 TABLET TO 1 AND 1/2 TABLETS BY MOUTH DAILY AS DIRECTED AFTER ANTI-COAG CLINIC 120 tablet 0   metoprolol  succinate (TOPROL -XL) 25 MG 24 hr tablet Take 0.5 tablets (12.5 mg total) by mouth daily. For heart rate and tremor 45 tablet 3   No facility-administered medications prior to visit.     Per HPI unless specifically indicated in ROS section below Review of Systems  Objective:  BP 110/80 (BP Location: Right Arm, Cuff Size: Normal)   Pulse (!) 53   Temp 97.7 F (36.5 C) (Oral)   Ht 6' (1.829 m)   Wt 217 lb 2 oz (98.5 kg)   SpO2 94%   BMI 29.45 kg/m   Wt Readings from Last 3 Encounters:  07/25/24 217 lb 2 oz (98.5 kg)  06/27/24 222 lb (100.7 kg)  05/30/24 219 lb (99.3 kg)      Physical Exam Vitals and nursing note reviewed.  Constitutional:      Appearance: Normal appearance. He is ill-appearing.     Comments: Sitting in wheelchair  HENT:     Head: Normocephalic and atraumatic.     Mouth/Throat:     Mouth: Mucous membranes are moist.     Pharynx: Oropharynx is clear. No oropharyngeal exudate or posterior oropharyngeal erythema.  Eyes:     Extraocular Movements: Extraocular movements intact.     Pupils: Pupils are equal, round, and reactive to light.  Cardiovascular:     Rate and Rhythm: Normal rate. Rhythm irregularly irregular.     Pulses: Normal  pulses.     Heart sounds: No murmur heard. Pulmonary:     Effort: Pulmonary effort is normal. No respiratory distress.     Breath sounds: Normal breath sounds. No wheezing, rhonchi or rales.  Musculoskeletal:     Right lower leg: No edema.     Left lower  leg: No edema.  Skin:    General: Skin is warm and dry.     Findings: No rash.  Neurological:     Mental Status: He is alert.  Psychiatric:        Behavior: Behavior normal.       Results for orders placed or performed in visit on 06/29/24  POCT INR   Collection Time: 06/29/24 11:15 AM  Result Value Ref Range   INR 2.2 2.0 - 3.0   POC INR     *Note: Due to a large number of results and/or encounters for the requested time period, some results have not been displayed. A complete set of results can be found in Results Review.   Lab Results  Component Value Date   WBC 7.8 12/18/2023   HGB 12.1 (L) 12/18/2023   HCT 37.5 (L) 12/18/2023   MCV 95.8 12/18/2023   PLT 259.0 12/18/2023    Lab Results  Component Value Date   NA 140 12/18/2023   CL 102 12/18/2023   K 4.4 12/18/2023   CO2 27 12/18/2023   BUN 15 12/18/2023   CREATININE 1.21 12/18/2023   GFR 56.90 (L) 12/18/2023   CALCIUM  8.7 12/18/2023   PHOS 3.7 11/03/2018   ALBUMIN 3.5 09/15/2023   GLUCOSE 236 (H) 12/18/2023    Lab Results  Component Value Date   HGBA1C 6.8 (A) 06/27/2024    Assessment & Plan:   Problem List Items Addressed This Visit   None    No orders of the defined types were placed in this encounter.   No orders of the defined types were placed in this encounter.   Patient Instructions  I recommend more regular oxygen  use during the day- take portable oxygen  concentrator with you when you leave the house.  Continue lower dose gabapentin  600mg  nightly.  Continue amiodarone  200mg  daily while in atrial fibrillation.  May use diltiazem  and metoprolol  as needed for fast heart rates.  Return in 2 months for physical/wellness visit  Follow up  plan: Return in about 2 months (around 09/24/2024) for annual exam, prior fasting for blood work, medicare wellness visit.  Anton Blas, MD

## 2024-07-25 NOTE — Patient Instructions (Addendum)
 I recommend more regular oxygen  use during the day- take portable oxygen  concentrator with you when you leave the house.  Continue lower dose gabapentin  600mg  nightly.  Continue amiodarone  200mg  daily while in atrial fibrillation.  May use diltiazem  and metoprolol  as needed for fast heart rates.  Return in 2 months for physical/wellness visit

## 2024-07-28 ENCOUNTER — Encounter: Payer: Self-pay | Admitting: Family Medicine

## 2024-07-28 NOTE — Assessment & Plan Note (Signed)
 Presented with hypoxia, improves on 2L suppl O2 by [facility]. Likely multifactorial - COPD, untreated OSA, CHF.  Rec more regular daytime O2 use.

## 2024-07-28 NOTE — Assessment & Plan Note (Signed)
 Chronic COPD with hypoxia.  In process of PAP application for Breztri .  He already uses nocturnal supplemental oxygen . With increased hypoxia, recommend he start daytime oxygen  as well.

## 2024-07-28 NOTE — Assessment & Plan Note (Signed)
 Latest INR stable.

## 2024-07-28 NOTE — Assessment & Plan Note (Addendum)
 Malaise may be due to more recently persistent afib.  Discussed amiodarone  dosing - recommend take 200mg  daily while in afib, may also use diltiazem  /metoprolol  as needed for rapid heart rate.  Consider digoxin  if hypotension develops with above.

## 2024-07-28 NOTE — Assessment & Plan Note (Signed)
 Continue robaxin , hydrocodone .

## 2024-07-28 NOTE — Assessment & Plan Note (Signed)
 CPAP intolerant. Continue nocturnal O2 use.

## 2024-07-28 NOTE — Assessment & Plan Note (Signed)
 Treated 2019 with radiation therapy.  Released from rad onc care ([facility]) 08/2023 - rec yearly PSA and uro f/u if trending up.

## 2024-07-28 NOTE — Assessment & Plan Note (Signed)
 Ongoing.  Toprol  XL caused bradycardia pulse 40s so now off this. Consider propranolol trial. Will continue PRN metoprolol  tartrate 12.5mg  PRN.

## 2024-07-28 NOTE — Assessment & Plan Note (Addendum)
 Possible improvement in memory  on lower gabapentin  dose (600mg  nightly) - continue this.

## 2024-07-28 NOTE — Telephone Encounter (Signed)
 Form faxed and sent to scan in patient chart.

## 2024-08-10 ENCOUNTER — Ambulatory Visit: Attending: Cardiovascular Disease

## 2024-08-10 DIAGNOSIS — Z5181 Encounter for therapeutic drug level monitoring: Secondary | ICD-10-CM

## 2024-08-10 DIAGNOSIS — I4891 Unspecified atrial fibrillation: Secondary | ICD-10-CM

## 2024-08-10 LAB — POCT INR: INR: 1.8 — AB (ref 2.0–3.0)

## 2024-08-10 NOTE — Patient Instructions (Signed)
 Take 2 tablets today only then continue 1 tablet daily, EXCEPT 1.5 TABLETS ON Mondays, Wednesdays and Fridays Recheck INR in 6 weeks [phone]

## 2024-08-22 ENCOUNTER — Ambulatory Visit: Admitting: Urology

## 2024-08-22 ENCOUNTER — Telehealth: Payer: Self-pay | Admitting: Family Medicine

## 2024-08-22 VITALS — BP 136/68 | HR 54

## 2024-08-22 DIAGNOSIS — R32 Unspecified urinary incontinence: Secondary | ICD-10-CM

## 2024-08-22 DIAGNOSIS — I951 Orthostatic hypotension: Secondary | ICD-10-CM

## 2024-08-22 DIAGNOSIS — N3941 Urge incontinence: Secondary | ICD-10-CM

## 2024-08-22 LAB — BLADDER SCAN AMB NON-IMAGING
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
## 2024-08-22 NOTE — Telephone Encounter (Signed)
 Noted orthostatic  hypotension on recent home visit - see problem ilst

## 2024-08-29 DIAGNOSIS — L821 Other seborrheic keratosis: Secondary | ICD-10-CM | POA: Diagnosis not present

## 2024-08-29 DIAGNOSIS — L570 Actinic keratosis: Secondary | ICD-10-CM | POA: Diagnosis not present

## 2024-08-29 DIAGNOSIS — C44319 Basal cell carcinoma of skin of other parts of face: Secondary | ICD-10-CM | POA: Diagnosis not present

## 2024-09-01 ENCOUNTER — Encounter: Payer: Self-pay | Admitting: Family Medicine

## 2024-09-01 DIAGNOSIS — G894 Chronic pain syndrome: Secondary | ICD-10-CM

## 2024-09-01 MED ORDER — HYDROCODONE-ACETAMINOPHEN 10-325 MG PO TABS: 1.0000 | ORAL_TABLET | Freq: Four times a day (QID) | ORAL | 0 refills | Status: DC | PRN

## 2024-09-01 NOTE — Telephone Encounter (Signed)
 ERx

## 2024-09-01 NOTE — Telephone Encounter (Signed)
 Name of Medication: Hydrocodone  Name of Pharmacy: [facility]  Last Fill or Written Date and Quantity: 07/22/24 #150 Last Office Visit and Type: 07/25/24 Next Office Visit and Type: 09/27/24

## 2024-09-06 ENCOUNTER — Encounter: Payer: Self-pay | Admitting: Family Medicine

## 2024-09-09 DIAGNOSIS — J449 Chronic obstructive pulmonary disease, unspecified: Secondary | ICD-10-CM | POA: Diagnosis not present

## 2024-09-12 ENCOUNTER — Ambulatory Visit: Payer: Self-pay

## 2024-09-12 ENCOUNTER — Telehealth: Payer: Self-pay

## 2024-09-12 ENCOUNTER — Other Ambulatory Visit: Payer: Self-pay | Admitting: Family Medicine

## 2024-09-12 DIAGNOSIS — G6289 Other specified polyneuropathies: Secondary | ICD-10-CM

## 2024-09-12 MED ORDER — GABAPENTIN 600 MG PO TABS
600.0000 mg | ORAL_TABLET | Freq: Two times a day (BID) | ORAL | 1 refills | Status: AC
Start: 2024-09-12 — End: ?

## 2024-09-12 NOTE — Telephone Encounter (Signed)
 FYI Only or Action Required?: Action required by provider: Patient asking for new order for Gabapentin . Pt has been taking extra duing the day to help with the hand pain and is now completely out.  Patient was last seen in primary care on 07/25/2024 by Rilla Baller, MD.  Called Nurse Triage reporting Hand Pain.  Symptoms began several weeks ago.  Interventions attempted: OTC medications: Muscle ointment and Prescription medications: Gabapentin .  Symptoms are: gradually worsening.  Triage Disposition: See PCP Within 2 Weeks  Patient/caregiver understands and will follow disposition?: Yes  Copied from CRM [id number]. Topic: Clinical - Red Word Triage >> Sep 12, 2024  3:42 PM Alfonso HERO wrote: Patient experiencing pain in his right hand. Reason for Disposition  [1] MILD pain (e.g., does not interfere with normal activities) AND [2] present > 7 days  Answer Assessment - Initial Assessment Questions 1. ONSET: When did the pain start?     Approx 1 month ago  2. LOCATION: Where is the pain located?     Right hand  3. PAIN: How bad is the pain? (Scale 1-10; or mild, moderate, severe)     Mild to moderate  4. WORK OR EXERCISE: Has there been any recent work or exercise that involved this part (i.e., hand or wrist) of the body?     *No Answer* 5. CAUSE: What do you think is causing the pain?     Unsure of cause  6. AGGRAVATING FACTORS: What makes the pain worse? (e.g., using computer)     Unsure of cause 7. OTHER SYMPTOMS: Do you have any other symptoms? (e.g., fever, neck pain, numbness or tingling, rash, swelling)     Tremors  Wife expressed some concerns for mood changes and insomnia over the past several months  Protocols used: Hand Pain-A-AH

## 2024-09-12 NOTE — Telephone Encounter (Signed)
 [name]

## 2024-09-12 NOTE — Telephone Encounter (Signed)
 Gabapentin  refilled 600mg  BID PRN - which is an increase from prior.  Sent to [facility]. How is he currently taking gabapentin ?

## 2024-09-13 NOTE — Telephone Encounter (Signed)
 Spoke with pt's wife, Donny (on dpr), relaying Dr Talmadge message and asking how pt takes gabapentin . States pt usually take 600 mg daily but she gives him an extra tab in the evenings when he c/o severe hand pain. Says pt was going to discuss with Dr KANDICE at 09/26/24 OV.

## 2024-09-15 ENCOUNTER — Other Ambulatory Visit: Payer: Self-pay | Admitting: Family Medicine

## 2024-09-16 DIAGNOSIS — J449 Chronic obstructive pulmonary disease, unspecified: Secondary | ICD-10-CM | POA: Diagnosis not present

## 2024-09-21 ENCOUNTER — Ambulatory Visit: Attending: Cardiovascular Disease

## 2024-09-21 DIAGNOSIS — Z5181 Encounter for therapeutic drug level monitoring: Secondary | ICD-10-CM | POA: Diagnosis not present

## 2024-09-21 DIAGNOSIS — I4891 Unspecified atrial fibrillation: Secondary | ICD-10-CM | POA: Diagnosis not present

## 2024-09-21 LAB — POCT INR: INR: 1.8 — AB (ref 2.0–3.0)

## 2024-09-21 NOTE — Patient Instructions (Signed)
 Take 2.5 tablets today only then continue 1 tablet daily, EXCEPT 1.5 TABLETS ON Mondays, Wednesdays and Fridays Recheck INR in 6 weeks [phone]

## 2024-09-26 ENCOUNTER — Encounter: Payer: Self-pay | Admitting: Family Medicine

## 2024-09-26 ENCOUNTER — Ambulatory Visit (INDEPENDENT_AMBULATORY_CARE_PROVIDER_SITE_OTHER): Admitting: Family Medicine

## 2024-09-26 VITALS — BP 110/78 | HR 55 | Temp 97.7°F | Ht 72.0 in

## 2024-09-26 DIAGNOSIS — G8929 Other chronic pain: Secondary | ICD-10-CM | POA: Diagnosis not present

## 2024-09-26 DIAGNOSIS — J9610 Chronic respiratory failure, unspecified whether with hypoxia or hypercapnia: Secondary | ICD-10-CM | POA: Diagnosis not present

## 2024-09-26 DIAGNOSIS — M25531 Pain in right wrist: Secondary | ICD-10-CM

## 2024-09-26 DIAGNOSIS — M79641 Pain in right hand: Secondary | ICD-10-CM

## 2024-09-26 DIAGNOSIS — R251 Tremor, unspecified: Secondary | ICD-10-CM | POA: Diagnosis not present

## 2024-09-26 MED ORDER — PREDNISONE 20 MG PO TABS: ORAL_TABLET | ORAL | 0 refills | Status: DC

## 2024-09-26 NOTE — Patient Instructions (Addendum)
 Prednisone  taper for right hand pain.  Continue gabapentin , hydrocodone , methocarbamol  and topical cream.  Labs today  Return in 6 weeks for physical.

## 2024-09-26 NOTE — Progress Notes (Unsigned)
 Ph: [phone] Fax: [phone]   Patient ID: [name], male    DOB: [birth year], 78 y.o.   MRN: [id number]  This visit was conducted in person.  BP 110/78   Pulse (!) 55   Temp 97.7 F (36.5 C) (Oral)   Ht 6' (1.829 m)   SpO2 92%   BMI 29.45 kg/m    CC: hand pain  Subjective:   HPI: [name] is a 78 y.o. male presenting on 09/26/2024 for Hand Pain (Pt CC is Hand pain, and tremors. Pt states hand aches all the time. No home treatment./Started:2-3 months ago/Not Due to injury. Pain scale is a 7)   R handed.  6+ months of right hand pain without inciting trauma/injury or falls, described as constant dull ache.  Has been taking gabapentin  600mg  bid.  Also using hydrocodone  10/325mg  ~2 tablets bid.  He also has tried equate topical cream from [facility] - with limited relief.  He uses glove to both hands with limited benefit.  Hands and feet stay cold.  + paresthesias and numbness to right hand and to both feet.  No neck pain or radiculopathy from neck down right arm.  No new rash.   Notes anosmia for years.  Ambulates with cane.  Denies significant muscle/joint stiffness or memory concerns.   Chronic LBP - ambulates with brace otherwise very painful.   Ongoing tremors predominantly to right hand - gabapentin  hasn't been too helpful for this.  Dizziness overall improved.   Hypoxia - he is more regular with daytime oxygen . Also more regular with Breztri  inhaler daily. He does use night time oxygen  consistently. Breathing status overall stable.   Lab Results  Component Value Date   NA 140 12/18/2023   CL 102 12/18/2023   K 4.4 12/18/2023   CO2 27 12/18/2023   BUN 15 12/18/2023   CREATININE 1.21 12/18/2023   GFR 56.90 (L) 12/18/2023   CALCIUM  8.7 12/18/2023   PHOS 3.7 11/03/2018   ALBUMIN 3.5 09/15/2023   GLUCOSE 236 (H) 12/18/2023    Lab Results  Component Value Date   HGBA1C 6.8 (A) 06/27/2024        Relevant past medical, surgical, family and  social history reviewed and updated as indicated. Interim medical history since our last visit reviewed. Allergies and medications reviewed and updated. Outpatient Medications Prior to Visit  Medication Sig Dispense Refill   albuterol  (VENTOLIN  HFA) 108 (90 Base) MCG/ACT inhaler INHALE 2 PUFFS INTO THE LUNGS EVERY 6 HOURS AS NEEDED FOR WHEEZING OR SHORTNESS OF BREATH 18 g 3   amiodarone  (PACERONE ) 200 MG tablet TAKE 1 TABLET BY MOUTH AS NEEDED FOR HEART RATE> 100 60 tablet 8   aspirin  81 MG EC tablet Take 1 tablet (81 mg total) by mouth daily. Swallow whole.     Budeson-Glycopyrrol-Formoterol (BREZTRI  AEROSPHERE) 160-9-4.8 MCG/ACT AERO Inhale 2 puffs into the lungs in the morning and at bedtime. 3 each 3   Cholecalciferol  (VITAMIN D ) 50 MCG (2000 UT) CAPS Take 1 capsule (2,000 Units total) by mouth daily. 30 capsule    Coenzyme Q10 (COQ-10 PO) Take 1 tablet by mouth daily.     Cyanocobalamin  (B-12) 1000 MCG SUBL Place 1 tablet under the tongue every Monday, Wednesday, and Friday. 30 tablet    diclofenac  Sodium (VOLTAREN ) 1 % GEL Apply 4 g topically 3 (three) times daily. As needed 100 g 3   diltiazem  (CARDIZEM ) 30 MG tablet TAKE 1 TABLET(30 MG) BY MOUTH DAILY AS NEEDED FOR  FAST HEART RATE 30 tablet 3   diphenoxylate -atropine  (LOMOTIL ) 2.5-0.025 MG tablet Take 1 tablet by mouth 2 (two) times daily as needed. 20 tablet 0   Ferrous Sulfate  (IRON) 325 (65 Fe) MG TABS Take by mouth daily.     fish oil-omega-3 fatty acids 1000 MG capsule Take 2 g by mouth daily.     furosemide  (LASIX ) 20 MG tablet TAKE 1 TABLET BY MOUTH EVERY DAY AS NEEDED FOR FLUID RETENTION 90 tablet 1   gabapentin  (NEURONTIN ) 600 MG tablet Take 1 tablet (600 mg total) by mouth 2 (two) times daily. 180 tablet 1   HYDROcodone -acetaminophen  (NORCO) 10-325 MG tablet Take 1 tablet by mouth every 6 (six) hours as needed for moderate pain (pain score 4-6) or severe pain (pain score 7-10). 120 tablet 0   ipratropium-albuterol  (DUONEB)  0.5-2.5 (3) MG/3ML SOLN Take 3 mLs by nebulization every 6 (six) hours as needed. 360 mL 6   loratadine  (CLARITIN ) 10 MG tablet Take 1 tablet (10 mg total) by mouth daily.     metFORMIN  (GLUCOPHAGE -XR) 500 MG 24 hr tablet Take 1 tablet (500 mg total) by mouth 2 (two) times daily with a meal. 180 tablet 4   methocarbamol  (ROBAXIN ) 500 MG tablet TAKE 1 TABLET(500 MG) BY MOUTH EVERY 8 HOURS AS NEEDED FOR MUSCLE SPASMS 180 tablet 1   metoprolol  tartrate (LOPRESSOR ) 25 MG tablet Take 0.5 tablets (12.5 mg total) by mouth daily as needed (fast heart rate).     midodrine  (PROAMATINE ) 10 MG tablet Take 1 tablet (10 mg total) by mouth 3 (three) times daily as needed. 90 tablet 1   nitroGLYCERIN  (NITROLINGUAL ) 0.4 MG/SPRAY spray USE 1 SPRAY AS DIRECTED EVERY 5 MINUTES AS NEEDED 4.9 g 1   omeprazole  (PRILOSEC) 40 MG capsule Take 1 capsule (40 mg total) by mouth daily. 90 capsule 4   OXYGEN  Inhale 3 L into the lungs at bedtime.     pravastatin  (PRAVACHOL ) 20 MG tablet TAKE 1 TABLET(20 MG) BY MOUTH DAILY 90 tablet 4   spironolactone  (ALDACTONE ) 25 MG tablet Take 0.5 tablets (12.5 mg total) by mouth daily. 45 tablet 4   tamsulosin  (FLOMAX ) 0.4 MG CAPS capsule Take 1 capsule (0.4 mg total) by mouth daily. 30 capsule 11   trimethoprim  (TRIMPEX ) 100 MG tablet Take 1 tablet (100 mg total) by mouth daily. 30 tablet 11   warfarin (COUMADIN ) 2.5 MG tablet TAKE 1 TABLET TO 1 AND 1/2 TABLETS BY MOUTH DAILY AS DIRECTED AFTER ANTI-COAG CLINIC 120 tablet 0   No facility-administered medications prior to visit.     Per HPI unless specifically indicated in ROS section below Review of Systems  Objective:  BP 110/78   Pulse (!) 55   Temp 97.7 F (36.5 C) (Oral)   Ht 6' (1.829 m)   SpO2 92%   BMI 29.45 kg/m   Wt Readings from Last 3 Encounters:  07/25/24 217 lb 2 oz (98.5 kg)  06/27/24 222 lb (100.7 kg)  05/30/24 219 lb (99.3 kg)      Physical Exam Vitals and nursing note reviewed.  Constitutional:       Appearance: Normal appearance. He is not ill-appearing.  Musculoskeletal:        General: Tenderness present. No swelling.     Right lower leg: No edema.     Left lower leg: No edema.     Comments:  2+ rad pulses  Sensation intact Tender to palpation dorsal right hand and wrist, however with full ROM at wrist  without pain with wrist movement  Grip strength overall intact No significant swelling warmth or redness, or pain to palpation at finger/MCP joints  No pain at scaphoid, no pain at 1st [facility] No pain to palpation of TFCC Mild discomfort to palpation of R forearm extensors   Skin:    General: Skin is warm and dry.     Capillary Refill: Capillary refill takes less than 2 seconds.     Findings: No erythema or rash.  Neurological:     Mental Status: He is alert.  Psychiatric:        Mood and Affect: Mood normal.        Behavior: Behavior normal.       Results for orders placed or performed in visit on 09/21/24  POCT INR   Collection Time: 09/21/24  2:13 PM  Result Value Ref Range   INR 1.8 (A) 2.0 - 3.0   POC INR     *Note: Due to a large number of results and/or encounters for the requested time period, some results have not been displayed. A complete set of results can be found in Results Review.   RIGHT FOREARM - 2 VIEW COMPARISON:  Right forearm 12/09/2016. FINDINGS: Degenerative changes noted about the right wrist and elbow. No acute bony abnormality identified. No evidence of fracture or dislocation. Peripheral vascular calcification cannot be excluded. IMPRESSION: 1. Degenerative changes noted about the right wrist and elbow. No acute bony abnormality identified. 2. Peripheral vascular disease cannot be excluded.  Electronically Signed   By: Debby  Register M.D.   On: 09/16/2022 07:45  RIGHT HAND - COMPLETE 3+ VIEW COMPARISON:  None Available. FINDINGS: Tiny osseous fragment about the radial/dorsal base of the index finger middle phalanx may be a small  fracture fragment. Mild swelling about the index finger. Degenerative arthritis first CMC joint. IMPRESSION: Tiny osseous fragment about the radial/dorsal base of the index finger middle phalanx may be a small fracture fragment. Mild swelling about the index finger  Electronically Signed   By: Norman Gatlin M.D.   On: 11/05/2022 18:44  Assessment & Plan:   Problem List Items Addressed This Visit     Chronic respiratory failure (HCC)   Breathing status is stable with more regular use of home oxygen  - both daytime and nocturnal. Dizziness is also improved.       Tremor   Ongoing difficulty with this, gabapentin  hasn't really helped.  Was unable to tolerate toprol  XL due to bradycardia- consider low dose propranolol Consider primodone Consider neurology eval r/o PD.       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  Denies neck symptoms (cervical radiculopathy).  Known h/o PVD.  Notes mild temporary relief with meds tried to date hydrocodone /apap, gabapentin , methocarbamol  use. Also uses topical pain creams regularly with limited benefit. Doesn't describe neuropathic pain.  Previous plain film imaging of R forearm and hand unrevealing besides DJD (09/2022, 10/2022) - question of small middle phalax fracture - however he doesn't have pain at this location. ?osteoarthritis vs gout - update labwork today including inflammatory markers, uric acid, muscle function (in statin use), and CBC/CMP. Treat with prednisone  taper in interim - reviewed steroid precautions.  Consider advanced imaging (CT) vs hand surgery referral pending lab results.       Relevant Medications   predniSONE  (DELTASONE ) 20 MG tablet   Other Relevant Orders   CBC with Differential/Platelet  Uric acid   Sedimentation rate   C-reactive protein   CK   Comprehensive metabolic panel with GFR   TSH     Meds ordered this encounter  Medications    predniSONE  (DELTASONE ) 20 MG tablet    Sig: Take two tablets daily for 3 days followed by one tablet daily for 4 days    Dispense:  10 tablet    Refill:  0    Orders Placed This Encounter  Procedures   CBC with Differential/Platelet   Uric acid   Sedimentation rate   C-reactive protein   CK   Comprehensive metabolic panel with GFR   TSH    Patient Instructions  Prednisone  taper for right hand pain.  Continue gabapentin , hydrocodone , methocarbamol  and topical cream.  Labs today  Return in 6 weeks for physical.   Follow up plan: Return in about 6 weeks (around 11/07/2024).  Anton Blas, MD

## 2024-09-27 ENCOUNTER — Ambulatory Visit (INDEPENDENT_AMBULATORY_CARE_PROVIDER_SITE_OTHER)

## 2024-09-27 ENCOUNTER — Encounter: Payer: Self-pay | Admitting: Family Medicine

## 2024-09-27 VITALS — Ht 72.0 in | Wt 203.0 lb

## 2024-09-27 DIAGNOSIS — Z0000 Encounter for general adult medical examination without abnormal findings: Secondary | ICD-10-CM

## 2024-09-27 LAB — CBC WITH DIFFERENTIAL/PLATELET
Basophils Absolute: 0 K/uL (ref 0.0–0.1)
Basophils Relative: 0.6 % (ref 0.0–3.0)
Eosinophils Absolute: 0.1 K/uL (ref 0.0–0.7)
Eosinophils Relative: 2.2 % (ref 0.0–5.0)
HCT: 32.2 % — ABNORMAL LOW (ref 39.0–52.0)
Hemoglobin: 10.3 g/dL — ABNORMAL LOW (ref 13.0–17.0)
Lymphocytes Relative: 21.3 % (ref 12.0–46.0)
Lymphs Abs: 1.2 K/uL (ref 0.7–4.0)
MCHC: 32.1 g/dL (ref 30.0–36.0)
MCV: 89.6 fl (ref 78.0–100.0)
Monocytes Absolute: 0.4 K/uL (ref 0.1–1.0)
Monocytes Relative: 6.4 % (ref 3.0–12.0)
Neutro Abs: 3.8 K/uL (ref 1.4–7.7)
Neutrophils Relative %: 69.5 % (ref 43.0–77.0)
Platelets: 222 K/uL (ref 150.0–400.0)
RBC: 3.59 Mil/uL — ABNORMAL LOW (ref 4.22–5.81)
RDW: 15.6 % — ABNORMAL HIGH (ref 11.5–15.5)
WBC: 5.4 K/uL (ref 4.0–10.5)

## 2024-09-27 LAB — COMPREHENSIVE METABOLIC PANEL WITH GFR
ALT: 11 U/L (ref 0–53)
AST: 14 U/L (ref 0–37)
Albumin: 3.7 g/dL (ref 3.5–5.2)
Alkaline Phosphatase: 76 U/L (ref 39–117)
BUN: 17 mg/dL (ref 6–23)
CO2: 27 meq/L (ref 19–32)
Calcium: 8.4 mg/dL (ref 8.4–10.5)
Chloride: 103 meq/L (ref 96–112)
Creatinine, Ser: 1.11 mg/dL (ref 0.40–1.50)
GFR: 62.76 mL/min (ref >60.00)
Glucose, Bld: 195 mg/dL — ABNORMAL HIGH (ref 70–99)
Potassium: 4.3 meq/L (ref 3.5–5.1)
Sodium: 140 meq/L (ref 135–145)
Total Bilirubin: 0.4 mg/dL (ref 0.2–1.2)
Total Protein: 6.3 g/dL (ref 6.0–8.3)

## 2024-09-27 LAB — SEDIMENTATION RATE: Sed Rate: 37 mm/h — ABNORMAL HIGH (ref 0–20)

## 2024-09-27 LAB — CK: Total CK: 65 U/L (ref 17–232)

## 2024-09-27 LAB — TSH: TSH: 4.03 u[IU]/mL (ref 0.35–5.50)

## 2024-09-27 LAB — URIC ACID: Uric Acid, Serum: 5.9 mg/dL (ref 4.0–7.8)

## 2024-09-27 LAB — C-REACTIVE PROTEIN: CRP: 1 mg/dL (ref 0.5–20.0)

## 2024-09-27 NOTE — Assessment & Plan Note (Signed)
 Ongoing difficulty with this, gabapentin  hasn't really helped.  Was unable to tolerate toprol  XL due to bradycardia- consider low dose propranolol Consider primodone Consider neurology eval r/o PD.

## 2024-09-27 NOTE — Patient Instructions (Signed)
 [name],  Thank you for taking the time for your Medicare Wellness Visit. I appreciate your continued commitment to your health goals. Please review the care plan we discussed, and feel free to reach out if I can assist you further.  Medicare recommends these wellness visits once per year to help you and your care team stay ahead of potential health issues. These visits are designed to focus on prevention, allowing your provider to concentrate on managing your acute and chronic conditions during your regular appointments.  Please note that Annual Wellness Visits do not include a physical exam. Some assessments may be limited, especially if the visit was conducted virtually. If needed, we may recommend a separate in-person follow-up with your provider.  Ongoing Care Seeing your primary care provider every 3 to 6 months helps us  monitor your health and provide consistent, personalized care.   Referrals If a referral was made during today's visit and you haven't received any updates within two weeks, please contact the referred provider directly to check on the status.  Recommended Screenings:  Health Maintenance  Topic Date Due   Zoster (Shingles) Vaccine (1 of 2) Never done   Yearly kidney health urinalysis for diabetes  02/26/2013   Screening for Lung Cancer  01/22/2019   Eye exam for diabetics  12/29/2020   Complete foot exam   01/19/2021   Flu Shot  Never done   COVID-19 Vaccine (4 - 2025-26 season) 08/08/2024   Hemoglobin A1C  12/28/2024   Yearly kidney function blood test for diabetes  09/26/2025   Medicare Annual Wellness Visit  09/27/2025   DTaP/Tdap/Td vaccine (2 - Tdap) 04/30/2030   Pneumococcal Vaccine for age over 66  Completed   Meningitis B Vaccine  Aged Out   Hepatitis C Screening  Discontinued       08/26/2023    9:58 AM  Advanced Directives  Does Patient Have a Medical Advance Directive? No  Would patient like information on creating a medical advance directive?  No - Patient declined   Advance Care Planning is important because it: Ensures you receive medical care that aligns with your values, goals, and preferences. Provides guidance to your family and loved ones, reducing the emotional burden of decision-making during critical moments.  Vision: Annual vision screenings are recommended for early detection of glaucoma, cataracts, and diabetic retinopathy. These exams can also reveal signs of chronic conditions such as diabetes and high blood pressure.  Dental: Annual dental screenings help detect early signs of oral cancer, gum disease, and other conditions linked to overall health, including heart disease and diabetes.
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
## 2024-09-27 NOTE — Assessment & Plan Note (Addendum)
 Chronic issue to right dorsal wrist, present since at least last year.  Denies neck symptoms (cervical radiculopathy).  Known h/o PVD.  Notes mild temporary relief with meds tried to date hydrocodone /apap, gabapentin , methocarbamol  use. Also uses topical pain creams regularly with limited benefit. Doesn't describe neuropathic pain.  Previous plain film imaging of R forearm and hand unrevealing besides DJD (09/2022, 10/2022) - question of small middle phalax fracture - however he doesn't have pain at this location. ?osteoarthritis vs gout - update labwork today including inflammatory markers, uric acid, muscle function (in statin use), and CBC/CMP. Treat with prednisone  taper in interim - reviewed steroid precautions.  Consider advanced imaging (CT) vs hand surgery referral pending lab results.

## 2024-09-27 NOTE — Assessment & Plan Note (Addendum)
 Breathing status is stable with more regular use of home oxygen  - both daytime and nocturnal. Dizziness is also improved.

## 2024-09-30 ENCOUNTER — Ambulatory Visit: Payer: Self-pay | Admitting: Family Medicine

## 2024-09-30 DIAGNOSIS — D649 Anemia, unspecified: Secondary | ICD-10-CM

## 2024-10-04 ENCOUNTER — Other Ambulatory Visit: Payer: Self-pay | Admitting: Family Medicine

## 2024-10-04 DIAGNOSIS — I10 Essential (primary) hypertension: Secondary | ICD-10-CM

## 2024-10-07 ENCOUNTER — Encounter: Payer: Self-pay | Admitting: Medical

## 2024-10-07 ENCOUNTER — Telehealth: Payer: Self-pay | Admitting: Family Medicine

## 2024-10-07 ENCOUNTER — Ambulatory Visit: Attending: Medical | Admitting: Medical

## 2024-10-07 VITALS — BP 80/50 | HR 62 | Ht 72.0 in | Wt 223.0 lb

## 2024-10-07 DIAGNOSIS — I959 Hypotension, unspecified: Secondary | ICD-10-CM | POA: Diagnosis not present

## 2024-10-07 DIAGNOSIS — I10 Essential (primary) hypertension: Secondary | ICD-10-CM

## 2024-10-07 DIAGNOSIS — I5032 Chronic diastolic (congestive) heart failure: Secondary | ICD-10-CM

## 2024-10-07 DIAGNOSIS — R0602 Shortness of breath: Secondary | ICD-10-CM

## 2024-10-07 DIAGNOSIS — I714 Abdominal aortic aneurysm, without rupture, unspecified: Secondary | ICD-10-CM

## 2024-10-07 DIAGNOSIS — R55 Syncope and collapse: Secondary | ICD-10-CM

## 2024-10-07 DIAGNOSIS — I480 Paroxysmal atrial fibrillation: Secondary | ICD-10-CM

## 2024-10-07 DIAGNOSIS — J449 Chronic obstructive pulmonary disease, unspecified: Secondary | ICD-10-CM

## 2024-10-07 DIAGNOSIS — N3941 Urge incontinence: Secondary | ICD-10-CM

## 2024-10-07 DIAGNOSIS — I495 Sick sinus syndrome: Secondary | ICD-10-CM

## 2024-10-07 DIAGNOSIS — E782 Mixed hyperlipidemia: Secondary | ICD-10-CM

## 2024-10-07 DIAGNOSIS — Z720 Tobacco use: Secondary | ICD-10-CM

## 2024-10-07 NOTE — Telephone Encounter (Signed)
 Patient arrived in office today and picked up Urine cup and stool card per Dr KANDICE message.  Lab orders are not in for UA  please place order.

## 2024-10-07 NOTE — Patient Instructions (Signed)
 Medication Instructions:  Your physician recommends that you continue on your current medications as directed. Please refer to the Current Medication list given to you today.    *If you need a refill on your cardiac medications before your next appointment, please call your pharmacy*  Lab Work: No labs ordered today    Testing/Procedures: Your physician has requested that you have an echocardiogram. Echocardiography is a painless test that uses sound waves to create images of your heart. It provides your doctor with information about the size and shape of your heart and how well your heart's chambers and valves are working.   You may receive an ultrasound enhancing agent through an IV if needed to better visualize your heart during the echo. This procedure takes approximately one hour.  There are no restrictions for this procedure.  This will take place at [address] [facility] [address], [place] [address]  Please note: We ask at that you not bring children with you during ultrasound (echo/ vascular) testing. Due to room size and safety concerns, children are not allowed in the ultrasound rooms during exams. Our front office staff cannot provide observation of children in our lobby area while testing is being conducted. An adult accompanying a patient to their appointment will only be allowed in the ultrasound room at the discretion of the ultrasound technician under special circumstances. We apologize for any inconvenience.   Follow-Up: At [facility], you and your health needs are our priority.  As part of our continuing mission to provide you with exceptional heart care, our providers are all part of one team.  This team includes your primary Cardiologist (physician) and Advanced Practice Providers or APPs (Physician Assistants and Nurse Practitioners) who all work together to provide you with the care you need, when you need it.  Your next appointment:   3  month(s)  Provider:   Mikey Fishman, PA-C

## 2024-10-07 NOTE — Progress Notes (Signed)
 Cardiology Office Note   Date:  10/10/2024  ID:  Eugine, Bubb 11-03-1946, MRN [id number] PCP: Rilla Baller, MD  [facility] Providers Cardiologist:  None   History of Present Illness KENTREL CLEVENGER is a 78 y.o. male with a hx of COPD, obesity, poorly controlled diabetes, smoking history, chronic HFpEF, paroxysmal A-fib, on occasion nonadherence, prior back injury, AAA managed at [facility], chronotropic incompetence who presents for 33-month follow-up.   Echo in 2012 showed EF 55 to 65%, moderate LVH, grade 1 diastolic dysfunction, mildly dilated left atrium.  Prior stress test in 2012 with report unable to review.  Patient has been previously evaluated by EP for chronotropic incompetence, tachybradycardia syndrome with the patient refusing pacemaker implantation.  Patient has a long history of taking medications at his own discretion and does not allow for directions of his prescriptions. Echo November 2021 showed EF of 50%, moderate LVH, aortic root 41 mm ascending aorta 37 mm.  Patient was seen in August 2023 and it was recommended he stop his amiodarone  given his asymptomatic A-fib.  Patient was last seen 01/18/2024 and was stable from a cardiac perspective.  He was using midodrine  as needed for hypotension/orthostasis.  Today, the patient reports he gets nervous, has tremors, and falls sometimes.He uses a cane and walker at home. He denies chest pain. He has a little SOB.  No lower leg edema, palpitations, heart racing. He reports syncopal episodes after a coughing fit, which mainly occurs at the dinner table. He denies any recent fever or chills. BP is low today, but SBP is normally 120-130. He is not taking metoprolol . He takes amiodarone  daily. His wife administers midodrine  as needed. He takes lasix  as needed for swelling. He takes spiro 1/2 tab daily. He smokes 1/2 ppd. He uses O2 at night.   Studies Reviewed EKG Interpretation Date/Time:  Friday October 07 2024 15:28:14  EDT Ventricular Rate:  62 PR Interval:  156 QRS Duration:  90 QT Interval:  436 QTC Calculation: 442 R Axis:   -44  Text Interpretation: Sinus rhythm with Premature supraventricular complexes Left axis deviation When compared with ECG of 18-Jan-2024 11:38, Sinus rhythm has replaced Atrial fibrillation Criteria for Inferior infarct are no longer Present Nonspecific T wave abnormality no longer evident in Inferior leads Confirmed by Franchester, Braylee Bosher ([id number]) on 10/07/2024 3:31:11 PM    Echo 10/2020  1. Left ventricular ejection fraction, by estimation, is 50 %. The left  ventricle has low normal function. The left ventricle has no regional wall  motion abnormalities. There is moderate left ventricular hypertrophy. The  average left ventricular global  longitudinal strain is -2.3 %. The global longitudinal strain is abnormal.   2. Right ventricular systolic function is normal. The right ventricular  size is normal.   3. Left atrial size was mildly dilated.   4. The mitral valve is normal in structure. Mild mitral valve  regurgitation.   5. There is mild dilatation of the aortic root, measuring 41 mm. There is  borderline dilatation of the ascending aorta, measuring 37 mm.    Echo 12/2018 Study Conclusions   - Left ventricle: The cavity size was normal. There was mild    concentric hypertrophy. Systolic function was normal. The    estimated ejection fraction was in the range of 55% to 60%. Wall    motion was normal; there were no regional wall motion    abnormalities. The study is not technically sufficient to allow    evaluation of  LV diastolic function.  - Aorta: Aortic root dimension: 42 mm (ED).  - Aortic root: The aortic root was mildly dilated.  - Left atrium: The atrium was mildly to moderately dilated.  - Pulmonary arteries: Systolic pressure could not be accurately    estimated.    Heart monitor 06/2019 Event Monitor   Normal sinus rhythm,  Atrial Fibrillation occurred (32%  burden), ranging from 40-138 bpm (avg of 71 bpm),  the longest lasting 17 hours 44 mins with an avg rate of 68 bpm.    Avg HR of 55 bpm. Predominant heart rate in NSR is 40s to 55 bpm Avg heart rate in atrial fibrillation is 60 to 90 bpm   Isolated SVEs were rare (<1.0%), SVE Couplets were rare (<1.0%), and SVE Triplets were rare (<1.0%). Isolated VEs were rare (<1.0%), VE Couplets were rare (<1.0%), and no VE Triplets were present.    Signed, Velinda Lunger, MD, Ph.D [facility]      Physical Exam VS:  BP (!) 80/50 (BP Location: Right Arm, Patient Position: Sitting, Cuff Size: Large)   Pulse 62   Ht 6' (1.829 m)   Wt 223 lb (101.2 kg)   SpO2 97%   BMI 30.24 kg/m        Wt Readings from Last 3 Encounters:  10/07/24 223 lb (101.2 kg)  09/27/24 203 lb (92.1 kg)  07/25/24 217 lb 2 oz (98.5 kg)    GEN: Well nourished, well developed in no acute distress NECK: No JVD; No carotid bruits CARDIAC: RRR, no murmurs, rubs, gallops RESPIRATORY: +rhonchii ABDOMEN: Soft, non-tender, non-distended EXTREMITIES:  No edema; No deformity   ASSESSMENT AND PLAN  SOB Chronic HFpEF Patient reports chronic SOB. No chest pain reported. He appears euvolemic. He is a smoker with h/o COPD and OSA. He uses 2L O2 at night. I will update an echocardiogram. Recent labs show mild anemia, he is taking iron. I will refer the patient to pulmonology. He takes spironolactone  12.5mg  daily and lasix  as needed.   Syncope H/o hypotension/orthostasis He reports syncope while coughing, suspect vasovagal. He does have h/o orthostasis/hypotension, but the wife says BP is generally normal at home. He has midodrine  to take as needed, but rarely takes it. BP today is low and I recommend patient drink water and go home and take midodrine . Recent labs stable. I Would address coughing with pulmonology referral as above. He needs to quit smoking. If he continues to have syncope, will consider heart monitor.   Paroxysmal  Afib SSS/chronotropic incompetence He previously declined a pacemaker. Wife said metoprolol  would cause very low heart rates, so he has only been taking amiodarone  200mg  daily. EKG shows NSR 62bpm. Warfarin monitored by coumadin  clinic.   AAA No further follow-up per [facility], per patient report.  Smoking history COPD He is still smoking 1/2 ppd. Refer to pulmonology as above.   HLD LDL 64. Continue Pravastatin  20mg  daily.      Dispo: Follow-up in 3 months  Signed, Sylina Henion VEAR Fishman, PA-C

## 2024-10-10 ENCOUNTER — Other Ambulatory Visit: Payer: Self-pay | Admitting: Family Medicine

## 2024-10-10 ENCOUNTER — Encounter: Payer: Self-pay | Admitting: Pulmonary Disease

## 2024-10-10 ENCOUNTER — Encounter: Payer: Self-pay | Admitting: Family Medicine

## 2024-10-10 ENCOUNTER — Telehealth: Payer: Self-pay

## 2024-10-10 DIAGNOSIS — R251 Tremor, unspecified: Secondary | ICD-10-CM

## 2024-10-10 DIAGNOSIS — G6289 Other specified polyneuropathies: Secondary | ICD-10-CM

## 2024-10-10 DIAGNOSIS — R2681 Unsteadiness on feet: Secondary | ICD-10-CM

## 2024-10-10 DIAGNOSIS — G894 Chronic pain syndrome: Secondary | ICD-10-CM

## 2024-10-10 MED ORDER — HYDROCODONE-ACETAMINOPHEN 10-325 MG PO TABS: 1.0000 | ORAL_TABLET | Freq: Four times a day (QID) | ORAL | 0 refills | Status: DC | PRN

## 2024-10-10 NOTE — Addendum Note (Signed)
 Addended by: RILLA BALLER on: 10/10/2024 05:19 PM   Modules accepted: Orders

## 2024-10-10 NOTE — Telephone Encounter (Signed)
 Urinalysis ordered

## 2024-10-10 NOTE — Addendum Note (Signed)
 Addended by: RILLA BALLER on: 10/10/2024 02:00 PM   Modules accepted: Orders

## 2024-10-10 NOTE — Telephone Encounter (Signed)
 Sent to PCP regarding refill request.

## 2024-10-10 NOTE — Telephone Encounter (Signed)
 ERx

## 2024-10-10 NOTE — Telephone Encounter (Signed)
 Medication: Hydrocodone  Directions: ake 1 tablet by mouth every 6 (six) hours as needed for moderate pain (pain score 4-6) or severe pain (pain score 7-10).  Last given: 09/01/2024 Number refills: 0 Last o/v: 09/26/2024 Follow up:  Labs:

## 2024-10-17 ENCOUNTER — Telehealth: Payer: Self-pay

## 2024-11-02 ENCOUNTER — Ambulatory Visit: Attending: Cardiovascular Disease

## 2024-11-02 DIAGNOSIS — Z5181 Encounter for therapeutic drug level monitoring: Secondary | ICD-10-CM | POA: Diagnosis not present

## 2024-11-02 DIAGNOSIS — I4891 Unspecified atrial fibrillation: Secondary | ICD-10-CM | POA: Diagnosis not present

## 2024-11-02 LAB — POCT INR: INR: 2.2 (ref 2.0–3.0)

## 2024-11-02 NOTE — Patient Instructions (Signed)
 continue 1 tablet daily, EXCEPT 1.5 TABLETS ON Mondays, Wednesdays and Fridays Recheck INR in 6 weeks [phone]

## 2024-11-07 ENCOUNTER — Other Ambulatory Visit: Payer: Self-pay | Admitting: Cardiovascular Disease

## 2024-11-07 DIAGNOSIS — I5032 Chronic diastolic (congestive) heart failure: Secondary | ICD-10-CM

## 2024-11-07 DIAGNOSIS — I4891 Unspecified atrial fibrillation: Secondary | ICD-10-CM

## 2024-11-16 ENCOUNTER — Encounter: Payer: Self-pay | Admitting: Family Medicine

## 2024-11-16 DIAGNOSIS — G894 Chronic pain syndrome: Secondary | ICD-10-CM

## 2024-11-16 MED ORDER — HYDROCODONE-ACETAMINOPHEN 10-325 MG PO TABS: 1.0000 | ORAL_TABLET | Freq: Four times a day (QID) | ORAL | 0 refills | Status: DC | PRN

## 2024-11-16 NOTE — Telephone Encounter (Signed)
 Last OV: 09/26/24 Pending OV: 01/05/24 Medication: Hydrocodone  10-325 Directions: Take one tablet q4-6h prn Last Refill: 10/10/24 Qty: #120 with 0 refills

## 2024-11-21 ENCOUNTER — Ambulatory Visit: Admitting: Pulmonary Disease

## 2024-11-29 ENCOUNTER — Ambulatory Visit: Attending: Medical

## 2024-11-29 DIAGNOSIS — R0602 Shortness of breath: Secondary | ICD-10-CM

## 2024-11-29 LAB — ECHOCARDIOGRAM COMPLETE
AR max vel: 2.73 cm2
AV Area VTI: 2.69 cm2
AV Area mean vel: 2.75 cm2
AV Mean grad: 4 mmHg
AV Peak grad: 8.4 mmHg
Ao pk vel: 1.45 m/s
Area-P 1/2: 2.73 cm2

## 2024-11-30 ENCOUNTER — Telehealth: Payer: Self-pay

## 2024-11-30 ENCOUNTER — Ambulatory Visit: Payer: Self-pay | Admitting: Medical

## 2024-11-30 NOTE — Telephone Encounter (Signed)
 Gave pt a call pt is due for re-enrollment on AZ&ME (Breztri ),spoke with pt ,he wants pap mail out, faxed provider portion.

## 2024-12-02 ENCOUNTER — Other Ambulatory Visit: Payer: Self-pay | Admitting: Family Medicine

## 2024-12-02 DIAGNOSIS — E1140 Type 2 diabetes mellitus with diabetic neuropathy, unspecified: Secondary | ICD-10-CM

## 2024-12-05 ENCOUNTER — Other Ambulatory Visit: Payer: Self-pay

## 2024-12-05 DIAGNOSIS — D649 Anemia, unspecified: Secondary | ICD-10-CM

## 2024-12-06 NOTE — Telephone Encounter (Signed)
 Printed provider portion placed in your folder for review.

## 2024-12-06 NOTE — Telephone Encounter (Signed)
 Filled and placed in CMA box.

## 2024-12-07 LAB — FECAL OCCULT BLOOD, IMMUNOCHEMICAL: Fecal Occult Bld: POSITIVE — AB

## 2024-12-07 NOTE — Telephone Encounter (Signed)
 Faxed back to PAP and sent to scan

## 2024-12-07 NOTE — Telephone Encounter (Signed)
 Received provider portion Az&me Breztri , waiting on pt portion.

## 2024-12-09 ENCOUNTER — Other Ambulatory Visit: Payer: Self-pay

## 2024-12-09 DIAGNOSIS — N3941 Urge incontinence: Secondary | ICD-10-CM

## 2024-12-09 NOTE — Addendum Note (Signed)
 Addended by: ISADORA RAISIN on: 12/09/2024 04:49 PM   Modules accepted: Orders

## 2024-12-10 LAB — URINALYSIS, ROUTINE W REFLEX MICROSCOPIC
Bacteria, UA: NONE SEEN /HPF
Bilirubin Urine: NEGATIVE
Hgb urine dipstick: NEGATIVE
Hyaline Cast: >60 /LPF — AB
Nitrite: NEGATIVE
RBC / HPF: NONE SEEN /HPF (ref 0–2)
Specific Gravity, Urine: 1.028 (ref 1.001–1.035)
Squamous Epithelial / HPF: NONE SEEN /HPF
WBC, UA: NONE SEEN /HPF (ref 0–5)
pH: <=5 — AB (ref 5.0–8.0)

## 2024-12-10 LAB — MICROSCOPIC MESSAGE

## 2024-12-11 ENCOUNTER — Encounter: Payer: Self-pay | Admitting: Family Medicine

## 2024-12-12 ENCOUNTER — Ambulatory Visit: Payer: Self-pay | Admitting: Family Medicine

## 2024-12-14 ENCOUNTER — Ambulatory Visit: Attending: Cardiovascular Disease

## 2024-12-14 DIAGNOSIS — Z5181 Encounter for therapeutic drug level monitoring: Secondary | ICD-10-CM

## 2024-12-14 DIAGNOSIS — I4891 Unspecified atrial fibrillation: Secondary | ICD-10-CM

## 2024-12-14 LAB — POCT INR: INR: 3.8 — AB (ref 2.0–3.0)

## 2024-12-14 NOTE — Patient Instructions (Signed)
 Hold tonight  only then continue 1 tablet daily, EXCEPT 1.5 TABLETS ON Mondays, Wednesdays and Fridays Recheck INR in 4 weeks [phone]

## 2024-12-16 ENCOUNTER — Encounter: Payer: Self-pay | Admitting: Family Medicine

## 2024-12-16 ENCOUNTER — Ambulatory Visit: Admitting: Family Medicine

## 2024-12-16 VITALS — BP 110/70 | HR 48 | Temp 97.7°F | Ht 72.0 in | Wt 223.0 lb

## 2024-12-16 DIAGNOSIS — R251 Tremor, unspecified: Secondary | ICD-10-CM

## 2024-12-16 DIAGNOSIS — R197 Diarrhea, unspecified: Secondary | ICD-10-CM | POA: Diagnosis not present

## 2024-12-16 DIAGNOSIS — D649 Anemia, unspecified: Secondary | ICD-10-CM

## 2024-12-16 LAB — CBC WITH DIFFERENTIAL/PLATELET
Basophils Absolute: 0 K/uL (ref 0.0–0.1)
Basophils Relative: 0.7 % (ref 0.0–3.0)
Eosinophils Absolute: 0.1 K/uL (ref 0.0–0.7)
Eosinophils Relative: 1.7 % (ref 0.0–5.0)
HCT: 29.2 % — ABNORMAL LOW (ref 39.0–52.0)
Hemoglobin: 9 g/dL — ABNORMAL LOW (ref 13.0–17.0)
Lymphocytes Relative: 18.8 % (ref 12.0–46.0)
Lymphs Abs: 1 K/uL (ref 0.7–4.0)
MCHC: 30.9 g/dL (ref 30.0–36.0)
MCV: 84.9 fl (ref 78.0–100.0)
Monocytes Absolute: 0.3 K/uL (ref 0.1–1.0)
Monocytes Relative: 5.9 % (ref 3.0–12.0)
Neutro Abs: 3.7 K/uL (ref 1.4–7.7)
Neutrophils Relative %: 72.9 % (ref 43.0–77.0)
Platelets: 214 K/uL (ref 150.0–400.0)
RBC: 3.44 Mil/uL — ABNORMAL LOW (ref 4.22–5.81)
RDW: 16.3 % — ABNORMAL HIGH (ref 11.5–15.5)
WBC: 5.1 K/uL (ref 4.0–10.5)

## 2024-12-16 LAB — COMPREHENSIVE METABOLIC PANEL WITH GFR
ALT: 12 U/L (ref 3–53)
AST: 13 U/L (ref 5–37)
Albumin: 3.6 g/dL (ref 3.5–5.2)
Alkaline Phosphatase: 81 U/L (ref 39–117)
BUN: 12 mg/dL (ref 6–23)
CO2: 31 meq/L (ref 19–32)
Calcium: 8.5 mg/dL (ref 8.4–10.5)
Chloride: 104 meq/L (ref 96–112)
Creatinine, Ser: 1.04 mg/dL (ref 0.40–1.50)
GFR: 67.76 mL/min
Glucose, Bld: 232 mg/dL — ABNORMAL HIGH (ref 70–99)
Potassium: 4.3 meq/L (ref 3.5–5.1)
Sodium: 140 meq/L (ref 135–145)
Total Bilirubin: 0.4 mg/dL (ref 0.2–1.2)
Total Protein: 6.1 g/dL (ref 6.0–8.3)

## 2024-12-16 LAB — FERRITIN: Ferritin: 11.8 ng/mL — ABNORMAL LOW (ref 22.0–322.0)

## 2024-12-16 LAB — IBC PANEL
Iron: 24 ug/dL — ABNORMAL LOW (ref 42–165)
Saturation Ratios: 5.5 % — ABNORMAL LOW (ref 20.0–50.0)
TIBC: 432.6 ug/dL (ref 250.0–450.0)
Transferrin: 309 mg/dL (ref 212.0–360.0)

## 2024-12-16 NOTE — Progress Notes (Signed)
 " Ph: [phone] Fax: [phone]   Patient ID: [name], male    DOB: 1946-03-14, 79 y.o.   MRN: [id number]  This visit was conducted in person.  BP 110/70 (BP Location: Right Arm, Patient Position: Sitting, Cuff Size: Normal)   Pulse (!) 48   Temp 97.7 F (36.5 C) (Oral)   Ht 6' (1.829 m)   Wt 223 lb (101.2 kg)   SpO2 92%   BMI 30.24 kg/m    CC: diarrhea  Subjective:   HPI: [name] is a 79 y.o. male presenting on 12/16/2024 for Acute Visit (Want to talk over specimen results, diarrhea has stopped appetite /Pt has less of a Wants to discuss meds/Pt's hand tremors worsening over last 1-2 months, make its hard to eat. Pt states his whole body shakes at night and make it hard for him to lay down//[name], pt's wife is in room)   See recent MyChart message - significant diarrhea that started ~12/09/2024. Treating with pepto bismol with improvement. This lasted about 3-4 days, associated with stool accidents. Diarrhea associated with abdominal lower pain and small amt blood in stool. Loose stools, not watery, several times a day. Now bowels seem back to normal - about one BM every 5 days. He feels weak after BM.   No new pallor, nausea, vomiting, chest pain or dyspnea.  Decreased appetite. Wife notes cough and runny nose when he eats.  Ongoing dizziness with standing.   No new foods, recent travel, no change in meds besides increased gabapentin  dose as per below.   Saw Dr Lane 11/2024 for tremor, neuropathy, note reviewed. Gabapentin  increased to 600mg  TID. Planned 3 mo f/u.  They are using gabapentin  600mg  in am and 300mg  at night time - higher dose at night caused dry mouth.       Relevant past medical, surgical, family and social history reviewed and updated as indicated. Interim medical history since our last visit reviewed. Allergies and medications reviewed and updated. Outpatient Medications Prior to Visit  Medication Sig Dispense Refill   albuterol   (VENTOLIN  HFA) 108 (90 Base) MCG/ACT inhaler INHALE 2 PUFFS INTO THE LUNGS EVERY 6 HOURS AS NEEDED FOR WHEEZING OR SHORTNESS OF BREATH 18 g 3   amiodarone  (PACERONE ) 200 MG tablet TAKE 1 TABLET BY MOUTH AS NEEDED FOR HEART RATE> 100 60 tablet 8   aspirin  81 MG EC tablet Take 1 tablet (81 mg total) by mouth daily. Swallow whole.     Budeson-Glycopyrrol-Formoterol (BREZTRI  AEROSPHERE) 160-9-4.8 MCG/ACT AERO Inhale 2 puffs into the lungs in the morning and at bedtime. 3 each 3   Cholecalciferol  (VITAMIN D ) 50 MCG (2000 UT) CAPS Take 1 capsule (2,000 Units total) by mouth daily. 30 capsule    Coenzyme Q10 (COQ-10 PO) Take 1 tablet by mouth daily.     Cyanocobalamin  (B-12) 1000 MCG SUBL Place 1 tablet under the tongue every Monday, Wednesday, and Friday. 30 tablet    diclofenac  Sodium (VOLTAREN ) 1 % GEL Apply 4 g topically 3 (three) times daily. As needed 100 g 3   diltiazem  (CARDIZEM ) 30 MG tablet TAKE 1 TABLET(30 MG) BY MOUTH DAILY AS NEEDED FOR FAST HEART RATE 30 tablet 3   diphenoxylate -atropine  (LOMOTIL ) 2.5-0.025 MG tablet Take 1 tablet by mouth 2 (two) times daily as needed. 20 tablet 0   Ferrous Sulfate  (IRON) 325 (65 Fe) MG TABS Take by mouth daily. (Patient taking differently: Take by mouth daily as needed.)     fish oil-omega-3  fatty acids 1000 MG capsule Take 2 g by mouth daily.     furosemide  (LASIX ) 20 MG tablet TAKE 1 TABLET BY MOUTH EVERY DAY AS NEEDED FOR FLUID RETENTION 90 tablet 1   gabapentin  (NEURONTIN ) 600 MG tablet Take 1 tablet (600 mg total) by mouth 2 (two) times daily. 180 tablet 1   HYDROcodone -acetaminophen  (NORCO) 10-325 MG tablet Take 1 tablet by mouth every 6 (six) hours as needed for moderate pain (pain score 4-6) or severe pain (pain score 7-10). 120 tablet 0   ipratropium-albuterol  (DUONEB) 0.5-2.5 (3) MG/3ML SOLN Take 3 mLs by nebulization every 6 (six) hours as needed. 360 mL 6   loratadine  (CLARITIN ) 10 MG tablet Take 1 tablet (10 mg total) by mouth daily.      metFORMIN  (GLUCOPHAGE -XR) 500 MG 24 hr tablet TAKE 1 TABLET(500 MG) BY MOUTH TWICE DAILY WITH A MEAL 180 tablet 4   methocarbamol  (ROBAXIN ) 500 MG tablet TAKE 1 TABLET(500 MG) BY MOUTH EVERY 8 HOURS AS NEEDED FOR MUSCLE SPASMS 180 tablet 1   metoprolol  tartrate (LOPRESSOR ) 25 MG tablet Take 0.5 tablets (12.5 mg total) by mouth daily as needed (fast heart rate).     midodrine  (PROAMATINE ) 10 MG tablet Take 1 tablet (10 mg total) by mouth 3 (three) times daily as needed. 90 tablet 1   nitroGLYCERIN  (NITROLINGUAL ) 0.4 MG/SPRAY spray USE 1 SPRAY AS DIRECTED EVERY 5 MINUTES AS NEEDED 4.9 g 1   omeprazole  (PRILOSEC) 40 MG capsule Take 1 capsule (40 mg total) by mouth daily. 90 capsule 0   OXYGEN  Inhale 3 L into the lungs at bedtime.     pravastatin  (PRAVACHOL ) 20 MG tablet Take 1 tablet (20 mg total) by mouth daily. 90 tablet 0   spironolactone  (ALDACTONE ) 25 MG tablet TAKE 1/2 TABLET(12.5 MG) BY MOUTH DAILY 45 tablet 3   tamsulosin  (FLOMAX ) 0.4 MG CAPS capsule Take 1 capsule (0.4 mg total) by mouth daily. 30 capsule 11   trimethoprim  (TRIMPEX ) 100 MG tablet Take 1 tablet (100 mg total) by mouth daily. 30 tablet 11   warfarin (COUMADIN ) 2.5 MG tablet TAKE 1 TABLET TO 1 AND 1/2 TABLETS BY MOUTH DAILY AS DIRECTED AFTER ANTI-COAG CLINIC 120 tablet 0   predniSONE  (DELTASONE ) 20 MG tablet Take two tablets daily for 3 days followed by one tablet daily for 4 days (Patient not taking: Reported on 12/16/2024) 10 tablet 0   No facility-administered medications prior to visit.     Per HPI unless specifically indicated in ROS section below Review of Systems  Objective:  BP 110/70 (BP Location: Right Arm, Patient Position: Sitting, Cuff Size: Normal)   Pulse (!) 48   Temp 97.7 F (36.5 C) (Oral)   Ht 6' (1.829 m)   Wt 223 lb (101.2 kg)   SpO2 92%   BMI 30.24 kg/m   Wt Readings from Last 3 Encounters:  12/16/24 223 lb (101.2 kg)  10/07/24 223 lb (101.2 kg)  09/27/24 203 lb (92.1 kg)      Physical  Exam Vitals and nursing note reviewed.  Constitutional:      Appearance: Normal appearance. He is not ill-appearing.  HENT:     Head: Normocephalic and atraumatic.     Mouth/Throat:     Mouth: Mucous membranes are moist.     Pharynx: Oropharynx is clear. No oropharyngeal exudate or posterior oropharyngeal erythema.  Eyes:     Extraocular Movements: Extraocular movements intact.     Pupils: Pupils are equal, round, and reactive to light.  Cardiovascular:     Rate and Rhythm: Regular rhythm. Bradycardia present.     Pulses: Normal pulses.     Heart sounds: Normal heart sounds. No murmur heard. Pulmonary:     Effort: Pulmonary effort is normal. No respiratory distress.     Breath sounds: Normal breath sounds. No wheezing, rhonchi or rales.  Musculoskeletal:     Right lower leg: No edema.     Left lower leg: No edema.  Skin:    General: Skin is warm and dry.     Coloration: Skin is pale.     Findings: No rash.  Neurological:     Mental Status: He is alert.  Psychiatric:        Mood and Affect: Mood normal.        Behavior: Behavior normal.       Results for orders placed or performed in visit on 12/14/24  POCT INR   Collection Time: 12/14/24  2:26 PM  Result Value Ref Range   INR 3.8 (A) 2.0 - 3.0   POC INR     *Note: Due to a large number of results and/or encounters for the requested time period, some results have not been displayed. A complete set of results can be found in Results Review.   Lab Results  Component Value Date   NA 140 09/26/2024   CL 103 09/26/2024   K 4.3 09/26/2024   CO2 27 09/26/2024   BUN 17 09/26/2024   CREATININE 1.11 09/26/2024   GFR 62.76 09/26/2024   CALCIUM  8.4 09/26/2024   PHOS 3.7 11/03/2018   ALBUMIN 3.7 09/26/2024   GLUCOSE 195 (H) 09/26/2024    Lab Results  Component Value Date   ALT 11 09/26/2024   AST 14 09/26/2024   ALKPHOS 76 09/26/2024   BILITOT 0.4 09/26/2024    Lab Results  Component Value Date   WBC 5.4 09/26/2024    HGB 10.3 (L) 09/26/2024   HCT 32.2 (L) 09/26/2024   MCV 89.6 09/26/2024   PLT 222.0 09/26/2024    Lab Results  Component Value Date   TSH 4.03 09/26/2024   Lab Results  Component Value Date   IRON 70 09/15/2023   TIBC 382.2 09/15/2023   FERRITIN 24.7 09/15/2023    Assessment & Plan:   Problem List Items Addressed This Visit     Diarrhea - Primary   Episode last week of acute diarrheal illness at the time treated with pepto bismol that is now largely resolved - anticipate he had viral gastroenteritis. Reviewed with patient. Update if recurrent diarrhea.      Relevant Orders   CBC with Differential/Platelet   Comprehensive metabolic panel with GFR   Anemia   Chronic normocytic anemia with recent iFOB positive (in setting of acute GI illness as per above) - update CBC today. He also endorses some pica - will update iron panel.  He has previously declined further GI evaluation but would be open to re-discussion if progressive anemia noted.       Relevant Orders   Ferritin   IBC panel   Tremor   He established with neurology who recommended trial gabapentin  600mg  TID and planned f/u 3 mo. They are currently limiting dosing to 600/300mg  daily due to worsening night time dry mouth at higher dose        No orders of the defined types were placed in this encounter.   Orders Placed This Encounter  Procedures   CBC with Differential/Platelet   Comprehensive  metabolic panel with GFR   Ferritin   IBC panel    Patient Instructions  Diarrhea may have been GI bug - glad you're doing better from this standpoint.  Labs today to check on cause of weakness, fatigue, low appetite  Follow up plan: Return if symptoms worsen or fail to improve.  Anton Blas, MD   "

## 2024-12-16 NOTE — Patient Instructions (Addendum)
 Diarrhea may have been GI bug - glad you're doing better from this standpoint.  Labs today to check on cause of weakness, fatigue, low appetite

## 2024-12-16 NOTE — Assessment & Plan Note (Addendum)
 He established with neurology who recommended trial gabapentin  600mg  TID and planned f/u 3 mo. They are currently limiting dosing to 600/300mg  daily due to worsening night time dry mouth at higher dose

## 2024-12-16 NOTE — Assessment & Plan Note (Signed)
 Episode last week of acute diarrheal illness at the time treated with pepto bismol that is now largely resolved - anticipate he had viral gastroenteritis. Reviewed with patient. Update if recurrent diarrhea.

## 2024-12-16 NOTE — Assessment & Plan Note (Addendum)
 Chronic normocytic anemia with recent iFOB positive (in setting of acute GI illness as per above) - update CBC today. He also endorses some pica - will update iron panel.  He has previously declined further GI evaluation but would be open to re-discussion if progressive anemia noted.

## 2024-12-17 ENCOUNTER — Other Ambulatory Visit: Payer: Self-pay | Admitting: Family Medicine

## 2024-12-17 DIAGNOSIS — J449 Chronic obstructive pulmonary disease, unspecified: Secondary | ICD-10-CM

## 2024-12-20 ENCOUNTER — Ambulatory Visit: Payer: Self-pay | Admitting: Family Medicine

## 2024-12-20 ENCOUNTER — Encounter: Payer: Self-pay | Admitting: Family Medicine

## 2024-12-20 DIAGNOSIS — G894 Chronic pain syndrome: Secondary | ICD-10-CM

## 2024-12-20 DIAGNOSIS — D509 Iron deficiency anemia, unspecified: Secondary | ICD-10-CM

## 2024-12-20 DIAGNOSIS — R195 Other fecal abnormalities: Secondary | ICD-10-CM

## 2024-12-20 MED ORDER — HYDROCODONE-ACETAMINOPHEN 10-325 MG PO TABS: 1.0000 | ORAL_TABLET | Freq: Four times a day (QID) | ORAL | 0 refills | Status: AC | PRN

## 2024-12-21 ENCOUNTER — Encounter: Payer: Self-pay | Admitting: Pulmonary Disease

## 2024-12-21 ENCOUNTER — Ambulatory Visit: Admitting: Pulmonary Disease

## 2024-12-21 VITALS — BP 108/52 | HR 49 | Ht 72.0 in | Wt 234.0 lb

## 2024-12-21 DIAGNOSIS — R1311 Dysphagia, oral phase: Secondary | ICD-10-CM | POA: Diagnosis not present

## 2024-12-21 DIAGNOSIS — J9610 Chronic respiratory failure, unspecified whether with hypoxia or hypercapnia: Secondary | ICD-10-CM | POA: Diagnosis not present

## 2024-12-21 DIAGNOSIS — J449 Chronic obstructive pulmonary disease, unspecified: Secondary | ICD-10-CM | POA: Diagnosis not present

## 2024-12-21 DIAGNOSIS — F17210 Nicotine dependence, cigarettes, uncomplicated: Secondary | ICD-10-CM

## 2024-12-21 DIAGNOSIS — J4489 Other specified chronic obstructive pulmonary disease: Secondary | ICD-10-CM

## 2024-12-21 MED ORDER — LEVALBUTEROL TARTRATE 45 MCG/ACT IN AERO: 2.0000 | INHALATION_SPRAY | RESPIRATORY_TRACT | 2 refills | Status: AC | PRN

## 2024-12-21 NOTE — Progress Notes (Signed)
 "  Synopsis: Referred in by Franchester Mikey DEL, PA-C   Subjective:   PATIENT ID: Jim LELON Edis GENDER: male DOB: 03-28-1946, MRN: [id number]  Chief Complaint  Patient presents with   Establish Care   Cough   Nicotine Dependence    HPI Discussed the use of AI scribe software for clinical note transcription with the patient, who gave verbal consent to proceed.  History of Present Illness   [name] is an 78 year old male with COPD and essential tremor who presents with worsening tremors and breathing difficulties.  He experiences constant tremors, primarily affecting his hands, which have worsened since late last year. He is under the care of a neurologist, who told him that Parkinson's disease was not the cause of his symptoms. He is currently taking gabapentin  three times a day, with two doses in the morning and one at night. The tremors make it difficult for him to eat. Metoprolol  was suggested, but he does not take it because of concerns about his low heart rate.  He has COPD and uses a Breztri  inhaler, taking six puffs at night before bed, although he does not use it during the day. He also has albuterol  for as-needed use. He experiences wheezing and coughs up phlegm, especially at night. He uses oxygen  at night and sometimes during naps. He continues to smoke, which may exacerbate his symptoms.  He has experienced episodes of choking and coughing while eating, leading to temporary blackouts. His caregiver recalls two such incidents last year. He has a dry mouth, which may be related to his medication use. He has a history of iron deficiency and a decreased appetite, contributing to weight loss from 234 to 212 pounds. He also experiences weakness and dizziness, particularly when his heart rate increases beyond 60-70 bpm.  He reports a fall at a car wash recently, where he lost his balance and fell, experiencing confusion upon waking. He does not walk much during the day due to weakness  rather than breathing difficulties.        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    Employer: OTHER  Tobacco Use   Smoking status: Every Day    Current packs/day: 0.00    Average packs/day: 2.5 packs/day for 65.0 years (162.5 ttl pk-yrs)    Types: Cigarettes    Start date: 10/28/1956    Last attempt to quit: 10/28/2021    Years since quitting: 3.1   Smokeless tobacco: Never   Tobacco comments:    1ppd 08/20/2021  Vaping Use   Vaping status: Never Used  Substance and Sexual Activity   Alcohol use: Not Currently    Comment: occassionally   Drug use: No   Sexual activity: Not on file  Other Topics Concern   Not on file  Social  History Narrative   Caffeine: 1 cup coffee, 1/2 gallon unsweet tea, 2 soda/day   Lives with wife and 1 dog, 6 cats outside.   Occ: Long distance truck driver   Started seeking care when received medicare.   H/o noncompliance   Social Drivers of Health   Tobacco Use: High Risk (12/21/2024)   Patient History    Smoking Tobacco Use: Every Day    Smokeless Tobacco Use: Never    Passive Exposure: Not on file  Financial Resource Strain: Low Risk  (11/14/2024)   Received from [facility]   Overall Financial Resource Strain (CARDIA)    Difficulty of Paying Living Expenses: Not very hard  Food Insecurity: No Food Insecurity (11/14/2024)   Received from [facility]   Epic    Within the past 12  months, you worried that your food would run out before you got the money to buy more.: Never true    Within the past 12 months, the food you bought just didn't last and you didn't have money to get more.: Never true  Transportation Needs: No Transportation Needs (11/14/2024)   Received from [facility] - Transportation    In the past 12 months, has lack of transportation kept you from medical appointments or from getting medications?: No    Lack of Transportation (Non-Medical): No  Physical Activity: Inactive (09/27/2024)   Exercise Vital Sign    Days of Exercise per Week: 0 days    Minutes of Exercise per Session: Not on file  Stress: No Stress Concern Present (09/27/2024)   [employer] - Occupational Stress Questionnaire    Feeling of Stress: Only a little  Social Connections: Unknown (09/27/2024)   Social Connection and Isolation Panel    Frequency of Communication with Friends and Family: Once a week    Frequency of Social Gatherings with Friends and Family: Patient declined    Attends Religious Services: Patient declined    Database Administrator or Organizations: No    Attends Engineer, Structural: Not on file    Marital Status: Married  Catering Manager Violence: Not At Risk (09/27/2024)   Epic    Fear of Current or Ex-Partner: No    Emotionally Abused: No    Physically Abused: No    Sexually Abused: No  Depression (PHQ2-9): Medium Risk (12/16/2024)   Depression (PHQ2-9)    PHQ-2 Score: 7  Alcohol Screen: Not on file  Housing: Low Risk  (11/14/2024)   Received from [facility]   Epic    In the last 12 months, was there a time when you were not able to pay the mortgage or rent on time?: No    In the past 12 months, how many times have you moved where you were living?: 0    At any time in the past 12 months, were you homeless or living in a shelter (including now)?: No  Utilities: Not At Risk  (11/14/2024)   Received from [facility]   Epic    In the past 12 months has the electric, gas, oil, or water company threatened to shut off services in your home?: No  Health Literacy: Inadequate Health Literacy (09/27/2024)   B1300 Health Literacy    Frequency of need for help with medical instructions: Always        Objective:   Vitals:   12/21/24 0936  BP: (!) 108/52  Pulse: (!) 49  SpO2: 94%  Weight: 234 lb (106.1 kg)  Height: 6' (1.829 m)   94% on  RA BMI Readings from Last 3 Encounters:  12/21/24 31.74 kg/m  12/16/24 30.24 kg/m  10/07/24 30.24 kg/m   Wt Readings from Last 3 Encounters:  12/21/24 234 lb (106.1 kg)  12/16/24 223 lb (101.2 kg)  10/07/24 223 lb (101.2 kg)    Physical Exam GEN: NAD. Hand tremors noted HEENT: Supple Neck, Reactive Pupils, EOMI  CVS: Normal S1, Normal S2, RRR, No murmurs or ES appreciated  Lungs: Faint bilateral wheezing Abdomen: Soft, non tender, non distended, + BS  Extremities: Warm and well perfused, No edema   Ancillary Information   CBC    Component Value Date/Time   WBC 5.1 12/16/2024 1335   RBC 3.44 (L) 12/16/2024 1335   HGB 9.0 (L) 12/16/2024 1335   HCT 29.2 (L) 12/16/2024 1335   PLT 214.0 12/16/2024 1335   MCV 84.9 12/16/2024 1335   MCH 31.5 11/03/2021 1102   MCHC 30.9 12/16/2024 1335   RDW 16.3 (H) 12/16/2024 1335   LYMPHSABS 1.0 12/16/2024 1335   MONOABS 0.3 12/16/2024 1335   EOSABS 0.1 12/16/2024 1335   BASOSABS 0.0 12/16/2024 1335        No data to display           Assessment & Plan:  Assessment and Plan    #Chronic obstructive pulmonary disease stage III gp E COPD with significant wheezing and phlegm, suboptimal inhaler regimen. Breztri  preferred for ease of use and affordability. Dry mouth noted. - Continue Breztri , two puffs morning and night. - Prescribed levalbuterol , two puffs every six hours as needed. - Start Ohtuvayre  nebulizer for wheezing and inflammation. -  Ordered CT scan of chest forparenchymal lung disease evaluation. - Advised rinsing mouth after Breztri  to alleviate dry mouth.  #Dysphagia Episodes of choking and coughing during meals, likely neurological, possibly related to essential tremor. - Referred to speech therapist for swallow evaluation and potential swallow test.  #Nicotine dependence Continued smoking despite COPD diagnosis. Smoking cessation crucial for COPD management. - Advised smoking cessation. Smoking/Tobacco Cessation Counseling [name] is a current user of tobacco or nicotine products. He is ready to quit at this time. Counseling provided today addressed the risks of continued use and the benefits of cessation. Discussed tobacco/nicotine use history, readiness to quit, and evidence-based treatment options including behavioral strategies, support resources, and pharmacologic therapies. Provided encouragement and educational materials on steps and resources to quit smoking. Patient questions were addressed, and follow-up recommended for continued support. Total time spent on counseling: 5 minutes.     #Chronic respiratory failure on home oxygen  therapy Chronic respiratory failure managed with home oxygen  therapy, necessary for adequate oxygenation. - Continue home oxygen  therapy at night and during naps.     Return in about 3 months (around 03/21/2025).  I personally spent a total of 60 minutes in the care of the patient today including preparing to see the patient, getting/reviewing separately obtained history, performing a medically appropriate exam/evaluation, counseling and educating, placing orders, documenting clinical information in the EHR, independently interpreting results, and communicating results.   Darrin Barn, MD [facility] 12/21/2024 2:47 PM  "

## 2024-12-27 ENCOUNTER — Ambulatory Visit: Admitting: Speech Pathology

## 2024-12-28 ENCOUNTER — Ambulatory Visit: Attending: Pulmonary Disease | Admitting: Speech Pathology

## 2024-12-28 DIAGNOSIS — R1312 Dysphagia, oropharyngeal phase: Secondary | ICD-10-CM | POA: Insufficient documentation

## 2024-12-28 DIAGNOSIS — R1311 Dysphagia, oral phase: Secondary | ICD-10-CM | POA: Diagnosis not present
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
## 2024-12-29 ENCOUNTER — Telehealth: Payer: Self-pay

## 2024-12-29 ENCOUNTER — Encounter: Payer: Self-pay | Admitting: Pulmonary Disease

## 2024-12-29 NOTE — Telephone Encounter (Signed)
 Received Ohtuvayre  new start paperwork. Completed form and faxed with clinicals and insurance card copy to [facility] [employer]   Phone#: [phone] Fax#: [phone]

## 2024-12-30 NOTE — Telephone Encounter (Signed)
 Received fax from [employer] confirming receipt of Ohtuvayre  enrollment form  Patient IDL [phone]

## 2025-01-04 ENCOUNTER — Ambulatory Visit: Admitting: Family Medicine

## 2025-01-04 ENCOUNTER — Encounter: Payer: Self-pay | Admitting: Family Medicine

## 2025-01-04 VITALS — BP 122/84 | HR 58 | Temp 97.5°F | Ht 72.0 in | Wt 225.5 lb

## 2025-01-04 DIAGNOSIS — I5032 Chronic diastolic (congestive) heart failure: Secondary | ICD-10-CM

## 2025-01-04 DIAGNOSIS — Z7901 Long term (current) use of anticoagulants: Secondary | ICD-10-CM

## 2025-01-04 DIAGNOSIS — I495 Sick sinus syndrome: Secondary | ICD-10-CM

## 2025-01-04 DIAGNOSIS — E1169 Type 2 diabetes mellitus with other specified complication: Secondary | ICD-10-CM

## 2025-01-04 DIAGNOSIS — E785 Hyperlipidemia, unspecified: Secondary | ICD-10-CM

## 2025-01-04 DIAGNOSIS — I482 Chronic atrial fibrillation, unspecified: Secondary | ICD-10-CM

## 2025-01-04 DIAGNOSIS — E559 Vitamin D deficiency, unspecified: Secondary | ICD-10-CM

## 2025-01-04 DIAGNOSIS — J9610 Chronic respiratory failure, unspecified whether with hypoxia or hypercapnia: Secondary | ICD-10-CM

## 2025-01-04 DIAGNOSIS — D509 Iron deficiency anemia, unspecified: Secondary | ICD-10-CM

## 2025-01-04 DIAGNOSIS — I480 Paroxysmal atrial fibrillation: Secondary | ICD-10-CM

## 2025-01-04 DIAGNOSIS — R197 Diarrhea, unspecified: Secondary | ICD-10-CM

## 2025-01-04 DIAGNOSIS — I25118 Atherosclerotic heart disease of native coronary artery with other forms of angina pectoris: Secondary | ICD-10-CM | POA: Diagnosis not present

## 2025-01-04 DIAGNOSIS — J449 Chronic obstructive pulmonary disease, unspecified: Secondary | ICD-10-CM

## 2025-01-04 DIAGNOSIS — C61 Malignant neoplasm of prostate: Secondary | ICD-10-CM

## 2025-01-04 DIAGNOSIS — E1140 Type 2 diabetes mellitus with diabetic neuropathy, unspecified: Secondary | ICD-10-CM | POA: Diagnosis not present

## 2025-01-04 DIAGNOSIS — G8929 Other chronic pain: Secondary | ICD-10-CM

## 2025-01-04 DIAGNOSIS — G4733 Obstructive sleep apnea (adult) (pediatric): Secondary | ICD-10-CM

## 2025-01-04 DIAGNOSIS — F1120 Opioid dependence, uncomplicated: Secondary | ICD-10-CM | POA: Diagnosis not present

## 2025-01-04 DIAGNOSIS — I110 Hypertensive heart disease with heart failure: Secondary | ICD-10-CM | POA: Diagnosis not present

## 2025-01-04 DIAGNOSIS — I10 Essential (primary) hypertension: Secondary | ICD-10-CM

## 2025-01-04 DIAGNOSIS — R2681 Unsteadiness on feet: Secondary | ICD-10-CM

## 2025-01-04 DIAGNOSIS — I714 Abdominal aortic aneurysm, without rupture, unspecified: Secondary | ICD-10-CM

## 2025-01-04 DIAGNOSIS — D6869 Other thrombophilia: Secondary | ICD-10-CM

## 2025-01-04 DIAGNOSIS — E782 Mixed hyperlipidemia: Secondary | ICD-10-CM

## 2025-01-04 DIAGNOSIS — I951 Orthostatic hypotension: Secondary | ICD-10-CM

## 2025-01-04 DIAGNOSIS — N3941 Urge incontinence: Secondary | ICD-10-CM

## 2025-01-04 DIAGNOSIS — J9611 Chronic respiratory failure with hypoxia: Secondary | ICD-10-CM

## 2025-01-04 DIAGNOSIS — R251 Tremor, unspecified: Secondary | ICD-10-CM

## 2025-01-04 DIAGNOSIS — Z95828 Presence of other vascular implants and grafts: Secondary | ICD-10-CM

## 2025-01-04 MED ORDER — PRAVASTATIN SODIUM 20 MG PO TABS: 20.0000 mg | ORAL_TABLET | Freq: Every day | ORAL | 3 refills | Status: AC

## 2025-01-04 MED ORDER — OMEPRAZOLE 40 MG PO CPDR: 40.0000 mg | DELAYED_RELEASE_CAPSULE | Freq: Every day | ORAL | 3 refills | Status: AC

## 2025-01-04 NOTE — Progress Notes (Unsigned)
 " Ph: [phone] Fax: [phone]   Patient ID: [name], male    DOB: 1946-03-26, 79 y.o.   MRN: [id number]  This visit was conducted in person.  BP 122/84   Pulse (!) 58   Temp (!) 97.5 F (36.4 C) (Oral)   Ht 6' (1.829 m)   Wt 225 lb 8 oz (102.3 kg)   SpO2 95% Comment: Pt on 2L SPO2  BMI 30.58 kg/m   BP Readings from Last 3 Encounters:  01/04/25 122/84  12/21/24 (!) 108/52  12/16/24 110/70    CC: CPE - converted to acute visit  Subjective:   HPI: [name] is a 79 y.o. male presenting on 01/04/2025 for Annual Exam (Pt would like to discuss getting a Rx for O2 (tank))   Saw health advisor 09/2024 for medicare wellness visit. Note reviewed.    No results found.  Flowsheet Row Office Visit from 01/04/2025 in [facility] [place]  PHQ-2 Total Score 0       01/04/2025    3:51 PM 12/16/2024   12:51 PM 09/27/2024    2:07 PM 07/25/2024    3:54 PM 06/27/2024    4:06 PM  Fall Risk   Falls in the past year? 0 1 0  0 1  Number falls in past yr: 0 1 0  0 0  Injury with Fall? 0 0 0   0    Risk for fall due to : Other (Comment)   No Fall Risks   Follow up Falls evaluation completed Falls evaluation completed  Falls evaluation completed      Manually entered by patient   Data saved with a previous flowsheet row definition    Chronic tremors seeing neurology   COPD on Breztri  rec 2 puffs BID and albuterol  rescue inhaler PRN. Saw pulm Dr Malka - started Ohtuvayre  nebulizer + CT scan.  Referred to speech therapy for swallow study to evaluate for dysphagia contributor to choking/coughing during meals.  He was unable to get speech therapy evaluation completed due to abnormal vital signs  Recommended home oxygen  therapy at night and during naps.   Saw cardiology 09/2024 - plan updated echocardiogram, continue 2L O2 at night time.    He has [employer] oxygen  concentrator at home.  This past Sunday  passed out after coughing fit. EMS called, bp  down to 70s systolic treated with IVF at home, he refused further ER care. This was after a dose of metoprolol .   Progressive IDA with positive iFOB - referred to GI - appt still pending   Med rec performed:  Amiodarone  200mg  daily since ~07/2024   Preventative: Colon cancer screening - h/o IDA that resolved with replacement. Last saw GI [facility]) 12/2017 - hesitant at that time for luminal evaluation given comorbidities. Never returned iFOB. He had reassuring abd/pelvic CT. From GI's last note: If he were to consent to a procedure or sedation, further cardiac clearance would be required due his bradycardia. Denies blood in stool.  Prostate cancer treatment - completed radiation therapy ([facility]). Saw last month.  Lung cancer screening - aged out. PAH on chest CT 01/2018.  Flu shot - declined COVID vaccine - Pfizer 02/2020, 03/2020 Prevnar-20 09/2021 Td 04/2020  Shingrix - declines  Advanced directive discussion - working on this at home. Wife would be HCPOA. Encouraged he finish filling out.  Seat belt use discussed Sunscreen use discussed, no changing moles.  Smoker - <1 ppd longterm smoker (53  yrs) - chantix  didn't help (vivid dreams).  Alcohol - rare  Dentist - has upper dentures, lost lower dentures - no luck getting fitted again despite multiple attempts  Eye exam - Dr Carolee 08/17/2023. S/p cataract surgery by Dr Austin Bowel - no constipation  Bladder - no incontinence but does not urinary urgency, uses bottle urinal at night   Caffeine: 1 cup coffee, 1/2 gallon unsweet tea, 2 soda/day Lives with wife and 1 dog, 6 cats outside. 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     Relevant past medical, surgical, family and social history reviewed and updated as indicated. Interim medical history since our last visit reviewed. Allergies and medications reviewed and updated. Outpatient Medications Prior to Visit  Medication Sig  Dispense Refill   aspirin  81 MG EC tablet Take 1 tablet (81 mg total) by mouth daily. Swallow whole.     Budeson-Glycopyrrol-Formoterol (BREZTRI  AEROSPHERE) 160-9-4.8 MCG/ACT AERO Inhale 2 puffs into the lungs in the morning and at bedtime. 3 each 3   Cholecalciferol  (VITAMIN D ) 50 MCG (2000 UT) CAPS Take 1 capsule (2,000 Units total) by mouth daily. 30 capsule    Coenzyme Q10 (COQ-10 PO) Take 1 tablet by mouth daily.     Cyanocobalamin  (B-12) 1000 MCG SUBL Place 1 tablet under the tongue every Monday, Wednesday, and Friday. 30 tablet    diclofenac  Sodium (VOLTAREN ) 1 % GEL Apply 4 g topically 3 (three) times daily. As needed 100 g 3   diltiazem  (CARDIZEM ) 30 MG tablet TAKE 1 TABLET(30 MG) BY MOUTH DAILY AS NEEDED FOR FAST HEART RATE 30 tablet 3   Ferrous Sulfate  (IRON) 325 (65 Fe) MG TABS Take by mouth daily. (Patient taking differently: Take by mouth daily as needed.)     fish oil-omega-3 fatty acids 1000 MG capsule Take 2 g by mouth daily.     furosemide  (LASIX ) 20 MG tablet TAKE 1 TABLET BY MOUTH EVERY DAY AS NEEDED FOR FLUID RETENTION 90 tablet 1   gabapentin  (NEURONTIN ) 600 MG tablet Take 1 tablet (600 mg total) by mouth 2 (two) times daily. 180 tablet 1   HYDROcodone -acetaminophen  (NORCO) 10-325 MG tablet Take 1 tablet by mouth every 6 (six) hours as needed for moderate pain (pain score 4-6) or severe pain (pain score 7-10). 120 tablet 0   ipratropium-albuterol  (DUONEB) 0.5-2.5 (3) MG/3ML SOLN Take 3 mLs by nebulization every 6 (six) hours as needed. 360 mL 6   levalbuterol  (XOPENEX  HFA) 45 MCG/ACT inhaler Inhale 2 puffs into the lungs every 4 (four) hours as needed for wheezing. 1 each 2   loratadine  (CLARITIN ) 10 MG tablet Take 1 tablet (10 mg total) by mouth daily.     metFORMIN  (GLUCOPHAGE -XR) 500 MG 24 hr tablet TAKE 1 TABLET(500 MG) BY MOUTH TWICE DAILY WITH A MEAL 180 tablet 4   midodrine  (PROAMATINE ) 10 MG tablet Take 1 tablet (10 mg total) by mouth 3 (three) times daily as needed. 90  tablet 1   nitroGLYCERIN  (NITROLINGUAL ) 0.4 MG/SPRAY spray USE 1 SPRAY AS DIRECTED EVERY 5 MINUTES AS NEEDED 4.9 g 1   OXYGEN  Inhale 3 L into the lungs at bedtime.     spironolactone  (ALDACTONE ) 25 MG tablet TAKE 1/2 TABLET(12.5 MG) BY MOUTH DAILY 45 tablet 3   tamsulosin  (FLOMAX ) 0.4 MG CAPS capsule Take 1 capsule (0.4 mg total) by mouth daily. 30 capsule 11   trimethoprim  (TRIMPEX ) 100 MG tablet Take 1 tablet (100 mg total) by  mouth daily. 30 tablet 11   warfarin (COUMADIN ) 2.5 MG tablet TAKE 1 TABLET TO 1 AND 1/2 TABLETS BY MOUTH DAILY AS DIRECTED AFTER ANTI-COAG CLINIC 120 tablet 0   albuterol  (VENTOLIN  HFA) 108 (90 Base) MCG/ACT inhaler INHALE 2 PUFFS INTO THE LUNGS EVERY 6 HOURS AS NEEDED FOR WHEEZING OR SHORTNESS OF BREATH 18 g 3   amiodarone  (PACERONE ) 200 MG tablet TAKE 1 TABLET BY MOUTH AS NEEDED FOR HEART RATE> 100 60 tablet 8   diphenoxylate -atropine  (LOMOTIL ) 2.5-0.025 MG tablet Take 1 tablet by mouth 2 (two) times daily as needed. 20 tablet 0   methocarbamol  (ROBAXIN ) 500 MG tablet TAKE 1 TABLET(500 MG) BY MOUTH EVERY 8 HOURS AS NEEDED FOR MUSCLE SPASMS 180 tablet 1   metoprolol  tartrate (LOPRESSOR ) 25 MG tablet Take 0.5 tablets (12.5 mg total) by mouth daily as needed (fast heart rate).     omeprazole  (PRILOSEC) 40 MG capsule Take 1 capsule (40 mg total) by mouth daily. 90 capsule 0   pravastatin  (PRAVACHOL ) 20 MG tablet Take 1 tablet (20 mg total) by mouth daily. 90 tablet 0   amiodarone  (PACERONE ) 200 MG tablet Take 1 tablet (200 mg total) by mouth daily.     No facility-administered medications prior to visit.     Per HPI unless specifically indicated in ROS section below Review of Systems  Objective:  BP 122/84   Pulse (!) 58   Temp (!) 97.5 F (36.4 C) (Oral)   Ht 6' (1.829 m)   Wt 225 lb 8 oz (102.3 kg)   SpO2 95% Comment: Pt on 2L SPO2  BMI 30.58 kg/m   Wt Readings from Last 3 Encounters:  01/04/25 225 lb 8 oz (102.3 kg)  12/21/24 234 lb (106.1 kg)  12/16/24  223 lb (101.2 kg)      Physical Exam Vitals and nursing note reviewed.  Constitutional:      Appearance: Normal appearance. He is not ill-appearing.     Comments: Sitting in wheelchair   HENT:     Head: Normocephalic and atraumatic.     Mouth/Throat:     Mouth: Mucous membranes are moist.     Pharynx: Oropharynx is clear. No oropharyngeal exudate or posterior oropharyngeal erythema.  Eyes:     Extraocular Movements: Extraocular movements intact.     Pupils: Pupils are equal, round, and reactive to light.  Cardiovascular:     Rate and Rhythm: Normal rate and regular rhythm.     Pulses: Normal pulses.     Heart sounds: Normal heart sounds. No murmur heard. Pulmonary:     Effort: Pulmonary effort is normal. No respiratory distress.     Breath sounds: Normal breath sounds. No wheezing, rhonchi or rales.  Abdominal:     General: There is no distension.     Palpations: Abdomen is soft.     Tenderness: There is no abdominal tenderness.  Musculoskeletal:     Cervical back: Normal range of motion and neck supple.     Right lower leg: No edema.     Left lower leg: No edema.  Skin:    General: Skin is warm and dry.     Findings: No rash.  Neurological:     Mental Status: He is alert.     Motor: Tremor present.     Comments: Intermittent resting, worse postural   Psychiatric:        Mood and Affect: Mood normal.        Behavior: Behavior normal.  Results for orders placed or performed in visit on 12/16/24  CBC with Differential/Platelet   Collection Time: 12/16/24  1:35 PM  Result Value Ref Range   WBC 5.1 4.0 - 10.5 K/uL   RBC 3.44 (L) 4.22 - 5.81 Mil/uL   Hemoglobin 9.0 (L) 13.0 - 17.0 g/dL   HCT 70.7 (L) 60.9 - 47.9 %   MCV 84.9 78.0 - 100.0 fl   MCHC 30.9 30.0 - 36.0 g/dL   RDW 83.6 (H) 88.4 - 84.4 %   Platelets 214.0 150.0 - 400.0 K/uL   Neutrophils Relative % 72.9 43.0 - 77.0 %   Lymphocytes Relative 18.8 12.0 - 46.0 %   Monocytes Relative 5.9 3.0 - 12.0 %    Eosinophils Relative 1.7 0.0 - 5.0 %   Basophils Relative 0.7 0.0 - 3.0 %   Neutro Abs 3.7 1.4 - 7.7 K/uL   Lymphs Abs 1.0 0.7 - 4.0 K/uL   Monocytes Absolute 0.3 0.1 - 1.0 K/uL   Eosinophils Absolute 0.1 0.0 - 0.7 K/uL   Basophils Absolute 0.0 0.0 - 0.1 K/uL  Comprehensive metabolic panel with GFR   Collection Time: 12/16/24  1:35 PM  Result Value Ref Range   Sodium 140 135 - 145 mEq/L   Potassium 4.3 3.5 - 5.1 mEq/L   Chloride 104 96 - 112 mEq/L   CO2 31 19 - 32 mEq/L   Glucose, Bld 232 (H) 70 - 99 mg/dL   BUN 12 6 - 23 mg/dL   Creatinine, Ser 8.95 0.40 - 1.50 mg/dL   Total Bilirubin 0.4 0.2 - 1.2 mg/dL   Alkaline Phosphatase 81 39 - 117 U/L   AST 13 5 - 37 U/L   ALT 12 3 - 53 U/L   Total Protein 6.1 6.0 - 8.3 g/dL   Albumin 3.6 3.5 - 5.2 g/dL   GFR 32.23 >39.99 mL/min   Calcium  8.5 8.4 - 10.5 mg/dL  Ferritin   Collection Time: 12/16/24  1:35 PM  Result Value Ref Range   Ferritin 11.8 (L) 22.0 - 322.0 ng/mL  IBC panel   Collection Time: 12/16/24  1:35 PM  Result Value Ref Range   Iron 24 (L) 42 - 165 ug/dL   Transferrin 690.9 787.9 - 360.0 mg/dL   Saturation Ratios 5.5 (L) 20.0 - 50.0 %   TIBC 432.6 250.0 - 450.0 mcg/dL   *Note: Due to a large number of results and/or encounters for the requested time period, some results have not been displayed. A complete set of results can be found in Results Review.   Lab Results  Component Value Date   HGBA1C 6.8 (A) 06/27/2024    Lab Results  Component Value Date   CHOL 130 09/15/2023   HDL 31.80 (L) 09/15/2023   LDLCALC 64 09/15/2023   LDLDIRECT 74.0 09/15/2022   TRIG 170.0 (H) 09/15/2023   CHOLHDL 4 09/15/2023   Lab Results  Component Value Date   TSH 4.03 09/26/2024   Lab Results  Component Value Date   25OHVITD2 <1.0 09/07/2017   25OHVITD3 28 09/07/2017   VD25OH 26.01 (L) 09/15/2023    Lab Results  Component Value Date   VITAMINB12 777 09/15/2023    Assessment & Plan:   Problem List Items Addressed This  Visit     Hyperlipidemia   Relevant Medications   amiodarone  (PACERONE ) 200 MG tablet   pravastatin  (PRAVACHOL ) 20 MG tablet   Other Relevant Orders   Lipid panel   Atrial fibrillation (HCC)   Relevant  Medications   amiodarone  (PACERONE ) 200 MG tablet   pravastatin  (PRAVACHOL ) 20 MG tablet   Other Relevant Orders   CBC with Differential/Platelet   Type 2 diabetes mellitus with diabetic neuropathy (HCC)   Relevant Medications   pravastatin  (PRAVACHOL ) 20 MG tablet   Other Relevant Orders   Hemoglobin A1c   Vitamin D  insufficiency - Primary   Relevant Orders   VITAMIN D  25 Hydroxy (Vit-D Deficiency, Fractures)     Meds ordered this encounter  Medications   pravastatin  (PRAVACHOL ) 20 MG tablet    Sig: Take 1 tablet (20 mg total) by mouth daily.    Dispense:  90 tablet    Refill:  3   omeprazole  (PRILOSEC) 40 MG capsule    Sig: Take 1 capsule (40 mg total) by mouth daily.    Dispense:  90 capsule    Refill:  3    Orders Placed This Encounter  Procedures   Lipid panel   Hemoglobin A1c   VITAMIN D  25 Hydroxy (Vit-D Deficiency, Fractures)   CBC with Differential/Platelet    Patient Instructions  Stop methocarbamol  muscle relaxant Hold on metoprolol  at this time.  Stop albuterol   - use levalbuterol  in its place   Call for appointment with [facility]  [facility] [address] [place] [address] [phone]    Follow up plan: Return in about 1 month (around 02/04/2025) for follow up visit.  Anton Blas, MD   "

## 2025-01-04 NOTE — Patient Instructions (Addendum)
 Stop methocarbamol  muscle relaxant Hold on metoprolol  at this time.  Stop albuterol   - use levalbuterol  in its place   Call for appointment with [facility]  [facility] [address] [place] [address] [phone]

## 2025-01-05 ENCOUNTER — Encounter: Payer: Self-pay | Admitting: Family Medicine

## 2025-01-05 DIAGNOSIS — Z95828 Presence of other vascular implants and grafts: Secondary | ICD-10-CM | POA: Insufficient documentation

## 2025-01-05 DIAGNOSIS — I4891 Unspecified atrial fibrillation: Secondary | ICD-10-CM | POA: Insufficient documentation

## 2025-01-05 LAB — CBC WITH DIFFERENTIAL/PLATELET
Basophils Absolute: 0 10*3/uL (ref 0.0–0.1)
Basophils Relative: 0.5 % (ref 0.0–3.0)
Eosinophils Absolute: 0.1 10*3/uL (ref 0.0–0.7)
Eosinophils Relative: 1.3 % (ref 0.0–5.0)
HCT: 29.4 % — ABNORMAL LOW (ref 39.0–52.0)
Hemoglobin: 9.3 g/dL — ABNORMAL LOW (ref 13.0–17.0)
Lymphocytes Relative: 16.9 % (ref 12.0–46.0)
Lymphs Abs: 1 10*3/uL (ref 0.7–4.0)
MCHC: 31.8 g/dL (ref 30.0–36.0)
MCV: 85 fl (ref 78.0–100.0)
Monocytes Absolute: 0.4 10*3/uL (ref 0.1–1.0)
Monocytes Relative: 6 % (ref 3.0–12.0)
Neutro Abs: 4.5 10*3/uL (ref 1.4–7.7)
Neutrophils Relative %: 75.3 % (ref 43.0–77.0)
Platelets: 259 10*3/uL (ref 150.0–400.0)
RBC: 3.46 Mil/uL — ABNORMAL LOW (ref 4.22–5.81)
RDW: 18.5 % — ABNORMAL HIGH (ref 11.5–15.5)
WBC: 6 10*3/uL (ref 4.0–10.5)

## 2025-01-05 LAB — LIPID PANEL
Cholesterol: 119 mg/dL (ref 28–200)
HDL: 41.8 mg/dL
LDL Cholesterol: 60 mg/dL (ref 10–99)
NonHDL: 76.93
Total CHOL/HDL Ratio: 3
Triglycerides: 87 mg/dL (ref 10.0–149.0)
VLDL: 17.4 mg/dL (ref 0.0–40.0)

## 2025-01-05 LAB — VITAMIN D 25 HYDROXY (VIT D DEFICIENCY, FRACTURES): VITD: 37.69 ng/mL (ref 30.00–100.00)

## 2025-01-05 LAB — HEMOGLOBIN A1C: Hgb A1c MFr Bld: 7.3 % — ABNORMAL HIGH (ref 4.6–6.5)

## 2025-01-05 NOTE — Assessment & Plan Note (Signed)
 Doing better since starting ppx suppressive tmp 100mg  daily, and restarting flomax  0.4mg  nightly - ok to continue this.  Monitor orthostasis on flomax .

## 2025-01-05 NOTE — Assessment & Plan Note (Signed)
 Lab Results  Component Value Date   INR 3.8 (A) 12/14/2024   INR 2.2 11/02/2024   INR 1.8 (A) 09/21/2024  Followed by cardiology coumadin  clinic  Will see if we can add INR to blood drawn today

## 2025-01-05 NOTE — Assessment & Plan Note (Signed)
 This may be contributing to recurrent syncope. Has been hesitant for pacemaker discussion.  Keep cards f/u next week.  Continue midodrine  10mg  tid PRN hypotension.

## 2025-01-05 NOTE — Assessment & Plan Note (Signed)
 Update levels on vit D 1000 units daily.

## 2025-01-05 NOTE — Assessment & Plan Note (Signed)
 Chronic, on pravastatin  20mg  daily - update FLP. The ASCVD Risk score (Arnett DK, et al., 2019) failed to calculate for the following reasons:   The 2019 ASCVD risk score is only valid for ages 44 to 63   * - Cholesterol units were assumed

## 2025-01-05 NOTE — Assessment & Plan Note (Signed)
 Chronic issue - with hypoxia on presentation, improved with 2L O2 via [place].  Rec more regular supplemental oxygen  use at home  Wife Donny will ask for O2 concentrator servicing from [employer] as pt feels it is not working well.

## 2025-01-05 NOTE — Assessment & Plan Note (Addendum)
 Chronic afib with slowed ventricular response.  Continues coumadin  and aspirin .  Now taking amiodarone  200mg  daily.  No recent dilt 30mg  PRN use.

## 2025-01-05 NOTE — Assessment & Plan Note (Signed)
 Appreciate cardiology care -continue aspirin , statin.

## 2025-01-05 NOTE — Assessment & Plan Note (Signed)
 Widely fluctuating blood pressures - high to low.  He is on spironolactone  12.5mg  daily and lasix  20mg  PRN, as well as has midodrine  10mg  TID PRN low blood pressures.  Will continue to monitor.

## 2025-01-05 NOTE — Assessment & Plan Note (Signed)
 S/p radiation treatment 2019  Released from rad onc care ([facility]) 08/2023 - rec yearly PSA and uro f/u if trending up.  Will see if we can add PSA to lab drawn today.

## 2025-01-05 NOTE — Assessment & Plan Note (Signed)
 CPAP intolerance. Continue nightly O2 use.

## 2025-01-05 NOTE — Assessment & Plan Note (Signed)
[place] CSRS reviewed  ?

## 2025-01-05 NOTE — Assessment & Plan Note (Addendum)
 Chronic, well controlled on metformin  XR 500mg  twice daily.  Also on gabapentin  600mg  bid.  Update A1c.

## 2025-01-05 NOTE — Assessment & Plan Note (Signed)
 Seems euvolemic today. Continue PRN lasix , spironolactone  12.5mg  daily.

## 2025-01-05 NOTE — Addendum Note (Signed)
 Addended by: HOPE VEVA PARAS on: 01/05/2025 11:24 AM   Modules accepted: Orders

## 2025-01-05 NOTE — Assessment & Plan Note (Signed)
 S/p endovascular repair 2012 [facility]) then 4v fenestrated repair of TAAA 2018 Jayne at [facility]). Last saw [facility] 2021 - overdue. New referral placed.

## 2025-01-05 NOTE — Assessment & Plan Note (Addendum)
 Latest syncopal episode was last week in setting of coughing fit but also with h/o orthostasis and had just taken dose of metoprolol . Now has midodrine  to use PRN hypotension. Will stop metoprolol .  H/o sick sinus syndrome, not interested in pacemaker placement.  Keep cardiology appt next week.

## 2025-01-05 NOTE — Assessment & Plan Note (Addendum)
 Minimal ambulatio - has power scooter at home.  In wheelchair today.  Stop methocarbamol , minimize medications that could contribute to dizziness - will consider titration of gabapentin , hydrocodone , flomax  next.

## 2025-01-05 NOTE — Assessment & Plan Note (Signed)
 Persistent iron deficiency with progresive anemia despite daily oral iron replacement, recent iFOB positive. Comorbidities limit options for luminal evaluation.  Update CBC.  Pending GI evaluation.

## 2025-01-05 NOTE — Assessment & Plan Note (Signed)
 Chronic COPD with hypoxia, continued smoker.  Appreciate pulm care  Pending updated chest CT Continue Breztri  2 puffs bid, with levalbuterol  rescue inhaler PRN, duonebs PRN.  Recent start Ohtuvayre  nebulizers - pending insurance /PAP approval.

## 2025-01-05 NOTE — Assessment & Plan Note (Signed)
 Chronic issue. Appreciate neurology care - next appt is next month.  On gabapentin  600mg  bid with limited benefit, notes worsening dry mouth since increasing dose.

## 2025-01-05 NOTE — Assessment & Plan Note (Signed)
 Chronic opiate dependence hydrocodone  10/325mg  QID.  See above.

## 2025-01-05 NOTE — Assessment & Plan Note (Addendum)
 On aspirin  and coumadin . See above.

## 2025-01-05 NOTE — Assessment & Plan Note (Signed)
This is better.  

## 2025-01-05 NOTE — Assessment & Plan Note (Deleted)
 Persistent iron deficiency with progresive anemia despite daily oral iron replacement, recent iFOB positive. Comorbidities limit options for luminal evaluation.  Update CBC.  Pending GI evaluation.

## 2025-01-06 ENCOUNTER — Other Ambulatory Visit: Payer: Self-pay | Admitting: Family Medicine

## 2025-01-07 ENCOUNTER — Ambulatory Visit: Payer: Self-pay | Admitting: Family Medicine

## 2025-01-08 NOTE — Telephone Encounter (Signed)
 Copied from CRM #[id number]. Topic: Clinical - Medication Question >> Jan 06, 2025  4:01 PM Joesph PARAS wrote: Reason for CRM: [facility] is calling and states copayment for [facility] for $590.12 and patient is unable to meet that cost, and states they will reach out to [facility] for financial assistance. States they contacted [facility], then wanted to try [facility] and after learning copayment cost, decided to go back to [facility].

## 2025-01-11 ENCOUNTER — Encounter: Payer: Self-pay | Admitting: Medical

## 2025-01-11 ENCOUNTER — Ambulatory Visit

## 2025-01-11 ENCOUNTER — Ambulatory Visit: Admitting: Medical

## 2025-01-11 VITALS — BP 100/60 | HR 52 | Ht 72.0 in | Wt 227.5 lb

## 2025-01-11 DIAGNOSIS — Z720 Tobacco use: Secondary | ICD-10-CM | POA: Diagnosis not present

## 2025-01-11 DIAGNOSIS — R55 Syncope and collapse: Secondary | ICD-10-CM | POA: Diagnosis not present

## 2025-01-11 DIAGNOSIS — I5032 Chronic diastolic (congestive) heart failure: Secondary | ICD-10-CM | POA: Diagnosis not present

## 2025-01-11 DIAGNOSIS — I714 Abdominal aortic aneurysm, without rupture, unspecified: Secondary | ICD-10-CM | POA: Diagnosis not present

## 2025-01-11 DIAGNOSIS — J449 Chronic obstructive pulmonary disease, unspecified: Secondary | ICD-10-CM | POA: Diagnosis not present

## 2025-01-11 DIAGNOSIS — I4891 Unspecified atrial fibrillation: Secondary | ICD-10-CM

## 2025-01-11 DIAGNOSIS — E782 Mixed hyperlipidemia: Secondary | ICD-10-CM

## 2025-01-11 DIAGNOSIS — Z5181 Encounter for therapeutic drug level monitoring: Secondary | ICD-10-CM

## 2025-01-11 LAB — POCT INR: INR: 2.5 (ref 2.0–3.0)

## 2025-01-11 MED ORDER — METOPROLOL SUCCINATE ER 25 MG PO TB24: 12.5000 mg | ORAL_TABLET | Freq: Every day | ORAL | 3 refills | Status: AC

## 2025-01-11 MED ORDER — MIDODRINE HCL 10 MG PO TABS: 10.0000 mg | ORAL_TABLET | Freq: Three times a day (TID) | ORAL | 2 refills | Status: AC | PRN

## 2025-01-11 MED ORDER — WARFARIN SODIUM 2.5 MG PO TABS: ORAL_TABLET | ORAL | 1 refills | Status: AC

## 2025-01-11 NOTE — Patient Instructions (Signed)
 continue 1 tablet daily, EXCEPT 1.5 TABLETS ON Mondays, Wednesdays and Fridays Recheck INR in 6 weeks [phone]

## 2025-01-11 NOTE — Progress Notes (Signed)
 " Cardiology Office Note   Date:  01/11/2025  ID:  [name], [name] 10-21-1946, MRN [id number] PCP: Rilla Baller, MD  [facility] Providers Cardiologist:  None   History of Present Illness [name] is a 79 y.o. male  with a hx of COPD, obesity, poorly controlled diabetes, smoking history, chronic HFpEF, paroxysmal A-fib, on occasion nonadherence, prior back injury, AAA managed at [facility], chronotropic incompetence who presents for 43-month follow-up.   Echo in 2012 showed EF 55 to 65%, moderate LVH, grade 1 diastolic dysfunction, mildly dilated left atrium.  Prior stress test in 2012 with report unable to review.  Patient has been previously evaluated by EP for chronotropic incompetence, tachybradycardia syndrome with the patient refusing pacemaker implantation.  Patient has a long history of taking medications at his own discretion and does not allow for directions of his prescriptions. Echo November 2021 showed EF of 50%, moderate LVH, aortic root 41 mm ascending aorta 37 mm.  Patient was seen in August 2023 and it was recommended he stop his amiodarone  given his asymptomatic A-fib.  The patient was last seen 10/07/24 reporting syncope while coughing. BP was low, he was taking midodrine  as needed.echo was ordered for SOB. Echo showed LVEF 55-60%, G1DD, borderline dilation of the aortic root, measuring 38mm, mild dilation of the ascending aorta measuring 40mm.   Today, the patient reports occasional syncope. He still has tremors. He was given metoprolol  and it caused his BP to drop and started coughing and he passed out. They called the paramedics and noted BP was low with systolics in the 70s. He was given IVF with improvement. Since then he has not received any more metoprolol . Since then he was been doing well. He ha occasional high blood pressures. He takes lasix  daily.  He eats, but has low appetite. No lower leg edema or chest pain. He has some SOB from COPD and anemia. He needs  refills of diltiazem .  Studies Reviewed EKG Interpretation Date/Time:  Wednesday January 11 2025 13:36:15 EST Ventricular Rate:  52 PR Interval:  178 QRS Duration:  98 QT Interval:  480 QTC Calculation: 446 R Axis:   -30  Text Interpretation: Sinus bradycardia Left axis deviation When compared with ECG of 07-Oct-2024 15:28, Premature supraventricular complexes are no longer Present Confirmed by Franchester, Rochella Benner ([id number]) on 01/11/2025 1:38:44 PM    Echo 11/2024  1. Left ventricular ejection fraction, by estimation, is 55 to 60%. The  left ventricle has normal function. Left ventricular endocardial border  not optimally defined to evaluate regional wall motion. The left  ventricular internal cavity size was not  well visualized. Left ventricular diastolic parameters are consistent with  Grade I diastolic dysfunction (impaired relaxation).   2. Right ventricular systolic function is normal. The right ventricular  size is normal.   3. The mitral valve is degenerative. Trivial mitral valve regurgitation.  No evidence of mitral stenosis.   4. The aortic valve was not well visualized. Aortic valve regurgitation  is mild to moderate.   5. Aortic dilatation noted. There is borderline dilatation of the aortic  root, measuring 38 mm. There is mild dilatation of the ascending aorta,  measuring 40 mm.   Echo 10/2020  1. Left ventricular ejection fraction, by estimation, is 50 %. The left  ventricle has low normal function. The left ventricle has no regional wall  motion abnormalities. There is moderate left ventricular hypertrophy. The  average left ventricular global  longitudinal strain is -2.3 %. The  global longitudinal strain is abnormal.   2. Right ventricular systolic function is normal. The right ventricular  size is normal.   3. Left atrial size was mildly dilated.   4. The mitral valve is normal in structure. Mild mitral valve  regurgitation.   5. There is mild dilatation of the  aortic root, measuring 41 mm. There is  borderline dilatation of the ascending aorta, measuring 37 mm.    Echo 12/2018 Study Conclusions   - Left ventricle: The cavity size was normal. There was mild    concentric hypertrophy. Systolic function was normal. The    estimated ejection fraction was in the range of 55% to 60%. Wall    motion was normal; there were no regional wall motion    abnormalities. The study is not technically sufficient to allow    evaluation of LV diastolic function.  - Aorta: Aortic root dimension: 42 mm (ED).  - Aortic root: The aortic root was mildly dilated.  - Left atrium: The atrium was mildly to moderately dilated.  - Pulmonary arteries: Systolic pressure could not be accurately    estimated.    Heart monitor 06/2019 Event Monitor   Normal sinus rhythm,  Atrial Fibrillation occurred (32% burden), ranging from 40-138 bpm (avg of 71 bpm),  the longest lasting 17 hours 44 mins with an avg rate of 68 bpm.    Avg HR of 55 bpm. Predominant heart rate in NSR is 40s to 55 bpm Avg heart rate in atrial fibrillation is 60 to 90 bpm   Isolated SVEs were rare (<1.0%), SVE Couplets were rare (<1.0%), and SVE Triplets were rare (<1.0%). Isolated VEs were rare (<1.0%), VE Couplets were rare (<1.0%), and no VE Triplets were present.    Signed, Velinda Lunger, MD, Ph.D [facility]      Physical Exam VS:  BP 100/60 (BP Location: Left Arm, Patient Position: Sitting, Cuff Size: Normal)   Pulse (!) 52   Ht 6' (1.829 m)   Wt 227 lb 8 oz (103.2 kg)   SpO2 92%   BMI 30.85 kg/m        Wt Readings from Last 3 Encounters:  01/11/25 227 lb 8 oz (103.2 kg)  01/04/25 225 lb 8 oz (102.3 kg)  12/21/24 234 lb (106.1 kg)    GEN: Well nourished, well developed in no acute distress NECK: No JVD; No carotid bruits CARDIAC: RRR, no murmurs, rubs, gallops RESPIRATORY:  +rhonchi  ABDOMEN: Soft, non-tender, non-distended EXTREMITIES:  No edema; No deformity   ASSESSMENT AND  PLAN  Chronic HFpEF Echo showed normal EF and G1DD. He appears euvolemic. He takes lasix  20mg  and spironolactone  12.5mg  daily.   Syncope H/o hypotension/orthostasis Had 1 syncopal episode after taking metoprolol  for tremors and a coughing episode. He passed out while sitting and EMS was called, who found his BP to be low. He was given IVF with improvement and he was not taken to the ER. Since then he has not been given metoprolol . He has not needed midodrine . I will send in refills of midodrine .  Paroxysmal Afib SSS/chronotropic incompetence He previously declined pacemaker. EKG shows SB with HR of 52bpm. Continue amiodarone  200mg  daily. Warfarin monitor by the coumadin  clinic. I will send in refills of warfarin.  AAA No further follow-up per [facility], per patient report  Tobacco use COPD He is still smoking 1/2 ppd. He is following with pulmonology.   HLD LDL 60. Continue Pravastatin  20mg  daily.        Dispo:  Follow-up in 6 months  Signed, Alysa Duca VEAR Fishman, PA-C   "

## 2025-01-11 NOTE — Patient Instructions (Signed)
 Medication Instructions:   Your physician recommends the following medication changes.  START TAKING: Metoprolol  Succinate (Toprol  XL) 12.5 mg (1/2 of 25 mg tablet) once daily with meals.   *If you need a refill on your cardiac medications before your next appointment, please call your pharmacy*  Lab Work:  None ordered at this time   If you have labs (blood work) drawn today and your tests are completely normal, you will receive your results only by:  MyChart Message (if you have MyChart) OR  A paper copy in the mail If you have any lab test that is abnormal or we need to change your treatment, we will call you to review the results.  Testing/Procedures:  None ordered at this time   Referrals:  None ordered at this time   Follow-Up:  At [facility], you and your health needs are our priority.  As part of our continuing mission to provide you with exceptional heart care, our providers are all part of one team.  This team includes your primary Cardiologist (physician) and Advanced Practice Providers or APPs (Physician Assistants and Nurse Practitioners) who all work together to provide you with the care you need, when you need it.  Your next appointment:   5 - 6 month(s)  Provider:    Evalene Lunger, MD or Cadence Furth, PA-C    We recommend signing up for the patient portal called MyChart.  Sign up information is provided on this After Visit Summary.  MyChart is used to connect with patients for Virtual Visits (Telemedicine).  Patients are able to view lab/test results, encounter notes, upcoming appointments, etc.  Non-urgent messages can be sent to your provider as well.   To learn more about what you can do with MyChart, go to [email or web address].

## 2025-01-12 ENCOUNTER — Encounter: Payer: Self-pay | Admitting: Gastroenterology

## 2025-02-07 ENCOUNTER — Ambulatory Visit: Admitting: Gastroenterology

## 2025-02-20 ENCOUNTER — Ambulatory Visit: Admitting: Urology

## 2025-02-22 ENCOUNTER — Ambulatory Visit

## 2025-04-12 ENCOUNTER — Ambulatory Visit: Admitting: Pulmonary Disease

## 2025-06-06 ENCOUNTER — Ambulatory Visit: Admitting: Medical

## 2025-09-28 ENCOUNTER — Ambulatory Visit

## 2025-09-29 ENCOUNTER — Ambulatory Visit
